# Patient Record
Sex: Female | Born: 1940
Health system: Southern US, Community
[De-identification: ages and names within clinical notes are randomized; demographics above are authoritative.]

## PROBLEM LIST (undated history)

## (undated) DIAGNOSIS — F329 Major depressive disorder, single episode, unspecified: Secondary | ICD-10-CM

## (undated) DIAGNOSIS — I10 Essential (primary) hypertension: Secondary | ICD-10-CM

## (undated) DIAGNOSIS — Z8709 Personal history of other diseases of the respiratory system: Secondary | ICD-10-CM

## (undated) DIAGNOSIS — G4733 Obstructive sleep apnea (adult) (pediatric): Secondary | ICD-10-CM

## (undated) DIAGNOSIS — M545 Low back pain, unspecified: Secondary | ICD-10-CM

## (undated) DIAGNOSIS — Z86018 Personal history of other benign neoplasm: Secondary | ICD-10-CM

## (undated) DIAGNOSIS — J449 Chronic obstructive pulmonary disease, unspecified: Secondary | ICD-10-CM

## (undated) DIAGNOSIS — Z7901 Long term (current) use of anticoagulants: Secondary | ICD-10-CM

## (undated) DIAGNOSIS — F32A Depression, unspecified: Secondary | ICD-10-CM

## (undated) DIAGNOSIS — E039 Hypothyroidism, unspecified: Secondary | ICD-10-CM

## (undated) DIAGNOSIS — I739 Peripheral vascular disease, unspecified: Secondary | ICD-10-CM

## (undated) DIAGNOSIS — K449 Diaphragmatic hernia without obstruction or gangrene: Secondary | ICD-10-CM

## (undated) DIAGNOSIS — G8929 Other chronic pain: Secondary | ICD-10-CM

## (undated) DIAGNOSIS — I714 Abdominal aortic aneurysm, without rupture, unspecified: Secondary | ICD-10-CM

## (undated) DIAGNOSIS — R0600 Dyspnea, unspecified: Secondary | ICD-10-CM

## (undated) DIAGNOSIS — IMO0002 Reserved for concepts with insufficient information to code with codable children: Secondary | ICD-10-CM

## (undated) DIAGNOSIS — IMO0001 Reserved for inherently not codable concepts without codable children: Secondary | ICD-10-CM

## (undated) DIAGNOSIS — R0609 Other forms of dyspnea: Secondary | ICD-10-CM

## (undated) DIAGNOSIS — Z87898 Personal history of other specified conditions: Secondary | ICD-10-CM

## (undated) DIAGNOSIS — C3490 Malignant neoplasm of unspecified part of unspecified bronchus or lung: Secondary | ICD-10-CM

## (undated) DIAGNOSIS — I82409 Acute embolism and thrombosis of unspecified deep veins of unspecified lower extremity: Secondary | ICD-10-CM

## (undated) DIAGNOSIS — M1990 Unspecified osteoarthritis, unspecified site: Secondary | ICD-10-CM

## (undated) DIAGNOSIS — F419 Anxiety disorder, unspecified: Secondary | ICD-10-CM

## (undated) DIAGNOSIS — N838 Other noninflammatory disorders of ovary, fallopian tube and broad ligament: Secondary | ICD-10-CM

## (undated) DIAGNOSIS — K224 Dyskinesia of esophagus: Secondary | ICD-10-CM

## (undated) DIAGNOSIS — E785 Hyperlipidemia, unspecified: Secondary | ICD-10-CM

## (undated) HISTORY — DX: Acute embolism and thrombosis of unspecified deep veins of unspecified lower extremity: I82.409

## (undated) HISTORY — DX: Reserved for inherently not codable concepts without codable children: IMO0001

## (undated) HISTORY — DX: Personal history of other benign neoplasm: Z86.018

## (undated) HISTORY — DX: Personal history of other specified conditions: Z87.898

## (undated) HISTORY — PX: THYROIDECTOMY: SHX17

## (undated) HISTORY — DX: Unspecified osteoarthritis, unspecified site: M19.90

## (undated) HISTORY — DX: Major depressive disorder, single episode, unspecified: F32.9

## (undated) HISTORY — DX: Peripheral vascular disease, unspecified: I73.9

## (undated) HISTORY — DX: Anxiety disorder, unspecified: F41.9

## (undated) HISTORY — DX: Obstructive sleep apnea (adult) (pediatric): G47.33

## (undated) HISTORY — DX: Essential (primary) hypertension: I10

## (undated) HISTORY — DX: Chronic obstructive pulmonary disease, unspecified: J44.9

## (undated) HISTORY — PX: ABDOMINAL AORTIC ANEURYSM REPAIR: SUR1152

## (undated) HISTORY — DX: Long term (current) use of anticoagulants: Z79.01

## (undated) HISTORY — PX: REPLACEMENT TOTAL KNEE: SUR1224

## (undated) HISTORY — DX: Depression, unspecified: F32.A

## (undated) HISTORY — PX: BREAST BIOPSY: SHX20

## (undated) HISTORY — DX: Abdominal aortic aneurysm, without rupture, unspecified: I71.40

## (undated) HISTORY — DX: Diaphragmatic hernia without obstruction or gangrene: K44.9

## (undated) HISTORY — DX: Hypothyroidism, unspecified: E03.9

## (undated) HISTORY — DX: Reserved for concepts with insufficient information to code with codable children: IMO0002

## (undated) HISTORY — DX: Hyperlipidemia, unspecified: E78.5

## (undated) HISTORY — DX: Dyskinesia of esophagus: K22.4

## (undated) HISTORY — DX: Abdominal aortic aneurysm, without rupture: I71.4

## (undated) HISTORY — PX: CATARACT EXTRACTION W/ INTRAOCULAR LENS  IMPLANT, BILATERAL: SHX1307

## (undated) HISTORY — DX: Other noninflammatory disorders of ovary, fallopian tube and broad ligament: N83.8

## (undated) HISTORY — PX: BLADDER SURGERY: SHX569

## (undated) HISTORY — DX: Malignant neoplasm of unspecified part of unspecified bronchus or lung: C34.90

---

## 2002-06-23 DIAGNOSIS — C3490 Malignant neoplasm of unspecified part of unspecified bronchus or lung: Secondary | ICD-10-CM

## 2002-06-23 DIAGNOSIS — I82409 Acute embolism and thrombosis of unspecified deep veins of unspecified lower extremity: Secondary | ICD-10-CM

## 2002-06-23 HISTORY — DX: Malignant neoplasm of unspecified part of unspecified bronchus or lung: C34.90

## 2002-06-23 HISTORY — DX: Acute embolism and thrombosis of unspecified deep veins of unspecified lower extremity: I82.409

## 2002-10-10 ENCOUNTER — Encounter: Payer: Self-pay | Admitting: Urology

## 2002-10-11 ENCOUNTER — Ambulatory Visit (HOSPITAL_COMMUNITY): Admission: RE | Admit: 2002-10-11 | Discharge: 2002-10-11 | Payer: Self-pay | Admitting: Urology

## 2002-12-06 ENCOUNTER — Encounter: Payer: Self-pay | Admitting: Internal Medicine

## 2002-12-06 ENCOUNTER — Ambulatory Visit (HOSPITAL_COMMUNITY): Admission: RE | Admit: 2002-12-06 | Discharge: 2002-12-06 | Payer: Self-pay | Admitting: Internal Medicine

## 2003-04-21 ENCOUNTER — Encounter: Admission: RE | Admit: 2003-04-21 | Discharge: 2003-04-21 | Payer: Self-pay | Admitting: Internal Medicine

## 2003-05-01 ENCOUNTER — Inpatient Hospital Stay (HOSPITAL_COMMUNITY): Admission: RE | Admit: 2003-05-01 | Discharge: 2003-05-05 | Payer: Self-pay | Admitting: Internal Medicine

## 2003-05-03 ENCOUNTER — Encounter (INDEPENDENT_AMBULATORY_CARE_PROVIDER_SITE_OTHER): Payer: Self-pay | Admitting: *Deleted

## 2003-05-03 ENCOUNTER — Encounter (INDEPENDENT_AMBULATORY_CARE_PROVIDER_SITE_OTHER): Payer: Self-pay | Admitting: Specialist

## 2003-05-09 ENCOUNTER — Ambulatory Visit: Admission: RE | Admit: 2003-05-09 | Discharge: 2003-06-19 | Payer: Self-pay | Admitting: Radiation Oncology

## 2003-05-15 ENCOUNTER — Ambulatory Visit (HOSPITAL_COMMUNITY): Admission: RE | Admit: 2003-05-15 | Discharge: 2003-05-15 | Payer: Self-pay | Admitting: Oncology

## 2003-05-23 ENCOUNTER — Encounter: Admission: RE | Admit: 2003-05-23 | Discharge: 2003-05-23 | Payer: Self-pay | Admitting: Thoracic Surgery

## 2003-06-07 ENCOUNTER — Ambulatory Visit (HOSPITAL_COMMUNITY): Admission: RE | Admit: 2003-06-07 | Discharge: 2003-06-07 | Payer: Self-pay | Admitting: Oncology

## 2003-07-12 ENCOUNTER — Ambulatory Visit (HOSPITAL_COMMUNITY): Admission: RE | Admit: 2003-07-12 | Discharge: 2003-07-12 | Payer: Self-pay | Admitting: Oncology

## 2003-08-17 ENCOUNTER — Ambulatory Visit (HOSPITAL_COMMUNITY): Admission: RE | Admit: 2003-08-17 | Discharge: 2003-08-17 | Payer: Self-pay | Admitting: Oncology

## 2003-08-22 ENCOUNTER — Ambulatory Visit: Admission: RE | Admit: 2003-08-22 | Discharge: 2003-10-27 | Payer: Self-pay | Admitting: Radiation Oncology

## 2003-10-17 ENCOUNTER — Ambulatory Visit (HOSPITAL_COMMUNITY): Admission: RE | Admit: 2003-10-17 | Discharge: 2003-10-17 | Payer: Self-pay | Admitting: Oncology

## 2004-01-03 ENCOUNTER — Ambulatory Visit (HOSPITAL_COMMUNITY): Admission: RE | Admit: 2004-01-03 | Discharge: 2004-01-03 | Payer: Self-pay | Admitting: Oncology

## 2004-03-29 ENCOUNTER — Ambulatory Visit (HOSPITAL_COMMUNITY): Admission: RE | Admit: 2004-03-29 | Discharge: 2004-03-29 | Payer: Self-pay | Admitting: Oncology

## 2004-04-02 ENCOUNTER — Ambulatory Visit (HOSPITAL_COMMUNITY): Admission: RE | Admit: 2004-04-02 | Discharge: 2004-04-02 | Payer: Self-pay | Admitting: Family Medicine

## 2004-04-12 ENCOUNTER — Ambulatory Visit (HOSPITAL_COMMUNITY): Admission: RE | Admit: 2004-04-12 | Discharge: 2004-04-12 | Payer: Self-pay | Admitting: Oncology

## 2004-05-14 ENCOUNTER — Ambulatory Visit: Admission: RE | Admit: 2004-05-14 | Discharge: 2004-05-14 | Payer: Self-pay

## 2004-05-30 ENCOUNTER — Ambulatory Visit: Admission: RE | Admit: 2004-05-30 | Discharge: 2004-05-30 | Payer: Self-pay | Admitting: Oncology

## 2004-07-04 ENCOUNTER — Ambulatory Visit: Payer: Self-pay | Admitting: Oncology

## 2004-07-09 ENCOUNTER — Ambulatory Visit (HOSPITAL_COMMUNITY): Admission: RE | Admit: 2004-07-09 | Discharge: 2004-07-09 | Payer: Self-pay | Admitting: Oncology

## 2004-09-17 ENCOUNTER — Ambulatory Visit (HOSPITAL_COMMUNITY): Admission: RE | Admit: 2004-09-17 | Discharge: 2004-09-17 | Payer: Self-pay | Admitting: Oncology

## 2004-09-20 ENCOUNTER — Ambulatory Visit: Payer: Self-pay | Admitting: Oncology

## 2004-12-09 ENCOUNTER — Ambulatory Visit (HOSPITAL_COMMUNITY): Admission: RE | Admit: 2004-12-09 | Discharge: 2004-12-09 | Payer: Self-pay | Admitting: Obstetrics and Gynecology

## 2004-12-13 ENCOUNTER — Ambulatory Visit: Payer: Self-pay | Admitting: Oncology

## 2004-12-20 ENCOUNTER — Ambulatory Visit (HOSPITAL_COMMUNITY): Admission: RE | Admit: 2004-12-20 | Discharge: 2004-12-20 | Payer: Self-pay | Admitting: Oncology

## 2005-01-28 ENCOUNTER — Ambulatory Visit: Admission: RE | Admit: 2005-01-28 | Discharge: 2005-01-28 | Payer: Self-pay | Admitting: Oncology

## 2005-02-01 ENCOUNTER — Emergency Department (HOSPITAL_COMMUNITY): Admission: EM | Admit: 2005-02-01 | Discharge: 2005-02-01 | Payer: Self-pay | Admitting: Family Medicine

## 2005-03-14 ENCOUNTER — Ambulatory Visit: Payer: Self-pay | Admitting: Oncology

## 2005-03-17 ENCOUNTER — Ambulatory Visit: Admission: RE | Admit: 2005-03-17 | Discharge: 2005-03-17 | Payer: Self-pay | Admitting: Oncology

## 2005-07-04 ENCOUNTER — Ambulatory Visit: Payer: Self-pay | Admitting: Oncology

## 2005-07-09 ENCOUNTER — Ambulatory Visit (HOSPITAL_COMMUNITY): Admission: RE | Admit: 2005-07-09 | Discharge: 2005-07-09 | Payer: Self-pay | Admitting: Oncology

## 2005-07-17 ENCOUNTER — Ambulatory Visit (HOSPITAL_COMMUNITY): Admission: RE | Admit: 2005-07-17 | Discharge: 2005-07-17 | Payer: Self-pay | Admitting: Oncology

## 2005-08-04 ENCOUNTER — Encounter: Admission: RE | Admit: 2005-08-04 | Discharge: 2005-08-04 | Payer: Self-pay | Admitting: Oncology

## 2005-08-14 ENCOUNTER — Encounter: Admission: RE | Admit: 2005-08-14 | Discharge: 2005-08-14 | Payer: Self-pay | Admitting: Oncology

## 2005-09-02 ENCOUNTER — Encounter (INDEPENDENT_AMBULATORY_CARE_PROVIDER_SITE_OTHER): Payer: Self-pay | Admitting: *Deleted

## 2005-09-02 ENCOUNTER — Encounter: Admission: RE | Admit: 2005-09-02 | Discharge: 2005-09-02 | Payer: Self-pay | Admitting: Surgery

## 2005-09-02 ENCOUNTER — Other Ambulatory Visit: Admission: RE | Admit: 2005-09-02 | Discharge: 2005-09-02 | Payer: Self-pay | Admitting: Interventional Radiology

## 2006-01-14 ENCOUNTER — Ambulatory Visit: Payer: Self-pay | Admitting: Oncology

## 2006-01-19 LAB — CBC WITH DIFFERENTIAL/PLATELET
BASO%: 0.3 % (ref 0.0–2.0)
Basophils Absolute: 0 10e3/uL (ref 0.0–0.1)
EOS%: 3.5 % (ref 0.0–7.0)
Eosinophils Absolute: 0.1 10e3/uL (ref 0.0–0.5)
HCT: 43.6 % (ref 34.8–46.6)
HGB: 15 g/dL (ref 11.6–15.9)
LYMPH%: 34.1 % (ref 14.0–48.0)
MCH: 35.7 pg — ABNORMAL HIGH (ref 26.0–34.0)
MCHC: 34.3 g/dL (ref 32.0–36.0)
MCV: 103.9 fL — ABNORMAL HIGH (ref 81.0–101.0)
MONO#: 0.5 10e3/uL (ref 0.1–0.9)
MONO%: 11.7 % (ref 0.0–13.0)
NEUT#: 2.1 10e3/uL (ref 1.5–6.5)
NEUT%: 50.4 % (ref 39.6–76.8)
Platelets: 158 10e3/uL (ref 145–400)
RBC: 4.2 10e6/uL (ref 3.70–5.32)
RDW: 12.8 % (ref 11.3–14.5)
WBC: 4.1 10e3/uL (ref 3.9–10.0)
lymph#: 1.4 10e3/uL (ref 0.9–3.3)

## 2006-01-19 LAB — COMPREHENSIVE METABOLIC PANEL WITH GFR
ALT: 24 U/L (ref 0–40)
AST: 25 U/L (ref 0–37)
Albumin: 4 g/dL (ref 3.5–5.2)
Alkaline Phosphatase: 86 U/L (ref 39–117)
BUN: 20 mg/dL (ref 6–23)
CO2: 27 meq/L (ref 19–32)
Calcium: 9.2 mg/dL (ref 8.4–10.5)
Chloride: 104 meq/L (ref 96–112)
Creatinine, Ser: 0.92 mg/dL (ref 0.40–1.20)
Glucose, Bld: 110 mg/dL — ABNORMAL HIGH (ref 70–99)
Potassium: 4.3 meq/L (ref 3.5–5.3)
Sodium: 141 meq/L (ref 135–145)
Total Bilirubin: 0.4 mg/dL (ref 0.3–1.2)
Total Protein: 6.5 g/dL (ref 6.0–8.3)

## 2006-01-22 ENCOUNTER — Ambulatory Visit (HOSPITAL_COMMUNITY): Admission: RE | Admit: 2006-01-22 | Discharge: 2006-01-22 | Payer: Self-pay | Admitting: Oncology

## 2006-02-27 ENCOUNTER — Ambulatory Visit (HOSPITAL_COMMUNITY): Admission: RE | Admit: 2006-02-27 | Discharge: 2006-02-27 | Payer: Self-pay | Admitting: Oncology

## 2006-04-07 ENCOUNTER — Encounter: Admission: RE | Admit: 2006-04-07 | Discharge: 2006-04-07 | Payer: Self-pay | Admitting: Obstetrics and Gynecology

## 2006-04-09 ENCOUNTER — Ambulatory Visit (HOSPITAL_COMMUNITY): Admission: RE | Admit: 2006-04-09 | Discharge: 2006-04-09 | Payer: Self-pay | Admitting: Obstetrics and Gynecology

## 2006-07-08 ENCOUNTER — Encounter: Admission: RE | Admit: 2006-07-08 | Discharge: 2006-07-08 | Payer: Self-pay | Admitting: Endocrinology

## 2006-07-08 ENCOUNTER — Other Ambulatory Visit: Admission: RE | Admit: 2006-07-08 | Discharge: 2006-07-08 | Payer: Self-pay | Admitting: Diagnostic Radiology

## 2006-07-08 ENCOUNTER — Encounter (INDEPENDENT_AMBULATORY_CARE_PROVIDER_SITE_OTHER): Payer: Self-pay | Admitting: *Deleted

## 2006-07-14 ENCOUNTER — Ambulatory Visit: Payer: Self-pay | Admitting: Oncology

## 2006-07-16 LAB — CBC WITH DIFFERENTIAL/PLATELET
BASO%: 0.4 % (ref 0.0–2.0)
Basophils Absolute: 0 10*3/uL (ref 0.0–0.1)
EOS%: 2.9 % (ref 0.0–7.0)
Eosinophils Absolute: 0.2 10*3/uL (ref 0.0–0.5)
HCT: 43.8 % (ref 34.8–46.6)
HGB: 15.2 g/dL (ref 11.6–15.9)
LYMPH%: 28.2 % (ref 14.0–48.0)
MCH: 36.1 pg — ABNORMAL HIGH (ref 26.0–34.0)
MCHC: 34.7 g/dL (ref 32.0–36.0)
MCV: 103.9 fL — ABNORMAL HIGH (ref 81.0–101.0)
MONO#: 0.6 10*3/uL (ref 0.1–0.9)
MONO%: 10.7 % (ref 0.0–13.0)
NEUT#: 3.2 10*3/uL (ref 1.5–6.5)
NEUT%: 57.8 % (ref 39.6–76.8)
Platelets: 170 10*3/uL (ref 145–400)
RBC: 4.21 10*6/uL (ref 3.70–5.32)
RDW: 13.1 % (ref 11.3–14.5)
WBC: 5.5 10*3/uL (ref 3.9–10.0)
lymph#: 1.6 10*3/uL (ref 0.9–3.3)

## 2006-07-16 LAB — COMPREHENSIVE METABOLIC PANEL
ALT: 21 U/L (ref 0–35)
AST: 22 U/L (ref 0–37)
Albumin: 4 g/dL (ref 3.5–5.2)
Alkaline Phosphatase: 97 U/L (ref 39–117)
BUN: 15 mg/dL (ref 6–23)
CO2: 28 mEq/L (ref 19–32)
Calcium: 9 mg/dL (ref 8.4–10.5)
Chloride: 103 mEq/L (ref 96–112)
Creatinine, Ser: 1.1 mg/dL (ref 0.40–1.20)
Glucose, Bld: 107 mg/dL — ABNORMAL HIGH (ref 70–99)
Potassium: 4.1 mEq/L (ref 3.5–5.3)
Sodium: 138 mEq/L (ref 135–145)
Total Bilirubin: 0.3 mg/dL (ref 0.3–1.2)
Total Protein: 6.6 g/dL (ref 6.0–8.3)

## 2006-07-20 ENCOUNTER — Ambulatory Visit (HOSPITAL_COMMUNITY): Admission: RE | Admit: 2006-07-20 | Discharge: 2006-07-20 | Payer: Self-pay | Admitting: Oncology

## 2006-07-28 ENCOUNTER — Inpatient Hospital Stay (HOSPITAL_COMMUNITY): Admission: RE | Admit: 2006-07-28 | Discharge: 2006-08-02 | Payer: Self-pay | Admitting: Orthopedic Surgery

## 2006-08-17 ENCOUNTER — Encounter: Payer: Self-pay | Admitting: Orthopedic Surgery

## 2006-08-22 ENCOUNTER — Encounter: Payer: Self-pay | Admitting: Orthopedic Surgery

## 2006-08-24 ENCOUNTER — Ambulatory Visit: Payer: Self-pay | Admitting: Vascular Surgery

## 2006-09-22 ENCOUNTER — Encounter: Payer: Self-pay | Admitting: Orthopedic Surgery

## 2006-10-07 ENCOUNTER — Encounter: Admission: RE | Admit: 2006-10-07 | Discharge: 2006-10-07 | Payer: Self-pay | Admitting: Orthopedic Surgery

## 2006-10-22 ENCOUNTER — Ambulatory Visit: Payer: Self-pay | Admitting: Internal Medicine

## 2006-11-05 ENCOUNTER — Ambulatory Visit: Payer: Self-pay | Admitting: Internal Medicine

## 2006-11-13 ENCOUNTER — Encounter: Admission: RE | Admit: 2006-11-13 | Discharge: 2006-11-13 | Payer: Self-pay | Admitting: Internal Medicine

## 2007-01-12 ENCOUNTER — Ambulatory Visit: Payer: Self-pay | Admitting: Oncology

## 2007-01-14 LAB — CBC WITH DIFFERENTIAL/PLATELET
BASO%: 0.5 % (ref 0.0–2.0)
Basophils Absolute: 0 10*3/uL (ref 0.0–0.1)
EOS%: 2.6 % (ref 0.0–7.0)
Eosinophils Absolute: 0.1 10*3/uL (ref 0.0–0.5)
HCT: 45.1 % (ref 34.8–46.6)
HGB: 16.2 g/dL — ABNORMAL HIGH (ref 11.6–15.9)
LYMPH%: 31.8 % (ref 14.0–48.0)
MCH: 36 pg — ABNORMAL HIGH (ref 26.0–34.0)
MCHC: 35.8 g/dL (ref 32.0–36.0)
MCV: 100.6 fL (ref 81.0–101.0)
MONO#: 0.7 10*3/uL (ref 0.1–0.9)
MONO%: 13.4 % — ABNORMAL HIGH (ref 0.0–13.0)
NEUT#: 2.6 10*3/uL (ref 1.5–6.5)
NEUT%: 51.7 % (ref 39.6–76.8)
Platelets: 165 10*3/uL (ref 145–400)
RBC: 4.49 10*6/uL (ref 3.70–5.32)
RDW: 12.7 % (ref 11.3–14.5)
WBC: 5.1 10*3/uL (ref 3.9–10.0)
lymph#: 1.6 10*3/uL (ref 0.9–3.3)

## 2007-01-14 LAB — COMPREHENSIVE METABOLIC PANEL
ALT: 22 U/L (ref 0–35)
AST: 25 U/L (ref 0–37)
Albumin: 4 g/dL (ref 3.5–5.2)
Alkaline Phosphatase: 97 U/L (ref 39–117)
BUN: 15 mg/dL (ref 6–23)
CO2: 27 mEq/L (ref 19–32)
Calcium: 9.2 mg/dL (ref 8.4–10.5)
Chloride: 104 mEq/L (ref 96–112)
Creatinine, Ser: 0.87 mg/dL (ref 0.40–1.20)
Glucose, Bld: 86 mg/dL (ref 70–99)
Potassium: 4.3 mEq/L (ref 3.5–5.3)
Sodium: 141 mEq/L (ref 135–145)
Total Bilirubin: 0.5 mg/dL (ref 0.3–1.2)
Total Protein: 6.7 g/dL (ref 6.0–8.3)

## 2007-01-18 ENCOUNTER — Ambulatory Visit (HOSPITAL_COMMUNITY): Admission: RE | Admit: 2007-01-18 | Discharge: 2007-01-18 | Payer: Self-pay | Admitting: Oncology

## 2007-04-20 ENCOUNTER — Encounter: Admission: RE | Admit: 2007-04-20 | Discharge: 2007-04-20 | Payer: Self-pay | Admitting: Orthopedic Surgery

## 2007-09-06 ENCOUNTER — Ambulatory Visit: Payer: Self-pay | Admitting: Oncology

## 2007-09-08 LAB — COMPREHENSIVE METABOLIC PANEL
ALT: 39 U/L — ABNORMAL HIGH (ref 0–35)
AST: 36 U/L (ref 0–37)
Albumin: 3.8 g/dL (ref 3.5–5.2)
Alkaline Phosphatase: 73 U/L (ref 39–117)
BUN: 11 mg/dL (ref 6–23)
CO2: 26 mEq/L (ref 19–32)
Calcium: 9.2 mg/dL (ref 8.4–10.5)
Chloride: 104 mEq/L (ref 96–112)
Creatinine, Ser: 0.8 mg/dL (ref 0.40–1.20)
Glucose, Bld: 103 mg/dL — ABNORMAL HIGH (ref 70–99)
Potassium: 4.1 mEq/L (ref 3.5–5.3)
Sodium: 141 mEq/L (ref 135–145)
Total Bilirubin: 0.4 mg/dL (ref 0.3–1.2)
Total Protein: 6.4 g/dL (ref 6.0–8.3)

## 2007-09-08 LAB — CBC WITH DIFFERENTIAL/PLATELET
BASO%: 1 % (ref 0.0–2.0)
Basophils Absolute: 0.1 10*3/uL (ref 0.0–0.1)
EOS%: 2.2 % (ref 0.0–7.0)
Eosinophils Absolute: 0.1 10*3/uL (ref 0.0–0.5)
HCT: 44.4 % (ref 34.8–46.6)
HGB: 15.6 g/dL (ref 11.6–15.9)
LYMPH%: 31.6 % (ref 14.0–48.0)
MCH: 36.4 pg — ABNORMAL HIGH (ref 26.0–34.0)
MCHC: 35.2 g/dL (ref 32.0–36.0)
MCV: 103.4 fL — ABNORMAL HIGH (ref 81.0–101.0)
MONO#: 0.5 10*3/uL (ref 0.1–0.9)
MONO%: 8.7 % (ref 0.0–13.0)
NEUT#: 3.2 10*3/uL (ref 1.5–6.5)
NEUT%: 56.5 % (ref 39.6–76.8)
Platelets: 150 10*3/uL (ref 145–400)
RBC: 4.29 10*6/uL (ref 3.70–5.32)
RDW: 13.6 % (ref 11.3–14.5)
WBC: 5.7 10*3/uL (ref 3.9–10.0)
lymph#: 1.8 10*3/uL (ref 0.9–3.3)

## 2007-09-10 ENCOUNTER — Ambulatory Visit (HOSPITAL_COMMUNITY): Admission: RE | Admit: 2007-09-10 | Discharge: 2007-09-10 | Payer: Self-pay | Admitting: Oncology

## 2007-09-15 LAB — HEPATIC FUNCTION PANEL
ALT: 32 U/L (ref 0–35)
AST: 33 U/L (ref 0–37)
Albumin: 4.1 g/dL (ref 3.5–5.2)
Alkaline Phosphatase: 74 U/L (ref 39–117)
Bilirubin, Direct: 0.1 mg/dL (ref 0.0–0.3)
Indirect Bilirubin: 0.4 mg/dL (ref 0.0–0.9)
Total Bilirubin: 0.5 mg/dL (ref 0.3–1.2)
Total Protein: 6.6 g/dL (ref 6.0–8.3)

## 2007-09-17 ENCOUNTER — Ambulatory Visit: Payer: Self-pay | Admitting: Vascular Surgery

## 2007-11-16 ENCOUNTER — Encounter: Admission: RE | Admit: 2007-11-16 | Discharge: 2007-11-16 | Payer: Self-pay | Admitting: Internal Medicine

## 2008-02-13 ENCOUNTER — Encounter: Admission: RE | Admit: 2008-02-13 | Discharge: 2008-02-13 | Payer: Self-pay | Admitting: Orthopedic Surgery

## 2008-04-10 ENCOUNTER — Ambulatory Visit: Payer: Self-pay | Admitting: Oncology

## 2008-04-12 LAB — COMPREHENSIVE METABOLIC PANEL
ALT: 28 U/L (ref 0–35)
AST: 25 U/L (ref 0–37)
Albumin: 3.9 g/dL (ref 3.5–5.2)
Alkaline Phosphatase: 81 U/L (ref 39–117)
BUN: 12 mg/dL (ref 6–23)
CO2: 27 mEq/L (ref 19–32)
Calcium: 9.1 mg/dL (ref 8.4–10.5)
Chloride: 105 mEq/L (ref 96–112)
Creatinine, Ser: 0.77 mg/dL (ref 0.40–1.20)
Glucose, Bld: 104 mg/dL — ABNORMAL HIGH (ref 70–99)
Potassium: 4.1 mEq/L (ref 3.5–5.3)
Sodium: 139 mEq/L (ref 135–145)
Total Bilirubin: 0.5 mg/dL (ref 0.3–1.2)
Total Protein: 6.3 g/dL (ref 6.0–8.3)

## 2008-04-12 LAB — CBC WITH DIFFERENTIAL/PLATELET
BASO%: 0.5 % (ref 0.0–2.0)
Basophils Absolute: 0 10*3/uL (ref 0.0–0.1)
EOS%: 1.6 % (ref 0.0–7.0)
Eosinophils Absolute: 0.1 10*3/uL (ref 0.0–0.5)
HCT: 44.5 % (ref 34.8–46.6)
HGB: 15.6 g/dL (ref 11.6–15.9)
LYMPH%: 29.9 % (ref 14.0–48.0)
MCH: 36.5 pg — ABNORMAL HIGH (ref 26.0–34.0)
MCHC: 35.1 g/dL (ref 32.0–36.0)
MCV: 104.2 fL — ABNORMAL HIGH (ref 81.0–101.0)
MONO#: 0.6 10*3/uL (ref 0.1–0.9)
MONO%: 10.8 % (ref 0.0–13.0)
NEUT#: 3.1 10*3/uL (ref 1.5–6.5)
NEUT%: 57.2 % (ref 39.6–76.8)
Platelets: 135 10*3/uL — ABNORMAL LOW (ref 145–400)
RBC: 4.27 10*6/uL (ref 3.70–5.32)
RDW: 13.7 % (ref 11.3–14.5)
WBC: 5.4 10*3/uL (ref 3.9–10.0)
lymph#: 1.6 10*3/uL (ref 0.9–3.3)

## 2008-04-14 ENCOUNTER — Encounter: Payer: Self-pay | Admitting: Internal Medicine

## 2008-04-14 ENCOUNTER — Ambulatory Visit (HOSPITAL_COMMUNITY): Admission: RE | Admit: 2008-04-14 | Discharge: 2008-04-14 | Payer: Self-pay | Admitting: Oncology

## 2008-04-17 ENCOUNTER — Encounter: Admission: RE | Admit: 2008-04-17 | Discharge: 2008-04-17 | Payer: Self-pay | Admitting: Orthopedic Surgery

## 2008-04-25 ENCOUNTER — Ambulatory Visit (HOSPITAL_COMMUNITY): Admission: RE | Admit: 2008-04-25 | Discharge: 2008-04-25 | Payer: Self-pay | Admitting: Oncology

## 2008-08-03 ENCOUNTER — Ambulatory Visit: Payer: Self-pay | Admitting: Internal Medicine

## 2008-08-03 DIAGNOSIS — I5030 Unspecified diastolic (congestive) heart failure: Secondary | ICD-10-CM | POA: Insufficient documentation

## 2008-08-09 ENCOUNTER — Ambulatory Visit: Payer: Self-pay | Admitting: Internal Medicine

## 2008-08-09 ENCOUNTER — Telehealth (INDEPENDENT_AMBULATORY_CARE_PROVIDER_SITE_OTHER): Payer: Self-pay | Admitting: *Deleted

## 2008-08-19 DIAGNOSIS — I82409 Acute embolism and thrombosis of unspecified deep veins of unspecified lower extremity: Secondary | ICD-10-CM | POA: Insufficient documentation

## 2008-08-19 DIAGNOSIS — J449 Chronic obstructive pulmonary disease, unspecified: Secondary | ICD-10-CM | POA: Insufficient documentation

## 2008-08-19 DIAGNOSIS — Z86718 Personal history of other venous thrombosis and embolism: Secondary | ICD-10-CM | POA: Insufficient documentation

## 2008-08-19 DIAGNOSIS — Z85118 Personal history of other malignant neoplasm of bronchus and lung: Secondary | ICD-10-CM | POA: Insufficient documentation

## 2008-09-08 ENCOUNTER — Ambulatory Visit: Payer: Self-pay | Admitting: Internal Medicine

## 2008-09-17 ENCOUNTER — Encounter: Payer: Self-pay | Admitting: Internal Medicine

## 2008-09-19 ENCOUNTER — Encounter: Payer: Self-pay | Admitting: Internal Medicine

## 2008-09-19 ENCOUNTER — Ambulatory Visit: Payer: Self-pay

## 2008-09-24 ENCOUNTER — Encounter: Payer: Self-pay | Admitting: Internal Medicine

## 2008-09-29 ENCOUNTER — Telehealth (INDEPENDENT_AMBULATORY_CARE_PROVIDER_SITE_OTHER): Payer: Self-pay | Admitting: *Deleted

## 2008-10-13 ENCOUNTER — Encounter: Payer: Self-pay | Admitting: Internal Medicine

## 2008-10-16 ENCOUNTER — Ambulatory Visit: Payer: Self-pay | Admitting: Internal Medicine

## 2008-10-16 DIAGNOSIS — G4733 Obstructive sleep apnea (adult) (pediatric): Secondary | ICD-10-CM | POA: Insufficient documentation

## 2008-11-11 ENCOUNTER — Encounter: Payer: Self-pay | Admitting: Internal Medicine

## 2008-11-11 ENCOUNTER — Ambulatory Visit (HOSPITAL_BASED_OUTPATIENT_CLINIC_OR_DEPARTMENT_OTHER): Admission: RE | Admit: 2008-11-11 | Discharge: 2008-11-11 | Payer: Self-pay | Admitting: Internal Medicine

## 2008-11-17 ENCOUNTER — Ambulatory Visit: Payer: Self-pay | Admitting: Internal Medicine

## 2008-11-28 ENCOUNTER — Ambulatory Visit: Payer: Self-pay | Admitting: Internal Medicine

## 2008-11-29 ENCOUNTER — Encounter: Payer: Self-pay | Admitting: Internal Medicine

## 2008-12-04 ENCOUNTER — Encounter: Admission: RE | Admit: 2008-12-04 | Discharge: 2008-12-04 | Payer: Self-pay | Admitting: Internal Medicine

## 2009-01-06 ENCOUNTER — Encounter: Payer: Self-pay | Admitting: Internal Medicine

## 2009-01-06 ENCOUNTER — Ambulatory Visit (HOSPITAL_BASED_OUTPATIENT_CLINIC_OR_DEPARTMENT_OTHER): Admission: RE | Admit: 2009-01-06 | Discharge: 2009-01-06 | Payer: Self-pay | Admitting: Internal Medicine

## 2009-01-18 ENCOUNTER — Ambulatory Visit: Payer: Self-pay | Admitting: Internal Medicine

## 2009-01-31 ENCOUNTER — Encounter: Payer: Self-pay | Admitting: Internal Medicine

## 2009-02-07 ENCOUNTER — Ambulatory Visit: Payer: Self-pay | Admitting: Vascular Surgery

## 2009-02-16 ENCOUNTER — Ambulatory Visit: Payer: Self-pay | Admitting: Vascular Surgery

## 2009-02-23 ENCOUNTER — Ambulatory Visit: Payer: Self-pay | Admitting: Vascular Surgery

## 2009-02-23 ENCOUNTER — Encounter: Admission: RE | Admit: 2009-02-23 | Discharge: 2009-02-23 | Payer: Self-pay | Admitting: Vascular Surgery

## 2009-02-26 ENCOUNTER — Encounter: Payer: Self-pay | Admitting: Internal Medicine

## 2009-03-11 ENCOUNTER — Telehealth: Payer: Self-pay | Admitting: Internal Medicine

## 2009-03-15 ENCOUNTER — Telehealth: Payer: Self-pay | Admitting: Internal Medicine

## 2009-03-23 ENCOUNTER — Encounter: Admission: RE | Admit: 2009-03-23 | Discharge: 2009-03-23 | Payer: Self-pay | Admitting: Obstetrics and Gynecology

## 2009-03-26 ENCOUNTER — Inpatient Hospital Stay (HOSPITAL_COMMUNITY): Admission: RE | Admit: 2009-03-26 | Discharge: 2009-03-28 | Payer: Self-pay | Admitting: Vascular Surgery

## 2009-03-26 ENCOUNTER — Ambulatory Visit: Payer: Self-pay | Admitting: Vascular Surgery

## 2009-04-11 ENCOUNTER — Ambulatory Visit: Payer: Self-pay | Admitting: Vascular Surgery

## 2009-04-17 ENCOUNTER — Ambulatory Visit: Payer: Self-pay | Admitting: Internal Medicine

## 2009-04-20 ENCOUNTER — Ambulatory Visit: Payer: Self-pay | Admitting: Vascular Surgery

## 2009-04-20 ENCOUNTER — Encounter: Admission: RE | Admit: 2009-04-20 | Discharge: 2009-04-20 | Payer: Self-pay | Admitting: Vascular Surgery

## 2009-05-11 ENCOUNTER — Ambulatory Visit: Payer: Self-pay | Admitting: Vascular Surgery

## 2009-05-17 ENCOUNTER — Encounter: Payer: Self-pay | Admitting: Internal Medicine

## 2009-05-17 ENCOUNTER — Inpatient Hospital Stay (HOSPITAL_COMMUNITY): Admission: EM | Admit: 2009-05-17 | Discharge: 2009-05-22 | Payer: Self-pay | Admitting: Emergency Medicine

## 2009-05-18 ENCOUNTER — Ambulatory Visit: Payer: Self-pay | Admitting: Surgery

## 2009-05-18 ENCOUNTER — Encounter: Payer: Self-pay | Admitting: Surgery

## 2009-05-29 ENCOUNTER — Inpatient Hospital Stay (HOSPITAL_COMMUNITY): Admission: AD | Admit: 2009-05-29 | Discharge: 2009-06-04 | Payer: Self-pay | Admitting: Surgery

## 2009-05-29 ENCOUNTER — Ambulatory Visit: Payer: Self-pay | Admitting: Vascular Surgery

## 2009-06-29 ENCOUNTER — Ambulatory Visit: Payer: Self-pay | Admitting: Vascular Surgery

## 2009-07-20 ENCOUNTER — Ambulatory Visit: Payer: Self-pay | Admitting: Vascular Surgery

## 2009-07-23 ENCOUNTER — Encounter: Payer: Self-pay | Admitting: Internal Medicine

## 2009-07-26 ENCOUNTER — Encounter: Payer: Self-pay | Admitting: Internal Medicine

## 2009-08-13 ENCOUNTER — Telehealth: Payer: Self-pay | Admitting: Internal Medicine

## 2009-08-17 ENCOUNTER — Ambulatory Visit: Payer: Self-pay | Admitting: Vascular Surgery

## 2009-10-15 ENCOUNTER — Ambulatory Visit: Payer: Self-pay | Admitting: Internal Medicine

## 2009-10-30 ENCOUNTER — Ambulatory Visit: Payer: Self-pay | Admitting: Vascular Surgery

## 2009-11-06 ENCOUNTER — Ambulatory Visit: Payer: Self-pay | Admitting: Vascular Surgery

## 2009-11-16 ENCOUNTER — Encounter: Admission: RE | Admit: 2009-11-16 | Discharge: 2009-11-16 | Payer: Self-pay | Admitting: Vascular Surgery

## 2009-11-16 ENCOUNTER — Ambulatory Visit: Payer: Self-pay | Admitting: Vascular Surgery

## 2009-12-07 ENCOUNTER — Encounter: Admission: RE | Admit: 2009-12-07 | Discharge: 2009-12-07 | Payer: Self-pay | Admitting: Obstetrics and Gynecology

## 2009-12-20 ENCOUNTER — Encounter: Payer: Self-pay | Admitting: Internal Medicine

## 2010-02-10 ENCOUNTER — Encounter: Payer: Self-pay | Admitting: Internal Medicine

## 2010-03-26 ENCOUNTER — Encounter: Admission: RE | Admit: 2010-03-26 | Discharge: 2010-03-26 | Payer: Self-pay | Admitting: Neurological Surgery

## 2010-04-16 ENCOUNTER — Ambulatory Visit: Payer: Self-pay | Admitting: Internal Medicine

## 2010-05-21 ENCOUNTER — Encounter: Admission: RE | Admit: 2010-05-21 | Discharge: 2010-05-21 | Payer: Self-pay | Admitting: Vascular Surgery

## 2010-05-21 ENCOUNTER — Ambulatory Visit: Payer: Self-pay | Admitting: Vascular Surgery

## 2010-06-23 DIAGNOSIS — IMO0001 Reserved for inherently not codable concepts without codable children: Secondary | ICD-10-CM

## 2010-06-23 HISTORY — DX: Reserved for inherently not codable concepts without codable children: IMO0001

## 2010-06-23 HISTORY — PX: CARDIOVASCULAR STRESS TEST: SHX262

## 2010-07-13 ENCOUNTER — Encounter: Payer: Self-pay | Admitting: Internal Medicine

## 2010-07-13 ENCOUNTER — Encounter: Payer: Self-pay | Admitting: Oncology

## 2010-07-14 ENCOUNTER — Encounter: Payer: Self-pay | Admitting: Oncology

## 2010-07-23 NOTE — Progress Notes (Signed)
Summary: rx  Phone Note Call from Patient Call back at Home Phone [phone]   Caller: Patient Call For: young Reason for Call: Talk to Nurse Summary of Call: pt seen for PFT, stated she needs rx for Spiriva called in to Pharmacy. [place] [phone] Initial call taken by: Eugene Gavia,  August 09, 2008 11:40 AM  Follow-up for Phone Call        ok to send RX. Reynaldo Minium CMA  August 09, 2008 11:50 AM   Sent rx to pharmacy.  Attempted to notify pt.  LMOM rx sent to pharmacy as requested. Follow-up by: Cloyde Reams RN,  August 09, 2008 11:57 AM    New/Updated Medications: SPIRIVA HANDIHALER 18 MCG  CAPS (TIOTROPIUM BROMIDE MONOHYDRATE) Two puffs in handihaler daily   Prescriptions: SPIRIVA HANDIHALER 18 MCG  CAPS (TIOTROPIUM BROMIDE MONOHYDRATE) Two puffs in handihaler daily  #30 x 5   Entered by:   Cloyde Reams RN   Authorized by:   Waymon Budge MD   Signed by:   Cloyde Reams RN on 08/09/2008   Method used:   Electronically to        [facility]* (retail)       [address]       [place], [place]  [address]       Ph: [phone]       Fax: [phone]   RxID:   [id number]

## 2010-07-23 NOTE — Progress Notes (Signed)
Summary: PRESCRIPT  Phone Note Call from Patient   Caller: Patient Call For: Jamille Fisher Summary of Call: NEED REFILL FOR TEMAZEPAM [place] [phone] Initial call taken by: Rickard Patience,  August 13, 2009 2:42 PM  Follow-up for Phone Call        pt last saw CY 04-17-09.  Last had Temazepam refilled 01-18-2009 for # 90 x 3 refills.  Please advise if ok to refill or not.  Thanks.  Aundra Millet Reynolds LPN  August 13, 2009 4:03 PM   Additional Follow-up for Phone Call Additional follow up Details #1::        Ok to refill temazepam for 90 das x 3 like last time. Additional Follow-up by: Waymon Budge MD,  August 13, 2009 5:17 PM    Additional Follow-up for Phone Call Additional follow up Details #2::    refill sent back to pharmacy and pt is aware. Randell Loop CMA  August 13, 2009 5:22 PM   Prescriptions: TEMAZEPAM 15 MG CAPS (TEMAZEPAM) 1 at bedtime as needed sleep  #90 x 3   Entered by:   Randell Loop CMA   Authorized by:   Waymon Budge MD   Signed by:   Randell Loop CMA on 08/13/2009   Method used:   Telephoned to ...       [facility]* (retail)       [address]       [place], [place]  [address]       Ph: [phone]       Fax: [phone]   RxID:   [id number]

## 2010-07-23 NOTE — Progress Notes (Signed)
Summary: cpap pressure-lmtcb x 1  Phone Note Call from Patient Call back at Home Phone [phone] Call back at [phone]   Caller: Patient Call For: Idona Stach Reason for Call: Talk to Nurse Summary of Call: Patient said her cpap was increased to 14 and she said that the pressure about blew the mask off her face.  Requesting to speak to nurse. Initial call taken by: Lehman Prom,  March 15, 2009 12:31 PM  Follow-up for Phone Call        [email or web address] Vernie Murders  March 15, 2009 3:02 PM  Pt says she is unable to keep her CPAP mask in place at the higher pressure. It is blowing her cheeks out and she has tightened her mask, but it is not helping. She also wants CDY to know that she is scheduled for surgery on 03/26/09. Dr. Tawanna Cooler Early will be doing a stent graft for the aortic aneurysm and she was told she will not be able to wear the CPAP. Please advise on pt's current CPAP pressure and usage. Pt's cell number is [phone].    Follow-up by: Michel Bickers CMA,  March 16, 2009 9:28 AM  Additional Follow-up for Phone Call Additional follow up Details #1::        1) I have sent [facility] an order to have cpap reduced to 12.  2) People cannot take their home cpap machines to the hospital for safety reasons, but usually can take their own mask, and she can tell the Respiratory therapist, nurse and anesthesiologist that her pressure is 12, using cpap for sleep. Once she is stablized and extubated after surgery, I think Dr Arbie Cookey will want her back on cpap. Additional Follow-up by: Waymon Budge MD,  March 18, 2009 4:13 PM    Additional Follow-up for Phone Call Additional follow up Details #2::    Pt informed. Zackery Barefoot CMA  March 19, 2009 9:15 AM   New/Updated Medications: * CPAP 12 CWP ADVANCED. HAS HAD O2 ADDED 2 L/M

## 2010-07-23 NOTE — Assessment & Plan Note (Signed)
Summary: Pulm stress test-6 min walk  Nurse Visit   Vital Signs:  Patient Profile:   70 Years Old Female Pulse rate:   89 / minute BP sitting:   140 / 82                 Prior Medications: LEVOTHYROXINE SODIUM 100 MCG TABS (LEVOTHYROXINE SODIUM) Take 1 tablet by mouth once a day SIMVASTATIN 20 MG TABS (SIMVASTATIN) take one tablet by mouth at bedtime FLUOXETINE HCL 20 MG TABS (FLUOXETINE HCL) Take 1 tablet by mouth once a day FUROSEMIDE 40 MG TABS (FUROSEMIDE) Take 1 tablet by mouth once a day MELOXICAM 7.5 MG TABS (MELOXICAM) take 2 tablets by mouth once daily ASPIRIN ADULT LOW STRENGTH 81 MG TBEC (ASPIRIN) Take 1 tablet by mouth once a day CALCIUM 600/VITAMIN D 600-400 MG-UNIT TABS (CALCIUM CARBONATE-VITAMIN D) take 2 tablets by mouth once daily FISH OIL 1000 MG CAPS (OMEGA-3 FATTY ACIDS) Take 1 tablet by mouth once a day DARVOCET-N 100 100-650 MG TABS (PROPOXYPHENE N-APAP) as needed for back pain     Orders Added: 1)  Pulmonary Stress (6 min walk) [place]    ]  Six Minute Walk Test Medications taken before test(dose and time): LEVOTHYROXINE 100 micrograms at 7:30 am ASPIRIN 81mg  at 7:30 Supplemental oxygen during the test: No  Lap counter(place a tick mark inside a square for each lap completed) lap 1 complete  lap 2 complete   lap 3 complete   lap 4 complete  lap 5 complete   Baseline  BP sitting: 140/ 82 Heart rate: 89 Dyspnea ( Borg scale) 3 Fatigue (Borg scale) 0 SPO2 93  End Of Test  BP sitting: 142/ 84 Heart rate: 118 Dyspnea ( Borg scale) 3 Fatigue (Borg scale) 0 SPO2 90  2 Minutes post  BP sitting: 140/ 82 Heart rate: 92 SPO2 95  Stopped or paused before six minutes? Yes Reason: stopped w/ 3:13 remaining- SOB- began again- stopped w/ 2:35 remaining- SOB- began again then stopped w/ 1 min remaining.  Interpretation: Number of laps  5 X 48 meters =   240 meters =    240 meters   Total distance walked in six minutes: 240 meters  Tech  ID: Tivis Ringer (August 09, 2008 10:54 AM) Jeremy Johann Comments pt did not complete test. several rest breaks for SOB. no other complaints.

## 2010-07-23 NOTE — Letter (Signed)
Summary: [facility] for [facility] Supplies/[facility]  [facility] for [facility] Supplies/[facility]   Imported By: Sherian Rein 07/26/2009 13:25:45  _____________________________________________________________________  External Attachment:    Type:   Image     Comment:   External Document

## 2010-07-23 NOTE — Assessment & Plan Note (Signed)
Summary: DYSPNEA///kp   Visit Type:  IME Initial Referred by:  Dr Jacky Kindle PCP:  R. Jacky Kindle  Chief Complaint:  dypnea consult after lung cancer/feels symbicort does not help.  History of Present Illness: 08/03/08- New pulmonary consult for Dr Jacky Kindle with complaint of dyspnea. 67yoF, cousin of patient here. Says she is more dyspneic than she thinks she should be. Was blamed on her weight, but sees others who look heavier. At time of w/u for lung cancer 2004, she was told at [facility] that she had "emphysema".Treated then with XRT and chemo. Also dx'd then with DVT bilaterally, but never told she had PE. Never anemic. Has felt shortness of breath since then. Occasional wheeze, little cough, no routine phlegm or chest pains. Paces herself to do housework- mainly dyspnea with exertion. Sleeps on 1 pillow, nocturia x 1-2. Comfortable sleeping and at rest. symbicort trial- no effect. Unsure about any hx of pneumonia. Did have pneumovax 2008, both flu vax this year. Quit smoking (40pk/yr) 2004. Denies cardiovascular disease.   Past Medical History:    Reviewed history and no changes required:              COPD (ICD-496)       DYSPNEA ON EXERTION (ICD-786.09)       Hx of DVT (ICD-453.40)       NEOPLASM, MALIGNANT, LUNG, HX OF (ICD-V10.11)         Past Surgical History:    Reviewed history and no changes required:       thyroidectomy       Aortic aneurysm       Right supraclavicular node biopsy       Left knee replacement  Family History:    Reviewed history and no changes required:       heart disease- mother, brother       cancer- brother       allergies- sister  Social History:    Reviewed history and no changes required:       Patient states former smoker. 1 ppd x 40 yrs, quit 2004       Divorced, children, lives alone       Retired Product/process development scientist   Risk Factors:  Tobacco use:  quit  Review of Systems      See HPI       The patient complains of dyspnea on exertion.  The  patient denies anorexia, fever, vision loss, decreased hearing, hoarseness, chest pain, syncope, prolonged cough, headaches, hemoptysis, abdominal pain, melena, hematochezia, severe indigestion/heartburn, hematuria, incontinence, muscle weakness, suspicious skin lesions, transient blindness, abnormal bleeding, enlarged lymph nodes, and angioedema.         tachypalpitation, no chest pains. Steady weight gain since quit cigs. Feet swell. Occasioanl heartburn. Arthralgias- back and knees.  Vital Signs:  Patient Profile:   70 Years Old Female Weight:      248.38 pounds O2 Sat:      95 % O2 treatment:    Room Air Pulse rate:   86 / minute BP sitting:   148 / 92  (right arm) Cuff size:   regular  Vitals Entered By: Marijo File CMA (August 03, 2008 11:13 AM)             Comments Medications reviewed with patient  Marijo File CMA  August 03, 2008 11:14 AM     Physical Exam  General: A/Ox3; pleasant and cooperative, NAD, obese elderly woman SKIN: no rash, lesions NODES: no lymphadenopathy HEENT: [place]/AT, EOM- WNL,  Conjuctivae- clear, PERRLA, TM-WNL, Nose- mucus, Throat- red without drainage, exudate or stridor. Dentures NECK: Supple w/ fair ROM, JVD- none, normal carotid impulses w/o bruits Thyroid- normal to palpation CHEST: Clear to P&A, diminished HEART: RRR, no m/g/r heard ABDOMEN: Soft and nl; nml bowel sounds; no organomegaly or masses noted ZOX:WRUE, nl pulses, No cyanosis or clubbing. Heavy legswith trace edema. NEURO: Grossly intact to observation      Impression & Recommendations:  Problem # 1:  DYSPNEA ON EXERTION (ICD-786.09) Dyspnea is affecting day to day activity like housework, shopping or fast walking. I don't get hx suggesting anemia or cardiac component. Contributing factors include COPD from 40 Pkyr smoking, obesity with deconditioning, and probably some lung loss from XRT/Chemo.. We will start by asessing PFT with walk test. Therapeutic trial of Spiriva  instead of Symbicort which hasn't made much difference. I reviewed CD of Chest CT showing emphysema, stable 3mm RUL nodule, question of thickening esophageal wall/ esophagitis- for Dr Jacky Kindle to eval. Findings discussed with her. Her updated medication list for this problem includes:    Furosemide 40 Mg Tabs (Furosemide) .Marland Kitchen... Take 1 tablet by mouth once a day   Medications Added to Medication List This Visit: 1)  Levothyroxine Sodium 100 Mcg Tabs (Levothyroxine sodium) .... Take 1 tablet by mouth once a day 2)  Simvastatin 20 Mg Tabs (Simvastatin) .... Take one tablet by mouth at bedtime 3)  Fluoxetine Hcl 20 Mg Tabs (Fluoxetine hcl) .... Take 1 tablet by mouth once a day 4)  Furosemide 40 Mg Tabs (Furosemide) .... Take 1 tablet by mouth once a day 5)  Meloxicam 7.5 Mg Tabs (Meloxicam) .... Take 2 tablets by mouth once daily 6)  Aspirin Adult Low Strength 81 Mg Tbec (Aspirin) .... Take 1 tablet by mouth once a day 7)  Calcium 600/vitamin D 600-400 Mg-unit Tabs (Calcium carbonate-vitamin d) .... Take 2 tablets by mouth once daily 8)  Fish Oil 1000 Mg Caps (Omega-3 fatty acids) .... Take 1 tablet by mouth once a day 9)  Darvocet-n 100 100-650 Mg Tabs (Propoxyphene n-apap) .... As needed for back pain  Patient Instructions: 1)  Please schedule a follow-up appointment in 1 month. 2)  Schedule PFT 3)  Try sample Spiriva once daily for breathing. You can try using this instead of Symbicort to see which, if either, works best. 4)  cc. Dr Jacky Kindle- to f/u CT comments "query esophagitis"

## 2010-07-23 NOTE — Progress Notes (Signed)
Summary: o2 questions  Phone Note Call from Patient Call back at Home Phone [phone]   Caller: Patient Call For: young Summary of Call: pt has questions re: o2. wants to know if she can go w/ out using tank overnight as she stays with her mom every fri night and tank is too heavy to "lug around".  says she never heard back re: overnight oximetry test.  Initial call taken by: Tivis Ringer,  September 29, 2008 11:20 AM  Follow-up for Phone Call        Spoke with pt and explained to her the importance of using O2 at night as prescribed , advised her to talk to [facility] which [facility] and get there assistance in providing a tank for her to use at her mothers house . She has an appt with Dr. Maple Hudson on 4/26 Renold Genta RCP, LPN  September 30, 979 12:15 PM

## 2010-07-23 NOTE — Progress Notes (Signed)
Summary: CPAP changed to 14, continuing O2 2L/M  Phone Note Other Incoming   Summary of Call: Homecare company did autotitration of CPAP- 14 cwp with good compliance, fair control at AHI 6.1. We will set fixed cpap for now at 14.    New/Updated Medications: * CPAP 14 CWP ADVANCED. HAS HAD O2 ADDED 2 L/M

## 2010-07-23 NOTE — Assessment & Plan Note (Signed)
Summary: ROV/FACE TO FACE/APC   Copy to:  Dr Jacky Kindle Primary Provider/Referring Provider:  R. Jacky Kindle  CC:  follow up visit.  History of Present Illness: 11/28/08- Dyspnea, Hx lung Ca, Hx DVT Did not fall asleep for sleep study at all- - attributes to Dr Jacky Kindle having recently changed antidepressant. She had to go to bathroom several times and did not take her temazepam. She uses oxygen at night at 2 L/ She would like to try again. Denies daytime sleepiness. Doesn't notice change attributable to citalopram so far. Using  temazepam most nights. 72 yo mentally challenged granddaughter sleeps in same bed because of seizure hx, but will be away for the summer.  01/17/09- Dyspnea, Hx Lung cancer, Hx DVT Repeat NPSG went much better with supplemental oxygen plus temazepam 30 mg, which permitted more sustained sleep and  confirmed dx moderate obstructive sleep apnea- AHI 23.9/hr. we again reviewed symptoms and management options.  April 17, 2009- " I'm doing pretty good". We autotitrated cpap to 14, with oxygen 2L. She doesn't take it with her when she goes to stay with her mother 1 day/ week, but she does have oxygen there. She depends on temazepam for insomnia and she denies excessive sedation or recognized problems. Spiriva is too expensive to use every day.   Current Medications (verified): 1)  Levothyroxine Sodium 100 Mcg Tabs (Levothyroxine Sodium) .... Take 1 Tablet By Mouth Once A Day 2)  Simvastatin 20 Mg Tabs (Simvastatin) .... On Hold Take One Tablet By Mouth At Bedtime 3)  Fluoxetine Hcl 20 Mg Tabs (Fluoxetine Hcl) .... On Hold Take 1 Tablet By Mouth Once A Day 4)  Furosemide 40 Mg Tabs (Furosemide) .... Take 1 Tablet By Mouth Once A Day 5)  Meloxicam 7.5 Mg Tabs (Meloxicam) .... Take 2 Tablets By Mouth Once Daily 6)  Aspirin Adult Low Strength 81 Mg Tbec (Aspirin) .... Take 1 Tablet By Mouth Once A Day 7)  Calcium 600/vitamin D 600-400 Mg-Unit Tabs (Calcium Carbonate-Vitamin D)  .... Take 2 Tablets By Mouth Once Daily 8)  Fish Oil 1000 Mg Caps (Omega-3 Fatty Acids) .... Take 1 Tablet By Mouth Once A Day 9)  Darvocet-N 100 100-650 Mg Tabs (Propoxyphene N-Apap) .... As Needed For Back Pain 10)  Spiriva Handihaler 18 Mcg  Caps (Tiotropium Bromide Monohydrate) .... Two Puffs in Handihaler Daily 11)  Temazepam 15 Mg Caps (Temazepam) .Marland Kitchen.. 1 At Bedtime As Needed Sleep 12)  Citalopram Hydrobromide 20 Mg Tabs (Citalopram Hydrobromide) .... Once Daily 13)  Cpap 12 Cwp [facility]. Has Had O2 Added 2 L/m  Allergies (verified): No Known Drug Allergies  Past History:  Past Medical History: Last updated: 01/18/2009 Obstructive sleep apnea- NPSG 01/06/09- 23.9/hr COPD (ICD-496) DYSPNEA ON EXERTION (ICD-786.09) Hx of DVT (ICD-453.40) NEOPLASM, MALIGNANT, LUNG, HX OF (ICD-V10.11)  Family History: Last updated: 08/03/2008 heart disease- mother, brother cancer- brother allergies- sister  Social History: Last updated: 08/03/2008 Patient states former smoker. 1 ppd x 40 yrs, quit 2004 Divorced, children, lives alone Retired Product/process development scientist  Risk Factors: Smoking Status: quit (08/03/2008)  Past Surgical History: thyroidectomy Aortic aneurysm stent 2010 Right supraclavicular node biopsy Left knee replacement  Review of Systems      See HPI       The patient complains of dyspnea on exertion and peripheral edema.  The patient denies anorexia, fever, weight loss, weight gain, vision loss, decreased hearing, hoarseness, chest pain, syncope, prolonged cough, headaches, hemoptysis, abdominal pain, melena, hematochezia, and severe indigestion/heartburn.  Vital Signs:  Patient profile:   70 year old female Height:      62 inches Weight:      252 pounds BMI:     46.26 O2 Sat:      94 % on Room air Pulse rate:   49 / minute BP sitting:   118 / 78  (left arm) Cuff size:   large  Vitals Entered By: Reynaldo Minium CMA (April 17, 2009 11:21 AM)  O2 Flow:  Room  air  Physical Exam  Additional Exam:  General: A/Ox3; pleasant and cooperative, NAD, obese elderly woman, uses rolling walker. Room air sat 94% SKIN: no rash, lesions NODES: no lymphadenopathy HEENT: [place]/AT, EOM- WNL, Conjuctivae- clear, . Dentures, [place]atti II-III NECK: Supple w/ fair ROM, JVD- none, normal carotid impulses w/o bruits Thyroid-  CHEST: Clear to P&A, diminished, unlabored , no rales or cough HEART  RR, no m/g/r ABDOMEN:-obese CWC:BJSE, nl pulses, No cyanosis or clubbing. Heavy legs with trace edema. NEURO: Grossly intact to observation, has rolling walker      Impression & Recommendations:  Problem # 1:  SLEEP APNEA, OBSTRUCTIVE (ICD-327.23)  There is a tradeoff between logistics of travel to stay with mother each week and her use of CPAP, but she does have oxygen at both places. I encouraged her to consider ways to get her cpap to that home when she goes.   Problem # 2:  COPD (ICD-496) She used a left over dose of Symbicort and would like to have new script for this winter,. had flu vax.  Medications Added to Medication List This Visit: 1)  Symbicort 80-4.5 Mcg/act Aero (Budesonide-formoterol fumarate) .... 2 puffs and rinse, twice daily  Other Orders: Est. Patient Level III (83151)  Patient Instructions: 1)  Please schedule a follow-up appointment in 6 months. 2)  Cancel the December appt 3)  Script for Symbicort was sent Prescriptions: SYMBICORT 80-4.5 MCG/ACT AERO (BUDESONIDE-FORMOTEROL FUMARATE) 2 puffs and rinse, twice daily  #1 x prn   Entered and Authorized by:   Waymon Budge MD   Signed by:   Waymon Budge MD on 04/17/2009   Method used:   Electronically to        [facility]* (retail)       [address]       [place], [place]  [address]       Ph: [phone]       Fax: [phone]   RxID:   [id number]

## 2010-07-23 NOTE — Medication Information (Signed)
Summary: [facility]  [facility]   Imported By: Lester Allegheny 11/30/2008 10:01:08  _____________________________________________________________________  External Attachment:    Type:   Image     Comment:   External Document

## 2010-07-23 NOTE — Consult Note (Signed)
[facility]: [facility]   Imported By: Sherian Rein 05/28/2009 07:38:44  _____________________________________________________________________  External Attachment:    Type:   Image     Comment:   External Document

## 2010-07-23 NOTE — Assessment & Plan Note (Signed)
Summary: f/u after sleep study (per pt) ///kp   Copy to:  Dr Jacky Kindle Primary Provider/Referring Provider:  R. Jacky Kindle  CC:  1 month followup after reapeat sleep study.  .  History of Present Illness: History of Present Illness: 10/16/08- Dyspnea, Hx lung ca, Hx DVT No better but not changed by pollen. Denies chest pain, palpitation, cough or wheeze, leg pain or tightness. Nocturia x 3 fragments her sleep and she can't easily get back to sleep. Oxygen dries her nose. She hasn't been using the humidifier on her concentrator. Wakes occasionally wheezing. Sleeps sharing bed with a special needs 19yo granddaughter who is not able to report on sleep quality. ONOX doocumented significant desat on room air. [facility] has now brought O2 for sleep at 2 L/M. maybe she sleeps a little better. ECHO- WNL w/ EF 60%, Right chambers high normal size.  11/28/08- Dyspnea, Hx lung Ca, Hx DVT Did not fall asleep for sleep study at all- - attributes to Dr Jacky Kindle having recently changed antidepressant. She had to go to bathroom several times and did not take her temazepam. She uses oxygen at night at 2 L/ She would like to try again. Denies daytime sleepiness. Doesn't notice change attributable to citalopram so far. Using  temazepam most nights. 72 yo mentally challenged granddaughter sleeps in same bed because of seizure hx, but will be away for the summer.  01/17/09- Dyspnea, Hx Lung cancer, Hx DVT Repeat NPSG went much better with supplemental oxygen plus temazepam 30 mg, which permitted more sustained sleep and  confirmed dx moderate obstructive sleep apnea- AHI 23.9/hr. we again reviewed symptoms and management options.   Allergies (verified): No Known Drug Allergies  Past History:  Past Surgical History: Last updated: 08/03/2008 thyroidectomy Aortic aneurysm Right supraclavicular node biopsy Left knee replacement  Family History: Last updated: 08/03/2008 heart disease- mother, brother cancer-  brother allergies- sister  Social History: Last updated: 08/03/2008 Patient states former smoker. 1 ppd x 40 yrs, quit 2004 Divorced, children, lives alone Retired Product/process development scientist  Risk Factors: Smoking Status: quit (08/03/2008)  Past Medical History: Obstructive sleep apnea- NPSG 01/06/09- 23.9/hr COPD (ICD-496) DYSPNEA ON EXERTION (ICD-786.09) Hx of DVT (ICD-453.40) NEOPLASM, MALIGNANT, LUNG, HX OF (ICD-V10.11)  Review of Systems      See HPI  The patient denies anorexia, fever, weight loss, weight gain, vision loss, decreased hearing, hoarseness, chest pain, syncope, dyspnea on exertion, peripheral edema, prolonged cough, headaches, hemoptysis, abdominal pain, melena, hematochezia, severe indigestion/heartburn, hematuria, incontinence, genital sores, muscle weakness, suspicious skin lesions, transient blindness, difficulty walking, depression, unusual weight change, abnormal bleeding, enlarged lymph nodes, angioedema, breast masses, and testicular masses.    Vital Signs:  Patient profile:   70 year old female Weight:      251 pounds O2 Sat:      92 % on Room air Pulse rate:   72 / minute BP sitting:   130 / 78  (left arm)  Vitals Entered By: Vernie Murders (January 18, 2009 11:13 AM)  O2 Flow:  Room air  Physical Exam  Additional Exam:  General: A/Ox3; pleasant and cooperative, NAD, obese elderly woman, uses rolling walker. Room air sat 92% SKIN: no rash, lesions NODES: no lymphadenopathy HEENT: [place]/AT, EOM- WNL, Conjuctivae- clear, . Dentures, [place] II-III NECK: Supple w/ fair ROM, JVD- none, normal carotid impulses w/o bruits Thyroid-  CHEST: Clear to P&A, diminished, unlabored without accessory use at rest-same again HEART  RR, no m/g/r ABDOMEN:-obese ZOX:WRUE, nl pulses, No cyanosis or clubbing.  Heavy legswith trace edema. NEURO: Grossly intact to observation, has rolling walker      Impression & Recommendations:  Problem # 1:  SLEEP APNEA,  OBSTRUCTIVE (ICD-327.23)  Moderate OSA. We discussed treatment. Weight loss would help. Meanwhile will set home cpap to start with autotratation for pressure check and will bleed in Oxygen. Once she is stable with cpap we will reassess home oximetry.  Problem # 2:  COPD (ICD-496) As a bove.  Medications Added to Medication List This Visit: 1)  Cpap Auto Bleed O2 2l/m [facility]   Other Orders: Est. Patient Level III (16109) DME Referral (DME)  Patient Instructions: 1)  Please schedule a follow-up appointment in 4 months. 2)  We will contact [facility] to start CPAP. Once settled on final pressure, we can reassess need also to use oxygen. Prescriptions: TEMAZEPAM 15 MG CAPS (TEMAZEPAM) 1 at bedtime as needed sleep  #90 x 3   Entered and Authorized by:   Waymon Budge MD   Signed by:   Waymon Budge MD on 01/18/2009   Method used:   Print then Give to Patient   RxID:   [id number]

## 2010-07-23 NOTE — Letter (Signed)
Summary: [facility]  [facility]   Imported By: Lester [place] 10/18/2008 09:01:52  _____________________________________________________________________  External Attachment:    Type:   Image     Comment:   External Document

## 2010-07-23 NOTE — Assessment & Plan Note (Signed)
Summary: 2 months/apc   Copy to:  Dr Jacky Kindle Primary Provider/Referring Provider:  R. Jacky Kindle  CC:  follow up visit-normal SOB .  History of Present Illness: 08/03/08- New pulmonary consult for Dr Jacky Kindle with complaint of dyspnea. 67yoF, cousin of patient here. Says she is more dyspneic than she thinks she should be. Was blamed on her weight, but sees others who look heavier. At time of w/u for lung cancer 2004, she was told at [facility] that she had "emphysema".Treated then with XRT and chemo. Also dx'd then with DVT bilaterally, but never told she had PE. Never anemic. Has felt shortness of breath since then. Occasional wheeze, little cough, no routine phlegm or chest pains. Paces herself to do housework- mainly dyspnea with exertion. Sleeps on 1 pillow, nocturia x 1-2. Comfortable sleeping and at rest. symbicort trial- no effect. Unsure about any hx of pneumonia. Did have pneumovax 2008, both flu vax this year. Quit smoking (40pk/yr) 2004. Denies cardiovascular disease.  09/08/08- Dyspnea, Hx lung Ca No real change- still aware of exertional dyspnea. spiriva helps but is too expensive. May note some wheeze but drenies cough or chest pain. PFT- nonreversible obstruction in small airways with DLCO 50% and normal lung volume. - had to stop twice, unable to sustain walk for 6 minutes straight and desat to 90%. CT chest had shown emphysema, breast densities- she is aware, and esophageal thickening- she is aware.   10/16/08- Dyspnea, Hx lung ca, Hx DVT No better but not changed by pollen. Denies chest pain, palpitation, cough or wheeze, leg pain or tightness. Nocturia x 3 fragments her sleep and she can't easily get back to sleep. Oxygen dries her nose. She hasn't been using the humidifier on her concentrator. Wakes occasionally wheezing. Sleeps sharing bed with a special needs 19yo granddaughter who is not able to report on sleep quality. ONOX doocumented significant desat on room air. [facility] has  now brought O2 for sleep at 2 L/M. maybe she sleeps a little better. ECHO- WNL w/ EF 60%, Right chambers high normal size.     Current Medications (verified): 1)  Levothyroxine Sodium 100 Mcg Tabs (Levothyroxine Sodium) .... Take 1 Tablet By Mouth Once A Day 2)  Simvastatin 20 Mg Tabs (Simvastatin) .... Take One Tablet By Mouth At Bedtime 3)  Fluoxetine Hcl 20 Mg Tabs (Fluoxetine Hcl) .... Take 1 Tablet By Mouth Once A Day 4)  Furosemide 40 Mg Tabs (Furosemide) .... Take 1 Tablet By Mouth Once A Day 5)  Meloxicam 7.5 Mg Tabs (Meloxicam) .... Take 2 Tablets By Mouth Once Daily 6)  Aspirin Adult Low Strength 81 Mg Tbec (Aspirin) .... Take 1 Tablet By Mouth Once A Day 7)  Calcium 600/vitamin D 600-400 Mg-Unit Tabs (Calcium Carbonate-Vitamin D) .... Take 2 Tablets By Mouth Once Daily 8)  Fish Oil 1000 Mg Caps (Omega-3 Fatty Acids) .... Take 1 Tablet By Mouth Once A Day 9)  Darvocet-N 100 100-650 Mg Tabs (Propoxyphene N-Apap) .... As Needed For Back Pain 10)  Spiriva Handihaler 18 Mcg  Caps (Tiotropium Bromide Monohydrate) .... Two Puffs in Handihaler Daily  Allergies (verified): No Known Drug Allergies  Past History:  Family History:    heart disease- mother, brother    cancer- brother    allergies- sister (08/03/2008)  Social History:    Patient states former smoker. 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  Past Medical History:    Reviewed history from 08/03/2008 and no  changes required:        COPD (ICD-496)    DYSPNEA ON EXERTION (ICD-786.09)    Hx of DVT (ICD-453.40)    NEOPLASM, MALIGNANT, LUNG, HX OF (ICD-V10.11)  Past Surgical History:    Reviewed history from 08/03/2008 and no changes required:    thyroidectomy    Aortic aneurysm    Right supraclavicular node biopsy    Left knee replacement  Family History:    Reviewed history from 08/03/2008 and no changes required:       heart disease- mother, brother       cancer- brother       allergies- sister  Social History:    Reviewed history from 08/03/2008 and no changes required:       Patient states former smoker. 1 ppd x 40 yrs, quit 2004       Divorced, children, lives alone       Retired Product/process development scientist  Review of Systems      See HPI  The patient denies fever, weight loss, hoarseness, chest pain, syncope, headaches, abdominal pain, melena, and severe indigestion/heartburn.    Vital Signs:  Patient profile:   70 year old female Height:      62 inches Weight:      254 pounds BMI:     46.63 Pulse rate:   87 / minute BP sitting:   124 / 86  (left arm) Cuff size:   large  Vitals Entered By: Reynaldo Minium CMA (October 16, 2008 3:05 PM)  O2 Sat on room air at rest %:  92 CC: follow up visit-normal SOB  Comments Medications reviewed with patient Reynaldo Minium CMA  October 16, 2008 3:05 PM    Physical Exam  Additional Exam:  General: A/Ox3; pleasant and cooperative, NAD, obese elderly woman, uses rolling walker SKIN: no rash, lesions NODES: no lymphadenopathy HEENT: [place]/AT, EOM- WNL, Conjuctivae- clear, . Dentures NECK: Supple w/ fair ROM, JVD- none, normal carotid impulses w/o bruits Thyroid-  CHEST: Clear to P&A, diminished, unlabored without accessory use at rest HEART  frequent dropped beats, no murmur ABDOMEN: UYQ:IHKV, nl pulses, No cyanosis or clubbing. Heavy legswith trace edema. NEURO: Grossly intact to observation      Impression &  Recommendations:  Problem # 1:  SLEEP APNEA, OBSTRUCTIVE (ICD-327.23)  Strongly suspect sleep apnea and she has suspected it since her sister has complained They are going on  beach trip soon. We will schedule a sleep study, with oxygen. Will give temazepam.  Problem # 2:  DYSPNEA ON EXERTION (ICD-786.09)  Multifactorial dyspnea, aggravated by obesity with hypoventilation. Her updated medication list for this problem includes:    Furosemide 40 Mg Tabs (Furosemide) .Marland Kitchen... Take 1 tablet by mouth once a day    Spiriva Handihaler 18 Mcg Caps (Tiotropium bromide monohydrate) .Marland Kitchen..Marland Kitchen Two puffs in handihaler daily  Medications Added to Medication List This Visit: 1)  Temazepam 15 Mg Caps (Temazepam) .Marland Kitchen.. 1 at bedtime as needed sleep  Other Orders: Est. Patient Level III (09811) Sleep Disorder Referral (Sleep Disorder)  Patient Instructions: 1)  Please schedule a follow-up appointment in 1 month. 2)  see P[facility] to schedue sleep study 3)  Try temazepam for sleep instead of drying  benadryl. Script. 4)  Continue oxygen 5)  Try saline nasal gel otc for dry nose. Prescriptions: TEMAZEPAM 15 MG CAPS (TEMAZEPAM) 1 at bedtime as needed sleep  #30 x 1   Entered and Authorized by:   Waymon Budge MD   Signed by:   Waymon Budge MD on 10/16/2008   Method used:   Print then Give to Patient   RxID:   [id number]

## 2010-07-23 NOTE — Miscellaneous (Signed)
Summary: Care plan/[facility]  Care plan/[facility]   Imported By: Lester [employer] 02/05/2009 08:51:34  _____________________________________________________________________  External Attachment:    Type:   Image     Comment:   External Document

## 2010-07-23 NOTE — Procedures (Signed)
Summary: Oximetry/[facility]  [facility]xim[facility]   Imported By: Lester [place] 09/26/2008 07:42:37  _____________________________________________________________________  External Attachment:    Type:   Image     Comment:   External Document

## 2010-07-23 NOTE — Assessment & Plan Note (Signed)
Summary: 1 MONTH RETURN/[facility]   Copy to:  Dr Jacky Kindle Primary Provider/Referring Provider:  R. Jacky Kindle  CC:  follow up visit-"breathing is semi-good"..  History of Present Illness: Current Problems:  COPD (ICD-496) DYSPNEA ON EXERTION (ICD-786.09) Hx of DVT (ICD-453.40) NEOPLASM, MALIGNANT, LUNG, HX OF (ICD-V10.11)  08/03/08- New pulmonary consult for Dr Jacky Kindle with complaint of dyspnea. 70yoF, cousin of patient here. Says she is more dyspneic than she thinks she should be. Was blamed on her weight, but sees others who look heavier. At time of w/u for lung cancer 2004, she was told at [facility] that she had "emphysema".Treated then with XRT and chemo. Also dx'd then with DVT bilaterally, but never told she had PE. Never anemic. Has felt shortness of breath since then. Occasional wheeze, little cough, no routine phlegm or chest pains. Paces herself to do housework- mainly dyspnea with exertion. Sleeps on 1 pillow, nocturia x 1-2. Comfortable sleeping and at rest. symbicort trial- no effect. Unsure about any hx of pneumonia. Did have pneumovax 2008, both flu vax this year. Quit smoking (40pk/yr) 2004. Denies cardiovascular disease.  09/08/08- Dyspnea, Hx lung Ca No real change- still aware of exertional dyspnea. spiriva helps but is too expensive. May note some wheeze but drenies cough or chest pain. PFT- nonreversible obstruction in small airways with DLCO 50% and normal lung volume. - had to stop twice, unable to sustain walk for 6 minutes straight and desat to 90%. CT chest had shown emphysema, breast densities- she is aware, and esophageal thickening- she is aware.     Current Medications (verified): 1)  Levothyroxine Sodium 100 Mcg Tabs (Levothyroxine Sodium) .... Take 1 Tablet By Mouth Once A Day 2)  Simvastatin 20 Mg Tabs (Simvastatin) .... Take One Tablet By Mouth At Bedtime 3)  Fluoxetine Hcl 20 Mg Tabs (Fluoxetine Hcl) .... Take 1 Tablet By Mouth Once A Day 4)  Furosemide 40 Mg Tabs  (Furosemide) .... Take 1 Tablet By Mouth Once A Day 5)  Meloxicam 7.5 Mg Tabs (Meloxicam) .... Take 2 Tablets By Mouth Once Daily 6)  Aspirin Adult Low Strength 81 Mg Tbec (Aspirin) .... Take 1 Tablet By Mouth Once A Day 7)  Calcium 600/vitamin D 600-400 Mg-Unit Tabs (Calcium Carbonate-Vitamin D) .... Take 2 Tablets By Mouth Once Daily 8)  Fish Oil 1000 Mg Caps (Omega-3 Fatty Acids) .... Take 1 Tablet By Mouth Once A Day 9)  Darvocet-N 100 100-650 Mg Tabs (Propoxyphene N-Apap) .... As Needed For Back Pain 10)  Spiriva Handihaler 18 Mcg  Caps (Tiotropium Bromide Monohydrate) .... Two Puffs in Handihaler Daily  Allergies (verified): No Known Drug Allergies  Past History:  Family History:    heart disease- mother, brother    cancer- brother    allergies- sister (08/03/2008)  Social History:    Patient states former smoker. 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  Past Medical History:    Reviewed history from 08/03/2008 and no changes required:        COPD (ICD-496)    DYSPNEA ON EXERTION (ICD-786.09)    Hx of DVT (ICD-453.40)    NEOPLASM, MALIGNANT, LUNG, HX OF (ICD-V10.11)  Past Surgical History:    Reviewed history from 08/03/2008 and no changes required:    thyroidectomy    Aortic aneurysm    Right supraclavicular node biopsy    Left knee replacement  Family History:    Reviewed history from 08/03/2008 and no changes required:       heart disease- mother, brother        cancer- brother       allergies- sister  Social History:    Reviewed history from 08/03/2008 and no changes required:       Patient states former smoker. 1 ppd x 40 yrs, quit 2004       Divorced, children, lives alone       Retired Product/process development scientist  Review of Systems      See HPI       The patient complains of dyspnea on exertion.  The patient denies fever, weight loss, chest pain, syncope, prolonged cough, hemoptysis, abdominal pain, melena, and severe indigestion/heartburn.         Has been noting occasional palpitation for several nmonths, without chest pain or syncope.  Vital Signs:  Patient profile:   70 year old female Height:      62 inches Weight:      252.38 pounds BMI:     46.33 Pulse rate:   79 / minute BP sitting:   132 / 82  (left arm) Cuff size:   large  Vitals Entered By: Reynaldo Minium CMA (September 08, 2008 10:10 AM)  O2 Sat on room air at rest %:  94 CC: follow up visit-"breathing is semi-good". Comments Medications reviewed with patient Reynaldo Minium CMA  September 08, 2008 10:10 AM    Physical Exam  Additional Exam:  General: A/Ox3; pleasant and cooperative, NAD, obese elderly woman, uses rolling walker SKIN: no rash, lesions NODES: no lymphadenopathy HEENT: [place]/AT, EOM- WNL, Conjuctivae- clear, . Dentures NECK: Supple w/ fair ROM, JVD- none, normal carotid impulses w/o bruits Thyroid- normal to palpation CHEST: Clear to P&A, diminished, unlabored without accessory use at rest HEART  frequent dropped beats, no murmur ABDOMEN: ZOX:WRUE, nl pulses, No cyanosis or clubbing. Heavy legswith trace edema. NEURO: Grossly intact to observation      Pulmonary Function Test Height (in.): 62 Gender: Female  Impression & Recommendations:  Problem # 1:  DYSPNEA ON EXERTION (ICD-786.09) Multiple contributing factors, including obesity with deconditioning, small airway obstructive disease, prior Rx for lung cancer. Of these, with limited potential response to  bronchodilators, the main benefit would come from weight loss which is challenging when exercise capacity is limited.  She is aware of palpitation and I feel dropped beats- need to watch for a cardiac component over time. Will get 2D Echo for Rheart and chamber enlargement. We will check overnight oximetry at home.  Her updated medication list for this problem includes:    Furosemide 40 Mg Tabs (Furosemide) .Marland Kitchen... Take 1 tablet by mouth once a day    Spiriva Handihaler 18 Mcg Caps (Tiotropium bromide monohydrate) .Marland Kitchen..Marland Kitchen Two puffs in handihaler daily  Complete Medication List: 1)  Levothyroxine Sodium 100 Mcg Tabs (Levothyroxine sodium) .... Take 1 tablet by mouth once a day 2)  Simvastatin 20 Mg Tabs (Simvastatin) .... Take one tablet by mouth at bedtime 3)  Fluoxetine Hcl 20 Mg Tabs (Fluoxetine hcl) .... Take 1 tablet by mouth once a day 4)  Furosemide 40 Mg Tabs (Furosemide) .... Take 1 tablet by mouth once a day 5)  Meloxicam 7.5 Mg Tabs (Meloxicam) .... Take 2 tablets by mouth once daily 6)  Aspirin Adult Low Strength 81 Mg Tbec (Aspirin) .... Take 1 tablet by mouth once a day 7)  Calcium 600/vitamin D 600-400 Mg-unit Tabs (Calcium carbonate-vitamin d) .... Take 2 tablets by mouth once daily 8)  Fish Oil 1000 Mg Caps (Omega-3 fatty acids) .... Take 1 tablet by mouth once a day 9)  Darvocet-n 100 100-650 Mg Tabs (Propoxyphene n-apap) .... As needed for back pain 10)  Spiriva Handihaler 18 Mcg Caps (Tiotropium bromide monohydrate) .... Two puffs in handihaler daily  Other Orders: Est. Patient Level III (81191) Echo Referral (Echo) DME Referral (DME)  Patient Instructions: 1)  Please schedule a follow-up appointment in 2 months. 2)  The more you can walk and find ways to lose weigtht, the better you will feel. 3)  See [facility] to schedule echocardiogram and overnight oximetry.

## 2010-07-23 NOTE — Letter (Signed)
Summary: CPAP Supplies/[facility]  CPAP Supplies/[facility]   Imported By: Sherian Rein 02/18/2010 07:39:29  _____________________________________________________________________  External Attachment:    Type:   Image     Comment:   External Document

## 2010-07-23 NOTE — Assessment & Plan Note (Signed)
Summary: f/u after sleep study///kp   Copy to:  Dr Jacky Kindle Primary Provider/Referring Provider:  R. Jacky Kindle  CC:  fu visit after sleep study....sob with exertion.  History of Present Illness: 10/16/08- Dyspnea, Hx lung ca, Hx DVT No better but not changed by pollen. Denies chest pain, palpitation, cough or wheeze, leg pain or tightness. Nocturia x 3 fragments her sleep and she can't easily get back to sleep. Oxygen dries her nose. She hasn't been using the humidifier on her concentrator. Wakes occasionally wheezing. Sleeps sharing bed with a special needs 19yo granddaughter who is not able to report on sleep quality. ONOX doocumented significant desat on room air. [facility] has now brought O2 for sleep at 2 L/M. maybe she sleeps a little better. ECHO- WNL w/ EF 60%, Right chambers high normal size.  11/28/08- Dyspnea, Hx lung Ca, Hx DVT Did not fall asleep for sleep study at all- - attributes to Dr Jacky Kindle having recently changed antidepressant. She had to go to bathroom several times and did not take her temazepam. She uses oxygen at night at 2 L/ She would like to try again. Denies daytime sleepiness. Doesn't notice change attributable to citalopram so far. Using  temazepam most nights. 72 yo mentally challenged granddaughter sleeps in same bed because of seizure hx, but will be away for the summer.    Allergies: No Known Drug Allergies  Past History:  Past Medical History: Last updated: 08/03/2008  COPD (ICD-496) DYSPNEA ON EXERTION (ICD-786.09) Hx of DVT (ICD-453.40) NEOPLASM, MALIGNANT, LUNG, HX OF (ICD-V10.11)  Past Surgical History: Last updated: 08/03/2008 thyroidectomy Aortic aneurysm Right supraclavicular node biopsy Left knee replacement  Family History: Last updated: 08/03/2008 heart disease- mother, brother cancer- brother allergies- sister  Social History: Last updated: 08/03/2008 Patient states former smoker. 1 ppd x 40 yrs, quit 2004 Divorced, children,  lives alone Retired Product/process development scientist  Risk Factors: Smoking Status: quit (08/03/2008)  Review of Systems      See HPI  The patient denies anorexia, fever, weight loss, weight gain, vision loss, decreased hearing, hoarseness, chest pain, syncope, dyspnea on exertion, peripheral edema, prolonged cough, headaches, hemoptysis, abdominal pain, melena, hematochezia, and severe indigestion/heartburn.    Vital Signs:  Patient profile:   70 year old female Weight:      252 pounds BMI:     46.26 O2 Sat:      92 % on Room air Pulse rate:   75 / minute BP sitting:   160 / 80  (left arm) Cuff size:   large  Vitals Entered By: Clarise Cruz Duncan Dull) (November 28, 2008 10:41 AM)  O2 Flow:  Room air  Physical Exam  Additional Exam:  General: A/Ox3; pleasant and cooperative, NAD, obese elderly woman, uses rolling walker. Room air sat 92% SKIN: no rash, lesions NODES: no lymphadenopathy HEENT: [place]/AT, EOM- WNL, Conjuctivae- clear, . Dentures NECK: Supple w/ fair ROM, JVD- none, normal carotid impulses w/o bruits Thyroid-  CHEST: Clear to P&A, diminished, unlabored without accessory use at rest-same HEART  frequent dropped beats, no murmur ABDOMEN: ZOX:WRUE, nl pulses, No cyanosis or clubbing. Heavy legswith trace edema. NEURO: Grossly intact to observation      Impression & Recommendations:  Problem # 1:  SLEEP APNEA, OBSTRUCTIVE (ICD-327.23) Sleep apnea is still likely. We will try to repeat the sleep study, with her wearing oxygen at 2 L/M from start of study, avoiding tea that evening, and taking temazepam.  Medications Added to Medication List This Visit: 1)  Simvastatin  20 Mg Tabs (Simvastatin) .... On hold take one tablet by mouth at bedtime 2)  Fluoxetine Hcl 20 Mg Tabs (Fluoxetine hcl) .... On hold take 1 tablet by mouth once a day 3)  Citalopram Hydrobromide 20 Mg Tabs (Citalopram hydrobromide) .... Once daily 4)  Vitamin D (ergocalciferol) 50000 Unit Caps (Ergocalciferol)  .... 3 days a week  Other Orders: Est. Patient Level II ([address]) Sleep Disorder Referral (Sleep Disorder)  Patient Instructions: 1)  Please schedule a follow-up appointment in 1 month. 2)  See [facility] to schedule repeat sleep study. 3)  Plan on wearing oxygen through the study as you would at home. Please bring and take your sleep medicine Temazepam as you have been doing at home.

## 2010-07-23 NOTE — Assessment & Plan Note (Signed)
Summary: 6 MONTHS/ MBW   Copy to:  Dr Jacky Kindle Primary Provider/Referring Provider:  R. Jacky Kindle  CC:  6 mos followup, sob with exertion, a little cough clear production, and Hypertension Management.  History of Present Illness: April 17, 2009- " I'm doing pretty good". We autotitrated cpap to 14, with oxygen 2L. She doesn't take it with her when she goes to stay with her mother 1 day/ week, but she does have oxygen there. She depends on temazepam for insomnia and she denies excessive sedation or recognized problems. Spiriva is too expensive to use every day.  October 15, 2009- Dyspnea, Hx Lung Cancer, Hx DVT Some watery rhinorhea with pollen.  Dyspnea with exsertion is un changed. Denies wheeze. medicated for tachypalpitation- doesn't know name of med. Hopital for lymphedema in legs in Fall, 2010, needing return for wound vac.Wears elastic ose. Temazepam helps insomnia and asks refill.  Continues CPAP 12 with oxygen 12. She asks about changing to a nasal mask with chinstrap.  April 16, 2010-  Dyspnea, Hx Lung Cancer, Hx DVT, OSA Nurse-CC: 6 mos followup, sob with exertion, a little cough clear production CPAP continues comfortable all night every night at 12. She did change to a nasal mask with chin strap.  She has been dealing with back pain all summer, which has slowed her walk and limited deep breaths. With that, dyspnea hasn't been much of a problem. Denies any obvious worsening. Occasional throat tickle cough treated with cough drops. We discussed Lisinopril as a cause of dry throat cough. It is not a big problem for her. Uses Spiriva most days and keeps Symbicort as a rescue inhaler. She has no SABA. Had flu vax. Had CXR as part of evaluation for back pain.     Hypertension History:      Positive major cardiovascular risk factors include female age 72 years old or older.  Negative major cardiovascular risk factors include non-tobacco-user status.     Preventive  Screening-Counseling & Management  Alcohol-Tobacco     Smoking Status: quit     Packs/Day: 1.5     Year Started: age 37     Year Quit: 2004  Current Medications (verified): 1)  Levothyroxine Sodium 100 Mcg Tabs (Levothyroxine Sodium) .... Take 1 Tablet By Mouth Once A Day 2)  Furosemide 40 Mg Tabs (Furosemide) .... Take 1 Tablet By Mouth Once A Day 3)  Aspirin Adult Low Strength 81 Mg Tbec (Aspirin) .... Take 1 Tablet By Mouth Once A Day 4)  Calcium 600/vitamin D 600-400 Mg-Unit Tabs (Calcium Carbonate-Vitamin D) .... Take 2 Tablets By Mouth Once Daily 5)  Fish Oil 1000 Mg Caps (Omega-3 Fatty Acids) .... Take 1 Tablet By Mouth Once A Day 6)  Vicodin 5-500 Mg Tabs (Hydrocodone-Acetaminophen) .... As Needed For Back Pain 7)  Spiriva Handihaler 18 Mcg  Caps (Tiotropium Bromide Monohydrate) .... Two Puffs in Handihaler Daily 8)  Temazepam 15 Mg Caps (Temazepam) .Marland Kitchen.. 1 At Bedtime As Needed Sleep 9)  Cpap 12 [facility]. Has Had O2 Added 2 L/m 10)  Symbicort 80-4.5 Mcg/act Aero (Budesonide-Formoterol Fumarate) .... 2 Puffs and Rinse, Twice Daily 11)  Lisinopril 20 Mg Tabs (Lisinopril) .... Take 1 By Mouth Once Daily 12)  Replacement Cpap Mask of Choice, Chin Strap 13)  Morphine Sulfate 15 Mg Tabs (Morphine Sulfate) .... As Needed For Pain  Allergies (verified): No Known Drug Allergies  Past History:  Past Medical History: Last updated: 10/15/2009 Obstructive sleep apnea- NPSG 01/06/09- 23.9/hr COPD (ICD-496) DYSPNEA ON  EXERTION (ICD-786.09) Hx of DVT (ICD-453.40) NEOPLASM, MALIGNANT, LUNG, HX OF (ICD-V10.11) Peripheral edema  Past Surgical History: Last updated: 04/17/2009 thyroidectomy Aortic aneurysm stent 2010 Right supraclavicular node biopsy Left knee replacement  Family History: Last updated: 08/03/2008 heart disease- mother, brother cancer- brother allergies- sister  Social History: Last updated: 08/03/2008 Patient states former smoker. 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  The patient denies shortness of breath at rest, productive cough, non-productive cough, coughing up blood, chest pain, irregular heartbeats, acid heartburn, loss of appetite, weight change, abdominal pain, difficulty swallowing, sore throat, tooth/dental problems, headaches, nasal congestion/difficulty breathing through nose, and sneezing.    Vital Signs:  Patient profile:   70 year old female Height:      62 inches Weight:      232.25 pounds BMI:     42.63 O2 Sat:      96 % on Room air Pulse rate:   63 / minute BP sitting:   100 / 60  (right arm) Cuff size:   large  Vitals Entered By: Kandice Hams CMA (April 16, 2010 1:59 PM)  O2 Flow:  Room air CC: 6 mos followup, sob with exertion, a little cough clear production, Hypertension Management Comments pharmacy verfied   Physical Exam  Additional Exam:  General: A/Ox3; pleasant and cooperative, NAD, obese elderly woman, uses rolling walker. Room air sat 96% SKIN: no rash, lesions NODES: no lymphadenopathy HEENT: [place]/AT, EOM- WNL, Conjuctivae- clear, . Dentures, Mallampati II-III NECK: Supple w/ fair ROM, JVD- none, normal carotid impulses w/o bruits Thyroid-  CHEST: Clear to P&A, diminished, unlabored , no rales or cough HEART  RR, no m/g/r ABDOMEN:-obese OVF:IEPP, nl pulses, No cyanosis or clubbing. Heavy legs with trace edema, elastic hose NEURO: Grossly intact to observation, has rolling walker      Impression & Recommendations:  Problem # 1:  SLEEP APNEA, OBSTRUCTIVE (ICD-327.23)  Good compliance and control on CPAP. No changes are appropriate now. Oxygen was added for sleep.   Problem # 2:  COPD (ICD-496) She had been  a heavy smoker. Pulmonary status is not actively demanding her attention. She is limited by her back and not pushing to dyspnea. We discussed her mild dry cough as possibly related to her Lisinopril. It doesn't bother her enough that she wants to risk messing up her BP control, so we won't make a change and will watch the issue.   Problem # 3:  NEOPLASM, MALIGNANT, LUNG, HX OF (ICD-V10.11) No recurrence yet. We hope her luck will hold. Discussed symptoms that might indicate relapse in the chest.   Medications Added to Medication List This Visit: 1)  Oxygen 2 L/m For Sleep  .... [facility] 2)  Proair Hfa 108 (90 Base) Mcg/act Aers (Albuterol sulfate) .... 2 puffs, four times a day as needed rescue inhaler 3)  Morphine Sulfate 15 Mg Tabs (Morphine sulfate) .... As needed for pain  Other Orders: Est. Patient Level IV (29518) Prescription Created Electronically [phone])  Hypertension Assessment/Plan:      The patient's hypertensive risk group is category B: At least one risk factor (excluding diabetes) with no target organ damage.  Today's blood pressure is 100/60.    Patient  Instructions: 1)  Please schedule a follow-up appointment in 1 year. Please call sooner if needed. 2)  Co ntinue CPAP with oxygen 3)  We will get the last CXR report for our file.  Prescriptions: PROAIR HFA 108 (90 BASE) MCG/ACT AERS (ALBUTEROL SULFATE) 2 puffs, four times a day as needed rescue inhaler  #1 x prn   Entered and Authorized by:   Waymon Budge MD   Signed by:   Waymon Budge MD on 04/16/2010   Method used:   Electronically to        [facility]* (retail)       [id number]       [place], [place]  [address]       Ph: [phone]       Fax: [phone]   RxID:   [id number]

## 2010-07-23 NOTE — Miscellaneous (Signed)
Summary: Orders Update  Clinical Lists Changes  Orders: Added new Referral order of DME Referral (DME) - Signed  o2 order with [facility] sent this morning. Rhonda Cobb  September 25, 2008 2:09 PM

## 2010-07-23 NOTE — Miscellaneous (Signed)
Summary: Orders Update pft charges  Clinical Lists Changes  Orders: Added new Service order of Carbon Monoxide diffusing w/capacity (94720) - Signed Added new Service order of Lung Volumes (94240) - Signed Added new Service order of Spirometry (Pre & Post) (94060) - Signed 

## 2010-07-23 NOTE — Assessment & Plan Note (Signed)
Summary: 6 months/ mbw   Copy to:  Dr Jacky Kindle Primary Provider/Referring Provider:  R. Jacky Kindle  CC:  6 month follow up visit.  History of Present Illness:  01/17/09- Dyspnea, Hx Lung cancer, Hx DVT Repeat NPSG went much better with supplemental oxygen plus temazepam 30 mg, which permitted more sustained sleep and  confirmed dx moderate obstructive sleep apnea- AHI 23.9/hr. we again reviewed symptoms and management options.  April 17, 2009- " I'm doing pretty good". We autotitrated cpap to 14, with oxygen 2L. She doesn't take it with her when she goes to stay with her mother 1 day/ week, but she does have oxygen there. She depends on temazepam for insomnia and she denies excessive sedation or recognized problems. Spiriva is too expensive to use every day.  October 15, 2009- Dyspnea, Hx Lung Cancer, Hx DVT Some watery rhinorhea with pollen.  Dyspnea with exsertion is un changed. Denies wheeze. medicated for tachypalpitation- doesn't know name of med. Hopital for lymphedema in legs in Fall, 2010, needing return for wound vac.Wears elastic ose. Temazepam helps insomnia and asks refill.  Continues CPAP 12 with oxygen 12. She asks about changing to a nasal mask with chinstrap.      Current Medications (verified): 1)  Levothyroxine Sodium 100 Mcg Tabs (Levothyroxine Sodium) .... Take 1 Tablet By Mouth Once A Day 2)  Simvastatin 20 Mg Tabs (Simvastatin) .... On Hold Take One Tablet By Mouth At Bedtime 3)  Furosemide 40 Mg Tabs (Furosemide) .... Take 1 Tablet By Mouth Once A Day 4)  Aspirin Adult Low Strength 81 Mg Tbec (Aspirin) .... Take 1 Tablet By Mouth Once A Day 5)  Calcium 600/vitamin D 600-400 Mg-Unit Tabs (Calcium Carbonate-Vitamin D) .... Take 2 Tablets By Mouth Once Daily 6)  Fish Oil 1000 Mg Caps (Omega-3 Fatty Acids) .... Take 1 Tablet By Mouth Once A Day 7)  Vicodin 5-500 Mg Tabs (Hydrocodone-Acetaminophen) .... As Needed For Back Pain 8)  Spiriva Handihaler 18 Mcg  Caps  (Tiotropium Bromide Monohydrate) .... Two Puffs in Handihaler Daily 9)  Temazepam 15 Mg Caps (Temazepam) .Marland Kitchen.. 1 At Bedtime As Needed Sleep 10)  Cpap 12 [facility]. Has Had O2 Added 2 L/m 11)  Symbicort 80-4.5 Mcg/act Aero (Budesonide-Formoterol Fumarate) .... 2 Puffs and Rinse, Twice Daily 12)  Cymbalta 60 Mg Cpep (Duloxetine Hcl) .... Take 1 By Mouth Once Daily 13)  Lisinopril 20 Mg Tabs (Lisinopril) .... Take 1 By Mouth Once Daily  Allergies (verified): No Known Drug Allergies  Past History:  Past Surgical History: Last updated: 04/17/2009 thyroidectomy Aortic aneurysm stent 2010 Right supraclavicular node biopsy Left knee replacement  Family History: Last updated: 08/03/2008 heart disease- mother, brother cancer- brother allergies- sister  Social History: Last updated: 08/03/2008 Patient states former smoker. 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  The patient denies anorexia, fever, weight loss, weight gain, vision loss, decreased hearing, hoarseness, chest pain, syncope, prolonged cough, headaches, hemoptysis, abdominal pain, and severe indigestion/heartburn.    Vital Signs:  Patient profile:   70 year old female Height:      62 inches  Weight:      240.50 pounds BMI:     44.15 O2 Sat:      95 % on Room air Pulse rate:   59 / minute BP sitting:   110 / 78  (left arm) Cuff size:   large  Vitals Entered By: Reynaldo Minium CMA (October 15, 2009 10:43 AM)  O2 Flow:  Room air  Physical Exam  Additional Exam:  General: A/Ox3; pleasant and cooperative, NAD,  obese elderly woman, uses rolling walker. Room air sat 95% SKIN: no rash, lesions NODES: no lymphadenopathy HEENT: [place]/AT, EOM- WNL, Conjuctivae- clear, . Dentures, Mallampati II-III NECK: Supple w/ fair ROM, JVD- none, normal carotid impulses w/o bruits Thyroid-  CHEST: Clear to P&A, diminished, unlabored , no rales or cough HEART  RR, no m/g/r ABDOMEN:-obese KGM:WNUU, nl pulses, No cyanosis or clubbing. Heavy legs with trace edema, elastic hose NEURO: Grossly intact to observation, has rolling walker      Impression & Recommendations:  Problem # 1:  SLEEP APNEA, OBSTRUCTIVE (ICD-327.23)  Good compliance and control using CPAP with supplemental oxygen. We will have her talk with her DME about changing to nasal mask with chin strap.  Problem # 2:  COPD (ICD-496) There may be a corpulmonale component, but her peripheral edema is multifactorial.  Medications Added to Medication List This Visit: 1)  Vicodin 5-500 Mg Tabs (Hydrocodone-acetaminophen) .... As needed for back pain 2)  Cymbalta 60 Mg Cpep (Duloxetine hcl) .... Take 1 by mouth once daily 3)  Lisinopril 20 Mg Tabs (Lisinopril) .... Take 1 by mouth once daily 4)  Replacement Cpap Mask of Choice, Chin Strap   Other Orders: Est. Patient Level III (725[address])  Patient Instructions: 1)  Please schedule a follow-up appointment in 6 months. 2)  Script to show [facility] as you talk with them about possible CPAP mask change. Prescriptions: REPLACEMENT CPAP MASK OF CHOICE, CHIN STRAP   #1 x 0   Entered and Authorized by:   Waymon Budge MD   Signed by:   Waymon Budge MD on 10/15/2009   Method used:   Print then Give to Patient   RxID:   [id number]

## 2010-09-24 LAB — URINALYSIS, ROUTINE W REFLEX MICROSCOPIC
Bilirubin Urine: NEGATIVE
Glucose, UA: NEGATIVE mg/dL
Hgb urine dipstick: NEGATIVE
Ketones, ur: NEGATIVE mg/dL
Nitrite: NEGATIVE
Protein, ur: NEGATIVE mg/dL
Specific Gravity, Urine: 1.015 (ref 1.005–1.030)
Urobilinogen, UA: 1 mg/dL (ref 0.0–1.0)
pH: 5 (ref 5.0–8.0)

## 2010-09-24 LAB — WOUND CULTURE: Gram Stain: NONE SEEN

## 2010-09-24 LAB — PROTIME-INR
INR: 1.06 (ref 0.00–1.49)
Prothrombin Time: 13.7 seconds (ref 11.6–15.2)

## 2010-09-24 LAB — BASIC METABOLIC PANEL
BUN: 21 mg/dL (ref 6–23)
CO2: 28 mEq/L (ref 19–32)
Calcium: 8.6 mg/dL (ref 8.4–10.5)
Chloride: 99 mEq/L (ref 96–112)
Creatinine, Ser: 1.1 mg/dL (ref 0.4–1.2)
GFR calc Af Amer: 59 mL/min — ABNORMAL LOW (ref >60)
GFR calc non Af Amer: 49 mL/min — ABNORMAL LOW (ref >60)
Glucose, Bld: 111 mg/dL — ABNORMAL HIGH (ref 70–99)
Potassium: 4.8 mEq/L (ref 3.5–5.1)
Sodium: 133 mEq/L — ABNORMAL LOW (ref 135–145)

## 2010-09-24 LAB — CBC
HCT: 39.5 % (ref 36.0–46.0)
Hemoglobin: 13.7 g/dL (ref 12.0–15.0)
MCHC: 34.7 g/dL (ref 30.0–36.0)
MCV: 105.8 fL — ABNORMAL HIGH (ref 78.0–100.0)
Platelets: 230 10*3/uL (ref 150–400)
RBC: 3.73 MIL/uL — ABNORMAL LOW (ref 3.87–5.11)
RDW: 13.2 % (ref 11.5–15.5)
WBC: 10.1 10*3/uL (ref 4.0–10.5)

## 2010-09-24 LAB — ANAEROBIC CULTURE: Gram Stain: NONE SEEN

## 2010-09-24 LAB — HEPARIN ANTI-XA: Heparin LMW: <0.1 IU/mL

## 2010-09-24 LAB — APTT: aPTT: 29 seconds (ref 24–37)

## 2010-09-25 LAB — BASIC METABOLIC PANEL
BUN: 12 mg/dL (ref 6–23)
BUN: 9 mg/dL (ref 6–23)
CO2: 24 mEq/L (ref 19–32)
CO2: 28 mEq/L (ref 19–32)
Calcium: 7.9 mg/dL — ABNORMAL LOW (ref 8.4–10.5)
Calcium: 8.1 mg/dL — ABNORMAL LOW (ref 8.4–10.5)
Chloride: 104 mEq/L (ref 96–112)
Chloride: 105 mEq/L (ref 96–112)
Creatinine, Ser: 0.71 mg/dL (ref 0.4–1.2)
Creatinine, Ser: 0.84 mg/dL (ref 0.4–1.2)
GFR calc Af Amer: >60 mL/min (ref >60)
GFR calc Af Amer: >60 mL/min (ref >60)
GFR calc non Af Amer: >60 mL/min (ref >60)
GFR calc non Af Amer: >60 mL/min (ref >60)
Glucose, Bld: 124 mg/dL — ABNORMAL HIGH (ref 70–99)
Glucose, Bld: 136 mg/dL — ABNORMAL HIGH (ref 70–99)
Potassium: 3.7 mEq/L (ref 3.5–5.1)
Potassium: 4 mEq/L (ref 3.5–5.1)
Sodium: 134 mEq/L — ABNORMAL LOW (ref 135–145)
Sodium: 135 mEq/L (ref 135–145)

## 2010-09-25 LAB — DIFFERENTIAL
Basophils Absolute: 0 10*3/uL (ref 0.0–0.1)
Basophils Absolute: 0 10*3/uL (ref 0.0–0.1)
Basophils Relative: 0 % (ref 0–1)
Basophils Relative: 0 % (ref 0–1)
Eosinophils Absolute: 0 10*3/uL (ref 0.0–0.7)
Eosinophils Absolute: 0 10*3/uL (ref 0.0–0.7)
Eosinophils Relative: 0 % (ref 0–5)
Eosinophils Relative: 0 % (ref 0–5)
Lymphocytes Relative: 10 % — ABNORMAL LOW (ref 12–46)
Lymphocytes Relative: 17 % (ref 12–46)
Lymphs Abs: 1 10*3/uL (ref 0.7–4.0)
Lymphs Abs: 1.1 10*3/uL (ref 0.7–4.0)
Monocytes Absolute: 0.4 10*3/uL (ref 0.1–1.0)
Monocytes Absolute: 0.5 10*3/uL (ref 0.1–1.0)
Monocytes Relative: 5 % (ref 3–12)
Monocytes Relative: 6 % (ref 3–12)
Neutro Abs: 4.7 10*3/uL (ref 1.7–7.7)
Neutro Abs: 9.7 10*3/uL — ABNORMAL HIGH (ref 1.7–7.7)
Neutrophils Relative %: 77 % (ref 43–77)
Neutrophils Relative %: 85 % — ABNORMAL HIGH (ref 43–77)

## 2010-09-25 LAB — CBC
HCT: 36 % (ref 36.0–46.0)
HCT: 38 % (ref 36.0–46.0)
HCT: 42.7 % (ref 36.0–46.0)
HCT: 45.3 % (ref 36.0–46.0)
Hemoglobin: 12.4 g/dL (ref 12.0–15.0)
Hemoglobin: 13.2 g/dL (ref 12.0–15.0)
Hemoglobin: 14.7 g/dL (ref 12.0–15.0)
Hemoglobin: 15.4 g/dL — ABNORMAL HIGH (ref 12.0–15.0)
MCHC: 34 g/dL (ref 30.0–36.0)
MCHC: 34.5 g/dL (ref 30.0–36.0)
MCHC: 34.5 g/dL (ref 30.0–36.0)
MCHC: 34.7 g/dL (ref 30.0–36.0)
MCV: 105.5 fL — ABNORMAL HIGH (ref 78.0–100.0)
MCV: 106.1 fL — ABNORMAL HIGH (ref 78.0–100.0)
MCV: 106.2 fL — ABNORMAL HIGH (ref 78.0–100.0)
MCV: 95.3 fL (ref 78.0–100.0)
Platelets: 120 10*3/uL — ABNORMAL LOW (ref 150–400)
Platelets: 122 10*3/uL — ABNORMAL LOW (ref 150–400)
Platelets: 139 10*3/uL — ABNORMAL LOW (ref 150–400)
Platelets: 214 10*3/uL (ref 150–400)
RBC: 3.39 MIL/uL — ABNORMAL LOW (ref 3.87–5.11)
RBC: 3.59 MIL/uL — ABNORMAL LOW (ref 3.87–5.11)
RBC: 4.05 MIL/uL (ref 3.87–5.11)
RBC: 4.76 MIL/uL (ref 3.87–5.11)
RDW: 13.4 % (ref 11.5–15.5)
RDW: 13.4 % (ref 11.5–15.5)
RDW: 13.7 % (ref 11.5–15.5)
RDW: 14.5 % (ref 11.5–15.5)
WBC: 11.4 10*3/uL — ABNORMAL HIGH (ref 4.0–10.5)
WBC: 11.4 10*3/uL — ABNORMAL HIGH (ref 4.0–10.5)
WBC: 6.2 10*3/uL (ref 4.0–10.5)
WBC: 6.3 10*3/uL (ref 4.0–10.5)

## 2010-09-25 LAB — COMPREHENSIVE METABOLIC PANEL
ALT: 19 U/L (ref 0–35)
AST: 22 U/L (ref 0–37)
Albumin: 3.2 g/dL — ABNORMAL LOW (ref 3.5–5.2)
Alkaline Phosphatase: 72 U/L (ref 39–117)
BUN: 14 mg/dL (ref 6–23)
CO2: 24 mEq/L (ref 19–32)
Calcium: 8.1 mg/dL — ABNORMAL LOW (ref 8.4–10.5)
Chloride: 100 mEq/L (ref 96–112)
Creatinine, Ser: 1.01 mg/dL (ref 0.4–1.2)
GFR calc Af Amer: >60 mL/min (ref >60)
GFR calc non Af Amer: 54 mL/min — ABNORMAL LOW (ref >60)
Glucose, Bld: 117 mg/dL — ABNORMAL HIGH (ref 70–99)
Potassium: 4.1 mEq/L (ref 3.5–5.1)
Sodium: 133 mEq/L — ABNORMAL LOW (ref 135–145)
Total Bilirubin: 1.2 mg/dL (ref 0.3–1.2)
Total Protein: 6.4 g/dL (ref 6.0–8.3)

## 2010-09-25 LAB — POCT I-STAT, CHEM 8
BUN: 14 mg/dL (ref 6–23)
Calcium, Ion: 1.04 mmol/L — ABNORMAL LOW (ref 1.12–1.32)
Chloride: 103 mEq/L (ref 96–112)
Creatinine, Ser: 1 mg/dL (ref 0.4–1.2)
Glucose, Bld: 118 mg/dL — ABNORMAL HIGH (ref 70–99)
HCT: 44 % (ref 36.0–46.0)
Hemoglobin: 15 g/dL (ref 12.0–15.0)
Potassium: 4.1 mEq/L (ref 3.5–5.1)
Sodium: 136 mEq/L (ref 135–145)
TCO2: 24 mmol/L (ref 0–100)

## 2010-09-25 LAB — WOUND CULTURE
Culture: NO GROWTH
Culture: NO GROWTH
Gram Stain: NONE SEEN
Gram Stain: NONE SEEN

## 2010-09-25 LAB — URINALYSIS, ROUTINE W REFLEX MICROSCOPIC
Bilirubin Urine: NEGATIVE
Glucose, UA: NEGATIVE mg/dL
Ketones, ur: 15 mg/dL — AB
Nitrite: NEGATIVE
Protein, ur: NEGATIVE mg/dL
Specific Gravity, Urine: 1.02 (ref 1.005–1.030)
Urobilinogen, UA: 0.2 mg/dL (ref 0.0–1.0)
pH: 5.5 (ref 5.0–8.0)

## 2010-09-25 LAB — CULTURE, BLOOD (ROUTINE X 2)
Culture: NO GROWTH
Culture: NO GROWTH
Culture: NO GROWTH
Culture: NO GROWTH

## 2010-09-25 LAB — PROTIME-INR
INR: 1.35 (ref 0.00–1.49)
Prothrombin Time: 16.6 seconds — ABNORMAL HIGH (ref 11.6–15.2)

## 2010-09-25 LAB — LACTIC ACID, PLASMA: Lactic Acid, Venous: 1 mmol/L (ref 0.5–2.2)

## 2010-09-25 LAB — URINE MICROSCOPIC-ADD ON

## 2010-09-26 LAB — COMPREHENSIVE METABOLIC PANEL
ALT: 29 U/L (ref 0–35)
AST: 27 U/L (ref 0–37)
Albumin: 3.6 g/dL (ref 3.5–5.2)
Alkaline Phosphatase: 93 U/L (ref 39–117)
BUN: 13 mg/dL (ref 6–23)
CO2: 25 mEq/L (ref 19–32)
Calcium: 9.7 mg/dL (ref 8.4–10.5)
Chloride: 105 mEq/L (ref 96–112)
Creatinine, Ser: 0.66 mg/dL (ref 0.4–1.2)
GFR calc Af Amer: >60 mL/min (ref >60)
GFR calc non Af Amer: >60 mL/min (ref >60)
Glucose, Bld: 73 mg/dL (ref 70–99)
Potassium: 4.5 mEq/L (ref 3.5–5.1)
Sodium: 136 mEq/L (ref 135–145)
Total Bilirubin: 0.7 mg/dL (ref 0.3–1.2)
Total Protein: 6.6 g/dL (ref 6.0–8.3)

## 2010-09-26 LAB — CBC
HCT: 38 % (ref 36.0–46.0)
HCT: 43.8 % (ref 36.0–46.0)
Hemoglobin: 13.2 g/dL (ref 12.0–15.0)
Hemoglobin: 15.4 g/dL — ABNORMAL HIGH (ref 12.0–15.0)
MCHC: 34.7 g/dL (ref 30.0–36.0)
MCHC: 35.1 g/dL (ref 30.0–36.0)
MCV: 104 fL — ABNORMAL HIGH (ref 78.0–100.0)
MCV: 104 fL — ABNORMAL HIGH (ref 78.0–100.0)
Platelets: 109 10*3/uL — ABNORMAL LOW (ref 150–400)
Platelets: 130 10*3/uL — ABNORMAL LOW (ref 150–400)
RBC: 3.66 MIL/uL — ABNORMAL LOW (ref 3.87–5.11)
RBC: 4.21 MIL/uL (ref 3.87–5.11)
RDW: 13.1 % (ref 11.5–15.5)
RDW: 13.3 % (ref 11.5–15.5)
WBC: 6 10*3/uL (ref 4.0–10.5)
WBC: 9.2 10*3/uL (ref 4.0–10.5)

## 2010-09-26 LAB — BLOOD GAS, ARTERIAL
Acid-Base Excess: 1.6 mmol/L (ref 0.0–2.0)
Bicarbonate: 25.8 mEq/L — ABNORMAL HIGH (ref 20.0–24.0)
Drawn by: 206361
FIO2: 0.21 %
O2 Saturation: 92.1 %
Patient temperature: 98.6
TCO2: 27 mmol/L (ref 0–100)
pCO2 arterial: 41.5 mmHg (ref 35.0–45.0)
pH, Arterial: 7.41 — ABNORMAL HIGH (ref 7.350–7.400)
pO2, Arterial: 62.6 mmHg — ABNORMAL LOW (ref 80.0–100.0)

## 2010-09-26 LAB — PROTIME-INR
INR: 1 (ref 0.00–1.49)
Prothrombin Time: 12.8 seconds (ref 11.6–15.2)

## 2010-09-26 LAB — TYPE AND SCREEN
ABO/RH(D): O POS
Antibody Screen: NEGATIVE

## 2010-09-26 LAB — APTT: aPTT: 31 seconds (ref 24–37)

## 2010-09-26 LAB — ABO/RH: ABO/RH(D): O POS

## 2010-09-26 LAB — BASIC METABOLIC PANEL
BUN: 15 mg/dL (ref 6–23)
CO2: 25 mEq/L (ref 19–32)
Calcium: 8.2 mg/dL — ABNORMAL LOW (ref 8.4–10.5)
Chloride: 102 mEq/L (ref 96–112)
Creatinine, Ser: 0.81 mg/dL (ref 0.4–1.2)
GFR calc Af Amer: >60 mL/min (ref >60)
GFR calc non Af Amer: >60 mL/min (ref >60)
Glucose, Bld: 170 mg/dL — ABNORMAL HIGH (ref 70–99)
Potassium: 3.5 mEq/L (ref 3.5–5.1)
Sodium: 134 mEq/L — ABNORMAL LOW (ref 135–145)

## 2010-09-26 LAB — URINALYSIS, ROUTINE W REFLEX MICROSCOPIC
Bilirubin Urine: NEGATIVE
Glucose, UA: NEGATIVE mg/dL
Hgb urine dipstick: NEGATIVE
Ketones, ur: NEGATIVE mg/dL
Nitrite: NEGATIVE
Protein, ur: NEGATIVE mg/dL
Specific Gravity, Urine: 1.005 (ref 1.005–1.030)
Urobilinogen, UA: 0.2 mg/dL (ref 0.0–1.0)
pH: 7 (ref 5.0–8.0)

## 2010-10-17 ENCOUNTER — Encounter: Payer: Self-pay | Admitting: Internal Medicine

## 2010-10-30 ENCOUNTER — Encounter: Payer: Self-pay | Admitting: Internal Medicine

## 2010-10-30 ENCOUNTER — Telehealth: Payer: Self-pay | Admitting: Cardiology

## 2010-10-30 NOTE — Telephone Encounter (Signed)
I have made pt an appointment with [place] for 11/20/10 for consult per Dr.Aronson.  We saw patient in hospital only. She is currently experiencing SOB and Dr. Jacky Kindle wants to make sure that it is not heart related.  She has known COPD. The RN wanted to know if waiting until the 30th is ok or if she should be seen sooner.

## 2010-10-31 ENCOUNTER — Other Ambulatory Visit: Payer: Self-pay | Admitting: *Deleted

## 2010-10-31 MED ORDER — TIOTROPIUM BROMIDE MONOHYDRATE 18 MCG IN CAPS: 18.0000 ug | ORAL_CAPSULE | Freq: Every day | RESPIRATORY_TRACT | Status: DC

## 2010-11-05 NOTE — Assessment & Plan Note (Signed)
OFFICE VISIT   STEEDMAN, Joeline A  DOB:  11/07/1940                                       11/06/2009  CHART#:[id number]   DATE OF SURGERY:  Endovascular repair of abdominal aortic aneurysm in  October 2010.   Patient was seen a week ago for drainage from her left groin wound  following endovascular repair of an abdominal aortic aneurysm and  incision and drainage of bilateral groins.   HISTORY OF PRESENT ILLNESS:  As previously stated, she was evaluated in  the office last week after her left groin had developed a blister over  the wound and opened.  She has treated this with antibiotics of Cipro  500 mg p.o. b.i.d. and dressing changes.  Her wound was cultured.  Final  report showed no aerobic organisms were isolated.  The second culture  demonstrated Staphylococcus coag negative.  A few were seen.  No  organisms were seen on the Gram's smear.   Physical findings revealed a very pleasant woman in no apparent  distress.  She was able to walk from the waiting room to the exam room  without distress.  Heart rate was 67.  Blood pressure was 126/86.  O2  saturation was 92% with a temperature of 98.6.  HEENT:  PERRLA, EOMI  with normal conjunctiva.  Cardiac exam revealed a regular rate and  rhythm.  The abdomen was soft, nontender.  Musculoskeletal examination  demonstrated no major deformities.  Attention was turned to her left  groin.  The wound was well-healed.  I was unable to express any drainage  from the wound.  The wound appeared to be well-healed.  There was still  a very small area of firmness within the wound; however, this appeared  to be unchanged.  I did not probe the wound, as the incision was healed.   ASSESSMENT:  Her groin wound has now healed over.  There was no  drainage from the wound.   PLAN:  She will continue to take her antibiotics for the full 10-day  course.  She is to follow-up with Dr. Arbie Cookey in 2 weeks for evaluation of  her stent  graft, at which time she will undergo CTA with stent graft  protocol.   Wilmon Arms, PA   Quita Skye Hart [place], M.D.  Electronically Signed   KEL/MEDQ  D:  11/06/2009  T:  11/06/2009  Job:  [id number]

## 2010-11-05 NOTE — Assessment & Plan Note (Signed)
OFFICE VISIT   [name], [name] A  DOB:  [birth year]                                       02/23/2009  VOZDG#:[id number]   The patient presents for continued followup of her abdominal aortic  aneurysm.  She had been seen on 08/27 at which time ultrasound showed a  significant increase in size of her aneurysm.  She underwent CT scan  which confirmed enlarged infrarenal abdominal aortic aneurysm.  She does  have a perforator anatomy for stent grafting.  I had a long discussion  with the patient explaining the treatment and expected recovery from  stent graft repair of her abdominal aortic aneurysm.  This included the  potential for inability to treat this and potential use of open repair.  She understands and wishes to proceed with this once we can schedule  her.   Larina Earthly, M.D.  Electronically Signed   TFE/MEDQ  D:  03/23/2009  T:  03/24/2009  Job:  [id number]

## 2010-11-05 NOTE — Assessment & Plan Note (Signed)
OFFICE VISIT   KERISSA, COIA A  DOB:  [birth year]                                       05/21/2010  EAVWU#:[id number]   Patient presents today for continued follow-up of her stent graft repair  of infrarenal abdominal aortic aneurysm and also a follow-up of her  known descending thoracic aneurysm.  She reports no new medical  difficulties since our last visit with her.  No other change in her  health status.  She is able to walk without difficulty and has no  limitations.   PHYSICAL EXAMINATION:  A well-developed and well-nourished white female,  moderately obese, in no acute stress.  Blood pressure is 119/74, pulse  64, respirations 24.  HEENT:  Normal.  Chest:  Clear bilaterally.  Heart:  Regular rate rhythm.  Her radial and femoral pulses are 2+.  Abdominal exam reveals obesity.  No masses noted.  Musculoskeletal shows  no major deformities or cyanosis.  Neurologic:  No focal weakness or  paresthesias.  Skin without ulcers or rashes.  She does have well-healed  groin incisions bilaterally with no evidence of erythema and no evidence  of fluctuans.   She underwent a CT of her chest and abdomen, which I have reviewed and  compared to her prior studies.  This shows excellent positioning of her  stent graft with continued shrinking of her native saccular aneurysm.  Regarding her descending thoracic aorta, she does have an irregular  aneurysm in the mid descending thoracic, and this is no change from her  CT of her chest 1 year ago.   I discussed this at length with patient.  I have recommended that we see  her again in 1 year with repeat CT scan for follow-up.     Larina Earthly, M.D.  Electronically Signed   TFE/MEDQ  D:  05/21/2010  T:  05/22/2010  Job:  [id number]   cc:   Geoffry Paradise, M.D.

## 2010-11-05 NOTE — Assessment & Plan Note (Signed)
OFFICE VISIT   ONETTA, SPAINHOWER A  DOB:  Mar 17, 1941                                       09/17/2007  ZOXWR#:[id number]   [name] presents today for continued followup of her abdominal aortic  aneurysm.  She was last seen 1 year ago with ultrasound at that time.  She has no symptoms referable to her aneurysm.  Her medical issues are  unchanged.  She did have a diagnosis of lung cancer and apparently will  have discontinuation of CT followup after one additional year.  She did  have a recent CT of her chest which I have reviewed.  She does have some  arthritic difficulties in her back for which she is receiving  chiropractic manipulation.   PHYSICAL EXAM:  Her blood pressure is 177/103, pulse 75, respirations  20.  Abdominal Exam:  Reveals moderate obesity, she does not have any  tenderness, she has palpable femoral pulses.   I reviewed her ultrasound, from today in our office, and this reveals no  significant change, maximal diameter today is 4.4 vs. 4.3 cm one year  ago.  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  We will continue to follow her in  Vascular Lab, I would not recommend yearly CT scan for her thoracic  ectasia, this could be repeated every 3 years for ruling out any  progression of this thoracic ectasia.  We will follow her on a yearly  basis regarding her abdominal aortic aneurysm.  She knows to present to  the emergency room should she have any symptoms referable to this.   Larina Earthly, M.D.  Electronically Signed   TFE/MEDQ  D:  09/17/2007  T:  09/17/2007  Job:  [id number]   cc:   Geoffry Paradise, M.D.  Valentino Hue. Magrinat, M.D.

## 2010-11-05 NOTE — Assessment & Plan Note (Signed)
OFFICE VISIT   CAIT, LOCUST A  DOB:  09-18-1940                                       02/16/2009  ZOXWR#:[id number]   The patient presents today for continued follow-up of her abdominal  aortic aneurysm.  This has been followed for a number of years and I had  not seen her in 18 months.  She had a recent ultrasound in our office  and this showed significant enlargement since the last imaging study.  She is here for further discussion of this.  She does have a history of  lung cancer and is in remission.  She does have a history of ectasia of  her thoracic aorta as well.  Her complaints otherwise are of increased  swelling in both legs.  She did have a history of bilateral lower  extremity DVT.  She reports some increased difficulty with chronic  breathing difficulties as well.   PAST HISTORY:  Is significant for emphysema.  She does have elevated  cholesterol.   SOCIAL HISTORY:  She is single with 2 children.  She does not smoke,  having quit in 2004.  Does not drink alcohol on a regular basis.  Her  weight is reported at 250 pounds.  She has extensive pulmonary issues.  She does have a history of hiatal hernia, urinary frequency and  arthritic joint pain.   MEDICATION LIST:  Is attached.   PHYSICAL EXAMINATION:  An obese white female in no acute distress.  Her  blood pressure is 132/78, pulse 76, respirations 18.  Her radial pulses  are 2+.  She does have 2+ femoral pulses and 2+ dorsalis pedis pulses.  She does have marked pitting edema bilaterally from her knees distally.  Abdominal exam:  Is obese with no palpable aneurysm.   She underwent ultrasound in our office last week and this showed a  marked increase in size of her aneurysm up to 5.2 cm from 4.4 cm in  March 2009.  I have recommended CT scan for further evaluation of this.  We will see her in the office for further discussion following this.   Larina Earthly, M.D.  Electronically  Signed   TFE/MEDQ  D:  02/16/2009  T:  02/19/2009  Job:  [id number]   cc:   Geoffry Paradise, M.D.  Valentino Hue. Magrinat, M.D.

## 2010-11-05 NOTE — Procedures (Signed)
DUPLEX ULTRASOUND OF ABDOMINAL AORTA   INDICATION:  Followup of known AAA.   HISTORY:  Diabetes:  No.  Cardiac:  No.  Hypertension:  No.  Smoking:  No.  Connective Tissue Disorder:  Family History:  Grandfather and aunt had aneurysms.  Patient thinks  they may have been cerebral.  Previous Surgery:   DUPLEX EXAM:         AP (cm)                   TRANSVERSE (cm)  Proximal             2.34 cm                   2.41 cm  Mid                  4.44 cm                   4.43 cm  Distal               3.30 cm                   3.11 cm  Right Iliac          1.35 cm                   1.31 cm  Left Iliac           1.08 cm                   1.25 cm   PREVIOUS:  Date: 08/24/06  AP:  4.32  TRANSVERSE:  4.18   IMPRESSION:  1. Essentially stable abdominal aortic aneurysm with maximum diameter      measuring 4.4 cm (anteroposterior) X 4.43 cm at the mid distal      aorta.  2. Moderate mural thrombus noted in abdominal aortic aneurysm.  3. Ectatic common iliac arteries, right > left.   ___________________________________________  Larina Earthly, M.D.   PB/MEDQ  D:  09/17/2007  T:  09/17/2007  Job:  [id number]

## 2010-11-05 NOTE — Assessment & Plan Note (Signed)
OFFICE VISIT   [name], [name]  DOB:  September 08, 1940                                       05/11/2009  CHART#:[id number]   The patient is here today for followup of her Gore excluder stent graft.  She did have significant lymphoceles in both groins.  These are still  present but are resolving.  She does not have any fever or any increased  soreness in these.   On physical examination she has mild erythema in her pannus.  She does  have Ball larger lymphocele on the right than on the left.   She will continue her usual activities.  We will see her again in 6  weeks.  She knows to notify us if she develops any increased soreness,  swelling or fevers.   Larina Earthly, M.D.  Electronically Signed   TFE/MEDQ  D:  05/11/2009  T:  05/11/2009  Job:  [id number]

## 2010-11-05 NOTE — Assessment & Plan Note (Signed)
OFFICE VISIT   [name], LEOPARD A  DOB:  Nov 16, 1940                                       04/11/2009  ZOXWR#:[id number]   The patient presents today for concern regarding her groin incisions  following stent graft repair of her abdominal aortic aneurysm on  03/26/2009.  On physical exam, her incisions themselves are healed quite  nicely.  She does have mild erythema and a moderate amount of swelling  in her panniculus.  She does have edema and a peau d'orange appearance  over this area.  There is very mild tenderness and no fluctuance..  She  reports that she has not been having fevers at home.  She does appear to  have lymphatic congestion and I suspect probably has lymphoceles as I  discussed with her is very common after groin incision exposure.  She  was given a prescription for Keflex 500 mg t.i.d. for 10 days, and we  plan to see her again as planned on 04/20/2009 with a CT scan followup.   Larina Earthly, M.D.  Electronically Signed   TFE/MEDQ  D:  04/11/2009  T:  04/12/2009  Job:  [id number]

## 2010-11-05 NOTE — Assessment & Plan Note (Signed)
OFFICE VISIT   [name], [name] A  DOB:  07/21/1940                                       08/17/2009  WUJWJ#:[id number]   Patient presents for followup of her stent graft repair of abdominal  aortic aneurysm in October, 2010.  She had complication with chronic  lymphoceles bilaterally, which required eventual drainage.  She had a  VAC placed in her right groin that has now completely healed her right  groin.  She does report some lower extremity swelling, and this does  appear to be both venous and certainly it could be lymphatic as well.  She is wearing her 20/30 compression.  This is helping to some degree,  and she is also using diuretic treatment.   She does have 2+ dorsalis pedis pulses bilaterally.   We plan to see her again in May for her next CT scan followup of her  stent graft repair.  She also has a known 4.5 cm thoracic aneurysm, and  we will plan CT of her chest in May, 2012 for further followup of this.  She is having chronic back pain and is seeing [facility] with  potential for stimulator.  I do not see any contraindication to  attempted stimulator implant if clinically indicated.     Larina Earthly, M.D.  Electronically Signed   TFE/MEDQ  D:  08/17/2009  T:  08/17/2009  Job:  [id number]   cc:   Geoffry Paradise, M.D.  Richard D. Ethelene Hal, M.D.

## 2010-11-05 NOTE — Procedures (Signed)
DUPLEX DEEP VENOUS EXAM - LOWER EXTREMITY   INDICATION:  Edema and red streaks.   HISTORY:  Edema:  Bilateral  Trauma/Surgery:  No  Pain:  Tenderness on the red skin of the bilateral ankles.  PE:  No  Previous DVT:  History of bilateral lower extremity DVT in 2003.  Anticoagulants:  yes   DUPLEX EXAM:                CFV   SFV   PopV  PTV    GSV                R  L  R  L  R  L  R   L  R  L  Thrombosis    o  o  o  o  o  o  o   o  o  o  Spontaneous   +  +  +  +  +  +  +   +  +  +  Phasic        +  +  +  +  +  +  +   +  +  +  Augmentation  +  +  +  +  +  +  +   +  +  +  Compressible  +  +  +  +  +  +  +   +  +  +  Competent     o  o  +  +  +  o  +   +  o  o   Legend:  + - yes  o - no  p - partial  D - decreased   IMPRESSION:  1. No evidence of acute deep vein thrombosis noted in the bilateral      lower extremities.  2. Mild to moderate reflux noted in the bilateral common femoral,      greater saphenous and left popliteal veins.  3. Limited visualization of the bilateral calf veins due to edema and      the patient's body habitus.    _____________________________  Larina Earthly, M.D.   CH/MEDQ  D:  02/07/2009  T:  02/08/2009  Job:  [id number]

## 2010-11-05 NOTE — Procedures (Signed)
DUPLEX ULTRASOUND OF ABDOMINAL AORTA   INDICATION:  Abdominal aortic aneurysm.   HISTORY:  Diabetes:  no  Cardiac:  no  Hypertension:  no  Smoking:  no  Connective Tissue Disorder:  Family History:  Grandfather and aunt had aneurysm  Previous Surgery:  no   DUPLEX EXAM:         AP (cm)                   TRANSVERSE (cm)  Proximal             2.4 cm                    2.7 cm  Mid                  2.9 cm                    3.2 cm  Distal               5.0 cm                    5.2 cm  Right Iliac          Not visualized            Not visualized  Left Iliac           Not visualized            Not visualized   PREVIOUS:  Date: 09/17/2007  AP:  4.44  TRANSVERSE:  4.43   IMPRESSION:  1. Aneurysm of the mid to distal abdominal aorta with significant      increase in the maximum diameter when compared to the previous      exam.  2. Unable to adequately visualize the bilateral common iliac arteries      due to overlying bowel gas pattern.   ___________________________________________  Larina Earthly, M.D.   CH/MEDQ  D:  02/07/2009  T:  02/08/2009  Job:  [id number]   cc:   Geoffry Paradise, M.D.

## 2010-11-05 NOTE — Assessment & Plan Note (Signed)
OFFICE VISIT   [name], HUKILL A  DOB:  1940-09-13                                       11/16/2009  ZHYQM#:[id number]   Patient presents today for continued followup of her stent graft repair  of abdominal aortic aneurysm on 03/26/2009.  She had postoperative  complication of infected lymphoceles and underwent an open debridement  and drainage bilaterally.  She had ongoing healing and then over the  last several weeks had presented with some opening of her left groin.  This cultured methicillin-resistant staphylococcus aureus and was  treated appropriately with Bactrim.  This has improved.  She has no  erythema at these areas today and no open wound.   She is here today with a CT scan done today for follow-up of her stent  graft.   Her past history has otherwise unchanged.   PHYSICAL EXAMINATION:  Blood pressure 138/62.  Heart rate is 70.  Room-  air saturations are 96%.  Temperature is 97.7.  She is in no acute  distress.  HEENT is normal.  Lungs are clear bilaterally.  Abdomen is  obese.  No tenderness and no masses and no pulsatile aneurysm felt.  Musculoskeletal shows no major deformities or cyanosis.  Neurologic:  No  focal weakness or paresthesias.  Skin without ulcers or rashes.  Her  groin incisions are healed bilaterally.  There is no evidence of fluid  collection.   She underwent CT of her abdomen and pelvis.  This shows excellent  positioning of her stent graft with no evidence of endoleak and no  evidence of fluid collection in her groins bilaterally.  There is some  scarring in her groins bilaterally.   I reassured patient with this discussion.  We will plan to see her again  in 6 months with CT scan of her abdomen to follow up on her stent graft  and also a CT scan of her chest to follow up her known 4.5 cm descending  thoracic aneurysm.     Larina Earthly, M.D.  Electronically Signed   TFE/MEDQ  D:  11/16/2009  T:  11/16/2009  Job:   [id number]

## 2010-11-05 NOTE — Procedures (Signed)
NAME:  [name], [name]                  ACCOUNT NO.:  [id number]   MEDICAL RECORD NO.:  [id number]          PATIENT TYPE:  OUT   LOCATION:  SLEEP CENTER                 FACILITY[facility]  [facility]   PHYSICIAN:  Clinton D. Maple Hudson, MD, FCCP, FACPDATE OF BIRTH:  03[birth year]18-1941   DATE OF STUDY:  01/06/2009                            NOCTURNAL POLYSOMNOGRAM   REFERRING PHYSICIAN:  Clinton D. Young, MD, FCCP, FACP   INDICATION FOR STUDY:  Hypersomnia with sleep apnea.  Epworth sleepiness  score 3/24, BMI 46.1, weight 252 pounds, height 62 inches, neck 16.5  inches.   HOME MEDICATIONS:  Charted and reviewed.   On a previous study, she was unable to sleep.  She is returning now to  be studied wearing supplemental oxygen at 2 L.   SLEEP ARCHITECTURE:  Total sleep time 254 minutes with sleep efficiency  67%.  Stage I was 8.5%, stage II 77.6%, stage III 12.4%, REM 1.6% of  total sleep time.  Sleep latency 25 minutes, REM latency 264 minutes,  awake after sleep onset 95.5 minutes, arousal index 19.4.  Temazepam 15  mg was taken at 9:30 p.m. and repeated at 11:00 p.m..   RESPIRATORY DATA:  Apnea/hypopnea index (AHI) 23.9 per hour.  A total of  101 events was scored including 95 obstructive apneas and 6 hypopneas.  Events were seen in all sleep positions.  REM AHI 60 per hour.  This is  a diagnostic NPSG study.   OXYGEN DATA:  Moderately loud snoring with oxygen desaturation to a  nadir of 91%.  Mean oxygen saturation through the study was 96.7% while  wearing supplemental oxygen at 2 L per minute.   CARDIAC DATA:  Sinus rhythm with occasional PAC and PVC.   MOVEMENT/PARASOMNIA:  Occasional limb jerks with a total of 12 counted,  of which 2 were associated with arousal or awakening for periodic limb  movement with arousal index of 0.5 per hour.  Bathroom x2.   IMPRESSION/RECOMMENDATIONS:  1. Wearing supplemental oxygen at 2 L per minute and after taking a      total of 30 mg of temazepam, she slept a total  of 254 minutes.  She      awoke spontaneously at 4:00 a.m., used the bathroom and stated that      she felt she would sleep no more and recording was stopped.  2. Moderate obstructive sleep apnea/hypopnea syndrome, AHI 23.9 per      hour with nonpositional events, moderately loud snoring and oxygen      desaturation to a nadir of 91% on supplemental oxygen.  3. She wore supplemental oxygen at 2 L per minute as directed.  This      did seem to contribute to improve sleep quality with less waking      and fragmentation, then identified on previous studies.      Clinton D. Maple Hudson, MD, [facility], FACP  Diplomate, Biomedical engineer of Sleep Medicine  Electronically Signed     CDY/MEDQ  D:  01/13/2009 12:58:15  T:  01/13/2009 21:12:43  Job:  [id number]

## 2010-11-05 NOTE — Assessment & Plan Note (Signed)
OFFICE VISIT   EKAM, BONEBRAKE A  DOB:  May 11, 1941                                       07/20/2009  ZOXWR#:[id number]   The patient presents today for continued follow-up of bilateral groin  lymphoceles.  She continues to use a vac on the right, her left groin is  completely healed.  On the right she has a very small open area under  her pannus.  There is no evidence of infection or surrounding erythema.  She will discontinue the vac and continue with normal saline wet to dry  dressing on a daily basis.  We will see her again in 1 month for  continued follow-up.  She will have a CT scan in May for a 12-month  follow-up of her stent graft.     Larina Earthly, M.D.  Electronically Signed   TFE/MEDQ  D:  07/20/2009  T:  07/23/2009  Job:  [id number]

## 2010-11-05 NOTE — Procedures (Signed)
NAME:  [name], [name]                  ACCOUNT NO.:  [id number]   MEDICAL RECORD NO.:  [id number]          PATIENT TYPE:  OUT   LOCATION:  SLEEP CENTER                 FACILITY:  [facility]   PHYSICIAN:  Clinton D. Maple Hudson, MD, FCCP, FACPDATE OF BIRTH:  [birth year]/1939   DATE OF STUDY:  11/11/2008                            NOCTURNAL POLYSOMNOGRAM   REFERRING PHYSICIAN:   INDICATION FOR STUDY:  Insomnia with sleep apnea.   EPWORTH SLEEPINESS SCORE:  3/24.  BMI 46.1.  Weight 252 pounds.  Height  62 inches.  Neck 16.5 inches.   MEDICATIONS:  Home medications charted and reviewed including oxygen 2  liters for sleep.   SLEEP ARCHITECTURE:  Total sleep time 0.  This patient did not sleep  during the 394 minutes of recording.  She indicated sometimes this  happens.  Home medication temazepam had been put on hold reportedly by  her primary physician and no bedtime medications were taken.   Per protocol, the study was begun on room air with technician noting  temperature saturations as low as 88%.  Rhythm at that time was sinus.  Heart rhythm subsequently converted to atrial fibrillation at a heart  rate of approximately 90 per minute.  There were frequent aberrant beats  or PVCs.  Nasal oxygen was placed at 2 liters per minute providing  sustained oxygen saturation 92-94% on room air with no immediate change.  Eventually, rhythm converted back to regular sinus rhythm at 75 beats  per minute still with occasional PVC.  The patient described no  discomfort during this period.  She demonstrated no tendency towards  sleep either on room air or wearing oxygen.  After a total recorded time  of 394 minutes without sleep she was discharged home.  During her  recording time at the laboratory she had bathroom x6.   RESPIRATORY DATA:   OXYGEN DATA:   CARDIAC DATA:   MOVEMENT-PARASOMNIA:   IMPRESSIONS-RECOMMENDATIONS:  No sleep for evaluation.  Note hypoxia on  room air.  Technician attempted to follow  laboratory protocol.  It is  not clear whether her lapse into atrial fibrillation reflected  oxygenation, because it persisted despite normal oxygen saturation  through much of the study before converting back to sinus rhythm with  PVCs.  Clinical assessment advised.      Clinton D. Maple Hudson, MD, [facility], FACP  Diplomate, Biomedical engineer of Sleep Medicine  Electronically Signed     CDY/MEDQ  D:  11/17/2008 20:56:58  T:  11/18/2008 07:18:26  Job:  [id number]

## 2010-11-05 NOTE — Assessment & Plan Note (Signed)
OFFICE VISIT   [name], [name] A  DOB:  06/08/1941                                       06/29/2009  CHART#:[id number]   She presents today for continued follow-up of lymphoceles after her  stent graft repair.  Most recently she had undergone incision and  drainage of her right groin lymphocele and placement of a VAC.  This was  on 05/31/2009.  Her left groin looks excellent today with no drainage  and no evidence of recurrent lymphocele.  On the right side,  the space  is markedly diminished with the VAC dressing.  She will continue with  this and I will see her again in 3 weeks for continued follow-up     Larina Earthly, M.D.  Electronically Signed   TFE/MEDQ  D:  06/29/2009  T:  07/02/2009  Job:  [id number]

## 2010-11-05 NOTE — Assessment & Plan Note (Signed)
OFFICE VISIT   BELLAMIA, FERCH A  DOB:  02/08/1941                                       11/02/2009  CHART#:[id number]   The patient had been seen in our office for concern regarding open  blistering area on her left groin on 10/30/2009.  Culture results came  back today with methicillin-resistant staph.  She had been placed on  Cipro which this is not sensitive to.  I discussed this with the  infectious disease doctor at [facility] for recommendations.  He offered consultation but I really was just asking for recommendations  for antibiotic therapy.  She has no history of allergy to Bactrim.  His  suggestion was that since she does not have any evidence of invasive  infection of her prosthetic graft in this area, to place her on Bactrim  DS 2 pills due to her large size twice a day for 10 days.  I will see  her again in 1 week for continued followup.     Larina Earthly, M.D.  Electronically Signed   TFE/MEDQ  D:  11/02/2009  T:  11/05/2009  Job:  [id number]

## 2010-11-05 NOTE — Assessment & Plan Note (Signed)
OFFICE VISIT   [name], [name]  DOB:  15-Mar-1941                                       04/20/2009  PIRJJ#:[id number]   The patient presents today for continued follow-up of her stent graft  repair of abdominal aortic aneurysm on 03/26/2009.  I had seen her last  week with some erythema over her pannus.  She is been on Keflex since  10/20.  She does have some resolution of the erythema.  Her incisions  themselves look quite good.  Her CT scan today for follow-up of her  stent graft reveals excellent placement of her stent graft with no  evidence of endoleak.  She does have, as anticipated, serous lymphoceles  in her groins bilaterally.  I discussed this with the patient and plan  to see her again in 3 weeks for wound check.  She will notify us should  she develop any fevers or drainage from the incisions.   Larina Earthly, M.D.  Electronically Signed   TFE/MEDQ  D:  04/20/2009  T:  04/24/2009  Job:  [id number]   cc:   Geoffry Paradise, M.D.  Valentino Hue. Magrinat, M.D.

## 2010-11-05 NOTE — H&P (Signed)
HISTORY AND PHYSICAL EXAMINATION   May 29, 2009   Re:  [name], WALSWORTH                  DOB:  10/18/1940   CARDIOLOGIST:  Peter M. Swaziland, M.D.   PULMONOLOGIST:  Rennis Chris. Maple Hudson, MD, FCCP, FACP.   CHIEF COMPLAINT:  Swelling in right groin x1 day.   HISTORY OF PRESENT ILLNESS:  [name] is a 70 year old Caucasian  female who is status post abdominal aortic aneurysm stent graft repair  by Dr. Gretta Ball on March 26, 2009.  She was readmitted to [facility] on May 17, 2009 for bilateral lymphoceles with low grade  fevers.  She was taken to the operating room that the same day, and  incision and drainage was performed by Dr. Venida Jarvis.  The  lymphocele sacs were resected bilaterally.  She had blood and wound  cultures that were both negative.  She was, however, discharged home on  a 14-day course of Keflex.  She was also discharged home with bilateral  groin Jackson-Pratt drains, as she was still having significant serous  drainage from bilateral groins.  Today, May 29, 2009, she was seen  at [facility] office because the right Jackson-Pratt  drain inadvertently came out.  Since then, she had had increased  swelling in the right groin.  It was also more tender.  She denied fever  but has had slight chills.  She herself could not visualize her groins  but said that her home health nurse did not notice any redness in her  groins as of yesterday.   PAST MEDICAL HISTORY:  1. Abdominal aortic aneurysm, status post a stent graft repair March 26, 2009 by Dr. Gretta Ball.  2. COPD.  3. Obstructive sleep apnea.  4. Hypothyroidism.  5. Hypercholesterolemia.  6. Uterine fibroids.  7. Fibrocystic breast disease.  8. Hiatal hernia.  9. Osteoarthritis.  10.Remote history of DVT.  11.History of non-small cell carcinoma of the lung, metastatic to the      supraclavicular and mediastinal lymph nodes, treated with       chemotherapy and radiation therapy.  12.A history of left total knee replacement.  13.History of stress urinary incontinence with multiple procedures.  14.Obesity.   FAMILY HISTORY:  Positive for heart disease in her father.  There is  also a history of heart disease in her father in which he died of a  heart attack at age 8.   SOCIAL HISTORY:  She is single, 2 children.  She is retired.  She does  not smoke.  She did smoke but quit in 2003.  She does not drink alcohol  on a regular basis.   ALLERGIES:  She has no known drug allergies.   MEDICATIONS:  1. Keflex 500 mg p.o. t.i.d.  2. Metoprolol 25 mg p.o. b.i.d.  3. Percocet 5/325 mg 1-2 tablets p.o. q.6h. p.r.n. for pain.  4. Ipratropium bromide 18 mcg inhaled 2 puffs daily.  5. Aspirin 81 mg p.o. daily.  6. Celexa 20 mg p.o. daily.  7. Fish oil capsule 1 gram p.o. daily.  8. Furosemide 40 mg p.o. daily.  9. Levothroid 100 mcg p.o. daily.  10.Lisinopril 20 mg daily.  11.Meloxicam 7.5 mg b.i.d.  12.Robaxin 500 mg p.o. q.4h. p.r.n. for spasm.  13.Restoril 15 mg 1 tablet p.o. q.h.s. p.r.n. for sleep.   REVIEW OF SYSTEMS:  She reports no changes  from the [facility] office review of  12-point review of systems form from last month.  This was positive for  weight gain, shortness of breath with exertion, with home oxygen use.  She also has to urinate frequently.  She reports arthritic pains and  anxiety.  Today she also denies dysuria and chest pain.  She also  reports numbness.  She also denies vomiting but does occasionally get  slightly nauseated when her groins feel full.   PHYSICAL EXAMINATION:  Vitals:  Heart rate 63, blood pressure 106/64.  Oxygen saturation 95% on room air.  Temperature 96.9.  General: This is  a pleasant Caucasian female who appears her stated age.  She is obese.  She appears in no acute distress.  HEENT: Head is normocephalic,  atraumatic.  Sclera nonicteric.  Neck:  No goiter was appreciated.  Lungs:  Lung  sounds were clear throughout.  There was mild increased  work of breathing noted with ambulation.  Cardiovascular:  Her heart had  a regular rate and rhythm.  She had dorsalis pedis Doppler signals  bilaterally.  Bilateral lower extremities were both large with 1+ lower  extremity edema.  Abdomen is soft, nontender, nondistended.  She has  decreased bowel sounds.  No masses were appreciated.  Musculoskeletal:  No major deformities or cyanosis noted.  Neurologic exam is nonfocal.  She is alert and oriented.  No extremity weakness appreciated.  Skin  shows no pedal ulcerations.  She does have a left groin Jackson-Pratt  drain which has serous drainage.  The incision has no erythema.  There  is a slight drainage noted on the dressing, which has a bright greenish-  yellow appearance.  There was no significant odor.  The right groin  shows fullness with moderate-to-large size bulge, consistent with  lymphocele.  There is surrounding erythema.  There is a scant amount of  serous drainage.  The old gauze dressing also has a bright greenish-  yellow drainage.   ASSESSMENT AND PLAN:  1. Bilateral groin lymphoceles with reaccumulation on the right      following inadvertent removal of the right Jackson-Pratt drain.      There is also right groin cellulitis.  Since the lymphocele appears      to be reaccumulating, it is felt she will most likely need surgical      incision and drainage.  In regards to the cellulitis, the drainage      appears consistent with Pseudomonas.  Subsequently, the Keflex will      be discontinued, and she will be started on Cipro IV.  She also      appears to have some mild erythema along her pannus with yeast-type      appearance.  We will begin nystatin powder b.i.d.  She will be      admitted to [facility] telemetry unit 2000.  Her groins      were evaluated by Dr. Hart Rochester today at the office.  She will be      reevaluated by Dr. Myra Gianotti by December 8, and a  final decision at      that time would be made regarding possible need for a surgical      intervention.  2. Abdominal aortic aneurysm, status post stent graft repair.  3. Chronic obstructive pulmonary disease with occasional home oxygen      use.  Her patient feels breathing is at her baseline.  4. Hypothyroidism, treated.   Jerold Coombe, P.A.  Quita Skye Hart Rochester, M.D.  Electronically Signed   AWZ/MEDQ  D:  05/29/2009  T:  05/30/2009  Job:  [id number]

## 2010-11-05 NOTE — Assessment & Plan Note (Signed)
OFFICE VISIT   Ball, Bonnie A  DOB:  07-29-1940                                       10/30/2009  CHART#:[id number]   CHIEF COMPLAINT:  Drainage from left groin wound status post  endovascular repair of abdominal aortic aneurysm and incision and  drainage of bilateral lung fields.   HISTORY OF PRESENT ILLNESS:  The patient is a 70 year old woman who had  endovascular repair of abdominal aortic aneurysm in October 2010.  She  developed large lymphoceles postoperatively and had bilateral I and D of  the lymphoceles in November of 2010 and then she an incision and  drainage of the right lymphocele on May 31, 2009 with a wound VAC.  She had wound cultures done in December which grew out strep coagulase  negative at that time.  At this point approximately 2 weeks ago the  patient states that her nephew climbed on her hip and hit the left groin  area.  She developed a blister like dimple over the wound and had pain  and this past Sunday felt something pop in that area and she had some  old bloody drainage and has had some serous yellowish drainage since  that time. She states she has had low grade fever of 99 at home but no  chills.  The patient denies any pain in the area.  She states the wound  felt better after it started to drain.   PHYSICAL EXAMINATION:  This is an overweight female in no acute  distress.  Her heart rate was 82.  Her saturations were 98%  Respiratory  rate was 12.  The right groin wound was well-healed and flat.  The left  groin wound:  She still had a small area of firmness in the wound.  There was a pinpoint area which was draining serous and old bloody  drainage.  A Q-tip was placed in the wound had opened a little farther  and old blood and minimal purulence was elicited from the wound.  The  wound was approximately 0.5 cm wide and 0.5 cm deep.  There was no major  erythema or pockets other than this.  Otherwise it was  well-healed.   ASSESSMENT:  1. Small superficial abscess in left groin wound 5 months post      incision and drainage of a lymphocele.  2. Incision and drainage of this wound.   PLAN:  1. The patient will be placed on Cipro 500 mg p.o. b.i.d. for 10 days.      She will also take Culturelle twice a day to prevent diarrhea with      the antibiotics.  2. She will follow up in 1 week in the clinic to assess the wound.  3. She will do twice daily dressing changes with moist saline packed      into the wound wet to dry dressings.   Della Goo, PA-C   Bonnie Skye. Hart Ball, M.D.  Electronically Signed   RR/MEDQ  D:  10/30/2009  T:  10/30/2009  Job:  [id number]

## 2010-11-06 ENCOUNTER — Telehealth: Payer: Self-pay | Admitting: Internal Medicine

## 2010-11-06 NOTE — Telephone Encounter (Signed)
Left message on pts cell phone number that CY has to approve/deny the medication requested; he is out of the office this afternoon but I will have him address this message first thing in the morning. I asked that the patient call me back and let me know that she did get this message.

## 2010-11-06 NOTE — Telephone Encounter (Signed)
Pharmacy aware that CY is out of the office this afternoon and he has to approve the refill.

## 2010-11-06 NOTE — Telephone Encounter (Signed)
Patient calling back to let Florentina Addison know she got her message.

## 2010-11-07 MED ORDER — TEMAZEPAM 15 MG PO CAPS: 15.0000 mg | ORAL_CAPSULE | Freq: Every evening | ORAL | Status: DC | PRN

## 2010-11-07 NOTE — Telephone Encounter (Signed)
Called Rx to pharmacy and left message on pts number that this has been taken care of. [name]

## 2010-11-08 NOTE — H&P (Signed)
NAME:  [name], [name]                  ACCOUNT NO.:  [id number]   MEDICAL RECORD NO.:  [id number]          PATIENT TYPE:  OUT   LOCATION:  XRAY                         FACILITY:  [facility]   PHYSICIAN:  Madlyn Frankel. Charlann Boxer, M.D.  DATE OF BIRTH:  1941/02/04   DATE OF ADMISSION:  07/20/2006  DATE OF DISCHARGE:                              HISTORY & PHYSICAL   PROCEDURE:  A left total knee arthroplasty.   CHIEF COMPLAINT:  Left knee pain.   HISTORY OF PRESENT ILLNESS:  This is a 70 year old female and who is  approximately 5 months out from a left knee arthroscopic.  She had a  persistent progressive knee pain.  Radiographic evaluation had shown  degenerative changes consistent with findings of a spontaneous  osteonecrosis in prior visits.  It was refractory to all conservative  measurements.  Pain and quality of life was severe and affected quality  of life.  As a result she has gone ahead and been given a left total  knee replacement as definitive treatment.   PAST MEDICAL HISTORY INCLUDES:  1. COPD.  2. Lung cancer.  3. DVT.  4. Hypercholesteremia.  5. Hypothyroidism.  6. Hiatal hernia.  7. Uterine fibroids.  8. Fibrocystic breast disease.   PAST SURGICAL HISTORY INCLUDES:  1. Thyroidectomy.  2. Breast biopsy in 1964 and 1965.  3. Bladder sling 2003.  4. Lymph node biopsy 2003.  5. Left knee arthroscopy 2007.   FAMILY HISTORY INCLUDES:  1. Heart disease.  2. Hypertension.  3. Diabetes.   DRUG ALLERGIES:  No known drug allergies.   MEDICATIONS INCLUDE:  1. Levothroid 0.1 mg.  2. Prozac 20 mg one p.o. daily.  3. Meloxicam 7.5 mg.  4. Furosemide.  5. Aspirin 81 mg.  6. __________ Robaxin.  7. Vicodin p.r.n.   REVIEW OF SYSTEMS:  In the past 2 weeks no new signs or symptoms of any  cardiovascular, respiratory, genitourinary, gastrointestinal,  neurological, or musculoskeletal system complaints.   PHYSICAL EXAM:  VITALS:  Temperature 97.8, pulse 84, respirations 18,  blood pressure 122/82.  GENERAL:  She is awake, alert and oriented, well-developed, well-  nourished in mild distress.  Uses a rolling walker.  NECK:  Supple, no carotid bruits.  CHEST:  Lung sounds diminished but clear to auscultation bilaterally.  BREASTS:  Deferred.  HEART: Regular rate and rhythm without gallops, clicks, rubs, or  murmurs.  ABDOMEN:  Obese, nontender, nondistended.  GENITOURINARY:  Deferred.  EXTREMITIES:  Painful to any movement or range of motion testing.  SKIN:  Pitting edema 3+.  Skin pink, warm.  No signs of cellulitis.  Nonpalpable dorsalis pedis pulse.  NEUROLOGIC:  Intact distal sensibilities.   LABS:  Pending [name] February 1 appointment.   X-RAYS:  Reviewed.   IMPRESSION:  Shows left knee degenerative changes with osteonecrosis.   PLAN OF ACTION:  Left total knee arthroplasty at [facility] on July 28, 2006 by surgeon Dr. Durene Romans.  Risks and complications were  discussed.  Questions were encouraged answered and reviewed.     ______________________________  Yetta Glassman Bonnie Ave,  PA      Madlyn Frankel. Charlann Boxer, M.D.  Electronically Signed    BLM/MEDQ  D:  07/20/2006  T:  07/20/2006  Job:  [id number]

## 2010-11-08 NOTE — Consult Note (Signed)
NAME:  [name], [name]                  ACCOUNT NO.:  [id number]   MEDICAL RECORD NO.:  [id number]          PATIENT TYPE:  OUT   LOCATION:  GYN                          FACILITY:  [facility]   PHYSICIAN:  De Blanch, M.D.DATE OF BIRTH:  August 11, 1940   DATE OF CONSULTATION:  01/28/2005  DATE OF DISCHARGE:                                   CONSULTATION   REFERRING PHYSICIAN:  Conley Simmonds, M.D.   CHIEF COMPLAINT:  Right ovarian mass.   HISTORY OF PRESENT ILLNESS:  A 70 year old white female seen in consultation  at the request of Dr. Conley Simmonds regarding management of a right adnexal  mass.  The patient has recently had an ultrasound (December 09, 2004) which  shows a right solid homogeneously hypoechoic mass measuring 3.9 x 2.9 x 3.7  cm arising in the right adnexa.  There is no evidence of significant  intralesional flow and there has been no overall change in size in  comparison to prior CT scans dating back to 2004.   Patient denies any pelvic symptoms.  She has no GI or GU symptoms.  Has no  pelvic pain, pressure, vaginal bleeding, or discharge.   PAST MEDICAL HISTORY:   MEDICAL ILLNESSES:  1.  Nonsmall cell carcinoma of the lung metastatic to supraclavicular and      mediastinal lymph nodes.  This was treated with chemotherapy and      radiation therapy and the patient is in remission.  2.  Multinodular goiter on thyroid replacement therapy.  3.  Hyperlipidemia.  4.  Bilateral deep venous thrombosis 2004.  This was treated with one year      of Lovenox.  The patient is currently not on any anticoagulant therapy.  5.  Plantar fasciitis for approximately six months.  6.  Obesity.   PAST SURGICAL HISTORY:  1.  Supraclavicular lymph node biopsy.  2.  Partial thyroidectomy.  3.  Breast biopsy.  4.  Bladder sling.   CURRENT MEDICATIONS:  Baby aspirin.   FAMILY HISTORY:  The patient has a cousin with breast cancer.  There is no  other ovarian, gynecologic, or colon cancers in  the family.   SOCIAL HISTORY:  The patient is divorced.  She is retired.   OBSTETRICAL HISTORY:  Gravida 2.   ALLERGIES:  None.   CURRENT MEDICATIONS:  Levothroid, Fluoxetine, Zocor, furosemide, potassium  supplement, calcium with vitamin D, fish oil, vitamin E, baby aspirin, and  Celebrex.   PHYSICAL EXAMINATION:  VITAL SIGNS:  Height 5 feet 3 inches, weight 218  pounds, blood pressure 148/88, pulse 76.  GENERAL:  The patient is a healthy, obese white female in no acute distress.  HEENT:  Negative.  NECK:  Supple without thyromegaly.  LYMPH:  There is no supraclavicular or inguinal adenopathy.  ABDOMEN:  Soft, nontender.  No mass, organomegaly, ascites, or hernias  noted.  PELVIC:  EGBUS, vagina, bladder, urethra are normal.  Cervix is normal.  Uterus is anterior, normal shape, size, consistency.  Adnexa do not reveal  any palpable masses.  Rectovaginal examination confirms.  EXTREMITY:  Lower extremities  without edema, varicosities.   IMPRESSION:  Solid right adnexal mass which has been stable for  approximately two years.   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  It is asymptomatic.  Management options would include either observation or  surgical removal.  After discussing the pros and cons of each the patient  would prefer to have this observed and therefore I would suggest she have  repeat ultrasound in six months.  She will return to the care of Dr. Conley Simmonds to have serial ultrasounds obtained.  As long as it remains stable,  would not plan any further surgical resection.      De Blanch, M.D.  Electronically Signed     DC/MEDQ  D:  01/28/2005  T:  01/28/2005  Job:  [id number]   cc:   Telford Nab, R.N.  [address]  [place], [place] [address]   Randye Lobo, M.D.  Fax: [phone]

## 2010-11-08 NOTE — Consult Note (Signed)
NAME:  [name], [name]                            ACCOUNT NO.:  [id number]   MEDICAL RECORD NO.:  [id number]                   PATIENT TYPE:  INP   LOCATION:  3731                                 FACILITY:  [facility]   PHYSICIAN:  Valentino Hue. Magrinat, M.D.            DATE OF BIRTH:  1940/11/23   DATE OF CONSULTATION:  05/02/2003  DATE OF DISCHARGE:                                   CONSULTATION   REFERRING MEDICAL DOCTOR:  Dr. Geoffry Paradise.   REASON FOR CONSULT:  Deep venous thrombosis (DVT), possible lymphoma.   HISTORY OF PRESENT ILLNESS:  [name] is a delightful 70 year old [place] woman, asked to see in consultation for bilateral DVT -- possible  lymphoma.  The patient was admitted electively after she developed lower  extremity DVT after being on anticoagulation.  In review, the patient went  on a lengthy tour bus from February 28, 2003 through March 06, 2003.  About a week after returning, she developed left lower extremity swelling,  with increasing pain which, per venous Doppler studies on April 03, 2003,  DVT and superficial venous thrombosis were diagnosed in that leg.  She then  was placed on Coumadin and Lovenox without adequate results.  As she  returned on April 18, 2003 for a new Doppler evaluation, it was noticed  that she developed DVT in the right leg as well.  She continued on  anticoagulation.  In the meantime, a CT of the chest on April 21, 2003 was  negative for PE.  Since anticoagulation failed to resolve the DVTs, the  patient was instructed to be admitted for heparinization, and possible  filter placement.   In addition, the same CT of the chest also showed extensive mediastinal,  hilar and supraclavicular adenopathy suggestive of lymphoma.  No suspicious  lung masses were seen, only two tiny pulmonary nodules (no description of  the size or the shape of these nodules is described in the report).  A CT of  the abdomen and pelvis was negative  for adenopathy.  An increased right  ovary size and a 3-cm abdominal aorta were the only remarkable findings.  Dr. Jacky Kindle contacted [facility] for possible lymph node biopsy in order to obtain  a diagnosis, and further plans to follow.  We are consulted for further  evaluation of her.   PAST MEDICAL HISTORY:  1. Possible lymphoma, as above.  2. Bilateral lower extremity DVT.  3. Depression.  4. Multinodular goiter, hypothyroidism.  5. Hyperlipidemia.  6. History of stress incontinence requiring multiple procedures.   SURGERY:  1. Status post bladder sling in 2003.  2. Status post bladder suspension, Dr. Jamison Neighbor, 2000.  3. Status post cystoscopy and TV to the vaginal sling on October 11, 2002, Dr.     Logan Bores.  4. Status post breast biopsy in the 1960s.  5. Status post partial thyroidectomy in the 1960s.  ALLERGIES:  No known drug allergies.   CURRENT MEDICATIONS:  1. Ancef -- Keflex 1 g IV q.8 h.  2. Heparin per pharmacy.  3. Synthroid 100 mcg daily.  4. Zocor 20 mg nightly.  5. Ativan 1 mg q.6 h. p.r.n.  6. Demerol 50 mg IV q.4 h. p.r.n.  7. Morphine 1 or 2 mg q.3 h. p.r.n.  8. Percocet one or two p.o. q.4 h. p.r.n.  9. MiraLax and Tylenol p.r.n.   REVIEW OF SYSTEMS:  See HPI for significant positives.  She denies any fever  or chills.  She does have daily night sweats since discontinuing her  hormonal replacement after being diagnosed with DVT.  She does notice  decreased energy as well.  No headaches or blurred vision.  No shortness of  breath or dyspnea on exertion.  No productive or unproductive cough.  No  hematemesis.  No chest pain or palpitations.  No abdominal pain, nausea,  vomiting or diarrhea.  No constipation.  No hematochezia.  No dysuria or  gross hematuria.  As mentioned above, she does have stress incontinence.  She describes her calf tenderness as a tight feeling due to the swelling.  No diastasis.  She does have peripheral edema, consistent with her  DVT.   FAMILY HISTORY:  Mother alive and well at 66.  Father died of MI at 23.  One  sister alive with history of asthma.   GYNECOLOGICAL HISTORY:  Her last menstrual period was in her 73s.  She does  have her uterus and ovaries.  She took hormone replacement for 12 years and  stopped three weeks ago.   SOCIAL HISTORY:  The patient is divorced.  She has two children, [name], 46,  with a history of thyroid cancer, and [name], 72 years old, as long as she  knows, in good health.  Her level of education went through the first year  of college, and until recently, she was an Environmental health practitioner.  She  denies any alcohol intake.  She continues to smoke 1 pack a day for the last  40 years, although obviously, during her hospitalization, she has not  smoked.  She lives alone, in a townhouse, and is very independent.  She does  not have a specific caretaker, although she has good family and friend  support.  She does not have a living will.   HEALTH MAINTENANCE:  Her last cholesterol check was at the hospital.  Her  last mammogram and Pap exam were performed in May 2004.  The reports of the  mammogram are available at the [facility].  She never has had a  colonoscopy.  She denies having any transfusions.  Her last flu shot was in  October 2004 and her Pneumovax on March 26, 2003.  Her last bone density  scan in 2003.   PHYSICAL EXAMINATION:  GENERAL:  On physical exam, this is a 70 year old  white female in no acute distress, alert and oriented x3.  VITAL SIGNS:  Blood pressure 110/60, pulse 70, respirations 20, temperature  97.3.  Pulse oximetry 93% on room air.  HEENT:  Normocephalic, atraumatic.  PERRLA.  Oral mucosa intact.  NECK:  Neck supple.  No JVD.  No cervical masses.  There is a 1-cm left  supraclavicular area mass, consistent with the lymph node described in the  films. CHEST:  Lungs with expiratory rhonchi bilaterally, cleared with cough.  She  does have wheezing.   No rales.  Doubt any discrete masses.  BREASTS:  No axillary masses. Cannot feel the cysts described by the  patient, or any discrete masses.  There is a well-healed right lumpectomy  site.  CARDIOVASCULAR:  Regular rate and rhythm with no murmurs, rubs, or gallops.  ABDOMEN:  Abdomen soft, nontender.  Bowel sounds x4.  No palpable spleen or  liver.  GU AND RECTAL:  Deferred.  EXTREMITIES:  No clubbing or cyanosis.  Her skin is warm, and her lower  extremities demonstrate bilateral tense edema, with a right circumference of  40 cm below the knee, and the left 41 cm.  The right toes demonstrate some  area of erythema in the right foot, which continues to be observed in order  to rule out cellulitis.  The patient is currently on antibiotics.  NEUROLOGIC:  Nonfocal.   LABORATORIES:  Hemoglobin 12.8, hematocrit 37.1, WBC 10.4, platelets  220,000.  Her PT on admission was 21 and her INR was 2.4.  In today's labs,  her PT is 19.2 and her INR is 2.4.  Her TSH is 2.496, T4 10.4.  Her BMET  shows sodium of 127, potassium 3.8, BUN 7, creatinine 0.6, glucose 128.  As  of May 01, 2003, her total bilirubin was 0.3, alkaline phosphatase 62,  AST 25, ALT 22, total protein 6.6, albumin 2.8, calcium 8.4, magnesium 2.1,  phosphorus 2.8.   ASSESSMENT AND PLAN:  35. A 70 year old [facility] woman with a recent history of bilateral deep     venous thromboses, not resolved with coumadinization, admitted for     heparin treatment, asked to see for recommendations for possible filter     placement.  Will check lupus anticoagulant panel, antiphospholipid     antibody panels, beta-2 glycoprotein antibody panel test, and factor II     levels.  Based on these results, further recommendations by Dr. Darnelle Catalan     will follow.  2. Rule out lymphoma:  Dr. Ines Bloomer will perform a bronchoscopy and     mediastinoscopy on May 03, 2003, and upon these results, therapy     will proceed accordingly.    Thank you very much for the opportunity to participate in the care of Ms.  [name].     Marlowe Kays, P.A.                        Valentino Hue. Magrinat, M.D.    SW/MEDQ  D:  05/02/2003  T:  05/02/2003  Job:  [id number]   cc:   Geoffry Paradise, M.D.  [address]  [place]  [place] [address]  Fax: [phone]

## 2010-11-08 NOTE — H&P (Signed)
NAME:  [name], [name] NO.:  [id number]   MEDICAL RECORD NO.:  [id number]                   PATIENT TYPE:  INP   LOCATION:  3731                                 FACILITY:  [facility]   PHYSICIAN:  Geoffry Paradise, M.D.               DATE OF BIRTH:  06/14/1941   DATE OF ADMISSION:  05/01/2003  DATE OF DISCHARGE:                                HISTORY & PHYSICAL   CHIEF COMPLAINT:  Bilateral deep vein thromboses.   HISTORY OF PRESENT ILLNESS:  [name] is a pleasant 70 year old white  female with prior history including multinodular goiter and hypothyroidism,  hyperlipidemia, depression presenting at this time with bilateral lower  extremity DVTs.  She has been reasonably healthy at her baseline health when  she presented on April 03, 2003 with 5 days of left lower extremity  swelling, tightness, and hardness.  She had recently completed a lengthy bus  tour in September and this was less than 2-3 weeks after that.  She denied  any chest pain or shortness of breath at that time.  She was on hormonal  replacement therapy.  Venous Dopplers confirmed a left lower extremity DVT  and she was placed on Lovenox as an outpatient and subsequently bridged to  Coumadin for full anticoagulation.  This was deemed secondary to a prolonged  bus tour and hormone replacement therapy thus Coumadin was felt to be safe  with no evidence of pulmonary emboli.  Her last INR was 3 and she was well  maintained but subsequent to which developed onset of right lower extremity  swelling and pain despite therapeutic anticoagulation.  Subsequent Doppler  study reconfirmed now a second DVT on anticoagulation.  Given these findings  search for underlying malignancy was undertaken as based upon her history  and physical nothing was obvious.  She did have CT abdomen, pelvis and chest  revealing extensive mediastinal and hilar adenopathy along with  supraclavicular adenopathy.  These were  suggestive of lymphoma with no  underlying lung mass found but two tiny pulmonary nodules.  Bilateral breast  masses were felt likely to cysts and CT of the abdomen and pelvis revealed  no abdominal adenopathy, no abdominal organ pathology, a small 3 cm aneurysm  and a questionable enlarged right ovary.  Anticoagulation was further  monitored and further workup was due to be undertaken with the assistance of  Dr. Edwyna Shell of [facility] through mediastinoscopy and/or lymph node biopsy but in  the ensuing period of time she developed recalcitrant pain with bilateral  lower extremities, increasing swelling despite anticoagulation.  She is  admitted at this time for IV anticoagulation given her failure of outpatient  modalities; additionally, she will likely need a possible filter to bridge  the gap while diagnostic study and/or biopsies are undertaken off of  anticoagulation.   PAST MEDICAL HISTORY:  1. Patient has no known drug allergies.  2. Medications include:     a. Synthroid 0.1 mg p.o. once daily.     b. Ogen was discontinued.     c. Provera was discontinued.     d. Zocor 20 p.o. once daily.     e. Vitamin E 400 units daily.     f. Calcium 1200 mg daily.     g. Omega-3 two tablets daily.     h. Percocet p.r.n.        a. Coumadin per pharmacy protocol.  3. Medical illnesses include depression, multinodular goiter,     hyperlipidemia.  4. Surgical illnesses include DEXA 2003, bladder sling April 2003, and     breast biopsies multiple in the 1960s.  Patient has also had a partial     thyroidectomy.   FAMILY HISTORY:  Father deceased with myocardial infarction at 69.  Mother  in good health at 31.  Four siblings in fair health, some with asthma.   SOCIAL HISTORY:  Patient is divorced, has two children in the mid-40s, does  not smoke or drink.   PHYSICAL EXAMINATION:  VITAL SIGNS:  Temperature 98, blood pressure 140/80,  pulse 80 and regular, respiratory rate 18 unlabored, O2 saturation  94% room  air.  GENERAL:  Patient is alert, awake.  No distress.  HEENT:  Good facial symmetry.  Anicteric, visual fields intact, extraocular  movements intact.  NECK:  No JVD or bruits.  No supraclavicular or cervical adenopathy.  LUNGS:  Clear.  CARDIOVASCULAR:  Regular rate and rhythm.  No murmur, gallop, rub, or heave.  ABDOMEN:  Soft, nontender.  No hepatosplenomegaly.  Bowel sounds normal.  EXTREMITIES:  2 to 3+ edema up to the knees, brawny, firm.  Bilateral calf  tenderness.  Some mild distal rubor.  Intact distal pulses.  Joints normal.  NEUROLOGICAL:  Nonfocal.   ASSESSMENT:  1. Bilateral lower extremity deep vein thromboses with progression on     anticoagulation.  2. Extensive mediastinal and hilar adenopathy suspicious for lymphoma.  3. Multinodular goiter on suppression.  4. Questionable ovarian pathology by CT scan.   PLAN:  Patient is admitted for conversion to heparin.  [facility]/Dr. Edwyna Shell will  be consulted for possible mediastinoscopy for lymph node diagnosis.  Hematology will be consulted as well for further guidance regarding DVT.  I  suspect a filter may need to be placed, particularly given the need to come  off of anticoagulation while awaiting biopsy.  Further pending all of these  consultants.                                                Geoffry Paradise, M.D.    RA/MEDQ  D:  05/02/2003  T:  05/02/2003  Job:  [id number]

## 2010-11-08 NOTE — Op Note (Signed)
NAME:  [name], [name]                  ACCOUNT NO.:  [id number]   MEDICAL RECORD NO.:  [id number]          PATIENT TYPE:  INP   LOCATION:  NA                           FACILITY:  [facility]   PHYSICIAN:  Madlyn Frankel. Charlann Boxer, M.D.  DATE OF BIRTH:  01-Jul-1940   DATE OF PROCEDURE:  07/28/2006  DATE OF DISCHARGE:                               OPERATIVE REPORT   PREOPERATIVE DIAGNOSIS:  Left knee degenerative joint disease secondary  to some arthritic condition as well as some avascular necrosis post  arthroscopy.   POSTOPERATIVE DIAGNOSIS:  Left knee degenerative joint disease secondary  to some arthritic condition as well as some avascular necrosis post  arthroscopy.   PROCEDURE:  Left knee total knee replacement.   COMPONENTS USED:  DePuy rotating platform knee system, size 3 femur, 3  tibia, 12.5 posterior stabilized insert, and a 35 patellar button.   SURGEON:  Madlyn Frankel. Charlann Boxer, M.D.   ASSISTANT:  Yetta Glassman. Loreta Ave, PA   ANESTHESIA:  General, spinal.   COMPLICATIONS:  None.   DRAINS:  x1.   TOURNIQUET TIME:  55 minutes at 300 mmHg.   INDICATION FOR PROCEDURE:  [name] is a pleasant 70 year old female  who presented to the office earlier.  I have been caring for her for  some time.  She has had a recent history of arthroscopy for meniscal  debridement as well as chondromalacia debridement.   She did develop progressive discomfort and radiographs were obtained,  which revealed evidence of some AVN in the femur and tibia, perhaps post  arthroscopic versus related to pressure changes from meniscectomy and  chondroplasty.  Nevertheless, she had progressed to discomfort and  failure to respond to conservative measures and wished to proceed with  surgical intervention in the form of a knee replacement.  We reviewed  these risks and benefits of this procedure including the postoperative  expectations and therapy that she will require, infection, DVT,  stiffness, need for revision  surgery.  Consent was obtained.   PROCEDURE IN DETAIL:  The patient was brought to the operative theatre.  Once adequate anesthesia and preoperative antibiotics administered, 2 g  of Ancef, the patient was positioned supine.  A proximal thigh  tourniquet was placed on the left thigh.  The left lower extremity was  then prepped and draped in a sterile fashion from the ankle to the  tourniquet with impervious stockinette.   The leg was then prescrubbed and prepped and draped.   The midline incision was made, followed by a median parapatellar  arthrotomy with patellar subluxation rather than just eversion.   Once extensive soft tissue debridement was carried out, attention was  first directed to the patella.  Pre-cut measurement of the patella was  23 mm.  I resected down to 14 and replaced it with a 35 button, which  got me back up to a 23-24 height.  I went ahead and placed the metal  shim to protect the patella from the retractors and then during the rest  of the procedure.   At this point the femoral canal was  entered with a drill, irrigated to  prevent fat emboli.  Bonnie intramedullary rod was then passed into the  femur and at 5 degrees of valgus, I resected 10 mm of bone off the mid  and distal femur.   Following this cut, I sized the femur to be a size 3.  It was between  about 3-1/4 on the measuring device; thus, I raised the holes anteriorly  to about 3-1/4 on the guide to prevent any notch on the femur.  The  anterior, posterior and chamfer cuts were all made.  Thorough  debridement was then carried out as necessary.  The patient's distal  femur was noted to have some areas of sclerosis but no evidence of any  advancing AVN, no evidence of any collapse of the entire medial femoral  condyle.  Following this attention was directed to the trochlear box  cut, which was made off the lateral aspect of the distal femur.  Following this, attention was directed to the tibia.  Tibial  exposure  obtained including meniscectomies as well as with tibial subluxation.  The extramedullary guide was positioned through the center of the ankle  in the AP and lateral planes.  I then resected 10 mm of bone based off  the lateral aspect of the proximal tibia.   Following this cut I used the spacer block to determine that the patient  had come to full extension, and she did.   With this I went ahead and removed the pins and attended to the final  preparation of the tibia.  The tibial exposure obtained, the size 3 tray  was placed.  With the orientation set to the medial third of the tibial  tubercle, I pinned it into position and drilled and keel-punched it.  A  trial reduction was then carried out with a size 3 tibia, 3 femur, and  initially 10 mm insert.  I then trialed a 12.5 as it felt more stable,  had the 12.5 in flexion but the knee still came to full extension.  There was no difference between the 10 and the 12.5 in terms of her  extension.  The patella 20-35 button was placed and the patella tracked  without any application of pressure.  Given this, the trial components  were removed.  The final components were brought onto the field.  I  injected the knee with 60 mL of 0.25% Marcaine with epinephrine and 1 mL  of Toradol and then irrigated the knee with normal saline solution.  The  cement was mixed on the back table.  Once the cement was ready, the  components were cemented into position, first the tibia, then the femur,  then the patella.  A 12.5 spacer was introduced to the knee to allow the  knee to be brought into extension while the cement cured.  Excessive  cement debrided.  The tourniquet was let down at 55 minutes.   Following the cement curing, excessive cement was debrided around the  components as well as posteriorly in the knee.  The final 12.4 poly was  then inserted into the tibial tray without difficulty.  I again irrigated the knee with about 300 mL  of pulse lavage, inserted  the Hemovac drain deep, then reapproximated the extension mechanism  using #1 Vicryl.  The remainder was closed with 2-0 Vicryl and a running  4-0 Monocryl.  The patient then had her knee cleaned, dried and dressed  thoroughly with a sterile bulky Jones dressing.  She was then brought to  the recovery room in a stable condition following Duramorph for spinal  anesthesia.      Madlyn Frankel Charlann Boxer, M.D.  Electronically Signed     MDO/MEDQ  D:  07/28/2006  T:  07/28/2006  Job:  [id number]

## 2010-11-08 NOTE — Discharge Summary (Signed)
NAME:  [name], [name] NO.:  [id number]   MEDICAL RECORD NO.:  [id number]                   PATIENT TYPE:  INP   LOCATION:  3731                                 FACILITY:  [facility]   PHYSICIAN:  Geoffry Paradise, M.D.               DATE OF BIRTH:  March 31, 1941   DATE OF ADMISSION:  05/01/2003  DATE OF DISCHARGE:                                 DISCHARGE SUMMARY   DIAGNOSES AT THE TIME OF DISCHARGE:  1. Bilateral deep venous thromboses.  2. Non-small-cell lung carcinoma with mediastinal adenopathy but no overt     lung lesion.  3. Multinodular goiter, on thyroid replacement.  4. Hyperlipidemia.   HISTORY OF PRESENT ILLNESS:  [name] is a 70 year old white female with  multinodular goiter, hyperlipidemia, prior depression, presenting at this  time with bilateral deep venous thromboses and extensive mediastinal  adenopathy.  She has been in reasonable health, until presenting in October  after an extended bus trip with evidence for a left lower extremity DVT.  She was placed on outpatient Lovenox and then treated with Coumadin but  subsequent to which, on a therapeutic INR, developed a right lower extremity  DVT.  Hormonal replacement therapy had been discontinued and lookup for  underlying malignancy included CT of abdomen, pelvis and chest.  Findings  did reveal a suspicious right ovary, however, most notable, extensive hilar  and mediastinal adenopathy.  Outpatient followup was to have ensued but she  developed intractable lower extremity symptomatology and she was admitted  for further management.  For details, see the dictated summary on the chart.   DATA:  Transvaginal non-obstetrical ultrasound revealed a right-sided  uterine fibroid corresponding to the density on CT scan, measuring 3.0 x 3.3  cm, with the right ovary not visualized, left ovary 2 x 1 x 1 cm, inclusive  of a cyst, normal bladder, no free fluid.   Chest x-ray revealed prominent  right pulmonary hilum, no overt mass, COPD  and chronic interstitial changes.   Pathology from bronchial washes revealed no malignant cells.  Pathology from  lymph nodes revealed a non-small-cell carcinoma, not felt to be renal or  melanoma, and clearly not felt to be a lymphoma, further studies pending.  CBC:  Hemoglobin 12.6, hematocrit 36.3 with white cell count 8.9, platelet  count 242,000.  Sedimentation rate was 88 mm per hour.  Antiphospholipid  evaluation revealed PTTLA 153, elevated, PTTLA confirmation 23.5, elevated,  DRVTT elevated at 66 and lupus anticoagulant was detected.  Factor II  prothrombin was low at 43.  Chemistry:  Sodium 130, potassium 3.6, chloride  96, BUN 8, creatinine 0.7, calcium 8.4, protein 6.6, albumin 2.8, glucose  121.  Liver function testing:  SGOT 25, SGPT 22, alkaline phosphatase 62,  bilirubin 0.3, magnesium 2.1, phosphorus 2.8.  Cholesterol was 154,  triglycerides 108.  T4 was 10.2, TSH normal at 2.49.  CA125  was normal at  15.8.  CEA was elevated at 7.1.  Respiratory culture was negative.  Antiphospholipid evaluation pending.   HOSPITAL COURSE:  The patient was admitted and converted to heparin, largely  because of Coumadin failure and in preparation for biopsy.  Dr. Ines Bloomer was consulted with CVTS and the patient underwent mediastinoscopy and  bronchoscopy for washings and lymph node biopsy.  She tolerated this quite  well and heparin was restarted, post procedure.  Subsequent pathology  revealed a non-small-cell carcinoma with subsequent histochemical testing  still pending.  It was felt that the patient will need to be discharged on  Lovenox, as that Coumadin would not be successful while underlying  malignancy was uncontrolled.  She was converted to Lovenox 80 mg subcu  b.i.d. without problems.  She had already been instructed on its use with  the initial DVT and has been reinstructed.  The Lovenox Reimbursement  Services and Patient  Assistance Program was instituted.  Pain was controlled  with Percocet and MiraLax added for bowel maintenance.  Both myself and Dr.  Raymond Gurney C. Magrinat had extensive discussions regarding the underlying  pathology, non-curative nature of this illness, goals and treatment options.  She will be completing further diagnostic studies to include a barium  swallow and/or a PET scan as guided by Dr. Darnelle Catalan.  She was discharged  with further therapy to include initiation of chemotherapy and/or radiation  therapy the week of May 12, 2003.  She was counseled on smoking  throughout this, but has had no difficulty with discontinuation of smoking  and thus a patch was not instituted.  She is scheduled to see Dr. Darnelle Catalan  in the office the 19th or 21st for chemo school and initiation of  chemotherapy.  Overall prognosis is obviously poor.  We will focus on  treatment response and palliative interventions as well.                                                Geoffry Paradise, M.D.    RA/MEDQ  D:  05/05/2003  T:  05/05/2003  Job:  [id number]

## 2010-11-08 NOTE — Op Note (Signed)
NAME:  [name], [name]                            ACCOUNT NO.:  [id number]   MEDICAL RECORD NO.:  [id number]                   PATIENT TYPE:  AMB   LOCATION:  DAY                                  FACILITY:  [facility]   PHYSICIAN:  Jamison Neighbor, M.D.               DATE OF BIRTH:  December 05, 1940   DATE OF PROCEDURE:  10/11/2002  DATE OF DISCHARGE:                                 OPERATIVE REPORT   SERVICE:  Urology.   PREOPERATIVE DIAGNOSES:  Stress urinary incontinence.   POSTOPERATIVE DIAGNOSES:  Stress urinary incontinence.   PROCEDURE:  Cystoscopy and TVT to the vaginal sling.   SURGEON:  Jamison Neighbor, M.D.   ANESTHESIA:  General.   COMPLICATIONS:  None.   DRAINS:  None.   BRIEF HISTORY:  This 70 year old female has known urinary incontinence. The  patient was treated in the past with a collagen injection with some  improvement in her symptoms. We note that she had undergone gynecologic  surgery with a laparoscopic bladder suspension back in 2000. The patient had  some improvement in her symptoms with the collagen injection but it did not  last and by the fall of last year, it was clear that her stress incontinence  had returned. While she did have some evidence of urgency, she did not  require medication and it appeared that her symptoms were primarily stress  in nature. This was well demonstrated on our office evaluation. The patient  was appraised as to the possible options and she elected to have a  pubovaginal sling. She understands the risks and benefits of the procedure  including the possibility of erosion, infection, under and over correction  as well as injury to adjacent structures. She gave full and informed  consent.   DESCRIPTION OF PROCEDURE:  After successful induction of general anesthesia,  the patient was placed in the dorsal lithotomy position, prepped with  Betadine and draped in the usual sterile fashion. The labia was sutured out  to the medial thigh  in order to improve exposure. The patient appears to  have type 3 stress urinary incontinence as there is not a lot of residual  urethral mobility following the old procedure or she does have a somewhat  open bladder neck. The anterior vaginal mucosa was infiltrated with local  anesthetic. An incision was made over the mid urethra. The dissection  proceeded posteriorly back to but not through the space of Retzius. Two stab  incisions were made three fingerbreadths apart directly above the symphysis  pubis. The needle guides were passed from the abdominal incision down to the  vaginal incision. Cystoscopy was performed, the bladder was inspected, it  was free of any tumor or stones. Both ureteral orifices were normal in  configuration and location. On both sides, the needle was seen not to have  entered the bladder but on the right hand side it  appeared to be a little  closer than optimal and for that reason it was repositioned so that it was  not seen directly impinging upon the bladder. Once again , a position was  ascertained with both 12 degree and 70 degree lenses and there was no sign  of any bladder injury. The sling was then passed up to the top using the  usual TVT pusher. The sling was then positioned so that a Mayo scissors  could be passed between the urethra and the sling itself preventing over  correction. The sling protective covering was cut away setting the tension.  The sling was cut flush at the skin. The vaginal incision was irrigated and  closed with four separate figure-of-eight sutures of 2-0 Vicryl. The  cystoscopic examination showed good coaptation in the urethra but no  excessive angulation. The vagina was packed, the labial sutures were taken  down, the skin was closed with Steri-Strips, a soft dressing was applied.  The patient tolerated the procedure well and was taken to the recovery room  in good condition. Her Foley catheter will be removed and she will be  given  the opportunity to urinate if able to and she will go home today, otherwise,  she will go home with a Foley catheter and leg bag.                                               Jamison Neighbor, M.D.    RJE/MEDQ  D:  10/11/2002  T:  10/11/2002  Job:  [id number]

## 2010-11-08 NOTE — Discharge Summary (Signed)
NAME:  [name], [name]                  ACCOUNT NO.:  [id number]   MEDICAL RECORD NO.:  [id number]          PATIENT TYPE:  INP   LOCATION:  1515                         FACILITY:  [facility]   PHYSICIAN:  Madlyn Frankel. Charlann Boxer, M.D.  DATE OF BIRTH:  [birth year]   DATE OF ADMISSION:  07/28/2006  DATE OF DISCHARGE:  08/02/2006                               DISCHARGE SUMMARY   ADMITTING DIAGNOSES:  1. Osteoarthritis.  2. Chronic obstructive pulmonary disease.  3. Hiatal hernia.  4. Bronchogenic malignancy.  5. Hypercholesteremia.  6. Hypothyroidism.  7. Remote history of deep venous thrombosis.  8. Uterine fibroids.  9. Fibrocystic breast disease.   DISCHARGE DIAGNOSES:  1. Osteoarthritis.  2. Chronic obstructive pulmonary disease.  3. Hiatal hernia.  4. Bronchogenic malignancy.  5. Hypercholesterolemia.  6. Hyperthyroidism.  7. Uterine fibroids.  8. Fibrocystic breast disease.  9. Deep venous thrombosis.   CONSULTATIONS:  None.   PROCEDURE:  Left total knee arthroplasty.   SURGEON:  Dr. Durene Romans.   ASSISTANT:  Dwyane Luo, PA-C.   HISTORY OF PRESENT ILLNESS:  A 70 year old female with a history  persistent progressive knee pain.  She was refractory to all  conservative treatment.  She was seen by Dr. Charlann Boxer and evaluated and  scheduled for total knee replacement.   PREADMISSION LABS:  CBC showed her hemoglobin 15.6, hematocrit 44.9,  tracked throughout her course of stay, remained stable upon discharge,  hemoglobin 11.1, hematocrit 31.1.  Preadmission differential within  normal limits.  Preadmission coagulation INR 1.1.  Preadmission  chemistries:  Sodium 142, potassium 4.3, glucose 145, tracked throughout  her course of stay on a daily basis, sodium 137, potassium 4.4, glucose  151.  Prior to discharge sodium 134, potassium 3.8, glucose 149.  Urinalysis negative.   The EKG normal sinus rhythm.   Preadmission clearance from [facility], Dr. Jacky Kindle,   provided.   HOSPITAL COURSE:  The patient underwent total knee replacement and  tolerated procedure well, was admitted to the orthopedic floor in stable  condition.   Pain was well-controlled throughout course stay.   Postop day #1, pain was well-controlled.  She did have some nausea.  Dressing was clean, dry and intact.  Hemovac was discontinued intact.  Neuromuscularly and vascularly intact.  She had a negative straight-leg  raise although her quads would fire.  PT, OT was begun, weightbearing as  tolerated.  PT note on postop day #1 stated that the patient was not  motivated but would more than likely benefit from rehab.   Postop day #2, the patient was doing well.  Dressing was changed, wound  was intact.  No sign of infection.  No active drainage.  She was  neuromuscularly and vascularly intact.  She had a positive straight leg  raise.  PT, OT began weightbearing as tolerated with the use of rolling  walker.  Case management was requested for skilled nursing facility  placement.   OT evaluation also recommended skilled nursing facility placement.  FL-2  was signed.   Postop day #3, pain was well-controlled.  There were no new  labs.  She  was afebrile.  Dressing was changed.  There was some bloody ooze from  the Hemovac portal, but overall the wound was intact without active  drainage.  Neuromuscularly she was intact.  She had a positive straight  leg raise, continued PT, OT weightbearing as tolerated with the use of  rolling walker.  We plan to discharge to skilled nursing facility.   Postop day #4, sitting on side of bed.  No respiratory distress.  No  calf pain.  Continue serous ooze from Hemovac portal.  Wound was intact  without active drainage.  She continued to have positive straight leg  raise.  We encouraged her to be out of bed with the use of rolling  walker.  She did state that she did not want to go to a skilled nurse  facility, that she wanted to go home with  the use of home health care  physical therapy.  We felt like if she was able to walk and ambulate far  enough and was able to maintain activities of daily living, she would be  able to do so.   On postop day #3, she was doing well.  She was neuromuscularly and  vascularly intact.  Her incision looked very good.  She was ready to  home with physical therapy home health care.  The patient did state that  she knew how to give self-administered Lovenox as she had done this in  the past.   DISCHARGE DISPOSITION:  Stable and improved condition with home health  care physical therapy, Genevieve Norlander seven times per week x1 week physical  therapy.   DISCHARGE MEDICATIONS:  1. Lovenox 30 mg one subcu q.24h. x10 days.  2. Norco 5/325 one to two p.o. q.4-6h. p.r.n. pain.  3. Robaxin 500 mg one p.o. q.6h. p.r.n. muscle spasm pain.  4. Enteric-coated aspirin 325 mg after Lovenox completed x4 weeks.  5. Colace 1 mg one p.o. b.i.d. p.r.n. constipation.   DISCHARGE HOME MEDICATIONS:  1. Simvastatin one p.o. 20 mg nightly.  2. Furosemide 40 mg one p.o. q.a.m.  3. Levothyroxine 100 mcg one p.o. q.a.m.  4. Meloxicam 7.5 mg one p.o. b.i.d.  5. Fluoxetine 20 mg one p.o. q.a.m.  6 Calcium plus vitamin D 500 mg one p.o. b.i.d.   DISCHARGE PHYSICAL THERAPY:  The patient is weightbearing as tolerated  with the use of a rolling walker.  Goals of therapy will be to minimize  pain, maximize range of motion, increase muscle and quad strength.  Want  to encourage independence with activities of daily living.  The patient will be using a rolling walker for two weeks at which point  she can proceed to the use of a single point cane.   DISCHARGE FOLLOWUP:  Follow up Dr. Charlann Boxer [phone] in two weeks for an  incision and wound care check.   WOUND CARE:  Keep wound dry.  Change dressing on a daily basis.   DISCHARGE SPECIAL INSTRUCTIONS:  If the patient develops acute shortness of breath or severe calf pain please  call emergency service immediately.     ______________________________  Yetta Glassman [address], [place]      Madlyn Frankel. Charlann Boxer, M.D.  Electronically Signed    BLM/MEDQ  D:  08/28/2006  T:  08/28/2006  Job:  [id number]

## 2010-11-08 NOTE — Op Note (Signed)
NAME:  [name], [name]                            ACCOUNT NO.:  [id number]   MEDICAL RECORD NO.:  [id number]                   PATIENT TYPE:  OUT   LOCATION:  XRAY                                 FACILITY[facility]  [facility]   PHYSICIAN:  Ines Bloomer, M.D.              DATE OF BIRTH:  1941/03/10   DATE OF PROCEDURE:  DATE OF DISCHARGE:  06/07/2003                                 OPERATIVE REPORT   PREOPERATIVE DIAGNOSIS:  Mediastinal adenopathy with deep venous thrombosis.   POSTOPERATIVE DIAGNOSIS:  Metastatic non-small cell lung cancer.   OPERATION PERFORMED:  Fiberoptic bronchoscopy, right scalene node biopsy.   SURGEON:  Ines Bloomer, M.D.   ANESTHESIA:  General.   After general anesthesia, the fiberoptic bronchoscope was passed via the  endotracheal tube.  The carina was in the midline.  The right upper lobe,  right middle lobe, and right lower lobe orifices were normal.  The left  upper lobe and left lower lobe orifices were normal.  Brushings were taken  from the right upper lobe.  Then, the anterior neck was prepped and draped  in the usual sterile fashion.  A transverse incision was made over the right  supraclavicular area and dissection was carried down through the  subcutaneous tissue to the platysma with electrocautery.  The  sternocleidomastoid muscle was split.  Two Weitlaners were placed at right  angles.  There was a large node in the scalene nodes and these were  dissected out with electrocautery and sent for frozen section revealing non-  small cell lung cancer.  The muscle layer was closed with 3-0 Vicryl and the  wound was closed with 3-0 Vicryl in the subcutaneous tissue and 3-0 Vicryl  as a subcuticular stitch, Dermabond for the skin.  The patient tolerated the  procedure well and was returned to the recovery room in stable condition.                                               Ines Bloomer, M.D.    DPB/MEDQ  D:  07/11/2003  T:  07/11/2003  Job:   [id number]

## 2010-11-19 ENCOUNTER — Encounter: Payer: Self-pay | Admitting: Cardiology

## 2010-11-19 ENCOUNTER — Other Ambulatory Visit: Payer: Self-pay | Admitting: Obstetrics and Gynecology

## 2010-11-19 DIAGNOSIS — Z1231 Encounter for screening mammogram for malignant neoplasm of breast: Secondary | ICD-10-CM

## 2010-11-20 ENCOUNTER — Encounter: Payer: Self-pay | Admitting: Cardiology

## 2010-11-20 ENCOUNTER — Ambulatory Visit (INDEPENDENT_AMBULATORY_CARE_PROVIDER_SITE_OTHER): Payer: Medicare Other | Admitting: Cardiology

## 2010-11-20 VITALS — BP 102/62 | HR 68 | Ht 62.0 in | Wt 222.2 lb

## 2010-11-20 DIAGNOSIS — R0600 Dyspnea, unspecified: Secondary | ICD-10-CM

## 2010-11-20 DIAGNOSIS — I4892 Unspecified atrial flutter: Secondary | ICD-10-CM | POA: Insufficient documentation

## 2010-11-20 DIAGNOSIS — R0609 Other forms of dyspnea: Secondary | ICD-10-CM

## 2010-11-20 DIAGNOSIS — R0989 Other specified symptoms and signs involving the circulatory and respiratory systems: Secondary | ICD-10-CM

## 2010-11-20 NOTE — Patient Instructions (Signed)
We will schedule you for a nuclear stress test.  Continue on your current medications for now. If your stress test is normal we may consider decreasing or tapering off your metoprolol. Lexiscan Stress Test Your caregiver has ordered a Lexiscan Stress Test with nuclear imaging. The purpose of this test is to evaluate the blood supply to your heart muscle. This procedure is referred to as a "Non-Invasive Stress Test." This is because other than having an IV started in your vein, nothing is inserted or "invades" your body. Cardiac stress tests are done to find areas of poor blood flow to the heart by determining the extent of coronary artery disease (CAD). Some patients exercise on a treadmill, which naturally increases the blood flow to your heart. However, almost half of the patients undergoing a treadmill stress test each year are unable to exercise adequately. This is due to various medical conditions. For these patients, a pharmacologic/chemical stress agent called Eugenie Birks is used on patients without a history of asthma. This medicine will mimic walking on a treadmill by temporarily increasing your coronary blood flow. The side effects of this medicine include:  Headache.   Dizziness.   Shortness of breath.   Flushing.   Chest pain/pressure.   Feeling sick to your stomach (nauseous).   Increased heart rate.   Abdominal discomfort.   Low blood pressure.  BEFORE THE PROCEDURE  Avoid all forms of caffeine 24 hours before your test. This includes coffee, tea (even decaffeinated brands), caffeinated sodas, chocolate, cocoa and certain pain medications that contain caffeine.   Do not eat anything 6 hours before the test. In hospitalized patients, this means nothing by mouth after midnight.   Patients with diabetes that are not hospitalized (outpatients) should talk to their caregiver about how much, if any, insulin or oral diabetic medications they should take the day of the test. You  should also talk about the type of light snack that you should eat the morning of the test.   Patients with diabetes that are hospitalized (inpatients) will follow the cardiologist's written instructions that will be given to you.   Outpatients should bring a snack. Use the hospital vending machines so that you may eat right after the stress phase.   Wear comfortable shoes. There is at least an hour wait before the stress phase of nuclear scanning is done. The total procedure time is approximately 3 hours.   The following medications should be stopped 24 hours before your stress test: Beta-blockers and nitroglycerine (patches or paste should be removed 4 hours before the test).   Women, tell your caregiver if there is any possibility that you may be pregnant or if you are currently breastfeeding.  PROCEDURE There are two separate nuclear images taken using a nuclear camera. These images require a small dose of a radiotracer called an isotope. Most diagnostic nuclear medicine procedures result in low radiation exposure. Allergic reactions to the isotope may include slight redness going up the arm and pain during the injection. However, these reactions are extremely rare and usually mild. Eugenie Birks is given very rapidly over 10-15 seconds in the vein (intravenously) followed by a nuclear isotope. The medication causes the arteries in your heart to widen (dilate), and the nuclear isotope lights up those arteries so that the nuclear images are clear. Together, this shows whether the coronary blood flow is normal or abnormal. During this stress phase, you will be connected to an EKG machine. Your blood pressure and oxygen levels will be monitored  by the cardiologist and cardiac nurse. This part usually lasts 5-10 minutes. For inpatients, the procedure is done in the patient's room. For outpatients, the procedure is done in the stress lab.  The "Resting Phase" is done before the Lexiscan injection and shows  how your heart functions at rest. The "Stress Phase" is done about 1 hour after the Lexiscan injection and determines how your heart functions under stress.  LEXISCAN STRESS TEST IS USEFUL FOR DETERMINING:  The adequacy of blood flow to your heart during increased levels of activity in order to clear you for discharge home.   The extent of coronary artery blockage caused by CAD.   Your prognosis if you have suffered a heart attack.   The effectiveness of cardiac procedures done, such as an angioplasty, which can increase the circulation in your coronary arteries.   Cause(s) of chest pain/pressure.  RESULTS Not all test results are available during your visit. If your test results are not back during the visit, make an appointment with your caregiver to find out the results. Do not assume everything is normal if you have not heard from your caregiver or the medical facility. It is important for you to follow up on all of your test results. Document Released: 10/26/2008  [facility] Patient Information 2011 [place], [place].

## 2010-11-20 NOTE — Progress Notes (Signed)
[name] Date of Birth: Feb 04, 1941   History of Present Illness: Mrs. Wolken is seen at the request of Dr. Jacky Kindle for evaluation of dyspnea. She is a pleasant 70 year old white female who has a history of lung cancer and COPD. She reports that she has had symptoms of shortness of breath for the past 8 years ever since her lung cancer was diagnosed. She feels that these symptoms have been pretty stable. She does have occasional cough which begins as a tickle in her throat and is nonproductive. She has had no fever or chills. She does have a history of sleep apnea and currently uses oxygen therapy at night but hasn't used CPAP therapy recently. She does have a history of atrial fibrillation in December of 2010 following stent graft repair of an abdominal aortic aneurysm with subsequent development of bilateral lymphoceles and cellulitis. She has no known recurrence of this arrhythmia. She has no known history of coronary disease. Her last stress test was over 20 years ago. She denies any chest pain. She has no orthopnea or PND. She denies any increase in edema.  Current Outpatient Prescriptions on File Prior to Visit  Medication Sig Dispense Refill  . albuterol-ipratropium (COMBIVENT) 18-103 MCG/ACT inhaler Inhale 2 puffs into the lungs daily.        Marland Kitchen aspirin 81 MG EC tablet Take 81 mg by mouth daily.        . Calcium Carbonate-Vitamin D (CALCIUM + D PO) Take 2 tablets by mouth daily.        . Cholecalciferol (VITAMIN D) 1000 UNITS capsule Take 1,000 Units by mouth daily.        . citalopram (CELEXA) 20 MG tablet Take 20 mg by mouth daily.        . fish oil-omega-3 fatty acids 1000 MG capsule Take 1,200 mg by mouth daily.       . furosemide (LASIX) 40 MG tablet Take 40 mg by mouth daily.        Marland Kitchen levothyroxine (SYNTHROID, LEVOTHROID) 100 MCG tablet Take 100 mcg by mouth daily.        Marland Kitchen lisinopril (PRINIVIL,ZESTRIL) 20 MG tablet Take 20 mg by mouth daily.        . methocarbamol (ROBAXIN) 500  MG tablet Take 500 mg by mouth every 4 (four) hours as needed.        . metoprolol tartrate (LOPRESSOR) 25 MG tablet Take 25 mg by mouth 2 (two) times daily.        . Tapentadol HCl (NUCYNTA) 75 MG TABS Take 1 tablet by mouth as needed.        . temazepam (RESTORIL) 15 MG capsule Take 1 capsule (15 mg total) by mouth at bedtime as needed for sleep.  30 capsule  1  . tiotropium (SPIRIVA HANDIHALER) 18 MCG inhalation capsule Place 1 capsule (18 mcg total) into inhaler and inhale daily.  30 capsule  3  . DISCONTD: meloxicam (MOBIC) 7.5 MG tablet Take 7.5 mg by mouth 2 (two) times daily.        Marland Kitchen DISCONTD: oxyCODONE-acetaminophen (PERCOCET) 5-325 MG per tablet Take 1 tablet by mouth every 4 (four) hours as needed.          No Known Allergies  Past Medical History  Diagnosis Date  . COPD (chronic obstructive pulmonary disease)   . H/O: lung cancer   . DVT (deep venous thrombosis)   . AAA (abdominal aortic aneurysm)   . OSA (obstructive sleep apnea)   .  Hypothyroidism   . Hyperlipidemia   . History of uterine fibroid   . Hiatal hernia   . OA (osteoarthritis)   . History of fibrocystic disease of breast   . Small cell carcinoma of lung     NON-SMALL CELL CARCINOMA OF THE LUNG, METASTATIC TO THE SUPRACLAVICULAR AND MEDIASTINAL LYMPH NODES  . HTN (hypertension)   . Anxiety and depression   . PAD (peripheral artery disease)   . Ovarian mass     right benign    Past Surgical History  Procedure Date  . Abdominal aortic aneurysm repair     stent graft  . Replacement total knee     left  . Thyroidectomy   . Breast biopsy     both breast's - benign  . Bladder surgery     sling  . Eye surgery     x2    History  Smoking status  . Former Smoker -- 1.0 packs/day for 40 years  . Types: Cigarettes  . Quit date: 06/23/2001  Smokeless tobacco  . Never Used    History  Alcohol Use No    Family History  Problem Relation Age of Onset  . Heart disease Father   . Heart attack  Father   . Diabetes type II Mother   . Heart disease Brother   . Heart disease Sister     See's Dr. Elease Hashimoto    Review of Systems: The review of systems is positive for dyspnea.  Her prior lymphoceles have resolved. She is followed on a regular basis by Dr. Arbie Cookey for her stent graft.All other systems were reviewed and are negative.  Physical Exam: BP 102/62  Pulse 68  Ht 5\' 2"  (1.575 m)  Wt 222 lb 4 oz (100.812 kg)  BMI 40.65 kg/m2 She is an overweight white female in no acute distress. She is normocephalic, atraumatic. Pupils are equal round and reactive to light and accommodation. Sclera are clear. Oropharynx is clear. Neck is supple without JVD, adenopathy, thyromegaly, or bruits. Lungs are clear. Cardiac exam reveals a regular rate and rhythm without gallop, murmur, or click. Abdomen is soft and nontender without masses or bruits. She does have 1+ bilateral lower extremity edema. She has hyperpigmentation in her lower extremities. Her pedal pulses are palpable. Skin is warm and dry. Neurologic exam she is alert and oriented x3. Cranial nerves II through XII are intact. Her mood is appropriate. LABORATORY DATA: ECG demonstrates normal sinus rhythm with leftward axis otherwise normal. Blood work from October 17, 2010 showed a normal chemistry panel and CBC. Total cholesterol 214, triglycerides 100, HDL 36, LDL 158. Thyroid studies are normal.  Assessment / Plan:

## 2010-11-20 NOTE — Assessment & Plan Note (Addendum)
I suspect that her dyspnea is predominantly related to her underlying pulmonary disease. However we should consider possible ischemia as an etiology with her risk factors of hypercholesterolemia, former tobacco use, and family history of early coronary disease. We will therefore schedule her for a lexiscan nuclear study. If her stress test is normal it may be worthwhile either reducing or tapering off her beta blocker since this may be exacerbating her pulmonary condition. She has been on metoprolol since she had her episode of atrial flutter in 2010.

## 2010-11-20 NOTE — Assessment & Plan Note (Signed)
No recurrence since she developed this in acute hospital setting in November 2010. This was probably related to multiple cardiac stressors. No indication at this time for the need of long-term anticoagulation.

## 2010-11-22 DIAGNOSIS — IMO0002 Reserved for concepts with insufficient information to code with codable children: Secondary | ICD-10-CM

## 2010-11-22 DIAGNOSIS — I4891 Unspecified atrial fibrillation: Secondary | ICD-10-CM

## 2010-11-22 HISTORY — DX: Unspecified atrial fibrillation: I48.91

## 2010-11-22 HISTORY — DX: Reserved for concepts with insufficient information to code with codable children: IMO0002

## 2010-11-26 ENCOUNTER — Other Ambulatory Visit (HOSPITAL_COMMUNITY): Payer: Medicare Other | Admitting: Radiology

## 2010-12-03 ENCOUNTER — Other Ambulatory Visit (HOSPITAL_COMMUNITY): Payer: Medicare Other | Admitting: Radiology

## 2010-12-05 ENCOUNTER — Ambulatory Visit (HOSPITAL_COMMUNITY): Payer: Medicare Other | Attending: Cardiology | Admitting: Radiology

## 2010-12-05 VITALS — Ht 62.0 in | Wt 225.0 lb

## 2010-12-05 DIAGNOSIS — R0600 Dyspnea, unspecified: Secondary | ICD-10-CM

## 2010-12-05 DIAGNOSIS — I4949 Other premature depolarization: Secondary | ICD-10-CM

## 2010-12-05 DIAGNOSIS — R0609 Other forms of dyspnea: Secondary | ICD-10-CM | POA: Insufficient documentation

## 2010-12-05 DIAGNOSIS — R0989 Other specified symptoms and signs involving the circulatory and respiratory systems: Secondary | ICD-10-CM | POA: Insufficient documentation

## 2010-12-05 MED ORDER — TECHNETIUM TC 99M TETROFOSMIN IV KIT
33.0000 | PACK | Freq: Once | INTRAVENOUS | Status: AC | PRN
  Administered 2010-12-05: 33 via INTRAVENOUS

## 2010-12-05 MED ORDER — TECHNETIUM TC 99M TETROFOSMIN IV KIT
11.0000 | PACK | Freq: Once | INTRAVENOUS | Status: AC | PRN
  Administered 2010-12-05: 11 via INTRAVENOUS

## 2010-12-05 MED ORDER — REGADENOSON 0.4 MG/5ML IV SOLN
0.4000 mg | Freq: Once | INTRAVENOUS | Status: AC
  Administered 2010-12-05: 0.4 mg via INTRAVENOUS

## 2010-12-05 NOTE — Progress Notes (Signed)
[facility] SITE 3 NUCLEAR MED [address] [place] [address] [phone]  Cardiology Nuclear Med Study  PENI RUPARD is a 70 y.o. female [id number] 1941-05-15   Nuclear Med Background Indication for Stress Test:  Evaluation for Ischemia History:  '10 Echo:EF=60%, '10 AAA Repair Cardiac Risk Factors: Family History - CAD, History of Smoking, Hypertension, Lipids, Obesity and PVD  Symptoms:  DOE, Fatigue, Palpitations and Rapid HR   Nuclear Pre-Procedure Caffeine/Decaff Intake:  None NPO After: 10:00pm   Lungs:  Clear.  O2 sat 98% on RA. IV 0.9% NS with Angio Cath:  20g  IV Site: R Antecubital  IV Started by:  Stanton Kidney, EMT-P  Chest Size (in):  44 Cup Size: C  Height: 5\' 2"  (1.575 m)  Weight:  225 lb (102.059 kg)  BMI:  Body mass index is 41.15 kg/(m^2). Tech Comments:  Metoprolol held this am, per patient.    Nuclear Med Study 1 or 2 day study: 1 day  Stress Test Type:  Lexiscan  Reading MD: Cassell Clement, MD  Order Authorizing Provider:  Peter Swaziland, MD  Resting Radionuclide: Technetium 23m Tetrofosmin  Resting Radionuclide Dose: 11.0 mCi   Stress Radionuclide:  Technetium 56m Tetrofosmin  Stress Radionuclide Dose: 33.0 mCi           Stress Protocol Rest HR: 56 Stress HR: 71  Rest BP: 108/82 Stress BP: 113/67  Exercise Time (min): n/a METS: n/a   Predicted Max HR: 149 bpm % Max HR: 47.65 bpm Rate Pressure Product: 8023   Dose of Adenosine (mg):  n/a Dose of Lexiscan: 0.4 mg  Dose of Atropine (mg): n/a Dose of Dobutamine: n/a mcg/kg/min (at max HR)  Stress Test Technologist: Smiley Houseman, CMA-N  Nuclear Technologist:  Doyne Keel, CNMT     Rest Procedure:  Myocardial perfusion imaging was performed at rest 45 minutes following the intravenous administration of Technetium 74m Tetrofosmin.  Rest ECG: No acute changes, rare PVC.  Stress Procedure:  The patient received IV Lexiscan 0.4 mg over 15-seconds.  Technetium 82m  Tetrofosmin injected at 30-seconds.  There were no significant changes with Lexiscan, only occasional PVC's.  Quantitative spect images were obtained after a 45 minute delay.  Stress ECG: No significant ST segment change suggestive of ischemia.  QPS Raw Data Images:  Normal; no motion artifact; normal heart/lung ratio. Stress Images:  Normal homogeneous uptake in all areas of the myocardium. Rest Images:  Normal homogeneous uptake in all areas of the myocardium. Subtraction (SDS):  No evidence of ischemia. Transient Ischemic Dilatation (Normal <1.22):  1.14 Lung/Heart Ratio (Normal <0.45):  0.32  Quantitative Gated Spect Images QGS EDV:  NA  QGS ESV:  NA  QGS cine images: NA QGS EF: Study not gated  Impression Exercise Capacity:  Lexiscan with no exercise. BP Response:  Normal blood pressure response. Clinical Symptoms:  No chest pain. ECG Impression:  No significant ST segment change suggestive of ischemia. Comparison with Prior Nuclear Study: No previous nuclear study performed  Overall Impression:  Normal stress nuclear study.  No EF available.  Nongated study    [employer]

## 2010-12-06 ENCOUNTER — Telehealth: Payer: Self-pay | Admitting: *Deleted

## 2010-12-06 NOTE — Progress Notes (Signed)
Normal lexiscan. No ischemia/infarct. EF not obtained.

## 2010-12-06 NOTE — Progress Notes (Signed)
COPY ROUTED TO DR. JORDAN.Falecha L Clark ° ° °

## 2010-12-06 NOTE — Telephone Encounter (Signed)
Patient called with lab results.

## 2010-12-09 ENCOUNTER — Ambulatory Visit
Admission: RE | Admit: 2010-12-09 | Discharge: 2010-12-09 | Disposition: A | Discharge status: Home / Self Care | Payer: Medicare Other | Source: Ambulatory Visit | Attending: Obstetrics and Gynecology | Admitting: Obstetrics and Gynecology

## 2010-12-09 DIAGNOSIS — Z1231 Encounter for screening mammogram for malignant neoplasm of breast: Secondary | ICD-10-CM

## 2010-12-10 ENCOUNTER — Telehealth: Payer: Self-pay | Admitting: Cardiology

## 2010-12-10 ENCOUNTER — Encounter: Payer: Self-pay | Admitting: Nurse Practitioner

## 2010-12-10 ENCOUNTER — Inpatient Hospital Stay: Admission: AD | Admit: 2010-12-10 | Payer: Self-pay | Source: Ambulatory Visit | Admitting: Cardiology

## 2010-12-10 ENCOUNTER — Ambulatory Visit (INDEPENDENT_AMBULATORY_CARE_PROVIDER_SITE_OTHER): Payer: Medicare Other | Admitting: Nurse Practitioner

## 2010-12-10 ENCOUNTER — Inpatient Hospital Stay (HOSPITAL_COMMUNITY)
Admission: EM | Admit: 2010-12-10 | Discharge: 2010-12-12 | DRG: 309 | Disposition: A | Discharge status: Home / Self Care | Payer: Medicare Other | Attending: Cardiology | Admitting: Cardiology

## 2010-12-10 VITALS — BP 112/80 | HR 132 | Ht 62.0 in | Wt 226.0 lb

## 2010-12-10 DIAGNOSIS — I714 Abdominal aortic aneurysm, without rupture, unspecified: Secondary | ICD-10-CM | POA: Diagnosis present

## 2010-12-10 DIAGNOSIS — I4891 Unspecified atrial fibrillation: Principal | ICD-10-CM | POA: Diagnosis present

## 2010-12-10 DIAGNOSIS — D696 Thrombocytopenia, unspecified: Secondary | ICD-10-CM | POA: Diagnosis present

## 2010-12-10 DIAGNOSIS — I4892 Unspecified atrial flutter: Secondary | ICD-10-CM

## 2010-12-10 DIAGNOSIS — G4733 Obstructive sleep apnea (adult) (pediatric): Secondary | ICD-10-CM | POA: Diagnosis present

## 2010-12-10 DIAGNOSIS — N839 Noninflammatory disorder of ovary, fallopian tube and broad ligament, unspecified: Secondary | ICD-10-CM | POA: Diagnosis present

## 2010-12-10 DIAGNOSIS — M1990 Unspecified osteoarthritis, unspecified site: Secondary | ICD-10-CM | POA: Diagnosis present

## 2010-12-10 DIAGNOSIS — I10 Essential (primary) hypertension: Secondary | ICD-10-CM | POA: Diagnosis present

## 2010-12-10 DIAGNOSIS — E039 Hypothyroidism, unspecified: Secondary | ICD-10-CM | POA: Diagnosis present

## 2010-12-10 DIAGNOSIS — Z7982 Long term (current) use of aspirin: Secondary | ICD-10-CM

## 2010-12-10 DIAGNOSIS — Z6841 Body Mass Index (BMI) 40.0 and over, adult: Secondary | ICD-10-CM

## 2010-12-10 DIAGNOSIS — E785 Hyperlipidemia, unspecified: Secondary | ICD-10-CM | POA: Diagnosis present

## 2010-12-10 DIAGNOSIS — K449 Diaphragmatic hernia without obstruction or gangrene: Secondary | ICD-10-CM | POA: Diagnosis present

## 2010-12-10 DIAGNOSIS — J449 Chronic obstructive pulmonary disease, unspecified: Secondary | ICD-10-CM | POA: Diagnosis present

## 2010-12-10 DIAGNOSIS — J4489 Other specified chronic obstructive pulmonary disease: Secondary | ICD-10-CM | POA: Diagnosis present

## 2010-12-10 DIAGNOSIS — Z85118 Personal history of other malignant neoplasm of bronchus and lung: Secondary | ICD-10-CM

## 2010-12-10 DIAGNOSIS — Z96659 Presence of unspecified artificial knee joint: Secondary | ICD-10-CM

## 2010-12-10 DIAGNOSIS — Z86718 Personal history of other venous thrombosis and embolism: Secondary | ICD-10-CM

## 2010-12-10 LAB — CBC
HCT: 41.6 % (ref 36.0–46.0)
Hemoglobin: 14.5 g/dL (ref 12.0–15.0)
MCH: 34 pg (ref 26.0–34.0)
MCHC: 34.9 g/dL (ref 30.0–36.0)
MCV: 97.7 fL (ref 78.0–100.0)
Platelets: 131 10*3/uL — ABNORMAL LOW (ref 150–400)
RBC: 4.26 MIL/uL (ref 3.87–5.11)
RDW: 12.7 % (ref 11.5–15.5)
WBC: 5.9 10*3/uL (ref 4.0–10.5)

## 2010-12-10 LAB — TROPONIN I: Troponin I: <0.3 ng/mL (ref <0.30)

## 2010-12-10 LAB — COMPREHENSIVE METABOLIC PANEL
ALT: 19 U/L (ref 0–35)
AST: 22 U/L (ref 0–37)
Albumin: 3.4 g/dL — ABNORMAL LOW (ref 3.5–5.2)
Alkaline Phosphatase: 75 U/L (ref 39–117)
BUN: 16 mg/dL (ref 6–23)
CO2: 27 mEq/L (ref 19–32)
Calcium: 9.3 mg/dL (ref 8.4–10.5)
Chloride: 104 mEq/L (ref 96–112)
Creatinine, Ser: 0.66 mg/dL (ref 0.50–1.10)
GFR calc Af Amer: >60 mL/min (ref >60)
GFR calc non Af Amer: >60 mL/min (ref >60)
Glucose, Bld: 91 mg/dL (ref 70–99)
Potassium: 3.9 mEq/L (ref 3.5–5.1)
Sodium: 139 mEq/L (ref 135–145)
Total Bilirubin: 0.4 mg/dL (ref 0.3–1.2)
Total Protein: 6.6 g/dL (ref 6.0–8.3)

## 2010-12-10 LAB — PROTIME-INR
INR: 1.01 (ref 0.00–1.49)
Prothrombin Time: 13.5 seconds (ref 11.6–15.2)

## 2010-12-10 LAB — CK TOTAL AND CKMB (NOT AT ARMC)
CK, MB: 3.4 ng/mL (ref 0.3–4.0)
Relative Index: INVALID (ref 0.0–2.5)
Total CK: 79 U/L (ref 7–177)

## 2010-12-10 LAB — APTT: aPTT: 33 seconds (ref 24–37)

## 2010-12-10 LAB — TSH: TSH: 0.262 u[IU]/mL — ABNORMAL LOW (ref 0.350–4.500)

## 2010-12-10 NOTE — Progress Notes (Signed)
[name] Date of Birth: 1941/04/24   History of Present Illness: [name] is seen today for a work in visit. She is seen for Dr. Swaziland. She called earlier today with rapid heart beating. She thinks it started yesterday, but is not sure. She gets dizzy with very little activity and has had presyncope. No chest pain. She is more short of breath. She did not want to go to the ER but came here. Heart rate is in the 140's and she is back in atrial fib/atrial flutter. She is not on anticoagulation.   She was just seen by Dr. Swaziland at the end of May for a new evaluation. She has had a recent negative stress test. Her last echo was about 2 years ago. She has had a history of atrial fib following her AAA stent grafting in 2010. This is first recurrence. She says her lung cancer is in remission.   Current Outpatient Prescriptions on File Prior to Visit  Medication Sig Dispense Refill  . albuterol-ipratropium (COMBIVENT) 18-103 MCG/ACT inhaler Inhale 2 puffs into the lungs daily.        Marland Kitchen aspirin 81 MG EC tablet Take 81 mg by mouth daily.        . Calcium Carbonate-Vitamin D (CALCIUM + D PO) Take 2 tablets by mouth daily.        . Cholecalciferol (VITAMIN D) 1000 UNITS capsule Take 1,000 Units by mouth daily.        . citalopram (CELEXA) 20 MG tablet Take 20 mg by mouth daily.        . fish oil-omega-3 fatty acids 1000 MG capsule Take 1,200 mg by mouth daily.       . furosemide (LASIX) 40 MG tablet Take 40 mg by mouth daily.        Marland Kitchen levothyroxine (SYNTHROID, LEVOTHROID) 100 MCG tablet Take 100 mcg by mouth daily.        Marland Kitchen lisinopril (PRINIVIL,ZESTRIL) 20 MG tablet Take 20 mg by mouth daily.        . metoprolol tartrate (LOPRESSOR) 25 MG tablet Take 25 mg by mouth 2 (two) times daily.        . Tapentadol HCl (NUCYNTA) 75 MG TABS Take 1 tablet by mouth as needed.        . tiotropium (SPIRIVA HANDIHALER) 18 MCG inhalation capsule Place 1 capsule (18 mcg total) into inhaler and inhale daily.  30  capsule  3  . temazepam (RESTORIL) 15 MG capsule Take 1 capsule (15 mg total) by mouth at bedtime as needed for sleep.  30 capsule  1  . DISCONTD: methocarbamol (ROBAXIN) 500 MG tablet Take 500 mg by mouth every 4 (four) hours as needed.          No Known Allergies  Past Medical History  Diagnosis Date  . COPD (chronic obstructive pulmonary disease)   . H/O: lung cancer     reported in remission  . DVT (deep venous thrombosis)   . AAA (abdominal aortic aneurysm)   . OSA (obstructive sleep apnea)   . Hypothyroidism   . Hyperlipidemia   . History of uterine fibroid   . Hiatal hernia   . OA (osteoarthritis)   . History of fibrocystic disease of breast   . Small cell carcinoma of lung     NON-SMALL CELL CARCINOMA OF THE LUNG, METASTATIC TO THE SUPRACLAVICULAR AND MEDIASTINAL LYMPH NODES; in remission  . HTN (hypertension)   . Anxiety and depression   .  PAD (peripheral artery disease)   . Ovarian mass     right benign    Past Surgical History  Procedure Date  . Abdominal aortic aneurysm repair     stent graft  . Replacement total knee     left  . Thyroidectomy   . Breast biopsy     both breast's - benign  . Bladder surgery     sling  . Eye surgery     x2  . Cardiovascular stress test 2012    No ischemia    History  Smoking status  . Former Smoker -- 1.0 packs/day for 40 years  . Types: Cigarettes  . Quit date: 06/23/2001  Smokeless tobacco  . Never Used    History  Alcohol Use No    Family History  Problem Relation Age of Onset  . Heart disease Father   . Heart attack Father   . Diabetes type II Mother   . Heart disease Brother   . Heart disease Sister     See's Dr. Elease Hashimoto    Review of Systems: The review of systems is positive for palpitations, presyncope and shortness of breath.  She does use a walker to "steady herself". No falls reported. She has been on coumadin in the past. All other systems were reviewed and are negative.  Physical Exam: BP  112/80  Pulse 132  Ht 5\' 2"  (1.575 m)  Wt 226 lb (102.513 kg)  BMI 41.34 kg/m2 Patient is very pleasant and in no acute distress. Skin is warm and dry. Color is normal.  HEENT is unremarkable. Normocephalic/atraumatic. PERRL. Sclera are nonicteric. Neck is supple. No masses. No JVD. Lungs are clear. Cardiac exam shows a regular rate and rhythm. Abdomen is soft. Extremities are without edema. Gait and ROM are intact. No gross neurologic deficits noted.   LABORATORY DATA: EKG shows atrial fib, but probably atrial flutter with RVR. Rate is in the 140s   Assessment / Plan:

## 2010-12-10 NOTE — Assessment & Plan Note (Signed)
She looks to be back in atrial fib probably flutter with RVR. Duration is a little uncertain. She is symptomatic. I have talked with Dr. Elease Hashimoto. We will admit for rate control and anticoagulation. Echo will be updated as well. Patient is agreeable to this plan.

## 2010-12-10 NOTE — Telephone Encounter (Signed)
Pt calls in complaining of rapid heart rate. Had 2 episodes yesterday. And for the past one hour she has been on the couch with a rapid heart rate. Asked pt to take her pulse she states she is unable to find it and doesn't know how to take her pulse. Instructed her on how to take her pulse and she still is unable to take pulse. Pt also reports shortness of breath on exertion. Pt asking to be seen in office. Lori notified and will see pt in office and to inform pt that she may need admitted to hospital and she would do that once seen in office. Pt verbalizes understanding and stated she will be heading to office and would be here in about 30 min (approx 215pm).

## 2010-12-11 ENCOUNTER — Inpatient Hospital Stay (HOSPITAL_COMMUNITY): Payer: Medicare Other

## 2010-12-11 ENCOUNTER — Encounter: Payer: Self-pay | Admitting: Nurse Practitioner

## 2010-12-11 DIAGNOSIS — I4891 Unspecified atrial fibrillation: Secondary | ICD-10-CM

## 2010-12-11 DIAGNOSIS — I369 Nonrheumatic tricuspid valve disorder, unspecified: Secondary | ICD-10-CM

## 2010-12-11 LAB — CARDIAC PANEL(CRET KIN+CKTOT+MB+TROPI)
CK, MB: 3.2 ng/mL (ref 0.3–4.0)
Relative Index: INVALID (ref 0.0–2.5)
Total CK: 60 U/L (ref 7–177)
Troponin I: <0.3 ng/mL (ref <0.30)

## 2010-12-11 LAB — COMPREHENSIVE METABOLIC PANEL
ALT: 15 U/L (ref 0–35)
AST: 18 U/L (ref 0–37)
Albumin: 2.9 g/dL — ABNORMAL LOW (ref 3.5–5.2)
Alkaline Phosphatase: 66 U/L (ref 39–117)
BUN: 15 mg/dL (ref 6–23)
CO2: 27 mEq/L (ref 19–32)
Calcium: 8.8 mg/dL (ref 8.4–10.5)
Chloride: 106 mEq/L (ref 96–112)
Creatinine, Ser: 0.64 mg/dL (ref 0.50–1.10)
GFR calc Af Amer: >60 mL/min (ref >60)
GFR calc non Af Amer: >60 mL/min (ref >60)
Glucose, Bld: 134 mg/dL — ABNORMAL HIGH (ref 70–99)
Potassium: 3.7 mEq/L (ref 3.5–5.1)
Sodium: 140 mEq/L (ref 135–145)
Total Bilirubin: 0.3 mg/dL (ref 0.3–1.2)
Total Protein: 6.1 g/dL (ref 6.0–8.3)

## 2010-12-11 LAB — CBC
HCT: 38.3 % (ref 36.0–46.0)
Hemoglobin: 13.3 g/dL (ref 12.0–15.0)
MCH: 34 pg (ref 26.0–34.0)
MCHC: 34.7 g/dL (ref 30.0–36.0)
MCV: 98 fL (ref 78.0–100.0)
Platelets: 109 10*3/uL — ABNORMAL LOW (ref 150–400)
RBC: 3.91 MIL/uL (ref 3.87–5.11)
RDW: 12.9 % (ref 11.5–15.5)
WBC: 5 10*3/uL (ref 4.0–10.5)

## 2010-12-11 LAB — MRSA PCR SCREENING: MRSA by PCR: POSITIVE — AB

## 2010-12-12 LAB — BASIC METABOLIC PANEL
BUN: 15 mg/dL (ref 6–23)
CO2: 25 mEq/L (ref 19–32)
Calcium: 8.8 mg/dL (ref 8.4–10.5)
Chloride: 104 mEq/L (ref 96–112)
Creatinine, Ser: 0.63 mg/dL (ref 0.50–1.10)
GFR calc Af Amer: >60 mL/min (ref >60)
GFR calc non Af Amer: >60 mL/min (ref >60)
Glucose, Bld: 127 mg/dL — ABNORMAL HIGH (ref 70–99)
Potassium: 3.8 mEq/L (ref 3.5–5.1)
Sodium: 137 mEq/L (ref 135–145)

## 2010-12-12 LAB — CBC
HCT: 38.8 % (ref 36.0–46.0)
Hemoglobin: 13.5 g/dL (ref 12.0–15.0)
MCH: 34.4 pg — ABNORMAL HIGH (ref 26.0–34.0)
MCHC: 34.8 g/dL (ref 30.0–36.0)
MCV: 98.7 fL (ref 78.0–100.0)
Platelets: 115 10*3/uL — ABNORMAL LOW (ref 150–400)
RBC: 3.93 MIL/uL (ref 3.87–5.11)
RDW: 12.8 % (ref 11.5–15.5)
WBC: 4.8 10*3/uL (ref 4.0–10.5)

## 2010-12-13 NOTE — Discharge Summary (Addendum)
NAME:  [name], [name]                  ACCOUNT NO.:  [id number]  MEDICAL RECORD NO.:  [id number]  LOCATION:  2003                         FACILITY:  [facility]  PHYSICIAN:  Verne Carrow, MDDATE OF BIRTH:  1941/06/22  DATE OF ADMISSION:  12/10/2010 DATE OF DISCHARGE:  12/12/2010                              DISCHARGE SUMMARY   DISCHARGE DIAGNOSES: 1. Atrial fibrillation with rapid ventricular response.     a.     First recurrence had remote atrial fibrillation following      her abdominal aortic aneurysm stent grafting in 2010.     b.     Anticoagulated with Xarelto. 2. Hypothyroidism.     a.     TSH 0.262 this admission, so levothyroxine cut down to 75      mcg. 3. Chronic obstructive pulmonary disease. 4. History of non-small-cell carcinoma of the lung, metastatic to the     supraclavicular mediastinal lymph node, in remission. 5. Prior history of deep vein thrombosis at the time of her lung     cancer, failed Coumadin therapy and her second deep vein thrombosis     occurred while she was on Coumadin requiring Lovenox shots. 6. Abdominal aortic aneurysm. 7. Obstructive sleep apnea. 8. Hyperlipidemia. 9. History of uterine fibroid. 10.Hiatal hernia. 11.Osteoarthritis. 12.History of fibrocystic disease of the breast. 13.Hypertension. 14.Anxiety and depression. 15.Right benign ovarian mass. 16.Status post left total knee replacement. 17.Status post thyroidectomy. 18.Status post bladder sling surgery. 19.Status post eye surgery x2. 20.Thrombocytopenia with discharge platelet count of 115.  HOSPITAL COURSE:  [name] is a 70 year old female with a history of lung cancer, AAA, and COPD who was recently referred to Dr. Swaziland for a new evaluation at the end of May with a negative stress test.  Her cardiac history includes a history of atrial fibrillation following her AAA stent grafting in 2010.  She presented to Dr. Elvis Coil office with reports of rapid heart beating.  She  did not want to go to the ER, so instead was seen as a working visit at the office.  She was noted to be in atrial fibrillation with heart rates in the 140s.  She was not on anticoagulation.  Duration was uncertain.  The patient was discussed with Dr. Elease Hashimoto and the plan was to admit the patient to the hospital for rate control.  She was received by Dr. Eden Emms at the hospital who noted that she had a history of bilateral DVTs at the time of the lung cancer.  She indicated she had failed Coumadin therapy.  Her second DVT occurred while she was on Coumadin and she required Lovenox shot.  By Dr. Eden Emms discussed with pharmacy.  They state it was felt that the patient would benefit from being started on Xarelto.  She spontaneously converted to sinus rhythm on telemetry and yesterday her Cardizem drip was discontinued and she was started on diltiazem 30 mg p.o. q.6 hours and Xarelto was continued.  She was given supplemental potassium to keep her K greater than 4.  A 2-D echocardiogram was obtained with findings below.  On the day of discharge, the patient was feeling well without chest pain, shortness of breath  or palpitations.  Dr. Clifton James has seen and examined her and feels she is stable for discharge.  DISCHARGE LABS:  WBC 12.8, hemoglobin 13.5, hematocrit 38.8, platelet count 115.  Sodium 137, potassium 3.8, chloride 104, CO2 25, glucose 127, BUN 15, creatinine 0.63.  LFTs within normal limits with the exception of decreased albumin 2.9 Nov 12, 2010.  Cardiac enzymes negative x2.  TSH 0.262.  STUDIES: 1. A chest x-ray December 11, 2010, showed no active disease in one view. 2. A 2-D echocardiogram December 11, 2010, showed mild LVH.  EF 55%.  No     wall motion abnormalities.  PA pressure of 55 mmHg.  DISCHARGE MEDICATIONS: 1. Diltiazem 120 mg daily. 2. Xarelto 20 mg daily. 3. Levothyroxine 75 mcg daily.  After discussion with Dr. Clifton James,     given the fact that her TSH was suppressed,  we will decrease the     dose with a recheck with her PCP in 4-6 weeks. 4. Aspirin 81 mg every morning. 5. Calcium carbonate/vitamin D 1 tablet b.i.d. 6. Celexa 20 mg every evening. 7. Fish oil 1200 mg every morning. 8. Lasix 40 mg half tablet b.i.d. 9. Lisinopril 20 mg every evening. 10.Lopressor 25 mg b.i.d. 11.Nucynta 75 mg daily as needed for back pain. 12.Restoril 15 mg nightly for sleep. 13.Spiriva 18 mcg inhale daily. 14.Vitamin D3 1000 units every morning.  DISPOSITION:  [name] will be discharged in stable condition to home. She is instructed to increase her activity slowly and follow a heart- healthy diet.  She is to keep her IV site clean and dry.  She will follow up with Norma Fredrickson, NP, and Dr. Elvis Coil office December 18, 2010, at 1:30 p.m.Marland Kitchen She is also instructed to follow up with her PCP for recheck of her thyroid function studies in 4-6 weeks given that her dose was adjusted in this admission.  DURATION OF DISCHARGE ENCOUNTER:  Greater than 30 minutes including physician and PA time.     Ronie Spies, P.A.C.   ______________________________ Verne Carrow, MD    DD/MEDQ  D:  12/12/2010  T:  12/13/2010  Job:  [id number]  cc:   Peter M. Swaziland, M.D.  Electronically Signed by Verne Carrow MD on 12/13/2010 10:52:54 AM Electronically Signed by Ronie Spies  on 12/17/2010 02:14:05 PM

## 2010-12-13 NOTE — Consult Note (Signed)
  [name], [name]                  ACCOUNT NO.:  [id number]  MEDICAL RECORD NO.:  [id number]  LOCATION:  2917                         FACILITY:  [facility]  PHYSICIAN:  Noralyn Pick. Eden Emms, MD, FACCDATE OF BIRTH:  August 10, 1940  DATE OF CONSULTATION:  12/10/2010 DATE OF DISCHARGE:                                CONSULTATION   [name] is a 70 year old patient who was seen in the [facility] Cardiology Office by their PA, Jacki Cones.  She was sent over as a direct admission for rapid atrial fibrillation.  She had new onset of palpitations today.  In reviewing the patient's history, she has had a history of bilateral DVTs at the time of her lung cancer.  It is not clear to me if she had a hypercoagulable state at the time or not; however, she indicates that she failed Coumadin therapy and her second DVT occurred while she was on Coumadin and she required Lovenox shots. I reviewed her orders from the office and she was not written for an oral anticoagulant and was written for an IV Cardizem drip, which is appropriate and also heparin.  After talking to the pharmacy and to the patient, there did not appear to be any contraindications to Xarelto and I think it would be better for the patient given her history to be started on Xarelto.  She will be anticoagulated within 2 hours of a single p.o. dose and likely reach steady state after two to three doses.  Further plans regarding her rapid atrial fibrillation will be given by Dr. Elease Hashimoto and Dr. Swaziland.  Given that we are starting her on Xarelto, I will cancel her order for heparin.     Noralyn Pick. Eden Emms, MD, [facility]     PCN/MEDQ  D:  12/10/2010  T:  12/11/2010  Job:  [id number]  Electronically Signed by Charlton Haws MD [facility] on 12/13/2010 12:23:28 PM

## 2010-12-18 ENCOUNTER — Encounter: Payer: Self-pay | Admitting: Nurse Practitioner

## 2010-12-18 ENCOUNTER — Ambulatory Visit (INDEPENDENT_AMBULATORY_CARE_PROVIDER_SITE_OTHER): Payer: Medicare Other | Admitting: Nurse Practitioner

## 2010-12-18 VITALS — BP 100/60 | HR 70 | Wt 225.0 lb

## 2010-12-18 DIAGNOSIS — I4891 Unspecified atrial fibrillation: Secondary | ICD-10-CM

## 2010-12-18 DIAGNOSIS — I4892 Unspecified atrial flutter: Secondary | ICD-10-CM

## 2010-12-18 NOTE — Assessment & Plan Note (Signed)
She is back in sinus. Her CHAD's score is a "1" (for HTN). No history of stroke, diabetes, heart failure and she is 70 years of age. She is probably not going to be able to afford long term Xarelto. She failed on coumadin in the remote past. She remains in sinus at this time. I will discuss with Dr. Swaziland the issue of long term anticoagulation. We will see her back in one month. If she has recurrent arrhythmia we could consider ablation. She did appear to have atrial flutter. Patient is agreeable to this plan and will call if any problems develop in the interim.

## 2010-12-18 NOTE — Patient Instructions (Signed)
Stay on your current medicines. I will have you see Dr. Swaziland in about one month to discuss long term anticoagulation

## 2010-12-18 NOTE — Progress Notes (Signed)
[name] Date of Birth: 02/27/1941   History of Present Illness: [name] is seen back today for a post hospital visit. She is seen for Dr. Swaziland. I admitted her about 10 days ago with atrial fib but probably a 2:1 atrial flutter with RVR. She converted with Cardizem. She is on Xarelto. She has a history of failing on coumadin in the past. She is doing much better. No palpitations. She remains a little fatigued but that is a chronic issue. No chest pain. She is not going to be able to afford the Xarelto long term.    Current Outpatient Prescriptions on File Prior to Visit  Medication Sig Dispense Refill  . albuterol-ipratropium (COMBIVENT) 18-103 MCG/ACT inhaler Inhale 2 puffs into the lungs daily.        Marland Kitchen aspirin 81 MG EC tablet Take 81 mg by mouth daily.        . Calcium Carbonate-Vitamin D (CALCIUM + D PO) Take 2 tablets by mouth daily.        . Cholecalciferol (VITAMIN D) 1000 UNITS capsule Take 1,000 Units by mouth daily.        . citalopram (CELEXA) 20 MG tablet Take 20 mg by mouth daily.        . fish oil-omega-3 fatty acids 1000 MG capsule Take 1,200 mg by mouth daily.       . furosemide (LASIX) 40 MG tablet Take 40 mg by mouth daily.        Marland Kitchen levothyroxine (SYNTHROID, LEVOTHROID) 100 MCG tablet Take 75 mcg by mouth daily.       Marland Kitchen lisinopril (PRINIVIL,ZESTRIL) 20 MG tablet Take 20 mg by mouth daily.        . metoprolol tartrate (LOPRESSOR) 25 MG tablet Take 25 mg by mouth 2 (two) times daily.        . Rivaroxaban (XARELTO) 20 MG TABS Take by mouth daily.        . Tapentadol HCl (NUCYNTA) 75 MG TABS Take 1 tablet by mouth as needed.        . tiotropium (SPIRIVA HANDIHALER) 18 MCG inhalation capsule Place 1 capsule (18 mcg total) into inhaler and inhale daily.  30 capsule  3  . temazepam (RESTORIL) 15 MG capsule Take 1 capsule (15 mg total) by mouth at bedtime as needed for sleep.  30 capsule  1    No Known Allergies  Past Medical History  Diagnosis Date  . COPD (chronic  obstructive pulmonary disease)   . H/O: lung cancer     reported in remission  . DVT (deep venous thrombosis)   . AAA (abdominal aortic aneurysm)   . OSA (obstructive sleep apnea)   . Hypothyroidism   . Hyperlipidemia   . History of uterine fibroid   . Hiatal hernia   . OA (osteoarthritis)   . History of fibrocystic disease of breast   . Small cell carcinoma of lung     NON-SMALL CELL CARCINOMA OF THE LUNG, METASTATIC TO THE SUPRACLAVICULAR AND MEDIASTINAL LYMPH NODES; in remission  . HTN (hypertension)   . Anxiety and depression   . PAD (peripheral artery disease)   . Ovarian mass     right benign  . Anticoagulant long-term use     Failed on Coumadin. On Xarelto    Past Surgical History  Procedure Date  . Abdominal aortic aneurysm repair     stent graft  . Replacement total knee     left  . Thyroidectomy   .  Breast biopsy     both breast's - benign  . Bladder surgery     sling  . Eye surgery     x2  . Cardiovascular stress test 2012    No ischemia    History  Smoking status  . Former Smoker -- 1.0 packs/day for 40 years  . Types: Cigarettes  . Quit date: 06/23/2001  Smokeless tobacco  . Never Used    History  Alcohol Use No    Family History  Problem Relation Age of Onset  . Heart disease Father   . Heart attack Father   . Diabetes type II Mother   . Heart disease Brother   . Heart disease Sister     See's Dr. Elease Hashimoto    Review of Systems: The review of systems is positive for some fatigue. No syncope. No dizziness.  All other systems were reviewed and are negative.  Physical Exam: BP 100/60  Pulse 70  Wt 225 lb (102.059 kg) Patient is very pleasant and in no acute distress. She is obese. She is using a walker. Skin is warm and dry. Color is normal.  HEENT is unremarkable. Normocephalic/atraumatic. PERRL. Sclera are nonicteric. Neck is supple. No masses. No JVD. Lungs are clear. Cardiac exam shows a regular rate and rhythm today. She does have  frequent ectopics. Abdomen is soft. Extremities are without edema. Gait and ROM are intact. She is using a walker. No gross neurologic deficits noted.  LABORATORY DATA: EKG today shows sinus with PVC's  Assessment / Plan:

## 2011-01-17 ENCOUNTER — Encounter: Payer: Self-pay | Admitting: Nurse Practitioner

## 2011-01-17 ENCOUNTER — Ambulatory Visit (INDEPENDENT_AMBULATORY_CARE_PROVIDER_SITE_OTHER): Payer: Medicare Other | Admitting: Nurse Practitioner

## 2011-01-17 VITALS — BP 102/66 | HR 60 | Ht 62.0 in | Wt 225.4 lb

## 2011-01-17 DIAGNOSIS — I4892 Unspecified atrial flutter: Secondary | ICD-10-CM

## 2011-01-17 NOTE — Assessment & Plan Note (Signed)
She remains in sinus by physical exam. She does have awareness of her arrhythmia. Her CHADs score is 1 (HTN). I have spoken to Dr. Swaziland. She will not be able to afford long term Xarelto (or Pradaxa for that matter). Coumadin apparently does not work for her. We will continue with her aspirin therapy. We will see her back in about 4 months. Patient is agreeable to this plan and will call if any problems develop in the interim.

## 2011-01-17 NOTE — Progress Notes (Signed)
[name] Date of Birth: 12/04/1940   History of Present Illness: Alli is seen back today for her one month check. She is seen for Dr. Sw[place]il[place]. She has done well. She has had some occasional palpitations. But when she checks her heart rate on her BP machine, her heart rate is in the 70's. No tachycardia noted. She will not be able to afford Xarelto. She failed on coumadin in the past. She had been prescribed it for a DVT on the left. Follow up just a couple of weeks later showed DVT on the right. It was assumed that coumadin just doesn't work for her. She is otherwise doing well with no complaint. Dr. Jacky Kindle has recently increased her thyroid medicine.   Current Outpatient Prescriptions on File Prior to Visit  Medication Sig Dispense Refill  . albuterol-ipratropium (COMBIVENT) 18-103 MCG/ACT inhaler Inhale 2 puffs into the lungs daily.        Marland Kitchen aspirin 81 MG EC tablet Take 81 mg by mouth daily.        . Calcium Carbonate-Vitamin D (CALCIUM + D PO) Take 2 tablets by mouth daily.        . Cholecalciferol (VITAMIN D) 1000 UNITS capsule Take 1,000 Units by mouth daily.        . citalopram (CELEXA) 20 MG tablet Take 30 mg by mouth daily.       Marland Kitchen diltiazem (CARDIZEM) 120 MG tablet Take 120 mg by mouth daily.        . fish oil-omega-3 fatty acids 1000 MG capsule Take 1,200 mg by mouth daily.       . furosemide (LASIX) 40 MG tablet Take 40 mg by mouth daily.        Marland Kitchen levothyroxine (SYNTHROID, LEVOTHROID) 100 MCG tablet Take 88 mcg by mouth daily.       Marland Kitchen lisinopril (PRINIVIL,ZESTRIL) 20 MG tablet Take 20 mg by mouth daily.        . metoprolol tartrate (LOPRESSOR) 25 MG tablet Take 25 mg by mouth 2 (two) times daily.        . temazepam (RESTORIL) 15 MG capsule Take 15 mg by mouth at bedtime as needed.        . tiotropium (SPIRIVA HANDIHALER) 18 MCG inhalation capsule Place 1 capsule (18 mcg total) into inhaler and inhale daily.  30 capsule  3  . Tapentadol HCl (NUCYNTA) 75 MG TABS Take 1 tablet  by mouth as needed.        . temazepam (RESTORIL) 15 MG capsule Take 1 capsule (15 mg total) by mouth at bedtime as needed for sleep.  30 capsule  1    No Known Allergies  Past Medical History  Diagnosis Date  . COPD (chronic obstructive pulmonary disease)   . H/O: lung cancer     reported in remission  . DVT (deep venous thrombosis)   . AAA (abdominal aortic aneurysm)   . OSA (obstructive sleep apnea)   . Hypothyroidism   . Hyperlipidemia   . History of uterine fibroid   . Hiatal hernia   . OA (osteoarthritis)   . History of fibrocystic disease of breast   . Small cell carcinoma of lung     NON-SMALL CELL CARCINOMA OF THE LUNG, METASTATIC TO THE SUPRACLAVICULAR AND MEDIASTINAL LYMPH NODES; in remission  . HTN (hypertension)   . Anxiety and depression   . PAD (peripheral artery disease)   . Ovarian mass     right benign  . Anticoagulant  long-term use     Failed on Coumadin. On Xarelto  . Atrial fib/flutter, transient June 2012  . Normal nuclear stress test 2012    Past Surgical History  Procedure Date  . Abdominal aortic aneurysm repair     stent graft  . Replacement total knee     left  . Thyroidectomy   . Breast biopsy     both breast's - benign  . Bladder surgery     sling  . Eye surgery     x2  . Cardiovascular stress test 2012    No ischemia    History  Smoking status  . Former Smoker -- 1.0 packs/day for 40 years  . Types: Cigarettes  . Quit date: 06/23/2001  Smokeless tobacco  . Never Used    History  Alcohol Use No    Family History  Problem Relation Age of Onset  . Heart disease Father   . Heart attack Father   . Diabetes type II Mother   . Heart disease Brother   . Heart disease Sister     See's Dr. Elease Hashimoto    Review of Systems: The review of systems is as above. No chest pain reported. No real shortness of breath. No falls.  All other systems were reviewed and are negative.  Physical Exam: BP 102/66  Pulse 60  Ht 5\' 2"  (1.575 m)   Wt 225 lb 6.4 oz (102.241 kg)  BMI 41.23 kg/m2 Patient is very pleasant and in no acute distress. She is obese. She is using a walker. Skin is warm and dry. Color is normal.  HEENT is unremarkable. Normocephalic/atraumatic. PERRL. Sclera are nonicteric. Neck is supple. No masses. No JVD. Lungs are clear. Cardiac exam shows a regular rate and rhythm. Abdomen is obese and soft. Extremities are without edema. Gait and ROM are intact. No gross neurologic deficits noted.  LABORATORY DATA:   Assessment / Plan:

## 2011-01-17 NOTE — Patient Instructions (Signed)
You may stop the Xarelto Stay on your aspirin therapy Let us know if you have recurrent episodes of increased heart rate We will see you in about 4 months. Call for any problems.

## 2011-02-06 ENCOUNTER — Other Ambulatory Visit: Payer: Self-pay | Admitting: Vascular Surgery

## 2011-02-06 DIAGNOSIS — I714 Abdominal aortic aneurysm, without rupture: Secondary | ICD-10-CM

## 2011-03-24 ENCOUNTER — Other Ambulatory Visit: Payer: Self-pay | Admitting: Obstetrics and Gynecology

## 2011-03-24 DIAGNOSIS — N6321 Unspecified lump in the left breast, upper outer quadrant: Secondary | ICD-10-CM

## 2011-03-28 ENCOUNTER — Other Ambulatory Visit: Payer: Self-pay | Admitting: Dermatology

## 2011-03-28 ENCOUNTER — Ambulatory Visit
Admission: RE | Admit: 2011-03-28 | Discharge: 2011-03-28 | Disposition: A | Discharge status: Home / Self Care | Payer: Medicare Other | Source: Ambulatory Visit | Attending: Obstetrics and Gynecology | Admitting: Obstetrics and Gynecology

## 2011-03-28 ENCOUNTER — Other Ambulatory Visit: Payer: Self-pay | Admitting: Obstetrics and Gynecology

## 2011-03-28 DIAGNOSIS — N6321 Unspecified lump in the left breast, upper outer quadrant: Secondary | ICD-10-CM

## 2011-04-02 ENCOUNTER — Other Ambulatory Visit: Payer: Medicare Other

## 2011-04-02 ENCOUNTER — Other Ambulatory Visit: Payer: Self-pay | Admitting: Obstetrics and Gynecology

## 2011-05-05 ENCOUNTER — Encounter: Payer: Self-pay | Admitting: Vascular Surgery

## 2011-05-06 ENCOUNTER — Encounter: Payer: Self-pay | Admitting: Vascular Surgery

## 2011-05-06 ENCOUNTER — Ambulatory Visit
Admission: RE | Admit: 2011-05-06 | Discharge: 2011-05-06 | Disposition: A | Discharge status: Home / Self Care | Payer: Medicare Other | Source: Ambulatory Visit | Attending: Vascular Surgery | Admitting: Vascular Surgery

## 2011-05-06 ENCOUNTER — Ambulatory Visit (INDEPENDENT_AMBULATORY_CARE_PROVIDER_SITE_OTHER): Payer: Medicare Other | Admitting: Vascular Surgery

## 2011-05-06 VITALS — BP 128/68 | HR 40 | Resp 16 | Ht 62.0 in | Wt 226.0 lb

## 2011-05-06 DIAGNOSIS — I714 Abdominal aortic aneurysm, without rupture: Secondary | ICD-10-CM

## 2011-05-06 MED ORDER — IOHEXOL 350 MG/ML SOLN: 125.0000 mL | Freq: Once | INTRAVENOUS | Status: AC | PRN

## 2011-05-06 NOTE — Progress Notes (Signed)
The patient presents today for followup of her stent graft repair of infrarenal abdominal aortic aneurysm in October of 2010. She has no symptoms referable to this. She did have new onset of atrial fibrillation and this is being controlled with medication. She does have a known saccular descending thoracic aortic aneurysm as well. She has had no symptoms referable to this. Otherwise she is in her usual state of health.  Past Medical History  Diagnosis Date  . COPD (chronic obstructive pulmonary disease)   . H/O: lung cancer     reported in remission  . DVT (deep venous thrombosis)   . AAA (abdominal aortic aneurysm)   . OSA (obstructive sleep apnea)   . Hypothyroidism   . Hyperlipidemia   . History of uterine fibroid   . Hiatal hernia   . OA (osteoarthritis)   . History of fibrocystic disease of breast   . Small cell carcinoma of lung     NON-SMALL CELL CARCINOMA OF THE LUNG, METASTATIC TO THE SUPRACLAVICULAR AND MEDIASTINAL LYMPH NODES; in remission  . HTN (hypertension)   . Anxiety and depression   . PAD (peripheral artery disease)   . Ovarian mass     right benign  . Anticoagulant long-term use     Failed on Coumadin. On Xarelto  . Atrial fib/flutter, transient June 2012  . Normal nuclear stress test 2012    History  Substance Use Topics  . Smoking status: Former Smoker -- 1.0 packs/day for 40 years    Types: Cigarettes    Quit date: 06/23/2001  . Smokeless tobacco: Never Used  . Alcohol Use: No    Family History  Problem Relation Age of Onset  . Heart disease Father   . Heart attack Father   . Diabetes type II Mother   . Heart disease Brother   . Heart disease Sister     See's Dr. Elease Hashimoto    Allergies  Allergen Reactions  . Amoxicillin     Current outpatient prescriptions:albuterol-ipratropium (COMBIVENT) 18-103 MCG/ACT inhaler, Inhale 2 puffs into the lungs daily.  , Disp: , Rfl: ;  aspirin 81 MG EC tablet, Take 81 mg by mouth daily.  , Disp: , Rfl: ;  Calcium  Carbonate-Vitamin D (CALCIUM + D PO), Take 2 tablets by mouth daily.  , Disp: , Rfl: ;  Cholecalciferol (VITAMIN D) 1000 UNITS capsule, Take 1,000 Units by mouth daily.  , Disp: , Rfl:  citalopram (CELEXA) 20 MG tablet, Take 40 mg by mouth daily. , Disp: , Rfl: ;  diltiazem (CARDIZEM) 120 MG tablet, Take 120 mg by mouth daily.  , Disp: , Rfl: ;  fish oil-omega-3 fatty acids 1000 MG capsule, Take 1,200 mg by mouth daily. , Disp: , Rfl: ;  furosemide (LASIX) 40 MG tablet, Take 40 mg by mouth daily.  , Disp: , Rfl: ;  levothyroxine (SYNTHROID, LEVOTHROID) 100 MCG tablet, Take 88 mcg by mouth daily. , Disp: , Rfl:  lisinopril (PRINIVIL,ZESTRIL) 20 MG tablet, Take 20 mg by mouth daily.  , Disp: , Rfl: ;  metoprolol tartrate (LOPRESSOR) 25 MG tablet, Take 25 mg by mouth 2 (two) times daily.  , Disp: , Rfl: ;  morphine (MSIR) 15 MG tablet, Take 15 mg by mouth every 4 (four) hours as needed.  , Disp: , Rfl: ;  pravastatin (PRAVACHOL) 20 MG tablet, Take 20 mg by mouth daily.  , Disp: , Rfl:  temazepam (RESTORIL) 15 MG capsule, Take 15 mg by mouth at bedtime as  needed.  , Disp: , Rfl: ;  temazepam (RESTORIL) 15 MG capsule, Take 15 mg by mouth at bedtime as needed.  , Disp: , Rfl: ;  tiotropium (SPIRIVA HANDIHALER) 18 MCG inhalation capsule, Place 1 capsule (18 mcg total) into inhaler and inhale daily., Disp: 30 capsule, Rfl: 3 DISCONTD: temazepam (RESTORIL) 15 MG capsule, Take 1 capsule (15 mg total) by mouth at bedtime as needed for sleep., Disp: 30 capsule, Rfl: 1;  Tapentadol HCl (NUCYNTA) 75 MG TABS, Take 1 tablet by mouth as needed.  , Disp: , Rfl:  No current facility-administered medications for this visit. Facility-Administered Medications Ordered in Other Visits: iohexol (OMNIPAQUE) 350 MG/ML injection 125 mL, 125 mL, Intravenous, Once PRN, Medication Radiologist  BP 128/68  Pulse 40  Resp 16  Ht 5\' 2"  (1.575 m)  Wt 226 lb (102.513 kg)  BMI 41.34 kg/m2  SpO2 94%  Body mass index is 41.34  kg/(m^2).        Physical exam well-developed well-nourished moderately obese white female in no acute distress. HEENT normal. Abdomen nontender. She does have swelling in both lower extremities and does wear compression garments currently. 2+ radial pulses bilaterally.  CT scan of her chest abdomen and pelvis: I reviewed her films and discussed these with the patient. This shows excellent durable repair of her infrarenal aneurysm with no evidence of endoleak and a continued shrinking of her negative aneurysm sac. There is no change in her thoracic aneurysm with maximal diameter of the saccular aneurysm of 3.4 cm.  Impression and plan stable stent graft repair of infrarenal abdominal aortic aneurysm and stable size of descending thoracic aortic aneurysm. We will see her again in one year for continued followup. I discussed symptoms of leaking thoracic aneurysm with the patient she knows to present immediately to emerge from should this occur

## 2011-05-07 NOTE — Progress Notes (Signed)
Addended by: Sharee Pimple on: 05/07/2011 01:01 PM   Modules accepted: Orders

## 2011-05-19 ENCOUNTER — Encounter: Payer: Self-pay | Admitting: Internal Medicine

## 2011-05-19 ENCOUNTER — Ambulatory Visit (INDEPENDENT_AMBULATORY_CARE_PROVIDER_SITE_OTHER): Payer: Medicare Other | Admitting: Internal Medicine

## 2011-05-19 VITALS — BP 122/74 | HR 42 | Ht 62.0 in | Wt 227.8 lb

## 2011-05-19 DIAGNOSIS — J449 Chronic obstructive pulmonary disease, unspecified: Secondary | ICD-10-CM

## 2011-05-19 DIAGNOSIS — R05 Cough: Secondary | ICD-10-CM

## 2011-05-19 DIAGNOSIS — R059 Cough, unspecified: Secondary | ICD-10-CM

## 2011-05-19 MED ORDER — METHYLPREDNISOLONE ACETATE 80 MG/ML IJ SUSP
80.0000 mg | Freq: Once | INTRAMUSCULAR | Status: AC
  Administered 2011-05-19: 80 mg via INTRAMUSCULAR

## 2011-05-19 MED ORDER — LEVALBUTEROL HCL 0.63 MG/3ML IN NEBU
0.6300 mg | INHALATION_SOLUTION | Freq: Once | RESPIRATORY_TRACT | Status: AC
  Administered 2011-05-19: 0.63 mg via RESPIRATORY_TRACT

## 2011-05-19 NOTE — Progress Notes (Signed)
05/19/11- 89 yoF former smoker followed for COPD and OSA with hx Lung CA/xrt/chemo, DVT, AFlutter LOV- 04/16/10 Has had flu vaccine. Reports coughing up a streak of blood about 2 weeks ago. Chest has been tight, not much cough-mostly with meals. She thinks swallowing triggers her cough. More short of breath than usual. Some reflux. No chest pain or palpitation fever, sweat or enlarged glands. Using Combivent at least once most days has made her feel better. Blows nose occasionally with some blood or green. Denies headache. Continues CPAP at 12 CWP/Advanced/O2 2 L per minute for sleep Head CT scan of chest 05/06/2011 to followup of her aortic aneurysm. Showed emphysema, stable aneurysm, enlarged right lobe of thyroid, coronary disease. Images reviewed.  ROS-see HPI Constitutional:   No-   weight loss, night sweats, fevers, chills, fatigue, lassitude. HEENT:   No-  headaches, difficulty swallowing, tooth/dental problems, sore throat,       No-  sneezing, itching, ear ache, +nasal congestion, post nasal drip,  CV:  No-   chest pain, orthopnea, PND, swelling in lower extremities, anasarca,                                  dizziness, palpitations Resp:    +shortness of breath with exertion or at rest.              +   productive cough,  No non-productive cough,  + coughing up of blood.              No-   change in color of mucus.  No- wheezing.   Skin: No-   rash or lesions. GI:  No-   heartburn, indigestion, abdominal pain, nausea, vomiting, diarrhea,                 change in bowel habits, loss of appetite GU: No-   dysuria, change in color of urine, no urgency or frequency.  No- flank pain. MS:  No-   joint pain or swelling.  No- decreased range of motion.  No- back pain. Neuro-     nothing unusual Psych:  No- change in mood or affect. No depression or anxiety.  No memory loss.

## 2011-05-19 NOTE — Patient Instructions (Signed)
Order- neb xop 0.63             Depo 80  Order- schedule Modified barium swallow with Speech Therapist for cough with suspected aspiration.   If your cough continues, it may be worth having Dr [place] or Dr Jacky Kindle consider trying an alternative to lisinopril for a month.  If your cough gets worse, or you cough up more blood, please let me know.

## 2011-05-19 NOTE — Assessment & Plan Note (Signed)
She had a single event when she brought up some blood through her mouth. Posterior nose bleed is not completely excluded. Her CT scan does not show an obvious source. If it happens again we may need to discuss endo bronchial evaluation/bronchoscopy. She has a history of reflux and is recognizing cough with meals. We discussed and will get a speech therapist supported modified barium swallow. We discussed potential relationship of her cough to her ACE inhibitor. For now we will treat to chest tightness and dyspnea with nebulizer treatment and Depo-Medrol then return for followup.

## 2011-05-20 ENCOUNTER — Other Ambulatory Visit (HOSPITAL_COMMUNITY): Payer: Self-pay | Admitting: Internal Medicine

## 2011-05-20 ENCOUNTER — Ambulatory Visit: Payer: Medicare Other | Admitting: Cardiology

## 2011-05-20 DIAGNOSIS — R059 Cough, unspecified: Secondary | ICD-10-CM

## 2011-05-20 DIAGNOSIS — R05 Cough: Secondary | ICD-10-CM

## 2011-05-21 ENCOUNTER — Ambulatory Visit (INDEPENDENT_AMBULATORY_CARE_PROVIDER_SITE_OTHER): Payer: Medicare Other | Admitting: Cardiology

## 2011-05-21 ENCOUNTER — Encounter: Payer: Self-pay | Admitting: Cardiology

## 2011-05-21 VITALS — BP 104/50 | HR 40 | Ht 62.0 in | Wt 224.1 lb

## 2011-05-21 DIAGNOSIS — R059 Cough, unspecified: Secondary | ICD-10-CM

## 2011-05-21 DIAGNOSIS — R0989 Other specified symptoms and signs involving the circulatory and respiratory systems: Secondary | ICD-10-CM

## 2011-05-21 DIAGNOSIS — I4892 Unspecified atrial flutter: Secondary | ICD-10-CM

## 2011-05-21 DIAGNOSIS — R05 Cough: Secondary | ICD-10-CM

## 2011-05-21 DIAGNOSIS — R0600 Dyspnea, unspecified: Secondary | ICD-10-CM

## 2011-05-21 DIAGNOSIS — R0609 Other forms of dyspnea: Secondary | ICD-10-CM

## 2011-05-21 DIAGNOSIS — I4891 Unspecified atrial fibrillation: Secondary | ICD-10-CM

## 2011-05-21 MED ORDER — LOSARTAN POTASSIUM 50 MG PO TABS: 50.0000 mg | ORAL_TABLET | Freq: Every day | ORAL | Status: DC

## 2011-05-21 NOTE — Assessment & Plan Note (Signed)
No evidence of recurrence since her episode in June. She has a history of intolerance to Coumadin. She is no longer on Xarelto.

## 2011-05-21 NOTE — Progress Notes (Signed)
[name] Date of Birth: [birth year]   History of Present Illness: [name] is seen back today for a followup visit. She reports a 2 to three-week history of increased shortness of breath. She denies any recent upper respiratory infection. She does note some cough and when she eats and is scheduled for swallowing evaluation. She did receive a breathing treatment and shot by Dr. Maple Hudson earlier this week. She states that the breathing treatment helped temporarily. She denies any palpitations. She's had no increase in lower from the edema.  Current Outpatient Prescriptions on File Prior to Visit  Medication Sig Dispense Refill  . albuterol-ipratropium (COMBIVENT) 18-103 MCG/ACT inhaler Inhale 2 puffs into the lungs daily.        Marland Kitchen aspirin 81 MG EC tablet Take 81 mg by mouth daily.        . Calcium Carbonate-Vitamin D (CALCIUM + D PO) Take 2 tablets by mouth daily.        . Cholecalciferol (VITAMIN D) 1000 UNITS capsule Take 1,000 Units by mouth daily.        . citalopram (CELEXA) 20 MG tablet Take 40 mg by mouth daily.       Marland Kitchen diltiazem (CARDIZEM CD) 120 MG 24 hr capsule       . fish oil-omega-3 fatty acids 1000 MG capsule Take 1,200 mg by mouth daily.       . furosemide (LASIX) 40 MG tablet Take 40 mg by mouth daily.        Marland Kitchen levothyroxine (SYNTHROID, LEVOTHROID) 100 MCG tablet Take 88 mcg by mouth daily.       . metoprolol tartrate (LOPRESSOR) 25 MG tablet Take 25 mg by mouth 2 (two) times daily.        Marland Kitchen morphine (MSIR) 15 MG tablet Take 15 mg by mouth every 4 (four) hours as needed.        . pravastatin (PRAVACHOL) 20 MG tablet Take 20 mg by mouth daily.        . Tapentadol HCl (NUCYNTA) 75 MG TABS Take 1 tablet by mouth as needed.        . tiotropium (SPIRIVA HANDIHALER) 18 MCG inhalation capsule Place 1 capsule (18 mcg total) into inhaler and inhale daily.  30 capsule  3  . temazepam (RESTORIL) 15 MG capsule Take 15 mg by mouth at bedtime as needed.          Allergies  Allergen  Reactions  . Amoxicillin     Past Medical History  Diagnosis Date  . COPD (chronic obstructive pulmonary disease)   . H/O: lung cancer     reported in remission  . DVT (deep venous thrombosis)   . AAA (abdominal aortic aneurysm)   . OSA (obstructive sleep apnea)   . Hypothyroidism   . Hyperlipidemia   . History of uterine fibroid   . Hiatal hernia   . OA (osteoarthritis)   . History of fibrocystic disease of breast   . Small cell carcinoma of lung     NON-SMALL CELL CARCINOMA OF THE LUNG, METASTATIC TO THE SUPRACLAVICULAR AND MEDIASTINAL LYMPH NODES; in remission  . HTN (hypertension)   . Anxiety and depression   . PAD (peripheral artery disease)   . Ovarian mass     right benign  . Anticoagulant long-term use     Failed on Coumadin. On Xarelto  . Atrial fib/flutter, transient June 2012  . Normal nuclear stress test 2012    May 2012    Past  Surgical History  Procedure Date  . Abdominal aortic aneurysm repair     stent graft  . Replacement total knee     left  . Thyroidectomy   . Breast biopsy     both breast's - benign  . Bladder surgery     sling  . Eye surgery     x2  . Cardiovascular stress test 2012    No ischemia    History  Smoking status  . Former Smoker -- 1.0 packs/day for 40 years  . Types: Cigarettes  . Quit date: 06/23/2001  Smokeless tobacco  . Never Used    History  Alcohol Use No    Family History  Problem Relation Age of Onset  . Heart disease Father   . Heart attack Father   . Diabetes type II Mother   . Heart disease Brother   . Heart disease Sister     See's Dr. Elease Hashimoto    Review of Systems: The review of systems is as above. No chest pain reported. No real shortness of breath. No falls.  All other systems were reviewed and are negative.  Physical Exam: BP 104/50  Pulse 40  Ht 5\' 2"  (1.575 m)  Wt 224 lb 1.9 oz (101.66 kg)  BMI 40.99 kg/m2 Patient is very pleasant and in no acute distress. She is obese. She is using a  walker. Skin is warm and dry. Color is normal.  HEENT is unremarkable. Normocephalic/atraumatic. PERRL. Sclera are nonicteric. Neck is supple. No masses. No JVD. Lungs are clear. Cardiac exam shows a regular rate and rhythm. Abdomen is obese and soft. Extremities are without edema. Gait and ROM are intact. No gross neurologic deficits noted.  LABORATORY DATA: Recent CT of the chest was reviewed. This demonstrated stable descending thoracic aortic aneurysm. There are findings of COPD/emphysema. No edema or infiltrates noted.  Assessment / Plan:

## 2011-05-21 NOTE — Patient Instructions (Signed)
Stop taking lisinopril. Start losartan 50 mg daily.  Continue your other medications.  Go ahead with your swallowing evaluation with Dr. Maple Hudson.  I will see you again in 6 months.

## 2011-05-21 NOTE — Assessment & Plan Note (Addendum)
I do not see a cardiac cause for her ongoing dyspnea. She had a normal nuclear stress test in May of this year. She had an echocardiogram in June which showed normal left ventricular and valvular function. She did have moderate pulmonary hypertension. She has not had any recurrent atrial fibrillation. I recommended switching her lisinopril to losartan 50 mg daily. I've encouraged her to follow through with her swallowing evaluation.

## 2011-05-22 ENCOUNTER — Ambulatory Visit (HOSPITAL_COMMUNITY)
Admission: RE | Admit: 2011-05-22 | Discharge: 2011-05-22 | Disposition: A | Discharge status: Home / Self Care | Payer: Medicare Other | Source: Ambulatory Visit | Attending: Internal Medicine | Admitting: Internal Medicine

## 2011-05-22 DIAGNOSIS — R05 Cough: Secondary | ICD-10-CM

## 2011-05-22 DIAGNOSIS — Z85118 Personal history of other malignant neoplasm of bronchus and lung: Secondary | ICD-10-CM | POA: Insufficient documentation

## 2011-05-22 DIAGNOSIS — R0989 Other specified symptoms and signs involving the circulatory and respiratory systems: Secondary | ICD-10-CM | POA: Insufficient documentation

## 2011-05-22 DIAGNOSIS — E039 Hypothyroidism, unspecified: Secondary | ICD-10-CM | POA: Insufficient documentation

## 2011-05-22 DIAGNOSIS — R0609 Other forms of dyspnea: Secondary | ICD-10-CM | POA: Insufficient documentation

## 2011-05-22 DIAGNOSIS — R059 Cough, unspecified: Secondary | ICD-10-CM | POA: Insufficient documentation

## 2011-05-22 DIAGNOSIS — I4891 Unspecified atrial fibrillation: Secondary | ICD-10-CM | POA: Insufficient documentation

## 2011-05-22 DIAGNOSIS — J4489 Other specified chronic obstructive pulmonary disease: Secondary | ICD-10-CM | POA: Insufficient documentation

## 2011-05-22 DIAGNOSIS — K449 Diaphragmatic hernia without obstruction or gangrene: Secondary | ICD-10-CM | POA: Insufficient documentation

## 2011-05-22 DIAGNOSIS — R1319 Other dysphagia: Secondary | ICD-10-CM | POA: Insufficient documentation

## 2011-05-22 DIAGNOSIS — J449 Chronic obstructive pulmonary disease, unspecified: Secondary | ICD-10-CM | POA: Insufficient documentation

## 2011-05-22 DIAGNOSIS — G4730 Sleep apnea, unspecified: Secondary | ICD-10-CM | POA: Insufficient documentation

## 2011-05-22 NOTE — Procedures (Signed)
Modified Barium Swallow Procedure Note Patient Details  Name: [name] MRN: [id number] Date of Birth: Apr 15, 1941  Today's Date: 05/22/2011 Time: 1045  - 1130    Past Medical History:  Past Medical History  Diagnosis Date  . COPD (chronic obstructive pulmonary disease)   . H/O: lung cancer     reported in remission  . DVT (deep venous thrombosis)   . AAA (abdominal aortic aneurysm)   . OSA (obstructive sleep apnea)   . Hypothyroidism   . Hyperlipidemia   . History of uterine fibroid   . Hiatal hernia   . OA (osteoarthritis)   . History of fibrocystic disease of breast   . Small cell carcinoma of lung     NON-SMALL CELL CARCINOMA OF THE LUNG, METASTATIC TO THE SUPRACLAVICULAR AND MEDIASTINAL LYMPH NODES; in remission  . HTN (hypertension)   . Anxiety and depression   . PAD (peripheral artery disease)   . Ovarian mass     right benign  . Anticoagulant long-term use     Failed on Coumadin. On Xarelto  . Atrial fib/flutter, transient June 2012  . Normal nuclear stress test 2012    May 2012   Past Surgical History:  Past Surgical History  Procedure Date  . Abdominal aortic aneurysm repair     stent graft  . Replacement total knee     left  . Thyroidectomy   . Breast biopsy     both breast's - benign  . Bladder surgery     sling  . Eye surgery     x2  . Cardiovascular stress test 2012    No ischemia      Recommendation/Prognosis  Clinical Impression Dysphagia Diagnosis: Within Functional Limits;Suspected primary esophageal dysphagia Clinical impression: Pt presents with functional oropharyngeal swallow without aspiration or overt penetration of any consistency tested.  Trace penetration of thin is functional for pt's age and cleared with further swallows.  Pt appears to  have a primary esophageal dysphagia, please see notes regarding previous esophagram 2009.  Stasis appeared throughout esophagus after just a few boluses of thin, nectar and pudding without  clearing even with warm water and only intermittent awareness.  Esophagus appeared wide with tertiary contractions and dysmotility, radiologist not present to confirm.  Upon further questioning, pt pointed to midchest to complain of stasis during meals and getting full very quickly.  SLP suspects pt's worsening symptoms are related to possible progression of esophageal issues seen on esophagram 2009, possibly from rad tx   Recommendations Recommended Consults: Consider GI evaluation;Consider esophageal assessment Solid Consistency: Regular Liquid Consistency: Thin Compensations: Slow rate;Small sips/bites (several small meals, warm liquids may clear better) Postural Changes and/or Swallow Maneuvers: Out of bed for meals;Seated upright 90 degrees;Upright 30-60 min after meal   Individuals Consulted Consulted and Agree with Results and Recommendations: Patient Report Sent to : Referring physician;Other (comment) (PCP Dr Jacky Kindle per pt request)   General:  Other Pertinent Information: Pt is a 70 year old referred by Dr Maple Hudson for MBS to assess for possible aspiration.  PMH + for COPD, lung cancer s/p chemoradiation in 2005 per pt, sleep apnea (pt not using CPAP much d/t respiratory infection per pt), Afib, OEA, HH, depression/anxiety, thyroidectomy, thrrombocytopenia, hypothyroidism, AAA.  Pt underwent an esophagram Apr 25, 2008 indicating mild to moderate presbyesophagus, small pulsion diverticulum upper thoracic esophagus may related to previous rad tx, no hiatal hernia.  Pt reports dysphagia "for years" worsening recently, coughing 3-4 times a week with food more  than drink.  She also acknowledges coughing (that can be excessive) on postnasal drip.  Pt had SLP palpate ? nodule to right of sternal notch which she stated her pcp is aware of and has been present for "a good while".  Pt acnkowledges dypnea worsening recently, she does not report weight loss or pulmonary infections.     Type of Study:  Initial MBS Diet Prior to this Study: Regular;Thin liquids Respiratory Status: Room air Behavior/Cognition: Alert Oral Cavity - Dentition: Adequate natural dentition Oral Motor / Sensory Function: Within functional limits Vision: Functional for self-feeding Patient Positioning: Postural control adequate for testing Baseline Vocal Quality: Normal Volitional Cough: Strong Volitional Swallow: Able to elicit Anatomy: Within functional limits Ice chips: Not tested  Reason for Referral:  Dysphagia, ? aspiration   Oral Phase Oral Preparation/Oral Phase Oral Phase: WFL Pharyngeal Phase  Pharyngeal Phase Pharyngeal Phase: Within functional limits Cervical Esophageal Phase  Cervical Esophageal Phase Cervical Esophageal Phase: Leonarda Salon     Chales Abrahams 05/22/2011, 1:24 PM

## 2011-06-04 ENCOUNTER — Telehealth: Payer: Self-pay | Admitting: Internal Medicine

## 2011-06-04 NOTE — Telephone Encounter (Signed)
Dr. Maple Hudson, please advise results of barium swallow eval. Thanks!

## 2011-06-05 NOTE — Telephone Encounter (Signed)
Called and spoke with pt and informed her of Barium Swallow study and CY's recs.  Pt verbalized understanding and wanted to know if CY wanted to see her sooner to discuss further options as her next scheduled appt isn't until Nov 2013.  Please advise.  Thanks.

## 2011-06-05 NOTE — Telephone Encounter (Signed)
If she is comfortable and stable, she can just wait and see. Otherwise I would suggest seeing a GI doctor about her esophageal dysmotility. We can make a referral if needed.  I can see her about cough and breathing issues as needed.

## 2011-06-05 NOTE — Telephone Encounter (Signed)
Barium swallow indicated dysmotility, meaning food and liquid tend to stay in the esophagus longer than usual because the muscles aren't contracting actively to move things along. This could be from age or old radiation therapy.  For now, take time to eat and chew deliberately. We will discuss further options on return to office.

## 2011-06-06 MED ORDER — TIOTROPIUM BROMIDE MONOHYDRATE 18 MCG IN CAPS: 18.0000 ug | ORAL_CAPSULE | Freq: Every day | RESPIRATORY_TRACT | Status: DC

## 2011-06-06 MED ORDER — TEMAZEPAM 15 MG PO CAPS: 15.0000 mg | ORAL_CAPSULE | Freq: Every evening | ORAL | Status: DC | PRN

## 2011-06-06 NOTE — Telephone Encounter (Signed)
Per CY-okay to refill temazepam.

## 2011-06-06 NOTE — Telephone Encounter (Signed)
Called spoke with patient with CDY's recs.  Pt stated she sees Dr Marina Goodell for colonoscopy and is due to see him in the coming year.    Pt does request refills on her spiriva, and temazepam and begin pt assistance with her spiriva.  Will send refills for the spiriva.  Dr Maple Hudson, may pt have refills on the temazepam?  [facility].

## 2011-06-06 NOTE — Telephone Encounter (Signed)
Called and spoke with pt.  Pt aware rx sent for both Spiriva and Temazepam.

## 2011-06-30 ENCOUNTER — Other Ambulatory Visit: Payer: Self-pay | Admitting: *Deleted

## 2011-06-30 MED ORDER — DILTIAZEM HCL ER COATED BEADS 120 MG PO CP24: 120.0000 mg | ORAL_CAPSULE | Freq: Every day | ORAL | Status: DC

## 2011-07-21 DIAGNOSIS — R3129 Other microscopic hematuria: Secondary | ICD-10-CM | POA: Diagnosis not present

## 2011-08-06 DIAGNOSIS — M6740 Ganglion, unspecified site: Secondary | ICD-10-CM | POA: Diagnosis not present

## 2011-08-06 DIAGNOSIS — M19039 Primary osteoarthritis, unspecified wrist: Secondary | ICD-10-CM | POA: Diagnosis not present

## 2011-08-13 DIAGNOSIS — IMO0002 Reserved for concepts with insufficient information to code with codable children: Secondary | ICD-10-CM | POA: Diagnosis not present

## 2011-08-13 DIAGNOSIS — M1710 Unilateral primary osteoarthritis, unspecified knee: Secondary | ICD-10-CM | POA: Diagnosis not present

## 2011-08-14 DIAGNOSIS — M1710 Unilateral primary osteoarthritis, unspecified knee: Secondary | ICD-10-CM | POA: Diagnosis not present

## 2011-08-14 DIAGNOSIS — IMO0002 Reserved for concepts with insufficient information to code with codable children: Secondary | ICD-10-CM | POA: Diagnosis not present

## 2011-08-21 ENCOUNTER — Telehealth: Payer: Self-pay | Admitting: Internal Medicine

## 2011-08-21 MED ORDER — TEMAZEPAM 15 MG PO CAPS: 15.0000 mg | ORAL_CAPSULE | Freq: Every evening | ORAL | Status: DC | PRN

## 2011-08-21 NOTE — Telephone Encounter (Signed)
Per CY okay to refill.  

## 2011-08-21 NOTE — Telephone Encounter (Signed)
[facility]  Temazepam 15mg  cap #30  1 CAPSULE EACH NIGHT AT BEDTIME AS NEEDED OR SLEEP  Allergies  Allergen Reactions  . Amoxicillin    Dr Maple Hudson is this ok to fill  Thank you

## 2011-09-24 DIAGNOSIS — H43819 Vitreous degeneration, unspecified eye: Secondary | ICD-10-CM | POA: Diagnosis not present

## 2011-09-25 DIAGNOSIS — H43819 Vitreous degeneration, unspecified eye: Secondary | ICD-10-CM | POA: Diagnosis not present

## 2011-09-26 ENCOUNTER — Telehealth: Payer: Self-pay | Admitting: Internal Medicine

## 2011-09-26 MED ORDER — TEMAZEPAM 15 MG PO CAPS: 15.0000 mg | ORAL_CAPSULE | Freq: Every evening | ORAL | Status: DC | PRN

## 2011-09-26 NOTE — Telephone Encounter (Signed)
Last filled for pt on 08/21/2011 for #30 with no refills. Per Florentina Addison, okay to fill for #30 w/ 2 additional refills. RX called to [facility]. Left a detailed msg to let the pt know this has been taken care of and to contact the pharmacy to make sure it is ready before she goes to pick this up.

## 2011-09-26 NOTE — Telephone Encounter (Signed)
lmomtcb x1--does pt have enough to wait until cdy is in the office until Tuesday to address this

## 2011-09-26 NOTE — Telephone Encounter (Signed)
Pt called back again- wants rx for temazepam today please. [name]

## 2011-09-26 NOTE — Telephone Encounter (Signed)
lmomtcb x1--rx was already called in

## 2011-10-27 DIAGNOSIS — I10 Essential (primary) hypertension: Secondary | ICD-10-CM | POA: Diagnosis not present

## 2011-10-27 DIAGNOSIS — E785 Hyperlipidemia, unspecified: Secondary | ICD-10-CM | POA: Diagnosis not present

## 2011-10-27 DIAGNOSIS — E039 Hypothyroidism, unspecified: Secondary | ICD-10-CM | POA: Diagnosis not present

## 2011-11-03 DIAGNOSIS — I10 Essential (primary) hypertension: Secondary | ICD-10-CM | POA: Diagnosis not present

## 2011-11-03 DIAGNOSIS — Z0000 Encounter for general adult medical examination without abnormal findings: Secondary | ICD-10-CM | POA: Diagnosis not present

## 2011-11-03 DIAGNOSIS — E785 Hyperlipidemia, unspecified: Secondary | ICD-10-CM | POA: Diagnosis not present

## 2011-11-03 DIAGNOSIS — J449 Chronic obstructive pulmonary disease, unspecified: Secondary | ICD-10-CM | POA: Diagnosis not present

## 2011-11-06 DIAGNOSIS — H43819 Vitreous degeneration, unspecified eye: Secondary | ICD-10-CM | POA: Diagnosis not present

## 2011-11-19 ENCOUNTER — Encounter: Payer: Self-pay | Admitting: Cardiology

## 2011-11-19 ENCOUNTER — Ambulatory Visit (INDEPENDENT_AMBULATORY_CARE_PROVIDER_SITE_OTHER): Payer: Medicare Other | Admitting: Cardiology

## 2011-11-19 VITALS — BP 104/58 | HR 61 | Ht 62.0 in | Wt 233.0 lb

## 2011-11-19 DIAGNOSIS — R609 Edema, unspecified: Secondary | ICD-10-CM | POA: Diagnosis not present

## 2011-11-19 DIAGNOSIS — R0609 Other forms of dyspnea: Secondary | ICD-10-CM | POA: Diagnosis not present

## 2011-11-19 DIAGNOSIS — I4892 Unspecified atrial flutter: Secondary | ICD-10-CM

## 2011-11-19 DIAGNOSIS — K224 Dyskinesia of esophagus: Secondary | ICD-10-CM

## 2011-11-19 DIAGNOSIS — R0600 Dyspnea, unspecified: Secondary | ICD-10-CM

## 2011-11-19 DIAGNOSIS — J449 Chronic obstructive pulmonary disease, unspecified: Secondary | ICD-10-CM

## 2011-11-19 DIAGNOSIS — R0989 Other specified symptoms and signs involving the circulatory and respiratory systems: Secondary | ICD-10-CM

## 2011-11-19 MED ORDER — METOPROLOL TARTRATE 25 MG PO TABS: 25.0000 mg | ORAL_TABLET | Freq: Two times a day (BID) | ORAL | Status: DC

## 2011-11-19 MED ORDER — DILTIAZEM HCL ER COATED BEADS 120 MG PO CP24: 120.0000 mg | ORAL_CAPSULE | Freq: Every day | ORAL | Status: DC

## 2011-11-19 NOTE — Assessment & Plan Note (Signed)
I recommended sodium restriction and discussed appropriate dietary measures. We'll continue on her current Lasix. She is to use support hose and keep her feet elevated when possible.

## 2011-11-19 NOTE — Assessment & Plan Note (Signed)
I think her dyspnea is related to chronic pulmonary hypertension, morbid obesity, sleep apnea, and severe deconditioning. No clear cardiac etiology. I stressed the importance of resuming her CPAP therapy. Recommend followup with pulmonary.

## 2011-11-19 NOTE — Progress Notes (Signed)
[name] Date of Birth: [birth year]   History of Present Illness: [name] is seen back today for a followup visit. She was seen this past year for evaluation of dyspnea. Her cardiac evaluation was unremarkable. She had a normal nuclear stress test and echocardiogram showed normal left ventricular and valvular function. She did have moderate pulmonary hypertension felt to be related to her chronic pulmonary issues. She has a history of paroxysmal atrial flutter. She reports that over the past 6 months she has had some increase in dyspnea. She quit using her CPAP therapy 6 months ago. She did have swallowing evaluation which demonstrated no evidence of aspiration but she did have a significant esophageal dysmotility. She has not followed up with GI about this. She had been on Coumadin in the past but was intolerant. She was tried on Xarelto and quit taking this at some point. She states she has palpitations about once a month that lasts about 15 minutes. She does have chronic lower extremity edema. She does not restrict her salt intake and eats a lot of preprocessed meals. She is sedentary and has gained 9 pounds since her last visit.  Current Outpatient Prescriptions on File Prior to Visit  Medication Sig Dispense Refill  . albuterol-ipratropium (COMBIVENT) 18-103 MCG/ACT inhaler Inhale 2 puffs into the lungs daily.        Marland Kitchen aspirin 81 MG EC tablet Take 81 mg by mouth daily.        . Calcium Carbonate-Vitamin D (CALCIUM + D PO) Take 2 tablets by mouth daily.        . Cholecalciferol (VITAMIN D) 1000 UNITS capsule Take 1,000 Units by mouth daily.        . citalopram (CELEXA) 20 MG tablet Take 40 mg by mouth daily.       . fish oil-omega-3 fatty acids 1000 MG capsule Take 1,200 mg by mouth daily.       . furosemide (LASIX) 40 MG tablet Take 40 mg by mouth daily.        Marland Kitchen levothyroxine (SYNTHROID, LEVOTHROID) 100 MCG tablet Take 88 mcg by mouth daily.       Marland Kitchen losartan (COZAAR) 50 MG tablet Take 1  tablet (50 mg total) by mouth daily.  90 tablet  3  . morphine (MSIR) 15 MG tablet Take 15 mg by mouth every 4 (four) hours as needed.        . pravastatin (PRAVACHOL) 20 MG tablet Take 20 mg by mouth daily.        . temazepam (RESTORIL) 15 MG capsule Take 1 capsule (15 mg total) by mouth at bedtime as needed.  30 capsule  2  . tiotropium (SPIRIVA HANDIHALER) 18 MCG inhalation capsule Place 1 capsule (18 mcg total) into inhaler and inhale daily.  30 capsule  3  . DISCONTD: diltiazem (CARDIZEM CD) 120 MG 24 hr capsule Take 1 capsule (120 mg total) by mouth daily.  30 capsule  6  . DISCONTD: metoprolol tartrate (LOPRESSOR) 25 MG tablet Take 25 mg by mouth 2 (two) times daily.          Allergies  Allergen Reactions  . Amoxicillin     Past Medical History  Diagnosis Date  . COPD (chronic obstructive pulmonary disease)   . H/O: lung cancer     reported in remission  . DVT (deep venous thrombosis)   . AAA (abdominal aortic aneurysm)   . OSA (obstructive sleep apnea)   . Hypothyroidism   .  Hyperlipidemia   . History of uterine fibroid   . Hiatal hernia   . OA (osteoarthritis)   . History of fibrocystic disease of breast   . Small cell carcinoma of lung     NON-SMALL CELL CARCINOMA OF THE LUNG, METASTATIC TO THE SUPRACLAVICULAR AND MEDIASTINAL LYMPH NODES; in remission  . HTN (hypertension)   . Anxiety and depression   . PAD (peripheral artery disease)   . Ovarian mass     right benign  . Anticoagulant long-term use     Failed on Coumadin. On Xarelto  . Atrial fib/flutter, transient June 2012  . Normal nuclear stress test 2012    May 2012  . Esophageal dysmotility     Past Surgical History  Procedure Date  . Abdominal aortic aneurysm repair     stent graft  . Replacement total knee     left  . Thyroidectomy   . Breast biopsy     both breast's - benign  . Bladder surgery     sling  . Eye surgery     x2  . Cardiovascular stress test 2012    No ischemia    History    Smoking status  . Former Smoker -- 1.0 packs/day for 40 years  . Types: Cigarettes  . Quit date: 06/23/2001  Smokeless tobacco  . Never Used    History  Alcohol Use No    Family History  Problem Relation Age of Onset  . Heart disease Father   . Heart attack Father   . Diabetes type II Mother   . Heart disease Brother   . Heart disease Sister     See's Dr. Elease Hashimoto    Review of Systems: The review of systems is as above. No chest pain reported. No orthopnea.  All other systems were reviewed and are negative.  Physical Exam: BP 104/58  Pulse 61  Ht 5\' 2"  (1.575 m)  Wt 233 lb (105.688 kg)  BMI 42.62 kg/m2 Patient is in no acute distress. She is obese. She is using a walker. Skin is warm and dry. Color is normal.  HEENT is unremarkable. Normocephalic/atraumatic. PERRL. Sclera are nonicteric. Neck is supple. No masses. No JVD. Lungs are clear. Cardiac exam shows a regular rate and rhythm. Abdomen is obese and soft. Extremities reveal 1-2+ edema. Gait and ROM are intact. No gross neurologic deficits noted.  LABORATORY DATA: ECG demonstrates normal sinus rhythm with PVCs. She has left axis deviation. Rate is 61 beats per minute.  Assessment / Plan:

## 2011-11-19 NOTE — Patient Instructions (Signed)
You need to reduce your sodium intake. Avoid processed and canned foods and READ LABELS  Continue your current medication  Resume your CPAP  Go ahead and see Dr. Marina Goodell about your swallowing problems

## 2011-11-19 NOTE — Assessment & Plan Note (Signed)
She has a history of paroxysmal atrial flutter. It doesn't sound like she's having very frequent episodes. Anticoagulation is recommended but this has not been continued by the patient.

## 2011-11-28 ENCOUNTER — Other Ambulatory Visit: Payer: Self-pay | Admitting: *Deleted

## 2011-11-28 NOTE — Progress Notes (Signed)
Pt called stating concern regarding history of lung cancer and current symptoms- She is requesting appt with Dr Darnelle Catalan.  Appointment request giving to scheduling.

## 2011-12-02 ENCOUNTER — Telehealth: Payer: Self-pay | Admitting: Oncology

## 2011-12-02 NOTE — Telephone Encounter (Signed)
S/w the pt and she is aware of her July appt with dr Darnelle Catalan

## 2011-12-16 ENCOUNTER — Other Ambulatory Visit: Payer: Self-pay | Admitting: Obstetrics and Gynecology

## 2011-12-16 DIAGNOSIS — Z1231 Encounter for screening mammogram for malignant neoplasm of breast: Secondary | ICD-10-CM

## 2011-12-22 ENCOUNTER — Ambulatory Visit
Admission: RE | Admit: 2011-12-22 | Discharge: 2011-12-22 | Disposition: A | Discharge status: Home / Self Care | Payer: Medicare Other | Source: Ambulatory Visit | Attending: Obstetrics and Gynecology | Admitting: Obstetrics and Gynecology

## 2011-12-22 ENCOUNTER — Ambulatory Visit (HOSPITAL_BASED_OUTPATIENT_CLINIC_OR_DEPARTMENT_OTHER): Payer: Medicare Other | Admitting: Oncology

## 2011-12-22 VITALS — BP 112/71 | HR 81 | Temp 97.7°F | Ht 62.0 in | Wt 234.8 lb

## 2011-12-22 DIAGNOSIS — Z1231 Encounter for screening mammogram for malignant neoplasm of breast: Secondary | ICD-10-CM

## 2011-12-22 DIAGNOSIS — Z85118 Personal history of other malignant neoplasm of bronchus and lung: Secondary | ICD-10-CM

## 2011-12-22 NOTE — Progress Notes (Signed)
ID: [name]   DOB: Feb 28, 1941  MR#: [id number]  WUJ#:[id number]  HISTORY OF PRESENT ILLNESS: The patient went on a lengthy tour bus from February 28, 2003 through March 06, 2003.  About a week after returning, she developed left lower extremity swelling, with increasing pain which, per venous Doppler studies on April 03, 2003, showed DVT and superficial venous thrombosis. She then was placed on Coumadin and Lovenox.  As she returned on April 18, 2003 for a new Doppler evaluation, it was noticed that she developed DVT in the right leg as well.  She continued on anticoagulation.  A CT of the chest on April 21, 2003 was negative for PE. The patient was instructed to be admitted for heparinization, and possible filter placement.     In addition, the same CT of the chest also showed extensive mediastinal, hilar and supraclavicular adenopathy suggestive of lymphoma.  No suspicious lung masses were seen, only two very small pulmonary nodules.  A CT of the abdomen and pelvis was negative for adenopathy.  An enlarged right ovary  and a 3-cm abdominal aorta were the only remarkable findings. Right supraclavicular lymph node biopsy showed non-small cell lung cancer. The patient's subsequent history is as detailed below.  INTERVAL HISTORY: She returns today for followup. She had been released to her primary care physician in 2009. More recently she has been followed closely by Dr. Arbie Cookey because of her descending thoracic aneurysm.   REVIEW OF SYSTEMS: She has been feeling a little bit more short of breath than usual. She saw Peter [place] regarding this and he reassured her as far as her heart is concerned. She hasn't seen her pulmonologist Clint young in quite some time, she tells me. She feels tired most of the time, has pain in her feet and toes sometimes, but this is very intermittent and it is not more frequent or intense than previously. She denies any cough or phlegm production there has been no  hemoptysis or pleurisy. There has been no fever. She is not using her CPAP like she knows she showed, she says. She feels anxious. Overall however there are no symptoms suspicious for lung cancer recurrence.  PAST MEDICAL HISTORY: Past Medical History  Diagnosis Date  . COPD (chronic obstructive pulmonary disease)   . H/O: lung cancer     reported in remission  . DVT (deep venous thrombosis)   . AAA (abdominal aortic aneurysm)   . OSA (obstructive sleep apnea)   . Hypothyroidism   . Hyperlipidemia   . History of uterine fibroid   . Hiatal hernia   . OA (osteoarthritis)   . History of fibrocystic disease of breast   . Small cell carcinoma of lung     NON-SMALL CELL CARCINOMA OF THE LUNG, METASTATIC TO THE SUPRACLAVICULAR AND MEDIASTINAL LYMPH NODES; in remission  . HTN (hypertension)   . Anxiety and depression   . PAD (peripheral artery disease)   . Ovarian mass     right benign  . Anticoagulant long-term use     Failed on Coumadin. On Xarelto  . Atrial fib/flutter, transient June 2012  . Normal nuclear stress test 2012    May 2012  . Esophageal dysmotility     PAST SURGICAL HISTORY: Past Surgical History  Procedure Date  . Abdominal aortic aneurysm repair     stent graft  . Replacement total knee     left  . Thyroidectomy   . Breast biopsy     both breast's -  benign  . Bladder surgery     sling  . Eye surgery     x2  . Cardiovascular stress test 2012    No ischemia    FAMILY HISTORY Family History  Problem Relation Age of Onset  . Heart disease Father   . Heart attack Father   . Diabetes type II Mother   . Heart disease Brother   . Heart disease Sister     See's Dr. Elease Hashimoto  Mother lived to an advanced age..  Father died of MI at 28.  One sister alive with history of asthma.  GYNECOLOGIC HISTORY: Her last menstrual period was in her 27s.  She does have her uterus and ovaries.  She took hormone replacement for 12 years.  SOCIAL HISTORY: The patient is  divorced.  She has two children, Darl Pikes, with a history of thyroid cancer, and Annette, 45, in good health.  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  She lives alone, in a townhouse, and is very independent.    ADVANCED DIRECTIVES: Not in place  HEALTH MAINTENANCE: History  Substance Use Topics  . Smoking status: Former Smoker -- 1.0 packs/day for 40 years    Types: Cigarettes    Quit date: 06/23/2001  . Smokeless tobacco: Never Used  . Alcohol Use: No     Allergies  Allergen Reactions  . Amoxicillin     Current Outpatient Prescriptions  Medication Sig Dispense Refill  . albuterol-ipratropium (COMBIVENT) 18-103 MCG/ACT inhaler Inhale 2 puffs into the lungs daily.       . Calcium Carbonate-Vitamin D (CALCIUM + D PO) Take 2 tablets by mouth daily.        . Cholecalciferol (VITAMIN D) 1000 UNITS capsule Take 1,000 Units by mouth daily.        . citalopram (CELEXA) 20 MG tablet Take 40 mg by mouth daily.       Marland Kitchen diltiazem (CARDIZEM CD) 120 MG 24 hr capsule Take 1 capsule (120 mg total) by mouth daily.  90 capsule  3  . fish oil-omega-3 fatty acids 1000 MG capsule Take 1,200 mg by mouth daily.       . furosemide (LASIX) 40 MG tablet Take 40 mg by mouth daily.        Marland Kitchen levothyroxine (SYNTHROID, LEVOTHROID) 100 MCG tablet Take 88 mcg by mouth daily.       Marland Kitchen losartan (COZAAR) 50 MG tablet Take 1 tablet (50 mg total) by mouth daily.  90 tablet  3  . metoprolol tartrate (LOPRESSOR) 25 MG tablet Take 1 tablet (25 mg total) by mouth 2 (two) times daily.  180 tablet  3  . morphine (MSIR) 15 MG tablet Take 15 mg by mouth every 4 (four) hours as needed.        . pravastatin (PRAVACHOL) 20 MG tablet Take 20 mg by mouth daily.        . temazepam (RESTORIL) 15 MG capsule Take 1 capsule (15 mg total) by mouth at bedtime as needed.  30 capsule  2  . tiotropium (SPIRIVA HANDIHALER) 18 MCG inhalation capsule Place 1 capsule (18 mcg total) into inhaler and inhale  daily.  30 capsule  3  . aspirin 81 MG EC tablet Take 81 mg by mouth daily.         OBJECTIVE: Middle-aged white woman who appears severely deconditioned Filed Vitals:   12/22/11 1205  BP: 112/71  Pulse: 81  Temp: 97.7 F (36.5 C)  Body mass index is 42.95 kg/(m^2).    ECOG FS: 2  Sclerae unicteric Oropharynx clear No cervical or supraclavicular adenopathy Lungs no rales or rhonchi, diminished excursion bilaterally Heart regular rate and rhythm, no premature beats noted Abd benign MSK kyphosis and scoliosis but no focal spinal tenderness, Neuro: nonfocal Breasts: Deferred  LAB RESULTS: Lab Results  Component Value Date   WBC 4.8 12/12/2010   NEUTROABS 9.7* 05/17/2009   HGB 13.5 12/12/2010   HCT 38.8 12/12/2010   MCV 98.7 12/12/2010   PLT 115* 12/12/2010      Chemistry      Component Value Date/Time   NA 137 12/12/2010 0410   K 3.8 12/12/2010 0410   CL 104 12/12/2010 0410   CO2 25 12/12/2010 0410   BUN 15 12/12/2010 0410   CREATININE 0.63 12/12/2010 0410      Component Value Date/Time   CALCIUM 8.8 12/12/2010 0410   ALKPHOS 66 12/11/2010 0455   AST 18 12/11/2010 0455   ALT 15 12/11/2010 0455   BILITOT 0.3 12/11/2010 0455       No results found for this basename: LABCA2    No components found with this basename: [id number]    No results found for this basename: INR:1;PROTIME:1 in the last 168 hours  Urinalysis    Component Value Date/Time   COLORURINE YELLOW 05/29/2009 2253   APPEARANCEUR CLOUDY* 05/29/2009 2253   LABSPEC 1.015 05/29/2009 2253   PHURINE 5.0 05/29/2009 2253   GLUCOSEU NEGATIVE 05/29/2009 2253   HGBUR NEGATIVE 05/29/2009 2253   BILIRUBINUR NEGATIVE 05/29/2009 2253   KETONESUR NEGATIVE 05/29/2009 2253   PROTEINUR NEGATIVE 05/29/2009 2253   UROBILINOGEN 1.0 05/29/2009 2253   NITRITE NEGATIVE 05/29/2009 2253   LEUKOCYTESUR NEGATIVE MICROSCOPIC NOT DONE ON URINES WITH NEGATIVE PROTEIN, BLOOD, LEUKOCYTES, NITRITE, OR GLUCOSE <1000 mg/dL. 05/29/2009 2253     STUDIES: CT ANGIOGRAPHY CHEST, ABDOMEN AND PELVIS  Technique: Multidetector CT imaging through the chest, abdomen and  pelvis was performed using the standard protocol during bolus  administration of intravenous contrast. Multiplanar reconstructed  images including MIPs were obtained and reviewed to evaluate the  vascular anatomy.  Contrast: 125 ml Omnipaque 350  Comparison: CT 05/01/2018 2011  CTA CHEST  Findings:  The ascending, transverse, descending thoracic aorta are unchanged.  There is a 3.4 x 2.0 x 2.4 cm saccular aneurysm extending from the  descending thoracic aorta which is unchanged from 3.1 x 1.7 by 2.5  cm. The great vessels are normal. There is coronary artery  calcification present. No pericardial fluid. Pulmonary arteries  are grossly normal.  There is central lobular emphysema at the lung apices. No pleural  fluid. No axillary adenopathy. There is enlargement of the  inferior lobe of the right thyroid gland measuring 3.2 x 2.3 cm  which is unchanged from prior. The superior left and right lobes  are absent.  Review of the MIP images confirms the above findings.  IMPRESSION:  1. The stable saccular aneurysm the descending thoracic aorta.  3. Coronary calcification.  4. Stable enlargement of the right thyroid gland  CTA ABDOMEN AND PELVIS  Findings: There is an infrarenal aortobiiliac Endo graft. The  graft is widely patent. The upper margin of the graft is just  beneath the renal arteries unchanged in position from prior. No  evidence of Endo leak on the contrast studies. Continued interval  contraction of the excluded native aneurysm. Normal inflow in the  iliac arteries and normal outflow in the proximal femoral arteries.  The celiac trunk and SMA widely patent. Single left and right  renal arteries are patent. The kidneys enhance symmetrically.  No focal hepatic lesion. The gallbladder, pancreas, spleen,  adrenal glands are normal. The stomach, small  bowel, and cecum are  normal. Colon and rectosigmoid colon are normal. The bladder and  uterus are normal. No pelvic lymphadenopathy. Review of bone  windows demonstrates no aggressive osseous lesions.  Review of the MIP images confirms the above findings.  IMPRESSION:  Patent aortobii-liac Endo graft repair of the abdominal aortic  aneurysm. No evidence of endoleak.  Original Report Authenticated By: Genevive Bi, M.D.   ASSESSMENT: 71 y.o. [place] woman status post right supraclavicular lymph node biopsy 04/2003 for non-small cell lung cancer, status post [place] and Taxol x 5, followed by radiation, with no evidence of disease recurrence to date.  PLAN:  I reassured Avalin that she is getting more intensive followup than other lung cancer patients, since she is getting CT scans for her aneurysm. Most patients in her situation would not even have a chest x-ray yearly. I don't see any evidence of metastatic disease in her CT scans from a few months ago, and there are no symptoms that she reports to me that are suggestive of cancer. She is already scheduled for mammography today and had a colonoscopy 5 years ago with repeat planned 5 years from now.  I think Susan's breathing problems are more likely due to her past history of smoking, to deconditioning, emphysema, sleep apnea, and obesity. All these are being adequately addressd by her other physicians. Accordingly I have not made her a return appointment here, though of course I will be glad to see her on an as-needed basis in the future   Ariele Vidrio C    12/22/2011

## 2012-01-09 ENCOUNTER — Telehealth: Payer: Self-pay | Admitting: Internal Medicine

## 2012-01-09 NOTE — Telephone Encounter (Signed)
[facility]  Temazepam 15 mg <> take 1 capsule each nigh at bedtime as needed for sleep  #30 x5 Last fill 12-09-2011 Allergies  Allergen Reactions  . Amoxicillin    Dr Maple Hudson is this ok to fill  Thank you

## 2012-01-12 MED ORDER — TEMAZEPAM 15 MG PO CAPS: 15.0000 mg | ORAL_CAPSULE | Freq: Every evening | ORAL | Status: DC | PRN

## 2012-01-12 NOTE — Telephone Encounter (Signed)
PER CY OK TO FILL  

## 2012-01-12 NOTE — Telephone Encounter (Signed)
Refill authorization left on [facility] as last filled.  Med list updated.

## 2012-01-19 ENCOUNTER — Telehealth: Payer: Self-pay | Admitting: Internal Medicine

## 2012-01-19 NOTE — Telephone Encounter (Signed)
I spoke with [name] and she states she does not have an appt with [facility] until November and needs to be seen sooner. [name] states her breathing has been worsening x 2 months now. She states she can barely walk to the mailbox w/o gasping for air. She states if [facility] can not work her in soon then she wants to be worked in with someone else. Please advise Dr. Maple Hudson, thanks

## 2012-01-19 NOTE — Telephone Encounter (Signed)
Pt aware that I have made her an appt with CY on Tuesday at 1130am. Pt will come in by 1115am to get checked in.

## 2012-01-20 ENCOUNTER — Ambulatory Visit (INDEPENDENT_AMBULATORY_CARE_PROVIDER_SITE_OTHER)
Admission: RE | Admit: 2012-01-20 | Discharge: 2012-01-20 | Disposition: A | Discharge status: Home / Self Care | Payer: Medicare Other | Source: Ambulatory Visit | Attending: Internal Medicine | Admitting: Internal Medicine

## 2012-01-20 ENCOUNTER — Other Ambulatory Visit (INDEPENDENT_AMBULATORY_CARE_PROVIDER_SITE_OTHER): Payer: Medicare Other

## 2012-01-20 ENCOUNTER — Encounter: Payer: Self-pay | Admitting: Internal Medicine

## 2012-01-20 ENCOUNTER — Ambulatory Visit (INDEPENDENT_AMBULATORY_CARE_PROVIDER_SITE_OTHER): Payer: Medicare Other | Admitting: Internal Medicine

## 2012-01-20 VITALS — BP 138/72 | HR 60 | Ht 62.0 in | Wt 238.0 lb

## 2012-01-20 DIAGNOSIS — J449 Chronic obstructive pulmonary disease, unspecified: Secondary | ICD-10-CM | POA: Diagnosis not present

## 2012-01-20 DIAGNOSIS — R0609 Other forms of dyspnea: Secondary | ICD-10-CM

## 2012-01-20 DIAGNOSIS — R0600 Dyspnea, unspecified: Secondary | ICD-10-CM

## 2012-01-20 DIAGNOSIS — G4733 Obstructive sleep apnea (adult) (pediatric): Secondary | ICD-10-CM

## 2012-01-20 DIAGNOSIS — R0989 Other specified symptoms and signs involving the circulatory and respiratory systems: Secondary | ICD-10-CM

## 2012-01-20 LAB — CBC WITH DIFFERENTIAL/PLATELET
Basophils Absolute: 0 10*3/uL (ref 0.0–0.1)
Basophils Relative: 0.4 % (ref 0.0–3.0)
Eosinophils Absolute: 0.1 10*3/uL (ref 0.0–0.7)
Eosinophils Relative: 1.9 % (ref 0.0–5.0)
HCT: 43 % (ref 36.0–46.0)
Hemoglobin: 14.6 g/dL (ref 12.0–15.0)
Lymphocytes Relative: 32.9 % (ref 12.0–46.0)
Lymphs Abs: 2.1 10*3/uL (ref 0.7–4.0)
MCHC: 34 g/dL (ref 30.0–36.0)
MCV: 104.2 fl — ABNORMAL HIGH (ref 78.0–100.0)
Monocytes Absolute: 0.6 10*3/uL (ref 0.1–1.0)
Monocytes Relative: 9.6 % (ref 3.0–12.0)
Neutro Abs: 3.6 10*3/uL (ref 1.4–7.7)
Neutrophils Relative %: 55.2 % (ref 43.0–77.0)
Platelets: 171 10*3/uL (ref 150.0–400.0)
RBC: 4.13 Mil/uL (ref 3.87–5.11)
RDW: 13.8 % (ref 11.5–14.6)
WBC: 6.5 10*3/uL (ref 4.5–10.5)

## 2012-01-20 NOTE — Progress Notes (Signed)
05/19/11- 59 yoF former smoker followed for COPD and OSA with hx Lung CA/xrt/chemo, DVT, AFlutter LOV- 04/16/10 Has had flu vaccine. Reports coughing up a streak of blood about 2 weeks ago. Chest has been tight, not much cough-mostly with meals. She thinks swallowing triggers her cough. More short of breath than usual. Some reflux. No chest pain or palpitation fever, sweat or enlarged glands. Using Combivent at least once most days has made her feel better. Blows nose occasionally with some blood or green. Denies headache. Continues CPAP at 12 CWP/[facility]/O2 2 L per minute for sleep Had CT scan of chest 05/06/2011 to followup of her aortic aneurysm. Showed emphysema, stable aneurysm, enlarged right lobe of thyroid, coronary disease. Images reviewed.  01/20/12- 3 yoF former smoker followed for COPD and OSA with hx Lung CA/xrt/chemo, DVT, AFlutter Increased SOB x 2 months and getting worse; unable to do many activities Dry cough treated w/ cough drops; denies fever, sweat, chest pain.  Feet and legs stay swollen. + Hx DVT, on ASA- eval by Dr Jacky Kindle, Dr early. Cardiology has told her "heart is fine'. Dr Darnelle Catalan has told her no recurrence of lung Ca. She stopped CPAP after bad cold last winter ([facility]). Still sleeping with O2 2L/M  ROS-see HPI Constitutional:   No-   weight loss, night sweats, fevers, chills, fatigue, lassitude. HEENT:   No-  headaches, difficulty swallowing, tooth/dental problems, sore throat,       No-  sneezing, itching, ear ache, +nasal congestion, post nasal drip,  CV:  No-   chest pain, orthopnea, PND, +swelling in lower extremities, No-anasarca, dizziness,  +Occasional palpitations Resp:    +shortness of breath with exertion or at rest.              No- productive cough,  +non-productive cough,  + coughing up of blood.              No-   change in color of mucus.  No- wheezing.   Skin: No-   rash or lesions. GI:  No-   heartburn, indigestion, abdominal pain, nausea,  vomiting,  GU: N MS:  No-   joint pain or swelling.   Neuro-     nothing unusual Psych:  No- change in mood or affect. No depression or anxiety.  No memory loss.  OBJ- Physical Exam General- Alert, Oriented, Affect-appropriate, Distress- looks strained, obese Skin- rash-none, lesions- none, excoriation- none Lymphadenopathy- none Head- atraumatic            Eyes- Gross vision intact, PERRLA, conjunctivae and secretions clear            Ears- Hearing, canals-normal            Nose- Clear, no-Septal dev, mucus, polyps, erosion, perforation             Throat- Mallampati II , mucosa clear , drainage- none, tonsils- atrophic Neck- flexible , trachea midline, no stridor , thyroid nl, carotid no bruit Chest - symmetrical excursion , unlabored           Heart/CV- RRR ~60 w/ skips, no murmur , no gallop  , no rub, nl s1 s2                           - JVD- none , 1-2+chronic edema, + stasis changes- red, varices- none           Lung- clear to P&A, wheeze- none, cough- none , dullness-none, rub-  none           Chest wall-  Abd-  Br/ Gen/ Rectal- Not done, not indicated Extrem- cyanosis- none, clubbing, none, atrophy- none, strength- nl Neuro- grossly intact to observation

## 2012-01-20 NOTE — Patient Instructions (Addendum)
Order- We will contact Speech Therapy at [facility] for result of modified barium swallow done 05/22/11  Order- CXR  Dx dyspnea                  Lab- D-dimer, CBC w/ diff    Dx dyspnea               DME/ [facility]- offer nasal pillows mask and work with her to get restarted with her CPAP- had dropped off.   12 cwp plus O2 2L for sleep

## 2012-01-21 ENCOUNTER — Other Ambulatory Visit: Payer: Self-pay | Admitting: Internal Medicine

## 2012-01-21 ENCOUNTER — Ambulatory Visit (INDEPENDENT_AMBULATORY_CARE_PROVIDER_SITE_OTHER)
Admission: RE | Admit: 2012-01-21 | Discharge: 2012-01-21 | Disposition: A | Discharge status: Home / Self Care | Payer: Medicare Other | Source: Ambulatory Visit | Attending: Internal Medicine | Admitting: Internal Medicine

## 2012-01-21 ENCOUNTER — Inpatient Hospital Stay (HOSPITAL_COMMUNITY)
Admission: EM | Admit: 2012-01-21 | Discharge: 2012-01-23 | DRG: 154 | Disposition: A | Discharge status: Home / Self Care | Payer: Medicare Other | Attending: Pulmonary Disease | Admitting: Pulmonary Disease

## 2012-01-21 ENCOUNTER — Telehealth: Payer: Self-pay | Admitting: Internal Medicine

## 2012-01-21 ENCOUNTER — Telehealth: Payer: Self-pay | Admitting: Critical Care Medicine

## 2012-01-21 ENCOUNTER — Encounter (HOSPITAL_COMMUNITY): Payer: Self-pay | Admitting: *Deleted

## 2012-01-21 ENCOUNTER — Encounter (INDEPENDENT_AMBULATORY_CARE_PROVIDER_SITE_OTHER): Payer: Medicare Other

## 2012-01-21 DIAGNOSIS — R0609 Other forms of dyspnea: Secondary | ICD-10-CM

## 2012-01-21 DIAGNOSIS — E785 Hyperlipidemia, unspecified: Secondary | ICD-10-CM | POA: Diagnosis present

## 2012-01-21 DIAGNOSIS — J438 Other emphysema: Secondary | ICD-10-CM | POA: Diagnosis not present

## 2012-01-21 DIAGNOSIS — G4733 Obstructive sleep apnea (adult) (pediatric): Secondary | ICD-10-CM | POA: Diagnosis not present

## 2012-01-21 DIAGNOSIS — R609 Edema, unspecified: Secondary | ICD-10-CM

## 2012-01-21 DIAGNOSIS — J449 Chronic obstructive pulmonary disease, unspecified: Secondary | ICD-10-CM | POA: Diagnosis not present

## 2012-01-21 DIAGNOSIS — I2699 Other pulmonary embolism without acute cor pulmonale: Secondary | ICD-10-CM

## 2012-01-21 DIAGNOSIS — Z87891 Personal history of nicotine dependence: Secondary | ICD-10-CM

## 2012-01-21 DIAGNOSIS — J4489 Other specified chronic obstructive pulmonary disease: Secondary | ICD-10-CM | POA: Diagnosis present

## 2012-01-21 DIAGNOSIS — R0600 Dyspnea, unspecified: Secondary | ICD-10-CM

## 2012-01-21 DIAGNOSIS — I5033 Acute on chronic diastolic (congestive) heart failure: Secondary | ICD-10-CM | POA: Diagnosis present

## 2012-01-21 DIAGNOSIS — I82409 Acute embolism and thrombosis of unspecified deep veins of unspecified lower extremity: Secondary | ICD-10-CM | POA: Diagnosis not present

## 2012-01-21 DIAGNOSIS — Z6841 Body Mass Index (BMI) 40.0 and over, adult: Secondary | ICD-10-CM

## 2012-01-21 DIAGNOSIS — Z9119 Patient's noncompliance with other medical treatment and regimen: Secondary | ICD-10-CM | POA: Diagnosis not present

## 2012-01-21 DIAGNOSIS — R001 Bradycardia, unspecified: Secondary | ICD-10-CM

## 2012-01-21 DIAGNOSIS — E669 Obesity, unspecified: Secondary | ICD-10-CM | POA: Diagnosis not present

## 2012-01-21 DIAGNOSIS — Z86718 Personal history of other venous thrombosis and embolism: Secondary | ICD-10-CM | POA: Diagnosis not present

## 2012-01-21 DIAGNOSIS — Z7982 Long term (current) use of aspirin: Secondary | ICD-10-CM | POA: Diagnosis not present

## 2012-01-21 DIAGNOSIS — F329 Major depressive disorder, single episode, unspecified: Secondary | ICD-10-CM | POA: Diagnosis present

## 2012-01-21 DIAGNOSIS — F411 Generalized anxiety disorder: Secondary | ICD-10-CM | POA: Diagnosis present

## 2012-01-21 DIAGNOSIS — E039 Hypothyroidism, unspecified: Secondary | ICD-10-CM | POA: Diagnosis present

## 2012-01-21 DIAGNOSIS — E662 Morbid (severe) obesity with alveolar hypoventilation: Secondary | ICD-10-CM | POA: Diagnosis not present

## 2012-01-21 DIAGNOSIS — M79609 Pain in unspecified limb: Secondary | ICD-10-CM

## 2012-01-21 DIAGNOSIS — R0602 Shortness of breath: Secondary | ICD-10-CM | POA: Diagnosis not present

## 2012-01-21 DIAGNOSIS — M7989 Other specified soft tissue disorders: Secondary | ICD-10-CM

## 2012-01-21 DIAGNOSIS — I498 Other specified cardiac arrhythmias: Secondary | ICD-10-CM | POA: Diagnosis not present

## 2012-01-21 DIAGNOSIS — Z91199 Patient's noncompliance with other medical treatment and regimen due to unspecified reason: Secondary | ICD-10-CM

## 2012-01-21 DIAGNOSIS — I279 Pulmonary heart disease, unspecified: Secondary | ICD-10-CM | POA: Diagnosis present

## 2012-01-21 DIAGNOSIS — I5030 Unspecified diastolic (congestive) heart failure: Secondary | ICD-10-CM | POA: Diagnosis present

## 2012-01-21 DIAGNOSIS — F3289 Other specified depressive episodes: Secondary | ICD-10-CM | POA: Diagnosis present

## 2012-01-21 DIAGNOSIS — Z85118 Personal history of other malignant neoplasm of bronchus and lung: Secondary | ICD-10-CM | POA: Diagnosis not present

## 2012-01-21 DIAGNOSIS — I059 Rheumatic mitral valve disease, unspecified: Secondary | ICD-10-CM | POA: Diagnosis not present

## 2012-01-21 DIAGNOSIS — M1990 Unspecified osteoarthritis, unspecified site: Secondary | ICD-10-CM | POA: Diagnosis present

## 2012-01-21 DIAGNOSIS — I4892 Unspecified atrial flutter: Secondary | ICD-10-CM | POA: Diagnosis not present

## 2012-01-21 DIAGNOSIS — I10 Essential (primary) hypertension: Secondary | ICD-10-CM | POA: Diagnosis not present

## 2012-01-21 DIAGNOSIS — J90 Pleural effusion, not elsewhere classified: Secondary | ICD-10-CM | POA: Diagnosis not present

## 2012-01-21 DIAGNOSIS — Z79899 Other long term (current) drug therapy: Secondary | ICD-10-CM | POA: Diagnosis not present

## 2012-01-21 DIAGNOSIS — R0989 Other specified symptoms and signs involving the circulatory and respiratory systems: Secondary | ICD-10-CM | POA: Diagnosis not present

## 2012-01-21 HISTORY — DX: Personal history of other diseases of the respiratory system: Z87.09

## 2012-01-21 HISTORY — DX: Other chronic pain: G89.29

## 2012-01-21 HISTORY — DX: Anxiety disorder, unspecified: F41.9

## 2012-01-21 HISTORY — DX: Dyspnea, unspecified: R06.00

## 2012-01-21 HISTORY — DX: Low back pain: M54.5

## 2012-01-21 HISTORY — DX: Other forms of dyspnea: R06.09

## 2012-01-21 HISTORY — DX: Low back pain, unspecified: M54.50

## 2012-01-21 LAB — CARDIAC PANEL(CRET KIN+CKTOT+MB+TROPI)
CK, MB: 3.1 ng/mL (ref 0.3–4.0)
Relative Index: INVALID (ref 0.0–2.5)
Total CK: 83 U/L (ref 7–177)
Troponin I: <0.3 ng/mL (ref <0.30)

## 2012-01-21 LAB — CBC WITH DIFFERENTIAL/PLATELET
Basophils Absolute: 0 10*3/uL (ref 0.0–0.1)
Basophils Relative: 0 % (ref 0–1)
Eosinophils Absolute: 0.1 10*3/uL (ref 0.0–0.7)
Eosinophils Relative: 2 % (ref 0–5)
HCT: 42.2 % (ref 36.0–46.0)
Hemoglobin: 14.1 g/dL (ref 12.0–15.0)
Lymphocytes Relative: 25 % (ref 12–46)
Lymphs Abs: 1.5 10*3/uL (ref 0.7–4.0)
MCH: 33.9 pg (ref 26.0–34.0)
MCHC: 33.4 g/dL (ref 30.0–36.0)
MCV: 101.4 fL — ABNORMAL HIGH (ref 78.0–100.0)
Monocytes Absolute: 0.5 10*3/uL (ref 0.1–1.0)
Monocytes Relative: 8 % (ref 3–12)
Neutro Abs: 3.9 10*3/uL (ref 1.7–7.7)
Neutrophils Relative %: 65 % (ref 43–77)
Platelets: 141 10*3/uL — ABNORMAL LOW (ref 150–400)
RBC: 4.16 MIL/uL (ref 3.87–5.11)
RDW: 13.4 % (ref 11.5–15.5)
WBC: 5.9 10*3/uL (ref 4.0–10.5)

## 2012-01-21 LAB — COMPREHENSIVE METABOLIC PANEL
ALT: 16 U/L (ref 0–35)
AST: 24 U/L (ref 0–37)
Albumin: 3.6 g/dL (ref 3.5–5.2)
Alkaline Phosphatase: 70 U/L (ref 39–117)
BUN: 12 mg/dL (ref 6–23)
CO2: 25 mEq/L (ref 19–32)
Calcium: 9.6 mg/dL (ref 8.4–10.5)
Chloride: 103 mEq/L (ref 96–112)
Creatinine, Ser: 0.8 mg/dL (ref 0.50–1.10)
GFR calc Af Amer: 83 mL/min — ABNORMAL LOW (ref >90)
GFR calc non Af Amer: 71 mL/min — ABNORMAL LOW (ref >90)
Glucose, Bld: 80 mg/dL (ref 70–99)
Potassium: 4.2 mEq/L (ref 3.5–5.1)
Sodium: 140 mEq/L (ref 135–145)
Total Bilirubin: 0.6 mg/dL (ref 0.3–1.2)
Total Protein: 7.1 g/dL (ref 6.0–8.3)

## 2012-01-21 LAB — BASIC METABOLIC PANEL
BUN: 13 mg/dL (ref 6–23)
CO2: 27 mEq/L (ref 19–32)
Calcium: 9.6 mg/dL (ref 8.4–10.5)
Chloride: 102 mEq/L (ref 96–112)
Creatinine, Ser: 0.8 mg/dL (ref 0.50–1.10)
GFR calc Af Amer: 83 mL/min — ABNORMAL LOW (ref >90)
GFR calc non Af Amer: 71 mL/min — ABNORMAL LOW (ref >90)
Glucose, Bld: 88 mg/dL (ref 70–99)
Potassium: 4 mEq/L (ref 3.5–5.1)
Sodium: 139 mEq/L (ref 135–145)

## 2012-01-21 LAB — PROTIME-INR
INR: 1.01 (ref 0.00–1.49)
Prothrombin Time: 13.5 seconds (ref 11.6–15.2)

## 2012-01-21 LAB — PHOSPHORUS: Phosphorus: 3.5 mg/dL (ref 2.3–4.6)

## 2012-01-21 LAB — PRO B NATRIURETIC PEPTIDE: Pro B Natriuretic peptide (BNP): 1189 pg/mL — ABNORMAL HIGH (ref 0–125)

## 2012-01-21 LAB — CBC
HCT: 42.9 % (ref 36.0–46.0)
Hemoglobin: 14.2 g/dL (ref 12.0–15.0)
MCH: 33.7 pg (ref 26.0–34.0)
MCHC: 33.1 g/dL (ref 30.0–36.0)
MCV: 101.9 fL — ABNORMAL HIGH (ref 78.0–100.0)
Platelets: 144 10*3/uL — ABNORMAL LOW (ref 150–400)
RBC: 4.21 MIL/uL (ref 3.87–5.11)
RDW: 13.6 % (ref 11.5–15.5)
WBC: 5.9 10*3/uL (ref 4.0–10.5)

## 2012-01-21 LAB — MRSA PCR SCREENING: MRSA by PCR: NEGATIVE

## 2012-01-21 LAB — MAGNESIUM: Magnesium: 2 mg/dL (ref 1.5–2.5)

## 2012-01-21 LAB — APTT: aPTT: 34 seconds (ref 24–37)

## 2012-01-21 LAB — D-DIMER, QUANTITATIVE: D-Dimer, Quant: 1.87 ug/mL-FEU — ABNORMAL HIGH (ref 0.00–0.48)

## 2012-01-21 MED ORDER — VITAMIN D 1000 UNITS PO TABS
1000.0000 [IU] | ORAL_TABLET | Freq: Every day | ORAL | Status: DC
  Administered 2012-01-21 – 2012-01-23 (×3): 1000 [IU] via ORAL
  Filled 2012-01-21 (×4): qty 1

## 2012-01-21 MED ORDER — DILTIAZEM HCL ER COATED BEADS 120 MG PO CP24
120.0000 mg | ORAL_CAPSULE | Freq: Every day | ORAL | Status: DC
  Administered 2012-01-21 – 2012-01-23 (×3): 120 mg via ORAL
  Filled 2012-01-21 (×4): qty 1

## 2012-01-21 MED ORDER — ASPIRIN 81 MG PO TBEC: 81.0000 mg | DELAYED_RELEASE_TABLET | Freq: Every day | ORAL | Status: DC

## 2012-01-21 MED ORDER — CALCIUM CARBONATE-VITAMIN D 250-125 MG-UNIT PO TABS: 2.0000 | ORAL_TABLET | Freq: Every day | ORAL | Status: DC

## 2012-01-21 MED ORDER — DOCUSATE SODIUM 100 MG PO CAPS
100.0000 mg | ORAL_CAPSULE | Freq: Two times a day (BID) | ORAL | Status: DC
  Administered 2012-01-21 – 2012-01-23 (×4): 100 mg via ORAL
  Filled 2012-01-21 (×6): qty 1

## 2012-01-21 MED ORDER — CITALOPRAM HYDROBROMIDE 20 MG PO TABS
20.0000 mg | ORAL_TABLET | Freq: Every day | ORAL | Status: DC
  Administered 2012-01-21 – 2012-01-23 (×3): 20 mg via ORAL
  Filled 2012-01-21 (×4): qty 1

## 2012-01-21 MED ORDER — SODIUM CHLORIDE 0.9 % IV SOLN: INTRAVENOUS | Status: DC

## 2012-01-21 MED ORDER — HEPARIN (PORCINE) IN NACL 100-0.45 UNIT/ML-% IJ SOLN
1300.0000 [IU]/h | INTRAMUSCULAR | Status: DC
  Filled 2012-01-21: qty 250

## 2012-01-21 MED ORDER — IPRATROPIUM-ALBUTEROL 18-103 MCG/ACT IN AERO
2.0000 | INHALATION_SPRAY | RESPIRATORY_TRACT | Status: DC | PRN
  Filled 2012-01-21: qty 14.7

## 2012-01-21 MED ORDER — SODIUM CHLORIDE 0.9 % IV SOLN: Freq: Once | INTRAVENOUS | Status: DC

## 2012-01-21 MED ORDER — ASPIRIN EC 81 MG PO TBEC
81.0000 mg | DELAYED_RELEASE_TABLET | Freq: Every day | ORAL | Status: DC
  Administered 2012-01-21 – 2012-01-23 (×3): 81 mg via ORAL
  Filled 2012-01-21 (×4): qty 1

## 2012-01-21 MED ORDER — LOSARTAN POTASSIUM 50 MG PO TABS
50.0000 mg | ORAL_TABLET | Freq: Every day | ORAL | Status: DC
  Administered 2012-01-21 – 2012-01-23 (×3): 50 mg via ORAL
  Filled 2012-01-21 (×4): qty 1

## 2012-01-21 MED ORDER — HEPARIN SODIUM (PORCINE) 5000 UNIT/ML IJ SOLN
5000.0000 [IU] | Freq: Three times a day (TID) | INTRAMUSCULAR | Status: DC
  Administered 2012-01-21 – 2012-01-23 (×5): 5000 [IU] via SUBCUTANEOUS
  Filled 2012-01-21 (×10): qty 1

## 2012-01-21 MED ORDER — CALCIUM CARBONATE-VITAMIN D 500-200 MG-UNIT PO TABS
1.0000 | ORAL_TABLET | Freq: Two times a day (BID) | ORAL | Status: DC
  Administered 2012-01-21 – 2012-01-23 (×4): 1 via ORAL
  Filled 2012-01-21 (×5): qty 1

## 2012-01-21 MED ORDER — IOHEXOL 350 MG/ML SOLN
100.0000 mL | Freq: Once | INTRAVENOUS | Status: AC | PRN
  Administered 2012-01-21: 100 mL via INTRAVENOUS

## 2012-01-21 MED ORDER — HEPARIN BOLUS VIA INFUSION: 4500.0000 [IU] | Freq: Once | INTRAVENOUS | Status: DC

## 2012-01-21 MED ORDER — TEMAZEPAM 15 MG PO CAPS
15.0000 mg | ORAL_CAPSULE | Freq: Every evening | ORAL | Status: DC | PRN
  Administered 2012-01-21 – 2012-01-23 (×2): 15 mg via ORAL
  Filled 2012-01-21 (×2): qty 1

## 2012-01-21 MED ORDER — FUROSEMIDE 10 MG/ML IJ SOLN
40.0000 mg | Freq: Two times a day (BID) | INTRAMUSCULAR | Status: DC
  Administered 2012-01-21 – 2012-01-23 (×4): 40 mg via INTRAVENOUS
  Filled 2012-01-21 (×5): qty 4

## 2012-01-21 MED ORDER — LEVOTHYROXINE SODIUM 88 MCG PO TABS
88.0000 ug | ORAL_TABLET | Freq: Every day | ORAL | Status: DC
  Administered 2012-01-21 – 2012-01-23 (×3): 88 ug via ORAL
  Filled 2012-01-21 (×4): qty 1

## 2012-01-21 MED ORDER — POTASSIUM CHLORIDE 20 MEQ/15ML (10%) PO LIQD
20.0000 meq | Freq: Two times a day (BID) | ORAL | Status: DC
  Administered 2012-01-21 (×2): 20 meq via ORAL
  Filled 2012-01-21 (×4): qty 15

## 2012-01-21 MED ORDER — PANTOPRAZOLE SODIUM 40 MG PO TBEC
40.0000 mg | DELAYED_RELEASE_TABLET | Freq: Every day | ORAL | Status: DC
  Administered 2012-01-22 – 2012-01-23 (×2): 40 mg via ORAL
  Filled 2012-01-21 (×2): qty 1

## 2012-01-21 MED ORDER — METOPROLOL TARTRATE 25 MG PO TABS
25.0000 mg | ORAL_TABLET | Freq: Two times a day (BID) | ORAL | Status: DC
  Administered 2012-01-21 – 2012-01-22 (×3): 25 mg via ORAL
  Filled 2012-01-21 (×4): qty 1

## 2012-01-21 MED ORDER — SIMVASTATIN 10 MG PO TABS
10.0000 mg | ORAL_TABLET | Freq: Every day | ORAL | Status: DC
  Administered 2012-01-21 – 2012-01-22 (×2): 10 mg via ORAL
  Filled 2012-01-21 (×4): qty 1

## 2012-01-21 MED ORDER — TIOTROPIUM BROMIDE MONOHYDRATE 18 MCG IN CAPS
18.0000 ug | ORAL_CAPSULE | Freq: Every day | RESPIRATORY_TRACT | Status: DC
  Administered 2012-01-21 – 2012-01-23 (×3): 18 ug via RESPIRATORY_TRACT
  Filled 2012-01-21 (×2): qty 5

## 2012-01-21 NOTE — Progress Notes (Signed)
Quick Note:  Pt aware of results via CY on phone discussing doppler results. ______

## 2012-01-21 NOTE — Assessment & Plan Note (Signed)
Noncompliant. She agrees to restart her CPAP with O2 2L bleed-in. Weight loss would help. I don't think her complaint of dyspnea is explained by this.

## 2012-01-21 NOTE — H&P (Signed)
Name: [name] MRN: [id number] DOB: 1941/05/22    LOS: 0    History of Present Illness:  71 year old former smoker followed for COPD and OSA with hx Lung CA/xrt/chemo, DVT, A Flutter and prior DVT.  Seen By Dr Maple Hudson in our office on 7/30 for 2 mo h/o progressive dyspnea and LE swelling. Additional complaints included dry non-productive cough, and progressive fatigue.  As part of the evaluation she had a CT angio of chest to r/o PE, and LE DVTs. Her CT was negative for PE, but her Lower extremities were concerning for possible DVT. Because of this the office called her for admission for evaluation and anticoagulation.  Repeat LE dopplers 7/31>>> 2 d echo 7/31>>> CT chest 7/31: neg for PE  Past Medical History  Diagnosis Date  . COPD (chronic obstructive pulmonary disease)   . H/O: lung cancer     reported in remission  . DVT (deep venous thrombosis)   . AAA (abdominal aortic aneurysm)   . OSA (obstructive sleep apnea)   . Hypothyroidism   . Hyperlipidemia   . History of uterine fibroid   . Hiatal hernia   . OA (osteoarthritis)   . History of fibrocystic disease of breast   . Small cell carcinoma of lung     NON-SMALL CELL CARCINOMA OF THE LUNG, METASTATIC TO THE SUPRACLAVICULAR AND MEDIASTINAL LYMPH NODES; in remission  . HTN (hypertension)   . Anxiety and depression   . PAD (peripheral artery disease)   . Ovarian mass     right benign  . Anticoagulant long-term use     Failed on Coumadin. On Xarelto  . Atrial fib/flutter, transient June 2012  . Normal nuclear stress test 2012    May 2012  . Esophageal dysmotility    Past Surgical History  Procedure Date  . Abdominal aortic aneurysm repair     stent graft  . Replacement total knee     left  . Thyroidectomy   . Breast biopsy     both breast's - benign  . Bladder surgery     sling  . Eye surgery     x2  . Cardiovascular stress test 2012    No ischemia   Prior to Admission medications   Medication  Sig Start Date End Date Taking? Authorizing Provider  albuterol-ipratropium (COMBIVENT) 18-103 MCG/ACT inhaler Inhale 2 puffs into the lungs every 4 (four) hours as needed. Shortness of breath   Yes Historical Provider, MD  aspirin 81 MG EC tablet Take 81 mg by mouth daily.    Yes Historical Provider, MD  Calcium Carbonate-Vitamin D (CALCIUM + D PO) Take 2 tablets by mouth daily.     Yes Historical Provider, MD  Cholecalciferol (VITAMIN D) 1000 UNITS capsule Take 1,000 Units by mouth daily.     Yes Historical Provider, MD  citalopram (CELEXA) 20 MG tablet Take 20 mg by mouth daily.    Yes Historical Provider, MD  diltiazem (CARDIZEM CD) 120 MG 24 hr capsule Take 120 mg by mouth daily. 11/19/11  Yes Peter M [place], MD  fish oil-omega-3 fatty acids 1000 MG capsule Take 1 g by mouth daily.    Yes Historical Provider, MD  furosemide (LASIX) 40 MG tablet Take 40 mg by mouth daily.     Yes Historical Provider, MD  levothyroxine (SYNTHROID, LEVOTHROID) 88 MCG tablet Take 88 mcg by mouth daily.   Yes Historical Provider, MD  losartan (COZAAR) 50 MG tablet Take  1 tablet (50 mg total) by mouth daily. 05/21/11 05/20/12 Yes Peter M [place], MD  metoprolol tartrate (LOPRESSOR) 25 MG tablet Take 1 tablet (25 mg total) by mouth 2 (two) times daily. 11/19/11  Yes Peter M [place], MD  morphine (MSIR) 15 MG tablet Take 15 mg by mouth every 4 (four) hours as needed. pain   Yes Historical Provider, MD  pravastatin (PRAVACHOL) 20 MG tablet Take 20 mg by mouth daily.     Yes Historical Provider, MD  temazepam (RESTORIL) 15 MG capsule Take 15 mg by mouth at bedtime as needed. sleep 01/12/12 07/10/12 Yes Waymon Budge, MD  tiotropium (SPIRIVA HANDIHALER) 18 MCG inhalation capsule Place 1 capsule (18 mcg total) into inhaler and inhale daily. 06/06/11 06/05/12 Yes Waymon Budge, MD   Allergies Allergies  Allergen Reactions  . Amoxicillin Other (See Comments)    Funny feeling    Family History Family History  Problem  Relation Age of Onset  . Heart disease Father   . Heart attack Father   . Diabetes type II Mother   . Heart disease Brother   . Heart disease Sister     See's Dr. Elease Hashimoto    Social History  reports that she quit smoking about 10 years ago. Her smoking use included Cigarettes. She has a 40 pack-year smoking history. She has never used smokeless tobacco. She reports that she does not drink alcohol or use illicit drugs.  Review Of Systems    Constitutional: No- weight loss, night sweats, fevers, chills, lassitude.  HEENT: No- headaches, difficulty swallowing, tooth/dental problems, sore throat,  No- sneezing, itching, ear ache, +nasal congestion, post nasal drip,  CV: No- chest pain, orthopnea, PND, +swelling in lower extremities, No-anasarca, dizziness, +Occasional palpitations  Resp: +shortness of breath with exertion or at rest.   +non-productive cough , + coughing up of blood X 1 No- change in color of mucus. No- wheezing.  Skin: No- rash or lesions.  GI: No- heartburn, indigestion, abdominal pain, nausea, vomiting,  GU: N  MS: No- joint pain or swelling.  Neuro- nothing unusual  Psych: No- change in mood or affect. No depression or anxiety. No memory loss.  Vital Signs: BP 128/55  Pulse 60  Temp 97.7 F (36.5 C) (Oral)        No intake or output data in the 24 hours ending 01/21/12 1424  Physical Examination: General:  Obese 71 year old female.  Neuro:  Oriented X 3 HEENT:  Neck large, can't assess JVD Cardiovascular:  rrr Lungs:  Decreased t/o Abdomen:  Obese, + bowel sounds  Musculoskeletal:  Intact  Skin: intact. Lower extremity edema  Ventilator settings:    Labs and Imaging:  No results found for this basename: NA:3,K:3,CL:3,CO2:3,BUN:3,CREATININE:3,GLUCOSE:3 in the last 168 hours  Lab 01/20/12 1258  HGB 14.6  HCT 43.0  WBC 6.5  PLT 171.0    Assessment and Plan: Active Problems:  SLEEP APNEA, OBSTRUCTIVE  DVT  COPD  DYSPNEA ON EXERTION   Edema  Hypothyroid  HTN (hypertension)   Dyspnea on exertion due to decompensated OHS/OSA, secondary PAH w/ cor pulmonale  and volume overload. Her CT angio is negative for PE. Her LE dopplers were initially read as positive, but call back interpretation was inconclusive per verbal report.  Plan: -admit -check ambulatory desats -repeat LE dopplers -check D Dimer, BNP, cycle enzymes, check TSH -will go ahead and initiate heparin gtt for DVT, she has h/o cancer and prior DVT. Also appropriate in setting of  PHTN. Will not proceed w/ further coagulation studies as if DVT is positive she will now need life long heparin.  -diurese as BUN and creatinine will allow -echo to eval Right and Left heart.  -nocturnal CPAP (auto-set w/ 2 liters)  COPD: not having active exacerbation Plan Cont home rx  Possible DVT Plan: As above, heparin, repeat Doppler  HTN Plan: -cont home RX  Best practices / Disposition: -->med ward -->full code -->Heparin gtt -->Protonix for GI Px -->diet: CHO mod     BABCOCK,PETE 01/21/2012, 2:24 PM  Will admit to tele, ambulatory desat, repeat lower ext dopplers, cardiac enzymes, nocturnal CPAP, diureses and echo.  Will f/u in AM.  Start heparin.  Will f/u on labs in AM.  Patient seen and examined, agree with above note.  I dictated the care and orders written for this patient under my direction.  Koren Bound, M.D. [phone]

## 2012-01-21 NOTE — Consult Note (Signed)
ANTICOAGULATION CONSULT NOTE - Initial Consult  Pharmacy Consult for Heparin Indication: r/o DVT  Allergies  Allergen Reactions  . Amoxicillin Other (See Comments)    Funny feeling   Patient Measurements: Height: 5' 1.81" (157 cm) Weight: 238 lb 1.6 oz (108 kg) IBW/kg (Calculated) : 49.67  Heparin dosing weight: 75.8kg  Vital Signs: Temp: 97.7 F (36.5 C) (07/31 1314) Temp src: Oral (07/31 1314) BP: 128/55 mmHg (07/31 1314) Pulse Rate: 60  (07/31 1314)  Labs:  Basename 01/21/12 1434 01/20/12 1258  HGB 14.1 14.6  HCT 42.2 43.0  PLT 141* 171.0  APTT -- --  LABPROT -- --  INR -- --  HEPARINUNFRC -- --  CREATININE -- --  CKTOTAL -- --  CKMB -- --  TROPONINI -- --    Estimated Creatinine Clearance: 72.2 ml/min (by C-G formula based on Cr of 0.63).   Medical History: Past Medical History  Diagnosis Date  . COPD (chronic obstructive pulmonary disease)   . H/O: lung cancer     reported in remission  . DVT (deep venous thrombosis)   . AAA (abdominal aortic aneurysm)   . OSA (obstructive sleep apnea)   . Hypothyroidism   . Hyperlipidemia   . History of uterine fibroid   . Hiatal hernia   . OA (osteoarthritis)   . History of fibrocystic disease of breast   . Small cell carcinoma of lung     NON-SMALL CELL CARCINOMA OF THE LUNG, METASTATIC TO THE SUPRACLAVICULAR AND MEDIASTINAL LYMPH NODES; in remission  . HTN (hypertension)   . Anxiety and depression   . PAD (peripheral artery disease)   . Ovarian mass     right benign  . Anticoagulant long-term use     Failed on Coumadin. On Xarelto  . Atrial fib/flutter, transient June 2012  . Normal nuclear stress test 2012    May 2012  . Esophageal dysmotility    Medications:  No anticoagulants pta  Assessment: 71yof with hx lung CA and DVT presents to ED with leg pain. Initial LE dopplers concerning for another DVT, CT chest negative for PE. She will begin heparin while awaiting repeat LE dopplers to rule out DVT.  CBC wnl, no BMET yet but sCr from 12/12/11 was wnl.  Goal of Therapy:  Heparin level 0.3-0.7 units/ml Monitor platelets by anticoagulation protocol: Yes   Plan:  1) Heparin bolus 4500 units x 1 2) Heparin drip at 1300 units/hr 3) 8h heparin level 4) Daily heparin level and CBC  Bonnie, Ball 01/21/2012,2:58 PM

## 2012-01-21 NOTE — ED Provider Notes (Signed)
History    This chart was scribed for Bonnie Baker, MD, MD by Smitty Pluck. The patient was seen in room TR09C and the patient's care was started at 2:22PM.   CSN: [id number]  Arrival date & time 01/21/12  1253   None     Chief Complaint  Patient presents with  . Leg Pain    (Consider location/radiation/quality/duration/timing/severity/associated sxs/prior treatment) Patient is a 71 y.o. female presenting with leg pain. The history is provided by the patient.  Leg Pain    JOLANE BANKHEAD is a 70 y.o. female who presents to the Emergency Department complaining of DVT in bilateral legs. Pt was told she had DVT at [facility] and had doppler. Dr. Maple Hudson told pt to come to ED. Pt reports having SOB. Denies chest pain. Pt has hx of blood clots and is currently taken asa. Denies cough and fever. SOB is aggravated by walking and exertion.   PCP is Dr. Jacky Kindle    Past Medical History  Diagnosis Date  . COPD (chronic obstructive pulmonary disease)   . H/O: lung cancer     reported in remission  . DVT (deep venous thrombosis)   . AAA (abdominal aortic aneurysm)   . OSA (obstructive sleep apnea)   . Hypothyroidism   . Hyperlipidemia   . History of uterine fibroid   . Hiatal hernia   . OA (osteoarthritis)   . History of fibrocystic disease of breast   . Small cell carcinoma of lung     NON-SMALL CELL CARCINOMA OF THE LUNG, METASTATIC TO THE SUPRACLAVICULAR AND MEDIASTINAL LYMPH NODES; in remission  . HTN (hypertension)   . Anxiety and depression   . PAD (peripheral artery disease)   . Ovarian mass     right benign  . Anticoagulant long-term use     Failed on Coumadin. On Xarelto  . Atrial fib/flutter, transient June 2012  . Normal nuclear stress test 2012    May 2012  . Esophageal dysmotility     Past Surgical History  Procedure Date  . Abdominal aortic aneurysm repair     stent graft  . Replacement total knee     left  . Thyroidectomy   . Breast biopsy     both breast's  - benign  . Bladder surgery     sling  . Eye surgery     x2  . Cardiovascular stress test 2012    No ischemia    Family History  Problem Relation Age of Onset  . Heart disease Father   . Heart attack Father   . Diabetes type II Mother   . Heart disease Brother   . Heart disease Sister     See's Dr. Elease Hashimoto    History  Substance Use Topics  . Smoking status: Former Smoker -- 1.0 packs/day for 40 years    Types: Cigarettes    Quit date: 06/23/2001  . Smokeless tobacco: Never Used  . Alcohol Use: No    OB History    Grav Para Term Preterm Abortions TAB SAB Ect Mult Living                  Review of Systems  Constitutional: Negative for fever and chills.  Respiratory: Positive for shortness of breath. Negative for cough.   Gastrointestinal: Negative for nausea and vomiting.  Neurological: Negative for weakness.  All other systems reviewed and are negative.    Allergies  Amoxicillin  Home Medications   Current Outpatient Rx  Name Route Sig Dispense Refill  . IPRATROPIUM-ALBUTEROL 18-103 MCG/ACT IN AERO Inhalation Inhale 2 puffs into the lungs every 4 (four) hours as needed. Shortness of breath    . ASPIRIN 81 MG PO TBEC Oral Take 81 mg by mouth daily.     Marland Kitchen CALCIUM + D PO Oral Take 2 tablets by mouth daily.      Marland Kitchen VITAMIN D 1000 UNITS PO CAPS Oral Take 1,000 Units by mouth daily.      Marland Kitchen CITALOPRAM HYDROBROMIDE 20 MG PO TABS Oral Take 20 mg by mouth daily.     Marland Kitchen DILTIAZEM HCL ER COATED BEADS 120 MG PO CP24 Oral Take 120 mg by mouth daily.    . OMEGA-3 FATTY ACIDS 1000 MG PO CAPS Oral Take 1 g by mouth daily.     . FUROSEMIDE 40 MG PO TABS Oral Take 40 mg by mouth daily.      Marland Kitchen LEVOTHYROXINE SODIUM 88 MCG PO TABS Oral Take 88 mcg by mouth daily.    Marland Kitchen LOSARTAN POTASSIUM 50 MG PO TABS Oral Take 1 tablet (50 mg total) by mouth daily. 90 tablet 3  . METOPROLOL TARTRATE 25 MG PO TABS Oral Take 1 tablet (25 mg total) by mouth 2 (two) times daily. 180 tablet 3  .  MORPHINE SULFATE 15 MG PO TABS Oral Take 15 mg by mouth every 4 (four) hours as needed. pain    . PRAVASTATIN SODIUM 20 MG PO TABS Oral Take 20 mg by mouth daily.      Marland Kitchen TEMAZEPAM 15 MG PO CAPS Oral Take 15 mg by mouth at bedtime as needed. sleep    . TIOTROPIUM BROMIDE MONOHYDRATE 18 MCG IN CAPS Inhalation Place 1 capsule (18 mcg total) into inhaler and inhale daily. 30 capsule 3    BP 128/55  Pulse 60  Temp 97.7 F (36.5 C) (Oral)  Physical Exam  Nursing note and vitals reviewed. Constitutional: She is oriented to person, place, and time. She appears well-developed and well-nourished.  HENT:  Head: Normocephalic and atraumatic.  Eyes: Conjunctivae are normal.  Neck: Normal range of motion. Neck supple.  Pulmonary/Chest: Effort normal. No respiratory distress.  Musculoskeletal:       3+ pitting edema bilateral lower extremities  neurovascular intact  Neurological: She is alert and oriented to person, place, and time.  Skin: Skin is warm and dry.  Psychiatric: She has a normal mood and affect. Her behavior is normal.    ED Course  Procedures (including critical care time) DIAGNOSTIC STUDIES:   COORDINATION OF CARE:    Labs Reviewed - No data to display Dg Chest 2 View  01/20/2012  *RADIOLOGY REPORT*  Clinical Data: Shortness of breath.  Cough.  Dyspnea.  Ex-smoker with COPD.  CHEST - 2 VIEW  Comparison: 12/11/2010  Findings: Hyperinflation, with increased diameter of the chest on the lateral view.  Mild accentuation of expected thoracic kyphosis. Radiographic artifact projecting over the left side of the chest on the frontal view.  Vascular stent graft projecting over the upper abdomen.  Midline trachea.  Mild cardiomegaly with a tortuous thoracic aorta. No pleural effusion or pneumothorax.  No congestive failure.  Mild lower lobe predominant interstitial thickening.  Mild bibasilar volume loss, without lobar consolidation  IMPRESSION:  1. No acute cardiopulmonary disease. 2.   COPD/chronic bronchitis. 3. Cardiomegaly without congestive failure.  Original Report Authenticated By: Consuello Bossier, M.D.   Ct Angio Chest W/cm &/or Wo Cm  01/21/2012  *RADIOLOGY REPORT*  Clinical Data:  Elevated D-dimer.  Evaluate for pulmonary embolism.  CT ANGIOGRAPHY CHEST WITH CONTRAST  Technique:  Multidetector CT imaging of the chest was performed using the standard protocol during bolus administration of intravenous contrast.  Multiplanar CT image reconstructions including MIPs were obtained to evaluate the vascular anatomy.  Contrast: OMNIPAQUE IOHEXOL 350 MG/ML SOLN  Comparison:   None.  Findings: There is no evidence for pulmonary embolism.  There is a small amount of right pleural fluid and pleural thickening along the left posterior hemithorax.  The upper abdominal structures are within normal limits.  No evidence for chest lymphadenopathy but there is right hilar soft tissue fullness.  Again noted is a nodule in the right thyroid region that measures 2.9 x 2.6 cm.  There is a left low density nodule in this area that measures up to 1.8 cm and similar to the previous examination.  Again noted is a round low density structure in the left breast tissue which is stable and could represent a cyst.  There is atherosclerotic disease in the upper abdominal aorta.  Again noted is a saccular aneurysm of the descending thoracic aorta just above the aortic hiatus. The aneurysm extends anterior to the aorta and the aneurysm itself measures roughly 3.4 x 2.0 cm.  AP dimension of the aorta at the level aneurysm measures 4.1 cm. The appearance and size of the aneurysm have not significantly changed.  The trachea and mainstem bronchi are patent.  The patient has centrilobular emphysematous changes.  Atelectasis and scarring along the anterior right upper lobe.  There are patchy interstitial and ground-glass densities throughout both lungs.  No focal areas of consolidation.  No acute bony abnormality.   IMPRESSION:  No evidence for pulmonary embolism.  Small right pleural effusion.  Diffuse emphysematous disease and subtle parenchymal densities in the lungs.  Findings could represent atelectasis or mild edema. Few areas of volume loss but no focal consolidation.  Stable saccular aneurysm involving descending thoracic aorta.  Stable nodules in the lower neck may be related to thyroid nodules.  Original Report Authenticated By: Richarda Overlie, M.D.     No diagnosis found.    MDM  Pt has known dvts dx today pta, [facility] here to admit pt      I personally performed the services described in this documentation, which was scribed in my presence. The recorded information has been reviewed and considered.     Bonnie Baker, MD 01/21/12 [phone]

## 2012-01-21 NOTE — Assessment & Plan Note (Signed)
Since heart failure is ruled out by cards (her report), and she is not overtly wheezing, my first concern would be PE. Obesity and peripheral venous insufficiency may be the primary problems. Anemia less likely. Known PHTN may need treatment.  I don't think she is describing bronchospasm. Plan- CXR, D-dimer, CBC

## 2012-01-21 NOTE — Progress Notes (Signed)
Quick Note:  Pt is aware of results and aware that labs to be done today and PCC's to call STAT about CT angio and Leg doppler done today. ______

## 2012-01-21 NOTE — Telephone Encounter (Signed)
Error.[name] ° °

## 2012-01-21 NOTE — ED Notes (Signed)
Bilateral calf pain. Mild swelling; however, rt. Leg mildly bigger.

## 2012-01-21 NOTE — Progress Notes (Signed)
Bilateral lower extremity venous duplex completed.  Preliminary report is negative for DVT, SVT, or a Baker's cyst. 

## 2012-01-21 NOTE — Significant Event (Signed)
Negative for DVT Will stop IV heparin for now.   Anders Simmonds ACNP-BC [facility] Pulmonary/Critical Care Pager # [phone] OR # [phone] if no answer

## 2012-01-21 NOTE — Progress Notes (Signed)
Quick Note:  Pt aware of results. ______ 

## 2012-01-22 DIAGNOSIS — I059 Rheumatic mitral valve disease, unspecified: Secondary | ICD-10-CM

## 2012-01-22 LAB — CBC
HCT: 41.3 % (ref 36.0–46.0)
Hemoglobin: 14.2 g/dL (ref 12.0–15.0)
MCH: 34.9 pg — ABNORMAL HIGH (ref 26.0–34.0)
MCHC: 34.4 g/dL (ref 30.0–36.0)
MCV: 101.5 fL — ABNORMAL HIGH (ref 78.0–100.0)
Platelets: 141 10*3/uL — ABNORMAL LOW (ref 150–400)
RBC: 4.07 MIL/uL (ref 3.87–5.11)
RDW: 13.5 % (ref 11.5–15.5)
WBC: 5 10*3/uL (ref 4.0–10.5)

## 2012-01-22 LAB — BASIC METABOLIC PANEL
BUN: 17 mg/dL (ref 6–23)
CO2: 30 mEq/L (ref 19–32)
Calcium: 9.7 mg/dL (ref 8.4–10.5)
Chloride: 102 mEq/L (ref 96–112)
Creatinine, Ser: 0.86 mg/dL (ref 0.50–1.10)
GFR calc Af Amer: 76 mL/min — ABNORMAL LOW (ref >90)
GFR calc non Af Amer: 65 mL/min — ABNORMAL LOW (ref >90)
Glucose, Bld: 128 mg/dL — ABNORMAL HIGH (ref 70–99)
Potassium: 4.1 mEq/L (ref 3.5–5.1)
Sodium: 140 mEq/L (ref 135–145)

## 2012-01-22 LAB — CARDIAC PANEL(CRET KIN+CKTOT+MB+TROPI)
CK, MB: 3 ng/mL (ref 0.3–4.0)
CK, MB: 2.6 ng/mL (ref 0.3–4.0)
Relative Index: INVALID (ref 0.0–2.5)
Relative Index: INVALID (ref 0.0–2.5)
Total CK: 85 U/L (ref 7–177)
Total CK: 87 U/L (ref 7–177)
Troponin I: <0.3 ng/mL (ref <0.30)
Troponin I: <0.3 ng/mL (ref <0.30)

## 2012-01-22 LAB — PROTIME-INR
INR: 1 (ref 0.00–1.49)
Prothrombin Time: 13.4 seconds (ref 11.6–15.2)

## 2012-01-22 LAB — TSH: TSH: 2.446 u[IU]/mL (ref 0.350–4.500)

## 2012-01-22 LAB — APTT: aPTT: 39 seconds — ABNORMAL HIGH (ref 24–37)

## 2012-01-22 MED ORDER — POTASSIUM CHLORIDE CRYS ER 20 MEQ PO TBCR
20.0000 meq | EXTENDED_RELEASE_TABLET | Freq: Two times a day (BID) | ORAL | Status: DC
  Administered 2012-01-22 – 2012-01-23 (×3): 20 meq via ORAL
  Filled 2012-01-22 (×4): qty 1

## 2012-01-22 NOTE — Progress Notes (Signed)
Utilization review completed.  

## 2012-01-22 NOTE — Progress Notes (Signed)
Nutrition Brief Note  Patient identified on the Nutrition Risk Report for problems chewing or swallowing foods and/or liquids.   Body mass index is 42.52 kg/(m^2). Pt meets criteria for Obesity Class III based on current BMI.   Current diet order is Carbohydrate Modified Medium Calorie; no % meal intake recorded; reports a good appetite. Labs and medications reviewed.   No nutrition interventions warranted at this time. If nutrition issues arise, please consult RD.   Kirkland Hun, RD, LDN Pager #: [phone] After-Hours Pager #: [phone]

## 2012-01-22 NOTE — Progress Notes (Signed)
eLink Physician-Brief Progress Note Patient Name: [name] DOB: 12-Aug-1940 MRN: [id number]  Date of Service  01/22/2012   HPI/Events of Note  Notified that pt's BP 90-110's today after addition metoprolol 25 today.   eICU Interventions  Will hold the pm dose, let rounding team decide in am whether to restart at a lower dose.    Intervention Category Intermediate Interventions: Hypotension - evaluation and management  Saga Balthazar S. 01/22/2012, 9:51 PM

## 2012-01-22 NOTE — Progress Notes (Signed)
Name: [name] MRN: [id number] DOB: 12-31-1940    LOS: 1    History of Present Illness:  71 year old former smoker followed for COPD and OSA with hx Lung CA/xrt/chemo, DVT, A Flutter and prior DVT.  Seen By Dr Maple Hudson in our office on 7/30 for 2 mo h/o progressive dyspnea and LE swelling. Additional complaints included dry non-productive cough, and progressive fatigue.  As part of the evaluation she had a CT angio of chest to r/o PE, and LE DVTs. Her CT was negative for PE, but her Lower extremities were concerning for possible DVT. Because of this the office called her for admission for evaluation and anticoagulation.  Repeat LE dopplers 7/31>>> 2 d echo 7/31>>> CT chest 7/31: neg for PE  Vital Signs: BP 100/52  Pulse 58  Temp 98.5 F (36.9 C) (Oral)  Resp 18  Ht 5\' 2"  (1.575 m)  Wt 105.461 kg (232 lb 8 oz)  BMI 42.52 kg/m2  SpO2 92%       . sodium chloride    . DISCONTD: heparin       Intake/Output Summary (Last 24 hours) at 01/22/12 1328 Last data filed at 01/22/12 1300  Gross per 24 hour  Intake      4 ml  Output   1351 ml  Net  -1347 ml    Physical Examination: General:  Obese 71 year old female.  Neuro:  Oriented X 3 HEENT:  Neck large, can't assess JVD Cardiovascular:  rrr Lungs:  Decreased t/o Abdomen:  Obese, + bowel sounds  Musculoskeletal:  Intact  Skin: intact. Lower extremity edema  Ventilator settings:    Labs and Imaging:   Lab 01/22/12 0150 01/21/12 1623 01/21/12 1434  NA 140 140 139  K 4.1 4.2 4.0  CL 102 103 102  CO2 30 25 27   BUN 17 12 13   CREATININE 0.86 0.80 0.80  GLUCOSE 128* 80 88    Lab 01/22/12 0150 01/21/12 1623 01/21/12 1434  HGB 14.2 14.2 14.1  HCT 41.3 42.9 42.2  WBC 5.0 5.9 5.9  PLT 141* 144* 141*    Assessment and Plan: Active Problems:  SLEEP APNEA, OBSTRUCTIVE  DVT  COPD  DYSPNEA ON EXERTION  Edema  Hypothyroid  HTN (hypertension)   Dyspnea on exertion due to decompensated OHS/OSA,  secondary PAH w/ cor pulmonale  and volume overload. Her CT angio is negative for PE. Her LE dopplers were initially read as positive, but call back interpretation was inconclusive per verbal report.  Plan: -admit -check ambulatory desats -repeat LE dopplers -check D Dimer, BNP, cycle enzymes, check TSH -will go ahead and initiate heparin gtt for DVT, she has h/o cancer and prior DVT. Also appropriate in setting of PHTN. Will not proceed w/ further coagulation studies as if DVT is positive she will now need life long heparin.  -diurese as BUN and creatinine will allow -echo to eval Right and Left heart.  -nocturnal CPAP (auto-set w/ 2 liters)  COPD: not having active exacerbation Plan -Cont home rx  Possible DVT Plan: As above, heparin, repeat Doppler which is pending for now but no PE noted.  HTN Plan: -Cont home RX  Will await final result from vascular lab, if negative then will d/c heparin and likely d/c patient home.  Echo results also pending to evaluate if the bilateral leg edema are related to CHF.  Patient seen and examined, agree with above note.  I dictated the care and orders  written for this patient under my direction.  Koren Bound, M.D. [phone]

## 2012-01-22 NOTE — Progress Notes (Signed)
  Echocardiogram 2D Echocardiogram has been performed.  [name] 01/22/2012, 10:32 AM

## 2012-01-23 ENCOUNTER — Telehealth: Payer: Self-pay | Admitting: Internal Medicine

## 2012-01-23 DIAGNOSIS — I4892 Unspecified atrial flutter: Secondary | ICD-10-CM

## 2012-01-23 DIAGNOSIS — I498 Other specified cardiac arrhythmias: Secondary | ICD-10-CM

## 2012-01-23 DIAGNOSIS — R001 Bradycardia, unspecified: Secondary | ICD-10-CM

## 2012-01-23 DIAGNOSIS — J449 Chronic obstructive pulmonary disease, unspecified: Secondary | ICD-10-CM

## 2012-01-23 LAB — CBC
HCT: 41.6 % (ref 36.0–46.0)
Hemoglobin: 14.3 g/dL (ref 12.0–15.0)
MCH: 35 pg — ABNORMAL HIGH (ref 26.0–34.0)
MCHC: 34.4 g/dL (ref 30.0–36.0)
MCV: 101.7 fL — ABNORMAL HIGH (ref 78.0–100.0)
Platelets: 143 10*3/uL — ABNORMAL LOW (ref 150–400)
RBC: 4.09 MIL/uL (ref 3.87–5.11)
RDW: 13.6 % (ref 11.5–15.5)
WBC: 5.9 10*3/uL (ref 4.0–10.5)

## 2012-01-23 MED ORDER — POTASSIUM CHLORIDE CRYS ER 20 MEQ PO TBCR: 20.0000 meq | EXTENDED_RELEASE_TABLET | Freq: Two times a day (BID) | ORAL | Status: DC

## 2012-01-23 MED ORDER — FUROSEMIDE 40 MG PO TABS: 40.0000 mg | ORAL_TABLET | Freq: Two times a day (BID) | ORAL | Status: DC

## 2012-01-23 NOTE — Care Management Note (Signed)
    Page 1 of 2   01/23/2012     3:39:57 PM   CARE MANAGEMENT NOTE 01/23/2012  Patient:  [name], [name]   Account Number:  [id number]  Date Initiated:  01/23/2012  Documentation initiated by:  Donn Pierini  Subjective/Objective Assessment:   Pt admitted with SOB, DVT     Action/Plan:   PTA pt lived at home alone, uses walker at times. Has O2 with [facility]   Anticipated DC Date:  01/23/2012   Anticipated DC Plan:  HOME/SELF CARE      DC Planning Services  CM consult      Choice offered to / List presented to:     DME arranged  [facility]      DME agency  [facility]        Status of service:  Completed, signed off Medicare Important Message given?   (If response is "NO", the following Medicare IM given date fields will be blank) Date Medicare IM given:   Date Additional Medicare IM given:    Discharge Disposition:  HOME/SELF CARE  Per UR Regulation:  Reviewed for med. necessity/level of care/duration of stay  If discussed at Long Length of Stay Meetings, dates discussed:    Comments:  PCP- Jacky Kindle  01/23/12- 1530- Donn Pierini RN, BSN [phone] Pt with order for home O2, spoke with pt at bedside- per conversation pt already has home O2 with [facility] that she wears at night, she also has Ball CPAP at home that [facility] is to come out and bring new mask for. Per pt she wants lighter weight portable O2 tanks at home. Call placed to [facility] with [facility] regarding order for Home O2 - now continous. Pt O2 sats 93% on RA and pt wants to d/c home- declines to wait on portable tank to go home with- states she will place O2 on when she gets home- reinforced with pt that MD orders are now for continuous O2 and not just at night- pt verbalized understanding.

## 2012-01-23 NOTE — Telephone Encounter (Signed)
Order placed as requested.

## 2012-01-23 NOTE — Progress Notes (Signed)
Name: RASHAWNA SCOLES MRN: [id number] DOB: [birth year]    LOS: 2    History of Present Illness:  71 year old former smoker followed for COPD and OSA with hx Lung CA/xrt/chemo, DVT, A Flutter and prior DVT.  Seen By Dr Maple Hudson in our office on 7/30 for 2 mo h/o progressive dyspnea and LE swelling. Additional complaints included dry non-productive cough, and progressive fatigue.  As part of the evaluation she had a CT angio of chest to r/o PE, and LE DVTs. Her CT was negative for PE, but her Lower extremities were concerning for possible DVT. Because of this the office called her for admission for evaluation and anticoagulation.  Repeat LE dopplers 7/31>>>prelim negative  2 d echo 7/31>>>The cavity size was normal. Wall thickness was normal. Systolic function was normal. The estimated ejection fraction was in the range of 55% to 60%. Features are consistent with a pseudonormal left ventricular filling pattern, with concomitant abnormal relaxation and increased filling pressure (grade 2 diastolic dysfunction). CT chest 7/31: neg for PE  Subjective   Vital Signs: BP 108/66  Pulse 60  Temp 98 F (36.7 C) (Oral)  Resp 18  Ht 5\' 2"  (1.575 m)  Wt 107.366 kg (236 lb 11.2 oz)  BMI 43.29 kg/m2  SpO2 94%       . sodium chloride       Intake/Output Summary (Last 24 hours) at 01/23/12 0930 Last data filed at 01/23/12 4010  Gross per 24 hour  Intake      0 ml  Output   1350 ml  Net  -1350 ml    Physical Examination: General:  Obese 71 year old female.  Neuro:  Oriented X 3 HEENT:  Neck large, can't assess JVD Cardiovascular:  rrr Lungs:  Decreased t/o Abdomen:  Obese, + bowel sounds  Musculoskeletal:  Intact  Skin: intact. Lower extremity edema  Ventilator settings:    Labs and Imaging:   Lab 01/22/12 0150 01/21/12 1623 01/21/12 1434  NA 140 140 139  K 4.1 4.2 4.0  CL 102 103 102  CO2 30 25 27   BUN 17 12 13   CREATININE 0.86 0.80 0.80  GLUCOSE 128* 80 88    Lab  01/23/12 0525 01/22/12 0150 01/21/12 1623  HGB 14.3 14.2 14.2  HCT 41.6 41.3 42.9  WBC 5.9 5.0 5.9  PLT 143* 141* 144*    Assessment and Plan: Active Problems:  SLEEP APNEA, OBSTRUCTIVE  DVT  COPD  DYSPNEA ON EXERTION  Edema  Hypothyroid  HTN (hypertension)   Dyspnea on exertion due to decompensated OHS/OSA, secondary PAH w/ cor pulmonale  and volume overload. Her CT angio is negative for PE. Her LE dopplers are negative. Think that most of her dyspnea was due to decompensated OHS/OSA, volume overload and diastolic heart failure. Likely can contribute a large part of this to non-compliance with her CPAP. She is better w/ diuresis. Her oxygen level dropped to 84% during ambulation in the hall. We completed about 57ft and she had significant dyspnea. Of note her HR never reached above 60 during this exertion.  Plan: -discharge to home on diuretic regimen -have encouraged CPAP, mandatory at night.  -oxygen 24/7 -will follow up in our clinic next week for lab work and post-hospital visit.   COPD: not having active exacerbation Plan -Cont home rx  Bradycardia/ symptomatic . During ambulation her HR stayed in the 60s, and dropped down to 50s. Hypoxia could certainly contribute to this, however,  also think we need to consider changing her regimen for her AFib. She is currently in [facility] [place] [facility]. Her regimen includes a b-blocker and CCB. Think we probably need to cut these rate controlling meds back, but reluctant to do this w/out cardiology in-put as they have followed her in the outpt setting.  Plan: Consult [place] cards.. ? D/C metoprolol, ? Continue her CCB?  We may need to keep her another 24 hours on tele to assess these changes.   Lower extremity edema: negative for DVT.  Plan -diuresis   HTN Plan: -Cont home RX  Dispo: She is almost ready for d/c but think that her bradycardia may be a significant contributing factor to her exertional dyspnea. Given her degree of exertion her  HR should be much higher than 60s w/ exertion. Will await cardiology input before we discharge her.  Luvinia Lucy,PETE,

## 2012-01-23 NOTE — Clinical Documentation Improvement (Signed)
GENERIC DOCUMENTATION CLARIFICATION QUERY  THIS DOCUMENT IS NOT A PERMANENT PART OF THE MEDICAL RECORD  TO RESPOND TO THE THIS QUERY, FOLLOW THE INSTRUCTIONS BELOW:  1. If needed, update documentation for the patient's encounter via the notes activity.  2. Access this query again and click edit on the In [employer].  3. After updating, or not, click F2 to complete all highlighted (required) fields concerning your review. Select "additional documentation in the medical record" OR "no additional documentation provided".  4. Click Sign note button.  5. The deficiency will fall out of your In Basket *Please let us know if you are not able to complete this workflow by phone or e-mail (listed below).  Please update your documentation within the medical record to reflect your response to this query.                                                                                        01/23/12   Dear Dr. Molli Knock  / Associates,  In a better effort to capture your patient's severity of illness, reflect appropriate length of stay and utilization of resources, a review of the patient medical record has revealed the following indicators.    Possible Clinical Conditions? - DYSPNEA ON EXERTION IS A SYMPTON AND WILL BE CODED AS SUCH.  PLEASE CLARIFY POSSIBLE, PROBABLE, LIKELY, SUSPECTED UNDERLYING CAUSE OF PT'S DOE.  Supporting Information: - Risk Factors:  BMI 42.52 per RD, Pulmonary HTN w/cor pulmonale volume overload POA, COPD, OHS/OSA - Signs & Symptoms: LE pitting edema, CXR shows pleural effusions, sats drop to 84% with ambulation, symptomatic bradycardia, EF 50-60% - Treatment: Lasix IV 40mg  q12h, Cozaar daily, Spireva nebs, ECHO   You may use possible, probable, or suspect with inpatient documentation. possible, probable, suspected diagnoses MUST be documented at the time of discharge  Reviewed: additional documentation in the medical record  Thank You,  Beverley Fiedler RN Clinical  Documentation Specialist: Pager: [phone] Health Information Management: [phone] [facility]

## 2012-01-23 NOTE — Telephone Encounter (Signed)
Per Florentina Addison send to Dr. Maple Hudson to get his approval. Please advise thanks

## 2012-01-23 NOTE — Progress Notes (Signed)
Discharge review and heart failure teaching done with patient.  Heart failure information explained and provided to patient. Patient discharged home with oxygen and explained the importance of not smoking near oxygen.   Patient acknowledged understanding of information provided.  Patient is stable and discharged home with family member. [name]

## 2012-01-23 NOTE — Progress Notes (Signed)
Patient [name]. [name], 71 year old white female requested an Advanced Directive packet.  Patient expressed appreciation for Chaplain's provision of documents and pastoral presence.  For follow-up, I will refer patient to the unit Chaplain.

## 2012-01-23 NOTE — Progress Notes (Signed)
Patient's oxygen saturation 88% RA.

## 2012-01-23 NOTE — Discharge Summary (Addendum)
Physician Discharge Summary     Patient ID: [name] MRN: [id number] DOB/AGE: [birth year] 73 y.o.  Admit date: 01/21/2012 Discharge date: 01/23/2012  Admission Diagnoses: Dyspnea, leg swelling, fatigue   Discharge Diagnoses:  Active Problems:  SLEEP APNEA, OBSTRUCTIVE  DVT  COPD  DYSPNEA ON EXERTION  Edema  Hypothyroid  HTN (hypertension)  Bradycardia   Significant Hospital tests/ studies/ interventions and procedures   Repeat LE dopplers 7/31>>>prelim negative  2 d echo 7/31>>>The cavity size was normal. Wall thickness was normal. Systolic function was normal. The estimated ejection fraction was in the range of 55% to 60%. Features are consistent with a pseudonormal left ventricular filling pattern, with concomitant abnormal relaxation and increased filling pressure (grade 2 diastolic dysfunction). CT chest 7/31: neg for PE   History of Present Illness:  71 year old former smoker followed for COPD and OSA with hx Lung CA/xrt/chemo, DVT, A Flutter and prior DVT.  Seen By Dr Maple Hudson in our office on 7/30 for 2 mo h/o progressive dyspnea and LE swelling. Additional complaints included dry non-productive cough, and progressive fatigue. As part of the evaluation she had a CT angio of chest to r/o PE, and LE DVTs. Her CT was negative for PE, but her Lower extremities were concerning for possible DVT. Because of this the office called her for admission for evaluation and anticoagulation.  Hospital Course:   Dyspnea on exertion due to decompensated OHS/OSA, secondary PAH w/ cor pulmonale and volume overload. Her CT angio is negative for PE. Her LE dopplers are negative. Think that most of her dyspnea was due to decompensated OHS/OSA, volume overload and diastolic heart failure. Likely can contribute a large part of this to non-compliance with her CPAP. She is better w/ diuresis. Her oxygen level at rest was 92%, after ambulating <30 feet dropped to 84% l. We completed about 66ft and  she had significant dyspnea. Of note her HR never reached above 60 during this exertion. Placed her on 2 liters and she was able to sustain above 88% after resting.  Plan:  -discharge to home on diuretic regimen  -have encouraged CPAP, mandatory at night.  -oxygen 24/7  -will follow up in our clinic next week for lab work and post-hospital visit. -  COPD: not having active exacerbation  Plan  -Cont home rx   Bradycardia. During ambulation her HR stayed in the 60s, and dropped down to 50s. Hypoxia could certainly contribute to this.  Cardiology consulted. Felt that this was a chronic issue for her and not likely to be a contributing factor to her dyspnea.  Plan:  Continue [facility] F/u w/ routine card visit.   Lower extremity edema: negative for DVT.  Plan  -diuresis   HTN  Plan:  -Cont home RX  Discharge Exam: BP 116/75  Pulse 60  Temp 98 F (36.7 C) (Oral)  Resp 18  Ht 5\' 2"  (1.575 m)  Wt 107.366 kg (236 lb 11.2 oz)  BMI 43.29 kg/m2  SpO2 94% Dropped to 84% with exertion, maintains above 90% on 2 liters.   General: Obese 71 year old female.  Neuro: Oriented X 3  HEENT: Neck large, can't assess JVD  Cardiovascular: rrr  Lungs: Decreased t/o  Abdomen: Obese, + bowel sounds  Musculoskeletal: Intact  Skin: intact. Lower extremity edema   Labs at discharge Lab Results  Component Value Date   CREATININE 0.86 01/22/2012   BUN 17 01/22/2012   NA 140 01/22/2012   K 4.1 01/22/2012  CL 102 01/22/2012   CO2 30 01/22/2012   Lab Results  Component Value Date   WBC 5.9 01/23/2012   HGB 14.3 01/23/2012   HCT 41.6 01/23/2012   MCV 101.7* 01/23/2012   PLT 143* 01/23/2012   Lab Results  Component Value Date   ALT 16 01/21/2012   AST 24 01/21/2012   ALKPHOS 70 01/21/2012   BILITOT 0.6 01/21/2012   Lab Results  Component Value Date   INR 1.00 01/22/2012   INR 1.01 01/21/2012   INR 1.01 12/10/2010    Current radiology studies No results found.  Disposition:  01-Home or Self  Care  Discharge Orders    Future Appointments: Provider: Department: Dept Phone: Center:   01/29/2012 11:15 AM Waymon Budge, MD [facility] [phone] None   02/27/2012 2:30 PM Waymon Budge, MD [facility] [phone] None   05/11/2012 1:00 PM Larina Earthly, MD [facility] [phone] [facility]   05/18/2012 11:00 AM Waymon Budge, MD [facility] [phone] None     Future Orders Please Complete By Expires   Diet - low sodium heart healthy      Increase activity slowly      Call MD for:  temperature >100.4      Call MD for:  persistant nausea and vomiting      Call MD for:  difficulty breathing, headache or visual disturbances      (HEART FAILURE PATIENTS) Call MD:  Anytime you have any of the following symptoms: 1) 3 pound weight gain in 24 hours or 5 pounds in 1 week 2) shortness of breath, with or without a dry hacking cough 3) swelling in the hands, feet or stomach 4) if you have to sleep on extra pillows at night in order to breathe.        Medication List  As of 01/23/2012  1:41 PM   STOP taking these medications         metoprolol tartrate 25 MG tablet         TAKE these medications         albuterol-ipratropium 18-103 MCG/ACT inhaler   Commonly known as: COMBIVENT   Inhale 2 puffs into the lungs every 4 (four) hours as needed. Shortness of breath      aspirin 81 MG EC tablet   Take 81 mg by mouth daily.      CALCIUM + D PO   Take 2 tablets by mouth daily.      citalopram 20 MG tablet   Commonly known as: CELEXA   Take 20 mg by mouth daily.      diltiazem 120 MG 24 hr capsule   Commonly known as: CARDIZEM CD   Take 120 mg by mouth daily.      fish oil-omega-3 fatty acids 1000 MG capsule   Take 1 g by mouth daily.      furosemide 40 MG tablet   Commonly known as: LASIX   Take 1 tablet (40 mg total) by mouth 2 (two) times daily.      levothyroxine 88 MCG tablet   Commonly known as: SYNTHROID, LEVOTHROID   Take 88 mcg by mouth daily.       losartan 50 MG tablet   Commonly known as: COZAAR   Take 1 tablet (50 mg total) by mouth daily.      morphine 15 MG tablet   Commonly known as: MSIR   Take 15 mg by mouth every 4 (four) hours as needed. pain  potassium chloride SA 20 MEQ tablet   Commonly known as: K-DUR,KLOR-CON   Take 1 tablet (20 mEq total) by mouth 2 (two) times daily.      pravastatin 20 MG tablet   Commonly known as: PRAVACHOL   Take 20 mg by mouth daily.      temazepam 15 MG capsule   Commonly known as: RESTORIL   Take 15 mg by mouth at bedtime as needed. sleep      tiotropium 18 MCG inhalation capsule   Commonly known as: SPIRIVA   Place 1 capsule (18 mcg total) into inhaler and inhale daily.      Vitamin D 1000 UNITS capsule   Take 1,000 Units by mouth daily.           Follow-up Information    Follow up with Waymon Budge, MD on 01/29/2012. (at 11 am. )    Contact information:   [address] [employer] [place] [address] [phone]          Discharged Condition: stable able to ambulate ~ 50 feet before SOB. Tolerates better  w/ oxygen at 2 liters.   Physician Statement:   The Patient was personally examined, the discharge assessment and plan has been personally reviewed and I agree with ACNP Anylah Scheib's assessment and plan. > 30 minutes of time have been dedicated to discharge assessment, planning and discharge instructions.   Signed: Mikail Goostree,PETE 01/23/2012, 1:41 PM  Cleared by cardiology, will discharge with F/U with Dr. Maple Hudson in 1-2 wks.  No DVT/PE noted on studies and echo noted.  Patient seen and examined, agree with above note.  I dictated the care and orders written for this patient under my direction.

## 2012-01-23 NOTE — Clinical Documentation Improvement (Signed)
BMI DOCUMENTATION CLARIFICATION QUERY  THIS DOCUMENT IS NOT A PERMANENT PART OF THE MEDICAL RECORD  TO RESPOND TO THE THIS QUERY, FOLLOW THE INSTRUCTIONS BELOW:  1. If needed, update documentation for the patient's encounter via the notes activity.  2. Access this query again and click edit on the In [employer].  3. After updating, or not, click F2 to complete all highlighted (required) fields concerning your review. Select "additional documentation in the medical record" OR "no additional documentation provided".  4. Click Sign note button.  5. The deficiency will fall out of your In Basket *Please let us know if you are not able to complete this workflow by phone or e-mail (listed below).         01/23/12  Dear Dr.  Molli Knock  Marton Redwood  In an effort to better capture your patient's severity of illness, reflect appropriate length of stay and utilization of resources, a review of the patient medical record has revealed the following indicators.    Possible Clinical conditions - Morbid Obesity is BMI=/> 40 - Other Condition (please specify) - Cannot Clinically Determine  Supporting Information: - BMI = 42.52 per RD assessment 8/1    Reviewed: additional documentation in the medical record  Thank You,  Beverley Fiedler RN Clinical Documentation Specialist: Pager:  [phone] Health Information Management:  [phone] [facility]

## 2012-01-23 NOTE — Telephone Encounter (Signed)
Ok DME order- liquid O2 2L/m continuous  Dx copd

## 2012-01-23 NOTE — Consult Note (Signed)
CONSULT NOTE  Date: 01/23/2012               Patient Name:  [name] MRN: [id number]  DOB: [birth year] Age / Sex: 71 y.o., female        PCP: ARONSON,RICHARD A Primary Cardiologist: Peter Swaziland, MD            Referring Physician: Molli Knock              Reason for Consult: Bradycardia, dyspnea           History of Present Illness: Patient is a 71 y.o. female with a PMHx of COPD, Obstructive Sleep apnea, Lung CA - s/p XRT and chomotherapy admitted with non-productive cough and progressive fatigue / dyspnea. She reports SOB with exertion that limits her function to do home chores and also when walking to mailbox on an incline plane. She has Hx of OSA and denies use of CPAP machine for a couple of months, but she is using O2 at night. No history of lightheadedness, syncope, chest pain or weakness reported.    Medications: Outpatient medications: Prescriptions prior to admission  Medication Sig Dispense Refill  . albuterol-ipratropium (COMBIVENT) 18-103 MCG/ACT inhaler Inhale 2 puffs into the lungs every 4 (four) hours as needed. Shortness of breath      . aspirin 81 MG EC tablet Take 81 mg by mouth daily.       . Calcium Carbonate-Vitamin D (CALCIUM + D PO) Take 2 tablets by mouth daily.        . Cholecalciferol (VITAMIN D) 1000 UNITS capsule Take 1,000 Units by mouth daily.        . citalopram (CELEXA) 20 MG tablet Take 20 mg by mouth daily.       Marland Kitchen diltiazem (CARDIZEM CD) 120 MG 24 hr capsule Take 120 mg by mouth daily.      . fish oil-omega-3 fatty acids 1000 MG capsule Take 1 g by mouth daily.       . furosemide (LASIX) 40 MG tablet Take 40 mg by mouth daily.        Marland Kitchen levothyroxine (SYNTHROID, LEVOTHROID) 88 MCG tablet Take 88 mcg by mouth daily.      Marland Kitchen losartan (COZAAR) 50 MG tablet Take 1 tablet (50 mg total) by mouth daily.  90 tablet  3  . metoprolol tartrate (LOPRESSOR) 25 MG tablet Take 1 tablet (25 mg total) by mouth 2 (two) times daily.  180 tablet  3  . morphine (MSIR)  15 MG tablet Take 15 mg by mouth every 4 (four) hours as needed. pain      . pravastatin (PRAVACHOL) 20 MG tablet Take 20 mg by mouth daily.        . temazepam (RESTORIL) 15 MG capsule Take 15 mg by mouth at bedtime as needed. sleep      . tiotropium (SPIRIVA HANDIHALER) 18 MCG inhalation capsule Place 1 capsule (18 mcg total) into inhaler and inhale daily.  30 capsule  3    Current medications: Current Facility-Administered Medications  Medication Dose Route Frequency Provider Last Rate Last Dose  . 0.9 %  sodium chloride infusion   Intravenous Continuous Simonne Martinet, NP      . albuterol-ipratropium (COMBIVENT) inhaler 2 puff  2 puff Inhalation Q4H PRN Simonne Martinet, NP      . aspirin EC tablet 81 mg  81 mg Oral Daily Alyson Reedy, MD   81 mg at 01/23/12 1028  . calcium-vitamin D (  OSCAL WITH D) 500-200 MG-UNIT per tablet 1 tablet  1 tablet Oral BID Alyson Reedy, MD   1 tablet at 01/23/12 1029  . cholecalciferol (VITAMIN D) tablet 1,000 Units  1,000 Units Oral Daily Simonne Martinet, NP   1,000 Units at 01/23/12 1030  . citalopram (CELEXA) tablet 20 mg  20 mg Oral Daily Simonne Martinet, NP   20 mg at 01/23/12 1029  . diltiazem (CARDIZEM CD) 24 hr capsule 120 mg  120 mg Oral Daily Simonne Martinet, NP   120 mg at 01/23/12 1031  . docusate sodium (COLACE) capsule 100 mg  100 mg Oral BID Simonne Martinet, NP   100 mg at 01/23/12 1029  . furosemide (LASIX) injection 40 mg  40 mg Intravenous Q12H Simonne Martinet, NP   40 mg at 01/23/12 1032  . heparin injection 5,000 Units  5,000 Units Subcutaneous Q8H Simonne Martinet, NP   5,000 Units at 01/23/12 0539  . levothyroxine (SYNTHROID, LEVOTHROID) tablet 88 mcg  88 mcg Oral Daily Simonne Martinet, NP   88 mcg at 01/23/12 1030  . losartan (COZAAR) tablet 50 mg  50 mg Oral Daily Simonne Martinet, NP   50 mg at 01/23/12 1030  . pantoprazole (PROTONIX) EC tablet 40 mg  40 mg Oral Q1200 Simonne Martinet, NP   40 mg at 01/22/12 1057  . potassium chloride  SA (K-DUR,KLOR-CON) CR tablet 20 mEq  20 mEq Oral BID Alyson Reedy, MD   20 mEq at 01/23/12 1030  . simvastatin (ZOCOR) tablet 10 mg  10 mg Oral q1800 Simonne Martinet, NP   10 mg at 01/22/12 1830  . temazepam (RESTORIL) capsule 15 mg  15 mg Oral QHS PRN Simonne Martinet, NP   15 mg at 01/23/12 0039  . tiotropium (SPIRIVA) inhalation capsule 18 mcg  18 mcg Inhalation Daily Simonne Martinet, NP   18 mcg at 01/23/12 0738  . DISCONTD: metoprolol tartrate (LOPRESSOR) tablet 25 mg  25 mg Oral BID Simonne Martinet, NP   25 mg at 01/22/12 1056  . DISCONTD: potassium chloride 20 MEQ/15ML (10%) liquid 20 mEq  20 mEq Oral Q12H Simonne Martinet, NP   20 mEq at 01/21/12 2258     Allergies  Allergen Reactions  . Amoxicillin Other (See Comments)    Funny feeling     Past Medical History  Diagnosis Date  . COPD (chronic obstructive pulmonary disease)   . H/O: lung cancer     reported in remission  . DVT (deep venous thrombosis) 2004    BLE  . AAA (abdominal aortic aneurysm)   . Hypothyroidism   . Hyperlipidemia   . History of uterine fibroid   . Hiatal hernia   . History of fibrocystic disease of breast   . HTN (hypertension)   . Anxiety and depression   . PAD (peripheral artery disease)   . Ovarian mass     right benign  . Anticoagulant long-term use     Failed on Coumadin. On Xarelto  . Atrial fib/flutter, transient June 2012  . Normal nuclear stress test 2012    May 2012  . Esophageal dysmotility   . Small cell carcinoma of lung     NON-SMALL CELL CARCINOMA OF THE LUNG, METASTATIC TO THE SUPRACLAVICULAR AND MEDIASTINAL LYMPH NODES; in remission  . History of bronchitis   . Exertional dyspnea   . OSA (obstructive sleep apnea)     "haven't been  using my CPAP lately" (01/21/12)  . OA (osteoarthritis)     "knees; left shoulder"  . Chronic lower back pain   . Anxiety     Past Surgical History  Procedure Date  . Abdominal aortic aneurysm repair ~ 2010    stent graft  . Replacement  total knee ~ 2008    left  . Thyroidectomy ~ 1964  . Bladder surgery ~ 2003    sling  . Cardiovascular stress test 2012    No ischemia  . Breast biopsy 1960's    both breast's - benign  . Cataract extraction w/ intraocular lens  implant, bilateral ~ 2009    Family History  Problem Relation Age of Onset  . Heart disease Father   . Heart attack Father   . Diabetes type II Mother   . Heart disease Brother   . Heart disease Sister     See's Dr. Elease Hashimoto    Social History:  reports that she quit smoking about 10 years ago. Her smoking use included Cigarettes. She has a 40 pack-year smoking history. She has never used smokeless tobacco. She reports that she does not drink alcohol or use illicit drugs.   Review of Systems: Constitutional:  denies fever, chills, diaphoresis, appetite change and fatigue.  HEENT: denies photophobia, eye pain, redness, hearing loss, ear pain, congestion, sore throat, rhinorrhea, sneezing, neck pain, neck stiffness and tinnitus.  Respiratory: admits to SOB, DOE, denies cough, chest tightness, and wheezing.  Cardiovascular: denies chest pain, palpitations.  Gastrointestinal: denies nausea, vomiting, abdominal pain, diarrhea, constipation, blood in stool.  Genitourinary: denies dysuria, urgency, frequency, hematuria, flank pain and difficulty urinating.  Musculoskeletal: denies  myalgias, back pain, joint swelling, arthralgias and gait problem.   Skin: denies pallor, rash and wound.  Neurological: denies dizziness, seizures, syncope, weakness, light-headedness, numbness and headaches.   Hematological: denies adenopathy, easy bruising, personal or family bleeding history.  Psychiatric/ Behavioral: denies suicidal ideation, mood changes, confusion, nervousness, sleep disturbance and agitation.    Physical Exam: BP 116/75  Pulse 60  Temp 98 F (36.7 C) (Oral)  Resp 18  Ht 5\' 2"  (1.575 m)  Wt 236 lb 11.2 oz (107.366 kg)  BMI 43.29 kg/m2  SpO2 94%    General: Vital signs reviewed and noted. Well-developed, well-nourished, morbid obesity  Head: Normocephalic, atraumatic, sclera anicteric, mucus membranes are moist  Neck: Supple. Negative for carotid bruits. JVD not elevated.  Lungs:  Clear bilaterally to auscultation without wheezes, rales, or rhonchi. Breathing is unlabored.  Heart: Normal S1 S2. No murmurs, HR 63.  Abdomen:  Soft, non-tender, non-distended with normoactive bowel sounds. No hepatomegaly. No rebound/guarding. No obvious abdominal masses. obesity  MSK: Strength and the appear normal for age.  Extremities: No clubbing or cyanosis. Trace edema, + chronic stasis changes  Neurologic: Alert and oriented X 3. Moves all extremities spontaneously.  Psych: Responds to questions appropriately with a normal affect.    Lab results: Basic Metabolic Panel:  Lab 01/22/12 1610 01/21/12 1623 01/21/12 1434  NA 140 140 139  K 4.1 4.2 4.0  CL 102 103 102  CO2 30 25 27   GLUCOSE 128* 80 88  BUN 17 12 13   CREATININE 0.86 0.80 0.80  CALCIUM 9.7 9.6 9.6  MG -- 2.0 --  PHOS -- 3.5 --    Liver Function Tests:  Lab 01/21/12 1623  AST 24  ALT 16  ALKPHOS 70  BILITOT 0.6  PROT 7.1  ALBUMIN 3.6   No results found for  this basename: LIPASE:3,AMYLASE:3 in the last 168 hours No results found for this basename: AMMONIA:3 in the last 168 hours  CBC:  Lab 01/23/12 0525 01/22/12 0150 01/21/12 1623 01/21/12 1434 01/20/12 1258  WBC 5.9 5.0 5.9 -- --  NEUTROABS -- -- -- 3.9 3.6  HGB 14.3 14.2 14.2 -- --  HCT 41.6 41.3 42.9 -- --  MCV 101.7* 101.5* 101.9* 101.4* 104.2*  PLT 143* 141* 144* -- --    Cardiac Enzymes:  Lab 01/22/12 1015 01/22/12 0151 01/21/12 1820  CKTOTAL 87 85 83  CKMB 2.6 3.0 3.1  CKMBINDEX -- -- --  TROPONINI <0.30 <0.30 <0.30    BNP: No components found with this basename: POCBNP:3  CBG: No results found for this basename: GLUCAP:5 in the last 168 hours  Coagulation Studies:  Basename 01/22/12 0150  01/21/12 1434  LABPROT 13.4 13.5  INR 1.00 1.01    Tele:  NSR.   Had some sinus bradycardia this am.   Assessment & Plan:  1. Bradycardia:  She has been bradycardic for years.  She has documented HR of 60 on several OV.  She has documented diastolic dysfunction and pulmonary hypertension ( est. PA pressure of 55 mmHg) and is on Diltiazem.   The diltiazem is potentially  beneficial in both of these conditions and I would recommend that we continue it.  I do not think that it is contributing to her dyspnea.  She has multiple reasons for her dyspnea  - morbid obesity with significant deconditioning - obstructive sleep apnea - has not been wearing her CPAP mask recently - lung ca with hx of XRT - COPD  Her cardiac enzymes are negative - no indication for ACS.   No additional cardiac evaluation needed at this time.  She may follow up with Dr. Swaziland at her regularly scheduled apt.  Will sign off.  Call for questions.   Vesta Mixer, Montez Hageman., MD, [facility] 01/23/2012, 11:49 AM

## 2012-01-28 NOTE — Progress Notes (Signed)
Rush Farmer, M.D. [facility] Pulmonary/Critical Care Medicine. Pager: [phone]. After hours pager: [phone].

## 2012-01-28 NOTE — Discharge Summary (Signed)
Rush Farmer, M.D. [facility] Pulmonary/Critical Care Medicine. Pager: [phone]. After hours pager: [phone].

## 2012-01-29 ENCOUNTER — Ambulatory Visit (INDEPENDENT_AMBULATORY_CARE_PROVIDER_SITE_OTHER): Payer: Medicare Other | Admitting: Internal Medicine

## 2012-01-29 ENCOUNTER — Other Ambulatory Visit (INDEPENDENT_AMBULATORY_CARE_PROVIDER_SITE_OTHER): Payer: Medicare Other

## 2012-01-29 ENCOUNTER — Encounter: Payer: Self-pay | Admitting: Internal Medicine

## 2012-01-29 VITALS — BP 112/76 | HR 56 | Ht 62.0 in | Wt 237.4 lb

## 2012-01-29 DIAGNOSIS — R0989 Other specified symptoms and signs involving the circulatory and respiratory systems: Secondary | ICD-10-CM | POA: Diagnosis not present

## 2012-01-29 DIAGNOSIS — J449 Chronic obstructive pulmonary disease, unspecified: Secondary | ICD-10-CM

## 2012-01-29 DIAGNOSIS — I82409 Acute embolism and thrombosis of unspecified deep veins of unspecified lower extremity: Secondary | ICD-10-CM | POA: Diagnosis not present

## 2012-01-29 DIAGNOSIS — G4733 Obstructive sleep apnea (adult) (pediatric): Secondary | ICD-10-CM

## 2012-01-29 DIAGNOSIS — R0609 Other forms of dyspnea: Secondary | ICD-10-CM

## 2012-01-29 DIAGNOSIS — J4489 Other specified chronic obstructive pulmonary disease: Secondary | ICD-10-CM

## 2012-01-29 LAB — BASIC METABOLIC PANEL
BUN: 23 mg/dL (ref 6–23)
CO2: 31 mEq/L (ref 19–32)
Calcium: 8.8 mg/dL (ref 8.4–10.5)
Chloride: 100 mEq/L (ref 96–112)
Creatinine, Ser: 0.9 mg/dL (ref 0.4–1.2)
GFR: 64.39 mL/min (ref >60.00)
Glucose, Bld: 75 mg/dL (ref 70–99)
Potassium: 4.6 mEq/L (ref 3.5–5.1)
Sodium: 135 mEq/L (ref 135–145)

## 2012-01-29 MED ORDER — ALBUTEROL SULFATE HFA 108 (90 BASE) MCG/ACT IN AERS: 2.0000 | INHALATION_SPRAY | Freq: Four times a day (QID) | RESPIRATORY_TRACT | Status: DC | PRN

## 2012-01-29 NOTE — Patient Instructions (Addendum)
Continue Spiriva  Script sent to your drug store to change Combivent to albuterol HFA rescue inhaler  Use your oxygen 2 L/M continuously  Please keep trying CPAP. It is good for your heart.  Let us know if you have problems.

## 2012-01-29 NOTE — Progress Notes (Signed)
05/19/11- 31 yoF former smoker followed for COPD and OSA with hx Lung CA/xrt/chemo, DVT, AFlutter LOV- 04/16/10 Has had flu vaccine. Reports coughing up a streak of blood about 2 weeks ago. Chest has been tight, not much cough-mostly with meals. She thinks swallowing triggers her cough. More short of breath than usual. Some reflux. No chest pain or palpitation fever, sweat or enlarged glands. Using Combivent at least once most days has made her feel better. Blows nose occasionally with some blood or green. Denies headache. Continues CPAP at 12 CWP/[facility]/O2 2 L per minute for sleep Had CT scan of chest 05/06/2011 to followup of her aortic aneurysm. Showed emphysema, stable aneurysm, enlarged right lobe of thyroid, coronary disease. Images reviewed.  01/20/12- 77 yoF former smoker followed for COPD and OSA with hx Lung CA/xrt/chemo, DVT, AFlutter Increased SOB x 2 months and getting worse; unable to do many activities Dry cough treated w/ cough drops; denies fever, sweat, chest pain.  Feet and legs stay swollen. + Hx DVT, on ASA- eval by Dr Jacky Kindle, Dr early. Cardiology has told her "heart is fine'. Dr Darnelle Catalan has told her no recurrence of lung Ca. She stopped CPAP after bad cold last winter ([facility]). Still sleeping with O2 2L/M  01/29/12-  71 yoF former smoker followed for COPD and OSA with hx Lung CA/xrt/chemo, DVT, AFlutter Post hospital 01/21/12-01/23/12 decompensated OHS/ OSA with fluid overload. Started on home oxygen. Trying CPAP again using nasal pillows. temazepam helps. Has a rescue Combivent but also uses Spiriva sore address this. She very rarely needs her rescue inhaler but we are going  to switch to albuterol Leg vein dopplers 01/21/12-- Neg, although we initially got a telephone report of superficial phlebitis which is when we sent her to the hospital for evaluation. CTa- 01/21/12-  IMPRESSION:  No evidence for pulmonary embolism.  Small right pleural effusion.  Diffuse emphysematous  disease and subtle parenchymal densities in  the lungs. Findings could represent atelectasis or mild edema.  Few areas of volume loss but no focal consolidation.  Stable saccular aneurysm involving descending thoracic aorta.  Stable nodules in the lower neck may be related to thyroid nodules.  Original Report Authenticated By: Richarda Overlie, M.D.   ROS-see HPI Constitutional:   No-   weight loss, night sweats, fevers, chills, fatigue, lassitude. HEENT:   No-  headaches, difficulty swallowing, tooth/dental problems, sore throat,       No-  sneezing, itching, ear ache, nasal congestion, post nasal drip,  CV:  No-   chest pain, orthopnea, PND, +less swelling in lower extremities, No-anasarca, dizziness,  +Occasional palpitations Resp:    +shortness of breath with exertion or at rest.              No- productive cough,  +non-productive cough,  no-coughing up of blood.              No-   change in color of mucus.  No- wheezing.   Skin: No-   rash or lesions. GI:  No-   heartburn, indigestion, abdominal pain, nausea, vomiting,  GU: N MS:  No-   joint pain or swelling.   Neuro-     nothing unusual Psych:  No- change in mood or affect. No depression or anxiety.  No memory loss.  OBJ- Physical Exam General- Alert, Oriented, Affect-appropriate, Distress- looks strained, obese. O2 2L/ [facility] Skin- rash-none, lesions- none, excoriation- none Lymphadenopathy- none Head- atraumatic            Eyes-  Gross vision intact, PERRLA, conjunctivae and secretions clear            Ears- Hearing, canals-normal            Nose- Clear, no-Septal dev, mucus, polyps, erosion, perforation             Throat- Mallampati II , mucosa clear , drainage- none, tonsils- atrophic Neck- flexible , trachea midline, no stridor , thyroid nl, carotid no bruit Chest - symmetrical excursion , unlabored           Heart/CV- RRR ~60 w/ skips, no murmur , no gallop  , no rub, nl s1 s2                           - JVD- none ,  1-2+chronic brawny edema, + stasis changes- red, varices- none           Lung- clear to P&A, wheeze- none, cough- none , dullness-none, rub- none           Chest wall-  Abd-  Br/ Gen/ Rectal- Not done, not indicated Extrem- cyanosis- none, clubbing, none, atrophy- none, strength- nl Neuro- grossly intact to observation

## 2012-02-04 ENCOUNTER — Encounter: Payer: Self-pay | Admitting: Internal Medicine

## 2012-02-04 NOTE — Assessment & Plan Note (Addendum)
Resume CPAP/[facility] together with continuous oxygen at 2 L. she will work with advanced to get comfortable and will continue using temazepam.

## 2012-02-04 NOTE — Assessment & Plan Note (Addendum)
Emphysema demonstrated on chest CT Plan continue Spiriva. Change Combivent to albuterol rescue inhaler. Continue oxygen 2 L/[facility]

## 2012-02-04 NOTE — Assessment & Plan Note (Signed)
Postphlebitic syndrome. Keep legs elevated when sitting. Recommend support hose.

## 2012-02-04 NOTE — Assessment & Plan Note (Signed)
At hospital discharge she was instructed on symptoms of heart failure to report including 3 pound weight gain in 24 hours or 5 pounds in one week, increased shortness of breath, swelling, need to sleep on extra pillows.

## 2012-02-25 DIAGNOSIS — H53009 Unspecified amblyopia, unspecified eye: Secondary | ICD-10-CM | POA: Diagnosis not present

## 2012-02-25 DIAGNOSIS — H5210 Myopia, unspecified eye: Secondary | ICD-10-CM | POA: Diagnosis not present

## 2012-02-25 DIAGNOSIS — H04129 Dry eye syndrome of unspecified lacrimal gland: Secondary | ICD-10-CM | POA: Diagnosis not present

## 2012-02-27 ENCOUNTER — Ambulatory Visit: Payer: Medicare Other | Admitting: Internal Medicine

## 2012-03-07 DIAGNOSIS — Z23 Encounter for immunization: Secondary | ICD-10-CM | POA: Diagnosis not present

## 2012-03-09 DIAGNOSIS — G43909 Migraine, unspecified, not intractable, without status migrainosus: Secondary | ICD-10-CM | POA: Diagnosis not present

## 2012-03-09 DIAGNOSIS — H43819 Vitreous degeneration, unspecified eye: Secondary | ICD-10-CM | POA: Diagnosis not present

## 2012-03-11 ENCOUNTER — Ambulatory Visit: Payer: Medicare Other | Admitting: Internal Medicine

## 2012-04-05 DIAGNOSIS — M5137 Other intervertebral disc degeneration, lumbosacral region: Secondary | ICD-10-CM | POA: Diagnosis not present

## 2012-04-05 DIAGNOSIS — M545 Low back pain, unspecified: Secondary | ICD-10-CM | POA: Diagnosis not present

## 2012-04-05 DIAGNOSIS — G894 Chronic pain syndrome: Secondary | ICD-10-CM | POA: Diagnosis not present

## 2012-04-12 ENCOUNTER — Encounter: Payer: Self-pay | Admitting: Internal Medicine

## 2012-04-12 ENCOUNTER — Ambulatory Visit (INDEPENDENT_AMBULATORY_CARE_PROVIDER_SITE_OTHER): Payer: Medicare Other | Admitting: Internal Medicine

## 2012-04-12 VITALS — BP 118/62 | HR 72 | Ht 62.0 in | Wt 236.0 lb

## 2012-04-12 DIAGNOSIS — G4733 Obstructive sleep apnea (adult) (pediatric): Secondary | ICD-10-CM | POA: Diagnosis not present

## 2012-04-12 DIAGNOSIS — J449 Chronic obstructive pulmonary disease, unspecified: Secondary | ICD-10-CM

## 2012-04-12 NOTE — Progress Notes (Signed)
05/19/11- 66 yoF former smoker followed for COPD and OSA with hx Lung CA/xrt/chemo, DVT, AFlutter LOV- 04/16/10 Has had flu vaccine. Reports coughing up a streak of blood about 2 weeks ago. Chest has been tight, not much cough-mostly with meals. She thinks swallowing triggers her cough. More short of breath than usual. Some reflux. No chest pain or palpitation fever, sweat or enlarged glands. Using Combivent at least once most days has made her feel better. Blows nose occasionally with some blood or green. Denies headache. Continues CPAP at 12 CWP/[facility]/O2 2 L per minute for sleep Had CT scan of chest 05/06/2011 to followup of her aortic aneurysm. Showed emphysema, stable aneurysm, enlarged right lobe of thyroid, coronary disease. Images reviewed.  01/20/12- 24 yoF former smoker followed for COPD and OSA with hx Lung CA/xrt/chemo, DVT, AFlutter Increased SOB x 2 months and getting worse; unable to do many activities Dry cough treated w/ cough drops; denies fever, sweat, chest pain.  Feet and legs stay swollen. + Hx DVT, on ASA- eval by Dr Jacky Kindle, Dr early. Cardiology has told her "heart is fine'. Dr Darnelle Catalan has told her no recurrence of lung Ca. She stopped CPAP after bad cold last winter ([facility]). Still sleeping with O2 2L/M  01/29/12-  71 yoF former smoker followed for COPD and OSA with hx Lung CA/xrt/chemo, DVT, AFlutter Post hospital 01/21/12-01/23/12 decompensated OHS/ OSA with fluid overload. Started on home oxygen. Trying CPAP again using nasal pillows. temazepam helps. Has a rescue Combivent but also uses Spiriva sore address this. She very rarely needs her rescue inhaler but we are going  to switch to albuterol Leg vein dopplers 01/21/12-- Neg, although we initially got a telephone report of superficial phlebitis which is when we sent her to the hospital for evaluation. CTa- 01/21/12-  IMPRESSION:  No evidence for pulmonary embolism.  Small right pleural effusion.  Diffuse emphysematous  disease and subtle parenchymal densities in  the lungs. Findings could represent atelectasis or mild edema.  Few areas of volume loss but no focal consolidation.  Stable saccular aneurysm involving descending thoracic aorta.  Stable nodules in the lower neck may be related to thyroid nodules.  Original Report Authenticated By: Richarda Overlie, M.D.   04/12/12- 87 yoF former smoker followed for COPD and OSA with hx Lung CA/xrt/chemo, DVT, AFlutter Had flu vaccine. Not using CPAP at all. Continues oxygen 2 L/[facility]. Rarely needs albuterol rescue inhaler. Asks about portable oxygen concentrator. No longer on Coumadin for history of DVT. Just using aspirin. COPD assessment test (CAT) score 22/40  ROS-see HPI Constitutional:   No-   weight loss, night sweats, fevers, chills, fatigue, lassitude. HEENT:   No-  headaches, difficulty swallowing, tooth/dental problems, sore throat,       No-  sneezing, itching, ear ache, nasal congestion, post nasal drip,  CV:  No-   chest pain, orthopnea, PND, +less swelling in lower extremities, No-anasarca, dizziness,  +Occasional palpitations Resp:    +shortness of breath with exertion or at rest.              No- productive cough,  +non-productive cough,  no-coughing up of blood.              No-   change in color of mucus.  No- wheezing.   Skin: No-   rash or lesions. GI:  No-   heartburn, indigestion, abdominal pain, nausea, vomiting,  GU: N MS:  No-   joint pain or swelling.   Neuro-  nothing unusual Psych:  No- change in mood or affect. No depression or anxiety.  No memory loss.  OBJ- Physical Exam General- Alert, Oriented, Affect-appropriate, Distress- looks strained, obese. O2 2L/ [facility] Skin- rash-none, lesions- none, excoriation- none Lymphadenopathy- none Head- atraumatic            Eyes- Gross vision intact, PERRLA, conjunctivae and secretions clear            Ears- Hearing, canals-normal            Nose- Clear, no-Septal dev, mucus, polyps,  erosion, perforation             Throat- Mallampati II , mucosa clear , drainage- none, tonsils- atrophic Neck- flexible , trachea midline, no stridor , thyroid nl, carotid no bruit Chest - symmetrical excursion , unlabored           Heart/CV- IRR ~60 w/ skips, no murmur , no gallop  , no rub, nl s1 s2                           - JVD- none , 1-2+chronic brawny edema, + stasis changes- red, varices- none           Lung- clear to P&A, wheeze- none, cough- none , dullness-none, rub- none           Chest wall-  Abd-  Br/ Gen/ Rectal- Not done, not indicated Extrem- cyanosis- none, clubbing, none, atrophy- none, strength- nl Neuro- grossly intact to observation

## 2012-04-12 NOTE — Patient Instructions (Addendum)
Sample Spiriva  Please call if needed

## 2012-04-18 NOTE — Assessment & Plan Note (Signed)
We have discussed alternatives to CPAP. I think she will choose to stay with oxygen

## 2012-04-18 NOTE — Assessment & Plan Note (Signed)
Stable with rare need for rescue inhaler but she remains oxygen dependent. Plan-she will ask [facility] about light portable oxygen concentrator

## 2012-04-19 DIAGNOSIS — H43819 Vitreous degeneration, unspecified eye: Secondary | ICD-10-CM | POA: Diagnosis not present

## 2012-04-19 DIAGNOSIS — H5310 Unspecified subjective visual disturbances: Secondary | ICD-10-CM | POA: Diagnosis not present

## 2012-04-20 ENCOUNTER — Telehealth: Payer: Self-pay

## 2012-04-20 DIAGNOSIS — I4892 Unspecified atrial flutter: Secondary | ICD-10-CM

## 2012-04-20 NOTE — Telephone Encounter (Signed)
Received a call from Dr.McCuen wanting to let Dr.Jordan know she examined patient's eyes which were normal,but patient had a episode 2 weeks ago in which a shade came down over one of her eyes.Stated question for ischemia.Dr.Jordan out of office this week,spoke to Norma Fredrickson NP she advised to schedule carotid dopplers.

## 2012-04-28 ENCOUNTER — Encounter (INDEPENDENT_AMBULATORY_CARE_PROVIDER_SITE_OTHER): Payer: Medicare Other

## 2012-04-28 DIAGNOSIS — I4892 Unspecified atrial flutter: Secondary | ICD-10-CM

## 2012-04-28 DIAGNOSIS — H53129 Transient visual loss, unspecified eye: Secondary | ICD-10-CM | POA: Diagnosis not present

## 2012-04-29 ENCOUNTER — Encounter: Payer: Self-pay | Admitting: Internal Medicine

## 2012-05-03 DIAGNOSIS — J449 Chronic obstructive pulmonary disease, unspecified: Secondary | ICD-10-CM | POA: Diagnosis not present

## 2012-05-03 DIAGNOSIS — R82998 Other abnormal findings in urine: Secondary | ICD-10-CM | POA: Diagnosis not present

## 2012-05-03 DIAGNOSIS — I10 Essential (primary) hypertension: Secondary | ICD-10-CM | POA: Diagnosis not present

## 2012-05-03 DIAGNOSIS — E785 Hyperlipidemia, unspecified: Secondary | ICD-10-CM | POA: Diagnosis not present

## 2012-05-06 ENCOUNTER — Telehealth: Payer: Self-pay | Admitting: Internal Medicine

## 2012-05-06 NOTE — Telephone Encounter (Signed)
Last note states return in 6 months or sooner if needed--ok to cancel 11/26 appt

## 2012-05-06 NOTE — Telephone Encounter (Signed)
I spoke with pt and is aware she is not due to follow up until March 2014. She voiced her understanding. appt has been cancelled. Nothing further was needed

## 2012-05-07 ENCOUNTER — Other Ambulatory Visit: Payer: Self-pay | Admitting: Vascular Surgery

## 2012-05-07 DIAGNOSIS — I714 Abdominal aortic aneurysm, without rupture: Secondary | ICD-10-CM | POA: Diagnosis not present

## 2012-05-07 LAB — BUN: BUN: 15 mg/dL (ref 6–23)

## 2012-05-07 LAB — CREATININE, SERUM: Creat: 0.81 mg/dL (ref 0.50–1.10)

## 2012-05-10 ENCOUNTER — Encounter: Payer: Self-pay | Admitting: Neurosurgery

## 2012-05-11 ENCOUNTER — Ambulatory Visit (INDEPENDENT_AMBULATORY_CARE_PROVIDER_SITE_OTHER): Payer: Medicare Other | Admitting: Neurosurgery

## 2012-05-11 ENCOUNTER — Encounter: Payer: Self-pay | Admitting: Neurosurgery

## 2012-05-11 ENCOUNTER — Ambulatory Visit
Admission: RE | Admit: 2012-05-11 | Discharge: 2012-05-11 | Disposition: A | Discharge status: Home / Self Care | Payer: Medicare Other | Source: Ambulatory Visit | Attending: Vascular Surgery | Admitting: Vascular Surgery

## 2012-05-11 VITALS — BP 125/79 | HR 82 | Resp 16 | Ht 62.0 in | Wt 236.5 lb

## 2012-05-11 DIAGNOSIS — Z48812 Encounter for surgical aftercare following surgery on the circulatory system: Secondary | ICD-10-CM

## 2012-05-11 DIAGNOSIS — I714 Abdominal aortic aneurysm, without rupture, unspecified: Secondary | ICD-10-CM

## 2012-05-11 DIAGNOSIS — I712 Thoracic aortic aneurysm, without rupture: Secondary | ICD-10-CM | POA: Diagnosis not present

## 2012-05-11 MED ORDER — IOHEXOL 350 MG/ML SOLN
100.0000 mL | Freq: Once | INTRAVENOUS | Status: AC | PRN
  Administered 2012-05-11: 100 mL via INTRAVENOUS

## 2012-05-11 NOTE — Progress Notes (Addendum)
[facility] AAA/PAD/PVD Office Note  CC: AAA surveillance Referring Physician: Early  History of Present Illness: 71 year old female patient who is status post stent graft repair of a infrarenal AAA in October 2010. The patient denies any unusual abdominal or back pain. Patient's also monitored for a saccular aneurysm of the mid descending thoracic aorta.  Past Medical History  Diagnosis Date  . COPD (chronic obstructive pulmonary disease)   . H/O: lung cancer     reported in remission  . DVT (deep venous thrombosis) 2004    BLE  . AAA (abdominal aortic aneurysm)   . Hypothyroidism   . Hyperlipidemia   . History of uterine fibroid   . Hiatal hernia   . History of fibrocystic disease of breast   . HTN (hypertension)   . Anxiety and depression   . PAD (peripheral artery disease)   . Ovarian mass     right benign  . Anticoagulant long-term use     Failed on Coumadin. On Xarelto  . Atrial fib/flutter, transient June 2012  . Normal nuclear stress test 2012    May 2012  . Esophageal dysmotility   . History of bronchitis   . Exertional dyspnea   . OSA (obstructive sleep apnea)     "haven't been using my CPAP lately" (01/21/12)  . OA (osteoarthritis)     "knees; left shoulder"  . Chronic lower back pain   . Anxiety   . Small cell carcinoma of lung 2004    NON-SMALL CELL CARCINOMA OF THE LUNG, METASTATIC TO THE SUPRACLAVICULAR AND MEDIASTINAL LYMPH NODES; in remission    ROS: [x]  Positive   [ ]  Denies    General: [ ]  Weight loss, [ ]  Fever, [ ]  chills Neurologic: [ ]  Dizziness, [ ]  Blackouts, [ ]  Seizure [ ]  Stroke, [ ]  "Mini stroke", [ ]  Slurred speech, [ ]  Temporary blindness; [ ]  weakness in arms or legs, [ ]  Hoarseness Cardiac: [ ]  Chest pain/pressure, [ ]  Shortness of breath at rest [ ]  Shortness of breath with exertion, [ ]  Atrial fibrillation or irregular heartbeat Vascular: [ ]  Pain in legs with walking, [ ]  Pain in legs at rest, [ ]  Pain  in legs at night,  [ ]  Non-healing ulcer, [ ]  Blood clot in vein/DVT,   Pulmonary: [ ]  Home oxygen, [ ]  Productive cough, [ ]  Coughing up blood, [ ]  Asthma,  [ ]  Wheezing Musculoskeletal:  [ ]  Arthritis, [ ]  Low back pain, [ ]  Joint pain Hematologic: [ ]  Easy Bruising, [ ]  Anemia; [ ]  Hepatitis Gastrointestinal: [ ]  Blood in stool, [ ]  Gastroesophageal Reflux/heartburn, [ ]  Trouble swallowing Urinary: [ ]  chronic Kidney disease, [ ]  on HD - [ ]  MWF or [ ]  TTHS, [ ]  Burning with urination, [ ]  Difficulty urinating Skin: [ ]  Rashes, [ ]  Wounds Psychological: [ ]  Anxiety, [ ]  Depression   Social History History  Substance Use Topics  . Smoking status: Former Smoker -- 1.0 packs/day for 40 years    Types: Cigarettes    Quit date: 06/23/2001  . Smokeless tobacco: Never Used  . Alcohol Use: No    Family History Family History  Problem Relation Age of Onset  . Heart disease Father   . Heart attack Father   . Diabetes type II Mother   . Heart disease Brother   . Heart disease Sister     See's Dr. Elease Hashimoto    Allergies  Allergen Reactions  .  Amoxicillin Other (See Comments)    Funny feeling    Current Outpatient Prescriptions  Medication Sig Dispense Refill  . albuterol (PROVENTIL HFA;VENTOLIN HFA) 108 (90 BASE) MCG/ACT inhaler Inhale 2 puffs into the lungs every 6 (six) hours as needed for wheezing or shortness of breath.  1 Inhaler  prn  . aspirin 81 MG EC tablet Take 81 mg by mouth daily.       . Calcium Carbonate-Vitamin D (CALCIUM + D PO) Take 2 tablets by mouth daily.        . Cholecalciferol (VITAMIN D) 1000 UNITS capsule Take 1,000 Units by mouth daily.        . citalopram (CELEXA) 20 MG tablet Take 20 mg by mouth daily.       Marland Kitchen diltiazem (CARDIZEM CD) 120 MG 24 hr capsule Take 120 mg by mouth daily.      Marland Kitchen doxycycline (VIBRA-TABS) 100 MG tablet Take by mouth.      . fish oil-omega-3 fatty acids 1000 MG capsule Take 1 g by mouth daily.       . furosemide (LASIX) 40 MG  tablet Take 1 tablet (40 mg total) by mouth 2 (two) times daily.  60 tablet  6  . levothyroxine (SYNTHROID, LEVOTHROID) 88 MCG tablet Take 88 mcg by mouth daily.      Marland Kitchen losartan (COZAAR) 50 MG tablet Take 1 tablet (50 mg total) by mouth daily.  90 tablet  3  . morphine (MSIR) 15 MG tablet Take 15 mg by mouth every 4 (four) hours as needed. pain      . potassium chloride SA (K-DUR,KLOR-CON) 20 MEQ tablet Take 1 tablet (20 mEq total) by mouth 2 (two) times daily.  60 tablet  6  . pravastatin (PRAVACHOL) 20 MG tablet Take 20 mg by mouth daily.        . temazepam (RESTORIL) 15 MG capsule Take 15 mg by mouth at bedtime as needed. sleep      . tiotropium (SPIRIVA HANDIHALER) 18 MCG inhalation capsule Place 1 capsule (18 mcg total) into inhaler and inhale daily.  30 capsule  3   No current facility-administered medications for this visit.   Facility-Administered Medications Ordered in Other Visits  Medication Dose Route Frequency Provider Last Rate Last Dose  . [COMPLETED] iohexol (OMNIPAQUE) 350 MG/ML injection 100 mL  100 mL Intravenous Once PRN Medication Radiologist, MD   100 mL at 05/11/12 1151    Physical Examination  Filed Vitals:   05/11/12 1333  BP: 125/79  Pulse: 82  Resp: 16    Body mass index is 43.26 kg/(m^2).  General:  WDWN in NAD Gait: Normal HEENT: WNL Eyes: Pupils equal Pulmonary: normal non-labored breathing , without Rales, rhonchi,  wheezing Cardiac: RRR, without  Murmurs, rubs or gallops; No carotid bruits Abdomen: soft, NT, no masses Skin: no rashes, ulcers noted Vascular Exam/Pulses: Palpable lower extremity pulses bilaterally, 3+ radial pulses bilaterally  Extremities without ischemic changes, no Gangrene , no cellulitis; no open wounds;  Musculoskeletal: no muscle wasting or atrophy  Neurologic: A&O X 3; Appropriate Affect ; SENSATION: normal; MOTOR FUNCTION:  moving all extremities equally. Speech is fluent/normal  Non-Invasive Vascular Imaging: CTA of  the thoracic aneurysm shows a slight increase from 4.2 cm last exam 24.5 cm today CT of his stent graft repair shows a stable infrarenal bifurcated aortic stent graft with decrease in size of the native aneurysm sac and no endoleak. This was reviewed by Dr. Arbie Cookey who spoke with the patient.  ASSESSMENT/PLAN: Asymptomatic patient that will followup in one year with repeat CT A. of the chest and abdomen. The patient will followup with Dr. Arbie Cookey after that exam. The patient's questions were encouraged and answered, she is in agreement with this plan.  Lauree Chandler ANP  Clinic M.D.: Early

## 2012-05-12 NOTE — Addendum Note (Signed)
Addended by: Sharee Pimple on: 05/12/2012 11:44 AM   Modules accepted: Orders

## 2012-05-18 ENCOUNTER — Ambulatory Visit: Payer: Medicare Other | Admitting: Internal Medicine

## 2012-05-21 ENCOUNTER — Telehealth: Payer: Self-pay

## 2012-05-21 MED ORDER — LOSARTAN POTASSIUM 50 MG PO TABS: 50.0000 mg | ORAL_TABLET | Freq: Every day | ORAL | Status: DC

## 2012-05-21 NOTE — Telephone Encounter (Signed)
Patient called no answer.Left message on personal voice mail received refill request for losartan,refill sent to [facility] in [place].Also due for appointment with Dr.Jordan call office to make appointment.

## 2012-05-27 ENCOUNTER — Encounter: Payer: Self-pay | Admitting: Nurse Practitioner

## 2012-05-27 ENCOUNTER — Ambulatory Visit (INDEPENDENT_AMBULATORY_CARE_PROVIDER_SITE_OTHER): Payer: Medicare Other | Admitting: Nurse Practitioner

## 2012-05-27 VITALS — BP 108/76 | HR 64 | Ht 62.0 in | Wt 233.1 lb

## 2012-05-27 DIAGNOSIS — I4892 Unspecified atrial flutter: Secondary | ICD-10-CM | POA: Diagnosis not present

## 2012-05-27 NOTE — Patient Instructions (Addendum)
Your lung problems include pulmonary hypertension and emphysema  Stay on your current medicines  Avoid chocolate  I would encourage you to try your CPAP again  See Dr. [place] in 6 months  We will check an EKG today  Call the [facility] Care office at [phone] if you have any questions, problems or concerns.

## 2012-05-27 NOTE — Progress Notes (Addendum)
[name] Date of Birth: Nov 08, 1940 Medical Record #[id number]  History of Present Illness: [name] is seen back today for a follow up visit. She is seen for [name]. She has dyspnea with past normal nuclear and echo. She does have moderate pulmonary HTN felt to be related to her chronic pulmonary issues. She is a former smoker with COPD and OSA with hx of lung cancer with XRT and chemo. Has had prior DVT. She has a history of paroxysmal atrial flutter, intolerant to Coumadin and has stopped her Xarelto. Does have esophageal dysmotility. Her other issues are as noted below. It has been assumed that coumadin just does not work for her. She has stopped Xarelto in the past.   She comes in today. She is here alone. She was last here in May and saw [name]. It was felt that her DOE was more related to chronic pulmonary HTN, obesity, sleep apnea and severe deconditioning with no clearcut cardiac etiology.  Was in the hospital back in August with possible DVT which turned out to be negative after a repeat study. Her metoprolol had to be cut back at that time due to bradycardia. She remains on her CCB therapy. She tells me that she does have palpitations. Especially at night. It seems to be triggered by eating chocolate which she likes very much. More short of breath as well and this seems to be her most limiting factor. She remains on oxygen but admits that she does not use it all the time. Not using her CPAP. Has a normal EF. Chronic swelling. Was actually here to get a med refilled but she says [name] took care of it. Overall, she seems to be at her baseline.   Current Outpatient Prescriptions on File Prior to Visit  Medication Sig Dispense Refill  . albuterol (PROVENTIL HFA;VENTOLIN HFA) 108 (90 BASE) MCG/ACT inhaler Inhale 2 puffs into the lungs every 6 (six) hours as needed for wheezing or shortness of breath.  1 Inhaler  prn  . aspirin 81 MG EC tablet Take 81 mg by mouth daily.       .  Calcium Carbonate-Vitamin D (CALCIUM + D PO) Take 2 tablets by mouth daily.        . Cholecalciferol (VITAMIN D) 1000 UNITS capsule Take 1,000 Units by mouth daily.        . citalopram (CELEXA) 20 MG tablet Take 20 mg by mouth daily.       [name] (CARDIZEM CD) 120 MG 24 hr capsule Take 120 mg by mouth daily.      . fish oil-omega-3 fatty acids 1000 MG capsule Take 1 g by mouth daily.       . furosemide (LASIX) 40 MG tablet Take 1 tablet (40 mg total) by mouth 2 (two) times daily.  60 tablet  6  . levothyroxine (SYNTHROID, LEVOTHROID) 88 MCG tablet Take 88 mcg by mouth daily.      Bonnie Kitchen losartan (COZAAR) 50 MG tablet Take 1 tablet (50 mg total) by mouth daily.  30 tablet  1  . morphine (MSIR) 15 MG tablet Take 15 mg by mouth every 4 (four) hours as needed. pain      . potassium chloride SA (K-DUR,KLOR-CON) 20 MEQ tablet Take 1 tablet (20 mEq total) by mouth 2 (two) times daily.  60 tablet  6  . pravastatin (PRAVACHOL) 20 MG tablet Take 20 mg by mouth daily.        . temazepam (RESTORIL)  15 MG capsule Take 15 mg by mouth at bedtime as needed. sleep      . tiotropium (SPIRIVA HANDIHALER) 18 MCG inhalation capsule Place 1 capsule (18 mcg total) into inhaler and inhale daily.  30 capsule  3  . losartan (COZAAR) 50 MG tablet Take 1 tablet (50 mg total) by mouth daily.  90 tablet  3    Allergies  Allergen Reactions  . Amoxicillin Other (See Comments)    Funny feeling    Past Medical History  Diagnosis Date  . COPD (chronic obstructive pulmonary disease)   . H/O: lung cancer     reported in remission  . DVT (deep venous thrombosis) 2004    BLE  . AAA (abdominal aortic aneurysm)   . Hypothyroidism   . Hyperlipidemia   . History of uterine fibroid   . Hiatal hernia   . History of fibrocystic disease of breast   . HTN (hypertension)   . Anxiety and depression   . PAD (peripheral artery disease)   . Ovarian mass     right benign  . Anticoagulant long-term use     Failed on Coumadin. On  Xarelto  . Atrial fib/flutter, transient June 2012  . Normal nuclear stress test 2012    May 2012  . Esophageal dysmotility   . History of bronchitis   . Exertional dyspnea   . OSA (obstructive sleep apnea)     "haven't been using my CPAP lately" (01/21/12)  . OA (osteoarthritis)     "knees; left shoulder"  . Chronic lower back pain   . Anxiety   . Small cell carcinoma of lung 2004    NON-SMALL CELL CARCINOMA OF THE LUNG, METASTATIC TO THE SUPRACLAVICULAR AND MEDIASTINAL LYMPH NODES; in remission    Past Surgical History  Procedure Date  . Abdominal aortic aneurysm repair ~ 2010    stent graft  . Replacement total knee ~ 2008    left  . Thyroidectomy ~ 1964  . Bladder surgery ~ 2003    sling  . Cardiovascular stress test 2012    No ischemia  . Breast biopsy 1960's    both breast's - benign  . Cataract extraction w/ intraocular lens  implant, bilateral ~ 2009    History  Smoking status  . Former Smoker -- 1.0 packs/day for 40 years  . Types: Cigarettes  . Quit date: 06/23/2001  Smokeless tobacco  . Never Used    History  Alcohol Use No    Family History  Problem Relation Age of Onset  . Heart disease Father   . Heart attack Father   . Diabetes type II Mother   . Heart disease Brother   . Heart disease Sister     See's Dr. Elease Ball    Review of Systems: The review of systems is per the HPI.  All other systems were reviewed and are negative.  Physical Exam: BP 108/76  Pulse 64  Ht 5\' 2"  (1.575 m)  Wt 233 lb 1.9 oz (105.743 kg)  BMI 42.64 kg/m2 Patient is very pleasant and in no acute distress. She does appear chronically ill. She is using a walker. She is obese. Skin is warm and dry. Color is normal.  HEENT is unremarkable. Normocephalic/atraumatic. PERRL. Sclera are nonicteric. Neck is supple. No masses. No JVD. Lungs are clear with decreased breath sounds. Cardiac exam shows a regular rate and rhythm. Occasional ectopic noted.  Abdomen is obese but soft.  Extremities are quite full with chronic edema.  Gait and ROM are intact but she does have to use a walker. No gross neurologic deficits noted.   LABORATORY DATA: EKG today shows sinus rhythm with a PVC  Lab Results  Component Value Date   WBC 5.9 01/23/2012   HGB 14.3 01/23/2012   HCT 41.6 01/23/2012   PLT 143* 01/23/2012   GLUCOSE 75 01/29/2012   ALT 16 01/21/2012   AST 24 01/21/2012   NA 135 01/29/2012   K 4.6 01/29/2012   CL 100 01/29/2012   CREATININE 0.81 05/07/2012   BUN 15 05/07/2012   CO2 31 01/29/2012   TSH 2.446 01/21/2012   INR 1.00 01/22/2012   Assessment / Plan: 1. Chronic dyspnea - felt to be multifactorial with her COPD/lung cancer history/pulmonary HTN - on oxygen. I encouraged her to continue wearing and try to get back on CPAP.  2. Paroxysmal atrial flutter - checking EKG today - intolerant to coumadin and no longer on Xarelto due to cost issues. EKG shows sinus today. I have asked her to cut out the chocolate.   3. Obesity  I think from our standpoint she is holding her own. She is mostly limited by her lung issues. Does not need medicines refilled by [place] today. Remains in sinus. Will see her back in 6 months.   Patient is agreeable to this plan and will call if any problems develop in the interim.

## 2012-06-07 ENCOUNTER — Encounter: Payer: Self-pay | Admitting: Internal Medicine

## 2012-06-07 ENCOUNTER — Ambulatory Visit (INDEPENDENT_AMBULATORY_CARE_PROVIDER_SITE_OTHER): Payer: Medicare Other | Admitting: Internal Medicine

## 2012-06-07 VITALS — BP 118/72 | HR 68 | Ht 62.0 in | Wt 238.4 lb

## 2012-06-07 DIAGNOSIS — R141 Gas pain: Secondary | ICD-10-CM | POA: Diagnosis not present

## 2012-06-07 DIAGNOSIS — R142 Eructation: Secondary | ICD-10-CM

## 2012-06-07 DIAGNOSIS — R1310 Dysphagia, unspecified: Secondary | ICD-10-CM

## 2012-06-07 DIAGNOSIS — R143 Flatulence: Secondary | ICD-10-CM | POA: Diagnosis not present

## 2012-06-07 MED ORDER — ALIGN PO CAPS: 1.0000 | ORAL_CAPSULE | Freq: Every day | ORAL | Status: DC

## 2012-06-07 NOTE — Patient Instructions (Addendum)
We have given you samples of Align. This puts good bacteria back into your colon. You should take 1 capsule by mouth once daily. If this works well for you, it can be purchased over the counter.  Please follow up as needed 

## 2012-06-07 NOTE — Progress Notes (Signed)
HISTORY OF PRESENT ILLNESS:  [name] is a 71 y.o. female with MULTIPLE SIGNIFICANT medical problems including, but not limited to, adenocarcinoma of the lung status post chemotherapy and radiation therapy, oxygen-dependent COPD, chronic atrial fibrillation, hypertension, hyperlipidemia, abdominal aortic aneurysm, and anxiety. I saw the patient in May 2008 for screening colonoscopy. She was found to have pan diverticulosis and internal hemorrhoids, but no polyps. She is sent today regarding a previous history of dysphagia. She underwent a modified barium swallow with speech pathology in November of 2012. No significant abnormalities. Possible esophageal cause postulated. GI evaluation recommended. Patient tells me that she has a phlegm like sensation in the back of her throat. No true esophageal dysphagia that she will have trouble with pills. She notices this in the posterior pharyngeal region. No coughing or choking. No active reflux symptoms. She does report problems with bloating and increased intestinal gas as well as a hunger sensation which is relieved with meals. No lower GI complaints or bleeding. No weight loss.  REVIEW OF SYSTEMS:  All non-GI ROS negative except for shortness of breath, fatigue, anxiety, depression, joint aches  Past Medical History  Diagnosis Date  . COPD (chronic obstructive pulmonary disease)   . H/O: lung cancer     reported in remission  . DVT (deep venous thrombosis) 2004    BLE  . AAA (abdominal aortic aneurysm)   . Hypothyroidism   . Hyperlipidemia   . History of uterine fibroid   . Hiatal hernia   . History of fibrocystic disease of breast   . HTN (hypertension)   . Anxiety and depression   . PAD (peripheral artery disease)   . Ovarian mass     right benign  . Anticoagulant long-term use     Failed on Coumadin. On Xarelto  . Atrial fib/flutter, transient June 2012  . Normal nuclear stress test 2012    May 2012  . Esophageal dysmotility   .  History of bronchitis   . Exertional dyspnea   . OSA (obstructive sleep apnea)     "haven't been using my CPAP lately" (01/21/12)  . OA (osteoarthritis)     "knees; left shoulder"  . Chronic lower back pain   . Anxiety   . Small cell carcinoma of lung 2004    NON-SMALL CELL CARCINOMA OF THE LUNG, METASTATIC TO THE SUPRACLAVICULAR AND MEDIASTINAL LYMPH NODES; in remission  . Ovarian mass     benign, right  . A-fib     Past Surgical History  Procedure Date  . Abdominal aortic aneurysm repair ~ 2010    stent graft  . Replacement total knee ~ 2008    left  . Thyroidectomy ~ 1964  . Bladder surgery ~ 2003    sling  . Cardiovascular stress test 2012    No ischemia  . Breast biopsy 1960's    both breast's - benign  . Cataract extraction w/ intraocular lens  implant, bilateral ~ 2009    Social History [name]  reports that she quit smoking about 10 years ago. Her smoking use included Cigarettes. She has a 40 pack-year smoking history. She has never used smokeless tobacco. She reports that she does not drink alcohol or use illicit drugs.  family history includes Diabetes type II in her mother; Heart attack in her father; and Heart disease in her brother, father, and sister.  Allergies  Allergen Reactions  . Amoxicillin Other (See Comments)    Funny feeling  PHYSICAL EXAMINATION: Vital signs: BP 118/72  Pulse 68  Ht 5\' 2"  (1.575 m)  Wt 238 lb 6.4 oz (108.138 kg)  BMI 43.60 kg/m2  Constitutional: obese, chronically ill-appearing, no acute distress. Nasal cannula oxygen place. Moves with assistance only Psychiatric: alert and oriented x3, cooperative Eyes: extraocular movements intact, anicteric, conjunctiva pink Mouth: oral pharynx moist, no lesions Neck: supple no lymphadenopathy Cardiovascular: heart regular rate and rhythm, no murmur Lungs: clear to auscultation bilaterally Abdomen: soft,these, nontender, nondistended, no obvious ascites, no peritoneal signs,  normal bowel sounds, no organomegaly Extremities: no lower extremity edema bilaterally Skin: no lesions on visible extremities Neuro: No focal deficits.   ASSESSMENT:  #1. Vague pharyngeal dysphagia. No worrisome features. Prior modified barium swallow as discussed. I would leave well enough alone. I would not pursue endoscopy given her significant comorbidities #2. Increased intestinal gas #3. Diverticulosis on prior colonoscopy #4. MULTIPLE SIGNIFICANT medical problems  PLAN:  #1. Empiric trial of PPI. Can try Prilosec OTC 20 mg daily #2. Literature on intestinal gas #3. Trial of probiotic Align one daily #4. GI followup as needed

## 2012-07-07 ENCOUNTER — Telehealth: Payer: Self-pay | Admitting: Internal Medicine

## 2012-07-07 MED ORDER — TIOTROPIUM BROMIDE MONOHYDRATE 18 MCG IN CAPS: 18.0000 ug | ORAL_CAPSULE | Freq: Every day | RESPIRATORY_TRACT | Status: DC

## 2012-07-07 NOTE — Telephone Encounter (Signed)
RX has been sent to the pharmacy.

## 2012-08-10 ENCOUNTER — Telehealth: Payer: Self-pay | Admitting: Internal Medicine

## 2012-08-10 MED ORDER — TEMAZEPAM 15 MG PO CAPS: 15.0000 mg | ORAL_CAPSULE | Freq: Every evening | ORAL | Status: DC | PRN

## 2012-08-10 MED ORDER — ALBUTEROL SULFATE HFA 108 (90 BASE) MCG/ACT IN AERS: 2.0000 | INHALATION_SPRAY | Freq: Four times a day (QID) | RESPIRATORY_TRACT | Status: DC | PRN

## 2012-08-10 MED ORDER — TIOTROPIUM BROMIDE MONOHYDRATE 18 MCG IN CAPS: 18.0000 ug | ORAL_CAPSULE | Freq: Every day | RESPIRATORY_TRACT | Status: DC

## 2012-08-10 NOTE — Telephone Encounter (Signed)
Rxs printed and faxed to [facility] for the pt

## 2012-08-10 NOTE — Telephone Encounter (Signed)
Ok to refill each for 90 days, ref x 1 year.  The temazepam will probably only work if given 90 and call us each time for refills.

## 2012-08-10 NOTE — Telephone Encounter (Signed)
I spoke with pt. She stated she is switching pharmacies to [facility]. She is needing 90 day supply of albuterol, spiriva, and temazepam 15 mg. Temazepam last refilled 01/12/12 #30 x 5 refills. Please advise if okay to refill for 90 days and how may refills thanks Dr. Maple Hudson Last OV 04/12/12 Pending OV 10/11/12

## 2012-08-11 NOTE — Telephone Encounter (Signed)
Pt aware. Nothing further was needed 

## 2012-08-11 NOTE — Telephone Encounter (Signed)
Pt returned call.  She can be reached @ [phone]. Leanora Ivanoff

## 2012-10-11 ENCOUNTER — Ambulatory Visit (INDEPENDENT_AMBULATORY_CARE_PROVIDER_SITE_OTHER): Payer: Medicare Other | Admitting: Internal Medicine

## 2012-10-11 ENCOUNTER — Encounter: Payer: Self-pay | Admitting: Internal Medicine

## 2012-10-11 VITALS — BP 126/76 | HR 58 | Ht 62.0 in | Wt 238.4 lb

## 2012-10-11 DIAGNOSIS — G4733 Obstructive sleep apnea (adult) (pediatric): Secondary | ICD-10-CM | POA: Diagnosis not present

## 2012-10-11 DIAGNOSIS — E042 Nontoxic multinodular goiter: Secondary | ICD-10-CM | POA: Diagnosis not present

## 2012-10-11 DIAGNOSIS — J449 Chronic obstructive pulmonary disease, unspecified: Secondary | ICD-10-CM | POA: Diagnosis not present

## 2012-10-11 DIAGNOSIS — Z85118 Personal history of other malignant neoplasm of bronchus and lung: Secondary | ICD-10-CM | POA: Diagnosis not present

## 2012-10-11 NOTE — Patient Instructions (Addendum)
Continue O2 at 2L/ Advanced  Please call as needed  Ask Dr Jacky Kindle about the thyroid nodules noted on CT chest July, 2013

## 2012-10-11 NOTE — Progress Notes (Signed)
05/19/11- 49 yoF former smoker followed for COPD and OSA with hx Lung CA/xrt/chemo, DVT, AFlutter LOV- 04/16/10 Has had flu vaccine. Reports coughing up a streak of blood about 2 weeks ago. Chest has been tight, not much cough-mostly with meals. She thinks swallowing triggers her cough. More short of breath than usual. Some reflux. No chest pain or palpitation fever, sweat or enlarged glands. Using Combivent at least once most days has made her feel better. Blows nose occasionally with some blood or green. Denies headache. Continues CPAP at 12 CWP/Advanced/O2 2 L per minute for sleep Had CT scan of chest 05/06/2011 to followup of her aortic aneurysm. Showed emphysema, stable aneurysm, enlarged right lobe of thyroid, coronary disease. Images reviewed.  01/20/12- 66 yoF former smoker followed for COPD and OSA with hx Lung CA/xrt/chemo, DVT, AFlutter Increased SOB x 2 months and getting worse; unable to do many activities Dry cough treated w/ cough drops; denies fever, sweat, chest pain.  Feet and legs stay swollen. + Hx DVT, on ASA- eval by Dr Jacky Kindle, Dr early. Cardiology has told her "heart is fine'. Dr Darnelle Catalan has told her no recurrence of lung Ca. She stopped CPAP after bad cold last winter (Advanced). Still sleeping with O2 2L/M  01/29/12-  71 yoF former smoker followed for COPD and OSA with hx Lung CA/xrt/chemo, DVT, AFlutter Post hospital 01/21/12-01/23/12 decompensated OHS/ OSA with fluid overload. Started on home oxygen. Trying CPAP again using nasal pillows. temazepam helps. Has a rescue Combivent but also uses Spiriva sore address this. She very rarely needs her rescue inhaler but we are going  to switch to albuterol Leg vein dopplers 01/21/12-- Neg, although we initially got a telephone report of superficial phlebitis which is when we sent her to the hospital for evaluation. CTa- 01/21/12-  IMPRESSION:  No evidence for pulmonary embolism.  Small right pleural effusion.  Diffuse emphysematous  disease and subtle parenchymal densities in  the lungs. Findings could represent atelectasis or mild edema.  Few areas of volume loss but no focal consolidation.  Stable saccular aneurysm involving descending thoracic aorta.  Stable nodules in the lower neck may be related to thyroid nodules.  Original Report Authenticated By: Richarda Overlie, M.D.   04/12/12- 59 yoF former smoker followed for COPD and OSA with hx Lung CA/xrt/chemo, DVT, AFlutter Had flu vaccine. Not using CPAP at all. Continues oxygen 2 L/Advanced. Rarely needs albuterol rescue inhaler. Asks about portable oxygen concentrator. No longer on Coumadin for history of DVT. Just using aspirin. COPD assessment test (CAT) score 22/40  10/11/12- 71 yoF former smoker followed for COPD and OSA with hx Lung CA/xrt/chemo, DVT, AFlutter FOLLOWS FOR: Increased SOB with allergies flaring up She says she "got tired of bothering with oxygen" and left it in the car. By the time she got to her exam room today saturation was 83% on room air. infrequent use of rescue inhaler. Advanced O2 2L Not using CPAP. CT chest 05/11/12 ( for Dr Arbie Cookey) IMPRESSION:  1. Slight increase in size of a 4.5cm saccular aneurysm from the  mid descending thoracic aorta.  2. Bilateral thyroid lesions. Consider further evaluation with  thyroid ultrasound. If patient is clinically hyperthyroid,  consider nuclear medicine thyroid uptake and scan.  ROS-see HPI Constitutional:   No-   weight loss, night sweats, fevers, chills, fatigue, lassitude. HEENT:   No-  headaches, difficulty swallowing, tooth/dental problems, sore throat,       No-  sneezing, itching, ear ache, nasal congestion, post nasal drip,  CV:  No-   chest pain, orthopnea, PND, +less swelling in lower extremities, No-anasarca, dizziness,  +Occasional palpitations Resp:    +shortness of breath with exertion or at rest.              No- productive cough,  +non-productive cough,  no-coughing up of blood.               No-   change in color of mucus.  No- wheezing.   Skin: No-   rash or lesions. GI:  No-   heartburn, indigestion, abdominal pain, nausea, vomiting,  GU: N MS:  No-   joint pain or swelling.   Neuro-     nothing unusual Psych:  No- change in mood or affect. No depression or anxiety.  No memory loss.  OBJ- Physical Exam General- Alert, Oriented, Affect-appropriate, Distress- looks strained, obese. O2 2L/ Advanced Skin- rash-none, lesions- none, excoriation- none Lymphadenopathy- none Head- atraumatic            Eyes- Gross vision intact, PERRLA, conjunctivae and secretions clear            Ears- Hearing, canals-normal            Nose- Clear, no-Septal dev, mucus, polyps, erosion, perforation             Throat- Mallampati II , mucosa clear , drainage- none, tonsils- atrophic Neck- flexible , trachea midline, no stridor , thyroid nl, carotid no bruit Chest - symmetrical excursion , unlabored           Heart/CV- IRR ~60 w/ skips, no murmur , no gallop  , no rub, nl s1 s2                           - JVD- none , + trace edema, + stasis changes, varices- none           Lung- +basilar crackles, wheeze- none, cough- none , dullness-none, rub- none           Chest wall-  Abd-  Br/ Gen/ Rectal- Not done, not indicated Extrem- cyanosis- none, clubbing, none, atrophy- none, strength- nl Neuro- grossly intact to observation

## 2012-10-17 DIAGNOSIS — E042 Nontoxic multinodular goiter: Secondary | ICD-10-CM | POA: Insufficient documentation

## 2012-10-17 NOTE — Assessment & Plan Note (Signed)
Desaturation with exertion demonstrates need for portable oxygen and we discussed use during sleep and with exertion.

## 2012-10-17 NOTE — Assessment & Plan Note (Signed)
Failed CPAP. She is using oxygen. Weight loss encouraged.

## 2012-10-17 NOTE — Assessment & Plan Note (Addendum)
This is directed to the attention of her primary physician. The patient isn't sure whether he knows about.

## 2012-10-17 NOTE — Assessment & Plan Note (Addendum)
No recurrence indicated as a chest CT 05/11/2012

## 2012-10-26 ENCOUNTER — Other Ambulatory Visit: Payer: Self-pay

## 2012-10-26 DIAGNOSIS — Z1231 Encounter for screening mammogram for malignant neoplasm of breast: Secondary | ICD-10-CM

## 2012-11-12 DIAGNOSIS — E785 Hyperlipidemia, unspecified: Secondary | ICD-10-CM | POA: Diagnosis not present

## 2012-11-12 DIAGNOSIS — E039 Hypothyroidism, unspecified: Secondary | ICD-10-CM | POA: Diagnosis not present

## 2012-11-12 DIAGNOSIS — R82998 Other abnormal findings in urine: Secondary | ICD-10-CM | POA: Diagnosis not present

## 2012-11-12 DIAGNOSIS — I10 Essential (primary) hypertension: Secondary | ICD-10-CM | POA: Diagnosis not present

## 2012-11-19 DIAGNOSIS — E669 Obesity, unspecified: Secondary | ICD-10-CM | POA: Diagnosis not present

## 2012-11-19 DIAGNOSIS — F341 Dysthymic disorder: Secondary | ICD-10-CM | POA: Diagnosis not present

## 2012-11-19 DIAGNOSIS — I4891 Unspecified atrial fibrillation: Secondary | ICD-10-CM | POA: Diagnosis not present

## 2012-11-19 DIAGNOSIS — E039 Hypothyroidism, unspecified: Secondary | ICD-10-CM | POA: Diagnosis not present

## 2012-11-19 DIAGNOSIS — Z0000 Encounter for general adult medical examination without abnormal findings: Secondary | ICD-10-CM | POA: Diagnosis not present

## 2012-11-19 DIAGNOSIS — J449 Chronic obstructive pulmonary disease, unspecified: Secondary | ICD-10-CM | POA: Diagnosis not present

## 2012-11-19 DIAGNOSIS — E785 Hyperlipidemia, unspecified: Secondary | ICD-10-CM | POA: Diagnosis not present

## 2012-11-19 DIAGNOSIS — I10 Essential (primary) hypertension: Secondary | ICD-10-CM | POA: Diagnosis not present

## 2012-11-19 DIAGNOSIS — Z1331 Encounter for screening for depression: Secondary | ICD-10-CM | POA: Diagnosis not present

## 2012-11-24 DIAGNOSIS — R3915 Urgency of urination: Secondary | ICD-10-CM | POA: Diagnosis not present

## 2012-12-01 ENCOUNTER — Ambulatory Visit (INDEPENDENT_AMBULATORY_CARE_PROVIDER_SITE_OTHER): Payer: Medicare Other | Admitting: Cardiology

## 2012-12-01 ENCOUNTER — Encounter: Payer: Self-pay | Admitting: Cardiology

## 2012-12-01 VITALS — BP 110/60 | HR 83 | Ht 62.0 in | Wt 240.0 lb

## 2012-12-01 DIAGNOSIS — I5030 Unspecified diastolic (congestive) heart failure: Secondary | ICD-10-CM

## 2012-12-01 DIAGNOSIS — I4891 Unspecified atrial fibrillation: Secondary | ICD-10-CM | POA: Diagnosis not present

## 2012-12-01 MED ORDER — FUROSEMIDE 40 MG PO TABS: 40.0000 mg | ORAL_TABLET | Freq: Two times a day (BID) | ORAL | Status: DC

## 2012-12-01 MED ORDER — DILTIAZEM HCL ER COATED BEADS 120 MG PO CP24: 120.0000 mg | ORAL_CAPSULE | Freq: Every day | ORAL | Status: DC

## 2012-12-01 MED ORDER — METOPROLOL TARTRATE 25 MG PO TABS: 12.5000 mg | ORAL_TABLET | Freq: Two times a day (BID) | ORAL | Status: DC

## 2012-12-01 MED ORDER — RIVAROXABAN 20 MG PO TABS: 20.0000 mg | ORAL_TABLET | Freq: Every day | ORAL | Status: DC

## 2012-12-01 MED ORDER — POTASSIUM CHLORIDE CRYS ER 20 MEQ PO TBCR: 20.0000 meq | EXTENDED_RELEASE_TABLET | Freq: Two times a day (BID) | ORAL | Status: DC

## 2012-12-01 NOTE — Progress Notes (Signed)
[name] Date of Birth: Nov 19, 1940 Medical Record #[id number]  History of Present Illness: [name] is seen back today for a follow up visit.  She has dyspnea with past normal nuclear and echo. She does have moderate pulmonary HTN felt to be related to her chronic COPD. She is a former smoker with COPD and OSA with hx of lung cancer with XRT and chemo. Has had prior DVT. She has a history of paroxysmal atrial flutter. And she reports that Coumadin in the past wasn't effective because she had a DVT while on therapy. She was placed on Xarelto but this was too expensive.   During her last hospitalization metoprolol was discontinued because of bradycardia. She reports that her breathing is not doing so well. She is on home oxygen therapy. She has been noticing more frequent episodes of rapid heartbeat particularly when she lies down at night. She denies any dizziness or syncope. She is concerned about her risk of stroke especially since her sister died with a stroke.  Current Outpatient Prescriptions on File Prior to Visit  Medication Sig Dispense Refill  . albuterol (PROVENTIL HFA;VENTOLIN HFA) 108 (90 BASE) MCG/ACT inhaler Inhale 2 puffs into the lungs every 6 (six) hours as needed for wheezing or shortness of breath.  3 Inhaler  3  . aspirin 81 MG EC tablet Take 81 mg by mouth daily.       . Calcium Carbonate-Vitamin D (CALCIUM + D PO) Take 2 tablets by mouth daily. Vitamin D3      . Cholecalciferol (VITAMIN D) 1000 UNITS capsule Take 1,000 Units by mouth daily.        . citalopram (CELEXA) 20 MG tablet Take 20 mg by mouth daily.       . fish oil-omega-3 fatty acids 1000 MG capsule Take 1 g by mouth daily.       Marland Kitchen levothyroxine (SYNTHROID, LEVOTHROID) 88 MCG tablet Take 88 mcg by mouth daily.      Marland Kitchen losartan (COZAAR) 50 MG tablet Take 1 tablet (50 mg total) by mouth daily.  30 tablet  1  . morphine (MSIR) 15 MG tablet Take 15 mg by mouth every 4 (four) hours as needed. pain      . NON  FORMULARY daily. 2 liters oxygen      . pravastatin (PRAVACHOL) 20 MG tablet Take 20 mg by mouth daily.        . temazepam (RESTORIL) 15 MG capsule Take 1 capsule (15 mg total) by mouth at bedtime as needed. sleep  90 capsule  0  . tiotropium (SPIRIVA HANDIHALER) 18 MCG inhalation capsule Place 1 capsule (18 mcg total) into inhaler and inhale daily.  90 capsule  3   No current facility-administered medications on file prior to visit.    Allergies  Allergen Reactions  . Amoxicillin Other (See Comments)    Funny feeling    Past Medical History  Diagnosis Date  . COPD (chronic obstructive pulmonary disease)   . H/O: lung cancer     reported in remission  . DVT (deep venous thrombosis) 2004    BLE  . AAA (abdominal aortic aneurysm)   . Hypothyroidism   . Hyperlipidemia   . History of uterine fibroid   . Hiatal hernia   . History of fibrocystic disease of breast   . HTN (hypertension)   . Anxiety and depression   . PAD (peripheral artery disease)   . Ovarian mass     right benign  .  Anticoagulant long-term use     Failed on Coumadin. On Xarelto  . Atrial fib/flutter, transient June 2012  . Normal nuclear stress test 2012    May 2012  . Esophageal dysmotility   . History of bronchitis   . Exertional dyspnea   . OSA (obstructive sleep apnea)     "haven't been using my CPAP lately" (01/21/12)  . OA (osteoarthritis)     "knees; left shoulder"  . Chronic lower back pain   . Anxiety   . Small cell carcinoma of lung 2004    NON-SMALL CELL CARCINOMA OF THE LUNG, METASTATIC TO THE SUPRACLAVICULAR AND MEDIASTINAL LYMPH NODES; in remission  . Ovarian mass     benign, right  . A-fib     Past Surgical History  Procedure Laterality Date  . Abdominal aortic aneurysm repair  ~ 2010    stent graft  . Replacement total knee  ~ 2008    left  . Thyroidectomy  ~ 1964  . Bladder surgery  ~ 2003    sling  . Cardiovascular stress test  2012    No ischemia  . Breast biopsy  1960's     both breast's - benign  . Cataract extraction w/ intraocular lens  implant, bilateral  ~ 2009    History  Smoking status  . Former Smoker -- 1.00 packs/day for 40 years  . Types: Cigarettes  . Quit date: 06/23/2001  Smokeless tobacco  . Never Used    History  Alcohol Use No    Family History  Problem Relation Age of Onset  . Heart disease Father   . Heart attack Father   . Diabetes type II Mother   . Heart disease Brother   . Heart disease Sister     See's Dr. Elease Ball    Review of Systems: The review of systems is per the HPI. She reports symptoms of diffuse bodyaches. She has been off of her statin for the past 2 weeks with improvement in the symptoms. All other systems were reviewed and are negative.  Physical Exam: BP 110/60  Pulse 83  Ht 5\' 2"  (1.575 m)  Wt 240 lb (108.863 kg)  BMI 43.89 kg/m2  SpO2 95% I repeated her pulse rate and it was 120 and irregular. Patient is very pleasant and in no acute distress. She does appear chronically ill. She is using a walker. She is obese. Skin is warm and dry. Color is normal.  HEENT is unremarkable. Normocephalic/atraumatic. PERRL. Sclera are nonicteric. Neck is supple. No masses. No JVD. Lungs are clear with decreased breath sounds. Cardiac exam shows an irregular rate and rhythm. Abdomen is obese but soft. Extremities are quite full with chronic edema. Gait and ROM are intact but she does have to use a walker. No gross neurologic deficits noted.   LABORATORY DATA:  Laboratory data from 11/12/2012 reveals a normal chemistry panel. CBC was normal. Cholesterol was 143, triglycerides 83, HDL 36, LDL 90. Thyroid function studies were normal. Urinalysis was negative.  Assessment / Plan: 1. Chronic dyspnea - felt to be multifactorial with her COPD/lung cancer history/pulmonary HTN - on oxygen.  2. Paroxysmal atrial flutter/ fibrillation - patient is clearly in atrial fibrillation today with an increased ventricular response. I  recommended resumption of metoprolol at 12.5 mg twice a day. She will continue her current dose of diltiazem. I have recommended anticoagulation and she is going to reprice Xarelto with [facility]. I will followup again in 3 months.  3. Obesity

## 2012-12-01 NOTE — Patient Instructions (Signed)
We will add metoprolol 12.5 mg twice a day.  If you can afford it we will start Xarelto 20 mg daily and stop taking ASA.  Let me know if it is too expensive.

## 2012-12-02 DIAGNOSIS — H43819 Vitreous degeneration, unspecified eye: Secondary | ICD-10-CM | POA: Diagnosis not present

## 2012-12-02 DIAGNOSIS — H01009 Unspecified blepharitis unspecified eye, unspecified eyelid: Secondary | ICD-10-CM | POA: Diagnosis not present

## 2012-12-06 ENCOUNTER — Telehealth: Payer: Self-pay | Admitting: Cardiology

## 2012-12-06 MED ORDER — RIVAROXABAN 20 MG PO TABS: 20.0000 mg | ORAL_TABLET | Freq: Every day | ORAL | Status: DC

## 2012-12-06 NOTE — Telephone Encounter (Signed)
New Problem:    Patient called in because [facility] called her stating that they needed $300 to process her request for Rivaroxaban (XARELTO) 20 MG TABS.  Patient states that this is too expensive and would like to see if there were any other alternatives for this medication.  Please call back.

## 2012-12-06 NOTE — Telephone Encounter (Signed)
Returned call to patient she stated she wanted me to send 30 day xarelto refill to [facility].Stated she would call back if too expensive.

## 2012-12-22 DIAGNOSIS — S139XXA Sprain of joints and ligaments of unspecified parts of neck, initial encounter: Secondary | ICD-10-CM | POA: Diagnosis not present

## 2012-12-22 DIAGNOSIS — Z1212 Encounter for screening for malignant neoplasm of rectum: Secondary | ICD-10-CM | POA: Diagnosis not present

## 2012-12-22 DIAGNOSIS — I10 Essential (primary) hypertension: Secondary | ICD-10-CM | POA: Diagnosis not present

## 2012-12-27 DIAGNOSIS — M5030 Other cervical disc degeneration, unspecified cervical region: Secondary | ICD-10-CM | POA: Diagnosis not present

## 2012-12-27 DIAGNOSIS — M542 Cervicalgia: Secondary | ICD-10-CM | POA: Diagnosis not present

## 2012-12-27 DIAGNOSIS — G894 Chronic pain syndrome: Secondary | ICD-10-CM | POA: Diagnosis not present

## 2012-12-28 ENCOUNTER — Ambulatory Visit
Admission: RE | Admit: 2012-12-28 | Discharge: 2012-12-28 | Disposition: A | Discharge status: Home / Self Care | Payer: Medicare Other | Source: Ambulatory Visit

## 2012-12-28 ENCOUNTER — Ambulatory Visit: Payer: Medicare Other

## 2012-12-28 DIAGNOSIS — Z1231 Encounter for screening mammogram for malignant neoplasm of breast: Secondary | ICD-10-CM | POA: Diagnosis not present

## 2013-01-10 ENCOUNTER — Telehealth: Payer: Self-pay | Admitting: Cardiology

## 2013-01-10 DIAGNOSIS — M542 Cervicalgia: Secondary | ICD-10-CM | POA: Diagnosis not present

## 2013-01-10 DIAGNOSIS — M5030 Other cervical disc degeneration, unspecified cervical region: Secondary | ICD-10-CM | POA: Diagnosis not present

## 2013-01-10 NOTE — Telephone Encounter (Signed)
New Prob     Pt states she needs an alternative for XARELTO due to expensive cost. Also, pt requesting some samples of XARELTO.

## 2013-01-10 NOTE — Telephone Encounter (Signed)
Refills placed out front and patient made aware and also informed her of the Xarelto Rx card to help her with cost.  Patient verbalized understanding and expressed gratitude

## 2013-02-15 ENCOUNTER — Telehealth: Payer: Self-pay | Admitting: Internal Medicine

## 2013-02-15 MED ORDER — TEMAZEPAM 15 MG PO CAPS: 15.0000 mg | ORAL_CAPSULE | Freq: Every evening | ORAL | Status: DC | PRN

## 2013-02-15 NOTE — Telephone Encounter (Signed)
Refill called in and pt is aware. Carron Curie, CMA

## 2013-02-15 NOTE — Telephone Encounter (Signed)
Per CY okay to refill.  

## 2013-02-15 NOTE — Telephone Encounter (Signed)
Please advise if okay to send refill for temazepam 15 mg # 90  Pt last seen 10/11/12 Next ov 04/11/13 Last rx sent 08/10/12 # 90 with no refills Thanks!

## 2013-03-07 ENCOUNTER — Ambulatory Visit: Payer: Medicare Other | Admitting: Cardiology

## 2013-03-21 DIAGNOSIS — Z1289 Encounter for screening for malignant neoplasm of other sites: Secondary | ICD-10-CM | POA: Diagnosis not present

## 2013-03-21 DIAGNOSIS — R350 Frequency of micturition: Secondary | ICD-10-CM | POA: Diagnosis not present

## 2013-03-22 ENCOUNTER — Encounter: Payer: Self-pay | Admitting: Nurse Practitioner

## 2013-03-22 ENCOUNTER — Ambulatory Visit (INDEPENDENT_AMBULATORY_CARE_PROVIDER_SITE_OTHER): Payer: Medicare Other | Admitting: Nurse Practitioner

## 2013-03-22 VITALS — BP 132/68 | HR 52 | Ht 62.0 in | Wt 249.8 lb

## 2013-03-22 DIAGNOSIS — I4891 Unspecified atrial fibrillation: Secondary | ICD-10-CM | POA: Diagnosis not present

## 2013-03-22 LAB — BASIC METABOLIC PANEL
BUN: 12 mg/dL (ref 6–23)
CO2: 32 mEq/L (ref 19–32)
Calcium: 9.2 mg/dL (ref 8.4–10.5)
Chloride: 102 mEq/L (ref 96–112)
Creatinine, Ser: 0.8 mg/dL (ref 0.4–1.2)
GFR: 70.4 mL/min (ref >60.00)
Glucose, Bld: 87 mg/dL (ref 70–99)
Potassium: 4.3 mEq/L (ref 3.5–5.1)
Sodium: 137 mEq/L (ref 135–145)

## 2013-03-22 LAB — CBC WITH DIFFERENTIAL/PLATELET
Basophils Absolute: 0 10*3/uL (ref 0.0–0.1)
Basophils Relative: 0.3 % (ref 0.0–3.0)
Eosinophils Absolute: 0.1 10*3/uL (ref 0.0–0.7)
Eosinophils Relative: 2.5 % (ref 0.0–5.0)
HCT: 41.1 % (ref 36.0–46.0)
Hemoglobin: 14.1 g/dL (ref 12.0–15.0)
Lymphocytes Relative: 28.6 % (ref 12.0–46.0)
Lymphs Abs: 1.7 10*3/uL (ref 0.7–4.0)
MCHC: 34.3 g/dL (ref 30.0–36.0)
MCV: 103.2 fl — ABNORMAL HIGH (ref 78.0–100.0)
Monocytes Absolute: 0.6 10*3/uL (ref 0.1–1.0)
Monocytes Relative: 10 % (ref 3.0–12.0)
Neutro Abs: 3.4 10*3/uL (ref 1.4–7.7)
Neutrophils Relative %: 58.6 % (ref 43.0–77.0)
Platelets: 162 10*3/uL (ref 150.0–400.0)
RBC: 3.98 Mil/uL (ref 3.87–5.11)
RDW: 14.2 % (ref 11.5–14.6)
WBC: 5.8 10*3/uL (ref 4.5–10.5)

## 2013-03-22 MED ORDER — FUROSEMIDE 40 MG PO TABS: ORAL_TABLET | ORAL | Status: DC

## 2013-03-22 NOTE — Patient Instructions (Addendum)
Go back to the Lasix full tablet two times a day  Stay on your current medicines.   I am going to check some labs today  See Dr. Swaziland in 6 months.  Call the [facility] office at [phone] if you have any questions, problems or concerns.

## 2013-03-22 NOTE — Progress Notes (Signed)
[name] Date of Birth: 1941/04/13 Medical Record #[id number]  History of Present Illness: [name] is seen back today for a 3 month check. Seen for Dr. Swaziland. She has had dyspnea. Has had past normal nuclear and echo. Does have moderate pulmonary HTN related to chronic COPD. Former smoker. Has had lung cancer treated with XRT and chemo. Other issues include OSA, past DVT, paroxysmal atrial flutter. Had her DVT while on coumadin. Xarelto has been too expensive. No longer on beta blocker due to bradycardia.   Last seen back in June - seemed to be doing ok. Was in atrial fib during that visit. Low dose beta blocker was resumed and she remained on her anticoagulation.   Comes back today. Here alone. Doing ok. Has gotten more inactive. Weight is up 9 pounds. She has swelling in her feet and legs. She did cut her lasix back due to urinary frequency but realizes she is probably going to need to go back to her full dose. No chest pain. Breathing is about the same - still on oxygen - some days better than others. Not dizzy or lightheaded. No passing out. No palpitations since being back on some low dose beta blocker. Overall, she seems to be holding her own.   Current Outpatient Prescriptions  Medication Sig Dispense Refill  . albuterol (PROVENTIL HFA;VENTOLIN HFA) 108 (90 BASE) MCG/ACT inhaler Inhale 2 puffs into the lungs every 6 (six) hours as needed for wheezing or shortness of breath.  3 Inhaler  3  . Calcium Carbonate-Vitamin D (CALCIUM + D PO) Take 2 tablets by mouth daily. Vitamin D3      . Cholecalciferol (VITAMIN D) 1000 UNITS capsule Take 1,000 Units by mouth daily.        . citalopram (CELEXA) 20 MG tablet Take 20 mg by mouth daily.       Marland Kitchen diltiazem (CARDIZEM CD) 120 MG 24 hr capsule Take 1 capsule (120 mg total) by mouth daily.  90 capsule  3  . fish oil-omega-3 fatty acids 1000 MG capsule Take 1 g by mouth daily.       . furosemide (LASIX) 40 MG tablet Take 1 tablet (40 mg total) by mouth  2 (two) times daily.  180 tablet  3  . levothyroxine (SYNTHROID, LEVOTHROID) 88 MCG tablet Take 88 mcg by mouth daily.      Marland Kitchen losartan (COZAAR) 50 MG tablet Take 1 tablet (50 mg total) by mouth daily.  30 tablet  1  . metoprolol tartrate (LOPRESSOR) 25 MG tablet Take 0.5 tablets (12.5 mg total) by mouth 2 (two) times daily.  180 tablet  3  . morphine (MSIR) 15 MG tablet Take 15 mg by mouth every 4 (four) hours as needed. pain      . NON FORMULARY daily. 2 liters oxygen      . potassium chloride SA (K-DUR,KLOR-CON) 20 MEQ tablet Take 1 tablet (20 mEq total) by mouth 2 (two) times daily.  180 tablet  3  . pravastatin (PRAVACHOL) 20 MG tablet Take 20 mg by mouth daily.        . Rivaroxaban (XARELTO) 20 MG TABS Take 1 tablet (20 mg total) by mouth daily with supper.  30 tablet  11  . temazepam (RESTORIL) 15 MG capsule Take 1 capsule (15 mg total) by mouth at bedtime as needed. sleep  90 capsule  0  . tiotropium (SPIRIVA HANDIHALER) 18 MCG inhalation capsule Place 1 capsule (18 mcg total) into inhaler and inhale daily.  90 capsule  3   No current facility-administered medications for this visit.    Allergies  Allergen Reactions  . Amoxicillin Other (See Comments)    Funny feeling    Past Medical History  Diagnosis Date  . COPD (chronic obstructive pulmonary disease)   . H/O: lung cancer     reported in remission  . DVT (deep venous thrombosis) 2004    BLE  . AAA (abdominal aortic aneurysm)   . Hypothyroidism   . Hyperlipidemia   . History of uterine fibroid   . Hiatal hernia   . History of fibrocystic disease of breast   . HTN (hypertension)   . Anxiety and depression   . PAD (peripheral artery disease)   . Ovarian mass     right benign  . Anticoagulant long-term use     Failed on Coumadin. On Xarelto  . Atrial fib/flutter, transient June 2012  . Normal nuclear stress test 2012    May 2012  . Esophageal dysmotility   . History of bronchitis   . Exertional dyspnea   . OSA  (obstructive sleep apnea)     "haven't been using my CPAP lately" (01/21/12)  . OA (osteoarthritis)     "knees; left shoulder"  . Chronic lower back pain   . Anxiety   . Small cell carcinoma of lung 2004    NON-SMALL CELL CARCINOMA OF THE LUNG, METASTATIC TO THE SUPRACLAVICULAR AND MEDIASTINAL LYMPH NODES; in remission  . Ovarian mass     benign, right  . A-fib     Past Surgical History  Procedure Laterality Date  . Abdominal aortic aneurysm repair  ~ 2010    stent graft  . Replacement total knee  ~ 2008    left  . Thyroidectomy  ~ 1964  . Bladder surgery  ~ 2003    sling  . Cardiovascular stress test  2012    No ischemia  . Breast biopsy  1960's    both breast's - benign  . Cataract extraction w/ intraocular lens  implant, bilateral  ~ 2009    History  Smoking status  . Former Smoker -- 1.00 packs/day for 40 years  . Types: Cigarettes  . Quit date: 06/23/2001  Smokeless tobacco  . Never Used    History  Alcohol Use No    Family History  Problem Relation Age of Onset  . Heart disease Father   . Heart attack Father   . Diabetes type II Mother   . Heart disease Brother   . Heart disease Sister     See's Dr. Elease Hashimoto    Review of Systems: The review of systems is per the HPI.  All other systems were reviewed and are negative.  Physical Exam: BP 132/68  Pulse 52  Ht 5\' 2"  (1.575 m)  Wt 249 lb 12.8 oz (113.309 kg)  BMI 45.68 kg/m2 Patient is very pleasant and in no acute distress. Looks chronically ill. Weight is up 9 pounds. Skin is warm and dry. Color is normal.  HEENT is unremarkable. Normocephalic/atraumatic. PERRL. Sclera are nonicteric. Neck is supple. No masses. No JVD. Lungs are clear. Cardiac exam shows an irregular rhythm. Rate is ok. Abdomen is soft. Extremities are with 1+ edema. Gait and ROM are intact. No gross neurologic deficits noted.  LABORATORY DATA: PENDING  Lab Results  Component Value Date   WBC 5.9 01/23/2012   HGB 14.3 01/23/2012   HCT  41.6 01/23/2012   PLT 143* 01/23/2012   GLUCOSE  75 01/29/2012   ALT 16 01/21/2012   AST 24 01/21/2012   NA 135 01/29/2012   K 4.6 01/29/2012   CL 100 01/29/2012   CREATININE 0.81 05/07/2012   BUN 15 05/07/2012   CO2 31 01/29/2012   TSH 2.446 01/21/2012   INR 1.00 01/22/2012     Assessment / Plan: 1. Chronic dyspnea - multifactorial given her diastolic HF, COPD, prior lung cancer and pulmonary HTN - on oxygen - some swelling which I think is due to her cutting back her diuretics. Will get her back to 40 mg of Lasix BID. Check baseline labs today.   2. Atrial fib - rate is ok. On Xarelto - will check follow up labs.   3. Obesity - quite limited in her ability to exercise.   See Dr. Swaziland back in 6 months.   Patient is agreeable to this plan and will call if any problems develop in the interim.   Rosalio Macadamia, RN, ANP-C [facility] [address] [place], [place]  [address]

## 2013-03-23 DIAGNOSIS — Z23 Encounter for immunization: Secondary | ICD-10-CM | POA: Diagnosis not present

## 2013-04-11 ENCOUNTER — Ambulatory Visit: Payer: Medicare Other | Admitting: Internal Medicine

## 2013-04-18 ENCOUNTER — Telehealth: Payer: Self-pay | Admitting: Internal Medicine

## 2013-04-18 NOTE — Telephone Encounter (Signed)
Last OV 10/11/12  Pending OV 05/16/13  Allergies  Allergen Reactions  . Amoxicillin Other (See Comments)    Funny feeling   Current Outpatient Prescriptions on File Prior to Visit  Medication Sig Dispense Refill  . albuterol (PROVENTIL HFA;VENTOLIN HFA) 108 (90 BASE) MCG/ACT inhaler Inhale 2 puffs into the lungs every 6 (six) hours as needed for wheezing or shortness of breath.  3 Inhaler  3  . Calcium Carbonate-Vitamin D (CALCIUM + D PO) Take 2 tablets by mouth daily. Vitamin D3      . Cholecalciferol (VITAMIN D) 1000 UNITS capsule Take 1,000 Units by mouth daily.        . citalopram (CELEXA) 20 MG tablet Take 20 mg by mouth daily.       Marland Kitchen diltiazem (CARDIZEM CD) 120 MG 24 hr capsule Take 1 capsule (120 mg total) by mouth daily.  90 capsule  3  . fish oil-omega-3 fatty acids 1000 MG capsule Take 1 g by mouth daily.       . furosemide (LASIX) 40 MG tablet Taking one in the am and 1 in the pm  30 tablet    . levothyroxine (SYNTHROID, LEVOTHROID) 88 MCG tablet Take 88 mcg by mouth daily.      Marland Kitchen losartan (COZAAR) 50 MG tablet Take 1 tablet (50 mg total) by mouth daily.  30 tablet  1  . metoprolol tartrate (LOPRESSOR) 25 MG tablet Take 0.5 tablets (12.5 mg total) by mouth 2 (two) times daily.  180 tablet  3  . morphine (MSIR) 15 MG tablet Take 15 mg by mouth every 4 (four) hours as needed. pain      . NON FORMULARY daily. 2 liters oxygen      . potassium chloride SA (K-DUR,KLOR-CON) 20 MEQ tablet Take 1 tablet (20 mEq total) by mouth 2 (two) times daily.  180 tablet  3  . pravastatin (PRAVACHOL) 20 MG tablet Take 20 mg by mouth daily.        . Rivaroxaban (XARELTO) 20 MG TABS Take 1 tablet (20 mg total) by mouth daily with supper.  30 tablet  11  . temazepam (RESTORIL) 15 MG capsule Take 1 capsule (15 mg total) by mouth at bedtime as needed. sleep  90 capsule  0  . tiotropium (SPIRIVA HANDIHALER) 18 MCG inhalation capsule Place 1 capsule (18 mcg total) into inhaler and inhale daily.  90 capsule   3   No current facility-administered medications on file prior to visit.    CY - please advise on sample. Thanks.

## 2013-04-18 NOTE — Telephone Encounter (Signed)
lmomtcb x1 

## 2013-04-18 NOTE — Telephone Encounter (Signed)
Per CY-okay to give sample of Spiriva. Thanks.

## 2013-04-19 MED ORDER — TIOTROPIUM BROMIDE MONOHYDRATE 18 MCG IN CAPS: 18.0000 ug | ORAL_CAPSULE | Freq: Every day | RESPIRATORY_TRACT | Status: DC

## 2013-04-19 NOTE — Telephone Encounter (Signed)
Pt aware sample at front. Carron Curie, CMA

## 2013-04-25 DIAGNOSIS — H52209 Unspecified astigmatism, unspecified eye: Secondary | ICD-10-CM | POA: Diagnosis not present

## 2013-04-25 DIAGNOSIS — H2640 Unspecified secondary cataract: Secondary | ICD-10-CM | POA: Diagnosis not present

## 2013-04-25 DIAGNOSIS — Z961 Presence of intraocular lens: Secondary | ICD-10-CM | POA: Diagnosis not present

## 2013-05-05 ENCOUNTER — Telehealth: Payer: Self-pay | Admitting: Internal Medicine

## 2013-05-05 NOTE — Telephone Encounter (Signed)
I spoke with pt. She is requesting a sample of spiriva. This was giving to her 04/18/13. Please advise Dr. Maple Hudson thanks

## 2013-05-05 NOTE — Telephone Encounter (Signed)
May have 1 sample if available

## 2013-05-06 NOTE — Telephone Encounter (Signed)
Sample is up front for pick up. Pt is aware. 

## 2013-05-13 ENCOUNTER — Other Ambulatory Visit: Payer: Self-pay | Admitting: Vascular Surgery

## 2013-05-13 DIAGNOSIS — I714 Abdominal aortic aneurysm, without rupture: Secondary | ICD-10-CM | POA: Diagnosis not present

## 2013-05-13 LAB — BUN: BUN: 11 mg/dL (ref 6–23)

## 2013-05-13 LAB — CREATININE, SERUM: Creat: 0.82 mg/dL (ref 0.50–1.10)

## 2013-05-16 ENCOUNTER — Encounter: Payer: Self-pay | Admitting: Vascular Surgery

## 2013-05-16 ENCOUNTER — Ambulatory Visit (INDEPENDENT_AMBULATORY_CARE_PROVIDER_SITE_OTHER): Payer: Medicare Other | Admitting: Internal Medicine

## 2013-05-16 ENCOUNTER — Encounter: Payer: Self-pay | Admitting: Internal Medicine

## 2013-05-16 VITALS — BP 122/78 | HR 72 | Ht 62.0 in | Wt 248.6 lb

## 2013-05-16 DIAGNOSIS — C3490 Malignant neoplasm of unspecified part of unspecified bronchus or lung: Secondary | ICD-10-CM | POA: Diagnosis not present

## 2013-05-16 DIAGNOSIS — I739 Peripheral vascular disease, unspecified: Secondary | ICD-10-CM | POA: Diagnosis not present

## 2013-05-16 DIAGNOSIS — G4733 Obstructive sleep apnea (adult) (pediatric): Secondary | ICD-10-CM

## 2013-05-16 DIAGNOSIS — E039 Hypothyroidism, unspecified: Secondary | ICD-10-CM | POA: Diagnosis not present

## 2013-05-16 DIAGNOSIS — Z23 Encounter for immunization: Secondary | ICD-10-CM | POA: Diagnosis not present

## 2013-05-16 DIAGNOSIS — J4489 Other specified chronic obstructive pulmonary disease: Secondary | ICD-10-CM

## 2013-05-16 DIAGNOSIS — J449 Chronic obstructive pulmonary disease, unspecified: Secondary | ICD-10-CM | POA: Diagnosis not present

## 2013-05-16 DIAGNOSIS — I4891 Unspecified atrial fibrillation: Secondary | ICD-10-CM | POA: Diagnosis not present

## 2013-05-16 DIAGNOSIS — I10 Essential (primary) hypertension: Secondary | ICD-10-CM | POA: Diagnosis not present

## 2013-05-16 MED ORDER — TIOTROPIUM BROMIDE MONOHYDRATE 18 MCG IN CAPS: 18.0000 ug | ORAL_CAPSULE | Freq: Every day | RESPIRATORY_TRACT | Status: DC

## 2013-05-16 MED ORDER — ALBUTEROL SULFATE HFA 108 (90 BASE) MCG/ACT IN AERS: 2.0000 | INHALATION_SPRAY | Freq: Four times a day (QID) | RESPIRATORY_TRACT | Status: DC | PRN

## 2013-05-16 NOTE — Progress Notes (Signed)
05/19/11- 42 yoF former smoker followed for COPD and OSA with hx Lung CA/xrt/chemo, DVT, AFlutter LOV- 04/16/10 Has had flu vaccine. Reports coughing up a streak of blood about 2 weeks ago. Chest has been tight, not much cough-mostly with meals. She thinks swallowing triggers her cough. More short of breath than usual. Some reflux. No chest pain or palpitation fever, sweat or enlarged glands. Using Combivent at least once most days has made her feel better. Blows nose occasionally with some blood or green. Denies headache. Continues CPAP at 12 CWP/Advanced/O2 2 L per minute for sleep Had CT scan of chest 05/06/2011 to followup of her aortic aneurysm. Showed emphysema, stable aneurysm, enlarged right lobe of thyroid, coronary disease. Images reviewed.  01/20/12- 25 yoF former smoker followed for COPD and OSA with hx Lung CA/xrt/chemo, DVT, AFlutter Increased SOB x 2 months and getting worse; unable to do many activities Dry cough treated w/ cough drops; denies fever, sweat, chest pain.  Feet and legs stay swollen. + Hx DVT, on ASA- eval by Dr Jacky Kindle, Dr early. Cardiology has told her "heart is fine'. Dr Darnelle Catalan has told her no recurrence of lung Ca. She stopped CPAP after bad cold last winter (Advanced). Still sleeping with O2 2L/M  01/29/12-  71 yoF former smoker followed for COPD and OSA with hx Lung CA/xrt/chemo, DVT, AFlutter Post hospital 01/21/12-01/23/12 decompensated OHS/ OSA with fluid overload. Started on home oxygen. Trying CPAP again using nasal pillows. temazepam helps. Has a rescue Combivent but also uses Spiriva sore address this. She very rarely needs her rescue inhaler but we are going  to switch to albuterol Leg vein dopplers 01/21/12-- Neg, although we initially got a telephone report of superficial phlebitis which is when we sent her to the hospital for evaluation. CTa- 01/21/12-  IMPRESSION:  No evidence for pulmonary embolism.  Small right pleural effusion.  Diffuse emphysematous  disease and subtle parenchymal densities in  the lungs. Findings could represent atelectasis or mild edema.  Few areas of volume loss but no focal consolidation.  Stable saccular aneurysm involving descending thoracic aorta.  Stable nodules in the lower neck may be related to thyroid nodules.  Original Report Authenticated By: Richarda Overlie, M.D.   04/12/12- 51 yoF former smoker followed for COPD and OSA with hx Lung CA/xrt/chemo, DVT, AFlutter Had flu vaccine. Not using CPAP at all. Continues oxygen 2 L/Advanced. Rarely needs albuterol rescue inhaler. Asks about portable oxygen concentrator. No longer on Coumadin for history of DVT. Just using aspirin. COPD assessment test (CAT) score 22/40  10/11/12- 71 yoF former smoker followed for COPD and OSA with hx Lung CA/xrt/chemo, DVT, AFlutter FOLLOWS FOR: Increased SOB with allergies flaring up She says she "got tired of bothering with oxygen" and left it in the car. By the time she got to her exam room today saturation was 83% on room air. infrequent use of rescue inhaler. Advanced O2 2L Not using CPAP. CT chest 05/11/12 ( for Dr Arbie Cookey) IMPRESSION:  1. Slight increase in size of a 4.5cm saccular aneurysm from the  mid descending thoracic aorta.  2. Bilateral thyroid lesions. Consider further evaluation with  thyroid ultrasound. If patient is clinically hyperthyroid,  consider nuclear medicine thyroid uptake and scan.  05/16/13- 83 yoF former smoker followed for COPD and OSA with hx Lung CA/xrt/chemo, DVT, AFlutter Dyspnea on exertion. Continues to wear oxygen 2 L/Advanced. Mild hacking cough over the past 6 months. No phlegm. Denies chest pain, fever, sweat, adenopathy. Chest CT pending tomorrow  with Dr. Arbie Cookey.  ROS-see HPI Constitutional:   No-   weight loss, night sweats, fevers, chills, fatigue, lassitude. HEENT:   No-  headaches, difficulty swallowing, tooth/dental problems, sore throat,       No-  sneezing, itching, ear ache, nasal  congestion, post nasal drip,  CV:  No-   chest pain, orthopnea, PND, +less swelling in lower extremities, No-anasarca, dizziness,  +Occasional palpitations Resp:    +shortness of breath with exertion or at rest.              No- productive cough,  +non-productive cough,  no-coughing up of blood.              No-   change in color of mucus.  No- wheezing.   Skin: No-   rash or lesions. GI:  No-   heartburn, indigestion, abdominal pain, nausea, vomiting,  GU: N MS:  No-   joint pain or swelling.   Neuro-     nothing unusual Psych:  No- change in mood or affect. No depression or anxiety.  No memory loss.  OBJ- Physical Exam General- Alert, Oriented, Affect-appropriate, Distress- none, obese. O2 2L/ Advanced Skin- rash-none, lesions- none, excoriation- none Lymphadenopathy- none Head- atraumatic            Eyes- Gross vision intact, PERRLA, conjunctivae and secretions clear            Ears- Hearing, canals-normal            Nose- Clear, no-Septal dev, mucus, polyps, erosion, perforation             Throat- Mallampati II , mucosa clear , drainage- none, tonsils- atrophic, + dentures Neck- flexible , trachea midline, no stridor , thyroid nl, carotid no bruit Chest - symmetrical excursion , unlabored           Heart/CV- IRR ~60 w/ skips, no murmur , no gallop  , no rub, nl s1 s2                           - JVD- none , + trace edema, + stasis changes, varices- none           Lung- clear, wheeze- none, cough- none , dullness-none, rub- none           Chest wall-  Abd-  Br/ Gen/ Rectal- Not done, not indicated Extrem- cyanosis- none, clubbing, none, atrophy- none, strength- nl Neuro- grossly intact to observation

## 2013-05-16 NOTE — Patient Instructions (Addendum)
Script refill Proair printed  Sample x 2 Spiriva

## 2013-05-17 ENCOUNTER — Ambulatory Visit (INDEPENDENT_AMBULATORY_CARE_PROVIDER_SITE_OTHER): Payer: Medicare Other | Admitting: Vascular Surgery

## 2013-05-17 ENCOUNTER — Ambulatory Visit
Admission: RE | Admit: 2013-05-17 | Discharge: 2013-05-17 | Disposition: A | Discharge status: Home / Self Care | Payer: Medicare Other | Source: Ambulatory Visit | Attending: Neurosurgery | Admitting: Neurosurgery

## 2013-05-17 ENCOUNTER — Encounter: Payer: Self-pay | Admitting: Vascular Surgery

## 2013-05-17 VITALS — BP 126/73 | HR 77 | Resp 20 | Ht 62.0 in | Wt 248.0 lb

## 2013-05-17 DIAGNOSIS — I714 Abdominal aortic aneurysm, without rupture, unspecified: Secondary | ICD-10-CM

## 2013-05-17 DIAGNOSIS — Z48812 Encounter for surgical aftercare following surgery on the circulatory system: Secondary | ICD-10-CM | POA: Diagnosis not present

## 2013-05-17 MED ORDER — IOHEXOL 350 MG/ML SOLN
100.0000 mL | Freq: Once | INTRAVENOUS | Status: AC | PRN
  Administered 2013-05-17: 100 mL via INTRAVENOUS

## 2013-05-17 NOTE — Addendum Note (Signed)
Addended by: Dannielle Karvonen on: 05/17/2013 04:14 PM   Modules accepted: Orders

## 2013-05-17 NOTE — Progress Notes (Signed)
Patient is here today for followup of her stent graft repair of infrarenal abdominal aortic aneurysm and also followup of a saccular aneurysm in her proximal descending thoracic aorta. He also has a history of bilateral lower extremity swelling and is not able to tolerate compression garments. She does continue to get around independently with the use of a rolling walker. She uses oxygen as-needed basis.  Past Medical History  Diagnosis Date  . COPD (chronic obstructive pulmonary disease)   . H/O: lung cancer     reported in remission  . DVT (deep venous thrombosis) 2004    BLE  . AAA (abdominal aortic aneurysm)   . Hypothyroidism   . Hyperlipidemia   . History of uterine fibroid   . Hiatal hernia   . History of fibrocystic disease of breast   . HTN (hypertension)   . Anxiety and depression   . PAD (peripheral artery disease)   . Ovarian mass     right benign  . Anticoagulant long-term use     Failed on Coumadin. On Xarelto  . Atrial fib/flutter, transient June 2012  . Normal nuclear stress test 2012    May 2012  . Esophageal dysmotility   . History of bronchitis   . Exertional dyspnea   . OSA (obstructive sleep apnea)     "haven't been using my CPAP lately" (01/21/12)  . OA (osteoarthritis)     "knees; left shoulder"  . Chronic lower back pain   . Anxiety   . Small cell carcinoma of lung 2004    NON-SMALL CELL CARCINOMA OF THE LUNG, METASTATIC TO THE SUPRACLAVICULAR AND MEDIASTINAL LYMPH NODES; in remission  . Ovarian mass     benign, right  . A-fib     History  Substance Use Topics  . Smoking status: Former Smoker -- 1.00 packs/day for 40 years    Types: Cigarettes    Quit date: 06/23/2001  . Smokeless tobacco: Never Used  . Alcohol Use: No    Family History  Problem Relation Age of Onset  . Heart disease Father   . Heart attack Father   . Diabetes type II Mother   . Diabetes Mother   . Hypertension Mother   . Heart disease Brother   . Heart disease Sister      See's Dr. Elease Hashimoto    Allergies  Allergen Reactions  . Amoxicillin Other (See Comments)    Funny feeling    Current outpatient prescriptions:albuterol (PROVENTIL HFA;VENTOLIN HFA) 108 (90 BASE) MCG/ACT inhaler, Inhale 2 puffs into the lungs every 6 (six) hours as needed for wheezing or shortness of breath., Disp: 1 Inhaler, Rfl: prn;  atorvastatin (LIPITOR) 10 MG tablet, Take 1 tablet by mouth daily., Disp: , Rfl: ;  Calcium Carbonate-Vitamin D (CALCIUM + D PO), Take 2 tablets by mouth daily. Vitamin D3, Disp: , Rfl:  Cholecalciferol (VITAMIN D) 1000 UNITS capsule, Take 1,000 Units by mouth daily.  , Disp: , Rfl: ;  citalopram (CELEXA) 20 MG tablet, Take 20 mg by mouth daily. , Disp: , Rfl: ;  diltiazem (CARDIZEM CD) 120 MG 24 hr capsule, Take 1 capsule (120 mg total) by mouth daily., Disp: 90 capsule, Rfl: 3;  fish oil-omega-3 fatty acids 1000 MG capsule, Take 1 g by mouth daily. , Disp: , Rfl:  furosemide (LASIX) 40 MG tablet, Taking one in the am and 1 in the pm, Disp: 30 tablet, Rfl: ;  levothyroxine (SYNTHROID, LEVOTHROID) 88 MCG tablet, Take 88 mcg by mouth daily., Disp: ,  Rfl: ;  losartan (COZAAR) 50 MG tablet, Take 1 tablet (50 mg total) by mouth daily., Disp: 30 tablet, Rfl: 1;  metoprolol tartrate (LOPRESSOR) 25 MG tablet, Take 0.5 tablets (12.5 mg total) by mouth 2 (two) times daily., Disp: 180 tablet, Rfl: 3 morphine (MSIR) 15 MG tablet, Take 15 mg by mouth every 4 (four) hours as needed. pain, Disp: , Rfl: ;  NON FORMULARY, daily. 2 liters oxygen, Disp: , Rfl: ;  potassium chloride SA (K-DUR,KLOR-CON) 20 MEQ tablet, Take 1 tablet (20 mEq total) by mouth 2 (two) times daily., Disp: 180 tablet, Rfl: 3;  Rivaroxaban (XARELTO) 20 MG TABS, Take 1 tablet (20 mg total) by mouth daily with supper., Disp: 30 tablet, Rfl: 11 temazepam (RESTORIL) 15 MG capsule, Take 1 capsule (15 mg total) by mouth at bedtime as needed. sleep, Disp: 90 capsule, Rfl: 0;  tiotropium (SPIRIVA HANDIHALER) 18 MCG  inhalation capsule, Place 1 capsule (18 mcg total) into inhaler and inhale daily., Disp: 30 capsule, Rfl: 0;  tiotropium (SPIRIVA) 18 MCG inhalation capsule, Place 1 capsule (18 mcg total) into inhaler and inhale daily., Disp: 10 capsule, Rfl: 0  BP 126/73  Pulse 77  Resp 20  Ht 5\' 2"  (1.575 m)  Wt 248 lb (112.492 kg)  BMI 45.35 kg/m2  Body mass index is 45.35 kg/(m^2).       Physical exam: Alert oriented white female no acute distress. Moderate shortness of breath with exertion. Carotid arteries without bruits bilaterally Chest clear without wheezes bilaterally with equal respirations Abdomen obese no tenderness and no aneurysm noted Skin without ulcers or rashes  CT angiogram of her chest abdomen and pelvis was reviewed by myself. This does reveal no change in her saccular 4.2 cm descending thoracic aortic aneurysm. This is partially calcified. She has excellent positioning of her stent graft with no evidence of endoleak. Maximal diameter of her residual sac is 3.9 cm  Impression and plan: Stable stent graft repair of infrarenal abdominal aortic aneurysm and stable size of her saccular thoracic aneurysm. We'll continue her activities without limitations. We'll see [place] again in one year with repeat CT scan of her chest abdomen and pelvis

## 2013-05-31 NOTE — Assessment & Plan Note (Signed)
Dyspnea reflects her cardiopulmonary disease and obesity with deconditioning Plan-refill rescue inhaler. Sample Spiriva

## 2013-05-31 NOTE — Assessment & Plan Note (Signed)
Encouraged weight loss and sleep off flat of back.

## 2013-07-19 ENCOUNTER — Telehealth: Payer: Self-pay | Admitting: Internal Medicine

## 2013-07-19 MED ORDER — TEMAZEPAM 15 MG PO CAPS: 15.0000 mg | ORAL_CAPSULE | Freq: Every evening | ORAL | Status: DC | PRN

## 2013-07-19 NOTE — Telephone Encounter (Signed)
Last OV 05/16/13 Pending OV 11/28/13 Last fill 02/15/13 #90  CY - please advise on refill. Thanks.

## 2013-07-19 NOTE — Telephone Encounter (Signed)
Ok to refill 

## 2013-07-19 NOTE — Telephone Encounter (Signed)
Pt is aware that rx will be faxed to [facility].

## 2013-08-02 ENCOUNTER — Telehealth: Payer: Self-pay | Admitting: *Deleted

## 2013-08-02 NOTE — Telephone Encounter (Signed)
Patient called for xarelto samples. She is aware that they will be at the front desk for pick up.

## 2013-08-31 ENCOUNTER — Telehealth: Payer: Self-pay | Admitting: *Deleted

## 2013-08-31 NOTE — Telephone Encounter (Signed)
Patient requests xarelto samples. She is aware that they will be at the front desk for pick up.

## 2013-09-27 ENCOUNTER — Encounter: Payer: Self-pay | Admitting: Nurse Practitioner

## 2013-09-27 ENCOUNTER — Telehealth: Payer: Self-pay | Admitting: *Deleted

## 2013-09-27 ENCOUNTER — Ambulatory Visit (INDEPENDENT_AMBULATORY_CARE_PROVIDER_SITE_OTHER): Payer: Medicare Other | Admitting: Nurse Practitioner

## 2013-09-27 VITALS — BP 120/60 | HR 62 | Ht 62.0 in | Wt 248.0 lb

## 2013-09-27 DIAGNOSIS — I4891 Unspecified atrial fibrillation: Secondary | ICD-10-CM

## 2013-09-27 DIAGNOSIS — I5030 Unspecified diastolic (congestive) heart failure: Secondary | ICD-10-CM

## 2013-09-27 LAB — BASIC METABOLIC PANEL
BUN: 12 mg/dL (ref 6–23)
CO2: 30 mEq/L (ref 19–32)
Calcium: 9.3 mg/dL (ref 8.4–10.5)
Chloride: 101 mEq/L (ref 96–112)
Creatinine, Ser: 0.8 mg/dL (ref 0.4–1.2)
GFR: 72.29 mL/min (ref >60.00)
Glucose, Bld: 87 mg/dL (ref 70–99)
Potassium: 4.4 mEq/L (ref 3.5–5.1)
Sodium: 137 mEq/L (ref 135–145)

## 2013-09-27 LAB — CBC
HCT: 44 % (ref 36.0–46.0)
Hemoglobin: 15 g/dL (ref 12.0–15.0)
MCHC: 34 g/dL (ref 30.0–36.0)
MCV: 104.5 fl — ABNORMAL HIGH (ref 78.0–100.0)
Platelets: 171 10*3/uL (ref 150.0–400.0)
RBC: 4.2 Mil/uL (ref 3.87–5.11)
RDW: 14.5 % (ref 11.5–14.6)
WBC: 6 10*3/uL (ref 4.5–10.5)

## 2013-09-27 LAB — TSH: TSH: 0.58 u[IU]/mL (ref 0.35–5.50)

## 2013-09-27 NOTE — Progress Notes (Signed)
[name] Date of Birth: Mar 06, 1941 Medical Record #[id number]  History of Present Illness: [name] is seen back today for a 7 month check. Seen for Dr. [place]. She has had dyspnea. Has had past normal nuclear and echo. Does have moderate pulmonary HTN related to chronic COPD. Former smoker. Has had lung cancer treated with XRT and chemo. Other issues include OSA, past DVT, paroxysmal atrial flutter. Had her DVT while on coumadin. Xarelto has been too expensive. Has had issues with beta blocker and bradycardia in the past - but tolerates low dose.   Seen in June of 2014 and noted to be back in atrial fib. Remained on anticoagulation and beta blocker was added at low dose.   Last seen back in September - had gotten more inactive. Seemed to be stable.   Comes back today. Here alone. Continues to be on oxygen. Having trouble getting her portable oxygen. Still trying to take care of her 55 year old mother. More fatigued over the past 2 weeks and hasn't felt as well. Breathing a little worse - feels like the pollen is making it worse. She is tired. Having trouble affording Xarelto - had DVT on Coumadin. Weight unchanged. Some swelling which is chronic.    Current Outpatient Prescriptions  Medication Sig Dispense Refill  . albuterol (PROVENTIL HFA;VENTOLIN HFA) 108 (90 BASE) MCG/ACT inhaler Inhale 2 puffs into the lungs every 6 (six) hours as needed for wheezing or shortness of breath.  1 Inhaler  prn  . atorvastatin (LIPITOR) 10 MG tablet Take 1 tablet by mouth daily.      . Calcium Carbonate-Vitamin D (CALCIUM + D PO) Take 2 tablets by mouth daily. Vitamin D3      . citalopram (CELEXA) 20 MG tablet Take 20 mg by mouth daily.       Marland Kitchen diltiazem (CARDIZEM CD) 120 MG 24 hr capsule Take 1 capsule (120 mg total) by mouth daily.  90 capsule  3  . fish oil-omega-3 fatty acids 1000 MG capsule Take 1 g by mouth daily.       . furosemide (LASIX) 40 MG tablet Take 40 mg by mouth 2 (two) times daily. When  going out only takes 20 mg, one half tablet      . levothyroxine (SYNTHROID, LEVOTHROID) 88 MCG tablet Take 88 mcg by mouth daily.      Marland Kitchen losartan (COZAAR) 50 MG tablet Take 1 tablet (50 mg total) by mouth daily.  30 tablet  1  . metoprolol tartrate (LOPRESSOR) 25 MG tablet Take 0.5 tablets (12.5 mg total) by mouth 2 (two) times daily.  180 tablet  3  . morphine (MSIR) 15 MG tablet Take 15 mg by mouth every 4 (four) hours as needed. pain      . NON FORMULARY daily. 2 liters oxygen      . potassium chloride SA (K-DUR,KLOR-CON) 20 MEQ tablet Take 1 tablet (20 mEq total) by mouth 2 (two) times daily.  180 tablet  3  . temazepam (RESTORIL) 15 MG capsule Take 1 capsule (15 mg total) by mouth at bedtime as needed. sleep  90 capsule  0  . tiotropium (SPIRIVA) 18 MCG inhalation capsule Place 1 capsule (18 mcg total) into inhaler and inhale daily.  10 capsule  0  . Cholecalciferol (VITAMIN D) 1000 UNITS capsule Take 1,000 Units by mouth daily.        . Rivaroxaban (XARELTO) 20 MG TABS Take 1 tablet (20 mg total) by mouth daily with supper.  30 tablet  11   No current facility-administered medications for this visit.    Allergies  Allergen Reactions  . Amoxicillin Other (See Comments)    Funny feeling    Past Medical History  Diagnosis Date  . COPD (chronic obstructive pulmonary disease)   . H/O: lung cancer     reported in remission  . DVT (deep venous thrombosis) 2004    BLE  . AAA (abdominal aortic aneurysm)   . Hypothyroidism   . Hyperlipidemia   . History of uterine fibroid   . Hiatal hernia   . History of fibrocystic disease of breast   . HTN (hypertension)   . Anxiety and depression   . PAD (peripheral artery disease)   . Ovarian mass     right benign  . Anticoagulant long-term use     Failed on Coumadin. On Xarelto  . Atrial fib/flutter, transient June 2012  . Normal nuclear stress test 2012    May 2012  . Esophageal dysmotility   . History of bronchitis   . Exertional  dyspnea   . OSA (obstructive sleep apnea)     "haven't been using my CPAP lately" (01/21/12)  . OA (osteoarthritis)     "knees; left shoulder"  . Chronic lower back pain   . Anxiety   . Small cell carcinoma of lung 2004    NON-SMALL CELL CARCINOMA OF THE LUNG, METASTATIC TO THE SUPRACLAVICULAR AND MEDIASTINAL LYMPH NODES; in remission  . Ovarian mass     benign, right  . A-fib     Past Surgical History  Procedure Laterality Date  . Abdominal aortic aneurysm repair  ~ 2010    stent graft  . Replacement total knee  ~ 2008    left  . Thyroidectomy  ~ 1964  . Bladder surgery  ~ 2003    sling  . Cardiovascular stress test  2012    No ischemia  . Breast biopsy  1960's    both breast's - benign  . Cataract extraction w/ intraocular lens  implant, bilateral  ~ 2009    History  Smoking status  . Former Smoker -- 1.00 packs/day for 40 years  . Types: Cigarettes  . Quit date: 06/23/2001  Smokeless tobacco  . Never Used    History  Alcohol Use No    Family History  Problem Relation Age of Onset  . Heart disease Father   . Heart attack Father   . Diabetes type II Mother   . Diabetes Mother   . Hypertension Mother   . Heart disease Brother   . Heart disease Sister     See's Dr. Acie Fredrickson    Review of Systems: The review of systems is per the HPI.  All other systems were reviewed and are negative.  Physical Exam: BP 120/60  Pulse 62  Ht 5\' 2"  (1.575 m)  Wt 248 lb (112.492 kg)  BMI 45.35 kg/m2  SpO2 95% Oxygen sat initially down to 85% with walking into the office but recovered to 95% with deep breathing.  Patient is very pleasant and in no acute distress. She is obese. Skin is warm and dry. Color is normal.  HEENT is unremarkable. Normocephalic/atraumatic. PERRL. Sclera are nonicteric. Neck is supple. No masses. No JVD. Lungs are clear. Cardiac exam shows a regular rate and rhythm but then is quite irregular. Abdomen is soft. Extremities are with trace edema. Gait and  ROM are intact. Using a walker. No gross neurologic deficits noted.  Wt  Readings from Last 3 Encounters:  09/27/13 248 lb (112.492 kg)  05/17/13 248 lb (112.492 kg)  05/16/13 248 lb 9.6 oz (112.764 kg)     LABORATORY DATA: EKG pending Lab Results  Component Value Date   WBC 5.8 03/22/2013   HGB 14.1 03/22/2013   HCT 41.1 03/22/2013   PLT 162.0 03/22/2013   GLUCOSE 87 03/22/2013   ALT 16 01/21/2012   AST 24 01/21/2012   NA 137 03/22/2013   K 4.3 03/22/2013   CL 102 03/22/2013   CREATININE 0.82 05/13/2013   BUN 11 05/13/2013   CO2 32 03/22/2013   TSH 2.446 01/21/2012   INR 1.00 01/22/2012     Assessment / Plan: 1. Chronic dyspnea - multifactorial given her diastolic HF, COPD, prior lung cancer and pulmonary HTN - on oxygen - needs recheck of her labs today. Overall seems stable but overall situation remains tenuous at best.   2. PAF - EKG today shows her to be in sinus.   3. Obesity - quite limited in her ability to exercise.   We will recheck her labs today. Try to get her patient assistance with her Xarelto. She is planning on contacting pulmonary as well. See back in 6 months.   Patient is agreeable to this plan and will call if any problems develop in the interim.   Burtis Junes, RN, [facility] [address] [place], [place]  [address] [phone]

## 2013-09-27 NOTE — Telephone Encounter (Signed)
Patient assistance program through [place] and [place] for [place], requested by Truitt Merle, completed MD part of application and faxed to [employer], will await patient part of application and then fax that.

## 2013-09-27 NOTE — Patient Instructions (Signed)
We will check lab today  Fill out the patient assistance form for Xarelto and bring back here for "[name]" - will try to get you assistance with your Xarelto  Stay on your current medicines  I will see you in 6 months  Call the [place] office at [phone] if you have any questions, problems or concerns.

## 2013-09-28 ENCOUNTER — Telehealth: Payer: Self-pay | Admitting: Nurse Practitioner

## 2013-09-28 NOTE — Telephone Encounter (Signed)
New message     Pt want lab results

## 2013-11-07 ENCOUNTER — Telehealth: Payer: Self-pay | Admitting: *Deleted

## 2013-11-07 NOTE — Telephone Encounter (Signed)
Called Wynetta Emery and [place] regarding assistance with pts xarelto, they have MD information with script but haven't received patient application, will call and remail news forms to patient for completion.

## 2013-11-21 ENCOUNTER — Other Ambulatory Visit: Payer: Self-pay | Admitting: Internal Medicine

## 2013-11-21 DIAGNOSIS — E785 Hyperlipidemia, unspecified: Secondary | ICD-10-CM | POA: Diagnosis not present

## 2013-11-21 DIAGNOSIS — R809 Proteinuria, unspecified: Secondary | ICD-10-CM | POA: Diagnosis not present

## 2013-11-21 DIAGNOSIS — R82998 Other abnormal findings in urine: Secondary | ICD-10-CM | POA: Diagnosis not present

## 2013-11-21 DIAGNOSIS — E039 Hypothyroidism, unspecified: Secondary | ICD-10-CM | POA: Diagnosis not present

## 2013-11-21 DIAGNOSIS — I10 Essential (primary) hypertension: Secondary | ICD-10-CM | POA: Diagnosis not present

## 2013-11-28 ENCOUNTER — Encounter: Payer: Self-pay | Admitting: Internal Medicine

## 2013-11-28 ENCOUNTER — Encounter (INDEPENDENT_AMBULATORY_CARE_PROVIDER_SITE_OTHER): Payer: Self-pay

## 2013-11-28 ENCOUNTER — Ambulatory Visit (INDEPENDENT_AMBULATORY_CARE_PROVIDER_SITE_OTHER): Payer: Medicare Other | Admitting: Internal Medicine

## 2013-11-28 VITALS — BP 94/58 | HR 69 | Ht 62.0 in | Wt 248.6 lb

## 2013-11-28 DIAGNOSIS — J449 Chronic obstructive pulmonary disease, unspecified: Secondary | ICD-10-CM | POA: Diagnosis not present

## 2013-11-28 DIAGNOSIS — I739 Peripheral vascular disease, unspecified: Secondary | ICD-10-CM | POA: Diagnosis not present

## 2013-11-28 DIAGNOSIS — Z0000 Encounter for general adult medical examination without abnormal findings: Secondary | ICD-10-CM | POA: Diagnosis not present

## 2013-11-28 DIAGNOSIS — J439 Emphysema, unspecified: Secondary | ICD-10-CM

## 2013-11-28 DIAGNOSIS — G4700 Insomnia, unspecified: Secondary | ICD-10-CM

## 2013-11-28 DIAGNOSIS — I10 Essential (primary) hypertension: Secondary | ICD-10-CM | POA: Diagnosis not present

## 2013-11-28 DIAGNOSIS — E785 Hyperlipidemia, unspecified: Secondary | ICD-10-CM | POA: Diagnosis not present

## 2013-11-28 DIAGNOSIS — J438 Other emphysema: Secondary | ICD-10-CM

## 2013-11-28 DIAGNOSIS — I714 Abdominal aortic aneurysm, without rupture, unspecified: Secondary | ICD-10-CM | POA: Diagnosis not present

## 2013-11-28 DIAGNOSIS — E039 Hypothyroidism, unspecified: Secondary | ICD-10-CM | POA: Diagnosis not present

## 2013-11-28 DIAGNOSIS — I4891 Unspecified atrial fibrillation: Secondary | ICD-10-CM | POA: Diagnosis not present

## 2013-11-28 DIAGNOSIS — G4733 Obstructive sleep apnea (adult) (pediatric): Secondary | ICD-10-CM | POA: Diagnosis not present

## 2013-11-28 MED ORDER — UMECLIDINIUM-VILANTEROL 62.5-25 MCG/INH IN AEPB: 1.0000 | INHALATION_SPRAY | Freq: Every day | RESPIRATORY_TRACT | Status: DC

## 2013-11-28 MED ORDER — TEMAZEPAM 15 MG PO CAPS: 15.0000 mg | ORAL_CAPSULE | Freq: Every evening | ORAL | Status: DC | PRN

## 2013-11-28 NOTE — Patient Instructions (Signed)
Order- DME [facility]- Portable O2 concentrator for travel 2L dx COPD  Script for temazepam refill sent to [facility]  Sample x 2 Anoro    1 puff, once daily   Try this instead of Spiriva. Take your Spiriva with you to the beach, just in case you decide you prefer it

## 2013-11-28 NOTE — Progress Notes (Signed)
05/19/11- 49 yoF former smoker followed for COPD and OSA with hx Lung CA/xrt/chemo, DVT, AFlutter LOV- 04/16/10 Has had flu vaccine. Reports coughing up a streak of blood about 2 weeks ago. Chest has been tight, not much cough-mostly with meals. She thinks swallowing triggers her cough. More short of breath than usual. Some reflux. No chest pain or palpitation fever, sweat or enlarged glands. Using Combivent at least once most days has made her feel better. Blows nose occasionally with some blood or green. Denies headache. Continues CPAP at 12 CWP/Advanced/O2 2 L per minute for sleep Had CT scan of chest 05/06/2011 to followup of her aortic aneurysm. Showed emphysema, stable aneurysm, enlarged right lobe of thyroid, coronary disease. Images reviewed.  01/20/12- 72 yoF former smoker followed for COPD and OSA with hx Lung CA/xrt/chemo, DVT, AFlutter Increased SOB x 2 months and getting worse; unable to do many activities Dry cough treated w/ cough drops; denies fever, sweat, chest pain.  Feet and legs stay swollen. + Hx DVT, on ASA- eval by Dr Reynaldo Minium, Dr early. Cardiology has told her "heart is fine'. Dr Jana Hakim has told her no recurrence of lung Ca. She stopped CPAP after bad cold last winter (Advanced). Still sleeping with O2 2L/M  01/29/12-  71 yoF former smoker followed for COPD and OSA with hx Lung CA/xrt/chemo, DVT, AFlutter Post hospital 01/21/12-01/23/12 decompensated OHS/ OSA with fluid overload. Started on home oxygen. Trying CPAP again using nasal pillows. temazepam helps. Has a rescue Combivent but also uses Spiriva sore address this. She very rarely needs her rescue inhaler but we are going  to switch to albuterol Leg vein dopplers 01/21/12-- Neg, although we initially got a telephone report of superficial phlebitis which is when we sent her to the hospital for evaluation. CTa- 01/21/12-  IMPRESSION:  No evidence for pulmonary embolism.  Small right pleural effusion.  Diffuse emphysematous  disease and subtle parenchymal densities in  the lungs. Findings could represent atelectasis or mild edema.  Few areas of volume loss but no focal consolidation.  Stable saccular aneurysm involving descending thoracic aorta.  Stable nodules in the lower neck may be related to thyroid nodules.  Original Report Authenticated By: Markus Daft, M.D.   04/12/12- 89 yoF former smoker followed for COPD and OSA with hx Lung CA/xrt/chemo, DVT, AFlutter Had flu vaccine. Not using CPAP at all. Continues oxygen 2 L/Advanced. Rarely needs albuterol rescue inhaler. Asks about portable oxygen concentrator. No longer on Coumadin for history of DVT. Just using aspirin. COPD assessment test (CAT) score 22/40  10/11/12- 71 yoF former smoker followed for COPD and OSA with hx Lung CA/xrt/chemo, DVT, AFlutter FOLLOWS FOR: Increased SOB with allergies flaring up She says she "got tired of bothering with oxygen" and left it in the car. By the time she got to her exam room today saturation was 83% on room air. infrequent use of rescue inhaler. Advanced O2 2L Not using CPAP. CT chest 05/11/12 ( for Dr Donnetta Hutching) IMPRESSION:  1. Slight increase in size of a 4.5cm saccular aneurysm from the  mid descending thoracic aorta.  2. Bilateral thyroid lesions. Consider further evaluation with  thyroid ultrasound. If patient is clinically hyperthyroid,  consider nuclear medicine thyroid uptake and scan.  05/16/13- 30 yoF former smoker followed for COPD and OSA/ failed CPAP,  with hx Lung CA/xrt/chemo, DVT, AFlutter Dyspnea on exertion. Continues to wear oxygen 2 L/Advanced. Mild hacking cough over the past 6 months. No phlegm. Denies chest pain, fever, sweat, adenopathy. Chest  CT pending tomorrow with Dr. Donnetta Hutching. Spiriva helpful. Using pro-air once or twice daily now.  11/28/13 2 yoF former smoker followed for COPD and OSA with hx Lung CA/xrt/chemo, DVT, AFlutter/Fib FOLLOWS FOR: Pt states she is SOB with any abmbulation and with  little daily activty. C/o minimal dry cough. Denies CP/tightness.  Continues oxygen 2 L/Advanced. Quit CPAP long ago. No bleeding on Xarelto  ROS-see HPI Constitutional:   No-   weight loss, night sweats, fevers, chills, fatigue, lassitude. HEENT:   No-  headaches, difficulty swallowing, tooth/dental problems, sore throat,       No-  sneezing, itching, ear ache, nasal congestion, post nasal drip,  CV:  No-   chest pain, orthopnea, PND, +less swelling in lower extremities, No-anasarca, dizziness,                                 +Occasional palpitations Resp:    +shortness of breath with exertion or at rest.              No- productive cough,  +non-productive cough,  no-coughing up of blood.              No-   change in color of mucus.  No- wheezing.   Skin: No-   rash or lesions. GI:  No-   heartburn, indigestion, abdominal pain, nausea, vomiting,  GU: N MS:  No-   joint pain or swelling.   Neuro-     nothing unusual Psych:  No- change in mood or affect. No depression or anxiety.  No memory loss.  OBJ- Physical Exam General- Alert, Oriented, Affect-appropriate, Distress- none, obese. O2 2L/ Advanced Skin- rash-none, lesions- none, excoriation- none Lymphadenopathy- none Head- atraumatic            Eyes- Gross vision intact, PERRLA, conjunctivae and secretions clear            Ears- Hearing, canals-normal            Nose- Clear, no-Septal dev, mucus, polyps, erosion, perforation             Throat- Mallampati II , mucosa clear , drainage- none, tonsils- atrophic, + dentures Neck- flexible , trachea midline, no stridor , thyroid nl, carotid no bruit Chest - symmetrical excursion , unlabored           Heart/CV- IRR ~60 w/ skips, no murmur , no gallop  , no rub, nl s1 s2                           - JVD- none , + trace edema, + stasis changes, varices- none           Lung- clear, wheeze- none, cough- none , dullness-none, rub- none           Chest wall-  Abd-  Br/ Gen/ Rectal- Not done,  not indicated Extrem- cyanosis- none, clubbing, none, atrophy- none, strength- nl, Stasis changes with heavy                         legs Neuro- grossly intact to observation

## 2013-11-29 DIAGNOSIS — M545 Low back pain, unspecified: Secondary | ICD-10-CM | POA: Diagnosis not present

## 2013-11-29 DIAGNOSIS — M5090 Cervical disc disorder, unspecified, unspecified cervical region: Secondary | ICD-10-CM | POA: Diagnosis not present

## 2013-11-29 DIAGNOSIS — M25519 Pain in unspecified shoulder: Secondary | ICD-10-CM | POA: Diagnosis not present

## 2013-11-30 DIAGNOSIS — M25569 Pain in unspecified knee: Secondary | ICD-10-CM | POA: Diagnosis not present

## 2013-11-30 DIAGNOSIS — M129 Arthropathy, unspecified: Secondary | ICD-10-CM | POA: Diagnosis not present

## 2013-11-30 DIAGNOSIS — M1710 Unilateral primary osteoarthritis, unspecified knee: Secondary | ICD-10-CM | POA: Diagnosis not present

## 2013-12-01 DIAGNOSIS — Z1212 Encounter for screening for malignant neoplasm of rectum: Secondary | ICD-10-CM | POA: Diagnosis not present

## 2013-12-19 ENCOUNTER — Other Ambulatory Visit: Payer: Self-pay | Admitting: Cardiology

## 2014-01-10 DIAGNOSIS — M19019 Primary osteoarthritis, unspecified shoulder: Secondary | ICD-10-CM | POA: Diagnosis not present

## 2014-01-19 ENCOUNTER — Telehealth: Payer: Self-pay | Admitting: Internal Medicine

## 2014-01-19 MED ORDER — TIOTROPIUM BROMIDE MONOHYDRATE 18 MCG IN CAPS: ORAL_CAPSULE | RESPIRATORY_TRACT | Status: DC

## 2014-01-19 NOTE — Telephone Encounter (Signed)
Per OV 11/28/13; Order- DME [facility]- Portable O2 concentrator for travel 2L dx COPD Script for temazepam refill sent to [facility] Sample x 2 Anoro    1 puff, once daily   Try this instead of Spiriva. Take your Spiriva with you to the beach, just in case you decide you prefe   CALLED spoke with pt. She wants her spiriva called into [facility]. She reports the anoro did not work for her. This has been called in. Nothing further needed

## 2014-01-27 ENCOUNTER — Encounter: Payer: Self-pay | Admitting: Internal Medicine

## 2014-01-27 DIAGNOSIS — G4700 Insomnia, unspecified: Secondary | ICD-10-CM | POA: Insufficient documentation

## 2014-01-27 NOTE — Assessment & Plan Note (Signed)
Discussed, status unchanged

## 2014-01-27 NOTE — Assessment & Plan Note (Signed)
Plan-after discussion, okay refill temazepam

## 2014-01-27 NOTE — Assessment & Plan Note (Signed)
Remains chronically oxygen dependent. Dyspnea reflects lung disease, deconditioning and her heart Plan-try Anoro instead of Spiriva for comparison

## 2014-02-02 ENCOUNTER — Other Ambulatory Visit: Payer: Self-pay

## 2014-02-02 MED ORDER — METOPROLOL TARTRATE 25 MG PO TABS: 12.5000 mg | ORAL_TABLET | Freq: Two times a day (BID) | ORAL | Status: DC

## 2014-02-16 ENCOUNTER — Telehealth: Payer: Self-pay | Admitting: Nurse Practitioner

## 2014-02-16 NOTE — Telephone Encounter (Signed)
Pt stated that she is out of Xarelto 20mg  and would like some samples if there are any avalible, if not she will need a new prescription. Please call   Thanks

## 2014-02-16 NOTE — Telephone Encounter (Signed)
Returned call to patient samples of Xarelto 20 mg left at [facility] office front desk.

## 2014-02-17 ENCOUNTER — Other Ambulatory Visit: Payer: Self-pay

## 2014-02-17 DIAGNOSIS — Z1231 Encounter for screening mammogram for malignant neoplasm of breast: Secondary | ICD-10-CM

## 2014-03-03 ENCOUNTER — Ambulatory Visit: Payer: Medicare Other

## 2014-03-06 ENCOUNTER — Ambulatory Visit
Admission: RE | Admit: 2014-03-06 | Discharge: 2014-03-06 | Disposition: A | Discharge status: Home / Self Care | Payer: Medicare Other | Source: Ambulatory Visit

## 2014-03-06 DIAGNOSIS — Z1231 Encounter for screening mammogram for malignant neoplasm of breast: Secondary | ICD-10-CM | POA: Diagnosis not present

## 2014-03-22 DIAGNOSIS — Z23 Encounter for immunization: Secondary | ICD-10-CM | POA: Diagnosis not present

## 2014-03-29 ENCOUNTER — Ambulatory Visit: Payer: Medicare Other | Admitting: Nurse Practitioner

## 2014-04-13 ENCOUNTER — Ambulatory Visit (INDEPENDENT_AMBULATORY_CARE_PROVIDER_SITE_OTHER): Payer: Medicare Other | Admitting: Nurse Practitioner

## 2014-04-13 ENCOUNTER — Encounter: Payer: Self-pay | Admitting: Nurse Practitioner

## 2014-04-13 VITALS — BP 130/74 | HR 56 | Ht 62.0 in | Wt 245.4 lb

## 2014-04-13 DIAGNOSIS — I5030 Unspecified diastolic (congestive) heart failure: Secondary | ICD-10-CM

## 2014-04-13 DIAGNOSIS — I482 Chronic atrial fibrillation, unspecified: Secondary | ICD-10-CM

## 2014-04-13 NOTE — Patient Instructions (Signed)
Stay on your current medicines  See me in 6 months  Call the [place] office at [phone] if you have any questions, problems or concerns.

## 2014-04-13 NOTE — Progress Notes (Signed)
[name] Date of Birth: [birth year] Medical Record #[id number]  History of Present Illness: [name] is seen back today for a 6 month check. Seen for Dr. Martinique. She has chronic dyspnea. Has had past normal nuclear and echo. Does have moderate pulmonary HTN related to chronic COPD. Former smoker. Has had lung cancer treated with XRT and chemo. Other issues include OSA, past DVT, paroxysmal atrial flutter. Had her DVT while on coumadin. Xarelto has been too expensive. Has had issues with beta blocker and bradycardia in the past - but tolerates low dose.   Last seen back in April - was felt to be stable.   Comes back today. Here alone. Continues to be on oxygen. Her 75 year old mother died since she was last here. Breathing seems to be stable. Tries to wear her support stockings for her swelling. Mostly limited by back pain. Going to the beach later today. Labs checked by Dr. Reynaldo Minium. No chest pain. Chronic swelling of her legs/feet.  Current Outpatient Prescriptions  Medication Sig Dispense Refill  . albuterol (PROVENTIL HFA;VENTOLIN HFA) 108 (90 BASE) MCG/ACT inhaler Inhale 2 puffs into the lungs every 6 (six) hours as needed for wheezing or shortness of breath.  1 Inhaler  prn  . atorvastatin (LIPITOR) 10 MG tablet Take 1 tablet by mouth daily.      . Calcium Carbonate-Vitamin D (CALCIUM + D PO) Take 2 tablets by mouth daily. Vitamin D3      . CARTIA XT 120 MG 24 hr capsule TAKE 1 CAPSULE EVERY DAY  90 capsule  1  . Cholecalciferol (VITAMIN D) 1000 UNITS capsule Take 1,000 Units by mouth daily.        . citalopram (CELEXA) 20 MG tablet Take 20 mg by mouth daily.       . furosemide (LASIX) 40 MG tablet Take 40 mg by mouth daily. When going out only takes 20 mg, one half tablet      . levothyroxine (SYNTHROID, LEVOTHROID) 88 MCG tablet Take 88 mcg by mouth daily.      Marland Kitchen losartan (COZAAR) 50 MG tablet Take 1 tablet (50 mg total) by mouth daily.  30 tablet  1  . metoprolol tartrate (LOPRESSOR) 25  MG tablet Take 0.5 tablets (12.5 mg total) by mouth 2 (two) times daily.  180 tablet  3  . morphine (MSIR) 15 MG tablet Take 15 mg by mouth every 4 (four) hours as needed. pain      . NON FORMULARY daily. 2 liters oxygen      . Rivaroxaban (XARELTO) 20 MG TABS Take 1 tablet (20 mg total) by mouth daily with supper.  30 tablet  11  . temazepam (RESTORIL) 15 MG capsule Take 1 capsule (15 mg total) by mouth at bedtime as needed. sleep  90 capsule  0  . tiotropium (SPIRIVA HANDIHALER) 18 MCG inhalation capsule INHALE THE CONTENTS OF 1 CAPSULE USING HANDIHALER DEVICE ONCE DAILY AS DIRECTED  30 capsule  6  . potassium chloride SA (K-DUR,KLOR-CON) 20 MEQ tablet Take 1 tablet (20 mEq total) by mouth 2 (two) times daily.  180 tablet  3  . Umeclidinium-Vilanterol (ANORO ELLIPTA) 62.5-25 MCG/INH AEPB Inhale 1 puff into the lungs daily.  2 each  0   No current facility-administered medications for this visit.    Allergies  Allergen Reactions  . Amoxicillin Other (See Comments)    Funny feeling    Past Medical History  Diagnosis Date  . COPD (chronic obstructive pulmonary disease)   .  H/O: lung cancer     reported in remission  . DVT (deep venous thrombosis) 2004    BLE  . AAA (abdominal aortic aneurysm)   . Hypothyroidism   . Hyperlipidemia   . History of uterine fibroid   . Hiatal hernia   . History of fibrocystic disease of breast   . HTN (hypertension)   . Anxiety and depression   . PAD (peripheral artery disease)   . Ovarian mass     right benign  . Anticoagulant long-term use     Failed on Coumadin. On Xarelto  . Atrial fib/flutter, transient June 2012  . Normal nuclear stress test 2012    May 2012  . Esophageal dysmotility   . History of bronchitis   . Exertional dyspnea   . OSA (obstructive sleep apnea)     "haven't been using my CPAP lately" (01/21/12)  . OA (osteoarthritis)     "knees; left shoulder"  . Chronic lower back pain   . Anxiety   . Small cell carcinoma of lung  2004    NON-SMALL CELL CARCINOMA OF THE LUNG, METASTATIC TO THE SUPRACLAVICULAR AND MEDIASTINAL LYMPH NODES; in remission  . Ovarian mass     benign, right  . A-fib     Past Surgical History  Procedure Laterality Date  . Abdominal aortic aneurysm repair  ~ 2010    stent graft  . Replacement total knee  ~ 2008    left  . Thyroidectomy  ~ 1964  . Bladder surgery  ~ 2003    sling  . Cardiovascular stress test  2012    No ischemia  . Breast biopsy  1960's    both breast's - benign  . Cataract extraction w/ intraocular lens  implant, bilateral  ~ 2009    History  Smoking status  . Former Smoker -- 1.00 packs/day for 40 years  . Types: Cigarettes  . Quit date: 06/23/2001  Smokeless tobacco  . Never Used    History  Alcohol Use No    Family History  Problem Relation Age of Onset  . Heart disease Father   . Heart attack Father   . Diabetes type II Mother   . Diabetes Mother   . Hypertension Mother   . Heart disease Brother   . Heart disease Sister     See's Dr. Acie Fredrickson    Review of Systems: The review of systems is per the HPI.  All other systems were reviewed and are negative.  Physical Exam: BP 130/74  Pulse 56  Ht 5\' 2"  (1.575 m)  Wt 245 lb 6.4 oz (111.313 kg)  BMI 44.87 kg/m2  SpO2 94% Patient is very pleasant and in no acute distress. She is on oxygen. Weight is down 3 pounds - remains obese. Skin is warm and dry. Color is normal.  HEENT is unremarkable. Normocephalic/atraumatic. PERRL. Sclera are nonicteric. Neck is supple. No masses. No JVD. Lungs are clear. Cardiac exam shows a fairly regular rate and rhythm today. Abdomen is obese but soft. Extremities are quite full but with 1 to 2+ edema. Gait and ROM are intact. No gross neurologic deficits noted.  Wt Readings from Last 3 Encounters:  04/13/14 245 lb 6.4 oz (111.313 kg)  11/28/13 248 lb 9.6 oz (112.764 kg)  09/27/13 248 lb (112.492 kg)    LABORATORY DATA/PROCEDURES:  Lab Results  Component Value  Date   WBC 6.0 09/27/2013   HGB 15.0 09/27/2013   HCT 44.0 09/27/2013   PLT  171.0 09/27/2013   GLUCOSE 87 09/27/2013   ALT 16 01/21/2012   AST 24 01/21/2012   NA 137 09/27/2013   K 4.4 09/27/2013   CL 101 09/27/2013   CREATININE 0.8 09/27/2013   BUN 12 09/27/2013   CO2 30 09/27/2013   TSH 0.58 09/27/2013   INR 1.00 01/22/2012    BNP (last 3 results) No results found for this basename: PROBNP,  in the last 8760 hours   Assessment / Plan: 1. Chronic dyspnea - multifactorial given her diastolic HF, COPD, prior lung cancer and pulmonary HTN - on oxygen. Overall seems stable from our standpoint.   2. PAF - she is in sinus by exam today. Remains on her Xarelto - papers filled out to try and help offset the cost.  3. Obesity - quite limited in her ability to exercise due to chronic back pain, dyspnea, etc.   Will see her in 6 months. No change in her current regimen.   Patient is agreeable to this plan and will call if any problems develop in the interim.   Burtis Junes, RN, [facility] [address] [place], [place]  [address] [phone]

## 2014-04-21 ENCOUNTER — Telehealth: Payer: Self-pay

## 2014-04-21 DIAGNOSIS — G894 Chronic pain syndrome: Secondary | ICD-10-CM | POA: Diagnosis not present

## 2014-04-21 DIAGNOSIS — M5136 Other intervertebral disc degeneration, lumbar region: Secondary | ICD-10-CM | POA: Diagnosis not present

## 2014-04-21 DIAGNOSIS — M5092 Cervical disc disorder, unspecified, mid-cervical region: Secondary | ICD-10-CM | POA: Diagnosis not present

## 2014-04-21 NOTE — Telephone Encounter (Signed)
PATIENT called to get samples of xarelto I had only 2 bottles and I gave her a copay card

## 2014-05-01 DIAGNOSIS — H01001 Unspecified blepharitis right upper eyelid: Secondary | ICD-10-CM | POA: Diagnosis not present

## 2014-05-01 DIAGNOSIS — H53002 Unspecified amblyopia, left eye: Secondary | ICD-10-CM | POA: Diagnosis not present

## 2014-05-01 DIAGNOSIS — H43813 Vitreous degeneration, bilateral: Secondary | ICD-10-CM | POA: Diagnosis not present

## 2014-05-01 DIAGNOSIS — H26493 Other secondary cataract, bilateral: Secondary | ICD-10-CM | POA: Diagnosis not present

## 2014-05-04 ENCOUNTER — Other Ambulatory Visit: Payer: Self-pay | Admitting: *Deleted

## 2014-05-04 MED ORDER — RIVAROXABAN 20 MG PO TABS: 20.0000 mg | ORAL_TABLET | Freq: Every day | ORAL | Status: DC

## 2014-05-17 ENCOUNTER — Other Ambulatory Visit: Payer: Self-pay | Admitting: *Deleted

## 2014-05-17 MED ORDER — TEMAZEPAM 15 MG PO CAPS: 15.0000 mg | ORAL_CAPSULE | Freq: Every evening | ORAL | Status: DC | PRN

## 2014-05-22 ENCOUNTER — Other Ambulatory Visit: Payer: Self-pay | Admitting: *Deleted

## 2014-05-22 ENCOUNTER — Encounter: Payer: Self-pay | Admitting: Vascular Surgery

## 2014-05-22 DIAGNOSIS — I712 Thoracic aortic aneurysm, without rupture, unspecified: Secondary | ICD-10-CM

## 2014-05-22 DIAGNOSIS — Z01812 Encounter for preprocedural laboratory examination: Secondary | ICD-10-CM | POA: Diagnosis not present

## 2014-05-22 LAB — CREATININE, SERUM: Creat: 0.78 mg/dL (ref 0.50–1.10)

## 2014-05-22 LAB — BUN: BUN: 11 mg/dL (ref 6–23)

## 2014-05-23 ENCOUNTER — Ambulatory Visit
Admission: RE | Admit: 2014-05-23 | Discharge: 2014-05-23 | Disposition: A | Discharge status: Home / Self Care | Payer: Medicare Other | Source: Ambulatory Visit | Attending: Vascular Surgery | Admitting: Vascular Surgery

## 2014-05-23 ENCOUNTER — Encounter: Payer: Self-pay | Admitting: Vascular Surgery

## 2014-05-23 ENCOUNTER — Ambulatory Visit (INDEPENDENT_AMBULATORY_CARE_PROVIDER_SITE_OTHER): Payer: Medicare Other | Admitting: Vascular Surgery

## 2014-05-23 ENCOUNTER — Other Ambulatory Visit: Payer: Self-pay | Admitting: *Deleted

## 2014-05-23 VITALS — BP 120/55 | HR 75 | Resp 18 | Ht 60.25 in | Wt 248.2 lb

## 2014-05-23 DIAGNOSIS — Z48812 Encounter for surgical aftercare following surgery on the circulatory system: Secondary | ICD-10-CM

## 2014-05-23 DIAGNOSIS — I712 Thoracic aortic aneurysm, without rupture, unspecified: Secondary | ICD-10-CM

## 2014-05-23 DIAGNOSIS — I714 Abdominal aortic aneurysm, without rupture, unspecified: Secondary | ICD-10-CM

## 2014-05-23 MED ORDER — IOHEXOL 350 MG/ML SOLN
80.0000 mL | Freq: Once | INTRAVENOUS | Status: AC | PRN
  Administered 2014-05-23: 80 mL via INTRAVENOUS

## 2014-05-23 NOTE — Addendum Note (Signed)
Addended by: Mena Goes on: 05/23/2014 05:02 PM   Modules accepted: Orders

## 2014-05-23 NOTE — Progress Notes (Signed)
Here today for follow-up of her saccular distal descending thoracic aneurysm and also follow-up of stent graft repair of her abdominal aortic aneurysm. She reports that she remains in her usual health. She does have aches and pains all over she reports but is able to do her daily activity without difficulty. She has no symptoms referable to her aneurysm.  Past Medical History  Diagnosis Date  . COPD (chronic obstructive pulmonary disease)   . H/O: lung cancer     reported in remission  . DVT (deep venous thrombosis) 2004    BLE  . AAA (abdominal aortic aneurysm)   . Hypothyroidism   . Hyperlipidemia   . History of uterine fibroid   . Hiatal hernia   . History of fibrocystic disease of breast   . HTN (hypertension)   . Anxiety and depression   . PAD (peripheral artery disease)   . Ovarian mass     right benign  . Anticoagulant long-term use     Failed on Coumadin. On Xarelto  . Atrial fib/flutter, transient June 2012  . Normal nuclear stress test 2012    May 2012  . Esophageal dysmotility   . History of bronchitis   . Exertional dyspnea   . OSA (obstructive sleep apnea)     "haven't been using my CPAP lately" (01/21/12)  . OA (osteoarthritis)     "knees; left shoulder"  . Chronic lower back pain   . Anxiety   . Small cell carcinoma of lung 2004    NON-SMALL CELL CARCINOMA OF THE LUNG, METASTATIC TO THE SUPRACLAVICULAR AND MEDIASTINAL LYMPH NODES; in remission  . Ovarian mass     benign, right  . A-fib     History  Substance Use Topics  . Smoking status: Former Smoker -- 1.00 packs/day for 40 years    Types: Cigarettes    Quit date: 06/23/2001  . Smokeless tobacco: Never Used  . Alcohol Use: No    Family History  Problem Relation Age of Onset  . Heart disease Father   . Heart attack Father   . Diabetes type II Mother   . Diabetes Mother   . Hypertension Mother   . Heart disease Brother   . Heart disease Sister     See's Dr. Acie Fredrickson    Allergies  Allergen  Reactions  . Amoxicillin Other (See Comments)    Funny feeling    Current outpatient prescriptions: atorvastatin (LIPITOR) 10 MG tablet, Take 1 tablet by mouth daily., Disp: , Rfl: ;  Calcium Carbonate-Vitamin D (CALCIUM + D PO), Take 2 tablets by mouth daily. Vitamin D3, Disp: , Rfl: ;  CARTIA XT 120 MG 24 hr capsule, TAKE 1 CAPSULE EVERY DAY, Disp: 90 capsule, Rfl: 1;  Cholecalciferol (VITAMIN D) 1000 UNITS capsule, Take 1,000 Units by mouth daily.  , Disp: , Rfl:  citalopram (CELEXA) 20 MG tablet, Take 20 mg by mouth daily. , Disp: , Rfl: ;  furosemide (LASIX) 40 MG tablet, Take 40 mg by mouth daily. When going out only takes 20 mg, one half tablet, Disp: , Rfl: ;  levothyroxine (SYNTHROID, LEVOTHROID) 88 MCG tablet, Take 88 mcg by mouth daily., Disp: , Rfl: ;  losartan (COZAAR) 50 MG tablet, Take 1 tablet (50 mg total) by mouth daily., Disp: 30 tablet, Rfl: 1 metoprolol tartrate (LOPRESSOR) 25 MG tablet, Take 0.5 tablets (12.5 mg total) by mouth 2 (two) times daily., Disp: 180 tablet, Rfl: 3;  morphine (MSIR) 15 MG tablet, Take 15 mg by  mouth every 4 (four) hours as needed. pain, Disp: , Rfl: ;  NON FORMULARY, daily. 2 liters oxygen, Disp: , Rfl: ;  rivaroxaban (XARELTO) 20 MG TABS tablet, Take 1 tablet (20 mg total) by mouth daily with supper., Disp: 30 tablet, Rfl: 6 temazepam (RESTORIL) 15 MG capsule, Take 1 capsule (15 mg total) by mouth at bedtime as needed. sleep, Disp: 90 capsule, Rfl: 0;  tiotropium (SPIRIVA HANDIHALER) 18 MCG inhalation capsule, INHALE THE CONTENTS OF 1 CAPSULE USING HANDIHALER DEVICE ONCE DAILY AS DIRECTED, Disp: 30 capsule, Rfl: 6;  Umeclidinium-Vilanterol (ANORO ELLIPTA) 62.5-25 MCG/INH AEPB, Inhale 1 puff into the lungs daily., Disp: 2 each, Rfl: 0 albuterol (PROVENTIL HFA;VENTOLIN HFA) 108 (90 BASE) MCG/ACT inhaler, Inhale 2 puffs into the lungs every 6 (six) hours as needed for wheezing or shortness of breath., Disp: 1 Inhaler, Rfl: prn;  potassium chloride SA  (K-DUR,KLOR-CON) 20 MEQ tablet, Take 1 tablet (20 mEq total) by mouth 2 (two) times daily., Disp: 180 tablet, Rfl: 3  BP 120/55 mmHg  Pulse 75  Resp 18  Ht 5' 0.25" (1.53 m)  Wt 248 lb 3.2 oz (112.583 kg)  BMI 48.09 kg/m2  Body mass index is 48.09 kg/(m^2).       Physical exam: Well-developed well-nourished obese white female in no acute distress Respirations are equal nonlabored abdomen obesity do not feel an aneurysm Bilateral lower extremity edema  CT scan of the chest and abdomen was reviewed with the patient. This does show a saccular aneurysm of the distal thoracic aorta. Compared to the prior study year ago. She has had slight increased size from 4.2-4.5 cm. She has excellent repair of her infrarenal aneurysm with continued slight decrease in her psych size down to 2.9 cm with no evidence of endoleak  Had long discussion with the patient. I do not feel that there is any indication for repair of her 4.5 cm saccular aneurysm. Have recommended that we see her again in one year for continued follow-up. She has ongoing durable repair of her infrarenal aorta with stent graft. I reviewed symptoms of leaking thoracic aneurysm to her and she does report immediately to the emergency room should this occur

## 2014-05-29 ENCOUNTER — Encounter: Payer: Self-pay | Admitting: Internal Medicine

## 2014-05-29 ENCOUNTER — Ambulatory Visit (INDEPENDENT_AMBULATORY_CARE_PROVIDER_SITE_OTHER): Payer: Medicare Other | Admitting: Internal Medicine

## 2014-05-29 VITALS — BP 126/68 | HR 56 | Ht 62.0 in | Wt 255.0 lb

## 2014-05-29 DIAGNOSIS — Z1389 Encounter for screening for other disorder: Secondary | ICD-10-CM | POA: Diagnosis not present

## 2014-05-29 DIAGNOSIS — D508 Other iron deficiency anemias: Secondary | ICD-10-CM | POA: Diagnosis not present

## 2014-05-29 DIAGNOSIS — E785 Hyperlipidemia, unspecified: Secondary | ICD-10-CM | POA: Diagnosis not present

## 2014-05-29 DIAGNOSIS — J449 Chronic obstructive pulmonary disease, unspecified: Secondary | ICD-10-CM | POA: Diagnosis not present

## 2014-05-29 DIAGNOSIS — G4733 Obstructive sleep apnea (adult) (pediatric): Secondary | ICD-10-CM | POA: Diagnosis not present

## 2014-05-29 DIAGNOSIS — E039 Hypothyroidism, unspecified: Secondary | ICD-10-CM | POA: Diagnosis not present

## 2014-05-29 DIAGNOSIS — J432 Centrilobular emphysema: Secondary | ICD-10-CM | POA: Diagnosis not present

## 2014-05-29 DIAGNOSIS — I480 Paroxysmal atrial fibrillation: Secondary | ICD-10-CM | POA: Diagnosis not present

## 2014-05-29 DIAGNOSIS — I10 Essential (primary) hypertension: Secondary | ICD-10-CM | POA: Diagnosis not present

## 2014-05-29 NOTE — Patient Instructions (Signed)
We can continue O2 2L Advanced  Ok to continue Spiriva and albuterol  Please call as needed  You might mention to Dr Reynaldo Minium and Dr Martinique about your slow heart rate

## 2014-05-29 NOTE — Progress Notes (Signed)
05/19/11- 18 yoF former smoker followed for COPD and OSA with hx Lung CA/xrt/chemo, DVT, AFlutter LOV- 04/16/10 Has had flu vaccine. Reports coughing up a streak of blood about 2 weeks ago. Chest has been tight, not much cough-mostly with meals. She thinks swallowing triggers her cough. More short of breath than usual. Some reflux. No chest pain or palpitation fever, sweat or enlarged glands. Using Combivent at least once most days has made her feel better. Blows nose occasionally with some blood or green. Denies headache. Continues CPAP at 12 CWP/Advanced/O2 2 L per minute for sleep Had CT scan of chest 05/06/2011 to followup of her aortic aneurysm. Showed emphysema, stable aneurysm, enlarged right lobe of thyroid, coronary disease. Images reviewed.  01/20/12- 6 yoF former smoker followed for COPD and OSA with hx Lung CA/xrt/chemo, DVT, AFlutter Increased SOB x 2 months and getting worse; unable to do many activities Dry cough treated w/ cough drops; denies fever, sweat, chest pain.  Feet and legs stay swollen. + Hx DVT, on ASA- eval by Dr Reynaldo Minium, Dr early. Cardiology has told her "heart is fine'. Dr Jana Hakim has told her no recurrence of lung Ca. She stopped CPAP after bad cold last winter (Advanced). Still sleeping with O2 2L/M  01/29/12-  71 yoF former smoker followed for COPD and OSA with hx Lung CA/xrt/chemo, DVT, AFlutter Post hospital 01/21/12-01/23/12 decompensated OHS/ OSA with fluid overload. Started on home oxygen. Trying CPAP again using nasal pillows. temazepam helps. Has a rescue Combivent but also uses Spiriva sore address this. She very rarely needs her rescue inhaler but we are going  to switch to albuterol Leg vein dopplers 01/21/12-- Neg, although we initially got a telephone report of superficial phlebitis which is when we sent her to the hospital for evaluation. CTa- 01/21/12-  IMPRESSION:  No evidence for pulmonary embolism.  Small right pleural effusion.  Diffuse emphysematous  disease and subtle parenchymal densities in  the lungs. Findings could represent atelectasis or mild edema.  Few areas of volume loss but no focal consolidation.  Stable saccular aneurysm involving descending thoracic aorta.  Stable nodules in the lower neck may be related to thyroid nodules.  Original Report Authenticated By: Markus Daft, M.D.   04/12/12- 83 yoF former smoker followed for COPD and OSA with hx Lung CA/xrt/chemo, DVT, AFlutter Had flu vaccine. Not using CPAP at all. Continues oxygen 2 L/Advanced. Rarely needs albuterol rescue inhaler. Asks about portable oxygen concentrator. No longer on Coumadin for history of DVT. Just using aspirin. COPD assessment test (CAT) score 22/40  10/11/12- 71 yoF former smoker followed for COPD and OSA with hx Lung CA/xrt/chemo, DVT, AFlutter FOLLOWS FOR: Increased SOB with allergies flaring up She says she "got tired of bothering with oxygen" and left it in the car. By the time she got to her exam room today saturation was 83% on room air. infrequent use of rescue inhaler. Advanced O2 2L Not using CPAP. CT chest 05/11/12 ( for Dr Donnetta Hutching) IMPRESSION:  1. Slight increase in size of a 4.5cm saccular aneurysm from the  mid descending thoracic aorta.  2. Bilateral thyroid lesions. Consider further evaluation with  thyroid ultrasound. If patient is clinically hyperthyroid,  consider nuclear medicine thyroid uptake and scan.  05/16/13- 32 yoF former smoker followed for COPD and OSA/ failed CPAP,  with hx Lung CA/xrt/chemo, DVT, AFlutter Dyspnea on exertion. Continues to wear oxygen 2 L/Advanced. Mild hacking cough over the past 6 months. No phlegm. Denies chest pain, fever, sweat, adenopathy. Chest  CT pending tomorrow with Dr. Donnetta Hutching. Spiriva helpful. Using pro-air once or twice daily now.  11/28/13   FOLLOWS FOR: Pt states she is SOB with any abmbulation and with little daily activty. C/o minimal dry cough. Denies CP/tightness.  Continues oxygen 2  L/Advanced. Quit CPAP long ago. No bleeding on Xarelto  05/29/14- 73 yoF former smoker followed for COPD and OSA/ failed CPAP,  with hx Lung CA/xrt/chemo, DVT, AFlutter FOLLOWS FOR: has "good and bad days", c/o sob with exertion.  Allergy vaccine 1:10 GO   O2 2L/ Advanced Anoro not helpful. Uses albuterol. Spiriva OK. CT chest 05/23/14 IMPRESSION: Saccular aneurysm of the mid descending thoracic aorta has slightly increased from a maximal diameter of 4.2 cm to 4.5 cm. Stable aorto bi-iliac stent graft without evidence of endoleak. Maximal diameter is 2.9 cm. Electronically Signed  By: Maryclare Bean M.D.  On: 05/23/2014 11:25  ROS-see HPI Constitutional:   No-   weight loss, night sweats, fevers, chills, fatigue, lassitude. HEENT:   No-  headaches, difficulty swallowing, tooth/dental problems, sore throat,       No-  sneezing, itching, ear ache, nasal congestion, post nasal drip,  CV:  No-   chest pain, orthopnea, PND, +less swelling in lower extremities, No-anasarca, dizziness,   +Occasional palpitations Resp:    +shortness of breath with exertion or at rest.              No- productive cough,  +non-productive cough,  no-coughing up of blood.              No-   change in color of mucus.  No- wheezing.   Skin: No-   rash or lesions. GI:  No-   heartburn, indigestion, abdominal pain, nausea, vomiting,  GU: N MS:  No-   joint pain or swelling.   Neuro-     nothing unusual Psych:  No- change in mood or affect. No depression or anxiety.  No memory loss.  OBJ- Physical Exam General- Alert, Oriented, Affect-appropriate, Distress- none, obese. O2 2L/ Advanced Skin- rash-none, lesions- none, excoriation- none Lymphadenopathy- none Head- atraumatic            Eyes- Gross vision intact, PERRLA, conjunctivae and secretions clear            Ears- Hearing, canals-normal            Nose- Clear, no-Septal dev, mucus, polyps, erosion, perforation             Throat- Mallampati II , mucosa clear ,  drainage- none, tonsils- atrophic, + dentures Neck- flexible , trachea midline, no stridor , thyroid nl, carotid no bruit Chest - symmetrical excursion , unlabored           Heart/CV- IRR ~60 regular, no murmur , no gallop  , no rub, nl s1 s2                           - JVD- none , + trace edema, + stasis changes, varices- none           Lung- clear, wheeze- none, cough- none , dullness-none, rub- none           Chest wall-  Abd-  Br/ Gen/ Rectal- Not done, not indicated Extrem- cyanosis- none, clubbing, none, atrophy- none, strength- nl, Stasis                    changes with heavy legs Neuro- grossly intact  to observation

## 2014-06-04 ENCOUNTER — Emergency Department (HOSPITAL_COMMUNITY)
Admission: EM | Admit: 2014-06-04 | Discharge: 2014-06-05 | Disposition: A | Discharge status: Home / Self Care | Payer: Medicare Other | Attending: Emergency Medicine | Admitting: Emergency Medicine

## 2014-06-04 DIAGNOSIS — Z8542 Personal history of malignant neoplasm of other parts of uterus: Secondary | ICD-10-CM | POA: Insufficient documentation

## 2014-06-04 DIAGNOSIS — J449 Chronic obstructive pulmonary disease, unspecified: Secondary | ICD-10-CM | POA: Insufficient documentation

## 2014-06-04 DIAGNOSIS — I4891 Unspecified atrial fibrillation: Secondary | ICD-10-CM | POA: Insufficient documentation

## 2014-06-04 DIAGNOSIS — Z880 Allergy status to penicillin: Secondary | ICD-10-CM | POA: Diagnosis not present

## 2014-06-04 DIAGNOSIS — Z8543 Personal history of malignant neoplasm of ovary: Secondary | ICD-10-CM | POA: Insufficient documentation

## 2014-06-04 DIAGNOSIS — E039 Hypothyroidism, unspecified: Secondary | ICD-10-CM | POA: Insufficient documentation

## 2014-06-04 DIAGNOSIS — Z86718 Personal history of other venous thrombosis and embolism: Secondary | ICD-10-CM | POA: Insufficient documentation

## 2014-06-04 DIAGNOSIS — Z7901 Long term (current) use of anticoagulants: Secondary | ICD-10-CM | POA: Diagnosis not present

## 2014-06-04 DIAGNOSIS — Z8719 Personal history of other diseases of the digestive system: Secondary | ICD-10-CM | POA: Diagnosis not present

## 2014-06-04 DIAGNOSIS — S99912A Unspecified injury of left ankle, initial encounter: Secondary | ICD-10-CM | POA: Diagnosis present

## 2014-06-04 DIAGNOSIS — Y9389 Activity, other specified: Secondary | ICD-10-CM | POA: Insufficient documentation

## 2014-06-04 DIAGNOSIS — Z79899 Other long term (current) drug therapy: Secondary | ICD-10-CM | POA: Insufficient documentation

## 2014-06-04 DIAGNOSIS — Y998 Other external cause status: Secondary | ICD-10-CM | POA: Diagnosis not present

## 2014-06-04 DIAGNOSIS — S8002XA Contusion of left knee, initial encounter: Secondary | ICD-10-CM | POA: Insufficient documentation

## 2014-06-04 DIAGNOSIS — Z87891 Personal history of nicotine dependence: Secondary | ICD-10-CM | POA: Diagnosis not present

## 2014-06-04 DIAGNOSIS — Z8739 Personal history of other diseases of the musculoskeletal system and connective tissue: Secondary | ICD-10-CM | POA: Insufficient documentation

## 2014-06-04 DIAGNOSIS — E785 Hyperlipidemia, unspecified: Secondary | ICD-10-CM | POA: Insufficient documentation

## 2014-06-04 DIAGNOSIS — Z85118 Personal history of other malignant neoplasm of bronchus and lung: Secondary | ICD-10-CM | POA: Insufficient documentation

## 2014-06-04 DIAGNOSIS — I10 Essential (primary) hypertension: Secondary | ICD-10-CM | POA: Insufficient documentation

## 2014-06-04 DIAGNOSIS — F329 Major depressive disorder, single episode, unspecified: Secondary | ICD-10-CM | POA: Diagnosis not present

## 2014-06-04 DIAGNOSIS — W272XXA Contact with scissors, initial encounter: Secondary | ICD-10-CM | POA: Insufficient documentation

## 2014-06-04 DIAGNOSIS — T148XXA Other injury of unspecified body region, initial encounter: Secondary | ICD-10-CM

## 2014-06-04 DIAGNOSIS — G8929 Other chronic pain: Secondary | ICD-10-CM | POA: Diagnosis not present

## 2014-06-04 DIAGNOSIS — Y9289 Other specified places as the place of occurrence of the external cause: Secondary | ICD-10-CM | POA: Insufficient documentation

## 2014-06-04 DIAGNOSIS — F419 Anxiety disorder, unspecified: Secondary | ICD-10-CM | POA: Diagnosis not present

## 2014-06-04 DIAGNOSIS — K649 Unspecified hemorrhoids: Secondary | ICD-10-CM | POA: Diagnosis not present

## 2014-06-05 ENCOUNTER — Encounter (HOSPITAL_COMMUNITY): Payer: Self-pay | Admitting: *Deleted

## 2014-06-05 NOTE — ED Notes (Signed)
Wound cleaned with water.  No rebleeding noted.

## 2014-06-05 NOTE — Discharge Instructions (Signed)
Keep wound covered. Return to the emergency department for persistent bleeding.

## 2014-06-05 NOTE — ED Provider Notes (Signed)
CSN: [id number]     Arrival date & time 06/04/14  2354 History   First MD Initiated Contact with Patient 06/05/14 0005     Chief Complaint  Patient presents with  . Laceration   Patient is a 73 y.o. female presenting with skin laceration. The history is provided by the patient. No language interpreter was used.  Laceration  This chart was scribed for Julianne Rice, MD by Thea Alken, ED Scribe. This patient was seen in room B15C/B15C and the patient's care was started at 12:21 AM.  HPI Comments:  [name] is a 73 y.o. female who present to the Emergency Department complaining of a left ankle bleeding that occured 2.5 hours ago. Pt states she intentionally cut skin tag off her left ankle with scissors. Pt takes xeralto and reports she was unable to control bleeding. Pt states she otherwise feels fine. She denies dizziness and light headedness.      Past Medical History  Diagnosis Date  . COPD (chronic obstructive pulmonary disease)   . H/O: lung cancer     reported in remission  . DVT (deep venous thrombosis) 2004    BLE  . AAA (abdominal aortic aneurysm)   . Hypothyroidism   . Hyperlipidemia   . History of uterine fibroid   . Hiatal hernia   . History of fibrocystic disease of breast   . HTN (hypertension)   . Anxiety and depression   . PAD (peripheral artery disease)   . Ovarian mass     right benign  . Anticoagulant long-term use     Failed on Coumadin. On Xarelto  . Atrial fib/flutter, transient June 2012  . Normal nuclear stress test 2012    May 2012  . Esophageal dysmotility   . History of bronchitis   . Exertional dyspnea   . OSA (obstructive sleep apnea)     "haven't been using my CPAP lately" (01/21/12)  . OA (osteoarthritis)     "knees; left shoulder"  . Chronic lower back pain   . Anxiety   . Small cell carcinoma of lung 2004    NON-SMALL CELL CARCINOMA OF THE LUNG, METASTATIC TO THE SUPRACLAVICULAR AND MEDIASTINAL LYMPH NODES; in remission  . Ovarian  mass     benign, right  . A-fib    Past Surgical History  Procedure Laterality Date  . Abdominal aortic aneurysm repair  ~ 2010    stent graft  . Replacement total knee  ~ 2008    left  . Thyroidectomy  ~ 1964  . Bladder surgery  ~ 2003    sling  . Cardiovascular stress test  2012    No ischemia  . Breast biopsy  1960's    both breast's - benign  . Cataract extraction w/ intraocular lens  implant, bilateral  ~ 2009   Family History  Problem Relation Age of Onset  . Heart disease Father   . Heart attack Father   . Diabetes type II Mother   . Diabetes Mother   . Hypertension Mother   . Heart disease Brother   . Heart disease Sister     See's Dr. Acie Fredrickson   History  Substance Use Topics  . Smoking status: Former Smoker -- 1.00 packs/day for 40 years    Types: Cigarettes    Quit date: 06/23/2001  . Smokeless tobacco: Never Used  . Alcohol Use: No   OB History    No data available     Review of Systems  Skin: Positive for wound.  Neurological: Negative for dizziness and light-headedness.  Hematological: Bruises/bleeds easily.  All other systems reviewed and are negative.     Allergies  Amoxicillin  Home Medications   Prior to Admission medications   Medication Sig Start Date End Date Taking? Authorizing Provider  albuterol (PROVENTIL HFA;VENTOLIN HFA) 108 (90 BASE) MCG/ACT inhaler Inhale 2 puffs into the lungs every 6 (six) hours as needed for wheezing or shortness of breath. 05/16/13 05/29/14  Deneise Lever, MD  atorvastatin (LIPITOR) 10 MG tablet Take 1 tablet by mouth daily. 02/23/13   Historical Provider, MD  Calcium Carbonate-Vitamin D (CALCIUM + D PO) Take 2 tablets by mouth daily. Vitamin D3    Historical Provider, MD  CARTIA XT 120 MG 24 hr capsule TAKE 1 CAPSULE EVERY DAY    Peter M Martinique, MD  Cholecalciferol (VITAMIN D) 1000 UNITS capsule Take 1,000 Units by mouth daily.      Historical Provider, MD  citalopram (CELEXA) 20 MG tablet Take 20 mg by  mouth daily.     Historical Provider, MD  furosemide (LASIX) 40 MG tablet Take 40 mg by mouth daily. When going out only takes 20 mg, one half tablet 03/22/13   Burtis Junes, NP  levothyroxine (SYNTHROID, LEVOTHROID) 88 MCG tablet Take 88 mcg by mouth daily.    Historical Provider, MD  losartan (COZAAR) 50 MG tablet Take 1 tablet (50 mg total) by mouth daily. 05/21/12   Peter M Martinique, MD  metoprolol tartrate (LOPRESSOR) 25 MG tablet Take 0.5 tablets (12.5 mg total) by mouth 2 (two) times daily. 02/02/14   Peter M Martinique, MD  morphine (MSIR) 15 MG tablet Take 15 mg by mouth every 4 (four) hours as needed. pain    Historical Provider, MD  NON FORMULARY daily. 2 liters oxygen    Historical Provider, MD  potassium chloride SA (K-DUR,KLOR-CON) 20 MEQ tablet Take 1 tablet (20 mEq total) by mouth 2 (two) times daily. 12/01/12 12/01/13  Peter M Martinique, MD  rivaroxaban (XARELTO) 20 MG TABS tablet Take 1 tablet (20 mg total) by mouth daily with supper. 05/04/14   Peter M Martinique, MD  temazepam (RESTORIL) 15 MG capsule Take 1 capsule (15 mg total) by mouth at bedtime as needed. sleep 05/17/14 11/13/14  Deneise Lever, MD  tiotropium (SPIRIVA HANDIHALER) 18 MCG inhalation capsule INHALE THE CONTENTS OF 1 CAPSULE USING HANDIHALER DEVICE ONCE DAILY AS DIRECTED 01/19/14   Deneise Lever, MD   BP 118/56 mmHg  Pulse 60  Temp(Src) 97.9 F (36.6 C) (Oral)  Resp 20  SpO2 95% Physical Exam  Constitutional: She is oriented to person, place, and time. She appears well-developed and well-nourished. No distress.  HENT:  Head: Normocephalic and atraumatic.  Mouth/Throat: Oropharynx is clear and moist.  Eyes: EOM are normal. Pupils are equal, round, and reactive to light.  Neck: Normal range of motion. Neck supple.  Cardiovascular: Normal rate and regular rhythm.   Pulmonary/Chest: Effort normal.  Abdominal: Soft.  Musculoskeletal: Normal range of motion. She exhibits no edema or tenderness.  Neurological: She is  alert and oriented to person, place, and time.  Skin: Skin is warm and dry. No rash noted. No erythema.  Small superficial area of bleeding on the anterior surface of the left ankle. Bleeding is a slow ooze.  Psychiatric: She has a normal mood and affect. Her behavior is normal.  Nursing note and vitals reviewed.   ED Course  Procedures (including critical care time)  Labs Review Labs Reviewed - No data to display  Imaging Review No results found.   EKG Interpretation None      MDM   Final diagnoses:  None    Electrocautery to the site of injury with effective control of bleeding.   I personally performed the services described in this documentation, which was scribed in my presence. The recorded information has been reviewed and is accurate.  Patient observed in the emergency department with no further bleeding. She's been given return precautions and is voiced understanding.  Julianne Rice, MD 06/05/14 [id number]

## 2014-06-05 NOTE — ED Notes (Signed)
The pt cut a piece of skin  On her lt lower leg off with scissors.  She is on xarelto and she has been bleeding since this occurred at 2200

## 2014-06-11 NOTE — Assessment & Plan Note (Signed)
She continues oxygen at 2 L as directed. Plan-encouraged to get weight down and to sleep off flat of back.

## 2014-06-11 NOTE — Assessment & Plan Note (Signed)
Stable with oxygen. Continue Spiriva and rescue albuterol.

## 2014-06-14 DIAGNOSIS — M19012 Primary osteoarthritis, left shoulder: Secondary | ICD-10-CM | POA: Diagnosis not present

## 2014-06-18 ENCOUNTER — Other Ambulatory Visit: Payer: Self-pay | Admitting: Cardiology

## 2014-06-28 DIAGNOSIS — G44209 Tension-type headache, unspecified, not intractable: Secondary | ICD-10-CM | POA: Diagnosis not present

## 2014-06-28 DIAGNOSIS — I10 Essential (primary) hypertension: Secondary | ICD-10-CM | POA: Diagnosis not present

## 2014-06-28 DIAGNOSIS — Z6841 Body Mass Index (BMI) 40.0 and over, adult: Secondary | ICD-10-CM | POA: Diagnosis not present

## 2014-06-29 DIAGNOSIS — H26491 Other secondary cataract, right eye: Secondary | ICD-10-CM | POA: Diagnosis not present

## 2014-07-03 ENCOUNTER — Other Ambulatory Visit: Payer: Self-pay | Admitting: Internal Medicine

## 2014-07-03 DIAGNOSIS — G44209 Tension-type headache, unspecified, not intractable: Secondary | ICD-10-CM

## 2014-07-03 DIAGNOSIS — R519 Headache, unspecified: Secondary | ICD-10-CM

## 2014-07-03 DIAGNOSIS — R51 Headache: Principal | ICD-10-CM

## 2014-07-05 ENCOUNTER — Other Ambulatory Visit: Payer: Medicare Other

## 2014-07-05 ENCOUNTER — Ambulatory Visit
Admission: RE | Admit: 2014-07-05 | Discharge: 2014-07-05 | Disposition: A | Discharge status: Home / Self Care | Payer: Medicare Other | Source: Ambulatory Visit | Attending: Internal Medicine | Admitting: Internal Medicine

## 2014-07-05 DIAGNOSIS — C3490 Malignant neoplasm of unspecified part of unspecified bronchus or lung: Secondary | ICD-10-CM | POA: Diagnosis not present

## 2014-07-05 DIAGNOSIS — R51 Headache: Secondary | ICD-10-CM | POA: Diagnosis not present

## 2014-07-05 DIAGNOSIS — S0990XA Unspecified injury of head, initial encounter: Secondary | ICD-10-CM | POA: Diagnosis not present

## 2014-07-05 DIAGNOSIS — R519 Headache, unspecified: Secondary | ICD-10-CM

## 2014-07-24 DIAGNOSIS — E669 Obesity, unspecified: Secondary | ICD-10-CM | POA: Diagnosis not present

## 2014-07-24 DIAGNOSIS — Z6841 Body Mass Index (BMI) 40.0 and over, adult: Secondary | ICD-10-CM | POA: Diagnosis not present

## 2014-07-24 DIAGNOSIS — I10 Essential (primary) hypertension: Secondary | ICD-10-CM | POA: Diagnosis not present

## 2014-07-24 DIAGNOSIS — I480 Paroxysmal atrial fibrillation: Secondary | ICD-10-CM | POA: Diagnosis not present

## 2014-07-24 DIAGNOSIS — G44209 Tension-type headache, unspecified, not intractable: Secondary | ICD-10-CM | POA: Diagnosis not present

## 2014-07-24 DIAGNOSIS — J449 Chronic obstructive pulmonary disease, unspecified: Secondary | ICD-10-CM | POA: Diagnosis not present

## 2014-07-24 DIAGNOSIS — E785 Hyperlipidemia, unspecified: Secondary | ICD-10-CM | POA: Diagnosis not present

## 2014-07-24 DIAGNOSIS — I739 Peripheral vascular disease, unspecified: Secondary | ICD-10-CM | POA: Diagnosis not present

## 2014-07-24 DIAGNOSIS — E039 Hypothyroidism, unspecified: Secondary | ICD-10-CM | POA: Diagnosis not present

## 2014-07-24 DIAGNOSIS — F418 Other specified anxiety disorders: Secondary | ICD-10-CM | POA: Diagnosis not present

## 2014-07-27 ENCOUNTER — Ambulatory Visit (INDEPENDENT_AMBULATORY_CARE_PROVIDER_SITE_OTHER): Payer: Medicare Other | Admitting: Neurology

## 2014-07-27 ENCOUNTER — Telehealth: Payer: Self-pay | Admitting: Neurology

## 2014-07-27 ENCOUNTER — Encounter: Payer: Self-pay | Admitting: Neurology

## 2014-07-27 VITALS — BP 108/62 | HR 60 | Ht 62.0 in | Wt 247.0 lb

## 2014-07-27 DIAGNOSIS — M5481 Occipital neuralgia: Secondary | ICD-10-CM

## 2014-07-27 DIAGNOSIS — G4451 Hemicrania continua: Secondary | ICD-10-CM | POA: Diagnosis not present

## 2014-07-27 LAB — COMPREHENSIVE METABOLIC PANEL
ALT: 15 U/L (ref 0–35)
AST: 19 U/L (ref 0–37)
Albumin: 3.5 g/dL (ref 3.5–5.2)
Alkaline Phosphatase: 60 U/L (ref 39–117)
BUN: 11 mg/dL (ref 6–23)
CO2: 27 mEq/L (ref 19–32)
Calcium: 9.4 mg/dL (ref 8.4–10.5)
Chloride: 101 mEq/L (ref 96–112)
Creat: 0.85 mg/dL (ref 0.50–1.10)
Glucose, Bld: 78 mg/dL (ref 70–99)
Potassium: 4.6 mEq/L (ref 3.5–5.3)
Sodium: 136 mEq/L (ref 135–145)
Total Bilirubin: 0.7 mg/dL (ref 0.2–1.2)
Total Protein: 6.1 g/dL (ref 6.0–8.3)

## 2014-07-27 LAB — CBC WITH DIFFERENTIAL/PLATELET
Basophils Absolute: 0 10*3/uL (ref 0.0–0.1)
Basophils Relative: 0 % (ref 0–1)
Eosinophils Absolute: 0.1 10*3/uL (ref 0.0–0.7)
Eosinophils Relative: 1 % (ref 0–5)
HCT: 43.6 % (ref 36.0–46.0)
Hemoglobin: 14.6 g/dL (ref 12.0–15.0)
Lymphocytes Relative: 33 % (ref 12–46)
Lymphs Abs: 2.1 10*3/uL (ref 0.7–4.0)
MCH: 34 pg (ref 26.0–34.0)
MCHC: 33.5 g/dL (ref 30.0–36.0)
MCV: 101.4 fL — ABNORMAL HIGH (ref 78.0–100.0)
MPV: 9.4 fL (ref 8.6–12.4)
Monocytes Absolute: 0.7 10*3/uL (ref 0.1–1.0)
Monocytes Relative: 11 % (ref 3–12)
Neutro Abs: 3.5 10*3/uL (ref 1.7–7.7)
Neutrophils Relative %: 55 % (ref 43–77)
Platelets: 192 10*3/uL (ref 150–400)
RBC: 4.3 MIL/uL (ref 3.87–5.11)
RDW: 13.2 % (ref 11.5–15.5)
WBC: 6.3 10*3/uL (ref 4.0–10.5)

## 2014-07-27 LAB — C-REACTIVE PROTEIN: CRP: <0.5 mg/dL (ref <0.60)

## 2014-07-27 NOTE — Telephone Encounter (Signed)
Danae Chen called back and states Dr Reynaldo Minium wants patient to hold the Xarelto for 1 day prior to nerve block. Patient made aware and appt r/s to Monday. She knows to hold Xarelto on Sunday. She will call with any problems.

## 2014-07-27 NOTE — Telephone Encounter (Signed)
LM with Danae Chen, Dr Jacquiline Doe assistant, to call back and let us know if safe to proceed with occipital nerve block while patient is on Xarelto. Awaiting call back.

## 2014-07-27 NOTE — Progress Notes (Signed)
[name] was seen today in neurologic consultation at the request of ARONSON,RICHARD A, MD.  The consultation is for the evaluation of headache.   The records that were made available to me were reviewed.   The patient is a 75 y.o. year old female with a history of headache.  The patient reports that headache began about early December 2015.  The location of the headache is in the L occiput and L vertex.  It is described as a stabbing pain.  There is no photophobia.  There is no phonophobia.  The patient reports that there is no associated nausea and no emesis.  The patient reports no autonomic features such as rhinorrhea, conjunctival erythema, or tearing of the eyes.  She has had some nasal congestion but thought that this was "sinus."    She denies vision change or loss of vision.  She does have a hx of a "lazy eye" since childhood and has never seen well out of one of the eyes.  The headache is all day long.  She uses an ice pack and that helps some.  She has morphine for back pain and that helps some; she takes that about one time per day.  She tried ibuprofen without relief.   She was started on topamax on 07/07/14.  She was started on 25 mg bid.  It was d/c on 07/24/2014 because it didn't work and she was started on neurontin 100 mg tid.  She had a prednisone taper in the past that she didn't find efficacious.   She doesn't consider herself a headachey person.  She did have headaches 20 years ago and states that she had a "mole removed" from her forehead and then quit having headaches.     Neuroimaging has previously been performed.  It is available for my review today.  She had a CT of the brain on 07/05/14 that I reviewed.  It was nonacute and just demonstrated small vessel disease.     ALLERGIES:   Allergies  Allergen Reactions  . Amoxicillin Other (See Comments)    Funny feeling    CURRENT MEDICATIONS:  Current Outpatient Prescriptions on File Prior to Visit  Medication Sig Dispense  Refill  . albuterol (PROVENTIL HFA;VENTOLIN HFA) 108 (90 BASE) MCG/ACT inhaler Inhale 2 puffs into the lungs every 6 (six) hours as needed for wheezing or shortness of breath. 1 Inhaler prn  . atorvastatin (LIPITOR) 10 MG tablet Take 1 tablet by mouth daily.    . Calcium Carbonate-Vitamin D (CALCIUM + D PO) Take 2 tablets by mouth daily. Vitamin D3    . CARTIA XT 120 MG 24 hr capsule TAKE 1 CAPSULE EVERY DAY 90 capsule 0  . Cholecalciferol (VITAMIN D) 1000 UNITS capsule Take 1,000 Units by mouth daily.      . citalopram (CELEXA) 20 MG tablet Take 20 mg by mouth daily.     . furosemide (LASIX) 40 MG tablet TAKE 1 TABLET TWICE DAILY 180 tablet 0  . levothyroxine (SYNTHROID, LEVOTHROID) 88 MCG tablet Take 88 mcg by mouth daily.    Marland Kitchen losartan (COZAAR) 50 MG tablet Take 1 tablet (50 mg total) by mouth daily. 30 tablet 1  . metoprolol tartrate (LOPRESSOR) 25 MG tablet Take 0.5 tablets (12.5 mg total) by mouth 2 (two) times daily. 180 tablet 3  . morphine (MSIR) 15 MG tablet Take 15 mg by mouth every 4 (four) hours as needed. pain    . NON FORMULARY daily. 2 liters oxygen    .  potassium chloride SA (K-DUR,KLOR-CON) 20 MEQ tablet Take 1 tablet (20 mEq total) by mouth 2 (two) times daily. 180 tablet 3  . rivaroxaban (XARELTO) 20 MG TABS tablet Take 1 tablet (20 mg total) by mouth daily with supper. 30 tablet 6  . temazepam (RESTORIL) 15 MG capsule Take 1 capsule (15 mg total) by mouth at bedtime as needed. sleep 90 capsule 0  . tiotropium (SPIRIVA HANDIHALER) 18 MCG inhalation capsule INHALE THE CONTENTS OF 1 CAPSULE USING HANDIHALER DEVICE ONCE DAILY AS DIRECTED 30 capsule 6   No current facility-administered medications on file prior to visit.    PAST MEDICAL HISTORY:   Past Medical History  Diagnosis Date  . COPD (chronic obstructive pulmonary disease)   . DVT (deep venous thrombosis) 2004    BLE  . AAA (abdominal aortic aneurysm)   . Hypothyroidism   . Hyperlipidemia   . History of uterine  fibroid   . Hiatal hernia   . History of fibrocystic disease of breast   . HTN (hypertension)   . Anxiety and depression   . PAD (peripheral artery disease)   . Ovarian mass     right benign  . Anticoagulant long-term use     Failed on Coumadin. On Xarelto  . Atrial fib/flutter, transient June 2012  . Normal nuclear stress test 2012    May 2012  . Esophageal dysmotility   . History of bronchitis   . Exertional dyspnea   . OSA (obstructive sleep apnea)     "haven't been using my CPAP lately" (01/21/12)  . OA (osteoarthritis)     "knees; left shoulder"  . Chronic lower back pain   . Anxiety   . Small cell carcinoma of lung 2004    NON-SMALL CELL CARCINOMA OF THE LUNG, METASTATIC TO THE SUPRACLAVICULAR AND MEDIASTINAL LYMPH NODES; in remission  . Ovarian mass     benign, right    PAST SURGICAL HISTORY:   Past Surgical History  Procedure Laterality Date  . Abdominal aortic aneurysm repair  ~ 2010    stent graft  . Replacement total knee Left ~ 2008    left  . Thyroidectomy  ~ 1964  . Bladder surgery  ~ 2003    sling  . Cardiovascular stress test  2012    No ischemia  . Breast biopsy  1960's    both breast's - benign  . Cataract extraction w/ intraocular lens  implant, bilateral Bilateral ~ 2009    SOCIAL HISTORY:   History   Social History  . Marital Status: Divorced    Spouse Name: N/A    Number of Children: 2  . Years of Education: N/A   Occupational History  . retired     Chiropractor   Social History Main Topics  . Smoking status: Former Smoker -- 1.00 packs/day for 40 years    Types: Cigarettes    Quit date: 06/23/2001  . Smokeless tobacco: Never Used  . Alcohol Use: 0.0 oz/week    0 Not specified per week     Comment: once a week (glass of wine)  . Drug Use: No  . Sexual Activity: No   Other Topics Concern  . Not on file   Social History Narrative    FAMILY HISTORY:   Family Status  Relation Status Death Age  . Father Deceased 28     heart attack  . Mother Deceased 82    diabetes - 11-Dec-2010 she is 41 y/o  . Brother Alive  heart disease  . Brother Alive     heart disease, DM  . Sister Deceased     suicide  . Sister Deceased     stroke  . Sister Alive     heart disease  . Daughter Alive     orthopaedic issues, thyroid disease  . Daughter Alive     healthy    ROS:  A complete 10 system review of systems was obtained and was unremarkable apart from what is mentioned above.  PHYSICAL EXAMINATION:    VITALS:   Filed Vitals:   07/27/14 1347  BP: 108/62  Pulse: 60  Height: 5\' 2"  (1.575 m)  Weight: 247 lb (112.038 kg)    GEN:  Appears stated age and in NAD. HEENT:  Normocephalic, atraumatic. The mucous membranes are very dry. The superficial temporal arteries are without ropiness or tenderness.  No STA bruits.  There is tenderness over the L occipital notch Cardiovascular: Regular rate and rhythm. Lungs: Clear to auscultation bilaterally.  Wearing O2.   Some DOE and some conversational dyspnea Neck: There are no carotid bruits noted bilaterally.  NEUROLOGICAL: Orientation:  Pt is alert and oriented x 3.  Fund of knowledge is appropriate.  Recent and remote memory intact.  Attention and concentration normal.  Able to repeat and name. Cranial nerves: There is good facial symmetry except mild L ptosis vs pseudoptosis. The pupils are equal round and reactive to light bilaterally. Funduscopic exam reveals clear disc margins bilaterally. Extraocular muscles are intact and visual fields are full to confrontational testing. Speech is fluent and clear. Soft palate rises symmetrically and there is no tongue deviation. Hearing is intact to conversational tone. Tone: Tone is good throughout. Sensation: Sensation is intact to light touch and pinprick throughout (facial, extremities, trunk). Vibration is decreased at the bilateral big toe. There is no extinction with double simultaneous stimulation. There is no sensory  dermatomal level identified. Coordination:  The patient has no difficulty with RAM's or FNF bilaterally. Motor: Strength is 5/5 in the bilateral upper and lower extremities. Shoulder shrug is equal and symmetric.  There is no pronator drift.  There are no fasciculations noted. DTR's: Deep tendon reflexes are 2+/4 at the bilateral biceps, triceps, brachioradialis, patella and absent at the bilateral achilles.  Plantar responses are downgoing bilaterally. Gait and Station: The patient is able to ambulate without difficulty.    IMPRESSIONS/PLAN:  1.  Headache.  This is either representative of left occipital neuralgia (which I am favoring given left occipital notch tenderness) or hemicrania continua (she meets all criteria except for the fact that it technically needs to have lasted for 3 months and hers has only persisted now for 2 months).  -I had a long discussion with her about these things in the differential diagnoses.  I talked to her about trying to do an occipital nerve block first.  I called her primary care physician.  He would like her off of Xarelto for one full day before the occipital nerve block is done.  We will schedule that for sometime next week, therefore.  -If the occipital nerve block is not helpful, then the treatment for hemicrania continua is indomethacin.  I talked to her about the down fall of this medication, primarily its GI side effects and renal side effects.  The GI side effects are far more worrisome.  Generally, one treats with Indocin for one month.  She and I talked about the risks/benefits/side effects and she understood, but I told her that  we were not trying this yet.  -I do think that the patient needs lab work done, including CBC, sedimentation rate, CRP and chemistry and we will order this today. 2.  Total face to face time was 60 min with greater than 50% in counseling and coordinating care, as above.  Thank you for allowing me to participate in the care your  patient.  Please feel free to contact me should there be any questions or concerns.

## 2014-07-28 ENCOUNTER — Ambulatory Visit: Payer: Medicare Other | Admitting: Neurology

## 2014-07-28 LAB — SEDIMENTATION RATE: Sed Rate: 27 mm/hr — ABNORMAL HIGH (ref 0–22)

## 2014-07-28 NOTE — Progress Notes (Signed)
Note faxed.

## 2014-07-31 ENCOUNTER — Encounter: Payer: Self-pay | Admitting: Neurology

## 2014-07-31 ENCOUNTER — Ambulatory Visit (INDEPENDENT_AMBULATORY_CARE_PROVIDER_SITE_OTHER): Payer: Medicare Other | Admitting: Neurology

## 2014-07-31 VITALS — BP 110/78 | HR 64 | Ht 62.0 in | Wt 245.0 lb

## 2014-07-31 DIAGNOSIS — M5481 Occipital neuralgia: Secondary | ICD-10-CM | POA: Diagnosis not present

## 2014-07-31 MED ORDER — METHYLPREDNISOLONE ACETATE 40 MG/ML IJ SUSP
40.0000 mL | Freq: Once | INTRAMUSCULAR | Status: AC
  Administered 2014-07-31: 40 mL via INTRAMUSCULAR

## 2014-07-31 MED ORDER — BUPIVACAINE HCL 0.5 % IJ SOLN
1.5000 mL | Freq: Once | INTRAMUSCULAR | Status: AC
  Administered 2014-07-31 (×2): 1.5 mL

## 2014-07-31 MED ORDER — METHYLPREDNISOLONE ACETATE PF 40 MG/ML IJ SUSP: 40.0000 mg | Freq: Once | INTRAMUSCULAR | Status: DC

## 2014-07-31 NOTE — Procedures (Signed)
The patient present today for occipital nerve block on the left due to occipital neuralgia.  After discussing r/b/se and alternative tx's, the patient decided to proceed with the occipital nerve block.  Pt has been off of xarelto for greater than 24 hours at request of PCP.  The area was cleansed with alcohol prior to the procedure.  Approximately 40 mg of depomedrol (1cc) and 1 cc of 0.5% marcaine were injected into the left occipital notch.  The patient tolerated the procedure well and there were no complications.  Pt was given a patient information handout prior to leaving.  Asked to call with any questions or concerns.

## 2014-07-31 NOTE — Progress Notes (Signed)
Here for nerve block procedure only

## 2014-08-02 ENCOUNTER — Telehealth: Payer: Self-pay | Admitting: Neurology

## 2014-08-02 NOTE — Telephone Encounter (Signed)
Spoke with patient and she states her headache is no better after occipital nerve block. She is experiencing no other symptoms. Please advise.

## 2014-08-03 NOTE — Telephone Encounter (Signed)
Patient made aware of the two options. She will think about it and call back.

## 2014-08-03 NOTE — Telephone Encounter (Signed)
If no better AT ALL, then we should probably try indocin for a month BUT will need approval from PCP given gi/renal side effects.  Make sure that she is on something to protect stomach (either PPI or pepcid/zantac).  If she had some positive benefit from nerve block, could repeat.

## 2014-08-07 NOTE — Telephone Encounter (Signed)
Spoke with patient. She spoke to her pharmacist and decided she would like to try Indocin. Called patient's PCP to get approval to put patient on medication and left a voicemail for DJ to call back.

## 2014-08-09 ENCOUNTER — Telehealth: Payer: Self-pay | Admitting: Neurology

## 2014-08-09 MED ORDER — INDOMETHACIN ER 75 MG PO CPCR: 75.0000 mg | ORAL_CAPSULE | Freq: Every day | ORAL | Status: DC

## 2014-08-09 NOTE — Telephone Encounter (Signed)
Erica from Dr Jacquiline Doe office called back and gave the okay for patient to start Indocin. Please advise on dosage and dosage of PPI.

## 2014-08-09 NOTE — Telephone Encounter (Signed)
Called [employer] for prior authorization of Indomethacin ER 75 mg. Prior authorization submitted and will be answered within 72 hours. [id number].

## 2014-08-09 NOTE — Addendum Note (Signed)
Addended byAnnamaria Helling on: 08/09/2014 10:55 AM   Modules accepted: Orders

## 2014-08-09 NOTE — Telephone Encounter (Signed)
Patient made aware Indocin sent to her pharmacy and she will start Pepcid with this medication as well. She will call with any problems.

## 2014-08-09 NOTE — Telephone Encounter (Signed)
Indocin SR 75 mg daily

## 2014-08-10 NOTE — Telephone Encounter (Signed)
Prior authorization approved and faxed to [place] at [phone] with confirmation received.

## 2014-08-31 DIAGNOSIS — H6122 Impacted cerumen, left ear: Secondary | ICD-10-CM | POA: Diagnosis not present

## 2014-08-31 DIAGNOSIS — R51 Headache: Secondary | ICD-10-CM | POA: Diagnosis not present

## 2014-08-31 DIAGNOSIS — H60332 Swimmer's ear, left ear: Secondary | ICD-10-CM | POA: Diagnosis not present

## 2014-09-01 ENCOUNTER — Other Ambulatory Visit: Payer: Self-pay | Admitting: *Deleted

## 2014-09-01 MED ORDER — DILTIAZEM HCL ER COATED BEADS 120 MG PO CP24: 120.0000 mg | ORAL_CAPSULE | Freq: Every day | ORAL | Status: DC

## 2014-10-03 DIAGNOSIS — G894 Chronic pain syndrome: Secondary | ICD-10-CM | POA: Diagnosis not present

## 2014-10-03 DIAGNOSIS — M5032 Other cervical disc degeneration, mid-cervical region: Secondary | ICD-10-CM | POA: Diagnosis not present

## 2014-10-03 DIAGNOSIS — M5136 Other intervertebral disc degeneration, lumbar region: Secondary | ICD-10-CM | POA: Diagnosis not present

## 2014-10-10 DIAGNOSIS — M25512 Pain in left shoulder: Secondary | ICD-10-CM | POA: Diagnosis not present

## 2014-10-13 ENCOUNTER — Ambulatory Visit (INDEPENDENT_AMBULATORY_CARE_PROVIDER_SITE_OTHER): Payer: Medicare Other | Admitting: Nurse Practitioner

## 2014-10-13 ENCOUNTER — Encounter: Payer: Self-pay | Admitting: Nurse Practitioner

## 2014-10-13 VITALS — BP 128/74 | HR 60 | Ht 62.0 in | Wt 249.0 lb

## 2014-10-13 DIAGNOSIS — R609 Edema, unspecified: Secondary | ICD-10-CM | POA: Diagnosis not present

## 2014-10-13 DIAGNOSIS — I5030 Unspecified diastolic (congestive) heart failure: Secondary | ICD-10-CM

## 2014-10-13 DIAGNOSIS — I482 Chronic atrial fibrillation, unspecified: Secondary | ICD-10-CM

## 2014-10-13 NOTE — Patient Instructions (Signed)
We will be checking the following labs today - NONE   Medication Instructions:    Continue with your current medicines.     Testing/Procedures To Be Arranged:  N/A  Follow-Up:   We will see you back on 6 months   Other Special Instructions:   N/A  Call the [facility] office at [phone] if you have any questions, problems or concerns.

## 2014-10-13 NOTE — Progress Notes (Addendum)
CARDIOLOGY OFFICE NOTE  Date:  10/13/2014    [name] Date of Birth: 09-25-1940 Medical Record #[id number]  PCP:  Geoffery Lyons, MD  Cardiologist:  [place]    Chief Complaint  Patient presents with  . Shortness of Breath    6 month check - seen for Dr. Martinique     History of Present Illness: [name] is a 74 y.o. female who presents today for a 6 month check. Seen for Dr. Martin[place]. She has chronic dyspnea. Has had past normal nuclear and echo. Does have moderate pulmonary HTN related to chronic COPD. Former smoker. Has had lung cancer treated with XRT and chemo. Other issues include OSA, past DVT, paroxysmal atrial flutter. Had her DVT while on coumadin. Xarelto has been too expensive. Has had issues with beta blocker and bradycardia in the past - but tolerates low dose.   Last seen back by me back in October - was felt to be stable. Mostly limited by back pain.   Comes back today. Here alone.  Labs checked by Dr. Reynaldo Minium.  Chronic swelling of her legs/feet. Limited by her back. Has seen neurology earlier this year for a persistent headache - now resolved. No chest pain. No awareness of her rhythm. No falls. No bleeding/excessive bruising. Thinks her breathing may be a little worse - on oxygen prn and at night.   Past Medical History  Diagnosis Date  . COPD (chronic obstructive pulmonary disease)   . DVT (deep venous thrombosis) 2004    BLE  . AAA (abdominal aortic aneurysm)   . Hypothyroidism   . Hyperlipidemia   . History of uterine fibroid   . Hiatal hernia   . History of fibrocystic disease of breast   . HTN (hypertension)   . Anxiety and depression   . PAD (peripheral artery disease)   . Ovarian mass     right benign  . Anticoagulant long-term use     Failed on Coumadin. On Xarelto  . Atrial fib/flutter, transient June 2012  . Normal nuclear stress test 2012    May 2012  . Esophageal dysmotility   . History of bronchitis   . Exertional dyspnea   .  OSA (obstructive sleep apnea)     "haven't been using my CPAP lately" (01/21/12)  . OA (osteoarthritis)     "knees; left shoulder"  . Chronic lower back pain   . Anxiety   . Small cell carcinoma of lung 2004    NON-SMALL CELL CARCINOMA OF THE LUNG, METASTATIC TO THE SUPRACLAVICULAR AND MEDIASTINAL LYMPH NODES; in remission  . Ovarian mass     benign, right    Past Surgical History  Procedure Laterality Date  . Abdominal aortic aneurysm repair  ~ 2010    stent graft  . Replacement total knee Left ~ 2008    left  . Thyroidectomy  ~ 1964  . Bladder surgery  ~ 2003    sling  . Cardiovascular stress test  2012    No ischemia  . Breast biopsy  1960's    both breast's - benign  . Cataract extraction w/ intraocular lens  implant, bilateral Bilateral ~ 2009     Medications: Current Outpatient Prescriptions  Medication Sig Dispense Refill  . atorvastatin (LIPITOR) 10 MG tablet Take 1 tablet by mouth daily.    . Calcium Carbonate-Vitamin D (CALCIUM + D PO) Take 2 tablets by mouth daily. Vitamin D3    . CALCIUM-MAGNESIUM-ZINC PO Take by mouth.    Marland Kitchen  Cholecalciferol (VITAMIN D) 1000 UNITS capsule Take 1,000 Units by mouth daily.      . citalopram (CELEXA) 20 MG tablet Take 20 mg by mouth daily.     Marland Kitchen diltiazem (CARTIA XT) 120 MG 24 hr capsule Take 1 capsule (120 mg total) by mouth daily. 90 capsule 0  . furosemide (LASIX) 40 MG tablet TAKE 1 TABLET TWICE DAILY 180 tablet 0  . gabapentin (NEURONTIN) 100 MG capsule Take 100 mg by mouth 3 (three) times daily.    Marland Kitchen levothyroxine (SYNTHROID, LEVOTHROID) 88 MCG tablet Take 88 mcg by mouth daily.    Marland Kitchen losartan (COZAAR) 50 MG tablet Take 1 tablet (50 mg total) by mouth daily. 30 tablet 1  . metoprolol tartrate (LOPRESSOR) 25 MG tablet Take 0.5 tablets (12.5 mg total) by mouth 2 (two) times daily. 180 tablet 3  . morphine (MSIR) 15 MG tablet Take 15 mg by mouth every 4 (four) hours as needed. pain    . NON FORMULARY daily. 2 liters oxygen    .  rivaroxaban (XARELTO) 20 MG TABS tablet Take 1 tablet (20 mg total) by mouth daily with supper. 30 tablet 6  . temazepam (RESTORIL) 15 MG capsule Take 1 capsule (15 mg total) by mouth at bedtime as needed. sleep 90 capsule 0  . tiotropium (SPIRIVA HANDIHALER) 18 MCG inhalation capsule INHALE THE CONTENTS OF 1 CAPSULE USING HANDIHALER DEVICE ONCE DAILY AS DIRECTED 30 capsule 6  . albuterol (PROVENTIL HFA;VENTOLIN HFA) 108 (90 BASE) MCG/ACT inhaler Inhale 2 puffs into the lungs every 6 (six) hours as needed for wheezing or shortness of breath. 1 Inhaler prn  . mometasone (ELOCON) 0.1 % lotion     . potassium chloride SA (K-DUR,KLOR-CON) 20 MEQ tablet Take 1 tablet (20 mEq total) by mouth 2 (two) times daily. 180 tablet 3   No current facility-administered medications for this visit.    Allergies: Allergies  Allergen Reactions  . Amoxicillin Other (See Comments)    Funny feeling    Social History: The patient  reports that she quit smoking about 13 years ago. Her smoking use included Cigarettes. She has a 40 pack-year smoking history. She has never used smokeless tobacco. She reports that she drinks alcohol. She reports that she does not use illicit drugs.   Family History: The patient's family history includes Diabetes in her mother; Diabetes type II in her mother; Heart attack in her father; Heart disease in her brother, father, and sister; Hypertension in her mother.   Review of Systems: Please see the history of present illness.   Otherwise, the review of systems is positive for chronic back pain.   All other systems are reviewed and negative.   Physical Exam: VS:  BP 128/74 mmHg  Pulse 60  Ht '5\' 2"'$  (1.575 m)  Wt 249 lb (112.946 kg)  BMI 45.53 kg/m2  SpO2 94% .  BMI Body mass index is 45.53 kg/(m^2).  Wt Readings from Last 3 Encounters:  10/13/14 249 lb (112.946 kg)  07/31/14 245 lb (111.131 kg)  07/27/14 247 lb (112.038 kg)    General: Pleasant. Well developed, well  nourished. She is obese. She is in no acute distress.  HEENT: Normal. Neck: Supple, no JVD, carotid bruits, or masses noted.  Cardiac: Regular rate and rhythm. No murmurs, rubs, or gallops. +1chronic edema.  Respiratory:  Lungs are decreased to auscultation bilaterally with normal work of breathing. She has oxygen in place.  GI: Soft and nontender.  MS: No deformity or atrophy.  Gait and ROM intact. Using a walker. Skin: Warm and dry. Color is normal.  Neuro:  Strength and sensation are intact and no gross focal deficits noted.  Psych: Alert, appropriate and with normal affect.   LABORATORY DATA:  EKG:  EKG is not ordered today.   Lab Results  Component Value Date   WBC 6.3 07/27/2014   HGB 14.6 07/27/2014   HCT 43.6 07/27/2014   PLT 192 07/27/2014   GLUCOSE 78 07/27/2014   ALT 15 07/27/2014   AST 19 07/27/2014   NA 136 07/27/2014   K 4.6 07/27/2014   CL 101 07/27/2014   CREATININE 0.85 07/27/2014   BUN 11 07/27/2014   CO2 27 07/27/2014   TSH 0.58 09/27/2013   INR 1.00 01/22/2012    BNP (last 3 results) No results for input(s): BNP in the last 8760 hours.  ProBNP (last 3 results) No results for input(s): PROBNP in the last 8760 hours.   Other Studies Reviewed Today:  Echo Study Conclusions from 2013  - Left ventricle: The cavity size was normal. Wall thickness was normal. Systolic function was normal. The estimated ejection fraction was in the range of 55% to 60%. Features are consistent with a pseudonormal left ventricular filling pattern, with concomitant abnormal relaxation and increased filling pressure (grade 2 diastolic dysfunction). - Mitral valve: Mild regurgitation. - Left atrium: The atrium was moderately dilated. - Pulmonary arteries: PA peak pressure: 79m Hg (S).  Assessment/Plan: 1. Chronic dyspnea - multifactorial given her diastolic HF, COPD, prior lung cancer and pulmonary HTN - on oxygen. Overall seems stable from our standpoint.     2. PAF - she is in sinus by exam today. Remains on her Xarelto - try to help her with samples.   3. Obesity - quite limited in her ability to exercise due to chronic back pain, dyspnea, etc.   4. Thoracic aneurysm - measured at 4.5 in December. I do not think she will be a candidate for intervention if this were to continue to increase.   Current medicines are reviewed with the patient today.  The patient does not have concerns regarding medicines other than what has been noted above.  The following changes have been made:  See above.  Labs/ tests ordered today include:   No orders of the defined types were placed in this encounter.     Disposition:   FU with me in 6 months.   Patient is agreeable to this plan and will call if any problems develop in the interim.   Signed: LBurtis Junes RN, ANP-C 10/13/2014 11:14 AM  [place]SSp[address] Lake[place]olly[place]  [id number]: [phone]: [phone]

## 2014-10-25 ENCOUNTER — Telehealth: Payer: Self-pay

## 2014-10-25 NOTE — Telephone Encounter (Signed)
Patient called for samples of xarelto placed three weeks of samples up front

## 2014-11-22 ENCOUNTER — Other Ambulatory Visit: Payer: Self-pay | Admitting: Internal Medicine

## 2014-11-27 DIAGNOSIS — E785 Hyperlipidemia, unspecified: Secondary | ICD-10-CM | POA: Diagnosis not present

## 2014-11-27 DIAGNOSIS — E039 Hypothyroidism, unspecified: Secondary | ICD-10-CM | POA: Diagnosis not present

## 2014-11-27 DIAGNOSIS — I10 Essential (primary) hypertension: Secondary | ICD-10-CM | POA: Diagnosis not present

## 2014-11-27 DIAGNOSIS — R8290 Unspecified abnormal findings in urine: Secondary | ICD-10-CM | POA: Diagnosis not present

## 2014-11-28 ENCOUNTER — Telehealth: Payer: Self-pay | Admitting: Internal Medicine

## 2014-11-28 ENCOUNTER — Ambulatory Visit (INDEPENDENT_AMBULATORY_CARE_PROVIDER_SITE_OTHER): Payer: Medicare Other | Admitting: Internal Medicine

## 2014-11-28 ENCOUNTER — Encounter: Payer: Self-pay | Admitting: Internal Medicine

## 2014-11-28 VITALS — BP 120/70 | HR 53 | Ht 62.0 in | Wt 247.0 lb

## 2014-11-28 DIAGNOSIS — J9611 Chronic respiratory failure with hypoxia: Secondary | ICD-10-CM | POA: Diagnosis not present

## 2014-11-28 DIAGNOSIS — J432 Centrilobular emphysema: Secondary | ICD-10-CM

## 2014-11-28 NOTE — Progress Notes (Signed)
05/19/11- 49 yoF former smoker followed for COPD and OSA with hx Lung CA/xrt/chemo, DVT, AFlutter LOV- 04/16/10 Has had flu vaccine. Reports coughing up a streak of blood about 2 weeks ago. Chest has been tight, not much cough-mostly with meals. She thinks swallowing triggers her cough. More short of breath than usual. Some reflux. No chest pain or palpitation fever, sweat or enlarged glands. Using Combivent at least once most days has made her feel better. Blows nose occasionally with some blood or green. Denies headache. Continues CPAP at 12 CWP/Advanced/O2 2 L per minute for sleep Had CT scan of chest 05/06/2011 to followup of her aortic aneurysm. Showed emphysema, stable aneurysm, enlarged right lobe of thyroid, coronary disease. Images reviewed.  01/20/12- 72 yoF former smoker followed for COPD and OSA with hx Lung CA/xrt/chemo, DVT, AFlutter Increased SOB x 2 months and getting worse; unable to do many activities Dry cough treated w/ cough drops; denies fever, sweat, chest pain.  Feet and legs stay swollen. + Hx DVT, on ASA- eval by Dr Reynaldo Minium, Dr early. Cardiology has told her "heart is fine'. Dr Jana Hakim has told her no recurrence of lung Ca. She stopped CPAP after bad cold last winter (Advanced). Still sleeping with O2 2L/M  01/29/12-  71 yoF former smoker followed for COPD and OSA with hx Lung CA/xrt/chemo, DVT, AFlutter Post hospital 01/21/12-01/23/12 decompensated OHS/ OSA with fluid overload. Started on home oxygen. Trying CPAP again using nasal pillows. temazepam helps. Has a rescue Combivent but also uses Spiriva sore address this. She very rarely needs her rescue inhaler but we are going  to switch to albuterol Leg vein dopplers 01/21/12-- Neg, although we initially got a telephone report of superficial phlebitis which is when we sent her to the hospital for evaluation. CTa- 01/21/12-  IMPRESSION:  No evidence for pulmonary embolism.  Small right pleural effusion.  Diffuse emphysematous  disease and subtle parenchymal densities in  the lungs. Findings could represent atelectasis or mild edema.  Few areas of volume loss but no focal consolidation.  Stable saccular aneurysm involving descending thoracic aorta.  Stable nodules in the lower neck may be related to thyroid nodules.  Original Report Authenticated By: Markus Daft, M.D.   04/12/12- 89 yoF former smoker followed for COPD and OSA with hx Lung CA/xrt/chemo, DVT, AFlutter Had flu vaccine. Not using CPAP at all. Continues oxygen 2 L/Advanced. Rarely needs albuterol rescue inhaler. Asks about portable oxygen concentrator. No longer on Coumadin for history of DVT. Just using aspirin. COPD assessment test (CAT) score 22/40  10/11/12- 71 yoF former smoker followed for COPD and OSA with hx Lung CA/xrt/chemo, DVT, AFlutter FOLLOWS FOR: Increased SOB with allergies flaring up She says she "got tired of bothering with oxygen" and left it in the car. By the time she got to her exam room today saturation was 83% on room air. infrequent use of rescue inhaler. Advanced O2 2L Not using CPAP. CT chest 05/11/12 ( for Dr Donnetta Hutching) IMPRESSION:  1. Slight increase in size of a 4.5cm saccular aneurysm from the  mid descending thoracic aorta.  2. Bilateral thyroid lesions. Consider further evaluation with  thyroid ultrasound. If patient is clinically hyperthyroid,  consider nuclear medicine thyroid uptake and scan.  05/16/13- 30 yoF former smoker followed for COPD and OSA/ failed CPAP,  with hx Lung CA/xrt/chemo, DVT, AFlutter Dyspnea on exertion. Continues to wear oxygen 2 L/Advanced. Mild hacking cough over the past 6 months. No phlegm. Denies chest pain, fever, sweat, adenopathy. Chest  CT pending tomorrow with Dr. Donnetta Hutching. Spiriva helpful. Using pro-air once or twice daily now.  11/28/13   FOLLOWS FOR: Pt states she is SOB with any abmbulation and with little daily activty. C/o minimal dry cough. Denies CP/tightness.  Continues oxygen 2  L/Advanced. Quit CPAP long ago. No bleeding on Xarelto  05/29/14- 73 yoF former smoker followed for COPD and OSA/ failed CPAP,  with hx Lung CA/xrt/chemo, DVT, AFlutter FOLLOWS FOR: has "good and bad days", c/o sob with exertion.     O2 2L/ Advanced Anoro not helpful. Uses albuterol. Spiriva OK. CT chest 05/23/14 IMPRESSION: Saccular aneurysm of the mid descending thoracic aorta has slightly increased from a maximal diameter of 4.2 cm to 4.5 cm. Stable aorto bi-iliac stent graft without evidence of endoleak. Maximal diameter is 2.9 cm. Electronically Signed  By: Maryclare Bean M.D.  On: 05/23/2014 11:25  11/28/14- 31 yoF former smoker followed for COPD and OSA/ failed CPAP,  with hx Lung CA/xrt/chemo, DVT, AFlutter Reports: Follows For: Pt c/o increased SOB and fatigue, slight wheezing, dry cough. Sinus pressure and drainage.  Denies any chest congestion/tightness.  O2 2 L at night and as needed/ Advanced Using Spiriva and Proventil Office spirometry 11/28/2014-moderate restriction of exhaled volume and moderate obstruction. FVC 1.75/69%, FEV1 1.26/66%, FEV1/FVC 0.72, FEF 25-75 percent 0.77/47%.  ROS-see HPI Constitutional:   No-   weight loss, night sweats, fevers, chills, fatigue, lassitude. HEENT:   No-  headaches, difficulty swallowing, tooth/dental problems, sore throat,       No-  sneezing, itching, ear ache, nasal congestion, post nasal drip,  CV:  No-   chest pain, orthopnea, PND, +less swelling in lower extremities, No-anasarca, dizziness,   +Occasional palpitations Resp:    +shortness of breath with exertion or at rest.              No- productive cough,  +non-productive cough,  no-coughing up of blood.              No-   change in color of mucus.  No- wheezing.   Skin: No-   rash or lesions. GI:  No-   heartburn, indigestion, abdominal pain, nausea, vomiting,  GU: N MS:  No-   joint pain or swelling.   Neuro-     nothing unusual Psych:  No- change in mood or affect. No  depression or anxiety.  No memory loss.  OBJ- Physical Exam     96% on 2 L O2 General- Alert, Oriented, Affect-appropriate, Distress- none, obese. O2 2L/ Advanced Skin- rash-none, lesions- none, excoriation- none Lymphadenopathy- none Head- atraumatic            Eyes- Gross vision intact, PERRLA, conjunctivae and secretions clear            Ears- Hearing, canals-normal            Nose- Clear, no-Septal dev, mucus, polyps, erosion, perforation             Throat- Mallampati II , mucosa clear , drainage- none, tonsils- atrophic, + dentures Neck- flexible , trachea midline, no stridor , thyroid nl, carotid no bruit Chest - symmetrical excursion , unlabored           Heart/CV- IRR ~60 regular, no murmur , no gallop  , no rub, nl s1 s2                           - JVD- none , + trace edema, +  stasis changes, varices- none           Lung- clear, wheeze- none, cough- none , dullness-none, rub- none           Chest wall-  Abd-  Br/ Gen/ Rectal- Not done, not indicated Extrem- cyanosis- none, clubbing, none, atrophy- none, strength- nl,+ Stasis                    changes with heavy legs Neuro- grossly intact to observation

## 2014-11-28 NOTE — Telephone Encounter (Signed)
Order was placed as external as patient informed [name],CMA that she wanted to go elsewhere to have her CXR done. I will need to fax order to the location. Pt will ask to speak with me directly. Thanks.

## 2014-11-28 NOTE — Telephone Encounter (Signed)
LMTCB

## 2014-11-28 NOTE — Telephone Encounter (Signed)
Spoke with CY about this-no rush to get CXR on patient but really needs to go somewhere that the results will flow into EPIC for [place].

## 2014-11-28 NOTE — Telephone Encounter (Signed)
Pt returning call can be reached @ [phone].[name]

## 2014-11-28 NOTE — Patient Instructions (Signed)
Ok to continue present meds  Order- CXR   Dx COPD centrilobular emphysema  Order- Office Spirometry

## 2014-11-29 ENCOUNTER — Ambulatory Visit (INDEPENDENT_AMBULATORY_CARE_PROVIDER_SITE_OTHER)
Admission: RE | Admit: 2014-11-29 | Discharge: 2014-11-29 | Disposition: A | Discharge status: Home / Self Care | Payer: Medicare Other | Source: Ambulatory Visit | Attending: Internal Medicine | Admitting: Internal Medicine

## 2014-11-29 DIAGNOSIS — J432 Centrilobular emphysema: Secondary | ICD-10-CM

## 2014-11-29 DIAGNOSIS — J449 Chronic obstructive pulmonary disease, unspecified: Secondary | ICD-10-CM | POA: Diagnosis not present

## 2014-11-29 NOTE — Telephone Encounter (Signed)
Pt called back-she states she lives within a mile from [facility] location and would like to have CXR done there. [facility] is able to do CXR and order has been placed. Pt is aware as well.

## 2014-11-30 ENCOUNTER — Encounter: Payer: Self-pay | Admitting: *Deleted

## 2014-12-03 ENCOUNTER — Encounter: Payer: Self-pay | Admitting: Internal Medicine

## 2014-12-03 DIAGNOSIS — J9611 Chronic respiratory failure with hypoxia: Secondary | ICD-10-CM

## 2014-12-03 HISTORY — DX: Chronic respiratory failure with hypoxia: J96.11

## 2014-12-03 NOTE — Assessment & Plan Note (Signed)
Continues home oxygen 2 L/Advanced, with sleep and as needed

## 2014-12-03 NOTE — Assessment & Plan Note (Signed)
Fair control without significant acute exacerbation using present meds and oxygen. Plan-chest x-ray

## 2014-12-13 DIAGNOSIS — M25512 Pain in left shoulder: Secondary | ICD-10-CM | POA: Diagnosis not present

## 2014-12-17 ENCOUNTER — Other Ambulatory Visit: Payer: Self-pay | Admitting: Cardiology

## 2015-01-05 ENCOUNTER — Other Ambulatory Visit: Payer: Self-pay

## 2015-01-05 DIAGNOSIS — Z1231 Encounter for screening mammogram for malignant neoplasm of breast: Secondary | ICD-10-CM

## 2015-01-10 DIAGNOSIS — M25512 Pain in left shoulder: Secondary | ICD-10-CM | POA: Diagnosis not present

## 2015-01-18 ENCOUNTER — Other Ambulatory Visit: Payer: Self-pay | Admitting: Internal Medicine

## 2015-01-31 ENCOUNTER — Telehealth: Payer: Self-pay | Admitting: Internal Medicine

## 2015-01-31 MED ORDER — TEMAZEPAM 15 MG PO CAPS: ORAL_CAPSULE | ORAL | Status: DC

## 2015-01-31 NOTE — Telephone Encounter (Signed)
Ok to refill x 6 months 

## 2015-01-31 NOTE — Telephone Encounter (Signed)
Pt is requesting a refill on Temazepam.   Last OV 11/28/14 Pending OV 12/04/15 Last refill 01/18/15 but was never faxed to [facility].  CY - please advise if this is okay to refill. Thanks.

## 2015-01-31 NOTE — Telephone Encounter (Signed)
Rx was printed and signed by CY. It has been faxed to [facility]. Pt is aware. Nothing further was needed.

## 2015-02-02 ENCOUNTER — Telehealth: Payer: Self-pay | Admitting: *Deleted

## 2015-02-02 NOTE — Telephone Encounter (Signed)
Xarelto samples placed at the front desk for patient. 

## 2015-02-13 ENCOUNTER — Other Ambulatory Visit: Payer: Self-pay | Admitting: Cardiology

## 2015-02-13 NOTE — Telephone Encounter (Signed)
REFILL 

## 2015-02-14 ENCOUNTER — Telehealth: Payer: Self-pay | Admitting: Nurse Practitioner

## 2015-02-14 IMAGING — CT CT HEAD W/O CM
2 series · 16 of 30 positions shown, 18 images · non-contrast
Comparison: CT head without and with contrast performed 07/20/2006.

CLINICAL DATA: Headache. Head trauma May 2014. Lung cancer.
Symptoms for at least 4 weeks. Initial encounter.

EXAM:
CT HEAD WITHOUT CONTRAST
TECHNIQUE: Contiguous axial images were obtained from the base of the skull
through the vertex without intravenous contrast.

[Series 3: head bone · axial · 0.49mm/px · z∈[+25,+142]mm · 8 of 56 slices shown]
[im 6/56  bone]
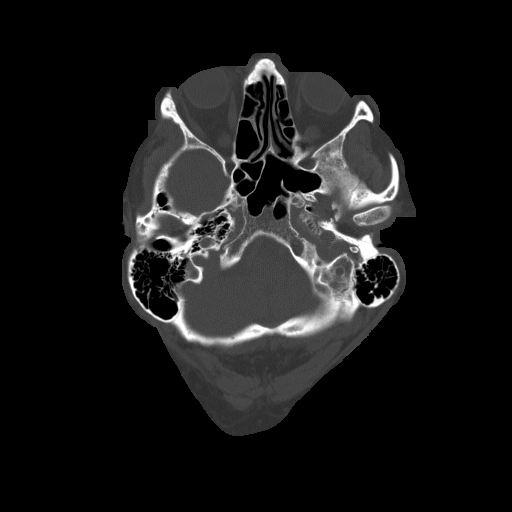
[im 12/56  bone]
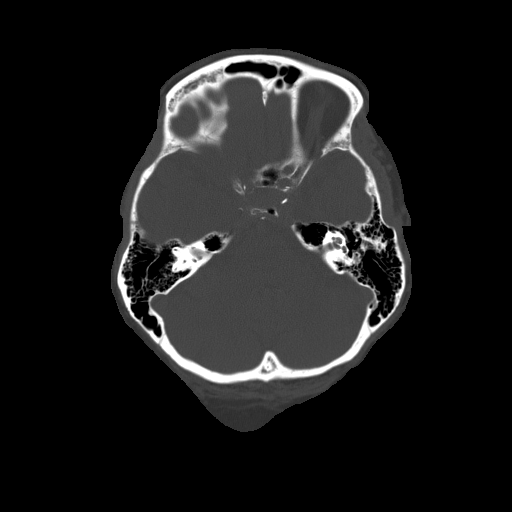
[im 18/56  bone]
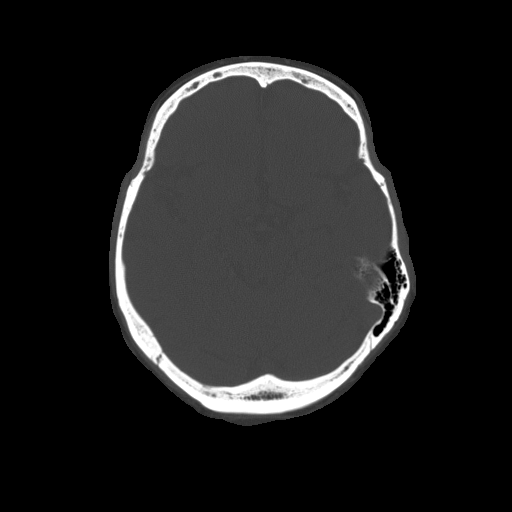
[im 24/56  bone]
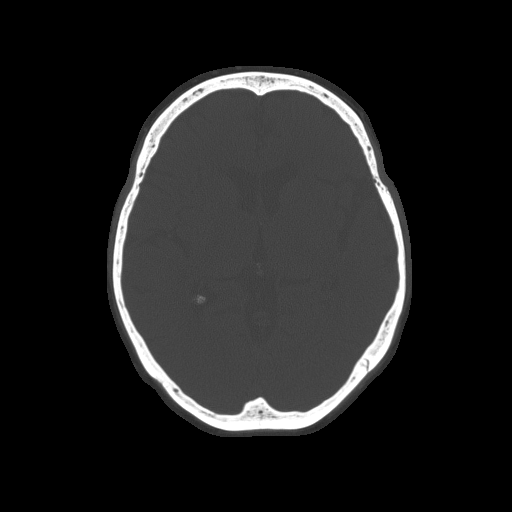
[im 32/56  bone]
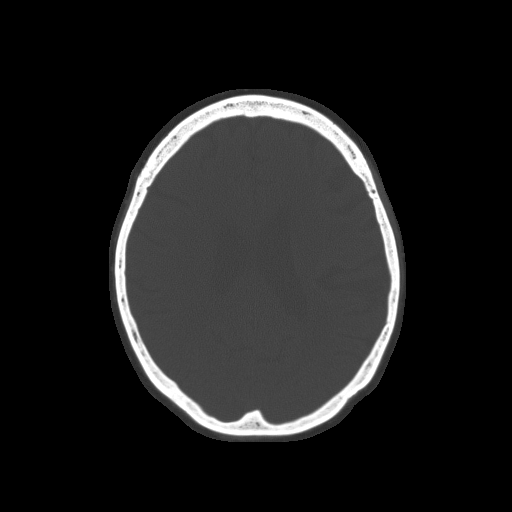
[im 38/56  bone]
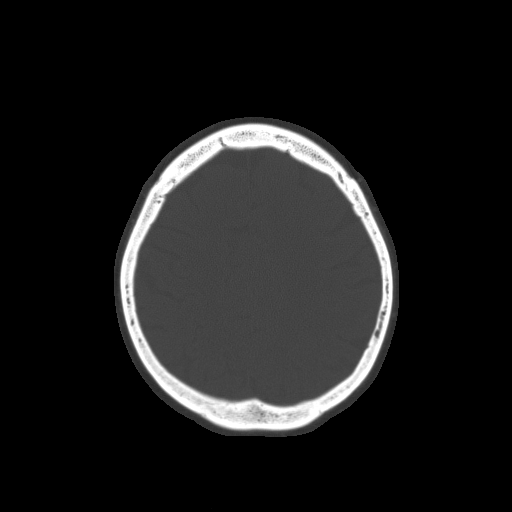
[im 44/56  bone]
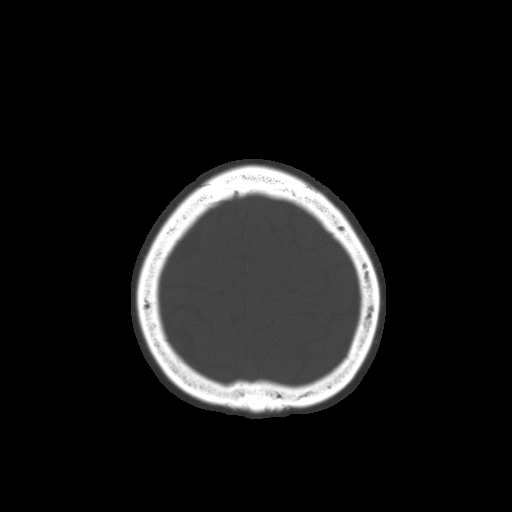
[im 50/56  bone]
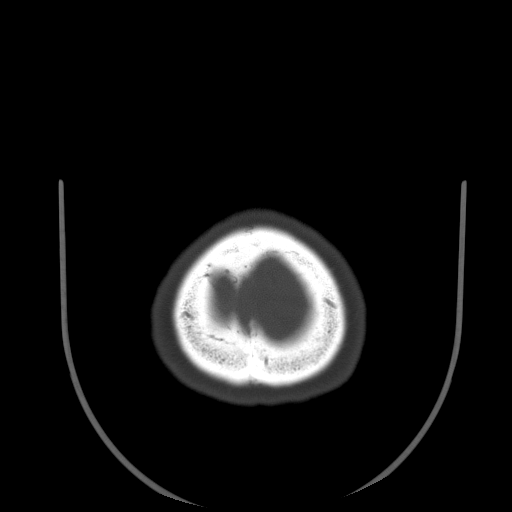

[Series 32: 3d filtered head w/o · axial · non-contrast · 0.49mm/px · z∈[+29,+140]mm · 8 of 28 slices shown, 10 images]
[im 4/28  brain]
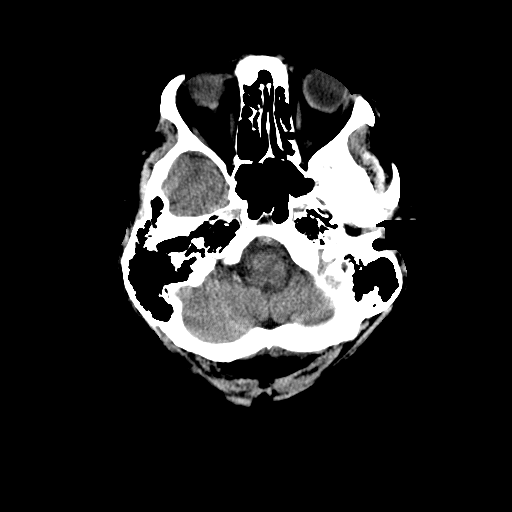
[im 4/28  bone]
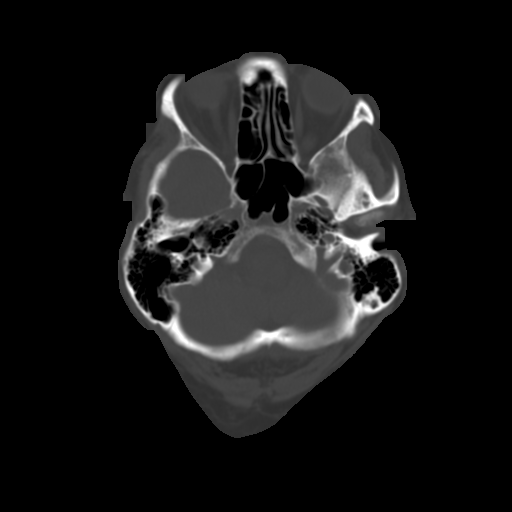
[im 7/28  brain]
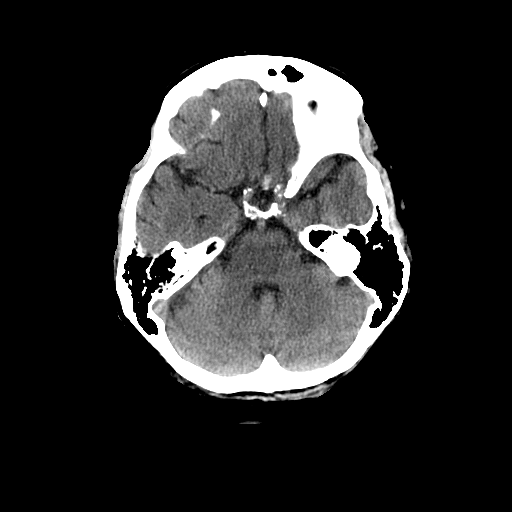
[im 10/28  brain]
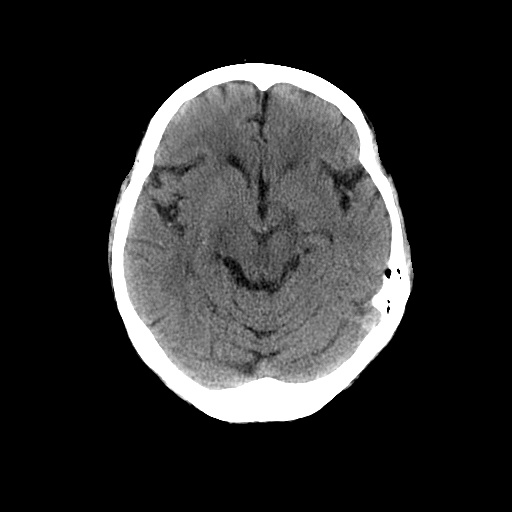
[im 13/28  brain]
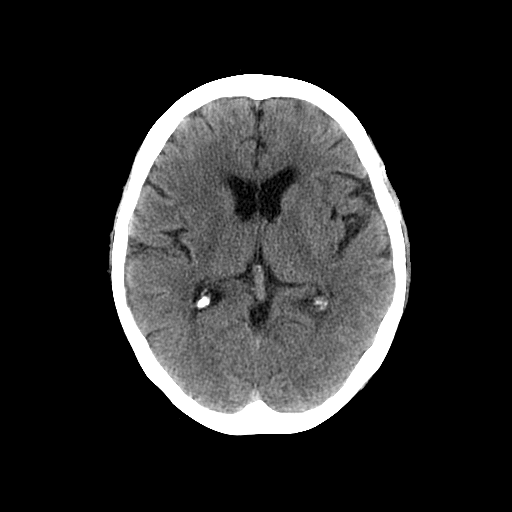
[im 16/28  brain]
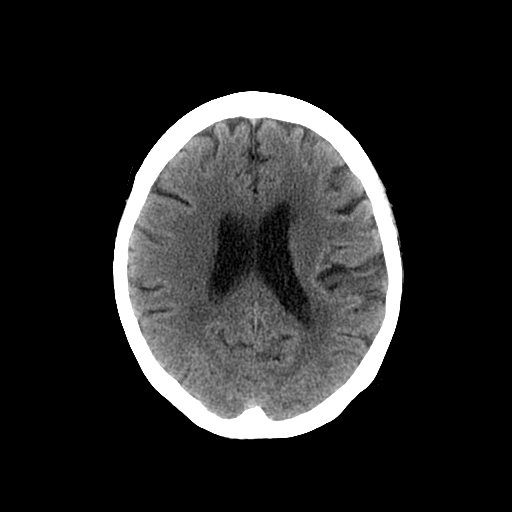
[im 16/28  bone]
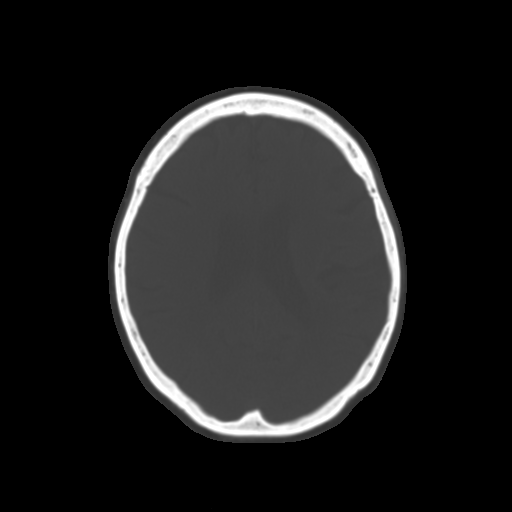
[im 19/28  brain]
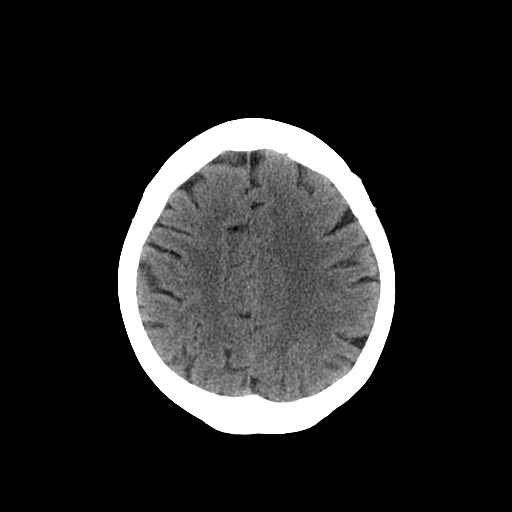
[im 22/28  brain]
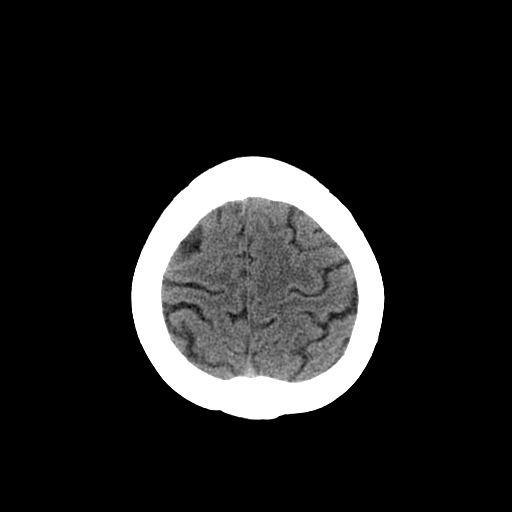
[im 25/28  brain]
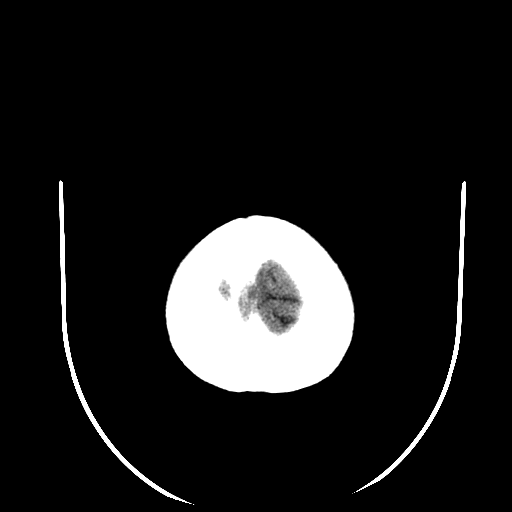

[16 of 30 positions shown; findings below may reference images not displayed]

FINDINGS: No evidence for acute infarction, hemorrhage, mass lesion,
hydrocephalus, or extra-axial fluid. Normal for age cerebral volume.
Asymmetric hypoattenuation of the white matter LEFT parietal
subcortical region, likely chronic microvascular ischemic change.
This has progressed slightly since 5888.

There is advanced vascular calcification in BILATERAL ICA carotid
siphon and supraclinoid segments, without significant involvement of
the posterior circulation.

The calvarium is intact without sclerotic or lytic lesions. No sinus
or mastoid disease.
IMPRESSION: Chronic changes as described. No acute intracranial abnormality. No
posttraumatic sequelae are evident.

Slight progression of small vessel disease since 5888.

Within limits of detection on noncontrast CT, no visible
intracranial metastases.

## 2015-02-14 NOTE — Telephone Encounter (Signed)
error 

## 2015-02-17 ENCOUNTER — Other Ambulatory Visit: Payer: Self-pay | Admitting: Cardiology

## 2015-03-01 ENCOUNTER — Other Ambulatory Visit: Payer: Self-pay | Admitting: Internal Medicine

## 2015-03-05 ENCOUNTER — Telehealth: Payer: Self-pay | Admitting: Internal Medicine

## 2015-03-05 MED ORDER — TIOTROPIUM BROMIDE MONOHYDRATE 18 MCG IN CAPS: ORAL_CAPSULE | RESPIRATORY_TRACT | Status: DC

## 2015-03-05 NOTE — Telephone Encounter (Signed)
RX has been sent in for spiriva. Nothing further needed

## 2015-03-09 ENCOUNTER — Ambulatory Visit: Payer: Medicare Other

## 2015-03-09 ENCOUNTER — Ambulatory Visit
Admission: RE | Admit: 2015-03-09 | Discharge: 2015-03-09 | Disposition: A | Discharge status: Home / Self Care | Payer: Medicare Other | Source: Ambulatory Visit

## 2015-03-09 DIAGNOSIS — Z1231 Encounter for screening mammogram for malignant neoplasm of breast: Secondary | ICD-10-CM

## 2015-04-11 ENCOUNTER — Telehealth: Payer: Self-pay | Admitting: Internal Medicine

## 2015-04-11 NOTE — Telephone Encounter (Signed)
Per CY-have patient come in to see TP. TP is aware of this as well. Pt was placed on TP's schedule for [facility] office at 3:15pm Thursday 04-12-15. Nothing more needed at this time.

## 2015-04-11 NOTE — Telephone Encounter (Signed)
Spoke with the pt  She is c/o increased DOE for the past 2 wks  She is getting out of breath just walking from room to room at home  She states that she has been using spiriva daily, where she had just been using prn  I advised that spiriva is a maintenance med, and she should continue to use every am perfectly regularly  She is using proair 3-4 x per wk on average  She also c/o minimal chest tightness  Requesting to be seen, but there are not any openings  Last ov 11/28/14 Next ov 12/04/15 Allergies  Allergen Reactions  . Amoxicillin Other (See Comments)    Funny feeling   Current Outpatient Prescriptions on File Prior to Visit  Medication Sig Dispense Refill  . atorvastatin (LIPITOR) 10 MG tablet Take 1 tablet by mouth daily.    Marland Kitchen CALCIUM-MAGNESIUM-ZINC PO Take by mouth.    . CARTIA XT 120 MG 24 hr capsule TAKE 1 CAPSULE EVERY DAY 90 capsule 0  . Cholecalciferol (VITAMIN D) 1000 UNITS capsule Take 1,000 Units by mouth daily.      . citalopram (CELEXA) 20 MG tablet Take 20 mg by mouth daily.     . furosemide (LASIX) 40 MG tablet Take 1 tablet (40 mg total) by mouth 2 (two) times daily. 180 tablet 1  . gabapentin (NEURONTIN) 100 MG capsule Take 100 mg by mouth 3 (three) times daily.    Marland Kitchen levothyroxine (SYNTHROID, LEVOTHROID) 88 MCG tablet Take 88 mcg by mouth daily.    Marland Kitchen losartan (COZAAR) 50 MG tablet Take 1 tablet (50 mg total) by mouth daily. 30 tablet 1  . metoprolol tartrate (LOPRESSOR) 25 MG tablet Take 0.5 tablets (12.5 mg total) by mouth 2 (two) times daily. 180 tablet 3  . morphine (MSIR) 15 MG tablet Take 15 mg by mouth every 4 (four) hours as needed. pain    . NON FORMULARY daily. 2 liters oxygen    . potassium chloride SA (K-DUR,KLOR-CON) 20 MEQ tablet Take 1 tablet (20 mEq total) by mouth 2 (two) times daily. 180 tablet 3  . PROAIR HFA 108 (90 BASE) MCG/ACT inhaler INHALE 2 PUFFS INTO THE LUNGS EVERY 6 (SIX)_HOURS AS NEEDED FOR WHEEZING OR SHORTNESS OF BREATH 8.5 g 0  .  rivaroxaban (XARELTO) 20 MG TABS tablet Take 1 tablet (20 mg total) by mouth daily with supper. 30 tablet 6  . temazepam (RESTORIL) 15 MG capsule TAKE 1 CAPSULE AT BEDTIME AS NEEDED FOR SLEEP 90 capsule 1  . tiotropium (SPIRIVA HANDIHALER) 18 MCG inhalation capsule INHALE THE CONTENTS OF 1 CAPSULE USING HANDIHALER DEVICE ONCE DAILY AS DIRECTED 30 capsule 6   No current facility-administered medications on file prior to visit.

## 2015-04-12 ENCOUNTER — Ambulatory Visit (INDEPENDENT_AMBULATORY_CARE_PROVIDER_SITE_OTHER)
Admission: RE | Admit: 2015-04-12 | Discharge: 2015-04-12 | Disposition: A | Discharge status: Home / Self Care | Payer: Medicare Other | Source: Ambulatory Visit | Attending: Adult Health | Admitting: Adult Health

## 2015-04-12 ENCOUNTER — Other Ambulatory Visit (INDEPENDENT_AMBULATORY_CARE_PROVIDER_SITE_OTHER): Payer: Medicare Other

## 2015-04-12 ENCOUNTER — Encounter: Payer: Self-pay | Admitting: Adult Health

## 2015-04-12 ENCOUNTER — Ambulatory Visit (INDEPENDENT_AMBULATORY_CARE_PROVIDER_SITE_OTHER): Payer: Medicare Other | Admitting: Adult Health

## 2015-04-12 VITALS — BP 108/70 | HR 64 | Temp 98.4°F | Ht 62.0 in | Wt 253.6 lb

## 2015-04-12 DIAGNOSIS — J439 Emphysema, unspecified: Secondary | ICD-10-CM | POA: Diagnosis not present

## 2015-04-12 DIAGNOSIS — R0602 Shortness of breath: Secondary | ICD-10-CM

## 2015-04-12 DIAGNOSIS — J441 Chronic obstructive pulmonary disease with (acute) exacerbation: Secondary | ICD-10-CM

## 2015-04-12 DIAGNOSIS — R799 Abnormal finding of blood chemistry, unspecified: Secondary | ICD-10-CM

## 2015-04-12 DIAGNOSIS — E2839 Other primary ovarian failure: Secondary | ICD-10-CM

## 2015-04-12 DIAGNOSIS — J9611 Chronic respiratory failure with hypoxia: Secondary | ICD-10-CM

## 2015-04-12 DIAGNOSIS — R7989 Other specified abnormal findings of blood chemistry: Secondary | ICD-10-CM

## 2015-04-12 LAB — BASIC METABOLIC PANEL
BUN: 16 mg/dL (ref 6–23)
CO2: 33 mEq/L — ABNORMAL HIGH (ref 19–32)
Calcium: 9.6 mg/dL (ref 8.4–10.5)
Chloride: 103 mEq/L (ref 96–112)
Creatinine, Ser: 0.91 mg/dL (ref 0.40–1.20)
GFR: 63.84 mL/min (ref >60.00)
Glucose, Bld: 114 mg/dL — ABNORMAL HIGH (ref 70–99)
Potassium: 4.2 mEq/L (ref 3.5–5.1)
Sodium: 141 mEq/L (ref 135–145)

## 2015-04-12 MED ORDER — PREDNISONE 10 MG PO TABS: ORAL_TABLET | ORAL | Status: DC

## 2015-04-12 NOTE — Addendum Note (Signed)
Addended by: Osa Craver on: 04/12/2015 04:59 PM   Modules accepted: Orders

## 2015-04-12 NOTE — Progress Notes (Signed)
   Subjective:    Patient ID: [name], female    DOB: 12-May-1941, 74 y.o.   MRN: [id number]  HPI 74 yo female former smoker  with COPD on o2 at 2l/m and OSA(CPAP noncompliant) , hx of Lung cancer s/p XRT/Chemo, DVT and A Flutter. (on Xarelto )   04/12/2015 Acute OV  Pt presents for an acute office visit.  She complains of increased SOB, right sided chest tightness, cough, wheezing  PND x 3 weeks.  Gets winded with minimal activity.  On lasix '40mg'$  Twice daily  , sometimes does not take second dose. Legs more swollen in evenings.  Denies any chest congesiton, cough, fever, nausea or vomitting, hemoptysis .  No otc used.  Last cxr showed scarring in June this year.   Review of Systems Constitutional:   No  weight loss, night sweats,  Fevers, chills,  +fatigue, or  lassitude.  HEENT:   No headaches,  Difficulty swallowing,  Tooth/dental problems, or  Sore throat,                No sneezing, itching, ear ache, nasal congestion, post nasal drip,   CV:  No chest pain,  Orthopnea, PND, swelling in lower extremities, anasarca, dizziness, palpitations, syncope.   GI  No heartburn, indigestion, abdominal pain, nausea, vomiting, diarrhea, change in bowel habits, loss of appetite, bloody stools.   Resp: No shortness of breath with exertion or at rest.  No excess mucus, no productive cough,  No non-productive cough,  No coughing up of blood.  No change in color of mucus.  No wheezing.  No chest wall deformity  Skin: no rash or lesions.  GU: no dysuria, change in color of urine, no urgency or frequency.  No flank pain, no hematuria   MS:  No joint pain or swelling.  No decreased range of motion.  No back pain.  Psych:  No change in mood or affect. No depression or anxiety.  No memory loss.         Objective:   Physical Exam GEN: A/Ox3; pleasant , NAD, obese , chronically ill appearing , on O2  HEENT:  [place]/AT,  EACs-clear, TMs-wnl, NOSE-clear, THROAT-clear, no lesions, no postnasal  drip or exudate noted.   NECK:  Supple w/ fair ROM; no JVD; normal carotid impulses w/o bruits; no thyromegaly or nodules palpated; no lymphadenopathy.  RESP  Decreased BS , w/ faint exp wheeze.no accessory muscle use, no dullness to percussion  CARD:  RRR, no m/r/g  , 1+ peripheral edema, pulses intact, no cyanosis or clubbing.  GI:   Soft & nt; nml bowel sounds; no organomegaly or masses detected.  Musco: Warm bil, no deformities or joint swelling noted.   Neuro: alert, no focal deficits noted.    Skin: Warm, no lesions or rashes         Assessment & Plan:

## 2015-04-12 NOTE — Patient Instructions (Signed)
Prednisone taper over next week.  Chest xray today .  Low salt diet follow up Dr. Annamaria Boots  As planned and .As needed   Please contact office for sooner follow up if symptoms do not improve or worsen or seek emergency care

## 2015-04-12 NOTE — Assessment & Plan Note (Signed)
Compensated on o2.

## 2015-04-12 NOTE — Assessment & Plan Note (Signed)
Exacerbation  Check cxr  Check bmet /bnp   Plan  Prednisone taper over next week.  Chest xray today .  Low salt diet follow up Dr. Annamaria Boots  As planned and .As needed   Please contact office for sooner follow up if symptoms do not improve or worsen or seek emergency care

## 2015-04-13 LAB — BRAIN NATRIURETIC PEPTIDE: Pro B Natriuretic peptide (BNP): 311 pg/mL — ABNORMAL HIGH (ref 0.0–100.0)

## 2015-04-17 ENCOUNTER — Ambulatory Visit (INDEPENDENT_AMBULATORY_CARE_PROVIDER_SITE_OTHER): Payer: Medicare Other | Admitting: Nurse Practitioner

## 2015-04-17 ENCOUNTER — Encounter: Payer: Self-pay | Admitting: Nurse Practitioner

## 2015-04-17 VITALS — BP 110/66 | HR 53 | Ht 62.0 in | Wt 248.0 lb

## 2015-04-17 DIAGNOSIS — R0600 Dyspnea, unspecified: Secondary | ICD-10-CM | POA: Diagnosis not present

## 2015-04-17 DIAGNOSIS — I739 Peripheral vascular disease, unspecified: Secondary | ICD-10-CM

## 2015-04-17 DIAGNOSIS — I5032 Chronic diastolic (congestive) heart failure: Secondary | ICD-10-CM | POA: Diagnosis not present

## 2015-04-17 DIAGNOSIS — I779 Disorder of arteries and arterioles, unspecified: Secondary | ICD-10-CM

## 2015-04-17 DIAGNOSIS — I480 Paroxysmal atrial fibrillation: Secondary | ICD-10-CM | POA: Diagnosis not present

## 2015-04-17 DIAGNOSIS — I712 Thoracic aortic aneurysm, without rupture, unspecified: Secondary | ICD-10-CM

## 2015-04-17 MED ORDER — POTASSIUM CHLORIDE CRYS ER 20 MEQ PO TBCR: 20.0000 meq | EXTENDED_RELEASE_TABLET | Freq: Two times a day (BID) | ORAL | Status: DC

## 2015-04-17 NOTE — Patient Instructions (Addendum)
We will be checking the following labs today - NONE   Medication Instructions:    Continue with your current medicines.   I refilled the potassium today.   We will see if we have samples of Xarelto    Testing/Procedures To Be Arranged:  Carotid doppler study  Follow-Up:   See me tentatively in 6 months - sooner if your studies are abnormal    Other Special Instructions:   N/A  Call the [place] office at [phone] if you have any questions, problems or concerns.

## 2015-04-17 NOTE — Progress Notes (Signed)
CARDIOLOGY OFFICE NOTE  Date:  04/17/2015    [name] Date of Birth: 27-Oct-1940 Medical Record #[id number]  PCP:  Geoffery Lyons, MD  Cardiologist:  [place]    Chief Complaint  Patient presents with  . Atrial Fibrillation    6 month check - seen for Dr. [place].  . Shortness of Breath    History of Present Illness: [name] is a 74 y.o. female who presents today for a 6 month check. Seen for Dr. [place]. She has chronic dyspnea. Has had past normal nuclear and echo. Does have moderate pulmonary HTN related to chronic COPD. Former smoker. Has had lung cancer treated with XRT and chemo. Other issues include OSA, past DVT, paroxysmal atrial flutter. She has had prior aorto bi-iliac stent grafting per Dr. Donnetta Hutching. Has a known thoracic aneurysm - last measured in December of 2015 - was 4.2 to 4.5 cm. Had her DVT while on coumadin. Currently on Xarelto. Has had issues with beta blocker and bradycardia in the past - and only able to tolerate low dose.   Last seen back by me back in April - was felt to be stable. Mostly limited by back pain. Chronically short of breath. Labs are checked by Dr. Reynaldo Minium. On oxygen at night.   Comes back today. 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 Continues to do about the same physically but her breathing has gotten worse. Saw Tammy with pulmonary - given short course of steroids. BNP up some but actually improved from last check. CXR looks worse ? Interstitial lung disease. Seeing Dr. Annamaria Boots next week. Remains limited by her back. Has chronic edema. Had cut her lasix back on her own - Tammy increased her back up - she is now taking all of it in the am. No chest pain. No palpitations. No issues with Xarelto.   Past Medical History  Diagnosis Date  . COPD (chronic obstructive pulmonary disease) ([place])   . DVT (deep venous thrombosis) ([place]) 2004    BLE  . AAA (abdominal aortic  aneurysm) ([place])   . Hypothyroidism   . Hyperlipidemia   . History of uterine fibroid   . Hiatal hernia   . History of fibrocystic disease of breast   . HTN (hypertension)   . Anxiety and depression   . PAD (peripheral artery disease) ([place])   . Ovarian mass     right benign  . Anticoagulant long-term use     Failed on Coumadin. On Xarelto  . Atrial fib/flutter, transient June 2012  . Normal nuclear stress test 2012    May 2012  . Esophageal dysmotility   . History of bronchitis   . Exertional dyspnea   . OSA (obstructive sleep apnea)     "haven't been using my CPAP lately" (01/21/12)  . OA (osteoarthritis)     "knees; left shoulder"  . Chronic lower back pain   . Anxiety   . Small cell carcinoma of lung ([place]) 2004    NON-SMALL CELL CARCINOMA OF THE LUNG, METASTATIC TO THE SUPRACLAVICULAR AND MEDIASTINAL LYMPH NODES; in remission  . Ovarian mass     benign, right    Past Surgical History  Procedure Laterality Date  . Abdominal aortic aneurysm repair  ~ 2010    stent graft  . Replacement total knee Left ~ 2008    left  . Thyroidectomy  ~ 1964  . Bladder  surgery  ~ 2003    sling  . Cardiovascular stress test  2012    No ischemia  . Breast biopsy  1960's    both breast's - benign  . Cataract extraction w/ intraocular lens  implant, bilateral Bilateral ~ 2009     Medications: Current Outpatient Prescriptions  Medication Sig Dispense Refill  . atorvastatin (LIPITOR) 10 MG tablet Take 1 tablet by mouth daily.    Marland Kitchen CALCIUM-MAGNESIUM-ZINC PO Take by mouth.    . CARTIA XT 120 MG 24 hr capsule TAKE 1 CAPSULE EVERY DAY 90 capsule 0  . Cholecalciferol (VITAMIN D) 1000 UNITS capsule Take 1,000 Units by mouth daily.      . citalopram (CELEXA) 20 MG tablet Take 20 mg by mouth daily.     . furosemide (LASIX) 40 MG tablet Take 1 tablet (40 mg total) by mouth 2 (two) times daily. 180 tablet 1  . levothyroxine (SYNTHROID, LEVOTHROID) 88 MCG tablet Take 88 mcg by mouth daily.    Marland Kitchen  losartan (COZAAR) 50 MG tablet Take 1 tablet (50 mg total) by mouth daily. 30 tablet 1  . metoprolol tartrate (LOPRESSOR) 25 MG tablet Take 0.5 tablets (12.5 mg total) by mouth 2 (two) times daily. 180 tablet 3  . morphine (MSIR) 15 MG tablet Take 15 mg by mouth every 4 (four) hours as needed. pain    . NON FORMULARY daily. 2 liters oxygen    . PROAIR HFA 108 (90 BASE) MCG/ACT inhaler INHALE 2 PUFFS INTO THE LUNGS EVERY 6 (SIX)_HOURS AS NEEDED FOR WHEEZING OR SHORTNESS OF BREATH 8.5 g 0  . rivaroxaban (XARELTO) 20 MG TABS tablet Take 1 tablet (20 mg total) by mouth daily with supper. 30 tablet 6  . temazepam (RESTORIL) 15 MG capsule TAKE 1 CAPSULE AT BEDTIME AS NEEDED FOR SLEEP 90 capsule 1  . tiotropium (SPIRIVA HANDIHALER) 18 MCG inhalation capsule INHALE THE CONTENTS OF 1 CAPSULE USING HANDIHALER DEVICE ONCE DAILY AS DIRECTED 30 capsule 6  . potassium chloride SA (K-DUR,KLOR-CON) 20 MEQ tablet Take 1 tablet (20 mEq total) by mouth 2 (two) times daily. 180 tablet 3   No current facility-administered medications for this visit.    Allergies: No Active Allergies  Social History: The patient  reports that she quit smoking about 13 years ago. Her smoking use included Cigarettes. She has a 40 pack-year smoking history. She has never used smokeless tobacco. She reports that she drinks alcohol. She reports that she does not use illicit drugs.   Family History: The patient's family history includes Diabetes in her mother; Diabetes type II in her mother; Heart attack in her father; Heart disease in her brother, father, and sister; Hypertension in her mother.   Review of Systems: Please see the history of present illness.   Otherwise, the review of systems is positive for leg swelling, DOE, back pain, muscle pain, easy bruising, fatigue, wheezing, joint swelling and balance issues.   All other systems are reviewed and negative.   Physical Exam: VS:  BP 110/66 mmHg  Pulse 53  Ht '5\' 2"'$  (1.575 m)   Wt 248 lb (112.492 kg)  BMI 45.35 kg/m2 .  BMI Body mass index is 45.35 kg/(m^2).  Wt Readings from Last 3 Encounters:  04/17/15 248 lb (112.492 kg)  04/12/15 253 lb 9.6 oz (115.032 kg)  11/28/14 247 lb (112.038 kg)    General: Pleasant. Obese female. Short of breath with walking but in no acute distress.  HEENT: Normal. Neck: Supple, no  JVD, carotid bruits, or masses noted.  Cardiac: Regular rate and rhythm. Heart tones distant. +2 edema.  Respiratory:  Lungs are coarse but with normal work of breathing.  GI: Soft and nontender.  MS: No deformity or atrophy. Gait and ROM intact. Using a walker.  Skin: Warm and dry. Color is normal.  Neuro:  Strength and sensation are intact and no gross focal deficits noted.  Psych: Alert, appropriate and with normal affect.   LABORATORY DATA:  EKG:  EKG is ordered today. This demonstrates NSR with RSR'.  Lab Results  Component Value Date   WBC 6.3 07/27/2014   HGB 14.6 07/27/2014   HCT 43.6 07/27/2014   PLT 192 07/27/2014   GLUCOSE 114* 04/12/2015   ALT 15 07/27/2014   AST 19 07/27/2014   NA 141 04/12/2015   K 4.2 04/12/2015   CL 103 04/12/2015   CREATININE 0.91 04/12/2015   BUN 16 04/12/2015   CO2 33* 04/12/2015   TSH 0.58 09/27/2013   INR 1.00 01/22/2012    BNP (last 3 results) No results for input(s): BNP in the last 8760 hours.  ProBNP (last 3 results)  Recent Labs  04/12/15 1720  PROBNP 311.0*     Other Studies Reviewed Today:   CXR IMPRESSION: Severe diffuse interstitial change, which represents a change from 11/29/2014. Differential diagnostic possibilities include atypical pneumonia. Pulmonary edema appears to be on likely in the absence of vascular congestion or pleural effusion. Lymphangitic spread of tumor is another possibility that is considered less likely.   Electronically Signed  By: Skipper Cliche M.D.  On: 04/12/2015 17:39    Echo Study Conclusions from 2013  - Left ventricle: The  cavity size was normal. Wall thickness was normal. Systolic function was normal. The estimated ejection fraction was in the range of 55% to 60%. Features are consistent with a pseudonormal left ventricular filling pattern, with concomitant abnormal relaxation and increased filling pressure (grade 2 diastolic dysfunction). - Mitral valve: Mild regurgitation. - Left atrium: The atrium was moderately dilated. - Pulmonary arteries: PA peak pressure: 39m Hg (S).  Assessment/Plan: 1. Chronic dyspnea - multifactorial given her diastolic HF, COPD, prior lung cancer and pulmonary HTN - on oxygen at night. Will get her echo updated. Seeing Dr. YAnnamaria Bootsnext week - may need to get CT - will defer to him.   2. PAF - she is in sinus by exam and EKG today. Remains on her Xarelto - try to help her with samples.   3. Obesity - quite limited in her ability to exercise due to chronic back pain, dyspnea, etc.   4. Thoracic aneurysm - measured at 4.5 in December. I do not think she will be a candidate for intervention if this were to continue to increase. She is followed yearly by Dr. EDonnetta Hutching   5. Carotid disease - got a recall letter - will get updated.    Current medicines are reviewed with the patient today.  The patient does not have concerns regarding medicines other than what has been noted above.  The following changes have been made:  See above.  Labs/ tests ordered today include:    Orders Placed This Encounter  Procedures  . EKG 12-Lead  . ECHOCARDIOGRAM COMPLETE     Disposition:   FU with me in 6 months.   Patient is agreeable to this plan and will call if any problems develop in the interim.   Signed: LBurtis Junes RN, ANP-C 04/17/2015 11:43 AM  [facility] [address] [place], [place]  [address] Phone: [phone] Fax: [phone]

## 2015-04-18 ENCOUNTER — Ambulatory Visit (HOSPITAL_COMMUNITY)
Admission: RE | Admit: 2015-04-18 | Discharge: 2015-04-18 | Disposition: A | Discharge status: Home / Self Care | Payer: Medicare Other | Source: Ambulatory Visit | Attending: Cardiology | Admitting: Cardiology

## 2015-04-18 DIAGNOSIS — I739 Peripheral vascular disease, unspecified: Secondary | ICD-10-CM

## 2015-04-18 DIAGNOSIS — I6523 Occlusion and stenosis of bilateral carotid arteries: Secondary | ICD-10-CM | POA: Diagnosis not present

## 2015-04-18 DIAGNOSIS — I779 Disorder of arteries and arterioles, unspecified: Secondary | ICD-10-CM | POA: Diagnosis not present

## 2015-04-18 DIAGNOSIS — I10 Essential (primary) hypertension: Secondary | ICD-10-CM | POA: Diagnosis not present

## 2015-04-18 DIAGNOSIS — R0600 Dyspnea, unspecified: Secondary | ICD-10-CM

## 2015-04-18 DIAGNOSIS — I5032 Chronic diastolic (congestive) heart failure: Secondary | ICD-10-CM

## 2015-04-18 DIAGNOSIS — I712 Thoracic aortic aneurysm, without rupture, unspecified: Secondary | ICD-10-CM

## 2015-04-18 DIAGNOSIS — G4733 Obstructive sleep apnea (adult) (pediatric): Secondary | ICD-10-CM | POA: Diagnosis not present

## 2015-04-18 DIAGNOSIS — E785 Hyperlipidemia, unspecified: Secondary | ICD-10-CM | POA: Insufficient documentation

## 2015-04-18 DIAGNOSIS — I480 Paroxysmal atrial fibrillation: Secondary | ICD-10-CM

## 2015-04-23 ENCOUNTER — Other Ambulatory Visit: Payer: Self-pay

## 2015-04-23 ENCOUNTER — Ambulatory Visit (HOSPITAL_COMMUNITY): Payer: Medicare Other | Attending: Internal Medicine

## 2015-04-23 DIAGNOSIS — E785 Hyperlipidemia, unspecified: Secondary | ICD-10-CM | POA: Insufficient documentation

## 2015-04-23 DIAGNOSIS — Z8249 Family history of ischemic heart disease and other diseases of the circulatory system: Secondary | ICD-10-CM | POA: Insufficient documentation

## 2015-04-23 DIAGNOSIS — I712 Thoracic aortic aneurysm, without rupture, unspecified: Secondary | ICD-10-CM

## 2015-04-23 DIAGNOSIS — I4891 Unspecified atrial fibrillation: Secondary | ICD-10-CM | POA: Insufficient documentation

## 2015-04-23 DIAGNOSIS — I480 Paroxysmal atrial fibrillation: Secondary | ICD-10-CM

## 2015-04-23 DIAGNOSIS — I5032 Chronic diastolic (congestive) heart failure: Secondary | ICD-10-CM | POA: Diagnosis not present

## 2015-04-23 DIAGNOSIS — I071 Rheumatic tricuspid insufficiency: Secondary | ICD-10-CM | POA: Diagnosis not present

## 2015-04-23 DIAGNOSIS — I10 Essential (primary) hypertension: Secondary | ICD-10-CM | POA: Insufficient documentation

## 2015-04-23 DIAGNOSIS — R0600 Dyspnea, unspecified: Secondary | ICD-10-CM | POA: Diagnosis not present

## 2015-04-23 DIAGNOSIS — I739 Peripheral vascular disease, unspecified: Secondary | ICD-10-CM

## 2015-04-23 DIAGNOSIS — I779 Disorder of arteries and arterioles, unspecified: Secondary | ICD-10-CM

## 2015-04-23 DIAGNOSIS — I517 Cardiomegaly: Secondary | ICD-10-CM | POA: Insufficient documentation

## 2015-04-24 ENCOUNTER — Encounter: Payer: Self-pay | Admitting: Internal Medicine

## 2015-04-24 ENCOUNTER — Ambulatory Visit (INDEPENDENT_AMBULATORY_CARE_PROVIDER_SITE_OTHER): Payer: Medicare Other | Admitting: Internal Medicine

## 2015-04-24 ENCOUNTER — Ambulatory Visit (INDEPENDENT_AMBULATORY_CARE_PROVIDER_SITE_OTHER)
Admission: RE | Admit: 2015-04-24 | Discharge: 2015-04-24 | Disposition: A | Discharge status: Home / Self Care | Payer: Medicare Other | Source: Ambulatory Visit | Attending: Internal Medicine | Admitting: Internal Medicine

## 2015-04-24 ENCOUNTER — Telehealth: Payer: Self-pay | Admitting: Internal Medicine

## 2015-04-24 ENCOUNTER — Telehealth: Payer: Self-pay | Admitting: *Deleted

## 2015-04-24 VITALS — BP 112/66 | HR 57 | Ht 62.0 in | Wt 256.8 lb

## 2015-04-24 DIAGNOSIS — J439 Emphysema, unspecified: Secondary | ICD-10-CM | POA: Diagnosis not present

## 2015-04-24 DIAGNOSIS — J9611 Chronic respiratory failure with hypoxia: Secondary | ICD-10-CM | POA: Diagnosis not present

## 2015-04-24 DIAGNOSIS — Z23 Encounter for immunization: Secondary | ICD-10-CM

## 2015-04-24 DIAGNOSIS — J810 Acute pulmonary edema: Secondary | ICD-10-CM | POA: Diagnosis not present

## 2015-04-24 DIAGNOSIS — G4733 Obstructive sleep apnea (adult) (pediatric): Secondary | ICD-10-CM

## 2015-04-24 DIAGNOSIS — I509 Heart failure, unspecified: Secondary | ICD-10-CM | POA: Diagnosis not present

## 2015-04-24 DIAGNOSIS — I5030 Unspecified diastolic (congestive) heart failure: Secondary | ICD-10-CM

## 2015-04-24 NOTE — Telephone Encounter (Signed)
-----   Message from Burtis Junes, NP sent at 04/24/2015  7:56 AM EDT ----- Ok to report. Her echo looks ok. Still has good pumping function of her heart. Does have some stiffness of her heart - this will make her prone to swelling/dyspnea. Needs salt restriction, daily weights, good BP control and continue with current medicines.

## 2015-04-24 NOTE — Assessment & Plan Note (Signed)
We couldn't motivate her to stay compliant with CPAP and she has not tried to lose weight.

## 2015-04-24 NOTE — Telephone Encounter (Signed)
Error

## 2015-04-24 NOTE — Telephone Encounter (Signed)
Called the pt and informed her of the ECHO results.  Pt verbalized understanding.  Also, Pt states that her repeat chest xray showed some congestive heart failure.  Dr. Annamaria Boots had her repeat this and this was the results.  She is wanting to know if you can look at her xray and see if you see anything that's concerning?  Please advise!

## 2015-04-24 NOTE — Telephone Encounter (Signed)
CXR with mild CHF. She is going to need to take her Lasix as prescribed and not try to "cut back". Her echo did show stiffness of her heart which will make her prone to holding onto fluid and having "heart failure".   Has to weigh daily, avoid salt, take diuretics as prescribed.

## 2015-04-24 NOTE — Assessment & Plan Note (Signed)
Remains dependent on oxygen, used for sleep, as convenient around the house, and with exertion

## 2015-04-24 NOTE — Progress Notes (Signed)
05/19/11- 49 yoF former smoker followed for COPD and OSA with hx Lung CA/xrt/chemo, DVT, AFlutter LOV- 04/16/10 Has had flu vaccine. Reports coughing up a streak of blood about 2 weeks ago. Chest has been tight, not much cough-mostly with meals. She thinks swallowing triggers her cough. More short of breath than usual. Some reflux. No chest pain or palpitation fever, sweat or enlarged glands. Using Combivent at least once most days has made her feel better. Blows nose occasionally with some blood or green. Denies headache. Continues CPAP at 12 CWP/Advanced/O2 2 L per minute for sleep Had CT scan of chest 05/06/2011 to followup of her aortic aneurysm. Showed emphysema, stable aneurysm, enlarged right lobe of thyroid, coronary disease. Images reviewed.  01/20/12- 72 yoF former smoker followed for COPD and OSA with hx Lung CA/xrt/chemo, DVT, AFlutter Increased SOB x 2 months and getting worse; unable to do many activities Dry cough treated w/ cough drops; denies fever, sweat, chest pain.  Feet and legs stay swollen. + Hx DVT, on ASA- eval by Dr Reynaldo Minium, Dr early. Cardiology has told her "heart is fine'. Dr Jana Hakim has told her no recurrence of lung Ca. She stopped CPAP after bad cold last winter (Advanced). Still sleeping with O2 2L/M  01/29/12-  71 yoF former smoker followed for COPD and OSA with hx Lung CA/xrt/chemo, DVT, AFlutter Post hospital 01/21/12-01/23/12 decompensated OHS/ OSA with fluid overload. Started on home oxygen. Trying CPAP again using nasal pillows. temazepam helps. Has a rescue Combivent but also uses Spiriva sore address this. She very rarely needs her rescue inhaler but we are going  to switch to albuterol Leg vein dopplers 01/21/12-- Neg, although we initially got a telephone report of superficial phlebitis which is when we sent her to the hospital for evaluation. CTa- 01/21/12-  IMPRESSION:  No evidence for pulmonary embolism.  Small right pleural effusion.  Diffuse emphysematous  disease and subtle parenchymal densities in  the lungs. Findings could represent atelectasis or mild edema.  Few areas of volume loss but no focal consolidation.  Stable saccular aneurysm involving descending thoracic aorta.  Stable nodules in the lower neck may be related to thyroid nodules.  Original Report Authenticated By: Markus Daft, M.D.   04/12/12- 89 yoF former smoker followed for COPD and OSA with hx Lung CA/xrt/chemo, DVT, AFlutter Had flu vaccine. Not using CPAP at all. Continues oxygen 2 L/Advanced. Rarely needs albuterol rescue inhaler. Asks about portable oxygen concentrator. No longer on Coumadin for history of DVT. Just using aspirin. COPD assessment test (CAT) score 22/40  10/11/12- 71 yoF former smoker followed for COPD and OSA with hx Lung CA/xrt/chemo, DVT, AFlutter FOLLOWS FOR: Increased SOB with allergies flaring up She says she "got tired of bothering with oxygen" and left it in the car. By the time she got to her exam room today saturation was 83% on room air. infrequent use of rescue inhaler. Advanced O2 2L Not using CPAP. CT chest 05/11/12 ( for Dr Donnetta Hutching) IMPRESSION:  1. Slight increase in size of a 4.5cm saccular aneurysm from the  mid descending thoracic aorta.  2. Bilateral thyroid lesions. Consider further evaluation with  thyroid ultrasound. If patient is clinically hyperthyroid,  consider nuclear medicine thyroid uptake and scan.  05/16/13- 30 yoF former smoker followed for COPD and OSA/ failed CPAP,  with hx Lung CA/xrt/chemo, DVT, AFlutter Dyspnea on exertion. Continues to wear oxygen 2 L/Advanced. Mild hacking cough over the past 6 months. No phlegm. Denies chest pain, fever, sweat, adenopathy. Chest  CT pending tomorrow with Dr. Donnetta Hutching. Spiriva helpful. Using pro-air once or twice daily now.  11/28/13   FOLLOWS FOR: Pt states she is SOB with any abmbulation and with little daily activty. C/o minimal dry cough. Denies CP/tightness.  Continues oxygen 2  L/Advanced. Quit CPAP long ago. No bleeding on Xarelto  05/29/14- 73 yoF former smoker followed for COPD and OSA/ failed CPAP,  with hx Lung CA/xrt/chemo, DVT, AFlutter FOLLOWS FOR: has "good and bad days", c/o sob with exertion.     O2 2L/ Advanced Anoro not helpful. Uses albuterol. Spiriva OK. CT chest 05/23/14 IMPRESSION: Saccular aneurysm of the mid descending thoracic aorta has slightly increased from a maximal diameter of 4.2 cm to 4.5 cm. Stable aorto bi-iliac stent graft without evidence of endoleak. Maximal diameter is 2.9 cm. Electronically Signed  By: Maryclare Bean M.D.  On: 05/23/2014 11:25  11/28/14- 7 yoF former smoker followed for COPD and OSA/ failed CPAP,  with hx Lung CA/xrt/chemo, DVT, AFlutter Reports: Follows For: Pt c/o increased SOB and fatigue, slight wheezing, dry cough. Sinus pressure and drainage.  Denies any chest congestion/tightness.  O2 2 L at night and as needed/ Advanced Using Spiriva and Proventil Office spirometry 11/28/2014-moderate restriction of exhaled volume and moderate obstruction. FVC 1.75/69%, FEV1 1.26/66%, FEV1/FVC 0.72, FEF 25-75 percent 0.77/47%.  04/12/15- 04/12/2015 Acute OV  Pt presents for an acute office visit.  She complains of increased SOB, right sided chest tightness, cough, wheezing PND x 3 weeks.  Gets winded with minimal activity.  On lasix '40mg'$  Twice daily , sometimes does not take second dose. Legs more swollen in evenings.  Denies any chest congesiton, cough, fever, nausea or vomitting, hemoptysis .  No otc used.  Last cxr showed scarring in June this year.   04/24/15- 36 yoF former smoker followed for COPD and OSA/ failed CPAP,  with hx Lung CA/xrt/chemo, DVT, AFlutter FOLLOWS FOR: seen by TP on 04-12-15; pt states she feels better-SOB still there but chest tightness has gone away. O2 2 L at night and as needed/ Advanced Still aware of shortness of breath she continues oxygen at 2 L. Admits she sleeps Lasix off  anytime she is going out. Legs remain swollen. Less chest congestion then at last visit here in October. Pending CT chest following atherosclerosis in her aorta. Chest x-ray images reviewed together CXR 04/12/15  IMPRESSION: Severe diffuse interstitial change, which represents a change from 11/29/2014. Differential diagnostic possibilities include atypical pneumonia. Pulmonary edema appears to be on likely in the absence of vascular congestion or pleural effusion. Lymphangitic spread of tumor is another possibility that is considered less likely. Electronically Signed  By: Skipper Cliche M.D.  On: 04/12/2015 17:39  ROS-see HPI Constitutional:   No-   weight loss, night sweats, fevers, chills, fatigue, lassitude. HEENT:   No-  headaches, difficulty swallowing, tooth/dental problems, sore throat,       No-  sneezing, itching, ear ache, nasal congestion, post nasal drip,  CV:  No-   chest pain, orthopnea, PND, +less swelling in lower extremities, No-anasarca, dizziness,                     +Occasional palpitations Resp:    +shortness of breath with exertion or at rest.              No- productive cough,  +non-productive cough,  no-coughing up of blood.              No-   change in  color of mucus.  No- wheezing.   Skin: No-   rash or lesions. GI:  No-   heartburn, indigestion, abdominal pain, nausea, vomiting,  GU: N MS:  No-   joint pain or swelling.   Neuro-     nothing unusual Psych:  No- change in mood or affect. No depression or anxiety.  No memory loss.  OBJ- Physical Exam   General- Alert, Oriented, Affect-appropriate, Distress- none, + obese. O2 2L/ Advanced Skin- rash-none, lesions- none, excoriation- none Lymphadenopathy- none Head- atraumatic            Eyes- Gross vision intact, PERRLA, conjunctivae and secretions clear            Ears- Hearing, canals-normal            Nose- Clear, no-Septal dev, mucus, polyps, erosion, perforation             Throat- Mallampati II ,  mucosa clear , drainage- none, tonsils- atrophic, + dentures Neck- flexible , trachea midline, no stridor , thyroid nl, carotid no bruit Chest - symmetrical excursion , unlabored           Heart/CV- IRR ~60 regular, no murmur , no gallop  , no rub, nl s1 s2                           - JVD- none ,  edema +2 chronic, + stasis changes, varices- none           Lung- clear, wheeze- none, cough- none , dullness-none, rub- none           Chest wall-  Abd-  Br/ Gen/ Rectal- Not done, not indicated Extrem- cyanosis- none, clubbing, none, atrophy- none, strength- nl,+ Stasis changes with heavy legs Neuro- grossly intact to observation

## 2015-04-24 NOTE — Assessment & Plan Note (Signed)
Chest x-ray looks like interstitial edema. An underlying additional interstitial diagnosis can't be ruled out. Biggest problem is her motivation take diuretics which are inconvenient because of polyuria. Her obesity puts her at risk for OHS

## 2015-04-24 NOTE — Patient Instructions (Signed)
Order- CXR   COPD mixed type, question interstitial disease vs edema  Flu vax   Anything you can do to lose some weight would really help you

## 2015-05-03 DIAGNOSIS — G8929 Other chronic pain: Secondary | ICD-10-CM | POA: Diagnosis not present

## 2015-05-03 DIAGNOSIS — M5136 Other intervertebral disc degeneration, lumbar region: Secondary | ICD-10-CM | POA: Diagnosis not present

## 2015-05-03 DIAGNOSIS — M25512 Pain in left shoulder: Secondary | ICD-10-CM | POA: Diagnosis not present

## 2015-05-03 DIAGNOSIS — G894 Chronic pain syndrome: Secondary | ICD-10-CM | POA: Diagnosis not present

## 2015-05-03 NOTE — Telephone Encounter (Signed)
[name] returned my call and she was advised that her CXR showed mold CHF and that she needed to take her diuretics as prescribed and to stay away from salt as much as she can, that her ECHO showed some stiffness in her heart and that makes her more vulnerable to holding fluid and that it was very important for her to take her Lasix.  Pt verbalized understanding

## 2015-05-04 ENCOUNTER — Other Ambulatory Visit: Payer: Self-pay | Admitting: Cardiology

## 2015-05-07 DIAGNOSIS — H53002 Unspecified amblyopia, left eye: Secondary | ICD-10-CM | POA: Diagnosis not present

## 2015-05-07 DIAGNOSIS — H26493 Other secondary cataract, bilateral: Secondary | ICD-10-CM | POA: Diagnosis not present

## 2015-05-23 ENCOUNTER — Other Ambulatory Visit: Payer: Self-pay | Admitting: Vascular Surgery

## 2015-05-23 DIAGNOSIS — Z01812 Encounter for preprocedural laboratory examination: Secondary | ICD-10-CM | POA: Diagnosis not present

## 2015-05-23 LAB — CREATININE, SERUM: Creat: 0.76 mg/dL (ref 0.60–0.93)

## 2015-05-23 LAB — BUN: BUN: 11 mg/dL (ref 7–25)

## 2015-05-23 LAB — BUN+CREAT: BUN/Creatinine Ratio: 14.5 Ratio (ref 6–22)

## 2015-05-24 DIAGNOSIS — H26491 Other secondary cataract, right eye: Secondary | ICD-10-CM | POA: Diagnosis not present

## 2015-05-28 ENCOUNTER — Other Ambulatory Visit: Payer: Self-pay | Admitting: Cardiology

## 2015-05-29 ENCOUNTER — Ambulatory Visit
Admission: RE | Admit: 2015-05-29 | Discharge: 2015-05-29 | Disposition: A | Discharge status: Home / Self Care | Payer: Medicare Other | Source: Ambulatory Visit | Attending: Vascular Surgery | Admitting: Vascular Surgery

## 2015-05-29 ENCOUNTER — Ambulatory Visit: Payer: Medicare Other | Admitting: Vascular Surgery

## 2015-05-29 DIAGNOSIS — I712 Thoracic aortic aneurysm, without rupture, unspecified: Secondary | ICD-10-CM

## 2015-05-29 DIAGNOSIS — I714 Abdominal aortic aneurysm, without rupture, unspecified: Secondary | ICD-10-CM

## 2015-05-29 DIAGNOSIS — Z48812 Encounter for surgical aftercare following surgery on the circulatory system: Secondary | ICD-10-CM

## 2015-05-29 DIAGNOSIS — Z9889 Other specified postprocedural states: Secondary | ICD-10-CM | POA: Diagnosis not present

## 2015-05-29 MED ORDER — IOPAMIDOL (ISOVUE-370) INJECTION 76%
80.0000 mL | Freq: Once | INTRAVENOUS | Status: AC | PRN
  Administered 2015-05-29: 80 mL via INTRAVENOUS

## 2015-05-30 ENCOUNTER — Encounter: Payer: Self-pay | Admitting: Vascular Surgery

## 2015-05-31 DIAGNOSIS — H26492 Other secondary cataract, left eye: Secondary | ICD-10-CM | POA: Diagnosis not present

## 2015-06-05 ENCOUNTER — Telehealth: Payer: Self-pay

## 2015-06-05 ENCOUNTER — Ambulatory Visit (INDEPENDENT_AMBULATORY_CARE_PROVIDER_SITE_OTHER): Payer: Medicare Other | Admitting: Vascular Surgery

## 2015-06-05 ENCOUNTER — Encounter: Payer: Self-pay | Admitting: Vascular Surgery

## 2015-06-05 VITALS — BP 113/59 | HR 62 | Ht 62.0 in | Wt 254.9 lb

## 2015-06-05 DIAGNOSIS — I712 Thoracic aortic aneurysm, without rupture, unspecified: Secondary | ICD-10-CM

## 2015-06-05 NOTE — Progress Notes (Signed)
[facility] [place]  Patient name: [name] MRN: [id number] DOB: 12-30-1940 Sex: female  REASON FOR VISIT:  Follow-up of thoracic and aortic aneurysm  HPI: [name] is a 74 y.o. female  Seen today for follow-up of her saccular thoracic aortic aneurysm and also for prior stent graft repair of infrarenal aneurysm. She walks with a walker due to generalized weakness. Does report some shortness of breath. Denies any new cardiac difficulties. She does have a great deal of swelling in her lower extremities due to fluid overload. Is on Xarelto due to chronic atrial fibrillation  Past Medical History  Diagnosis Date  . COPD (chronic obstructive pulmonary disease) ([place])   . DVT (deep venous thrombosis) ([place]) 2004    BLE  . AAA (abdominal aortic aneurysm) ([place])   . Hypothyroidism   . Hyperlipidemia   . History of uterine fibroid   . Hiatal hernia   . History of fibrocystic disease of breast   . HTN (hypertension)   . Anxiety and depression   . PAD (peripheral artery disease) ([place])   . Ovarian mass     right benign  . Anticoagulant long-term use     Failed on Coumadin. On Xarelto  . Atrial fib/flutter, transient June 2012  . Normal nuclear stress test 2012    May 2012  . Esophageal dysmotility   . History of bronchitis   . Exertional dyspnea   . OSA (obstructive sleep apnea)     "haven't been using my CPAP lately" (01/21/12)  . OA (osteoarthritis)     "knees; left shoulder"  . Chronic lower back pain   . Anxiety   . Small cell carcinoma of lung ([place]) 2004    NON-SMALL CELL CARCINOMA OF THE LUNG, METASTATIC TO THE SUPRACLAVICULAR AND MEDIASTINAL LYMPH NODES; in remission  . Ovarian mass     benign, right    Family History  Problem Relation Age of Onset  . Heart disease Father   . Heart attack Father   . Diabetes type II Mother   . Diabetes Mother   . Hypertension Mother   . Heart disease Brother     before age 68  . Heart disease Sister    See's Dr. Acie Fredrickson    SOCIAL HISTORY: Social History  Substance Use Topics  . Smoking status: Former Smoker -- 1.00 packs/day for 40 years    Types: Cigarettes    Quit date: 06/23/2001  . Smokeless tobacco: Never Used  . Alcohol Use: 0.0 oz/week    0 Standard drinks or equivalent per week     Comment: once a week (glass of wine)    No Known Allergies  Current Outpatient Prescriptions  Medication Sig Dispense Refill  . atorvastatin (LIPITOR) 10 MG tablet Take 1 tablet by mouth daily.    Marland Kitchen CALCIUM-MAGNESIUM-ZINC PO Take by mouth.    . CARTIA XT 120 MG 24 hr capsule TAKE 1 CAPSULE EVERY DAY 90 capsule 0  . Cholecalciferol (VITAMIN D) 1000 UNITS capsule Take 1,000 Units by mouth daily.      . citalopram (CELEXA) 20 MG tablet Take 20 mg by mouth daily.     . furosemide (LASIX) 40 MG tablet Take 1 tablet (40 mg total) by mouth 2 (two) times daily. 180 tablet 1  . levothyroxine (SYNTHROID, LEVOTHROID) 88 MCG tablet Take 88 mcg by mouth daily.    Marland Kitchen losartan (COZAAR) 50 MG tablet Take 1 tablet (50 mg total) by mouth daily. [place]  tablet 1  . metoprolol tartrate (LOPRESSOR) 25 MG tablet Take 0.5 tablets (12.5 mg total) by mouth 2 (two) times daily. 90 tablet 3  . morphine (MSIR) 15 MG tablet Take 15 mg by mouth every 4 (four) hours as needed. pain    . NON FORMULARY daily. 2 liters oxygen    . potassium chloride SA (K-DUR,KLOR-CON) 20 MEQ tablet Take 1 tablet (20 mEq total) by mouth 2 (two) times daily. 180 tablet 3  . PROAIR HFA 108 (90 BASE) MCG/ACT inhaler INHALE 2 PUFFS INTO THE LUNGS EVERY 6 (SIX)_HOURS AS NEEDED FOR WHEEZING OR SHORTNESS OF BREATH 8.5 g 0  . temazepam (RESTORIL) 15 MG capsule TAKE 1 CAPSULE AT BEDTIME AS NEEDED FOR SLEEP 90 capsule 1  . tiotropium (SPIRIVA HANDIHALER) 18 MCG inhalation capsule INHALE THE CONTENTS OF 1 CAPSULE USING HANDIHALER DEVICE ONCE DAILY AS DIRECTED 30 capsule 6  . XARELTO 20 MG TABS tablet TAKE 1 TABLET BY MOUTH DAILY WITH SUPPER 30 tablet 5   No  current facility-administered medications for this visit.    REVIEW OF SYSTEMS:  '[X]'$  denotes positive finding, '[ ]'$  denotes negative finding Cardiac  Comments:  Chest pain or chest pressure:    Shortness of breath upon exertion: x   Short of breath when lying flat:    Irregular heart rhythm: x       Vascular    Pain in calf, thigh, or hip brought on by ambulation:    Pain in feet at night that wakes you up from your sleep:     Blood clot in your veins:    Leg swelling:  x       Pulmonary    Oxygen at home: x   Productive cough:     Wheezing:         Neurologic    Sudden weakness in arms or legs:     Sudden numbness in arms or legs:     Sudden onset of difficulty speaking or slurred speech:    Temporary loss of vision in one eye:     Problems with dizziness:         Gastrointestinal    Blood in stool:     Vomited blood:         Genitourinary    Burning when urinating:     Blood in urine:        Psychiatric    Major depression:         Hematologic    Bleeding problems:    Problems with blood clotting too easily:        Skin    Rashes or ulcers:        Constitutional    Fever or chills:      PHYSICAL EXAM: Filed Vitals:   06/05/15 1355 06/05/15 1358  BP: 130/109 113/59  Pulse: 62   Height: '5\' 2"'$  (1.575 m)   Weight: 254 lb 14.4 oz (115.622 kg)   SpO2: 96%     GENERAL: The patient is a well-nourished female, in no acute distress. The vital signs are documented above. CARDIAC: There is a  Irregular right  VASCULAR:  Palpable radial pulses. I do not feel pulses in her feet most likely related to her severe swelling PULMONARY: There is good air exchange bilaterally without wheezing or rales. ABDOMEN: Soft and non-tender with normal pitched bowel sounds.  obese MUSCULOSKELETAL: There are no major deformities or cyanosis. NEUROLOGIC: No focal weakness or paresthesias are detected. SKIN: There are  no ulcers or rashes noted. PSYCHIATRIC: The patient has a  normal affect.  carotid arteries withou bruits bilaterally  DATA:   CT scanning of her chest and abdomen was reviewed with the patient. She has excellent positioning of her infrarenal aortic stent graft with continued diminished size of her psych. She essentially has had complete closure of the sac around her stent graft.   Her chest reveals a continued slight increase in size of her saccular aneurysm. This is a between 2-1/2 and 3 cm outpouching in the medial aspect of her arch below the takeoff of her left subclavian artery. This has continued to increase in size several millimeters per year over the last several years  MEDICAL ISSUES:  long discussion regarding her thoracic saccular aneurysm. This has shown continued growth over time. She  Appears to have the anatomy in her thoracic aorta very favorable for stent graft repair of this. I am somewhat concerned regarding accessed the iliac arteries to the larger delivery system required for placement of a thoracic stent graft. She reports that she would favor elective repair if this is technically possible recmmend continued watching. We will schedule plan for her to see Dr. Martinique for cardiac clearance and will review her films with the manufacture and make final recommendation. She wished to defer any consideration of repair until after the first of the year.  No Follow-up on file.   Curt Jews [facility] [place]: [phone]

## 2015-06-05 NOTE — Telephone Encounter (Signed)
Received a fax from [facility] [employer], stating she was missing some info. This is for Xarelto. She needs a list of drugs purchased and her out of pocket costs. i advised her of this and she will contact her pharmacist and fax this to [facility].

## 2015-06-11 DIAGNOSIS — F329 Major depressive disorder, single episode, unspecified: Secondary | ICD-10-CM | POA: Diagnosis not present

## 2015-06-11 DIAGNOSIS — Z1389 Encounter for screening for other disorder: Secondary | ICD-10-CM | POA: Diagnosis not present

## 2015-06-11 DIAGNOSIS — Z6841 Body Mass Index (BMI) 40.0 and over, adult: Secondary | ICD-10-CM | POA: Diagnosis not present

## 2015-06-11 DIAGNOSIS — I10 Essential (primary) hypertension: Secondary | ICD-10-CM | POA: Diagnosis not present

## 2015-06-11 DIAGNOSIS — I872 Venous insufficiency (chronic) (peripheral): Secondary | ICD-10-CM | POA: Diagnosis not present

## 2015-06-11 DIAGNOSIS — D508 Other iron deficiency anemias: Secondary | ICD-10-CM | POA: Diagnosis not present

## 2015-06-11 DIAGNOSIS — M1990 Unspecified osteoarthritis, unspecified site: Secondary | ICD-10-CM | POA: Diagnosis not present

## 2015-06-11 DIAGNOSIS — E038 Other specified hypothyroidism: Secondary | ICD-10-CM | POA: Diagnosis not present

## 2015-06-11 DIAGNOSIS — E784 Other hyperlipidemia: Secondary | ICD-10-CM | POA: Diagnosis not present

## 2015-06-11 DIAGNOSIS — I480 Paroxysmal atrial fibrillation: Secondary | ICD-10-CM | POA: Diagnosis not present

## 2015-06-11 DIAGNOSIS — J449 Chronic obstructive pulmonary disease, unspecified: Secondary | ICD-10-CM | POA: Diagnosis not present

## 2015-06-26 ENCOUNTER — Other Ambulatory Visit: Payer: Self-pay | Admitting: Cardiology

## 2015-06-26 ENCOUNTER — Other Ambulatory Visit: Payer: Self-pay | Admitting: *Deleted

## 2015-06-26 MED ORDER — FUROSEMIDE 40 MG PO TABS: 40.0000 mg | ORAL_TABLET | Freq: Two times a day (BID) | ORAL | Status: DC

## 2015-06-26 MED ORDER — DILTIAZEM HCL ER COATED BEADS 120 MG PO CP24: 120.0000 mg | ORAL_CAPSULE | Freq: Every day | ORAL | Status: DC

## 2015-07-09 ENCOUNTER — Telehealth: Payer: Self-pay | Admitting: Internal Medicine

## 2015-07-09 DIAGNOSIS — J9611 Chronic respiratory failure with hypoxia: Secondary | ICD-10-CM

## 2015-07-09 MED ORDER — ALBUTEROL SULFATE HFA 108 (90 BASE) MCG/ACT IN AERS: 2.0000 | INHALATION_SPRAY | Freq: Four times a day (QID) | RESPIRATORY_TRACT | Status: DC | PRN

## 2015-07-09 NOTE — Telephone Encounter (Signed)
Spoke with pt. States that she needs a written order for oxygen cylinder tanks. She will be going on trip to [place] and wants to have this in case she needs to get tanks while in [place]. Also requests a refill of Ventolin be sent in. This has been done.  CY - please advise on written order for oxygen. Thanks.

## 2015-07-09 NOTE — Telephone Encounter (Signed)
Has she talked with [facility]- her DME- about travel plans?   They could help her make arrangements for O2 during travel.  Ok if she wants to carry a printed script for O2 continuous and portable, 2L/ min,              Qualifying O2 sat at rest on room air 10/11/12   86%             Dx chronic respiratory failure with hypoxia

## 2015-07-09 NOTE — Telephone Encounter (Signed)
Attempted to call pt. Received fast busy signal. Will try back.

## 2015-07-10 ENCOUNTER — Telehealth: Payer: Self-pay | Admitting: Internal Medicine

## 2015-07-10 NOTE — Telephone Encounter (Signed)
Spoke with pt, states she will contact [facility] also but would like a hard copy rx for 02 to take with her to [place] "just in case". Verified home address.  rx printed and placed in outgoing mail.  Nothing further needed.

## 2015-07-10 NOTE — Telephone Encounter (Signed)
Spoke with patient-states her daughter found a DME in [place], [place] to get her O2 containers from while in [place], [place] for 1 month. [facility] [place] [address] Telephone: [phone] Fax # [phone]. Pt states [facility] will need her Demographics with Insurance, Rx, and most recent OV notes. Pt is aware that I will check with [facility] to see about patient using them instead of [facility] so the patient is not considered having 2 DME's for her O2 needs and possibly be charged out of pocket for her tanks in [place], [place].   Pt is aware that I will call her back Wednesday 07-11-15.

## 2015-07-10 NOTE — Telephone Encounter (Signed)
[phone] calling back

## 2015-07-11 ENCOUNTER — Telehealth: Payer: Self-pay | Admitting: Internal Medicine

## 2015-07-11 DIAGNOSIS — J439 Emphysema, unspecified: Secondary | ICD-10-CM

## 2015-07-11 NOTE — Telephone Encounter (Signed)
Spoke with [name] with [facility]-she states [facility] does not have an office out in [place] but they do have a "travel" program that should assist with her O2 needs. She will need to contact [facility] office. Pt is aware and will do this today. Nothing more needed at this time.

## 2015-07-11 NOTE — Telephone Encounter (Signed)
Per 07/10/15 phone note: Lorane Gell, CMA at 07/11/2015 4:08 PM     Status: Signed       Expand All Collapse All   Spoke with Melissa with [facility]-she states [facility] does not have an office out in [place] but they do have a "travel" program that should assist with her O2 needs. She will need to contact [facility] office. Pt is aware and will do this today. Nothing more needed at this time.             Lorane Gell, CMA at 07/10/2015 4:55 PM     Status: Signed       Expand All Collapse All   Spoke with patient-states her daughter found a DME in [place], [place] to get her O2 containers from while in [place], [place] for 1 month. [facility] [place] [address] Telephone: [phone] Fax # [phone]. Pt states [facility] will need her Demographics with Insurance, Rx, and most recent OV notes. Pt is aware that I will check with [facility] to see about patient using them instead of [facility] so the patient is not considered having 2 DME's for her O2 needs and possibly be charged out of pocket for her tanks in [place], [place].   Pt is aware that I will call her back Wednesday 07-11-15.       --  Called spoke with pt. She needs her RX for O2, demographics, recent OV notes sent to [facility] [employer] in [place]. Confirmed fax # above. She is leaving Saturday and needs this done ASAP. Pt reports she uses 2 liters O2 24/7. She reports [facility] has been unable to help her. I have placed order to [facility] for order. Nothing further needed

## 2015-08-22 ENCOUNTER — Ambulatory Visit (INDEPENDENT_AMBULATORY_CARE_PROVIDER_SITE_OTHER): Payer: Medicare Other | Admitting: Internal Medicine

## 2015-08-22 ENCOUNTER — Ambulatory Visit (INDEPENDENT_AMBULATORY_CARE_PROVIDER_SITE_OTHER)
Admission: RE | Admit: 2015-08-22 | Discharge: 2015-08-22 | Disposition: A | Discharge status: Home / Self Care | Payer: Medicare Other | Source: Ambulatory Visit | Attending: Internal Medicine | Admitting: Internal Medicine

## 2015-08-22 ENCOUNTER — Encounter: Payer: Self-pay | Admitting: Internal Medicine

## 2015-08-22 VITALS — BP 98/64 | HR 59 | Ht 62.0 in | Wt 250.4 lb

## 2015-08-22 DIAGNOSIS — I5030 Unspecified diastolic (congestive) heart failure: Secondary | ICD-10-CM | POA: Diagnosis not present

## 2015-08-22 DIAGNOSIS — J9611 Chronic respiratory failure with hypoxia: Secondary | ICD-10-CM | POA: Diagnosis not present

## 2015-08-22 DIAGNOSIS — G4733 Obstructive sleep apnea (adult) (pediatric): Secondary | ICD-10-CM

## 2015-08-22 DIAGNOSIS — J9690 Respiratory failure, unspecified, unspecified whether with hypoxia or hypercapnia: Secondary | ICD-10-CM | POA: Diagnosis not present

## 2015-08-22 NOTE — Assessment & Plan Note (Signed)
Dentures, so not an oral appliance candidate. Not a good surgical candidate because of medical morbidities. Not tolerant of CPAP. Weight loss would be ideal but she has not changed her lifestyle to accomplish this. Anticipate she may need BiPAP placed by respiratory therapy at time of surgery.

## 2015-08-22 NOTE — Assessment & Plan Note (Signed)
She desaturates quickly on room air with any exertion and is reminded to use her oxygen continuously.

## 2015-08-22 NOTE — Progress Notes (Signed)
05/19/11- 75 yoF former smoker followed for COPD and OSA with hx Lung CA/xrt/chemo, DVT, AFlutter LOV- 04/16/10 Has had flu vaccine. Reports coughing up a streak of blood about 2 weeks ago. Chest has been tight, not much cough-mostly with meals. She thinks swallowing triggers her cough. More short of breath than usual. Some reflux. No chest pain or palpitation fever, sweat or enlarged glands. Using Combivent at least once most days has made her feel better. Blows nose occasionally with some blood or green. Denies headache. Continues CPAP at 12 CWP/[facility]/O2 2 L per minute for sleep Had CT scan of chest 05/06/2011 to followup of her aortic aneurysm. Showed emphysema, stable aneurysm, enlarged right lobe of thyroid, coronary disease. Images reviewed.  01/20/12- 75 yoF former smoker followed for COPD and OSA with hx Lung CA/xrt/chemo, DVT, AFlutter Increased SOB x 2 months and getting worse; unable to do many activities Dry cough treated w/ cough drops; denies fever, sweat, chest pain.  Feet and legs stay swollen. + Hx DVT, on ASA- eval by Dr Reynaldo Minium, Dr early. Cardiology has told her "heart is fine'. Dr Jana Hakim has told her no recurrence of lung Ca. She stopped CPAP after bad cold last winter ([facility]). Still sleeping with O2 2L/M  01/29/12-  75 yoF former smoker followed for COPD and OSA with hx Lung CA/xrt/chemo, DVT, AFlutter Post hospital 01/21/12-01/23/12 decompensated OHS/ OSA with fluid overload. Started on home oxygen. Trying CPAP again using nasal pillows. temazepam helps. Has a rescue Combivent but also uses Spiriva sore address this. She very rarely needs her rescue inhaler but we are going  to switch to albuterol Leg vein dopplers 01/21/12-- Neg, although we initially got a telephone report of superficial phlebitis which is when we sent her to the hospital for evaluation. CTa- 01/21/12-  IMPRESSION:  No evidence for pulmonary embolism.  Small right pleural effusion.  Diffuse emphysematous  disease and subtle parenchymal densities in  the lungs. Findings could represent atelectasis or mild edema.  Few areas of volume loss but no focal consolidation.  Stable saccular aneurysm involving descending thoracic aorta.  Stable nodules in the lower neck may be related to thyroid nodules.  Original Report Authenticated By: Markus Daft, M.D.   04/12/12- 75 yoF former smoker followed for COPD and OSA with hx Lung CA/xrt/chemo, DVT, AFlutter Had flu vaccine. Not using CPAP at all. Continues oxygen 2 L/[facility]. Rarely needs albuterol rescue inhaler. Asks about portable oxygen concentrator. No longer on Coumadin for history of DVT. Just using aspirin. COPD assessment test (CAT) score 22/40  10/11/12- 75 yoF former smoker followed for COPD and OSA with hx Lung CA/xrt/chemo, DVT, AFlutter FOLLOWS FOR: Increased SOB with allergies flaring up She says she "got tired of bothering with oxygen" and left it in the car. By the time she got to her exam room today saturation was 83% on room air. infrequent use of rescue inhaler. [facility] O2 2L Not using CPAP. CT chest 05/11/12 ( for Dr Donnetta Hutching) IMPRESSION:  1. Slight increase in size of a 4.5cm saccular aneurysm from the  mid descending thoracic aorta.  2. Bilateral thyroid lesions. Consider further evaluation with  thyroid ultrasound. If patient is clinically hyperthyroid,  consider nuclear medicine thyroid uptake and scan.  05/16/13- 75 yoF former smoker followed for COPD and OSA/ failed CPAP,  with hx Lung CA/xrt/chemo, DVT, AFlutter Dyspnea on exertion. Continues to wear oxygen 2 L/[facility]. Mild hacking cough over the past 6 months. No phlegm. Denies chest pain, fever, sweat, adenopathy. Chest  CT pending tomorrow with Dr. Donnetta Hutching. Spiriva helpful. Using pro-air once or twice daily now.  11/28/13   FOLLOWS FOR: Pt states she is SOB with any abmbulation and with little daily activty. C/o minimal dry cough. Denies CP/tightness.  Continues oxygen 2  L/[facility]. Quit CPAP long ago. No bleeding on Xarelto  05/29/14- 75 yoF former smoker followed for COPD and OSA/ failed CPAP,  with hx Lung CA/xrt/chemo, DVT, AFlutter FOLLOWS FOR: has "good and bad days", c/o sob with exertion.     O2 2L/ [facility] Anoro not helpful. Uses albuterol. Spiriva OK. CT chest 05/23/14 IMPRESSION: Saccular aneurysm of the mid descending thoracic aorta has slightly increased from a maximal diameter of 4.2 cm to 4.5 cm. Stable aorto bi-iliac stent graft without evidence of endoleak. Maximal diameter is 2.9 cm. Electronically Signed  By: Maryclare Bean M.D.  On: 05/23/2014 11:25  11/28/14- 75 yoF former smoker followed for COPD and OSA/ failed CPAP,  with hx Lung CA/xrt/chemo, DVT, AFlutter Reports: Follows For: Pt c/o increased SOB and fatigue, slight wheezing, dry cough. Sinus pressure and drainage.  Denies any chest congestion/tightness.  O2 2 L at night and as needed/ [facility] Using Spiriva and Proventil Office spirometry 11/28/2014-moderate restriction of exhaled volume and moderate obstruction. FVC 1.75/69%, FEV1 1.26/66%, FEV1/FVC 0.72, FEF 25-75 percent 0.77/47%.  04/12/15- 04/12/2015 Acute OV  Pt presents for an acute office visit.  She complains of increased SOB, right sided chest tightness, cough, wheezing PND x 3 weeks.  Gets winded with minimal activity.  On lasix '40mg'$  Twice daily , sometimes does not take second dose. Legs more swollen in evenings.  Denies any chest congesiton, cough, fever, nausea or vomitting, hemoptysis .  No otc used.  Last cxr showed scarring in June this year.   04/24/15- 70 yoF former smoker followed for COPD and OSA/ failed CPAP,  complicated by hx Lung CA/xrt/chemo, DVT, AFlutter FOLLOWS FOR: seen by TP on 04-12-15; pt states she feels better-SOB still there but chest tightness has gone away. O2 2 L at night and as needed/ [facility] Still aware of shortness of breath she continues oxygen at 2 L. Admits she sleeps Lasix  off anytime she is going out. Legs remain swollen. Less chest congestion then at last visit here in October. Pending CT chest following atherosclerosis in her aorta. Chest x-ray images reviewed together CXR 04/12/15  IMPRESSION: Severe diffuse interstitial change, which represents a change from 11/29/2014. Differential diagnostic possibilities include atypical pneumonia. Pulmonary edema appears to be on likely in the absence of vascular congestion or pleural effusion. Lymphangitic spread of tumor is another possibility that is considered less likely. Electronically Signed  By: Skipper Cliche M.D.  On: 04/12/2015 17:39  08/22/2015-75 year old female former smoker followed for COPD, chronic respiratory failure with hypoxia, OSA/failed CPAP, history lung CA/XRT/chemotherapy, DVT, a flutter O2 2 L/[facility] FOLLOW FOR: not feeling well.  Wheezing a lot, O2 was 79% on RA when patient arrived, pt states she had been off oxygen for 10 minutes before she was brought back to room, placed on 2L Continuous, increased to 91% on 2L.  Pt states she has no energy.   Came in on room air, desaturating to 79% but wore oxygen driving over here. Not acutely ill. Chronic dyspnea on exertion. We reviewed chest x-ray from October 20 showing diffuse interstitial process, for repeat today. Not much cough, no fever or sweat. Chronic ankle edema. Difficult for her to pull elastic hose on. Working with Dr. Virgia Land surgery considering repair of thoracic  aortic aneurysm. Cardiology follow-up pending  ROS-see HPI Constitutional:   No-   weight loss, night sweats, fevers, chills, fatigue, lassitude. HEENT:   No-  headaches, difficulty swallowing, tooth/dental problems, sore throat,       No-  sneezing, itching, ear ache, nasal congestion, post nasal drip,  CV:  No-   chest pain, orthopnea, PND, +less swelling in lower extremities, No-anasarca, dizziness,                     +Occasional palpitations Resp:     +shortness of breath with exertion or at rest.              No- productive cough,  +non-productive cough,  no-coughing up of blood.              No-   change in color of mucus.  No- wheezing.   Skin: No-   rash or lesions. GI:  No-   heartburn, indigestion, abdominal pain, nausea, vomiting,  GU: N MS:  No-   joint pain or swelling.   Neuro-     nothing unusual Psych:  No- change in mood or affect. No depression or anxiety.  No memory loss.  OBJ- Physical Exam   General- Alert, Oriented, Affect-appropriate, Distress- none, + obese. O2 2L/ [facility] Skin- rash-none, lesions- none, excoriation- none Lymphadenopathy- none Head- atraumatic            Eyes- Gross vision intact, PERRLA, conjunctivae and secretions clear            Ears- Hearing, canals-normal            Nose- Clear, no-Septal dev, mucus, polyps, erosion, perforation             Throat- Mallampati II , mucosa clear , drainage- none, tonsils- atrophic, + dentures Neck- flexible , trachea midline, no stridor , thyroid nl, carotid no bruit Chest - symmetrical excursion , unlabored           Heart/CV- RR occasional extra beat, no murmur , no gallop  , no rub, nl s1 s2                           - JVD- none ,  edema +2-3 chronic, + stasis changes, varices- none           Lung- clear, wheeze- none, cough- none , dullness-none, rub- none           Chest wall-  Abd-  Br/ Gen/ Rectal- Not done, not indicated Extrem- cyanosis- none, clubbing, none, atrophy- none, strength- nl,+ Stasis changes with heavy legs Neuro- grossly intact to observation

## 2015-08-22 NOTE — Assessment & Plan Note (Addendum)
BNP initially elevated, chronic peripheral edema. Rhythm seems to be sinus at this visit, without EKG. She is pending follow-up with cardiology.  Plan-CXR

## 2015-08-22 NOTE — Patient Instructions (Addendum)
Order- CXR   Dx chronic respiratory failure with hypoxia  Ok to run your oxygen at 2-3 L/M as needed

## 2015-08-27 ENCOUNTER — Other Ambulatory Visit: Payer: Self-pay | Admitting: Internal Medicine

## 2015-08-27 DIAGNOSIS — J849 Interstitial pulmonary disease, unspecified: Secondary | ICD-10-CM

## 2015-08-29 ENCOUNTER — Other Ambulatory Visit: Payer: Medicare Other

## 2015-08-31 ENCOUNTER — Encounter: Payer: Self-pay | Admitting: Vascular Surgery

## 2015-09-04 ENCOUNTER — Encounter: Payer: Self-pay | Admitting: Vascular Surgery

## 2015-09-04 ENCOUNTER — Ambulatory Visit (INDEPENDENT_AMBULATORY_CARE_PROVIDER_SITE_OTHER): Payer: Medicare Other | Admitting: Vascular Surgery

## 2015-09-04 ENCOUNTER — Ambulatory Visit
Admission: RE | Admit: 2015-09-04 | Discharge: 2015-09-04 | Disposition: A | Discharge status: Home / Self Care | Payer: Medicare Other | Source: Ambulatory Visit | Attending: Internal Medicine | Admitting: Internal Medicine

## 2015-09-04 VITALS — BP 108/62 | HR 54 | Temp 97.4°F | Resp 20 | Ht 62.0 in | Wt 251.0 lb

## 2015-09-04 DIAGNOSIS — I712 Thoracic aortic aneurysm, without rupture, unspecified: Secondary | ICD-10-CM

## 2015-09-04 DIAGNOSIS — J849 Interstitial pulmonary disease, unspecified: Secondary | ICD-10-CM

## 2015-09-04 NOTE — Addendum Note (Signed)
Addended by: Mena Goes on: 09/04/2015 01:02 PM   Modules accepted: Orders

## 2015-09-04 NOTE — Progress Notes (Signed)
[facility] [place]  Patient name: [name] MRN: [id number] DOB: [birth year] Sex: female  REASON FOR VISIT: Discuss thoracic aneurysm  HPI: [name] is a 75 y.o. female here today for continued discussion of known thoracic aneurysm. Her last visit was in December 2006 at which time she had a CT of her chest and abdomen. She had a prior placed aortic bifurcated stent graft which was in excellent position and no difficulty. She has a saccular aneurysm in her descending thoracic aorta. This is shown some very slight change over the last several years. She has a significant pulmonary disease. She has room air saturations recently at 79%. She is on CPAP at home. Walks with a walker and does have significant shortness of breath with any exertion. She recently saw Dr. Annamaria Boots from pulmonary standpoint and he ordered a CT scan which I have for review today. This shows no change in the short interval between December and currently regarding her saccular aneurysm. This extends out approximately 2.6 cm from the contour of where her aorta normally would be. He has no symptoms referable to this.  Past Medical History  Diagnosis Date  . COPD (chronic obstructive pulmonary disease) ([place])   . DVT (deep venous thrombosis) ([place]) 2004    BLE  . AAA (abdominal aortic aneurysm) ([place])   . Hypothyroidism   . Hyperlipidemia   . History of uterine fibroid   . Hiatal hernia   . History of fibrocystic disease of breast   . HTN (hypertension)   . Anxiety and depression   . PAD (peripheral artery disease) ([place])   . Ovarian mass     right benign  . Anticoagulant long-term use     Failed on Coumadin. On Xarelto  . Atrial fib/flutter, transient June 2012  . Normal nuclear stress test 2012    May 2012  . Esophageal dysmotility   . History of bronchitis   . Exertional dyspnea   . OSA (obstructive sleep apnea)     "haven't been using my CPAP lately" (01/21/12)  . OA (osteoarthritis)    "knees; left shoulder"  . Chronic lower back pain   . Anxiety   . Small cell carcinoma of lung ([place]) 2004    NON-SMALL CELL CARCINOMA OF THE LUNG, METASTATIC TO THE SUPRACLAVICULAR AND MEDIASTINAL LYMPH NODES; in remission  . Ovarian mass     benign, right    Family History  Problem Relation Age of Onset  . Heart disease Father   . Heart attack Father   . Diabetes type II Mother   . Diabetes Mother   . Hypertension Mother   . Heart disease Brother     before age 3  . Heart disease Sister     See's Dr. Acie Fredrickson    SOCIAL HISTORY: Social History  Substance Use Topics  . Smoking status: Former Smoker -- 1.00 packs/day for 40 years    Types: Cigarettes    Quit date: 06/23/2001  . Smokeless tobacco: Never Used  . Alcohol Use: 0.0 oz/week    0 Standard drinks or equivalent per week     Comment: once a week (glass of wine)    No Known Allergies  Current Outpatient Prescriptions  Medication Sig Dispense Refill  . albuterol (PROVENTIL HFA;VENTOLIN HFA) 108 (90 Base) MCG/ACT inhaler Inhale 2 puffs into the lungs every 6 (six) hours as needed for wheezing or shortness of breath. 1 Inhaler 2  . atorvastatin (LIPITOR) 10 MG tablet  Take 1 tablet by mouth daily.    Marland Kitchen CALCIUM-MAGNESIUM-ZINC PO Take by mouth.    . Cholecalciferol (VITAMIN D) 1000 UNITS capsule Take 1,000 Units by mouth daily.      . citalopram (CELEXA) 20 MG tablet Take 20 mg by mouth daily.     Marland Kitchen diltiazem (CARTIA XT) 120 MG 24 hr capsule Take 1 capsule (120 mg total) by mouth daily. 90 capsule 2  . furosemide (LASIX) 40 MG tablet Take 1 tablet (40 mg total) by mouth 2 (two) times daily. 180 tablet 2  . levothyroxine (SYNTHROID, LEVOTHROID) 88 MCG tablet Take 88 mcg by mouth daily.    Marland Kitchen losartan (COZAAR) 50 MG tablet Take 1 tablet (50 mg total) by mouth daily. 30 tablet 1  . metoprolol tartrate (LOPRESSOR) 25 MG tablet Take 0.5 tablets (12.5 mg total) by mouth 2 (two) times daily. 90 tablet 3  . morphine (MSIR) 15 MG  tablet Take 15 mg by mouth every 4 (four) hours as needed. pain    . NON FORMULARY daily. 2 liters oxygen    . potassium chloride SA (K-DUR,KLOR-CON) 20 MEQ tablet Take 1 tablet (20 mEq total) by mouth 2 (two) times daily. 180 tablet 3  . temazepam (RESTORIL) 15 MG capsule TAKE 1 CAPSULE AT BEDTIME AS NEEDED FOR SLEEP 90 capsule 1  . tiotropium (SPIRIVA HANDIHALER) 18 MCG inhalation capsule INHALE THE CONTENTS OF 1 CAPSULE USING HANDIHALER DEVICE ONCE DAILY AS DIRECTED 30 capsule 6  . XARELTO 20 MG TABS tablet TAKE 1 TABLET BY MOUTH DAILY WITH SUPPER 30 tablet 5   No current facility-administered medications for this visit.    REVIEW OF SYSTEMS:  '[X]'$  denotes positive finding, '[ ]'$  denotes negative finding Cardiac  Comments:  Chest pain or chest pressure:    Shortness of breath upon exertion: x   Short of breath when lying flat:    Irregular heart rhythm: x       Vascular    Pain in calf, thigh, or hip brought on by ambulation:    Pain in feet at night that wakes you up from your sleep:     Blood clot in your veins:    Leg swelling:  x       Pulmonary    Oxygen at home: x   Productive cough:     Wheezing:         Neurologic    Sudden weakness in arms or legs:     Sudden numbness in arms or legs:     Sudden onset of difficulty speaking or slurred speech:    Temporary loss of vision in one eye:     Problems with dizziness:         Gastrointestinal    Blood in stool:     Vomited blood:         Genitourinary    Burning when urinating:     Blood in urine:        Psychiatric    Major depression:         Hematologic    Bleeding problems:    Problems with blood clotting too easily:        Skin    Rashes or ulcers:        Constitutional    Fever or chills:      PHYSICAL EXAM: Filed Vitals:   09/04/15 1150  BP: 108/62  Pulse: 54  Temp: 97.4 F (36.3 C)  TempSrc: Oral  Resp: 20  Height:  $'5\' 2"'C$  (1.575 m)  Weight: 251 lb (113.853 kg)  SpO2: 90%    GENERAL: The  patient is a well-nourished female, in no acute distress. The vital signs are documented above. PULMONARY: She does have some dyspnea in the exam room  NEUROLOGIC: No focal weakness or paresthesias are detected. SKIN: There are no ulcers or rashes noted. PSYCHIATRIC: The patient has a normal affect.  DATA:  Discussed the CT scan from today as mentioned above. No change in her saccular aneurysm  MEDICAL ISSUES: Had long discussion with patient. I'm concerned regarding access to her thoracic aorta daily via her iliac arteries which are small on her CT scan from December. Feel that she is significant risk for injury to her iliac arteries. Feel that her risk for elective repair outweighs her risk for rupture currently. I would recommend continued observation with a CT scan in 6 months. She is understandably concerned regarding the saccular aneurysm but agrees that waiting is the most appropriate plan. We will see her again in 6 months No Follow-up on file.   Curt Jews [facility] [place]: [phone]

## 2015-09-05 NOTE — Progress Notes (Signed)
Quick Note:  LVM for pt to return call ______ 

## 2015-10-05 ENCOUNTER — Telehealth: Payer: Self-pay | Admitting: *Deleted

## 2015-10-05 NOTE — Telephone Encounter (Signed)
S/w pt is aware of new appointment date and time due to conflict on provider schedule

## 2015-10-08 ENCOUNTER — Ambulatory Visit: Payer: Medicare Other | Admitting: Nurse Practitioner

## 2015-10-19 ENCOUNTER — Ambulatory Visit: Payer: Medicare Other | Admitting: Nurse Practitioner

## 2015-10-30 ENCOUNTER — Ambulatory Visit (INDEPENDENT_AMBULATORY_CARE_PROVIDER_SITE_OTHER): Payer: Medicare Other | Admitting: Nurse Practitioner

## 2015-10-30 ENCOUNTER — Telehealth: Payer: Self-pay | Admitting: Internal Medicine

## 2015-10-30 ENCOUNTER — Encounter: Payer: Self-pay | Admitting: Nurse Practitioner

## 2015-10-30 VITALS — BP 110/60 | HR 76 | Ht 62.0 in | Wt 250.8 lb

## 2015-10-30 DIAGNOSIS — I712 Thoracic aortic aneurysm, without rupture, unspecified: Secondary | ICD-10-CM

## 2015-10-30 DIAGNOSIS — I480 Paroxysmal atrial fibrillation: Secondary | ICD-10-CM

## 2015-10-30 DIAGNOSIS — I5032 Chronic diastolic (congestive) heart failure: Secondary | ICD-10-CM

## 2015-10-30 DIAGNOSIS — R0600 Dyspnea, unspecified: Secondary | ICD-10-CM | POA: Diagnosis not present

## 2015-10-30 NOTE — Telephone Encounter (Signed)
Spoke with Nettie Elm at [facility]. Pt wants a second opinion from another one of our providers. Per Hilda Blades, pt DOES NOT want to switch providers, just wants a second opinion. Stefan Church that we do not allow this at our office. Hilda Blades agreed and will let the pt know. Nothing further was needed.

## 2015-10-30 NOTE — Patient Instructions (Addendum)
We will be checking the following labs today - NONE   Medication Instructions:    Continue with your current medicines.     Testing/Procedures To Be Arranged:  N/A  Follow-Up:   See Dr. Martinique in 4 months  Can we see if Dr. Waymon Budge, Dr. Lamonte Sakai or Dr. Pennie Banter could see her for a second opinion for DOE    Other Special Instructions:   N/A    If you need a refill on your cardiac medications before your next appointment, please call your pharmacy.   Call the [place] office at [phone] if you have any questions, problems or concerns.

## 2015-10-30 NOTE — Progress Notes (Signed)
CARDIOLOGY OFFICE NOTE  Date:  10/30/2015    [name] Date of Birth: 14-Apr-1941 Medical Record #[id number]  PCP:  Geoffery Lyons, MD  Cardiologist:  Martinique    Chief Complaint  Patient presents with  . Hypertension  . Shortness of Breath    Follow up visit - seen for Dr. Martinique    History of Present Illness: [name] is a 75 y.o. female who presents today for a follow up visit. Seen for Dr. Martinique. She has chronic dyspnea. Has had past normal nuclear and echo. Does have moderate pulmonary HTN related to chronic COPD. Former smoker. Has had lung cancer treated with XRT and chemo. Other issues include OSA, past DVT, paroxysmal atrial flutter. She has had prior aorto bi-iliac stent grafting per Dr. Donnetta Hutching. Has a known thoracic aneurysm - last measured in March of 2017 and is 4.5 x 3.6 x 2.8 - continuing to be followed by Dr. Donnetta Hutching. Had her DVT while on coumadin. Currently on Xarelto. Has had issues with beta blocker and bradycardia in the past - and only able to tolerate low dose.   Last seen back by me back in October of 2016 -was felt to be stable but with progressive dyspnea. Mostly limited by back pain. Chronically short of breath. Labs are checked by Dr. Reynaldo Minium. On oxygen at night.   Comes back today. Here alone.Has had some progression of her aneurysm - she and Dr. Donnetta Hutching have elected to continue to just follow. Felt to have significant risk to the iliacs. Apparently is going to have repeat scan in September and may proceed on with her surgery. Says she is good but "can't breath". Sat was low (78%) with walking in here - not using her oxygen all the time. She is frustrated with pulmonary. She is contemplating seeing someone else. No chest pain. Legs remain full. She notes that she is busy with "transporting" grandkids.   Past Medical History  Diagnosis Date  . COPD (chronic obstructive pulmonary disease) ([place])   . DVT (deep venous thrombosis) ([place]) 2004    BLE  . AAA  (abdominal aortic aneurysm) ([place])   . Hypothyroidism   . Hyperlipidemia   . History of uterine fibroid   . Hiatal hernia   . History of fibrocystic disease of breast   . HTN (hypertension)   . Anxiety and depression   . PAD (peripheral artery disease) ([place])   . Ovarian mass     right benign  . Anticoagulant long-term use     Failed on Coumadin. On Xarelto  . Atrial fib/flutter, transient June 2012  . Normal nuclear stress test 2012    May 2012  . Esophageal dysmotility   . History of bronchitis   . Exertional dyspnea   . OSA (obstructive sleep apnea)     "haven't been using my CPAP lately" (01/21/12)  . OA (osteoarthritis)     "knees; left shoulder"  . Chronic lower back pain   . Anxiety   . Small cell carcinoma of lung ([place]) 2004    NON-SMALL CELL CARCINOMA OF THE LUNG, METASTATIC TO THE SUPRACLAVICULAR AND MEDIASTINAL LYMPH NODES; in remission  . Ovarian mass     benign, right    Past Surgical History  Procedure Laterality Date  . Abdominal aortic aneurysm repair  ~ 2010    stent graft  . Replacement total knee Left ~ 2008    left  . Thyroidectomy  ~ 1964  . Bladder surgery  ~  2003    sling  . Cardiovascular stress test  2012    No ischemia  . Breast biopsy  1960's    both breast's - benign  . Cataract extraction w/ intraocular lens  implant, bilateral Bilateral ~ 2009     Medications: Current Outpatient Prescriptions  Medication Sig Dispense Refill  . albuterol (PROVENTIL HFA;VENTOLIN HFA) 108 (90 Base) MCG/ACT inhaler Inhale 2 puffs into the lungs every 6 (six) hours as needed for wheezing or shortness of breath. 1 Inhaler 2  . atorvastatin (LIPITOR) 10 MG tablet Take 1 tablet by mouth daily.    Marland Kitchen CALCIUM-MAGNESIUM-ZINC PO Take by mouth.    . Cholecalciferol (VITAMIN D) 1000 UNITS capsule Take 1,000 Units by mouth daily.      . citalopram (CELEXA) 20 MG tablet Take 20 mg by mouth daily.     Marland Kitchen diltiazem (CARTIA XT) 120 MG 24 hr capsule Take 1 capsule (120 mg  total) by mouth daily. 90 capsule 2  . furosemide (LASIX) 40 MG tablet Take 1 tablet (40 mg total) by mouth 2 (two) times daily. 180 tablet 2  . levothyroxine (SYNTHROID, LEVOTHROID) 88 MCG tablet Take 88 mcg by mouth daily.    Marland Kitchen losartan (COZAAR) 50 MG tablet Take 1 tablet (50 mg total) by mouth daily. 30 tablet 1  . metoprolol tartrate (LOPRESSOR) 25 MG tablet Take 0.5 tablets (12.5 mg total) by mouth 2 (two) times daily. 90 tablet 3  . morphine (MSIR) 15 MG tablet Take 15 mg by mouth every 4 (four) hours as needed. pain    . NON FORMULARY daily. 2 liters oxygen    . potassium chloride SA (K-DUR,KLOR-CON) 20 MEQ tablet Take 1 tablet (20 mEq total) by mouth 2 (two) times daily. 180 tablet 3  . temazepam (RESTORIL) 15 MG capsule TAKE 1 CAPSULE AT BEDTIME AS NEEDED FOR SLEEP 90 capsule 1  . tiotropium (SPIRIVA HANDIHALER) 18 MCG inhalation capsule INHALE THE CONTENTS OF 1 CAPSULE USING HANDIHALER DEVICE ONCE DAILY AS DIRECTED 30 capsule 6  . XARELTO 20 MG TABS tablet TAKE 1 TABLET BY MOUTH DAILY WITH SUPPER 30 tablet 5   No current facility-administered medications for this visit.    Allergies: No Known Allergies  Social History: The patient  reports that she quit smoking about 14 years ago. Her smoking use included Cigarettes. She has a 40 pack-year smoking history. She has never used smokeless tobacco. She reports that she drinks alcohol. She reports that she does not use illicit drugs.   Family History: The patient's family history includes Diabetes in her mother; Diabetes type II in her mother; Heart attack in her father; Heart disease in her brother, father, and sister; Hypertension in her mother.   Review of Systems: Please see the history of present illness.   Otherwise, the review of systems is positive for .   All other systems are reviewed and negative.   Physical Exam: VS:  BP 110/60 mmHg  Pulse 76  Ht '5\' 2"'$  (1.575 m)  Wt 250 lb 12.8 oz (113.762 kg)  BMI 45.86 kg/m2  SpO2  78% .  BMI Body mass index is 45.86 kg/(m^2).  Wt Readings from Last 3 Encounters:  10/30/15 250 lb 12.8 oz (113.762 kg)  09/04/15 251 lb (113.853 kg)  08/22/15 250 lb 6.4 oz (113.581 kg)    General: Pleasant. Morbidly obese female. She is alert and in no acute distress. She is  HEENT: Normal. Neck: Supple, no JVD, carotid bruits, or masses noted.  Cardiac: Regular rate and rhythm. Heart tones are very distant. Legs are full but no significant edema.  Respiratory:  Lungs are fairly clear to auscultation bilaterally with increased work of breathing. Her sat came up to 92% with sitting and deep breathing.  GI: Soft and nontender.  MS: No deformity or atrophy. Gait and ROM intact.Using a walker.  Skin: Warm and dry. Color is normal.  Neuro:  Strength and sensation are intact and no gross focal deficits noted.  Psych: Alert, appropriate and with normal affect.   LABORATORY DATA:  EKG:  EKG is not ordered today.  Lab Results  Component Value Date   WBC 6.3 07/27/2014   HGB 14.6 07/27/2014   HCT 43.6 07/27/2014   PLT 192 07/27/2014   GLUCOSE 114* 04/12/2015   ALT 15 07/27/2014   AST 19 07/27/2014   NA 141 04/12/2015   K 4.2 04/12/2015   CL 103 04/12/2015   CREATININE 0.76 05/23/2015   BUN 11 05/23/2015   CO2 33* 04/12/2015   TSH 0.58 09/27/2013   INR 1.00 01/22/2012    BNP (last 3 results) No results for input(s): BNP in the last 8760 hours.  ProBNP (last 3 results)  Recent Labs  04/12/15 1720  PROBNP 311.0*     Other Studies Reviewed Today:  CXR IMPRESSION: Severe diffuse interstitial change, which represents a change from 11/29/2014. Differential diagnostic possibilities include atypical pneumonia. Pulmonary edema appears to be on likely in the absence of vascular congestion or pleural effusion. Lymphangitic spread of tumor is another possibility that is considered less likely.   Electronically Signed  By: Skipper Cliche M.D.  On: 04/12/2015  17:39  Echo Study Conclusions from 03/2015  - Left ventricle: The cavity size was normal. Wall thickness was  increased in a pattern of mild LVH. Systolic function was normal.  The estimated ejection fraction was in the range of 55% to 60%.  Wall motion was normal; there were no regional wall motion  abnormalities. Doppler parameters are consistent with  pseudonormal left ventricular relaxation (grade 2 diastolic  dysfunction). The E/e&' ratio is >15, suggesting elevated LV  filling pressure. - Mitral valve: Calcified annulus. - Left atrium: The atrium was normal in size. - Tricuspid valve: There was mild regurgitation. - Pulmonary arteries: PA peak pressure: 32 mm Hg (S). - Inferior vena cava: The vessel was dilated. The respirophasic  diameter changes were blunted (< 50%), consistent with elevated  central venous pressure.  Impressions:  - Compared to the prior study in 01/2012, there are few changes. The  RVSP is lower at 32 mmHg, down from 55 mmHg.   CT IMPRESSION: 1. There are findings in the lungs which could indicate interstitial lung disease, such is mild nonspecific interstitial pneumonia (NSIP). 2. Mild diffuse bronchial wall thickening with moderate centrilobular and mild paraseptal emphysema, as well as mild air trapping ; imaging findings suggestive of underlying COPD. 3. Atherosclerosis, including left main and 3 vessel coronary artery disease. Assessment for potential risk factor modification, dietary therapy or pharmacologic therapy may be warranted, if clinically indicated. 4. Mild cardiomegaly.   Electronically Signed  By: Vinnie Langton M.D.  On: 09/04/2015 12:11   Assessment/Plan: 1. Chronic dyspnea - multifactorial given her diastolic HF, COPD, prior lung cancer and pulmonary HTN - on oxygen at night. Would favor using oxygen all the time. Her echo was updated last fall and EF is ok. She is not going to be able to lose weight. She  may not really have other  options but she would at least like another opinion. We Will see if we can facilitate this.   2. PAF - she is in sinus by exam. Remains on her Xarelto - try to help her with samples.   3. Obesity - quite limited in her ability to exercise due to chronic back pain, dyspnea, etc. I do not see this changing.   4. Thoracic aneurysm - followed by Dr. Donnetta Hutching. For possible intervention in 6 months. She will need clearance from cardiology and pulmonary most likely.  Current medicines are reviewed with the patient today.  The patient does not have concerns regarding medicines other than what has been noted above.  The following changes have been made:  See above.  Labs/ tests ordered today include:   No orders of the defined types were placed in this encounter.     Disposition:   FU with Dr. Martinique in 4 months   Patient is agreeable to this plan and will call if any problems develop in the interim.   Signed: Burtis Junes, RN, ANP-C 10/30/2015 11:59 AM  [facility] [address] [place], [place]  [address] Phone: [phone] Fax: [phone]

## 2015-11-12 ENCOUNTER — Telehealth: Payer: Self-pay | Admitting: Internal Medicine

## 2015-11-12 NOTE — Telephone Encounter (Signed)
Fax received and given to CY to sign.

## 2015-11-12 NOTE — Telephone Encounter (Signed)
Spoke with Amy @ pharmacy and advised that if she can fax forms we will give to CY to sign and then fax back. Confirmed fax number. She will fax over this afternoon. Holding in triage until fax received.

## 2015-11-14 NOTE — Telephone Encounter (Signed)
KW has this been returned to you?  Thanks!

## 2015-11-16 NOTE — Telephone Encounter (Signed)
Called and left a message for Amy. Forms need to be refaxed.

## 2015-11-16 NOTE — Telephone Encounter (Signed)
I have not seen any forms on patient; will forward to CY to advise. Thanks.

## 2015-11-16 NOTE — Telephone Encounter (Signed)
I don't recall any such assistance forms

## 2015-11-20 NOTE — Telephone Encounter (Signed)
Called and spoke with Amy at [facility] requesting that she fax the form to [place] again so we can have this completed. Awaiting fax. Sent to [facility] for follow up

## 2015-11-22 NOTE — Telephone Encounter (Signed)
I do not see this form in CY look-at or in folder up front.   KW please advise if this needs to be re-requested, or if this has been handled.  Thanks!

## 2015-11-22 NOTE — Telephone Encounter (Signed)
I have not received any papers on patient. Thanks.

## 2015-11-22 NOTE — Telephone Encounter (Signed)
[employer]. Amy is on vacation until Monday. We will need to follow up with Amy on this matter.

## 2015-11-27 NOTE — Telephone Encounter (Signed)
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 States that she is going to look to see if she can find something and will call back. Our fax# was given to fax forms once found.  Will send to Katie to keep an eye out

## 2015-12-04 ENCOUNTER — Ambulatory Visit: Payer: Medicare Other | Admitting: Internal Medicine

## 2015-12-04 IMAGING — DX DG CHEST 2V
2 series · 2 of 2 positions shown · non-contrast
Comparison: 04/12/2015.

CLINICAL DATA: Emphysema.

EXAM:
CHEST  2 VIEW

[chest pa]
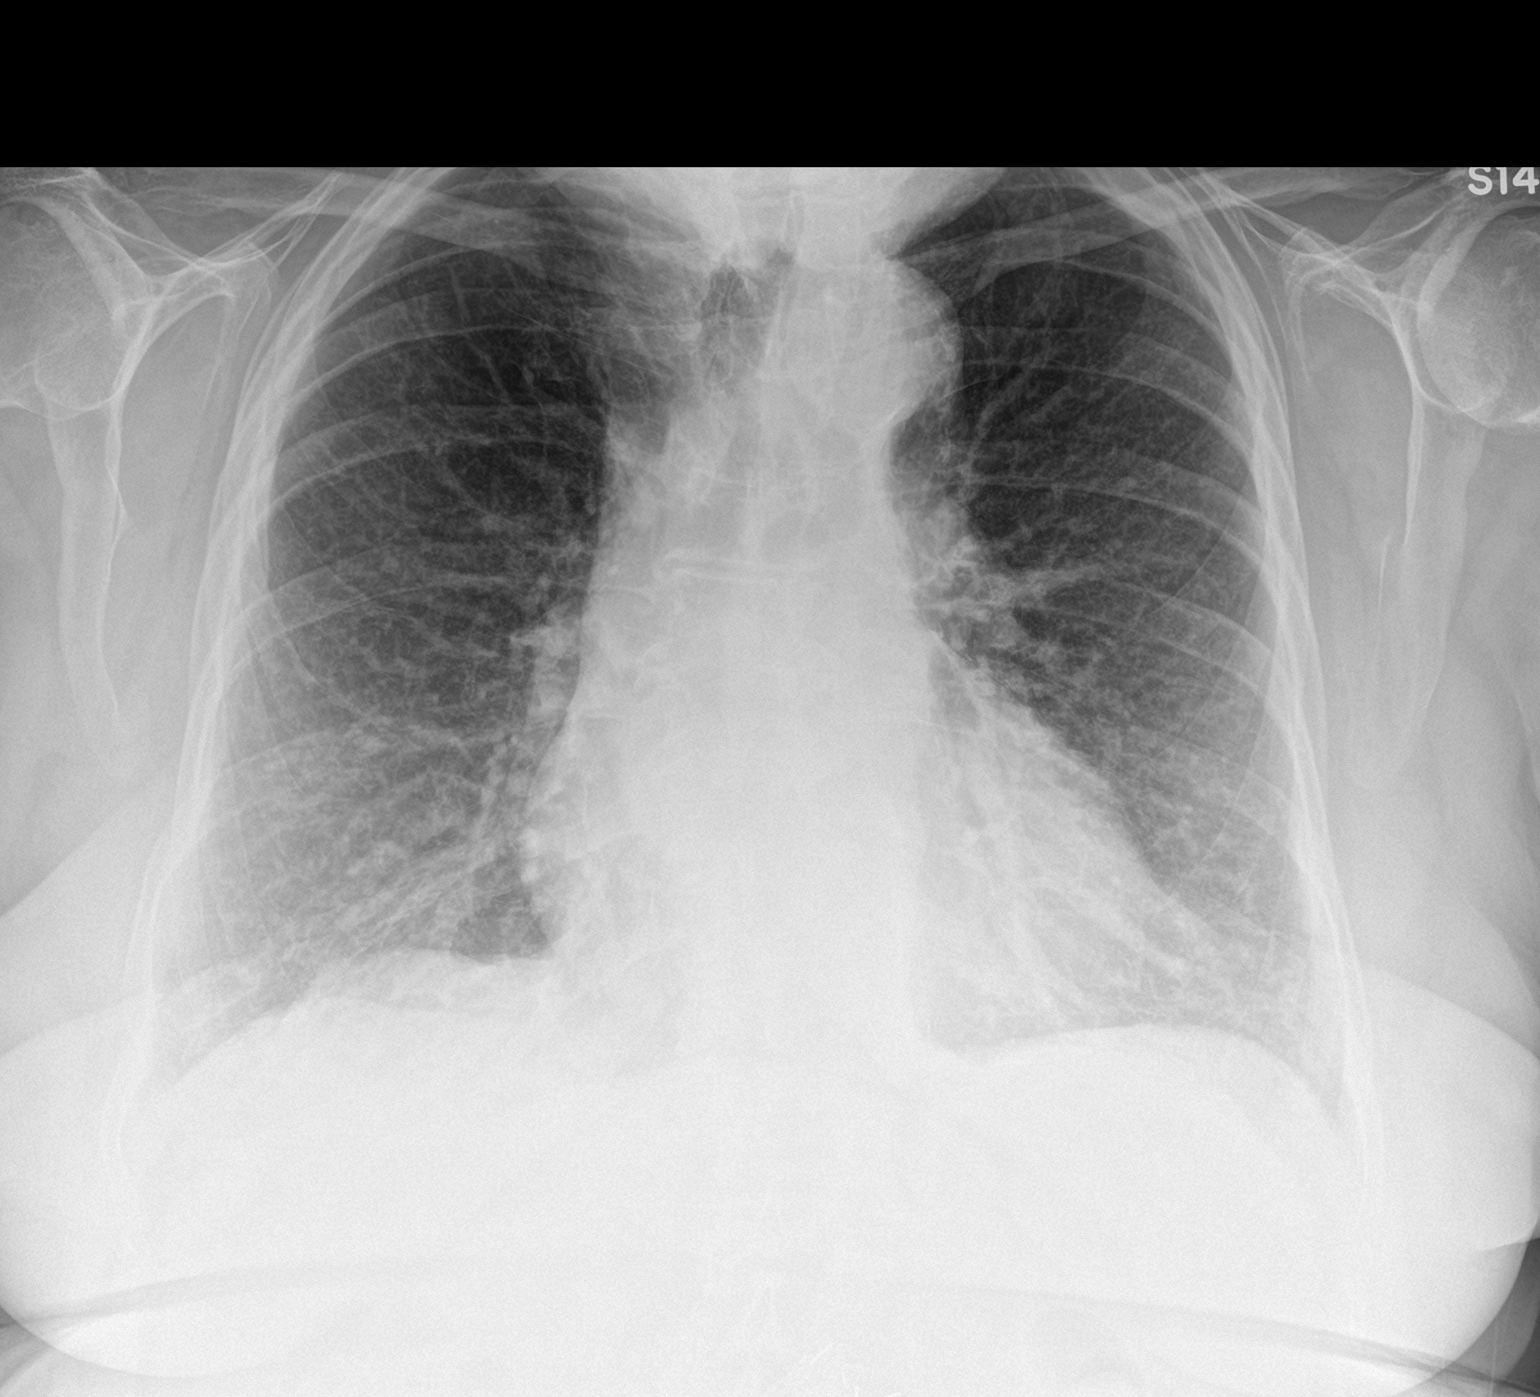

[chest lat]
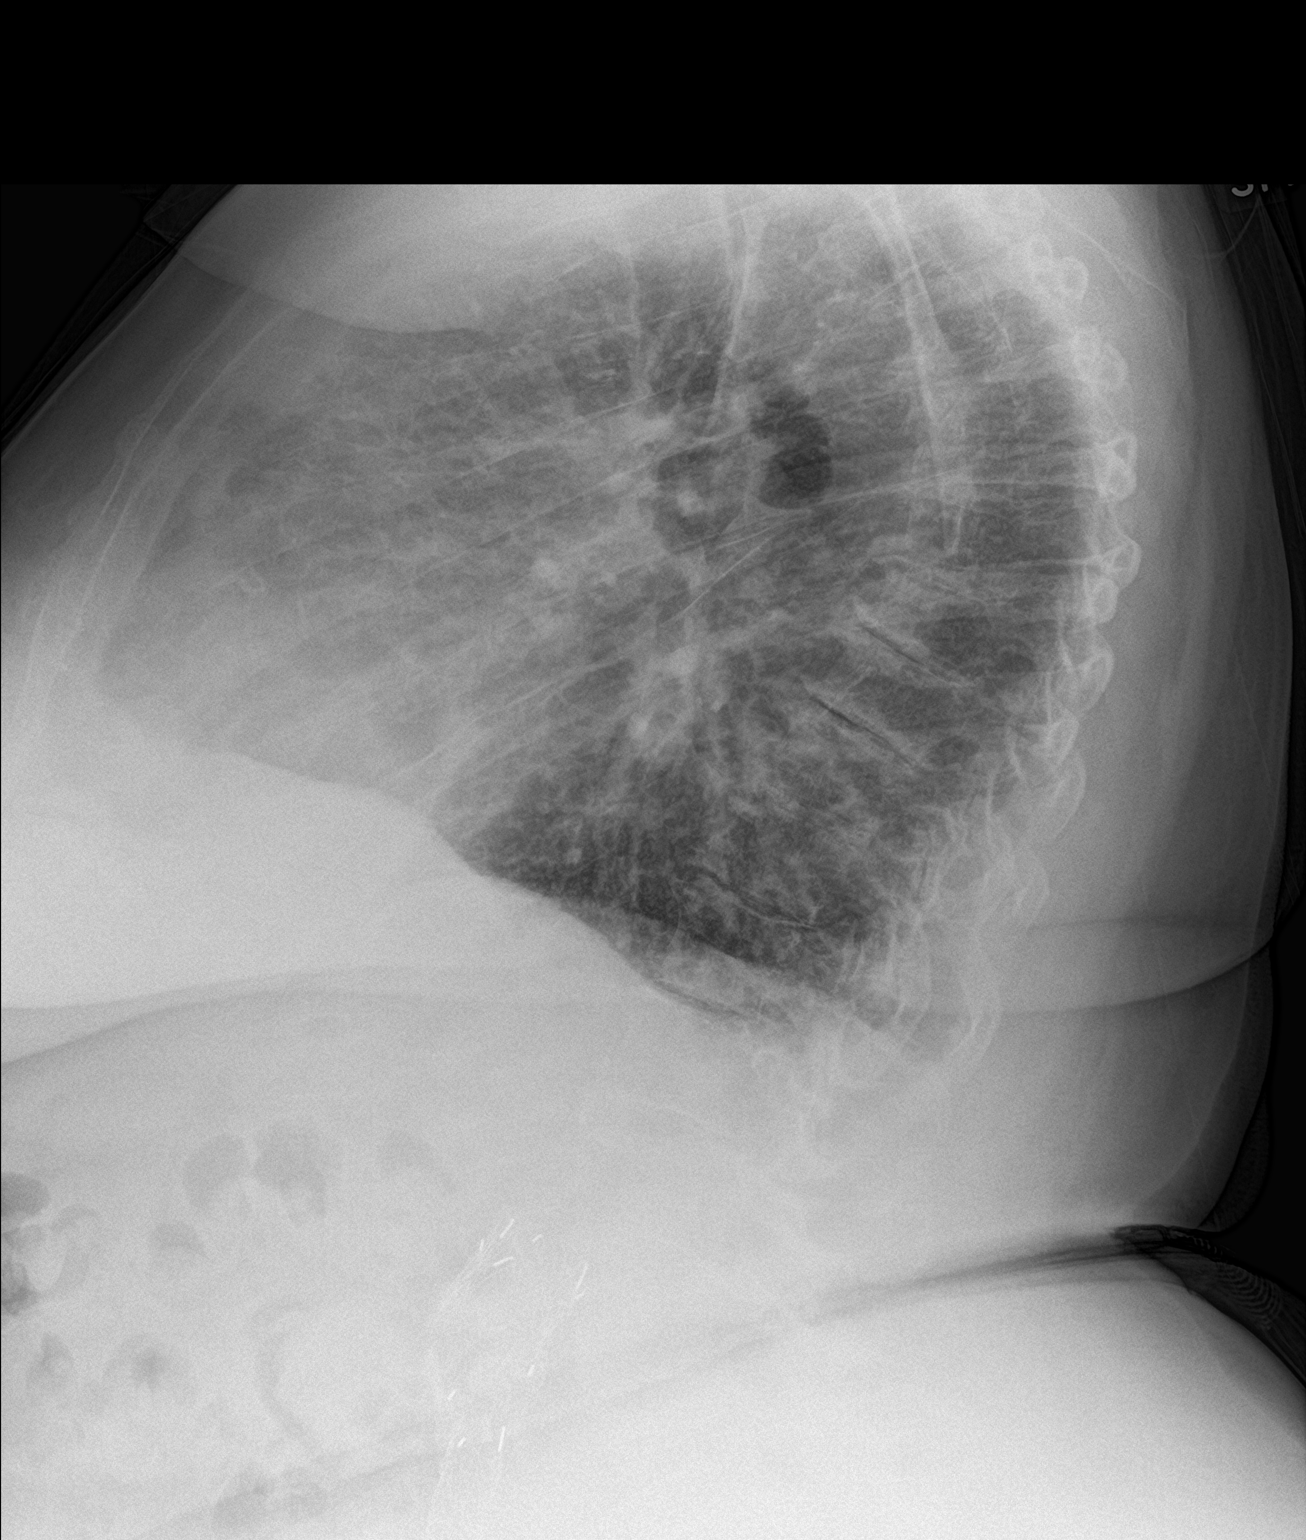

[2 of 2 positions shown; findings below may reference images not displayed]

FINDINGS: Mediastinum hilar structures normal. Cardiomegaly normal pulmonary
vascularity. Bilateral pulmonary interstitial prominence noted.
These findings suggest mild congestive heart failure. Interstitial
prominence is improved slightly from prior exam. Other etiologies of
interstitial disease cannot be excluded. Small bilateral pleural
effusions cannot be excluded. Diffuse degenerative changes
osteopenia thoracic spine. Abdominal aortic stent graft noted .
IMPRESSION: Congestive heart failure mild pulmonary interstitial edema.
Pulmonary interstitial changes have improved slightly from prior
exam. Other etiologies of interstitial lung disease cannot be
excluded. Small bilateral pleural effusions cannot be excluded.

## 2015-12-05 NOTE — Telephone Encounter (Signed)
Sorry, I actually do not have any papers on patient.

## 2015-12-05 NOTE — Telephone Encounter (Signed)
This entire message is regarding patient assistance for [employer] for patient.  We have [place] paperwork in office that can be filled out for patient.   Forwarding to KW to follow up on.  Thanks.

## 2015-12-05 NOTE — Telephone Encounter (Signed)
Katie, did you receive anything yet?

## 2015-12-05 NOTE — Telephone Encounter (Signed)
I have forms and working on them now.

## 2015-12-07 NOTE — Telephone Encounter (Signed)
Attempted to contact pt. No answer, no option to leave a message. Will try back.  

## 2015-12-10 DIAGNOSIS — E038 Other specified hypothyroidism: Secondary | ICD-10-CM | POA: Diagnosis not present

## 2015-12-10 DIAGNOSIS — E784 Other hyperlipidemia: Secondary | ICD-10-CM | POA: Diagnosis not present

## 2015-12-10 DIAGNOSIS — I10 Essential (primary) hypertension: Secondary | ICD-10-CM | POA: Diagnosis not present

## 2015-12-10 NOTE — Telephone Encounter (Signed)
lmtcb for pt.  

## 2015-12-10 NOTE — Telephone Encounter (Signed)
[phone] pt calling back

## 2015-12-10 NOTE — Telephone Encounter (Signed)
Spoke with Amy @ [employer]. She states that form was received back via fax with CY's signature. They have faxed completed form to [employer] and are waiting on approval. Nothing further needed at this time.

## 2015-12-17 DIAGNOSIS — E784 Other hyperlipidemia: Secondary | ICD-10-CM | POA: Diagnosis not present

## 2015-12-17 DIAGNOSIS — I714 Abdominal aortic aneurysm, without rupture: Secondary | ICD-10-CM | POA: Diagnosis not present

## 2015-12-17 DIAGNOSIS — C3490 Malignant neoplasm of unspecified part of unspecified bronchus or lung: Secondary | ICD-10-CM | POA: Diagnosis not present

## 2015-12-17 DIAGNOSIS — J449 Chronic obstructive pulmonary disease, unspecified: Secondary | ICD-10-CM | POA: Diagnosis not present

## 2015-12-17 DIAGNOSIS — D692 Other nonthrombocytopenic purpura: Secondary | ICD-10-CM | POA: Diagnosis not present

## 2015-12-17 DIAGNOSIS — Z0000 Encounter for general adult medical examination without abnormal findings: Secondary | ICD-10-CM | POA: Diagnosis not present

## 2015-12-17 DIAGNOSIS — I480 Paroxysmal atrial fibrillation: Secondary | ICD-10-CM | POA: Diagnosis not present

## 2015-12-17 DIAGNOSIS — I10 Essential (primary) hypertension: Secondary | ICD-10-CM | POA: Diagnosis not present

## 2015-12-17 DIAGNOSIS — F329 Major depressive disorder, single episode, unspecified: Secondary | ICD-10-CM | POA: Diagnosis not present

## 2015-12-17 DIAGNOSIS — D508 Other iron deficiency anemias: Secondary | ICD-10-CM | POA: Diagnosis not present

## 2015-12-17 DIAGNOSIS — Z1389 Encounter for screening for other disorder: Secondary | ICD-10-CM | POA: Diagnosis not present

## 2015-12-19 ENCOUNTER — Emergency Department (HOSPITAL_COMMUNITY)
Admission: EM | Admit: 2015-12-19 | Discharge: 2015-12-19 | Disposition: A | Discharge status: Home / Self Care | Payer: Medicare Other | Attending: Emergency Medicine | Admitting: Emergency Medicine

## 2015-12-19 ENCOUNTER — Encounter (HOSPITAL_COMMUNITY): Payer: Self-pay | Admitting: Emergency Medicine

## 2015-12-19 DIAGNOSIS — Z79899 Other long term (current) drug therapy: Secondary | ICD-10-CM | POA: Insufficient documentation

## 2015-12-19 DIAGNOSIS — R040 Epistaxis: Secondary | ICD-10-CM | POA: Diagnosis not present

## 2015-12-19 DIAGNOSIS — J449 Chronic obstructive pulmonary disease, unspecified: Secondary | ICD-10-CM | POA: Insufficient documentation

## 2015-12-19 DIAGNOSIS — E039 Hypothyroidism, unspecified: Secondary | ICD-10-CM | POA: Insufficient documentation

## 2015-12-19 DIAGNOSIS — E785 Hyperlipidemia, unspecified: Secondary | ICD-10-CM | POA: Diagnosis not present

## 2015-12-19 DIAGNOSIS — I10 Essential (primary) hypertension: Secondary | ICD-10-CM | POA: Insufficient documentation

## 2015-12-19 DIAGNOSIS — Z96652 Presence of left artificial knee joint: Secondary | ICD-10-CM | POA: Diagnosis not present

## 2015-12-19 DIAGNOSIS — Z7901 Long term (current) use of anticoagulants: Secondary | ICD-10-CM | POA: Diagnosis not present

## 2015-12-19 DIAGNOSIS — Z85118 Personal history of other malignant neoplasm of bronchus and lung: Secondary | ICD-10-CM | POA: Diagnosis not present

## 2015-12-19 DIAGNOSIS — Z87891 Personal history of nicotine dependence: Secondary | ICD-10-CM | POA: Insufficient documentation

## 2015-12-19 LAB — CBC
HCT: 45 % (ref 36.0–46.0)
Hemoglobin: 15 g/dL (ref 12.0–15.0)
MCH: 33.9 pg (ref 26.0–34.0)
MCHC: 33.3 g/dL (ref 30.0–36.0)
MCV: 101.8 fL — ABNORMAL HIGH (ref 78.0–100.0)
Platelets: 135 10*3/uL — ABNORMAL LOW (ref 150–400)
RBC: 4.42 MIL/uL (ref 3.87–5.11)
RDW: 13.1 % (ref 11.5–15.5)
WBC: 5.3 10*3/uL (ref 4.0–10.5)

## 2015-12-19 LAB — BASIC METABOLIC PANEL
Anion gap: 5 (ref 5–15)
BUN: 16 mg/dL (ref 6–20)
CO2: 27 mmol/L (ref 22–32)
Calcium: 9.2 mg/dL (ref 8.9–10.3)
Chloride: 106 mmol/L (ref 101–111)
Creatinine, Ser: 0.8 mg/dL (ref 0.44–1.00)
GFR calc Af Amer: >60 mL/min (ref >60)
GFR calc non Af Amer: >60 mL/min (ref >60)
Glucose, Bld: 115 mg/dL — ABNORMAL HIGH (ref 65–99)
Potassium: 4.5 mmol/L (ref 3.5–5.1)
Sodium: 138 mmol/L (ref 135–145)

## 2015-12-19 MED ORDER — COCAINE HCL 4 % EX SOLN
4.0000 mL | Freq: Once | CUTANEOUS | Status: DC
  Filled 2015-12-19: qty 4

## 2015-12-19 MED ORDER — SILVER NITRATE-POT NITRATE 75-25 % EX MISC
1.0000 | Freq: Once | CUTANEOUS | Status: DC
  Filled 2015-12-19: qty 1

## 2015-12-19 MED ORDER — CEPHALEXIN 500 MG PO CAPS: 500.0000 mg | ORAL_CAPSULE | Freq: Four times a day (QID) | ORAL | Status: DC

## 2015-12-19 NOTE — ED Provider Notes (Signed)
CSN: [id number]     Arrival date & time 12/19/15  6378 History   First MD Initiated Contact with Patient 12/19/15 0901     Chief Complaint  Patient presents with  . Epistaxis   HPI Patient presents to the emergency room for evaluation of a nosebleed. The patient has a history of multiple medical problems including COPD and DVT.   The patient takes Xarelto. This morning when she woke up she went to scratch her left nose and after that noticed bleeding. She felt like there was something in her nose and that is why she scratched so it's possible it already started to bleed. Patient denies any previous episodes and nosebleeds. She is on chronic nasal cannula oxygen for her COPD. Her nose is been bleeding now for several hours and she has not been able to get it to stop. She denies any lightheadedness. No chest pain. No shortness of breath. Past Medical History  Diagnosis Date  . COPD (chronic obstructive pulmonary disease) ([place])   . DVT (deep venous thrombosis) ([place]) 2004    BLE  . AAA (abdominal aortic aneurysm) ([place])   . Hypothyroidism   . Hyperlipidemia   . History of uterine fibroid   . Hiatal hernia   . History of fibrocystic disease of breast   . HTN (hypertension)   . Anxiety and depression   . PAD (peripheral artery disease) ([place])   . Ovarian mass     right benign  . Anticoagulant long-term use     Failed on Coumadin. On Xarelto  . Atrial fib/flutter, transient June 2012  . Normal nuclear stress test 2012    May 2012  . Esophageal dysmotility   . History of bronchitis   . Exertional dyspnea   . OSA (obstructive sleep apnea)     "haven't been using my CPAP lately" (01/21/12)  . OA (osteoarthritis)     "knees; left shoulder"  . Chronic lower back pain   . Anxiety   . Small cell carcinoma of lung ([place]) 2004    NON-SMALL CELL CARCINOMA OF THE LUNG, METASTATIC TO THE SUPRACLAVICULAR AND MEDIASTINAL LYMPH NODES; in remission  . Ovarian mass     benign, right   Past Surgical  History  Procedure Laterality Date  . Abdominal aortic aneurysm repair  ~ 2010    stent graft  . Replacement total knee Left ~ 2008    left  . Thyroidectomy  ~ 1964  . Bladder surgery  ~ 2003    sling  . Cardiovascular stress test  2012    No ischemia  . Breast biopsy  1960's    both breast's - benign  . Cataract extraction w/ intraocular lens  implant, bilateral Bilateral ~ 2009   Family History  Problem Relation Age of Onset  . Heart disease Father   . Heart attack Father   . Diabetes type II Mother   . Diabetes Mother   . Hypertension Mother   . Heart disease Brother     before age 34  . Heart disease Sister     See's Dr. Acie Fredrickson   Social History  Substance Use Topics  . Smoking status: Former Smoker -- 1.00 packs/day for 40 years    Types: Cigarettes    Quit date: 06/23/2001  . Smokeless tobacco: Never Used  . Alcohol Use: 0.0 oz/week    0 Standard drinks or equivalent per week     Comment: once a week (glass of wine)   OB History  No data available     Review of Systems  All other systems reviewed and are negative.     Allergies  Review of patient's allergies indicates no known allergies.  Home Medications   Prior to Admission medications   Medication Sig Start Date End Date Taking? Authorizing Provider  albuterol (PROVENTIL HFA;VENTOLIN HFA) 108 (90 Base) MCG/ACT inhaler Inhale 2 puffs into the lungs every 6 (six) hours as needed for wheezing or shortness of breath. 07/09/15  Yes Deneise Lever, MD  atorvastatin (LIPITOR) 10 MG tablet Take 1 tablet by mouth daily. 02/23/13  Yes Historical Provider, MD  CALCIUM-MAGNESIUM-ZINC PO Take 1 tablet by mouth daily.    Yes Historical Provider, MD  Cholecalciferol (VITAMIN D) 1000 UNITS capsule Take 1,000 Units by mouth daily.     Yes Historical Provider, MD  citalopram (CELEXA) 20 MG tablet Take 20 mg by mouth daily.    Yes Historical Provider, MD  diltiazem (CARTIA XT) 120 MG 24 hr capsule Take 1 capsule (120  mg total) by mouth daily. 06/26/15  Yes Peter M Martinique, MD  furosemide (LASIX) 40 MG tablet Take 1 tablet (40 mg total) by mouth 2 (two) times daily. Patient taking differently: Take 40 mg by mouth daily.  06/26/15  Yes Peter M Martinique, MD  levothyroxine (SYNTHROID, LEVOTHROID) 88 MCG tablet Take 88 mcg by mouth daily.   Yes Historical Provider, MD  losartan (COZAAR) 50 MG tablet Take 1 tablet (50 mg total) by mouth daily. 05/21/12  Yes Peter M Martinique, MD  metoprolol tartrate (LOPRESSOR) 25 MG tablet Take 0.5 tablets (12.5 mg total) by mouth 2 (two) times daily. 05/04/15  Yes Peter M Martinique, MD  morphine (MSIR) 15 MG tablet Take 15 mg by mouth every 4 (four) hours as needed. Reported on 12/19/2015   Yes Historical Provider, MD  NON FORMULARY daily. 2 liters oxygen   Yes Historical Provider, MD  potassium chloride SA (K-DUR,KLOR-CON) 20 MEQ tablet Take 1 tablet (20 mEq total) by mouth 2 (two) times daily. Patient taking differently: Take 20 mEq by mouth daily.  04/17/15 08/26/17 Yes Burtis Junes, NP  temazepam (RESTORIL) 15 MG capsule TAKE 1 CAPSULE AT BEDTIME AS NEEDED FOR SLEEP 01/31/15  Yes Deneise Lever, MD  tiotropium (SPIRIVA HANDIHALER) 18 MCG inhalation capsule INHALE THE CONTENTS OF 1 CAPSULE USING HANDIHALER DEVICE ONCE DAILY AS DIRECTED 03/05/15  Yes Deneise Lever, MD  XARELTO 20 MG TABS tablet TAKE 1 TABLET BY MOUTH DAILY WITH SUPPER 05/29/15  Yes Peter M Martinique, MD  cephALEXin (KEFLEX) 500 MG capsule Take 1 capsule (500 mg total) by mouth 4 (four) times daily. 12/19/15   Dorie Rank, MD   BP 142/86 mmHg  Pulse 66  Temp(Src) 97.9 F (36.6 C) (Oral)  Resp 24  SpO2 90% Physical Exam  Constitutional: She appears well-developed and well-nourished. No distress.  HENT:  Head: Normocephalic and atraumatic.  Right Ear: External ear normal.  Left Ear: External ear normal.  Nose: Epistaxis is observed.  Active bleeding from the left nares,  using a headlamp and suctioning, the bleeding appears  to be coming from a small vessel in the anterior aspect of the left nasal septum  Eyes: Conjunctivae are normal. Right eye exhibits no discharge. Left eye exhibits no discharge. No scleral icterus.  Neck: Neck supple. No tracheal deviation present.  Cardiovascular: Normal rate.   Pulmonary/Chest: Effort normal. No stridor. No respiratory distress.  Musculoskeletal: She exhibits no edema.  Neurological: She is alert.  Cranial nerve deficit: no gross deficits.  Skin: Skin is warm and dry. No rash noted.  Psychiatric: She has a normal mood and affect.  Nursing note and vitals reviewed.   ED Course  .Epistaxis Management Date/Time: 12/19/2015 10:51 AM Performed by: Dorie Rank Authorized by: Dorie Rank Consent: Verbal consent obtained. Risks and benefits: risks, benefits and alternatives were discussed Local anesthetic: topical 4% cocaine. Patient sedated: no Treatment site: left anterior Repair method: suction, silver nitrate and nasal balloon Post-procedure assessment: bleeding stopped Treatment complexity: simple Patient tolerance: Patient tolerated the procedure well with no immediate complications   (including critical care time) Labs Review Labs Reviewed  CBC - Abnormal; Notable for the following:    MCV 101.8 (*)    Platelets 135 (*)    All other components within normal limits  BASIC METABOLIC PANEL - Abnormal; Notable for the following:    Glucose, Bld 115 (*)    All other components within normal limits     MDM   Final diagnoses:  Epistaxis    I was able to locate the vessel during my exam. Unfortunately bleeding did not stop with application of topical cocaine and silver nitrate. A 5.5 cm nasal balloon was placed. Patient was monitored in the emergency room the bleeding was controlled. Plan on discharge home with ENT follow-up. Patient will follow up with her ENT doctors in [place]. I will give her a short course of Keflex as a preventative measure    Dorie Rank, MD 12/19/15 1055

## 2015-12-19 NOTE — ED Notes (Signed)
EDP at bedside  

## 2015-12-19 NOTE — ED Notes (Signed)
Per EMS: pt from home c/o nose bleed starting today around 0600; pt on xerelto; pt with active bleeding upon arrival

## 2015-12-19 NOTE — Discharge Instructions (Signed)

## 2015-12-21 ENCOUNTER — Telehealth: Payer: Self-pay | Admitting: Nurse Practitioner

## 2015-12-21 NOTE — Telephone Encounter (Signed)
New Message:   Please call,pt's nose have bleeding a little again. Should she stop taking her Xarelto?

## 2015-12-24 DIAGNOSIS — R040 Epistaxis: Secondary | ICD-10-CM | POA: Diagnosis not present

## 2015-12-24 NOTE — Telephone Encounter (Signed)
I s/w with Cecille Rubin and pt is not to stop xarelto.  If pt starts actively bleeding again who ever pt goes to needs to contact our office or Dr. Theodis Sato office to discuss xarelto.  I disucssed in length the complication's of a stroke with pt's age,chronic A-fib and being a woman.  Pt understood and agreeable to plan.

## 2015-12-24 NOTE — Telephone Encounter (Signed)
Call pt back to f/u on phone call

## 2015-12-24 NOTE — Telephone Encounter (Signed)
S/w pt went to ED to stop nose bleed did a suction, silver nitrate and nasal balloon.  Pt saw ENT and took packing out. Stated still some discolor.  Stated if nosebleed starts back can pt go off of xarelto for 8-10 days??  Than pt stated would it be ok to go off xarelto now.  I stated about the risk of stroke pt stated understanding.  I stated would send to Truitt Merle, NP to see what provider says.  Stated was on vacation but would get back to me. Will route to [employer]

## 2015-12-24 NOTE — Telephone Encounter (Signed)
F/u  Pt wanting to know if her nose bleeds again, can she stop xarelto. Please call back and discuss.  Can also use her cell #- [phone]

## 2016-01-04 ENCOUNTER — Telehealth: Payer: Self-pay | Admitting: Nurse Practitioner

## 2016-01-04 MED ORDER — DILTIAZEM HCL ER COATED BEADS 120 MG PO CP24: 120.0000 mg | ORAL_CAPSULE | Freq: Every day | ORAL | Status: DC

## 2016-01-04 NOTE — Telephone Encounter (Signed)
Rx has been sent to the pharmacy electronically. Diltiazem 120 mg # 30 0 refills send into pt pharmacy

## 2016-01-04 NOTE — Telephone Encounter (Signed)
New message       *STAT* If patient is at the pharmacy, call can be transferred to refill team.   1. Which medications need to be refilled? (please list name of each medication and dose if known) diltiazem '120mg'$   2. Which pharmacy/location (including street and city if local pharmacy) is medication to be sent to[facility] 3. Do they need a 30 day or 90 day supply? Pt want 3 or 4 pills.  She has been waiting for her mail order presc.  It did not come today.  Pt has been out of medication for several days.  [facility] [place].  Call if there is a problem

## 2016-01-17 ENCOUNTER — Other Ambulatory Visit: Payer: Self-pay | Admitting: Internal Medicine

## 2016-02-04 ENCOUNTER — Other Ambulatory Visit: Payer: Self-pay | Admitting: Internal Medicine

## 2016-02-04 DIAGNOSIS — Z1231 Encounter for screening mammogram for malignant neoplasm of breast: Secondary | ICD-10-CM

## 2016-02-27 ENCOUNTER — Ambulatory Visit: Payer: Medicare Other | Admitting: Internal Medicine

## 2016-02-28 DIAGNOSIS — Z79891 Long term (current) use of opiate analgesic: Secondary | ICD-10-CM | POA: Diagnosis not present

## 2016-02-28 DIAGNOSIS — M5136 Other intervertebral disc degeneration, lumbar region: Secondary | ICD-10-CM | POA: Diagnosis not present

## 2016-02-28 DIAGNOSIS — G894 Chronic pain syndrome: Secondary | ICD-10-CM | POA: Diagnosis not present

## 2016-02-28 DIAGNOSIS — M25512 Pain in left shoulder: Secondary | ICD-10-CM | POA: Diagnosis not present

## 2016-03-05 ENCOUNTER — Other Ambulatory Visit: Payer: Self-pay | Admitting: Cardiology

## 2016-03-06 DIAGNOSIS — J449 Chronic obstructive pulmonary disease, unspecified: Secondary | ICD-10-CM | POA: Diagnosis not present

## 2016-03-06 DIAGNOSIS — M542 Cervicalgia: Secondary | ICD-10-CM | POA: Diagnosis not present

## 2016-03-06 DIAGNOSIS — J209 Acute bronchitis, unspecified: Secondary | ICD-10-CM | POA: Diagnosis not present

## 2016-03-06 DIAGNOSIS — Z6841 Body Mass Index (BMI) 40.0 and over, adult: Secondary | ICD-10-CM | POA: Diagnosis not present

## 2016-03-06 DIAGNOSIS — R51 Headache: Secondary | ICD-10-CM | POA: Diagnosis not present

## 2016-03-10 ENCOUNTER — Ambulatory Visit: Payer: Medicare Other

## 2016-03-10 DIAGNOSIS — E785 Hyperlipidemia, unspecified: Secondary | ICD-10-CM | POA: Insufficient documentation

## 2016-03-11 ENCOUNTER — Encounter: Payer: Self-pay | Admitting: Vascular Surgery

## 2016-03-11 ENCOUNTER — Ambulatory Visit
Admission: RE | Admit: 2016-03-11 | Discharge: 2016-03-11 | Disposition: A | Discharge status: Home / Self Care | Payer: Medicare Other | Source: Ambulatory Visit | Attending: Vascular Surgery | Admitting: Vascular Surgery

## 2016-03-11 DIAGNOSIS — I712 Thoracic aortic aneurysm, without rupture, unspecified: Secondary | ICD-10-CM

## 2016-03-11 DIAGNOSIS — E785 Hyperlipidemia, unspecified: Secondary | ICD-10-CM

## 2016-03-11 MED ORDER — IOPAMIDOL (ISOVUE-370) INJECTION 76%
80.0000 mL | Freq: Once | INTRAVENOUS | Status: AC | PRN
  Administered 2016-03-11: 80 mL via INTRAVENOUS

## 2016-03-12 ENCOUNTER — Other Ambulatory Visit: Payer: Self-pay | Admitting: Cardiology

## 2016-03-18 ENCOUNTER — Encounter: Payer: Self-pay | Admitting: Vascular Surgery

## 2016-03-18 ENCOUNTER — Ambulatory Visit
Admission: RE | Admit: 2016-03-18 | Discharge: 2016-03-18 | Disposition: A | Discharge status: Home / Self Care | Payer: Medicare Other | Source: Ambulatory Visit | Attending: Internal Medicine | Admitting: Internal Medicine

## 2016-03-18 ENCOUNTER — Ambulatory Visit (INDEPENDENT_AMBULATORY_CARE_PROVIDER_SITE_OTHER): Payer: Medicare Other | Admitting: Vascular Surgery

## 2016-03-18 VITALS — BP 112/68 | HR 59 | Temp 97.4°F | Resp 22 | Ht 62.0 in | Wt 252.3 lb

## 2016-03-18 DIAGNOSIS — I712 Thoracic aortic aneurysm, without rupture, unspecified: Secondary | ICD-10-CM

## 2016-03-18 DIAGNOSIS — I714 Abdominal aortic aneurysm, without rupture, unspecified: Secondary | ICD-10-CM

## 2016-03-18 DIAGNOSIS — Z1231 Encounter for screening mammogram for malignant neoplasm of breast: Secondary | ICD-10-CM | POA: Diagnosis not present

## 2016-03-18 NOTE — Progress Notes (Signed)
Patient name: [name] MRN: [id number] DOB: 12-04-1940 Sex: female  REASON FOR VISIT: Follow-up of thoracic aortic aneurysm and follow-up of stent graft repair abdominal aortic aneurysm  HPI: [name] is a 75 y.o. female here today for follow-up. She continues to have significant pulmonary compromise. She reports that she is unable to wear her CPAP at night. Does wear continuous oxygen therapy. She denies any new cardiac difficulty. Is on Xarelto for cardiac irregularity. She denies any chest pain or other symptoms related to her thoracic and  Past Medical History:  Diagnosis Date  . AAA (abdominal aortic aneurysm) ([place])   . Anticoagulant long-term use    Failed on Coumadin. On Xarelto  . Anxiety   . Anxiety and depression   . Atrial fib/flutter, transient June 2012  . Chronic lower back pain   . COPD (chronic obstructive pulmonary disease) ([place])   . DVT (deep venous thrombosis) ([place]) 2004   BLE  . Esophageal dysmotility   . Exertional dyspnea   . Hiatal hernia   . History of bronchitis   . History of fibrocystic disease of breast   . History of uterine fibroid   . HTN (hypertension)   . Hyperlipidemia   . Hypothyroidism   . Normal nuclear stress test 2012   May 2012  . OA (osteoarthritis)    "knees; left shoulder"  . OSA (obstructive sleep apnea)    "haven't been using my CPAP lately" (01/21/12)  . Ovarian mass    right benign  . Ovarian mass    benign, right  . PAD (peripheral artery disease) ([place])   . Small cell carcinoma of lung ([place]) 2004   NON-SMALL CELL CARCINOMA OF THE LUNG, METASTATIC TO THE SUPRACLAVICULAR AND MEDIASTINAL LYMPH NODES; in remission    Family History  Problem Relation Age of Onset  . Heart disease Father   . Heart attack Father   . Diabetes type II Mother   . Diabetes Mother   . Hypertension Mother   . Heart disease Brother     before age 70  . Heart disease Sister     See's Dr. Acie Fredrickson    SOCIAL HISTORY: Social History    Substance Use Topics  . Smoking status: Former Smoker    Packs/day: 1.00    Years: 40.00    Types: Cigarettes    Quit date: 06/23/2001  . Smokeless tobacco: Never Used  . Alcohol use 0.0 oz/week     Comment: once a week (glass of wine)    No Known Allergies  Current Outpatient Prescriptions  Medication Sig Dispense Refill  . albuterol (PROVENTIL HFA;VENTOLIN HFA) 108 (90 Base) MCG/ACT inhaler Inhale 2 puffs into the lungs every 6 (six) hours as needed for wheezing or shortness of breath. 1 Inhaler 2  . atorvastatin (LIPITOR) 10 MG tablet Take 1 tablet by mouth daily.    Marland Kitchen CALCIUM-MAGNESIUM-ZINC PO Take 1 tablet by mouth daily.     Marland Kitchen CARTIA XT 120 MG 24 hr capsule TAKE 1 CAPSULE BY MOUTH DAILY. 90 capsule 2  . cephALEXin (KEFLEX) 500 MG capsule Take 1 capsule (500 mg total) by mouth 4 (four) times daily. 20 capsule 0  . Cholecalciferol (VITAMIN D) 1000 UNITS capsule Take 1,000 Units by mouth daily.      . citalopram (CELEXA) 20 MG tablet Take 20 mg by mouth daily.     . furosemide (LASIX) 40 MG tablet Take 1 tablet (40 mg total) by mouth 2 (two) times daily. (  Patient taking differently: Take 40 mg by mouth daily. ) 180 tablet 2  . levothyroxine (SYNTHROID, LEVOTHROID) 88 MCG tablet Take 88 mcg by mouth daily.    Marland Kitchen losartan (COZAAR) 50 MG tablet Take 1 tablet (50 mg total) by mouth daily. 30 tablet 1  . metoprolol tartrate (LOPRESSOR) 25 MG tablet TAKE 1/2 TABLET TWICE DAILY 90 tablet 3  . morphine (MSIR) 15 MG tablet Take 15 mg by mouth every 4 (four) hours as needed. Reported on 12/19/2015    . NON FORMULARY daily. 2 liters oxygen    . potassium chloride SA (K-DUR,KLOR-CON) 20 MEQ tablet Take 1 tablet (20 mEq total) by mouth 2 (two) times daily. (Patient taking differently: Take 20 mEq by mouth daily. ) 180 tablet 3  . SPIRIVA HANDIHALER 18 MCG inhalation capsule PLACE 1 CAPSULE INTO INHALER AND INHALE ONCE DAILY AS DIRECTED 30 capsule 6  . temazepam (RESTORIL) 15 MG capsule TAKE 1  CAPSULE AT BEDTIME AS NEEDED FOR SLEEP 90 capsule 1  . XARELTO 20 MG TABS tablet TAKE 1 TABLET BY MOUTH DAILY WITH SUPPER 30 tablet 5   No current facility-administered medications for this visit.     REVIEW OF SYSTEMS:  '[X]'$  denotes positive finding, '[ ]'$  denotes negative finding Cardiac  Comments:  Chest pain or chest pressure:    Shortness of breath upon exertion:    Short of breath when lying flat:    Irregular heart rhythm:        Vascular    Pain in calf, thigh, or hip brought on by ambulation:    Pain in feet at night that wakes you up from your sleep:     Blood clot in your veins:    Leg swelling:         Pulmonary    Oxygen at home:    Productive cough:     Wheezing:         Neurologic    Sudden weakness in arms or legs:     Sudden numbness in arms or legs:     Sudden onset of difficulty speaking or slurred speech:    Temporary loss of vision in one eye:     Problems with dizziness:         Gastrointestinal    Blood in stool:     Vomited blood:         Genitourinary    Burning when urinating:     Blood in urine:        Psychiatric    Major depression:         Hematologic    Bleeding problems:    Problems with blood clotting too easily:        Skin    Rashes or ulcers:        Constitutional    Fever or chills:      PHYSICAL EXAM: Vitals:   03/18/16 0821  BP: 112/68  Pulse: (!) 59  Resp: (!) 22  Temp: 97.4 F (36.3 C)  TempSrc: Oral  SpO2: 92%  Weight: 114.4 kg (252 lb 4.8 oz)  Height: '5\' 2"'$  (1.575 m)    GENERAL: The patient is a Morbidly obese female, in no acute distress. The vital signs are documented above. Does have oxygen therapy with some shortness of breath just with conversation CARDIAC: There is a regular rate and rhythm.  VASCULAR: 2+ radial pulses bilaterally PULMONARY: There is good air exchange bilaterally without wheezing or rales. ABDOMEN: Soft and non-tender with  normal pitched bowel sounds. No aneurysm  palpable MUSCULOSKELETAL: There are no major deformities or cyanosis. NEUROLOGIC: No focal weakness or paresthesias are detected. SKIN: There are no ulcers or rashes noted. PSYCHIATRIC: The patient has a normal affect.  DATA:   CT of her chest was reviewed. This shows no change in her mid thoracic saccular aneurysm. Saccular aneurysm itself is approximate 3 cm in diameter. Total transverse diameter including the saccular aneurysm in her native aorta is partially 5.8 cm  MEDICAL ISSUES:  Stable overall. Fortunately she has had no change in 6 month interval. I have recommended continued follow-up only. She would be at the very high risk for open surgery with her pulmonary compromise. Also be very difficult for stent graft repair of her thoracic aorta since she has small external iliac arteries and a bifurcated stent graft in place for treatment of her infrarenal aneurysm. I she understands this. Also understands that if she did have rupture of her thoracic aorta to be very little chance she would survive this. We will see her again in one year with CT of her chest abdomen and pelvis for follow-up of her thoracic aneurysm and follow-up of her infrarenal aortic stent graft repair    Naomie Crow [facility]

## 2016-04-23 DIAGNOSIS — Z961 Presence of intraocular lens: Secondary | ICD-10-CM | POA: Diagnosis not present

## 2016-04-23 DIAGNOSIS — H53002 Unspecified amblyopia, left eye: Secondary | ICD-10-CM | POA: Diagnosis not present

## 2016-04-23 DIAGNOSIS — H52201 Unspecified astigmatism, right eye: Secondary | ICD-10-CM | POA: Diagnosis not present

## 2016-05-09 NOTE — Progress Notes (Signed)
CARDIOLOGY OFFICE NOTE  Date:  05/12/2016    [name] Date of Birth: 1941/02/09 Medical Record #[id number]  PCP:  Geoffery Lyons, MD  Cardiologist:  Beila Purdie Martinique  MD  Chief Complaint  Patient presents with  . Follow-up    per Truitt Merle, NP  pt c/o chest discomfort on right side; constant SOB; swelling in legs/feet/ankles, bilateral--is down today; occasional dizziness--feels like she may pass out  . Atrial Fibrillation  . Shortness of Breath    History of Present Illness: [name] is a 75 y.o. female who presents today for a follow up visit. Seen for Dr. Martinique. She has chronic dyspnea. Has had past normal nuclear and echo. Does have moderate pulmonary HTN related to chronic COPD. Former smoker. Has had lung cancer treated with XRT and chemo. Other issues include OSA, past DVT, paroxysmal atrial flutter. She has had prior aorto bi-iliac stent grafting per Dr. Donnetta Hutching. Has a known thoracic aneurysm - last measured in March of 2017 and is 4.5 x 3.6 x 2.8 - continuing to be followed by Dr. Donnetta Hutching. Had her DVT while on coumadin. Currently on Xarelto. Has had issues with beta blocker and bradycardia in the past - and only able to tolerate low dose.   Seen in October of 2016 -was felt to be stable but with progressive dyspnea. Mostly limited by back pain. Chronically short of breath. Labs are checked by Dr. Reynaldo Minium. On oxygen at night.   Seen by Dr. Donnetta Hutching in September. Thoracic aneurysm stable 3.0 cm. Plan to follow up in one year.   On follow up today  she complains of chronic dyspnea.  No chest pain. No cough. Legs remain full. Notes Afib starting this am.   Past Medical History:  Diagnosis Date  . AAA (abdominal aortic aneurysm) ([place])   . Anticoagulant long-term use    Failed on Coumadin. On Xarelto  . Anxiety   . Anxiety and depression   . Atrial fib/flutter, transient June 2012  . Chronic lower back pain   . COPD (chronic obstructive pulmonary disease) ([place])     . DVT (deep venous thrombosis) ([place]) 2004   BLE  . Esophageal dysmotility   . Exertional dyspnea   . Hiatal hernia   . History of bronchitis   . History of fibrocystic disease of breast   . History of uterine fibroid   . HTN (hypertension)   . Hyperlipidemia   . Hypothyroidism   . Normal nuclear stress test 2012   May 2012  . OA (osteoarthritis)    "knees; left shoulder"  . OSA (obstructive sleep apnea)    "haven't been using my CPAP lately" (01/21/12)  . Ovarian mass    right benign  . Ovarian mass    benign, right  . PAD (peripheral artery disease) ([place])   . Small cell carcinoma of lung ([place]) 2004   NON-SMALL CELL CARCINOMA OF THE LUNG, METASTATIC TO THE SUPRACLAVICULAR AND MEDIASTINAL LYMPH NODES; in remission    Past Surgical History:  Procedure Laterality Date  . ABDOMINAL AORTIC ANEURYSM REPAIR  ~ 2010   stent graft  . BLADDER SURGERY  ~ 2003   sling  . BREAST BIOPSY  1960's   both breast's - benign  . CARDIOVASCULAR STRESS TEST  2012   No ischemia  . CATARACT EXTRACTION W/ INTRAOCULAR LENS  IMPLANT, BILATERAL Bilateral ~ 2009  . REPLACEMENT TOTAL KNEE Left ~ 2008   left  . THYROIDECTOMY  ~ 1964  Medications: Current Outpatient Prescriptions  Medication Sig Dispense Refill  . albuterol (PROVENTIL HFA;VENTOLIN HFA) 108 (90 Base) MCG/ACT inhaler Inhale 2 puffs into the lungs every 6 (six) hours as needed for wheezing or shortness of breath. 1 Inhaler 2  . atorvastatin (LIPITOR) 10 MG tablet Take 1 tablet by mouth daily.    Marland Kitchen CALCIUM-MAGNESIUM-ZINC PO Take 1 tablet by mouth daily.     Marland Kitchen CARTIA XT 120 MG 24 hr capsule TAKE 1 CAPSULE BY MOUTH DAILY. 90 capsule 2  . cephALEXin (KEFLEX) 500 MG capsule Take 1 capsule (500 mg total) by mouth 4 (four) times daily. 20 capsule 0  . Cholecalciferol (VITAMIN D) 1000 UNITS capsule Take 1,000 Units by mouth daily.      . citalopram (CELEXA) 20 MG tablet Take 20 mg by mouth daily.     . furosemide (LASIX) 40 MG tablet  Take 1 tablet (40 mg total) by mouth 2 (two) times daily. (Patient taking differently: Take 40 mg by mouth daily. ) 180 tablet 2  . levothyroxine (SYNTHROID, LEVOTHROID) 88 MCG tablet Take 88 mcg by mouth daily.    Marland Kitchen losartan (COZAAR) 50 MG tablet Take 1 tablet (50 mg total) by mouth daily. 30 tablet 1  . metoprolol tartrate (LOPRESSOR) 25 MG tablet TAKE 1/2 TABLET TWICE DAILY 90 tablet 3  . morphine (MSIR) 15 MG tablet Take 15 mg by mouth every 4 (four) hours as needed. Reported on 12/19/2015    . NON FORMULARY daily. 2 liters oxygen    . OXYGEN Inhale 2 L into the lungs daily.    Marland Kitchen SPIRIVA HANDIHALER 18 MCG inhalation capsule PLACE 1 CAPSULE INTO INHALER AND INHALE ONCE DAILY AS DIRECTED 30 capsule 6  . temazepam (RESTORIL) 15 MG capsule TAKE 1 CAPSULE AT BEDTIME AS NEEDED FOR SLEEP 90 capsule 1  . XARELTO 20 MG TABS tablet TAKE 1 TABLET BY MOUTH DAILY WITH SUPPER 30 tablet 5   No current facility-administered medications for this visit.     Allergies: No Known Allergies  Social History: The patient  reports that she quit smoking about 14 years ago. Her smoking use included Cigarettes. She has a 40.00 pack-year smoking history. She has never used smokeless tobacco. She reports that she drinks alcohol. She reports that she does not use drugs.   Family History: The patient's family history includes Diabetes in her mother; Diabetes type II in her mother; Heart attack in her father; Heart disease in her brother, father, and sister; Hypertension in her mother.   Review of Systems: Please see the history of present illness.   Otherwise, the review of systems is positive for .   All other systems are reviewed and negative.   Physical Exam: VS:  BP 102/60 (BP Location: Left Arm, Patient Position: Sitting, Cuff Size: Normal)   Pulse (!) 128   Ht '5\' 2"'$  (1.575 m)   Wt 243 lb 6.4 oz (110.4 kg)   BMI 44.52 kg/m  .  BMI Body mass index is 44.52 kg/m.  Wt Readings from Last 3 Encounters:    05/12/16 243 lb 6.4 oz (110.4 kg)  03/18/16 252 lb 4.8 oz (114.4 kg)  10/30/15 250 lb 12.8 oz (113.8 kg)    General: Pleasant. Morbidly obese female. She is alert and in no acute distress. She is  Wearing oxygen HEENT: Normal.  Neck: Supple, no JVD, carotid bruits, or masses noted.  Cardiac: Irregular rate and rhythm. Heart tones are very distant. Legs are full but no significant  edema.  Respiratory:  Lungs are fairly clear to auscultation bilaterally. GI: Soft and nontender.  MS: No deformity or atrophy. Gait and ROM intact. Using a walker.  Skin: Warm and dry. Color is normal.  Neuro:  Strength and sensation are intact and no gross focal deficits noted.  Psych: Alert, appropriate and with normal affect.   LABORATORY DATA:   Lab Results  Component Value Date   WBC 5.3 12/19/2015   HGB 15.0 12/19/2015   HCT 45.0 12/19/2015   PLT 135 (L) 12/19/2015   GLUCOSE 115 (H) 12/19/2015   ALT 15 07/27/2014   AST 19 07/27/2014   NA 138 12/19/2015   K 4.5 12/19/2015   CL 106 12/19/2015   CREATININE 0.80 12/19/2015   BUN 16 12/19/2015   CO2 27 12/19/2015   TSH 0.58 09/27/2013   INR 1.00 01/22/2012   Labs reviewed from 12/10/15: cholesterol 136, triglycerides 82, HDL 38, LDL 82. CMET done 03/28/16 is normal.  BNP (last 3 results) No results for input(s): BNP in the last 8760 hours.  ProBNP (last 3 results) No results for input(s): PROBNP in the last 8760 hours.   Other Studies Reviewed Today:  Ecg today shows Afib with rate 128. LAD, nonspecific ST abnormality. I have personally reviewed and interpreted this study.   CXR IMPRESSION: Severe diffuse interstitial change, which represents a change from 11/29/2014. Differential diagnostic possibilities include atypical pneumonia. Pulmonary edema appears to be on likely in the absence of vascular congestion or pleural effusion. Lymphangitic spread of tumor is another possibility that is considered less likely.   Electronically  Signed  By: Skipper Cliche M.D.  On: 04/12/2015 17:39  Echo Study Conclusions from 03/2015  - Left ventricle: The cavity size was normal. Wall thickness was  increased in a pattern of mild LVH. Systolic function was normal.  The estimated ejection fraction was in the range of 55% to 60%.  Wall motion was normal; there were no regional wall motion  abnormalities. Doppler parameters are consistent with  pseudonormal left ventricular relaxation (grade 2 diastolic  dysfunction). The E/e&' ratio is >15, suggesting elevated LV  filling pressure. - Mitral valve: Calcified annulus. - Left atrium: The atrium was normal in size. - Tricuspid valve: There was mild regurgitation. - Pulmonary arteries: PA peak pressure: 32 mm Hg (S). - Inferior vena cava: The vessel was dilated. The respirophasic  diameter changes were blunted (< 50%), consistent with elevated  central venous pressure.  Impressions:  - Compared to the prior study in 01/2012, there are few changes. The  RVSP is lower at 32 mmHg, down from 55 mmHg.   CT IMPRESSION: 1. There are findings in the lungs which could indicate interstitial lung disease, such is mild nonspecific interstitial pneumonia (NSIP). 2. Mild diffuse bronchial wall thickening with moderate centrilobular and mild paraseptal emphysema, as well as mild air trapping ; imaging findings suggestive of underlying COPD. 3. Atherosclerosis, including left main and 3 vessel coronary artery disease. Assessment for potential risk factor modification, dietary therapy or pharmacologic therapy may be warranted, if clinically indicated. 4. Mild cardiomegaly.   Electronically Signed  By: Vinnie Langton M.D.  On: 09/04/2015 12:11   Assessment/Plan: 1. Chronic dyspnea - mostly related to severe COPD with chronic respiratory failure and hypoxia, prior lung cancer and pulmonary HTN - on oxygen continually now. She does have some diastolic dysfunction  noted on Echo.  Her echo was updated last fall and EF is ok. She is not going to be able to  lose weight.   2. PAF - she is in Afib today but states she only goes our of rhythm about every 6 months. Will take an extra metoprolol as needed when in Afib.  Remains on her Xarelto.  3. Obesity - quite limited in her ability to exercise due to chronic back pain, dyspnea, etc. I do not see this changing.   4. Thoracic aneurysm - followed by Dr. Donnetta Hutching. No indication for intervention at this time..She is a poor surgical candidate.  Current medicines are reviewed with the patient today.  The patient does not have concerns regarding medicines other than what has been noted above.  The following changes have been made:  See above.  Labs/ tests ordered today include:    Orders Placed This Encounter  Procedures  . EKG 12-Lead     Disposition:   FU with Dr. Martinique in 6 months   Patient is agreeable to this plan and will call if any problems develop in the interim.   Signed: Kortne All Martinique MD, [facility]   05/12/2016 10:12 AM  [facility]

## 2016-05-12 ENCOUNTER — Ambulatory Visit (INDEPENDENT_AMBULATORY_CARE_PROVIDER_SITE_OTHER): Payer: Medicare Other | Admitting: Cardiology

## 2016-05-12 VITALS — BP 102/60 | HR 128 | Ht 62.0 in | Wt 243.4 lb

## 2016-05-12 DIAGNOSIS — I714 Abdominal aortic aneurysm, without rupture, unspecified: Secondary | ICD-10-CM

## 2016-05-12 DIAGNOSIS — I480 Paroxysmal atrial fibrillation: Secondary | ICD-10-CM | POA: Diagnosis not present

## 2016-05-12 DIAGNOSIS — I10 Essential (primary) hypertension: Secondary | ICD-10-CM | POA: Diagnosis not present

## 2016-05-12 DIAGNOSIS — E7800 Pure hypercholesterolemia, unspecified: Secondary | ICD-10-CM

## 2016-05-12 DIAGNOSIS — J9611 Chronic respiratory failure with hypoxia: Secondary | ICD-10-CM

## 2016-05-12 NOTE — Patient Instructions (Signed)
When you have atrial fibrillation you may take an extra metoprolol  Continue your other therapy  I will see you in 6 months

## 2016-05-21 NOTE — Addendum Note (Signed)
Addended by: Lianne Cure A on: 05/21/2016 02:35 PM   Modules accepted: Orders

## 2016-06-05 ENCOUNTER — Other Ambulatory Visit: Payer: Self-pay | Admitting: Pharmacist

## 2016-06-05 ENCOUNTER — Other Ambulatory Visit: Payer: Self-pay

## 2016-06-05 MED ORDER — RIVAROXABAN 20 MG PO TABS: 20.0000 mg | ORAL_TABLET | Freq: Every day | ORAL | 5 refills | Status: DC

## 2016-06-09 DIAGNOSIS — F329 Major depressive disorder, single episode, unspecified: Secondary | ICD-10-CM | POA: Diagnosis not present

## 2016-06-09 DIAGNOSIS — C3490 Malignant neoplasm of unspecified part of unspecified bronchus or lung: Secondary | ICD-10-CM | POA: Diagnosis not present

## 2016-06-09 DIAGNOSIS — I10 Essential (primary) hypertension: Secondary | ICD-10-CM | POA: Diagnosis not present

## 2016-06-09 DIAGNOSIS — E038 Other specified hypothyroidism: Secondary | ICD-10-CM | POA: Diagnosis not present

## 2016-06-09 DIAGNOSIS — D508 Other iron deficiency anemias: Secondary | ICD-10-CM | POA: Diagnosis not present

## 2016-06-09 DIAGNOSIS — Z6841 Body Mass Index (BMI) 40.0 and over, adult: Secondary | ICD-10-CM | POA: Diagnosis not present

## 2016-06-09 DIAGNOSIS — D279 Benign neoplasm of unspecified ovary: Secondary | ICD-10-CM | POA: Diagnosis not present

## 2016-06-09 DIAGNOSIS — M1990 Unspecified osteoarthritis, unspecified site: Secondary | ICD-10-CM | POA: Diagnosis not present

## 2016-06-09 DIAGNOSIS — E784 Other hyperlipidemia: Secondary | ICD-10-CM | POA: Diagnosis not present

## 2016-06-09 DIAGNOSIS — J449 Chronic obstructive pulmonary disease, unspecified: Secondary | ICD-10-CM | POA: Diagnosis not present

## 2016-06-09 DIAGNOSIS — I7389 Other specified peripheral vascular diseases: Secondary | ICD-10-CM | POA: Diagnosis not present

## 2016-06-09 DIAGNOSIS — I872 Venous insufficiency (chronic) (peripheral): Secondary | ICD-10-CM | POA: Diagnosis not present

## 2016-07-01 ENCOUNTER — Ambulatory Visit: Payer: Medicare Other | Admitting: Internal Medicine

## 2016-07-10 ENCOUNTER — Ambulatory Visit: Payer: Medicare Other | Admitting: Internal Medicine

## 2016-07-14 ENCOUNTER — Ambulatory Visit (INDEPENDENT_AMBULATORY_CARE_PROVIDER_SITE_OTHER): Payer: Medicare Other | Admitting: Adult Health

## 2016-07-14 ENCOUNTER — Encounter: Payer: Self-pay | Admitting: Adult Health

## 2016-07-14 VITALS — BP 124/72 | HR 71 | Ht 62.0 in | Wt 253.6 lb

## 2016-07-14 DIAGNOSIS — J9611 Chronic respiratory failure with hypoxia: Secondary | ICD-10-CM | POA: Diagnosis not present

## 2016-07-14 DIAGNOSIS — J439 Emphysema, unspecified: Secondary | ICD-10-CM

## 2016-07-14 NOTE — Progress Notes (Signed)
$'@Patient'e$  ID: [name], female    DOB: 17-May-1941, 76 y.o.   MRN: [id number]  Chief Complaint  Patient presents with  . Follow-up    COPD     Referring provider: Burnard Bunting, MD  HPI: 76 yo female former smoker followed for COPD and hx of Lung cancer 2004 (XRT/Chemo)   TEST  HRCT Chest 08/2015 with Mod Emphysema , few scattered areas of GG attenuation , mild subpleural reticulation. ?mild NSIP.   07/21/2016 Follow up : COPD  Pt returns for 6 month follow up . Says overall she is doing okay .  She remains on Spiriva daily . No flare of cough /or wheezing . She gets winded with walking . Marland Kitchen  We Discussed pulm rehab , she will consider. Is going to stay with daughter for few weeks and will call when she gets back .  Remains on o2 2l/m    No Known Allergies  Immunization History  Administered Date(s) Administered  . Influenza Split 03/23/2013, 03/22/2014  . Influenza Whole 04/07/2011, 03/23/2012  . Influenza, High Dose Seasonal PF 04/23/2016  . Influenza,inj,Quad PF,36+ Mos 04/24/2015  . Pneumococcal Conjugate-13 05/16/2013    Past Medical History:  Diagnosis Date  . AAA (abdominal aortic aneurysm) ([place])   . Anticoagulant long-term use    Failed on Coumadin. On Xarelto  . Anxiety   . Anxiety and depression   . Atrial fib/flutter, transient June 2012  . Chronic lower back pain   . COPD (chronic obstructive pulmonary disease) ([place])   . DVT (deep venous thrombosis) ([place]) 2004   BLE  . Esophageal dysmotility   . Exertional dyspnea   . Hiatal hernia   . History of bronchitis   . History of fibrocystic disease of breast   . History of uterine fibroid   . HTN (hypertension)   . Hyperlipidemia   . Hypothyroidism   . Normal nuclear stress test 2012   May 2012  . OA (osteoarthritis)    "knees; left shoulder"  . OSA (obstructive sleep apnea)    "haven't been using my CPAP lately" (01/21/12)  . Ovarian mass    right benign  . Ovarian mass    benign, right  .  PAD (peripheral artery disease) ([place])   . Small cell carcinoma of lung ([place]) 2004   NON-SMALL CELL CARCINOMA OF THE LUNG, METASTATIC TO THE SUPRACLAVICULAR AND MEDIASTINAL LYMPH NODES; in remission    Tobacco History: History  Smoking Status  . Former Smoker  . Packs/day: 1.00  . Years: 40.00  . Types: Cigarettes  . Quit date: 06/23/2001  Smokeless Tobacco  . Never Used   Counseling given: Not Answered   Outpatient Encounter Prescriptions as of 07/14/2016  Medication Sig  . atorvastatin (LIPITOR) 10 MG tablet Take 1 tablet by mouth daily.  Marland Kitchen CALCIUM-MAGNESIUM-ZINC PO Take 1 tablet by mouth daily.   Marland Kitchen CARTIA XT 120 MG 24 hr capsule TAKE 1 CAPSULE BY MOUTH DAILY.  Marland Kitchen Cholecalciferol (VITAMIN D) 1000 UNITS capsule Take 1,000 Units by mouth daily.    . citalopram (CELEXA) 20 MG tablet Take 20 mg by mouth daily.   . furosemide (LASIX) 40 MG tablet Take 1 tablet (40 mg total) by mouth 2 (two) times daily. (Patient taking differently: Take 40 mg by mouth daily. )  . levothyroxine (SYNTHROID, LEVOTHROID) 88 MCG tablet Take 88 mcg by mouth daily.  Marland Kitchen losartan (COZAAR) 50 MG tablet Take 1 tablet (50 mg total) by mouth daily.  . metoprolol  tartrate (LOPRESSOR) 25 MG tablet TAKE 1/2 TABLET TWICE DAILY  . morphine (MSIR) 15 MG tablet Take 15 mg by mouth every 4 (four) hours as needed. Reported on 12/19/2015  . NON FORMULARY daily. 2 liters oxygen  . OXYGEN Inhale 2 L into the lungs daily.  . rivaroxaban (XARELTO) 20 MG TABS tablet Take 1 tablet (20 mg total) by mouth daily with supper.  Marland Kitchen SPIRIVA HANDIHALER 18 MCG inhalation capsule PLACE 1 CAPSULE INTO INHALER AND INHALE ONCE DAILY AS DIRECTED  . [DISCONTINUED] albuterol (PROVENTIL HFA;VENTOLIN HFA) 108 (90 Base) MCG/ACT inhaler Inhale 2 puffs into the lungs every 6 (six) hours as needed for wheezing or shortness of breath.  . [DISCONTINUED] temazepam (RESTORIL) 15 MG capsule TAKE 1 CAPSULE AT BEDTIME AS NEEDED FOR SLEEP  . [DISCONTINUED]  cephALEXin (KEFLEX) 500 MG capsule Take 1 capsule (500 mg total) by mouth 4 (four) times daily. (Patient not taking: Reported on 07/14/2016)   No facility-administered encounter medications on file as of 07/14/2016.      Review of Systems  Constitutional:   No  weight loss, night sweats,  Fevers, chills,  +fatigue, or  lassitude.  HEENT:   No headaches,  Difficulty swallowing,  Tooth/dental problems, or  Sore throat,                No sneezing, itching, ear ache, nasal congestion, post nasal drip,   CV:  No chest pain,  Orthopnea, PND, swelling in lower extremities, anasarca, dizziness, palpitations, syncope.   GI  No heartburn, indigestion, abdominal pain, nausea, vomiting, diarrhea, change in bowel habits, loss of appetite, bloody stools.   Resp:    No chest wall deformity  Skin: no rash or lesions.  GU: no dysuria, change in color of urine, no urgency or frequency.  No flank pain, no hematuria   MS:  No joint pain or swelling.  No decreased range of motion.  No back pain.    Physical Exam  BP 124/72 (BP Location: Left Arm, Cuff Size: Normal)   Pulse 71   Ht '5\' 2"'$  (1.575 m)   Wt 253 lb 9.6 oz (115 kg)   SpO2 91%   BMI 46.38 kg/m   GEN: A/Ox3; pleasant , NAD, elderly    HEENT:  [place]/AT,  EACs-clear, TMs-wnl, NOSE-clear, THROAT-clear, no lesions, no postnasal drip or exudate noted.   NECK:  Supple w/ fair ROM; no JVD; normal carotid impulses w/o bruits; no thyromegaly or nodules palpated; no lymphadenopathy.    RESP  Decreased BS in bases . no accessory muscle use, no dullness to percussion  CARD:  RRR, no m/r/g, no peripheral edema, pulses intact, no cyanosis or clubbing.  GI:   Soft & nt; nml bowel sounds; no organomegaly or masses detected.   Musco: Warm bil, no deformities or joint swelling noted.   Neuro: alert, no focal deficits noted.    Skin: Warm, no lesions or rashes  Psych:  No change in mood or affect. No depression or anxiety.  No memory loss.  Lab  Results:   Imaging: No results found.   Assessment & Plan:   COPD with emphysema Compensated on Spriiva  Discussed pulm rehab  Check PFT on return   Plan  Patient Instructions  Continue on Spiriva daily  Continue on Oxygen .  Call back if you would like to go to Pulmonary rehab.  Good luck with your trip.  Follow up with Dr. Annamaria Boots  In 4 months with PFT .  Chronic respiratory failure with hypoxia Cont on o2      [employer], NP 07/21/2016

## 2016-07-14 NOTE — Patient Instructions (Addendum)
Continue on Spiriva daily  Continue on Oxygen .  Call back if you would like to go to Pulmonary rehab.  Good luck with your trip.  Follow up with Dr. Annamaria Boots  In 4 months with PFT .

## 2016-07-15 ENCOUNTER — Other Ambulatory Visit: Payer: Self-pay | Admitting: Internal Medicine

## 2016-07-15 NOTE — Telephone Encounter (Signed)
Ok to refill 6 months 

## 2016-07-15 NOTE — Telephone Encounter (Signed)
CY Please advise on refill. Thanks.  

## 2016-07-18 ENCOUNTER — Other Ambulatory Visit: Payer: Self-pay | Admitting: Internal Medicine

## 2016-07-21 NOTE — Assessment & Plan Note (Signed)
Cont on o2 .  

## 2016-07-21 NOTE — Assessment & Plan Note (Signed)
Compensated on Spriiva  Discussed pulm rehab  Check PFT on return   Plan  Patient Instructions  Continue on Spiriva daily  Continue on Oxygen .  Call back if you would like to go to Pulmonary rehab.  Good luck with your trip.  Follow up with Dr. Annamaria Boots  In 4 months with PFT .

## 2016-07-24 ENCOUNTER — Other Ambulatory Visit: Payer: Self-pay

## 2016-07-24 DIAGNOSIS — J9611 Chronic respiratory failure with hypoxia: Secondary | ICD-10-CM

## 2016-07-24 DIAGNOSIS — J439 Emphysema, unspecified: Secondary | ICD-10-CM

## 2016-07-29 DIAGNOSIS — R0781 Pleurodynia: Secondary | ICD-10-CM | POA: Diagnosis not present

## 2016-07-29 DIAGNOSIS — Z6841 Body Mass Index (BMI) 40.0 and over, adult: Secondary | ICD-10-CM | POA: Diagnosis not present

## 2016-07-29 DIAGNOSIS — J449 Chronic obstructive pulmonary disease, unspecified: Secondary | ICD-10-CM | POA: Diagnosis not present

## 2016-07-29 DIAGNOSIS — R0602 Shortness of breath: Secondary | ICD-10-CM | POA: Diagnosis not present

## 2016-08-20 ENCOUNTER — Other Ambulatory Visit: Payer: Self-pay | Admitting: Internal Medicine

## 2016-08-20 DIAGNOSIS — S0502XA Injury of conjunctiva and corneal abrasion without foreign body, left eye, initial encounter: Secondary | ICD-10-CM | POA: Diagnosis not present

## 2016-08-20 DIAGNOSIS — J449 Chronic obstructive pulmonary disease, unspecified: Secondary | ICD-10-CM

## 2016-08-20 DIAGNOSIS — H1132 Conjunctival hemorrhage, left eye: Secondary | ICD-10-CM | POA: Diagnosis not present

## 2016-08-27 DIAGNOSIS — R531 Weakness: Secondary | ICD-10-CM | POA: Diagnosis not present

## 2016-08-27 DIAGNOSIS — D508 Other iron deficiency anemias: Secondary | ICD-10-CM | POA: Diagnosis not present

## 2016-08-27 DIAGNOSIS — Z6841 Body Mass Index (BMI) 40.0 and over, adult: Secondary | ICD-10-CM | POA: Diagnosis not present

## 2016-08-27 DIAGNOSIS — E038 Other specified hypothyroidism: Secondary | ICD-10-CM | POA: Diagnosis not present

## 2016-08-27 DIAGNOSIS — I10 Essential (primary) hypertension: Secondary | ICD-10-CM | POA: Diagnosis not present

## 2016-08-27 DIAGNOSIS — I509 Heart failure, unspecified: Secondary | ICD-10-CM | POA: Diagnosis not present

## 2016-08-27 DIAGNOSIS — J449 Chronic obstructive pulmonary disease, unspecified: Secondary | ICD-10-CM | POA: Diagnosis not present

## 2016-08-27 DIAGNOSIS — I480 Paroxysmal atrial fibrillation: Secondary | ICD-10-CM | POA: Diagnosis not present

## 2016-08-27 DIAGNOSIS — Z9981 Dependence on supplemental oxygen: Secondary | ICD-10-CM | POA: Diagnosis not present

## 2016-08-27 DIAGNOSIS — R0602 Shortness of breath: Secondary | ICD-10-CM | POA: Diagnosis not present

## 2016-08-29 ENCOUNTER — Inpatient Hospital Stay (HOSPITAL_COMMUNITY): Admission: RE | Admit: 2016-08-29 | Payer: Medicare Other | Source: Ambulatory Visit

## 2016-08-29 DIAGNOSIS — J449 Chronic obstructive pulmonary disease, unspecified: Secondary | ICD-10-CM | POA: Diagnosis not present

## 2016-08-29 DIAGNOSIS — Z6841 Body Mass Index (BMI) 40.0 and over, adult: Secondary | ICD-10-CM | POA: Diagnosis not present

## 2016-08-29 DIAGNOSIS — I509 Heart failure, unspecified: Secondary | ICD-10-CM | POA: Diagnosis not present

## 2016-08-29 DIAGNOSIS — Z9981 Dependence on supplemental oxygen: Secondary | ICD-10-CM | POA: Diagnosis not present

## 2016-08-29 DIAGNOSIS — I10 Essential (primary) hypertension: Secondary | ICD-10-CM | POA: Diagnosis not present

## 2016-08-29 DIAGNOSIS — I480 Paroxysmal atrial fibrillation: Secondary | ICD-10-CM | POA: Diagnosis not present

## 2016-08-29 DIAGNOSIS — R0602 Shortness of breath: Secondary | ICD-10-CM | POA: Diagnosis not present

## 2016-09-11 DIAGNOSIS — R05 Cough: Secondary | ICD-10-CM | POA: Diagnosis not present

## 2016-09-11 DIAGNOSIS — Z1389 Encounter for screening for other disorder: Secondary | ICD-10-CM | POA: Diagnosis not present

## 2016-09-15 ENCOUNTER — Inpatient Hospital Stay (HOSPITAL_COMMUNITY)
Admission: EM | Admit: 2016-09-15 | Discharge: 2016-09-28 | DRG: 292 | Disposition: A | Discharge status: Home / Self Care | Payer: Medicare Other | Attending: Cardiology | Admitting: Cardiology

## 2016-09-15 ENCOUNTER — Encounter (HOSPITAL_COMMUNITY): Payer: Self-pay

## 2016-09-15 ENCOUNTER — Emergency Department (HOSPITAL_COMMUNITY): Payer: Medicare Other

## 2016-09-15 DIAGNOSIS — Z87891 Personal history of nicotine dependence: Secondary | ICD-10-CM

## 2016-09-15 DIAGNOSIS — I712 Thoracic aortic aneurysm, without rupture: Secondary | ICD-10-CM | POA: Diagnosis not present

## 2016-09-15 DIAGNOSIS — Z9981 Dependence on supplemental oxygen: Secondary | ICD-10-CM

## 2016-09-15 DIAGNOSIS — I959 Hypotension, unspecified: Secondary | ICD-10-CM | POA: Diagnosis not present

## 2016-09-15 DIAGNOSIS — E039 Hypothyroidism, unspecified: Secondary | ICD-10-CM | POA: Diagnosis present

## 2016-09-15 DIAGNOSIS — I739 Peripheral vascular disease, unspecified: Secondary | ICD-10-CM | POA: Diagnosis present

## 2016-09-15 DIAGNOSIS — Z85118 Personal history of other malignant neoplasm of bronchus and lung: Secondary | ICD-10-CM

## 2016-09-15 DIAGNOSIS — I714 Abdominal aortic aneurysm, without rupture, unspecified: Secondary | ICD-10-CM | POA: Diagnosis present

## 2016-09-15 DIAGNOSIS — I4891 Unspecified atrial fibrillation: Secondary | ICD-10-CM | POA: Diagnosis not present

## 2016-09-15 DIAGNOSIS — I480 Paroxysmal atrial fibrillation: Secondary | ICD-10-CM | POA: Diagnosis present

## 2016-09-15 DIAGNOSIS — E785 Hyperlipidemia, unspecified: Secondary | ICD-10-CM | POA: Diagnosis not present

## 2016-09-15 DIAGNOSIS — I4581 Long QT syndrome: Secondary | ICD-10-CM | POA: Diagnosis not present

## 2016-09-15 DIAGNOSIS — I071 Rheumatic tricuspid insufficiency: Secondary | ICD-10-CM | POA: Diagnosis present

## 2016-09-15 DIAGNOSIS — I110 Hypertensive heart disease with heart failure: Principal | ICD-10-CM | POA: Diagnosis present

## 2016-09-15 DIAGNOSIS — G4733 Obstructive sleep apnea (adult) (pediatric): Secondary | ICD-10-CM | POA: Diagnosis present

## 2016-09-15 DIAGNOSIS — E876 Hypokalemia: Secondary | ICD-10-CM | POA: Diagnosis present

## 2016-09-15 DIAGNOSIS — I493 Ventricular premature depolarization: Secondary | ICD-10-CM | POA: Diagnosis present

## 2016-09-15 DIAGNOSIS — I4892 Unspecified atrial flutter: Secondary | ICD-10-CM | POA: Diagnosis present

## 2016-09-15 DIAGNOSIS — I481 Persistent atrial fibrillation: Secondary | ICD-10-CM | POA: Diagnosis present

## 2016-09-15 DIAGNOSIS — J449 Chronic obstructive pulmonary disease, unspecified: Secondary | ICD-10-CM | POA: Diagnosis not present

## 2016-09-15 DIAGNOSIS — Z86718 Personal history of other venous thrombosis and embolism: Secondary | ICD-10-CM

## 2016-09-15 DIAGNOSIS — Z9221 Personal history of antineoplastic chemotherapy: Secondary | ICD-10-CM

## 2016-09-15 DIAGNOSIS — Z833 Family history of diabetes mellitus: Secondary | ICD-10-CM

## 2016-09-15 DIAGNOSIS — I5042 Chronic combined systolic (congestive) and diastolic (congestive) heart failure: Secondary | ICD-10-CM | POA: Diagnosis present

## 2016-09-15 DIAGNOSIS — Z923 Personal history of irradiation: Secondary | ICD-10-CM

## 2016-09-15 DIAGNOSIS — Z7901 Long term (current) use of anticoagulants: Secondary | ICD-10-CM

## 2016-09-15 DIAGNOSIS — I5043 Acute on chronic combined systolic (congestive) and diastolic (congestive) heart failure: Secondary | ICD-10-CM | POA: Diagnosis present

## 2016-09-15 DIAGNOSIS — I509 Heart failure, unspecified: Secondary | ICD-10-CM | POA: Diagnosis not present

## 2016-09-15 DIAGNOSIS — F419 Anxiety disorder, unspecified: Secondary | ICD-10-CM | POA: Diagnosis present

## 2016-09-15 DIAGNOSIS — I5022 Chronic systolic (congestive) heart failure: Secondary | ICD-10-CM | POA: Diagnosis present

## 2016-09-15 DIAGNOSIS — I10 Essential (primary) hypertension: Secondary | ICD-10-CM | POA: Diagnosis present

## 2016-09-15 DIAGNOSIS — J9611 Chronic respiratory failure with hypoxia: Secondary | ICD-10-CM | POA: Diagnosis present

## 2016-09-15 DIAGNOSIS — Z6841 Body Mass Index (BMI) 40.0 and over, adult: Secondary | ICD-10-CM | POA: Diagnosis not present

## 2016-09-15 DIAGNOSIS — I272 Pulmonary hypertension, unspecified: Secondary | ICD-10-CM | POA: Diagnosis not present

## 2016-09-15 DIAGNOSIS — F329 Major depressive disorder, single episode, unspecified: Secondary | ICD-10-CM | POA: Diagnosis present

## 2016-09-15 DIAGNOSIS — I5033 Acute on chronic diastolic (congestive) heart failure: Secondary | ICD-10-CM

## 2016-09-15 DIAGNOSIS — Z79899 Other long term (current) drug therapy: Secondary | ICD-10-CM

## 2016-09-15 DIAGNOSIS — E782 Mixed hyperlipidemia: Secondary | ICD-10-CM | POA: Diagnosis not present

## 2016-09-15 DIAGNOSIS — Z8249 Family history of ischemic heart disease and other diseases of the circulatory system: Secondary | ICD-10-CM

## 2016-09-15 DIAGNOSIS — R0602 Shortness of breath: Secondary | ICD-10-CM | POA: Diagnosis not present

## 2016-09-15 LAB — CBC
HCT: 47.5 % — ABNORMAL HIGH (ref 36.0–46.0)
Hemoglobin: 15.3 g/dL — ABNORMAL HIGH (ref 12.0–15.0)
MCH: 35.5 pg — ABNORMAL HIGH (ref 26.0–34.0)
MCHC: 32.2 g/dL (ref 30.0–36.0)
MCV: 110.2 fL — ABNORMAL HIGH (ref 78.0–100.0)
Platelets: 126 10*3/uL — ABNORMAL LOW (ref 150–400)
RBC: 4.31 MIL/uL (ref 3.87–5.11)
RDW: 13.5 % (ref 11.5–15.5)
WBC: 5.8 10*3/uL (ref 4.0–10.5)

## 2016-09-15 LAB — COMPREHENSIVE METABOLIC PANEL
ALT: 22 U/L (ref 14–54)
AST: 25 U/L (ref 15–41)
Albumin: 3.5 g/dL (ref 3.5–5.0)
Alkaline Phosphatase: 76 U/L (ref 38–126)
Anion gap: 9 (ref 5–15)
BUN: 11 mg/dL (ref 6–20)
CO2: 35 mmol/L — ABNORMAL HIGH (ref 22–32)
Calcium: 8.8 mg/dL — ABNORMAL LOW (ref 8.9–10.3)
Chloride: 95 mmol/L — ABNORMAL LOW (ref 101–111)
Creatinine, Ser: 0.93 mg/dL (ref 0.44–1.00)
GFR calc Af Amer: >60 mL/min (ref >60)
GFR calc non Af Amer: 58 mL/min — ABNORMAL LOW (ref >60)
Glucose, Bld: 93 mg/dL (ref 65–99)
Potassium: 3.5 mmol/L (ref 3.5–5.1)
Sodium: 139 mmol/L (ref 135–145)
Total Bilirubin: 1.1 mg/dL (ref 0.3–1.2)
Total Protein: 6.7 g/dL (ref 6.5–8.1)

## 2016-09-15 LAB — BASIC METABOLIC PANEL
Anion gap: 9 (ref 5–15)
BUN: 13 mg/dL (ref 6–20)
CO2: 33 mmol/L — ABNORMAL HIGH (ref 22–32)
Calcium: 9 mg/dL (ref 8.9–10.3)
Chloride: 97 mmol/L — ABNORMAL LOW (ref 101–111)
Creatinine, Ser: 0.95 mg/dL (ref 0.44–1.00)
GFR calc Af Amer: >60 mL/min (ref >60)
GFR calc non Af Amer: 56 mL/min — ABNORMAL LOW (ref >60)
Glucose, Bld: 81 mg/dL (ref 65–99)
Potassium: 4.3 mmol/L (ref 3.5–5.1)
Sodium: 139 mmol/L (ref 135–145)

## 2016-09-15 LAB — PROTIME-INR
INR: 1.71
Prothrombin Time: 20.3 seconds — ABNORMAL HIGH (ref 11.4–15.2)

## 2016-09-15 LAB — BRAIN NATRIURETIC PEPTIDE
B Natriuretic Peptide: 332.6 pg/mL — ABNORMAL HIGH (ref 0.0–100.0)
B Natriuretic Peptide: 348.5 pg/mL — ABNORMAL HIGH (ref 0.0–100.0)

## 2016-09-15 LAB — TSH: TSH: 2.813 u[IU]/mL (ref 0.350–4.500)

## 2016-09-15 LAB — MAGNESIUM: Magnesium: 2 mg/dL (ref 1.7–2.4)

## 2016-09-15 LAB — TROPONIN I: Troponin I: <0.03 ng/mL (ref <0.03)

## 2016-09-15 MED ORDER — FUROSEMIDE 10 MG/ML IJ SOLN
80.0000 mg | Freq: Two times a day (BID) | INTRAMUSCULAR | Status: DC
Start: 2016-09-15 — End: 2016-09-18
  Administered 2016-09-15 – 2016-09-17 (×5): 80 mg via INTRAVENOUS
  Filled 2016-09-15 (×6): qty 8

## 2016-09-15 MED ORDER — METOPROLOL TARTRATE 25 MG PO TABS
25.0000 mg | ORAL_TABLET | Freq: Two times a day (BID) | ORAL | Status: DC
  Administered 2016-09-15 – 2016-09-17 (×4): 25 mg via ORAL
  Filled 2016-09-15 (×5): qty 1

## 2016-09-15 MED ORDER — METOLAZONE 2.5 MG PO TABS
2.5000 mg | ORAL_TABLET | Freq: Every day | ORAL | Status: DC
  Administered 2016-09-15 – 2016-09-17 (×3): 2.5 mg via ORAL
  Filled 2016-09-15 (×3): qty 1

## 2016-09-15 MED ORDER — TIOTROPIUM BROMIDE MONOHYDRATE 18 MCG IN CAPS
18.0000 ug | ORAL_CAPSULE | Freq: Every day | RESPIRATORY_TRACT | Status: DC
  Administered 2016-09-16 – 2016-09-28 (×12): 18 ug via RESPIRATORY_TRACT
  Filled 2016-09-15 (×3): qty 5

## 2016-09-15 MED ORDER — SODIUM CHLORIDE 0.9% FLUSH: 3.0000 mL | INTRAVENOUS | Status: DC | PRN

## 2016-09-15 MED ORDER — SODIUM CHLORIDE 0.9% FLUSH
3.0000 mL | Freq: Two times a day (BID) | INTRAVENOUS | Status: DC
  Administered 2016-09-15 – 2016-09-28 (×24): 3 mL via INTRAVENOUS

## 2016-09-15 MED ORDER — FUROSEMIDE 10 MG/ML IJ SOLN
40.0000 mg | Freq: Once | INTRAMUSCULAR | Status: AC
  Administered 2016-09-15: 40 mg via INTRAVENOUS
  Filled 2016-09-15: qty 4

## 2016-09-15 MED ORDER — ALBUTEROL SULFATE (2.5 MG/3ML) 0.083% IN NEBU
2.5000 mg | INHALATION_SOLUTION | Freq: Four times a day (QID) | RESPIRATORY_TRACT | Status: DC | PRN
  Administered 2016-09-17: 2.5 mg via RESPIRATORY_TRACT

## 2016-09-15 MED ORDER — CALCIUM-MAGNESIUM-ZINC 333-133-5 MG PO TABS: ORAL_TABLET | Freq: Every day | ORAL | Status: DC

## 2016-09-15 MED ORDER — LEVOTHYROXINE SODIUM 88 MCG PO TABS
88.0000 ug | ORAL_TABLET | Freq: Every day | ORAL | Status: DC
  Administered 2016-09-16 – 2016-09-28 (×13): 88 ug via ORAL
  Filled 2016-09-15 (×14): qty 1

## 2016-09-15 MED ORDER — POTASSIUM CHLORIDE CRYS ER 20 MEQ PO TBCR
20.0000 meq | EXTENDED_RELEASE_TABLET | Freq: Every day | ORAL | Status: DC
  Administered 2016-09-15 – 2016-09-17 (×3): 20 meq via ORAL
  Filled 2016-09-15 (×3): qty 1

## 2016-09-15 MED ORDER — ACETAMINOPHEN 325 MG PO TABS
650.0000 mg | ORAL_TABLET | ORAL | Status: DC | PRN
  Administered 2016-09-18 – 2016-09-25 (×9): 650 mg via ORAL
  Filled 2016-09-15 (×8): qty 2

## 2016-09-15 MED ORDER — CITALOPRAM HYDROBROMIDE 20 MG PO TABS
20.0000 mg | ORAL_TABLET | Freq: Every day | ORAL | Status: DC
  Administered 2016-09-16 – 2016-09-28 (×13): 20 mg via ORAL
  Filled 2016-09-15 (×13): qty 1

## 2016-09-15 MED ORDER — TEMAZEPAM 15 MG PO CAPS
15.0000 mg | ORAL_CAPSULE | Freq: Every evening | ORAL | Status: DC | PRN
  Administered 2016-09-26 – 2016-09-27 (×3): 15 mg via ORAL
  Filled 2016-09-15 (×3): qty 1

## 2016-09-15 MED ORDER — RIVAROXABAN 20 MG PO TABS
20.0000 mg | ORAL_TABLET | Freq: Every day | ORAL | Status: DC
Start: 2016-09-16 — End: 2016-09-28
  Administered 2016-09-16 – 2016-09-27 (×12): 20 mg via ORAL
  Filled 2016-09-15 (×13): qty 1

## 2016-09-15 MED ORDER — ONDANSETRON HCL 4 MG/2ML IJ SOLN: 4.0000 mg | Freq: Four times a day (QID) | INTRAMUSCULAR | Status: DC | PRN

## 2016-09-15 MED ORDER — SODIUM CHLORIDE 0.9 % IV SOLN: 250.0000 mL | INTRAVENOUS | Status: DC | PRN

## 2016-09-15 MED ORDER — VITAMIN D 1000 UNITS PO TABS
1000.0000 [IU] | ORAL_TABLET | Freq: Every day | ORAL | Status: DC
  Administered 2016-09-16 – 2016-09-28 (×13): 1000 [IU] via ORAL
  Filled 2016-09-15 (×13): qty 1

## 2016-09-15 MED ORDER — VITAMIN B-12 100 MCG PO TABS
100.0000 ug | ORAL_TABLET | Freq: Every day | ORAL | Status: DC
  Administered 2016-09-15 – 2016-09-28 (×14): 100 ug via ORAL
  Filled 2016-09-15 (×14): qty 1

## 2016-09-15 NOTE — ED Provider Notes (Signed)
[facility] DEPT Provider Note   CSN: [id number] Arrival date & time: 09/15/16  1307     History   Chief Complaint Chief Complaint  Patient presents with  . Atrial Fibrillation    HPI [name] is a 76 y.o. female.  She complains of problems with atrial fibrillation, for 2 weeks.  It has caused her to have shortness of breath.  She is using her usual medications including diuretic, inhaler, and nebulizer, all without relief.  She has been monitoring her weight, since she has been seeing her doctor, over the last 2 weeks.  Her weight is staying stable.  She has had 3 visits with her PCP, without improvement of her symptoms.  Her medications have been modified to increase her diuretic, without relief.  She is not sure how much she is in atrial fibrillation.  She is anticoagulated.  She denies chest pain, fever, chills, cough, nausea or vomiting.  There are no other known modifying factors.  HPI  Past Medical History:  Diagnosis Date  . AAA (abdominal aortic aneurysm) ([place])   . Anticoagulant long-term use    Failed on Coumadin. On Xarelto  . Anxiety   . Anxiety and depression   . Atrial fib/flutter, transient June 2012  . Chronic lower back pain   . COPD (chronic obstructive pulmonary disease) ([place])   . DVT (deep venous thrombosis) ([place]) 2004   BLE  . Esophageal dysmotility   . Exertional dyspnea   . Hiatal hernia   . History of bronchitis   . History of fibrocystic disease of breast   . History of uterine fibroid   . HTN (hypertension)   . Hyperlipidemia   . Hypothyroidism   . Normal nuclear stress test 2012   May 2012  . OA (osteoarthritis)    "knees; left shoulder"  . OSA (obstructive sleep apnea)    "haven't been using my CPAP lately" (01/21/12)  . Ovarian mass    right benign  . Ovarian mass    benign, right  . PAD (peripheral artery disease) ([place])   . Small cell carcinoma of lung ([place]) 2004   NON-SMALL CELL CARCINOMA OF THE LUNG, METASTATIC TO THE  SUPRACLAVICULAR AND MEDIASTINAL LYMPH NODES; in remission    Patient Active Problem List   Diagnosis Date Noted  . Hyperlipidemia   . Chronic respiratory failure with hypoxia ([place]) 12/03/2014  . Insomnia, chronic 01/27/2014  . A-fib ([place])   . Multiple thyroid nodules 10/17/2012  . AAA (abdominal aortic aneurysm) ([place]) 05/11/2012  . Bradycardia 01/23/2012  . Hypothyroid 01/21/2012  . HTN (hypertension) 01/21/2012  . Edema 11/19/2011  . Atrial flutter ([place]) 11/20/2010  . Obstructive sleep apnea 10/16/2008  . DVT 08/19/2008  . COPD with emphysema ([place]) 08/19/2008  . NEOPLASM, MALIGNANT, LUNG, HX OF 08/19/2008  . Diastolic heart failure, NYHA class 2 ([place]) 08/03/2008    Past Surgical History:  Procedure Laterality Date  . ABDOMINAL AORTIC ANEURYSM REPAIR  ~ 2010   stent graft  . BLADDER SURGERY  ~ 2003   sling  . BREAST BIOPSY  1960's   both breast's - benign  . CARDIOVASCULAR STRESS TEST  2012   No ischemia  . CATARACT EXTRACTION W/ INTRAOCULAR LENS  IMPLANT, BILATERAL Bilateral ~ 2009  . REPLACEMENT TOTAL KNEE Left ~ 2008   left  . THYROIDECTOMY  ~ 1964    OB History    No data available       Home Medications    Prior to  Admission medications   Medication Sig Start Date End Date Taking? Authorizing Provider  atorvastatin (LIPITOR) 10 MG tablet Take 1 tablet by mouth daily. 02/23/13   Historical Provider, MD  CALCIUM-MAGNESIUM-ZINC PO Take 1 tablet by mouth daily.     Historical Provider, MD  CARTIA XT 120 MG 24 hr capsule TAKE 1 CAPSULE BY MOUTH DAILY. 03/05/16   Peter M Martinique, MD  Cholecalciferol (VITAMIN D) 1000 UNITS capsule Take 1,000 Units by mouth daily.      Historical Provider, MD  citalopram (CELEXA) 20 MG tablet Take 20 mg by mouth daily.     Historical Provider, MD  furosemide (LASIX) 40 MG tablet Take 1 tablet (40 mg total) by mouth 2 (two) times daily. Patient taking differently: Take 40 mg by mouth daily.  06/26/15   Peter M Martinique, MD  levothyroxine  (SYNTHROID, LEVOTHROID) 88 MCG tablet Take 88 mcg by mouth daily.    Historical Provider, MD  losartan (COZAAR) 50 MG tablet Take 1 tablet (50 mg total) by mouth daily. 05/21/12   Peter M Martinique, MD  metoprolol tartrate (LOPRESSOR) 25 MG tablet TAKE 1/2 TABLET TWICE DAILY 03/12/16   Peter M Martinique, MD  morphine (MSIR) 15 MG tablet Take 15 mg by mouth every 4 (four) hours as needed. Reported on 12/19/2015    Historical Provider, MD  NON FORMULARY daily. 2 liters oxygen    Historical Provider, MD  OXYGEN Inhale 2 L into the lungs daily.    Historical Provider, MD  rivaroxaban (XARELTO) 20 MG TABS tablet Take 1 tablet (20 mg total) by mouth daily with supper. 06/05/16   Peter M Martinique, MD  SPIRIVA HANDIHALER 18 MCG inhalation capsule PLACE 1 CAPSULE INTO INHALER AND INHALE ONCE DAILY AS DIRECTED 01/17/16   Deneise Lever, MD  temazepam (RESTORIL) 15 MG capsule TAKE 1 CAPSULE AT BEDTIME AS NEEDED  FOR  SLEEP 07/16/16   Deneise Lever, MD  VENTOLIN HFA 108 [phone] Base) MCG/ACT inhaler TAKE 2 PUFFS EVERY 6 HOURS AS NEEDED FORSHORTNESS OF BREATH/WHEEZING 07/18/16   Deneise Lever, MD    Family History Family History  Problem Relation Age of Onset  . Heart disease Father   . Heart attack Father   . Diabetes type II Mother   . Diabetes Mother   . Hypertension Mother   . Heart disease Brother     before age 70  . Heart disease Sister     See's Dr. Acie Fredrickson    Social History Social History  Substance Use Topics  . Smoking status: Former Smoker    Packs/day: 1.00    Years: 40.00    Types: Cigarettes    Quit date: 06/23/2001  . Smokeless tobacco: Never Used  . Alcohol use 0.0 oz/week     Comment: once a week (glass of wine)     Allergies   Patient has no known allergies.   Review of Systems Review of Systems  All other systems reviewed and are negative.    Physical Exam Updated Vital Signs BP 102/76   Pulse 69   Temp 97.5 F (36.4 C) (Oral)   Resp (!) 29   SpO2 93%   Physical  Exam  Constitutional: She is oriented to person, place, and time. She appears well-developed. She appears distressed (She is uncomfortable.).  HENT:  Head: Normocephalic and atraumatic.  Oral mucous membranes are dry  Eyes: Conjunctivae and EOM are normal. Pupils are equal, round, and reactive to light.  Neck: Normal range  of motion and phonation normal. Neck supple.  Cardiovascular: Normal rate and regular rhythm.   Pulmonary/Chest: Effort normal. She exhibits no tenderness.  Decreased air movement bilaterally.  No audible wheezes or rhonchi.  Abdominal: Soft. She exhibits no distension. There is no tenderness. There is no guarding.  Musculoskeletal: Normal range of motion.  Neurological: She is alert and oriented to person, place, and time. She exhibits normal muscle tone.  Skin: Skin is warm and dry.  Psychiatric: She has a normal mood and affect. Her behavior is normal. Judgment and thought content normal.  Nursing note and vitals reviewed.    ED Treatments / Results  Labs (all labs ordered are listed, but only abnormal results are displayed) Labs Reviewed  BASIC METABOLIC PANEL - Abnormal; Notable for the following:       Result Value   Chloride 97 (*)    CO2 33 (*)    GFR calc non Af Amer 56 (*)    All other components within normal limits  CBC - Abnormal; Notable for the following:    Hemoglobin 15.3 (*)    HCT 47.5 (*)    MCV 110.2 (*)    MCH 35.5 (*)    Platelets 126 (*)    All other components within normal limits  PROTIME-INR - Abnormal; Notable for the following:    Prothrombin Time 20.3 (*)    All other components within normal limits    EKG  EKG Interpretation  Date/Time:  Monday September 15 2016 13:18:40 EDT Ventricular Rate:  108 PR Interval:    QRS Duration: 94 QT Interval:  412 QTC Calculation: 552 R Axis:   -24 Text Interpretation:  Long QTc Atrial fibrillation with rapid ventricular response with premature ventricular or aberrantly conducted  complexes Low voltage QRS Cannot rule out Anterior infarct , age undetermined Prolonged QT Since last tracing QT has lengthened Abnormal ECG Confirmed by Eulis Foster  MD, Magdeline Prange [phone]) on 09/15/2016 4:00:33 PM       Radiology Dg Chest 2 View  Result Date: 09/15/2016 CLINICAL DATA:  Shortness breath.  Lung cancer. EXAM: CHEST  2 VIEW COMPARISON:  CT a chest 03/11/2016. FINDINGS: The heart is enlarged. Diffuse interstitial pattern is worse at the lung bases. There is no significant consolidation. Atherosclerotic changes are present at the aortic arch. Degenerative changes are present at both shoulders. IMPRESSION: 1. Cardiomegaly with a diffuse interstitial pattern suggesting edema and congestive heart failure. Infection is considered less likely. 2. Aortic atherosclerosis. Electronically Signed   By: San Morelle M.D.   On: 09/15/2016 14:14    Procedures Procedures (including critical care time)  Medications Ordered in ED Medications - No data to display   Initial Impression / Assessment and Plan / ED Course  I have reviewed the triage vital signs and the nursing notes.  Pertinent labs & imaging results that were available during my care of the patient were reviewed by me and considered in my medical decision making (see chart for details).     Medications  furosemide (LASIX) injection 40 mg (40 mg Intravenous Given 09/15/16 1634)    Patient Vitals for the past 24 hrs:  BP Temp Temp src Pulse Resp SpO2  09/15/16 1630 90/71 - - - (!) 25 -  09/15/16 1543 102/76 - - 69 (!) 29 93 %  09/15/16 1323 92/70 97.5 F (36.4 C) Oral (!) 103 (!) 24 91 %    5:06 PM Reevaluation with update and discussion. After initial assessment and treatment, an updated  evaluation reveals no change in clinical status.  Dr. Martinique from cardiology is in the room and states that he will admit the patient. Mose Colaizzi L    Final Clinical Impressions(s) / ED Diagnoses   Final diagnoses:  Atrial  fibrillation, unspecified type (HCC)   Shortness of breath with congestive heart failure and atrial fibrillation.  Rate is controlled.  She will require admission for stabilization.  Nursing Notes Reviewed/ Care Coordinated Applicable Imaging Reviewed Interpretation of Laboratory Data incorporated into ED treatment  Plan: Admit  New Prescriptions New Prescriptions   No medications on file     Daleen Bo, MD 09/15/16 1718

## 2016-09-15 NOTE — H&P (Signed)
Physician History and Physical    Patient ID: ZERIAH Ball MRN: [id number] DOB/AGE: [birth year] 76 y.o. Admit date: 09/15/2016  Primary Care Physician: Geoffery Lyons, MD Primary Cardiologist Verna Hamon Martinique MD  HPI: [name] is a 76 yo WF well known to me. She has a history of paroxysmal Afib, diastolic CHF, and COPD on home oxygen. She reports that over the past 3 weeks she has experienced increasing SOB with DOE and fatigue. Denies any chest pain. Seen in Dr. Jacquiline Doe office and noted to be in Afib. Metoprolol dose doubled and given IM lasix. Patient states she had a little relief but then it got worse again. Oral lasix doubled to 40 mg bid without improvement. Patient does not weigh regularly at home. Does note increasing LE edema and increased abdominal girth. Weight recorded in Dr. Jacquiline Doe office was 264 lbs. When I saw her in November weight was 243 lbs and when seen in January by pulmonary it was 253 lbs. Patient is very limited in activity due to chronic back arthritis. Notes increased orthopnea without PND. No recent cough, fever, wheezing.  She does have chronic dyspnea. Has had past normal nuclear stress test in June 2012. She had an echo in October 2016 that showed mild LVH with normal EF and grade 2 diastolic dysfunction. Does have moderate pulmonary HTN related to chronic COPD. Former smoker. Has had lung cancer treated with XRT and chemo. Other issues include OSA, past DVT, paroxysmal atrial flutter. She has had prior aorto bi-iliac stent grafting per Dr. Donnetta Hutching. Has a known thoracic aneurysm - last measured in March of 2017 and is 4.5 x 3.6 x 2.8 - continuing to be followed by Dr. Donnetta Hutching. Had her DVT while on coumadin. Currently on Xarelto. Has had issues with beta blocker and bradycardia in the past - and only able to tolerate low dose.    Review of systems complete and found to be negative unless listed above  Past Medical History:  Diagnosis Date  . AAA (abdominal aortic  aneurysm) ([place])   . Anticoagulant long-term use    Failed on Coumadin. On Xarelto  . Anxiety   . Anxiety and depression   . Atrial fib/flutter, transient June 2012  . Chronic lower back pain   . COPD (chronic obstructive pulmonary disease) ([place])   . DVT (deep venous thrombosis) ([place]) 2004   BLE  . Esophageal dysmotility   . Exertional dyspnea   . Hiatal hernia   . History of bronchitis   . History of fibrocystic disease of breast   . History of uterine fibroid   . HTN (hypertension)   . Hyperlipidemia   . Hypothyroidism   . Normal nuclear stress test 2012   May 2012  . OA (osteoarthritis)    "knees; left shoulder"  . OSA (obstructive sleep apnea)    "haven't been using my CPAP lately" (01/21/12)  . Ovarian mass    right benign  . Ovarian mass    benign, right  . PAD (peripheral artery disease) ([place])   . Small cell carcinoma of lung ([place]) 2004   NON-SMALL CELL CARCINOMA OF THE LUNG, METASTATIC TO THE SUPRACLAVICULAR AND MEDIASTINAL LYMPH NODES; in remission    Family History  Problem Relation Age of Onset  . Heart disease Father   . Heart attack Father   . Diabetes type II Mother   . Diabetes Mother   . Hypertension Mother   . Heart disease Brother     before age 31  .  Heart disease Sister     See's Dr. Acie Fredrickson    Social History   Social History  . Marital status: Divorced    Spouse name: N/A  . Number of children: 2  . Years of education: N/A   Occupational History  . retired     Chiropractor   Social History Main Topics  . Smoking status: Former Smoker    Packs/day: 1.00    Years: 40.00    Types: Cigarettes    Quit date: 06/23/2001  . Smokeless tobacco: Never Used  . Alcohol use 0.0 oz/week     Comment: once a week (glass of wine)  . Drug use: No  . Sexual activity: No   Other Topics Concern  . Not on file   Social History Narrative  . No narrative on file    Past Surgical History:  Procedure Laterality Date  . ABDOMINAL AORTIC ANEURYSM  REPAIR  ~ 2010   stent graft  . BLADDER SURGERY  ~ 2003   sling  . BREAST BIOPSY  1960's   both breast's - benign  . CARDIOVASCULAR STRESS TEST  2012   No ischemia  . CATARACT EXTRACTION W/ INTRAOCULAR LENS  IMPLANT, BILATERAL Bilateral ~ 2009  . REPLACEMENT TOTAL KNEE Left ~ 2008   left  . THYROIDECTOMY  ~ 1964      (Not in a hospital admission)  Physical Exam: Blood pressure 90/71, pulse 69, temperature 97.5 F (36.4 C), temperature source Oral, resp. rate (!) 25, SpO2 93 %. Current Weight  07/14/16 253 lb 9.6 oz (115 kg)  05/12/16 243 lb 6.4 oz (110.4 kg)  03/18/16 252 lb 4.8 oz (114.4 kg)    GENERAL:  Morbidly obese WF in mild respiratory distress. On oxygen. HEENT:  PERRL, EOMI, sclera are clear. Oropharynx is clear. NECK:  + jugular venous distention, carotid upstroke brisk and symmetric, no bruits, no thyromegaly or adenopathy LUNGS:  Bilateral rales. Diminished BS diffusely.  CHEST:  Unremarkable HEART:  IRRR,  PMI not displaced or sustained,S1 and S2 within normal limits, no S3, no S4: no clicks, no rubs, no murmurs ABD:  Soft, obese, nontender. Mildly distended. BS +, no masses or bruits. No hepatomegaly, no splenomegaly EXT:  2 + pulses throughout. 3+ LE edema with chronic hyperkeratosis and skin thickening. SKIN:  Warm and dry.  No rashes NEURO:  Alert and oriented x 3. Cranial nerves II through XII intact. PSYCH:  Cognitively intact    Labs:   Lab Results  Component Value Date   WBC 5.8 09/15/2016   HGB 15.3 (H) 09/15/2016   HCT 47.5 (H) 09/15/2016   MCV 110.2 (H) 09/15/2016   PLT 126 (L) 09/15/2016    Recent Labs Lab 09/15/16 1334  NA 139  K 4.3  CL 97*  CO2 33*  BUN 13  CREATININE 0.95  CALCIUM 9.0  GLUCOSE 81   Lab Results  Component Value Date   CKMB 2.6 01/22/2012   CKMB 3.0 01/22/2012   CKMB 3.1 01/21/2012   TROPONINI <0.30 01/22/2012   TROPONINI <0.30 01/22/2012   TROPONINI <0.30 01/21/2012   No results found for: CHOL No  results found for: HDL No results found for: LDLCALC No results found for: TRIG No results found for: CHOLHDL No results found for: LDLDIRECT  Lab Results  Component Value Date   PROBNP 311.0 (H) 04/12/2015   PROBNP 1,189.0 (H) 01/21/2012   Lab Results  Component Value Date   TSH 0.58 09/27/2013   No  results found for: HGBA1C  Radiology: Dg Chest 2 View  Result Date: 09/15/2016 CLINICAL DATA:  Shortness breath.  Lung cancer. EXAM: CHEST  2 VIEW COMPARISON:  CT a chest 03/11/2016. FINDINGS: The heart is enlarged. Diffuse interstitial pattern is worse at the lung bases. There is no significant consolidation. Atherosclerotic changes are present at the aortic arch. Degenerative changes are present at both shoulders. IMPRESSION: 1. Cardiomegaly with a diffuse interstitial pattern suggesting edema and congestive heart failure. Infection is considered less likely. 2. Aortic atherosclerosis. Electronically Signed   By: San Morelle M.D.   On: 09/15/2016 14:14    EKG: Atrial fibrillation with rate 108. occ. PVC. Low voltage with poor R wave progression. Prolonged QTc of 552 msec.   ASSESSMENT AND PLAN:  1. Acute on chronic diastolic CHF. Probably exacerbated by persistent Afib. Doubt ischemia in absence of chest pain. Will admit to telemetry. Continue metoprolol for rate control. Hold losartan and cardizem due to low BP. Initiate IV lasix for diuresis. Will also give metolazone initially po. Will check BNP, TSH, Echo. Sodium and fluid restriction. 2. Atrial fibrillation. Persistent. Rate currently under fair control. On Xarelto for anticoagulation with [place] Vasc score of 6. For now will continue rate control. She is a poor candidate for antiarrhythmic drug therapy. Amiodarone contraindicated with severe COPD on oxygen. Tikosyn and Sotalol contraindicated with prolonged QT. Multaq and flecainide are not good choices with CHF. Could consider DCCV to see if she will hold rhythm depending on  Echo findings but history suggests Afib will recur 3. COPD chronic severe. On home oxygen. Continue Spiriva and albuterol.  4. Morbid obesity with OSA 5. Prolonged QTc 6. Thoracic aortic aneurysm 7. AAA s/p repair.  8. HLD will resume lipitor 9. Hypothyroidism on replacement. 10. OSA 11. History of DVT on coumadin. Now on Xarelto.   Signed: Anaid Haney Martinique, [place]  09/15/2016, 5:13 PM

## 2016-09-15 NOTE — ED Triage Notes (Signed)
Pt here with c/o SOB and atrial fibrillation. He had a follow up appt today with Dr. Reynaldo Minium and was told to come here to the ED for further symptom management and treatment. Pt wears 3L [place] at home. Denies current chest pain.

## 2016-09-16 LAB — BASIC METABOLIC PANEL
Anion gap: 9 (ref 5–15)
BUN: 10 mg/dL (ref 6–20)
CO2: 37 mmol/L — ABNORMAL HIGH (ref 22–32)
Calcium: 9.2 mg/dL (ref 8.9–10.3)
Chloride: 93 mmol/L — ABNORMAL LOW (ref 101–111)
Creatinine, Ser: 0.95 mg/dL (ref 0.44–1.00)
GFR calc Af Amer: >60 mL/min (ref >60)
GFR calc non Af Amer: 56 mL/min — ABNORMAL LOW (ref >60)
Glucose, Bld: 111 mg/dL — ABNORMAL HIGH (ref 65–99)
Potassium: 3.8 mmol/L (ref 3.5–5.1)
Sodium: 139 mmol/L (ref 135–145)

## 2016-09-16 LAB — TROPONIN I
Troponin I: <0.03 ng/mL (ref <0.03)
Troponin I: <0.03 ng/mL (ref <0.03)

## 2016-09-16 MED ORDER — ATORVASTATIN CALCIUM 10 MG PO TABS
10.0000 mg | ORAL_TABLET | Freq: Every day | ORAL | Status: DC
  Administered 2016-09-16 – 2016-09-27 (×12): 10 mg via ORAL
  Filled 2016-09-16 (×12): qty 1

## 2016-09-16 NOTE — Care Management Note (Addendum)
Case Management Note  Patient Details  Name: [name] MRN: [id number] Date of Birth: December 08, 1940  Subjective/Objective:                 Spoke with patient at the bedside. She states that she lives at home alone. 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 She has a daughter that lives 6 miles away who calls daily and who for the past 3 weeks has been providing transport to [place]. Patient gets meds filled through mail order. Patient is not active with [facility] at this time. Patient states that she has a RW in her car, in her home, and a cane. She states she has oxygen through [facility] and a nebulizer. Counseled patient on daily weights, she states she understands the importance of starting to do this after DC.  Addendum- 3/30 Spoke with Tim with [facility]. They would be able to start [facility] PT Tues/ Wed of next week, patient states this is her provider of choice, anticipate DC over the weekend or early next pending progress and diuresis.  PCP Dr Sc[facility].   CM will place PT eval order as part of Heart Fx Consult   Action/Plan:   Expected Discharge Date:                  Expected Discharge Plan:     In-House Referral:     Discharge planning Services  CM Consult  Post Acute Care Choice:    Choice offered to:     DME Arranged:    DME Agency:     HH Arranged:    HH Agency:     Status of Service:  In process, will continue to follow  If discussed at Long Length of Stay Meetings, dates discussed:    Additional Comments:  Carles Collet, RN 09/16/2016, 1:22 PM

## 2016-09-16 NOTE — Progress Notes (Signed)
Two RN assessed, placed order for IV team consult.

## 2016-09-16 NOTE — Progress Notes (Addendum)
Progress Note  Patient Name: [name] Date of Encounter: 09/16/2016  Primary Cardiologist: Dr. Martinique   Subjective   Breathing is slightly better. Missed Xarelto last night, spoke with pharmacist. Will give right now.   Inpatient Medications    Scheduled Meds: . cholecalciferol  1,000 Units Oral Daily  . citalopram  20 mg Oral Daily  . furosemide  80 mg Intravenous Q12H  . levothyroxine  88 mcg Oral QAC breakfast  . metolazone  2.5 mg Oral Daily  . metoprolol tartrate  25 mg Oral BID  . potassium chloride  20 mEq Oral Daily  . rivaroxaban  20 mg Oral Q supper  . sodium chloride flush  3 mL Intravenous Q12H  . tiotropium  18 mcg Inhalation Daily  . vitamin B-12  100 mcg Oral Daily   Continuous Infusions:  PRN Meds: sodium chloride, acetaminophen, albuterol, ondansetron (ZOFRAN) IV, sodium chloride flush, temazepam   Vital Signs    Vitals:   09/16/16 0421 09/16/16 0813 09/16/16 0901 09/16/16 0918  BP: (!) 99/47 (!) 124/111 112/78   Pulse: (!) 103 63 97   Resp: '20 20 20   '$ Temp: 97.6 F (36.4 C) 97.4 F (36.3 C) 97.4 F (36.3 C)   TempSrc: Oral Oral Oral   SpO2: 93% (!) 89% 93% (!) 89%  Weight: 257 lb 11.2 oz (116.9 kg)     Height:        Intake/Output Summary (Last 24 hours) at 09/16/16 1053 Last data filed at 09/16/16 0903  Gross per 24 hour  Intake              342 ml  Output             1325 ml  Net             -983 ml   Filed Weights   09/15/16 2041 09/16/16 0421  Weight: 259 lb 12.8 oz (117.8 kg) 257 lb 11.2 oz (116.9 kg)    Telemetry    afib at rate of 110s - Personally Reviewed  ECG    Non today  Physical Exam   GEN: Morbidly obese WF. No respiratory distress. On oxygen.   Neck: + JVD Cardiac: Ir Ir tachycardiac , no murmurs, rubs, or gallops. 2+ BL LE edema.  Respiratory: Diminished breath sound with faint rales GI: Soft, nontender, non-distended  MS: No edema; No deformity. Neuro:  Nonfocal  Psych: Normal affect   Labs      Chemistry Recent Labs Lab 09/15/16 1334 09/15/16 2055 09/16/16 0224  NA 139 139 139  K 4.3 3.5 3.8  CL 97* 95* 93*  CO2 33* 35* 37*  GLUCOSE 81 93 111*  BUN '13 11 10  '$ CREATININE 0.95 0.93 0.95  CALCIUM 9.0 8.8* 9.2  PROT  --  6.7  --   ALBUMIN  --  3.5  --   AST  --  25  --   ALT  --  22  --   ALKPHOS  --  76  --   BILITOT  --  1.1  --   GFRNONAA 56* 58* 56*  GFRAA >60 >60 >60  ANIONGAP '9 9 9     '$ Hematology Recent Labs Lab 09/15/16 1334  WBC 5.8  RBC 4.31  HGB 15.3*  HCT 47.5*  MCV 110.2*  MCH 35.5*  MCHC 32.2  RDW 13.5  PLT 126*    Cardiac Enzymes Recent Labs Lab 09/15/16 2055 09/16/16 0224  TROPONINI <0.03 <0.03  No results for input(s): TROPIPOC in the last 168 hours.   BNP Recent Labs Lab 09/15/16 1334 09/15/16 2055  BNP 332.6* 348.5*     DDimer No results for input(s): DDIMER in the last 168 hours.   Radiology    Dg Chest 2 View  Result Date: 09/15/2016 CLINICAL DATA:  Shortness breath.  Lung cancer. EXAM: CHEST  2 VIEW COMPARISON:  CT a chest 03/11/2016. FINDINGS: The heart is enlarged. Diffuse interstitial pattern is worse at the lung bases. There is no significant consolidation. Atherosclerotic changes are present at the aortic arch. Degenerative changes are present at both shoulders. IMPRESSION: 1. Cardiomegaly with a diffuse interstitial pattern suggesting edema and congestive heart failure. Infection is considered less likely. 2. Aortic atherosclerosis. Electronically Signed   By: San Morelle M.D.   On: 09/15/2016 14:14    Cardiac Studies   Pending echo this admission   Patient Profile     76 y.o. female with hx of PAF, moderate pulmonary HTN  Due to chronic diastolic CHF, lung cancer treated with XRT and chemo, COPD on home oxygen, OSA, PVD with prior aorto bi-iliac stent grafting and tthoracic aneurysm - last measured in March of 2017 and is 4.5 x 3.6 x 2.8 - continuing to be followed by Dr. Donnetta Hutching,  DVT while on coumadin  (currently on Xarelto) presents with  3 weeks she has experienced increasing SOB with DOE and fatigue.  She has failed outpatient diuretics management by PCP.   Assessment & Plan    1. Acute on chronic diastolic CHF - She has gained 20 lb since 04/2016. Failed outpatient treatment. BNP 348. CXR showed edema and CHF. TSH normal. Pending echo this admission.  - started on IV lasix '80mg'$  BID and metolazone. Net I & O ~1L. Weight down 2lb. Continue current medication.  - Hold losartan and cardizem due to low BP. Now improved.   2. Afib with RVR - Rate elevated to 110s.  Poor candidate for antiarrhythmic therapy. Consider up titration of BB vs addition of Cardizem. BP stable. Continue Xarelto.   3. HTN - She was hypotensive on presentation. BP improved today.   Jarrett Soho, PA  09/16/2016, 10:53 AM    Patient seen and examined and history reviewed. Agree with above findings and plan. Patient is responding to diuretics well so far. Negative one liter since admission. Breathing is less labored. AFib rate has increased with holding cardizem. Will increase beta blocker for improved rate control. Losartan on hold for low BP. Echo pending.  Nafeesah Lapaglia [place], [place] 09/16/2016 11:20 AM

## 2016-09-16 NOTE — Evaluation (Signed)
Physical Therapy Evaluation Patient Details Name: [name] MRN: [id number] DOB: 10/08/1940 Today's Date: 09/16/2016   History of Present Illness  Pt is 76 yo with history of paroxysmal Afib, diastolic CHF, and COPD on home oxygen. She reports 3 week h/o increasing SOB with DOE and fatigue. Diagnosed with acute on chronic CHF.  Clinical Impression  Pt's oxygen saturation and HR difficult to fully assess on portable monitor. Used 3L supplemental oxygen during gait. She reports using only 2L at home as she does not know how to change setting on her home concentrator, uses 3L with portable unit that she takes in the car. Pt has marked decr in physical mobility and difficulty with gait distances compared to her normal.  Her dau is a school bus driver, friend who lives upstairs is available only at night although she does household chores and cooking. [place] Unclear as to pt's long term plans.  Will continue to follow patient while on this venue of care to progress mobility.  Therapy will continue to follow to assist with discharge planning and follow up recommendations.    Follow Up Recommendations Home health PT;Supervision - Intermittent    Equipment Recommendations  None recommended by PT    Recommendations for Other Services       Precautions / Restrictions Precautions Precautions: Fall Precaution Comments: uses walker at home Required Braces or Orthoses: Other Brace/Splint Restrictions Weight Bearing Restrictions: No      Mobility  Bed Mobility Overal bed mobility: Modified Independent             General bed mobility comments: supine to sit with raised HOB and rail  Transfers Overall transfer level: Needs assistance Equipment used: Rolling walker (2 wheeled) Transfers: Sit to/from [facility] Sit to Stand: Supervision Stand pivot transfers: Min guard       General transfer comment: sit to stand from bed,  stand pivot to/from [facility]  Ambulation/Gait Ambulation/Gait assistance: Supervision Ambulation Distance (Feet): 55 Feet Assistive device: Rolling walker (2 wheeled) Gait Pattern/deviations: Step-through pattern;Decreased step length - right;Decreased step length - left;Trunk flexed;Wide base of support     General Gait Details: intermittent standing rest breaks to manage fatigue and SOB  Stairs            Wheelchair Mobility    Modified Rankin (Stroke Patients Only)       Balance Overall balance assessment: Modified Independent                                           Pertinent Vitals/Pain Pain Assessment: No/denies pain    Home Living Family/patient expects to be discharged to:: Private residence Living Arrangements: Non-relatives/Friends Available Help at Discharge: Family;Friend(s);Available PRN/intermittently Type of Home: House Home Access: Level entry     Home Layout: Two level;Able to live on main level with bedroom/bathroom;Full bath on main level Home Equipment: Walker - 2 wheels;Walker - 4 wheels;Cane - single point;Bedside commode;Shower seat;Toilet riser Additional Comments: friend lives upstairs, is home in evenings. SHe assists with household chores including cooking meals to pt    Prior Function Level of Independence: Independent with assistive device(s)         Comments: uses RW and home O2.  Usually able to pick up nephew after school     Hand Dominance   Dominant Hand: Right  Extremity/Trunk Assessment   Upper Extremity Assessment Upper Extremity Assessment: Generalized weakness (left shoulder difficulty with reaching)    Lower Extremity Assessment Lower Extremity Assessment: Generalized weakness    Cervical / Trunk Assessment Cervical / Trunk Assessment: Kyphotic  Communication   Communication: No difficulties  Cognition Arousal/Alertness: Awake/alert Behavior During Therapy: WFL for tasks  assessed/performed Overall Cognitive Status: Within Functional Limits for tasks assessed                                        General Comments General comments (skin integrity, edema, etc.): oxygen saturation after gait 86-92% on 3L via nasal cannula, post exercise HR 99-124 bpm per portable monitor, DUE 3/4    Exercises Other Exercises Other Exercises: diaphragmatic breathing in standing   Assessment/Plan    PT Assessment Patient needs continued PT services  PT Problem List Decreased strength;Decreased activity tolerance;Decreased balance;Decreased mobility;Cardiopulmonary status limiting activity;Obesity       PT Treatment Interventions DME instruction;Gait training;Functional mobility training;Therapeutic activities;Therapeutic exercise;Balance training;Patient/family education    PT Goals (Current goals can be found in the Care Plan section)  Acute Rehab PT Goals Patient Stated Goal: regain strength PT Goal Formulation: With patient Time For Goal Achievement: 09/26/16 Potential to Achieve Goals: Good    Frequency Min 3X/week   Barriers to discharge Decreased caregiver support dau can assist short bouts during day, friend who lives upstairs available at night only.    Co-evaluation               End of Session Equipment Utilized During Treatment: Gait belt;Oxygen Activity Tolerance: Patient limited by fatigue;Treatment limited secondary to medical complications (Comment) (decr oxygen saturation during activity) Patient left: in bed;with call bell/phone within reach;with family/visitor present Nurse Communication: Mobility status PT Visit Diagnosis: Muscle weakness (generalized) (M62.81)    Time: 1455-1530 PT Time Calculation (min) (ACUTE ONLY): 35 min   Charges:   PT Evaluation $PT Eval Moderate Complexity: 1 Procedure PT Treatments $Gait Training: 8-22 mins   PT G CodesMalka So,  PT [phone]  [place] 09/16/2016, 4:03 PM

## 2016-09-16 NOTE — Progress Notes (Addendum)
New pt admission from ED. Pt brought to the floor in stable condition. Vitals taken. Initial Assessment done. Admission assessment has been completed. All immediate pertinent needs to patient addressed. Patient Guide given to patient. Important safety instructions relating to hospitalization reviewed with patient. Patient verbalized understanding. Will continue to monitor pt.

## 2016-09-17 ENCOUNTER — Inpatient Hospital Stay (HOSPITAL_COMMUNITY): Payer: Medicare Other

## 2016-09-17 DIAGNOSIS — I5043 Acute on chronic combined systolic (congestive) and diastolic (congestive) heart failure: Secondary | ICD-10-CM

## 2016-09-17 DIAGNOSIS — I509 Heart failure, unspecified: Secondary | ICD-10-CM

## 2016-09-17 LAB — PULMONARY FUNCTION TEST
DL/VA % pred: 45 %
DL/VA: 2.07 ml/min/mmHg/L
DLCO cor % pred: 24 %
DLCO cor: 5.28 ml/min/mmHg
DLCO unc % pred: 25 %
DLCO unc: 5.56 ml/min/mmHg
FEF 25-75 Post: 0.42 L/sec
FEF 25-75 Pre: 0.44 L/sec
FEF2575-%Change-Post: -3 %
FEF2575-%Pred-Post: 29 %
FEF2575-%Pred-Pre: 30 %
FEV1-%Change-Post: 0 %
FEV1-%Pred-Post: 43 %
FEV1-%Pred-Pre: 43 %
FEV1-Post: 0.8 L
FEV1-Pre: 0.8 L
FEV1FVC-%Change-Post: 6 %
FEV1FVC-%Pred-Pre: 91 %
FEV6-%Change-Post: -5 %
FEV6-%Pred-Post: 47 %
FEV6-%Pred-Pre: 49 %
FEV6-Post: 1.1 L
FEV6-Pre: 1.16 L
FEV6FVC-%Change-Post: 1 %
FEV6FVC-%Pred-Post: 105 %
FEV6FVC-%Pred-Pre: 103 %
FVC-%Change-Post: -6 %
FVC-%Pred-Post: 44 %
FVC-%Pred-Pre: 47 %
FVC-Post: 1.1 L
FVC-Pre: 1.18 L
Post FEV1/FVC ratio: 72 %
Post FEV6/FVC ratio: 100 %
Pre FEV1/FVC ratio: 68 %
Pre FEV6/FVC Ratio: 99 %
RV % pred: 82 %
RV: 1.85 L
TLC % pred: 67 %
TLC: 3.18 L

## 2016-09-17 LAB — BASIC METABOLIC PANEL
Anion gap: 12 (ref 5–15)
BUN: 17 mg/dL (ref 6–20)
CO2: 38 mmol/L — ABNORMAL HIGH (ref 22–32)
Calcium: 9.3 mg/dL (ref 8.9–10.3)
Chloride: 87 mmol/L — ABNORMAL LOW (ref 101–111)
Creatinine, Ser: 1.15 mg/dL — ABNORMAL HIGH (ref 0.44–1.00)
GFR calc Af Amer: 52 mL/min — ABNORMAL LOW (ref >60)
GFR calc non Af Amer: 45 mL/min — ABNORMAL LOW (ref >60)
Glucose, Bld: 117 mg/dL — ABNORMAL HIGH (ref 65–99)
Potassium: 3 mmol/L — ABNORMAL LOW (ref 3.5–5.1)
Sodium: 137 mmol/L (ref 135–145)

## 2016-09-17 LAB — ECHOCARDIOGRAM COMPLETE
Height: 62 in
Weight: 4011.2 oz

## 2016-09-17 MED ORDER — POTASSIUM CHLORIDE CRYS ER 20 MEQ PO TBCR
40.0000 meq | EXTENDED_RELEASE_TABLET | Freq: Once | ORAL | Status: AC
  Administered 2016-09-17: 40 meq via ORAL
  Filled 2016-09-17: qty 2

## 2016-09-17 MED ORDER — METOLAZONE 2.5 MG PO TABS: 2.5000 mg | ORAL_TABLET | Freq: Every day | ORAL | Status: DC

## 2016-09-17 MED ORDER — METOPROLOL TARTRATE 25 MG PO TABS
25.0000 mg | ORAL_TABLET | Freq: Once | ORAL | Status: AC
  Administered 2016-09-17: 25 mg via ORAL

## 2016-09-17 MED ORDER — AMIODARONE HCL 200 MG PO TABS
400.0000 mg | ORAL_TABLET | Freq: Two times a day (BID) | ORAL | Status: DC
  Administered 2016-09-17 (×2): 400 mg via ORAL
  Filled 2016-09-17 (×3): qty 2

## 2016-09-17 MED ORDER — METOPROLOL TARTRATE 25 MG PO TABS
25.0000 mg | ORAL_TABLET | Freq: Three times a day (TID) | ORAL | Status: DC
  Administered 2016-09-17 (×2): 25 mg via ORAL
  Filled 2016-09-17 (×3): qty 1

## 2016-09-17 NOTE — Progress Notes (Signed)
Paged Yolo MD grp re  Elevated HR. Waiting for call back. Pt asymptomatic

## 2016-09-17 NOTE — Progress Notes (Signed)
  Echocardiogram 2D Echocardiogram has been performed.  [name] M 09/17/2016, 9:44 AM

## 2016-09-17 NOTE — Progress Notes (Signed)
Additional lopressor 25 mg po given as per MD order. Continued to monitor pt.

## 2016-09-17 NOTE — Progress Notes (Signed)
Progress Note  Patient Name: [name] Date of Encounter: 09/17/2016  Primary Cardiologist: Dr. Martinique  Subjective   Breathing improved, but not at baseline. Denies any chest discomfort or palpitations.   Inpatient Medications    Scheduled Meds: . atorvastatin  10 mg Oral q1800  . cholecalciferol  1,000 Units Oral Daily  . citalopram  20 mg Oral Daily  . furosemide  80 mg Intravenous Q12H  . levothyroxine  88 mcg Oral QAC breakfast  . metolazone  2.5 mg Oral Daily  . metoprolol tartrate  25 mg Oral BID  . potassium chloride  20 mEq Oral Daily  . rivaroxaban  20 mg Oral Q supper  . sodium chloride flush  3 mL Intravenous Q12H  . tiotropium  18 mcg Inhalation Daily  . vitamin B-12  100 mcg Oral Daily   Continuous Infusions:  PRN Meds: sodium chloride, acetaminophen, albuterol, ondansetron (ZOFRAN) IV, sodium chloride flush, temazepam   Vital Signs    Vitals:   09/16/16 1900 09/16/16 2345 09/17/16 0317 09/17/16 0549  BP: 107/78 103/74  98/63  Pulse: 91 (!) 110 (!) 125 87  Resp:  18  17  Temp: 97.7 F (36.5 C) 97.9 F (36.6 C)  98.5 F (36.9 C)  TempSrc: Oral Oral  Oral  SpO2: 91% 90%  91%  Weight:    250 lb 11.2 oz (113.7 kg)  Height:        Intake/Output Summary (Last 24 hours) at 09/17/16 1007 Last data filed at 09/17/16 0904  Gross per 24 hour  Intake              780 ml  Output             3650 ml  Net            -2870 ml   Filed Weights   09/15/16 2041 09/16/16 0421 09/17/16 0549  Weight: 259 lb 12.8 oz (117.8 kg) 257 lb 11.2 oz (116.9 kg) 250 lb 11.2 oz (113.7 kg)    Telemetry    Atrial fibrillation, HR in 110's - 130's. Occasional PVC's.  - Personally Reviewed  ECG    No new tracings.   Physical Exam   General: Well developed, Caucasian female appearing in no acute distress. Head: Normocephalic, atraumatic.  Neck: Supple without bruits, JVD at 8cm. Lungs:  Resp regular and unlabored, mild rales at bases bilaterally. Heart:  Irregularly irregular, S1, S2, no S3, S4, or murmur; no rub. Abdomen: Soft, non-tender, non-distended with normoactive bowel sounds. No hepatomegaly. No rebound/guarding. No obvious abdominal masses. Extremities: No clubbing, cyanosis, 2+ pitting edema along lower extremities bilaterally. Distal pedal pulses are 2+ bilaterally. Neuro: Alert and oriented X 3. Moves all extremities spontaneously. Psych: Normal affect.  Labs    Chemistry Recent Labs Lab 09/15/16 2055 09/16/16 0224 09/17/16 0524  NA 139 139 137  K 3.5 3.8 3.0*  CL 95* 93* 87*  CO2 35* 37* 38*  GLUCOSE 93 111* 117*  BUN '11 10 17  '$ CREATININE 0.93 0.95 1.15*  CALCIUM 8.8* 9.2 9.3  PROT 6.7  --   --   ALBUMIN 3.5  --   --   AST 25  --   --   ALT 22  --   --   ALKPHOS 76  --   --   BILITOT 1.1  --   --   GFRNONAA 58* 56* 45*  GFRAA >60 >60 52*  ANIONGAP 9 9 12  Hematology Recent Labs Lab 09/15/16 1334  WBC 5.8  RBC 4.31  HGB 15.3*  HCT 47.5*  MCV 110.2*  MCH 35.5*  MCHC 32.2  RDW 13.5  PLT 126*    Cardiac Enzymes Recent Labs Lab 09/15/16 2055 09/16/16 0224 09/16/16 0953  TROPONINI <0.03 <0.03 <0.03   No results for input(s): TROPIPOC in the last 168 hours.   BNP Recent Labs Lab 09/15/16 1334 09/15/16 2055  BNP 332.6* 348.5*     DDimer No results for input(s): DDIMER in the last 168 hours.   Radiology    Dg Chest 2 View  Result Date: 09/15/2016 CLINICAL DATA:  Shortness breath.  Lung cancer. EXAM: CHEST  2 VIEW COMPARISON:  CT a chest 03/11/2016. FINDINGS: The heart is enlarged. Diffuse interstitial pattern is worse at the lung bases. There is no significant consolidation. Atherosclerotic changes are present at the aortic arch. Degenerative changes are present at both shoulders. IMPRESSION: 1. Cardiomegaly with a diffuse interstitial pattern suggesting edema and congestive heart failure. Infection is considered less likely. 2. Aortic atherosclerosis. Electronically Signed   By:  San Morelle M.D.   On: 09/15/2016 14:14    Cardiac Studies   Echocardiogram: Pending  Patient Profile     76 y.o. female w/ PMH of PAF (on Xarelto), moderate pulmonary HTN, chronic diastolic CHF, lung cancer (treated with XRT and chemo), COPD (on home oxygen), OSA, PVD (s/p aorto bi-iliac stent grafting and tthoracic aneurysm - last measured in March of 2017 and is 4.5 x 3.6 x 2.8 - continuing to be followed by Dr. Donnetta Hutching),  DVT (while on coumadin, currently on Xarelto) who presented to [facility] ED on 09/15/2016 for a 3 week history of progressive dyspnea.   Assessment & Plan    1. Acute on Chronic diastolic CHF - She has gained 20 lbs since 04/2016. Failed outpatient treatment with increased PO diuretics.  - BNP at 348 on admission with CXR showing edema and CHF. TSH normal.  - echo in 03/2015 showed a preserved EF of 55-60% with Grade 2 DD. Repeat Echo pending. - started on IV Lasix '80mg'$  BID and Metolazone with a net output of -3.8L thus far. Will ask that Metolazone be given 30 minutes prior to IV Lasix dosing. Continue with diuresis. Weight down 9 lbs (was at 243 lbs in 04/2016). - PTA Cardizem and Losartan held in the setting of low-BP's with diuresis.   2. Atrial Fibrillation with RVR - Rate elevated to 110's - 120's - remains on Lopressor '25mg'$  BID for rate control. Was on Lopressor 12.'5mg'$  BID and Cardizem CD '120mg'$  daily as an outpatient but Cardizem held in the setting of hypotension. Consider resuming short-acting Cardizem tomorrow if BP stabilizes.  - This patients CHA2DS2-VASc Score and unadjusted Ischemic Stroke Rate (% per year) is equal to 11.2 % stroke rate/year from a score of 7 (HTN, PVD, Female, Age (2), TE (2)). Continue Xarelto for anticoagulation.   3. HTN - BP improved to 98/63 - 128/83 in the past 24 hours.   4. Hypokalemia - K+ 3.0 this AM. Scheduled to have 20 mEq this AM. Will give additional 40 mEq now.   Weston Brass Erma Heritage , PA-C 10:07  AM 09/17/2016 Pager: [phone]  Echo: Study Conclusions  - Left ventricle: The cavity size was normal. Wall thickness was   increased in a pattern of moderate LVH. Systolic function was   moderately reduced. The estimated ejection fraction was in the   range of 35% to 40%.  Diffuse hypokinesis. The study is not   technically sufficient to allow evaluation of LV diastolic   function. - Aortic valve: Trileaflet. Sclerosis without stenosis. There was   no regurgitation. - Mitral valve: Calcified annulus. Mildly thickened leaflets . - Left atrium: The atrium was mildly dilated. - Right ventricle: The cavity size was normal. Mildly decreased RV   systolic function. - Right atrium: Moderately dilated. - Tricuspid valve: There was moderate regurgitation. - Pulmonary arteries: PA peak pressure: 49 mm Hg (S). - Inferior vena cava: The vessel was dilated. The respirophasic   diameter changes were blunted (< 50%), consistent with elevated   central venous pressure.  Impressions:  - Compared to a prior study in 2016, the LVEF is reduced now to   35-40%. A-fib wtih RVR is noted. The ascending aorta meausres 4.0   cm. There is mild AI, mild LAE, moderate RAE, moderate TR and   RVSP of 49 mmHg with a dilated IVC.  Patient seen and examined and history reviewed. Agree with above findings and plan. Patient is diuresing well. I/o negative 4 liters and weight down 9 lbs. Creatinine is starting to increase and potassium is low. Will DC metolazone. Continue IV lasix. Replete potassium Afib rate is still poorly controlled. Echo reviewed. EF has dropped to 35-40% ( previously 50%). I suspect this is tachycardia mediated. Rate control is difficult with low BP. Cardizem contraindicated in setting of low EF. Antiarrhythmic drug therapy limited. Multaq and flecainide contraindicated with CHF and low EF. Not a candidate for Tikosyn or Sotalol due to prolonged QT. Amiodarone is really only choice and will  have to be used cautiously in setting of COPD. Spirometry in 2016 showed moderate obstructive and restrictive disease. Will order full PFTs and diffusion capacity. Start amiodarone 400 mg bid. Increase metoprolol to 25 mg tid. May want to consider DCCV later this week. In long run would like to reduce amiodarone to lowest effective dose to minimize toxicity. She is not a good candidate for Afib ablation. AV node ablation would necessitate a pacemaker.   Altie Savard [place], [place] 09/17/2016 11:49 AM

## 2016-09-17 NOTE — Discharge Instructions (Addendum)
Low salt diet 2000 mg sodium per day at most. Weigh daily and call if weight climbs more than 3 lbs in a day or 5 lbs in a week.--to Dr. Doug Sou office.    Information on my medicine - XARELTO (Rivaroxaban)  This medication education was reviewed with me or my healthcare representative as part of my discharge preparation.  The pharmacist that spoke with me during my hospital stay was:  Arty Baumgartner, [facility]  Why was Xarelto prescribed for you? Xarelto was prescribed for you to reduce the risk of a blood clot forming that can cause a stroke if you have a medical condition called atrial fibrillation (a type of irregular heartbeat).  What do you need to know about xarelto ? Take your Xarelto ONCE DAILY at the same time every day with your evening meal. If you have difficulty swallowing the tablet whole, you may crush it and mix in applesauce just prior to taking your dose.  Take Xarelto exactly as prescribed by your doctor and DO NOT stop taking Xarelto without talking to the doctor who prescribed the medication.  Stopping without other stroke prevention medication to take the place of Xarelto may increase your risk of developing a clot that causes a stroke.  Refill your prescription before you run out.  After discharge, you should have regular check-up appointments with your healthcare provider that is prescribing your Xarelto.  In the future your dose may need to be changed if your kidney function or weight changes by a significant amount.  What do you do if you miss a dose? If you are taking Xarelto ONCE DAILY and you miss a dose, take it as soon as you remember on the same day then continue your regularly scheduled once daily regimen the next day. Do not take two doses of Xarelto at the same time or on the same day.   Important Safety Information A possible side effect of Xarelto is bleeding. You should call your healthcare provider right away if you experience any of the  following: ? Bleeding from an injury or your nose that does not stop. ? Unusual colored urine (red or dark brown) or unusual colored stools (red or black). ? Unusual bruising for unknown reasons. ? A serious fall or if you hit your head (even if there is no bleeding).  Some medicines may interact with Xarelto and might increase your risk of bleeding while on Xarelto. To help avoid this, consult your healthcare provider or pharmacist prior to using any new prescription or non-prescription medications, including herbals, vitamins, non-steroidal anti-inflammatory drugs (NSAIDs) and supplements.  This website has more information on Xarelto: [email or web address]

## 2016-09-18 DIAGNOSIS — J449 Chronic obstructive pulmonary disease, unspecified: Secondary | ICD-10-CM

## 2016-09-18 LAB — BASIC METABOLIC PANEL
Anion gap: 11 (ref 5–15)
Anion gap: 11 (ref 5–15)
BUN: 22 mg/dL — ABNORMAL HIGH (ref 6–20)
BUN: 22 mg/dL — ABNORMAL HIGH (ref 6–20)
CO2: 40 mmol/L — ABNORMAL HIGH (ref 22–32)
CO2: 38 mmol/L — ABNORMAL HIGH (ref 22–32)
Calcium: 9.3 mg/dL (ref 8.9–10.3)
Calcium: 9.2 mg/dL (ref 8.9–10.3)
Chloride: 85 mmol/L — ABNORMAL LOW (ref 101–111)
Chloride: 86 mmol/L — ABNORMAL LOW (ref 101–111)
Creatinine, Ser: 1.12 mg/dL — ABNORMAL HIGH (ref 0.44–1.00)
Creatinine, Ser: 1.19 mg/dL — ABNORMAL HIGH (ref 0.44–1.00)
GFR calc Af Amer: 53 mL/min — ABNORMAL LOW (ref >60)
GFR calc Af Amer: 50 mL/min — ABNORMAL LOW (ref >60)
GFR calc non Af Amer: 46 mL/min — ABNORMAL LOW (ref >60)
GFR calc non Af Amer: 43 mL/min — ABNORMAL LOW (ref >60)
Glucose, Bld: 123 mg/dL — ABNORMAL HIGH (ref 65–99)
Glucose, Bld: 117 mg/dL — ABNORMAL HIGH (ref 65–99)
Potassium: 2.4 mmol/L — CL (ref 3.5–5.1)
Potassium: 3.7 mmol/L (ref 3.5–5.1)
Sodium: 136 mmol/L (ref 135–145)
Sodium: 135 mmol/L (ref 135–145)

## 2016-09-18 LAB — MAGNESIUM: Magnesium: 1.7 mg/dL (ref 1.7–2.4)

## 2016-09-18 MED ORDER — METOPROLOL TARTRATE 25 MG PO TABS
25.0000 mg | ORAL_TABLET | Freq: Two times a day (BID) | ORAL | Status: DC
  Administered 2016-09-19: 25 mg via ORAL
  Filled 2016-09-18: qty 1

## 2016-09-18 MED ORDER — METOPROLOL TARTRATE 50 MG PO TABS
50.0000 mg | ORAL_TABLET | Freq: Three times a day (TID) | ORAL | Status: DC
  Filled 2016-09-18: qty 1

## 2016-09-18 MED ORDER — METOPROLOL TARTRATE 50 MG PO TABS
50.0000 mg | ORAL_TABLET | Freq: Once | ORAL | Status: AC
  Administered 2016-09-18: 50 mg via ORAL

## 2016-09-18 MED ORDER — POTASSIUM CHLORIDE CRYS ER 20 MEQ PO TBCR
40.0000 meq | EXTENDED_RELEASE_TABLET | Freq: Once | ORAL | Status: AC
  Administered 2016-09-18: 40 meq via ORAL
  Filled 2016-09-18: qty 2

## 2016-09-18 MED ORDER — FUROSEMIDE 8 MG/ML PO SOLN
40.0000 mg | Freq: Once | ORAL | Status: DC
  Filled 2016-09-18: qty 5

## 2016-09-18 MED ORDER — FUROSEMIDE 10 MG/ML IJ SOLN
40.0000 mg | Freq: Two times a day (BID) | INTRAMUSCULAR | Status: DC
  Administered 2016-09-18 – 2016-09-19 (×2): 40 mg via INTRAVENOUS
  Filled 2016-09-18 (×2): qty 4

## 2016-09-18 NOTE — Progress Notes (Signed)
qPhysical Therapy Treatment Patient Details Name: [name] MRN: [id number] DOB: 1941-02-24 Today's Date: 09/18/2016    History of Present Illness Pt is 76 yo with history of paroxysmal Afib, diastolic CHF, and COPD on home oxygen. She reports 3 week h/o increasing SOB with DOE and fatigue. Diagnosed with acute on chronic CHF.    PT Comments    Has made progress with general gait stability and stamina, though pt needed some practice with pursed lip breathing which allow her to maintain adequate oxygenation on 3L [place].   Follow Up Recommendations  Home health PT;Supervision - Intermittent     Equipment Recommendations  None recommended by PT    Recommendations for Other Services       Precautions / Restrictions Precautions Precautions: Fall Precaution Comments: uses walker at home Restrictions Weight Bearing Restrictions: No    Mobility  Bed Mobility               General bed mobility comments: sitting at EOB  Transfers Overall transfer level: Needs assistance Equipment used: Rolling walker (2 wheeled) Transfers: Sit to/from Stand Sit to Stand: Supervision         General transfer comment: cues for hand placement/safety  Ambulation/Gait Ambulation/Gait assistance: Supervision Ambulation Distance (Feet): 80 Feet (90 feet and addt 80 feet with rests and vital checks between) Assistive device: Rolling walker (2 wheeled) Gait Pattern/deviations: Step-through pattern Gait velocity: slower Gait velocity interpretation: Below normal speed for age/gender General Gait Details: generally steady and slower gait with noticeable dyspnea. see vitals under general comments   Stairs            Wheelchair Mobility    Modified Rankin (Stroke Patients Only)       Balance Overall balance assessment: No apparent balance deficits (not formally assessed)                                          Cognition Arousal/Alertness:  Awake/alert Behavior During Therapy: WFL for tasks assessed/performed Overall Cognitive Status: Within Functional Limits for tasks assessed                                        Exercises      General Comments General comments (skin integrity, edema, etc.): At rest on 2L sats 93% at HR 52 bpm;  During amb on 3L, intially sats dropped to 84% at upper 80's bpm; pt learned/practiced pursed lip breathing.  Next amb on 3L with eff breathing, sats 88-92% at EHR 92 bpm with quick recovey at rest.      Pertinent Vitals/Pain Pain Assessment: No/denies pain    Home Living                      Prior Function            PT Goals (current goals can now be found in the care plan section) Acute Rehab PT Goals Patient Stated Goal: regain strength PT Goal Formulation: With patient Time For Goal Achievement: 09/26/16 Potential to Achieve Goals: Good Progress towards PT goals: Progressing toward goals    Frequency    Min 3X/week      PT Plan Current plan remains appropriate    Co-evaluation  End of Session Equipment Utilized During Treatment: Gait belt;Oxygen Activity Tolerance: Patient tolerated treatment well;Patient limited by fatigue Patient left: in bed;with call bell/phone within reach;Other (comment) (sitting EOB) Nurse Communication: Mobility status PT Visit Diagnosis: Muscle weakness (generalized) (M62.81);Other abnormalities of gait and mobility (R26.89)     Time: 0388-8280 PT Time Calculation (min) (ACUTE ONLY): 27 min  Charges:  $Gait Training: 8-22 mins $Therapeutic Activity: 8-22 mins                    G Codes:       Oct 14, 2016  Donnella Sham, PT [phone]  (pager)   Tessie Fass [place] 10/14/16, 1:44 PM

## 2016-09-18 NOTE — Progress Notes (Signed)
Page to PA who just saw pt, and changed a couple of her medication orders. Requesting clarification on morning doses scheduled just prior to cancellation that were not yet administered.

## 2016-09-18 NOTE — Progress Notes (Signed)
     Lopressor increased from '25mg'$  BID to '50mg'$  BID but now BP 91/68. Will change back to '25mg'$  BID  Angelena Form PA-C  [facility]

## 2016-09-18 NOTE — Progress Notes (Signed)
Bath and oral care set up was offered and pt stated that she will do later

## 2016-09-18 NOTE — Progress Notes (Signed)
Progress Note  Patient Name: [name] Date of Encounter: 09/18/2016  Primary Cardiologist: Dr. Martinique  Subjective   Breathing improved, but not at baseline. Denies any chest discomfort or palpitations. Able to lay almost flat for sleep.   Inpatient Medications    Scheduled Meds: . amiodarone  400 mg Oral BID  . atorvastatin  10 mg Oral q1800  . cholecalciferol  1,000 Units Oral Daily  . citalopram  20 mg Oral Daily  . furosemide  80 mg Intravenous Q12H  . levothyroxine  88 mcg Oral QAC breakfast  . metoprolol tartrate  25 mg Oral TID  . potassium chloride  40 mEq Oral Once  . rivaroxaban  20 mg Oral Q supper  . sodium chloride flush  3 mL Intravenous Q12H  . tiotropium  18 mcg Inhalation Daily  . vitamin B-12  100 mcg Oral Daily   Continuous Infusions:  PRN Meds: sodium chloride, acetaminophen, albuterol, ondansetron (ZOFRAN) IV, sodium chloride flush, temazepam   Vital Signs    Vitals:   09/17/16 1215 09/17/16 2035 09/18/16 0404 09/18/16 0740  BP: 118/86 109/73 112/73   Pulse: 73 (!) 54 (!) 103   Resp: '18 18 20   '$ Temp: 98.1 F (36.7 C) 98.6 F (37 C) 98 F (36.7 C)   TempSrc: Oral Oral Oral   SpO2: 91% 90% 91% 90%  Weight:   246 lb 11.1 oz (111.9 kg)   Height:        Intake/Output Summary (Last 24 hours) at 09/18/16 1000 Last data filed at 09/18/16 0900  Gross per 24 hour  Intake              990 ml  Output             2700 ml  Net            -1710 ml   Filed Weights   09/16/16 0421 09/17/16 0549 09/18/16 0404  Weight: 257 lb 11.2 oz (116.9 kg) 250 lb 11.2 oz (113.7 kg) 246 lb 11.1 oz (111.9 kg)    Telemetry    Atrial fibrillation, HR in 100 - 110's. Occasional PVC's.  - Personally Reviewed  ECG    No new tracings.   Physical Exam   General: Well developed, Caucasian female appearing in no acute distress. Head: Normocephalic, atraumatic.  Neck: Supple without bruits, JVD at 8cm. Lungs:  Resp regular and unlabored, CTA Heart:  Irregularly irregular, S1, S2, no S3, S4, or murmur; no rub. Abdomen: Soft, non-tender, non-distended with normoactive bowel sounds. No hepatomegaly. No rebound/guarding. No obvious abdominal masses. Extremities: No clubbing, cyanosis, 2+ pitting edema along lower extremities bilaterally. Distal pedal pulses are 2+ bilaterally. Darkened, thickened skin from mid-calf down. Neuro: Alert and oriented X 3. Moves all extremities spontaneously. Psych: Normal affect.  Labs    Chemistry  Recent Labs Lab 09/15/16 2055 09/16/16 0224 09/17/16 0524 09/18/16 0446  NA 139 139 137 136  K 3.5 3.8 3.0* 2.4*  CL 95* 93* 87* 85*  CO2 35* 37* 38* 40*  GLUCOSE 93 111* 117* 123*  BUN '11 10 17 '$ 22*  CREATININE 0.93 0.95 1.15* 1.12*  CALCIUM 8.8* 9.2 9.3 9.3  PROT 6.7  --   --   --   ALBUMIN 3.5  --   --   --   AST 25  --   --   --   ALT 22  --   --   --   ALKPHOS 76  --   --   --  BILITOT 1.1  --   --   --   GFRNONAA 58* 56* 45* 46*  GFRAA >60 >60 52* 53*  ANIONGAP '9 9 12 11     '$ Hematology  Recent Labs Lab 09/15/16 1334  WBC 5.8  RBC 4.31  HGB 15.3*  HCT 47.5*  MCV 110.2*  MCH 35.5*  MCHC 32.2  RDW 13.5  PLT 126*    Cardiac Enzymes  Recent Labs Lab 09/15/16 2055 09/16/16 0224 09/16/16 0953  TROPONINI <0.03 <0.03 <0.03   No results for input(s): TROPIPOC in the last 168 hours.   BNP  Recent Labs Lab 09/15/16 1334 09/15/16 2055  BNP 332.6* 348.5*     DDimer No results for input(s): DDIMER in the last 168 hours.   Radiology    Dg Chest 2 View  Result Date: 09/15/2016 CLINICAL DATA:  Shortness breath.  Lung cancer. EXAM: CHEST  2 VIEW COMPARISON:  CT a chest 03/11/2016. FINDINGS: The heart is enlarged. Diffuse interstitial pattern is worse at the lung bases. There is no significant consolidation. Atherosclerotic changes are present at the aortic arch. Degenerative changes are present at both shoulders. IMPRESSION: 1. Cardiomegaly with a diffuse interstitial pattern  suggesting edema and congestive heart failure. Infection is considered less likely. 2. Aortic atherosclerosis. Electronically Signed   By: San Morelle M.D.   On: 09/15/2016 14:14    Cardiac Studies   Echocardiogram: 09/17/16 Echo: Study Conclusions  - Left ventricle: The cavity size was normal. Wall thickness was   increased in a pattern of moderate LVH. Systolic function was   moderately reduced. The estimated ejection fraction was in the   range of 35% to 40%. Diffuse hypokinesis. The study is not   technically sufficient to allow evaluation of LV diastolic   function. - Aortic valve: Trileaflet. Sclerosis without stenosis. There was   no regurgitation. - Mitral valve: Calcified annulus. Mildly thickened leaflets . - Left atrium: The atrium was mildly dilated. - Right ventricle: The cavity size was normal. Mildly decreased RV   systolic function. - Right atrium: Moderately dilated. - Tricuspid valve: There was moderate regurgitation. - Pulmonary arteries: PA peak pressure: 49 mm Hg (S). - Inferior vena cava: The vessel was dilated. The respirophasic   diameter changes were blunted (< 50%), consistent with elevated   central venous pressure.  Impressions:  - Compared to a prior study in 2016, the LVEF is reduced now to   35-40%. A-fib wtih RVR is noted. The ascending aorta meausres 4.0   cm. There is mild AI, mild LAE, moderate RAE, moderate TR and   RVSP of 49 mmHg with a dilated IVC.  Patient Profile     76 y.o. female w/ PMH of PAF (on Xarelto), moderate pulmonary HTN, chronic diastolic CHF, lung cancer (treated with XRT and chemo), COPD (on home oxygen), OSA, PVD (s/p aorto bi-iliac stent grafting and tthoracic aneurysm - last measured in March of 2017 and is 4.5 x 3.6 x 2.8 - continuing to be followed by Dr. Donnetta Hutching),  DVT (while on coumadin, currently on Xarelto) who presented to [facility] ED on 09/15/2016 for a 3 week history of progressive dyspnea.    Assessment & Plan    1. Acute on Chronic diastolic CHF - She gained 20 lbs since 04/2016. Failed outpatient treatment with increased PO diuretics.  - BNP at 348 on admission with CXR showing edema and CHF. TSH normal.  - echo in 03/2015 showed a preserved EF of 55-60% with Grade 2  DD. Repeat Echo shows EF 35-40%. Suspect this is tachycardia mediated. - started on IV Lasix '80mg'$  BID and Metolazone with a net output of -5.7L thus far. Weight down 13 lbs- 246 today (was at 243 lbs in 04/2016). Pt was hypokalemic yesterday, metolazone stopped. SCr stable at 1.12 today -Breathing is steadily improving.  - PTA Cardizem and Losartan held in the setting of low-BP's with diuresis. Cardizem is contraindicated in setting of low EF.   2. Atrial Fibrillation with RVR -Was on Lopressor 12.'5mg'$  BID and Cardizem CD '120mg'$  daily as an outpatient but Cardizem held in the setting of hypotension.  -Per Dr. Martinique: Antiarrhythmic drug therapy limited. Multaq and flecainide contraindicated with CHF and low EF.   -Rate slightly improved to 100-110's -She is not a good candidate for Afib ablation. AV node ablation would necessitate a pacemaker.  - This patients CHA2DS2-VASc Score and unadjusted Ischemic Stroke Rate (% per year) is equal to 11.2 % stroke rate/year from a score of 7 (HTN, PVD, Female, Age (2), TE (2)). Continue Xarelto for anticoagulation.   3. HTN - BP improved 109/73-118/86  4. Hypokalemia - K+ 3.0 yesterday, 2.4 today.  Has received 80 mEq KCl this morning. Will recheck BMET this afternoon. -Metolazone was dc'd yesterday but had already received yesterday's dose.  Tildon Husky , PA-C 10:00 AM 09/18/2016 Pager: [phone]  Patient seen and examined and history reviewed. Agree with above findings and plan. Patient is doing much better with her breathing. Weight is down 15 lbs. I/O negative 5.5 liters. Able to lie flat. Now with hypokalemia. Will replete. Metolazone was discontinued  yesterday. Will reduce lasix to 40 mg IV bid today.  Afib rate is better. Received 2 doses of po amiodarone yesterday. I reviewed PFTs  Compared to April 2016 spirometry is worse with FVC 1.75>> 1.18 liters, FEV1 1.26>>0.80 liters. Ratio 72>>68%. Now with severe diffusion defect. Given PFTs I don't think we can safely use amiodarone. Will stop. Will push metoprolol dose to 50 mg tid for rate control.  Considered DCCV but I don't think she would maintain NSR without AAD If she does not respond to therapy with rate control we could consider Tikosyn in future since most recent QT does not appear prolonged.   Antonetta Clanton [place], [place] 09/18/2016 11:10 AM

## 2016-09-18 NOTE — Progress Notes (Signed)
Pt educated about safety and importance of bed alarm during the night however pt refuses to be on bed alarm. Will continue to round on patient.   Dyllan Kats, RN    

## 2016-09-18 NOTE — Progress Notes (Signed)
Call received from [facility]; informed of held evening metoprolol, due to BP drop. Informed of dose change from 25-'50mg'$  of metoprolol BID. Per [place] Silos, she will place order to change back to '25mg'$  as to accommodate the decreased BP.

## 2016-09-18 NOTE — Progress Notes (Signed)
CRITICAL VALUE ALERT  Critical value received:K=2.5  Date of notification:  03/29  Time of notification:  0633  Critical value read back:Yes.    Nurse who received alert: r.Shaunna Rosetti rn  MD notified (1st page):   medical grp md cov Dr. Martinique  Time of first page:  [phone]  MD notified (2nd page):  Time of second page:  Responding MD:  waiting  Time MD responded:  waiting

## 2016-09-18 NOTE — Progress Notes (Signed)
Per J.Hammond,PA pt is to have '40mg'$  of lasix this morning, and '50mg'$  of metoprolol. Will adjust morning doses to accommodate.

## 2016-09-18 NOTE — Progress Notes (Signed)
Page to [facility] night call PA, to consult on low BP.   Increase of metoprolol dose today, scheduled for second increased dose now, holding until confirmed.  HR 90 BP 91/68

## 2016-09-19 LAB — BASIC METABOLIC PANEL
Anion gap: 11 (ref 5–15)
Anion gap: 9 (ref 5–15)
BUN: 24 mg/dL — ABNORMAL HIGH (ref 6–20)
BUN: 24 mg/dL — ABNORMAL HIGH (ref 6–20)
CO2: 40 mmol/L — ABNORMAL HIGH (ref 22–32)
CO2: 40 mmol/L — ABNORMAL HIGH (ref 22–32)
Calcium: 9.3 mg/dL (ref 8.9–10.3)
Calcium: 9.3 mg/dL (ref 8.9–10.3)
Chloride: 86 mmol/L — ABNORMAL LOW (ref 101–111)
Chloride: 87 mmol/L — ABNORMAL LOW (ref 101–111)
Creatinine, Ser: 1.23 mg/dL — ABNORMAL HIGH (ref 0.44–1.00)
Creatinine, Ser: 1.17 mg/dL — ABNORMAL HIGH (ref 0.44–1.00)
GFR calc Af Amer: 48 mL/min — ABNORMAL LOW (ref >60)
GFR calc Af Amer: 51 mL/min — ABNORMAL LOW (ref >60)
GFR calc non Af Amer: 41 mL/min — ABNORMAL LOW (ref >60)
GFR calc non Af Amer: 44 mL/min — ABNORMAL LOW (ref >60)
Glucose, Bld: 113 mg/dL — ABNORMAL HIGH (ref 65–99)
Glucose, Bld: 112 mg/dL — ABNORMAL HIGH (ref 65–99)
Potassium: 2.6 mmol/L — CL (ref 3.5–5.1)
Potassium: 3.3 mmol/L — ABNORMAL LOW (ref 3.5–5.1)
Sodium: 137 mmol/L (ref 135–145)
Sodium: 136 mmol/L (ref 135–145)

## 2016-09-19 MED ORDER — POTASSIUM CHLORIDE CRYS ER 20 MEQ PO TBCR
40.0000 meq | EXTENDED_RELEASE_TABLET | Freq: Three times a day (TID) | ORAL | Status: DC
  Administered 2016-09-19 – 2016-09-24 (×16): 40 meq via ORAL
  Filled 2016-09-19 (×16): qty 2

## 2016-09-19 MED ORDER — MAGNESIUM OXIDE 400 (241.3 MG) MG PO TABS
400.0000 mg | ORAL_TABLET | Freq: Two times a day (BID) | ORAL | Status: AC
  Administered 2016-09-19 (×2): 400 mg via ORAL
  Filled 2016-09-19 (×2): qty 1

## 2016-09-19 MED ORDER — METOPROLOL TARTRATE 50 MG PO TABS
50.0000 mg | ORAL_TABLET | Freq: Three times a day (TID) | ORAL | Status: DC
  Administered 2016-09-19 – 2016-09-28 (×25): 50 mg via ORAL
  Filled 2016-09-19 (×28): qty 1

## 2016-09-19 MED ORDER — SODIUM CHLORIDE 0.9 % IV SOLN
30.0000 meq | Freq: Once | INTRAVENOUS | Status: AC
  Administered 2016-09-19: 30 meq via INTRAVENOUS
  Filled 2016-09-19: qty 15

## 2016-09-19 MED ORDER — METOPROLOL TARTRATE 50 MG PO TABS: 50.0000 mg | ORAL_TABLET | Freq: Two times a day (BID) | ORAL | Status: DC

## 2016-09-19 MED ORDER — ORAL CARE MOUTH RINSE
15.0000 mL | Freq: Two times a day (BID) | OROMUCOSAL | Status: DC
  Administered 2016-09-19 – 2016-09-28 (×14): 15 mL via OROMUCOSAL

## 2016-09-19 MED ORDER — FUROSEMIDE 40 MG PO TABS
40.0000 mg | ORAL_TABLET | Freq: Two times a day (BID) | ORAL | Status: DC
  Administered 2016-09-19 – 2016-09-22 (×6): 40 mg via ORAL
  Filled 2016-09-19 (×6): qty 1

## 2016-09-19 NOTE — Progress Notes (Signed)
Refused bed alarm. Will continue to monitor patient. 

## 2016-09-19 NOTE — Progress Notes (Signed)
Patient with no complaints or concerns during 7pm - 7am shift. Slept during the night.   Moustapha Tooker, RN 

## 2016-09-19 NOTE — Progress Notes (Signed)
Progress Note  Patient Name: [name] Date of Encounter: 09/19/2016  Primary Cardiologist: Dr. Martinique  Subjective   Pt walked in the hall with PT this morning. She is having slightly increased dyspnea on exertion as compared to yesterday. Her heart rate is up to the 120's and BP is low. She has had no lightheadedness or chest pain.   Inpatient Medications    Scheduled Meds: . atorvastatin  10 mg Oral q1800  . cholecalciferol  1,000 Units Oral Daily  . citalopram  20 mg Oral Daily  . furosemide  40 mg Intravenous Q12H  . levothyroxine  88 mcg Oral QAC breakfast  . mouth rinse  15 mL Mouth Rinse BID  . metoprolol tartrate  25 mg Oral BID  . potassium chloride (KCL MULTIRUN) 30 mEq in 265 mL IVPB  30 mEq Intravenous Once  . potassium chloride  40 mEq Oral TID  . rivaroxaban  20 mg Oral Q supper  . sodium chloride flush  3 mL Intravenous Q12H  . tiotropium  18 mcg Inhalation Daily  . vitamin B-12  100 mcg Oral Daily   Continuous Infusions:  PRN Meds: sodium chloride, acetaminophen, albuterol, ondansetron (ZOFRAN) IV, sodium chloride flush, temazepam   Vital Signs    Vitals:   09/18/16 1800 09/18/16 1937 09/18/16 2125 09/19/16 0406  BP: 91/68 (!) 119/55 102/73 (!) 109/58  Pulse: 90 (!) 112 95 83  Resp:  '20 18 18  '$ Temp:  97.7 F (36.5 C)  98 F (36.7 C)  TempSrc:  Oral  Oral  SpO2:  95% 92% 94%  Weight:    245 lb 14.4 oz (111.5 kg)  Height:        Intake/Output Summary (Last 24 hours) at 09/19/16 0941 Last data filed at 09/19/16 0934  Gross per 24 hour  Intake              720 ml  Output             2321 ml  Net            -1601 ml   Filed Weights   09/17/16 0549 09/18/16 0404 09/19/16 0406  Weight: 250 lb 11.2 oz (113.7 kg) 246 lb 11.1 oz (111.9 kg) 245 lb 14.4 oz (111.5 kg)    Telemetry    Atrial fibrillation, HR in 110's-120's. Occasional PVC's.  - Personally Reviewed  ECG    No new tracings.   Physical Exam   General: Well developed,  Caucasian female appearing in no acute distress. Head: Normocephalic, atraumatic.  Neck: Supple without bruits, JVD at 8cm. Lungs:  Resp regular and unlabored, CTA except few crackles in posterior bases Heart: Irregularly irregular, S1, S2, no S3, S4, or murmur; no rub. Abdomen: Soft, non-tender, non-distended with normoactive bowel sounds. No hepatomegaly. No rebound/guarding. No obvious abdominal masses. Extremities: No clubbing, cyanosis, 2+ pitting edema along lower extremities bilaterally. Distal pedal pulses are 2+ bilaterally. Darkened, thickened skin from mid-calf down. Neuro: Alert and oriented X 3. Moves all extremities spontaneously. Psych: Normal affect.  Labs    Chemistry  Recent Labs Lab 09/15/16 2055  09/18/16 0446 09/18/16 1304 09/19/16 0444  NA 139  < > 136 135 137  K 3.5  < > 2.4* 3.7 2.6*  CL 95*  < > 85* 86* 86*  CO2 35*  < > 40* 38* 40*  GLUCOSE 93  < > 123* 117* 113*  BUN 11  < > 22* 22* 24*  CREATININE 0.93  < >  1.12* 1.19* 1.23*  CALCIUM 8.8*  < > 9.3 9.2 9.3  PROT 6.7  --   --   --   --   ALBUMIN 3.5  --   --   --   --   AST 25  --   --   --   --   ALT 22  --   --   --   --   ALKPHOS 76  --   --   --   --   BILITOT 1.1  --   --   --   --   GFRNONAA 58*  < > 46* 43* 41*  GFRAA >60  < > 53* 50* 48*  ANIONGAP 9  < > '11 11 11  '$ < > = values in this interval not displayed.   Hematology  Recent Labs Lab 09/15/16 1334  WBC 5.8  RBC 4.31  HGB 15.3*  HCT 47.5*  MCV 110.2*  MCH 35.5*  MCHC 32.2  RDW 13.5  PLT 126*    Cardiac Enzymes  Recent Labs Lab 09/15/16 2055 09/16/16 0224 09/16/16 0953  TROPONINI <0.03 <0.03 <0.03   No results for input(s): TROPIPOC in the last 168 hours.   BNP  Recent Labs Lab 09/15/16 1334 09/15/16 2055  BNP 332.6* 348.5*     DDimer No results for input(s): DDIMER in the last 168 hours.   Radiology    Dg Chest 2 View  Result Date: 09/15/2016 CLINICAL DATA:  Shortness breath.  Lung cancer. EXAM: CHEST   2 VIEW COMPARISON:  CT a chest 03/11/2016. FINDINGS: The heart is enlarged. Diffuse interstitial pattern is worse at the lung bases. There is no significant consolidation. Atherosclerotic changes are present at the aortic arch. Degenerative changes are present at both shoulders. IMPRESSION: 1. Cardiomegaly with a diffuse interstitial pattern suggesting edema and congestive heart failure. Infection is considered less likely. 2. Aortic atherosclerosis. Electronically Signed   By: San Morelle M.D.   On: 09/15/2016 14:14    Cardiac Studies   Echocardiogram: 09/17/16 Echo: Study Conclusions  - Left ventricle: The cavity size was normal. Wall thickness was   increased in a pattern of moderate LVH. Systolic function was   moderately reduced. The estimated ejection fraction was in the   range of 35% to 40%. Diffuse hypokinesis. The study is not   technically sufficient to allow evaluation of LV diastolic   function. - Aortic valve: Trileaflet. Sclerosis without stenosis. There was   no regurgitation. - Mitral valve: Calcified annulus. Mildly thickened leaflets . - Left atrium: The atrium was mildly dilated. - Right ventricle: The cavity size was normal. Mildly decreased RV   systolic function. - Right atrium: Moderately dilated. - Tricuspid valve: There was moderate regurgitation. - Pulmonary arteries: PA peak pressure: 49 mm Hg (S). - Inferior vena cava: The vessel was dilated. The respirophasic   diameter changes were blunted (< 50%), consistent with elevated   central venous pressure.  Impressions:  - Compared to a prior study in 2016, the LVEF is reduced now to   35-40%. A-fib wtih RVR is noted. The ascending aorta meausres 4.0   cm. There is mild AI, mild LAE, moderate RAE, moderate TR and   RVSP of 49 mmHg with a dilated IVC.  Patient Profile     76 y.o. female w/ PMH of PAF (on Xarelto), moderate pulmonary HTN, chronic diastolic CHF, lung cancer (treated with XRT and  chemo), COPD (on home oxygen), OSA, PVD (s/p aorto bi-iliac  stent grafting and tthoracic aneurysm - last measured in March of 2017 and is 4.5 x 3.6 x 2.8 - continuing to be followed by Dr. Donnetta Hutching),  DVT (while on coumadin, currently on Xarelto) who presented to [facility] ED on 09/15/2016 for a 3 week history of progressive dyspnea.   Assessment & Plan    1. Acute on Chronic diastolic CHF - She gained 20 lbs since 04/2016. Failed outpatient treatment with increased PO diuretics.  - BNP at 348 on admission with CXR showing edema and CHF. TSH normal.  - echo in 03/2015 showed a preserved EF of 55-60% with Grade 2 DD. Repeat Echo shows EF 35-40%. Suspect this is tachycardia mediated. - started on IV Lasix '80mg'$  BID and Metolazone with a net output of -6.8L thus far. Weight down 14 lbs- 245 today (was at 243 lbs in 04/2016). Pt was hypokalemic, metolazone stopped. -Now on lasix 40 mg IV bid with continued good diuresis. 2L urine output yesterday. Still appears voolume overloaded. Continue current dosing.  -SCr is elevated 1.12--> 1.23 in setting of diuresis. -Serum magnesium 1.7. Will supplement.  -Breathing is steadily improving.  - PTA Cardizem and Losartan held in the setting of low-BP's with diuresis. Cardizem is contraindicated in setting of low EF.   2. Atrial Fibrillation with RVR -Afib rate is back up to 120's off amiodarone and pt is slightly more short of breath on exertion. No chest pain or lightheadedness.  -Was on Lopressor 12.'5mg'$  BID and Cardizem CD '120mg'$  daily as an outpatient but Cardizem held in the setting of hypotension.  -Per Dr. Martinique: Antiarrhythmic drug therapy limited. Multaq and flecainide contraindicated with CHF and low EF.   -She was started on amiodarone with rate slightly improved to 100-110's, but PFT's checked and compared to April 2016 spirometry is worse with FVC 1.75>> 1.18 liters, FEV1 1.26>>0.80 liters. Ratio 72>>68%. Now with severe diffusion defect. Amiodarone  stopped.  -Attempted to increase metoprolol to 50 mg for rate control yesterday, however, BP dropped to 91/68, so metoprolol dose decreased back to 25 mg bid. BP this morning 109/58. Pt was asymptomatic with lower BP.  Per Dr. Martinique will maintain metoprolol at 50 mg BID as she needs the rate control as long as SBP >90.  -She is not a good candidate for Afib ablation. AV node ablation would necessitate a pacemaker.  -Dr. Martinique considered DCCV but he did not think she would maintain NSR without AAD If she does not respond to therapy with rate control we could consider Tikosyn in future since most recent QT does not appear prolonged. Would need stable potassium levels.  - This patients CHA2DS2-VASc Score and unadjusted Ischemic Stroke Rate (% per year) is equal to 11.2 % stroke rate/year from a score of 7 (HTN, PVD, Female, Age (2), TE (2)). Continue Xarelto for anticoagulation.   3. HTN - Hypotensive with increased BB, 91/68. Dose decreased. BP this morning 109/58 -On no other BP lowering meds except lasix.  4. Hypokalemia - K+ 2.6 this morning. Replacing with IV 30 mEq X1 and oral potassium 40 mEq TID -Metolazone was dc'd 3/28   Tildon Husky , [place] 9:41 AM 09/19/2016 Pager: [phone] Patient seen and examined and history reviewed. Agree with above findings and plan. Patient is feeling well. Breathing is clearly better. Weight is down 18 lbs since admission and close to dry weight. Leg edema is markedly improved. Will stop IV lasix and switch to po 40 mg bid.   Unfortunately Metoprolol was  held and dose reduced due to soft BP last night. As a result HR up to 130 this am. This is counterproductive. I have increased metoprolol to 50 mg tid. Needs to get it as long as BP > 90. Really no other options for rate control. I think BP will improve with reducing diuretics but rate control is key. Once HR control is good and volume status is stable she can go home- hopefully this  weekend.  Melanye Hiraldo [place], [place] 09/19/2016 12:26 PM

## 2016-09-19 NOTE — Progress Notes (Signed)
Pt refused bed alarm. Instructed to call for assistance as needed.  Call bell at reach.  Karie Kirks, Therapist, sports.

## 2016-09-19 NOTE — Consult Note (Signed)
   [facility] CM Inpatient Consult   09/19/2016  [name] 1941-02-02 [id number]   Patient is in the [facility] ACO Registry.  Chart review reveals the patient is [name] is a 76 yo WF well known to me. She has a history of paroxysmal Afib, diastolic CHF, and COPD on home oxygen. She reports that over the past 3 weeks she has experienced increasing SOB with DOE and fatigue. Denies any chest pain. Seen in Dr. Jacquiline Doe office and noted to be in Afib. Metoprolol dose doubled and given IM lasix. Patient states she had a little relief but then it got worse again. Oral lasix doubled to 40 mg bid without improvement. Patient does not weigh regularly at home. Does note increasing LE edema and increased abdominal girth. Patient has been assigned EMMI HF automated calls for post hospital follow up by inpatient RNCM. Met with the patient to explain [facility] Care Management for community needs.  Patient states she does not feel she needs home visit but the calls would be fine.  Explained that if she has symptoms she would receive a follow up call by a live nurse which is [place].  No furhter needs noted.  For questions, please contact:  Natividad Brood, RN BSN [facility] Liaison  [phone] business mobile phone Toll free office [phone]

## 2016-09-19 NOTE — Care Management Important Message (Signed)
Important Message  Patient Details  Name: BRONTE SABADO MRN: [id number] Date of Birth: 13-Dec-1940   Medicare Important Message Given:  Yes    Todrick Siedschlag 09/19/2016, 11:03 AM

## 2016-09-19 NOTE — Progress Notes (Signed)
CRITICAL VALUE ALERT  Critical value received:  Potassium 2.6  Date of notification:  09/19/2016  Time of notification:  6:10  Critical value read back:Yes.    Nurse who received alert:  Opal Sidles, RN  MD notified (1st page):  Bhagat, cardiology  Time of first page:  6:20  MD notified (2nd page):  Time of second page:  Responding MD:    Time MD responded:    New order placed for Potassium IV and PO.  Will continue to monitor.  Zoya Sprecher

## 2016-09-20 LAB — BASIC METABOLIC PANEL
Anion gap: 8 (ref 5–15)
BUN: 27 mg/dL — ABNORMAL HIGH (ref 6–20)
CO2: 39 mmol/L — ABNORMAL HIGH (ref 22–32)
Calcium: 9.2 mg/dL (ref 8.9–10.3)
Chloride: 89 mmol/L — ABNORMAL LOW (ref 101–111)
Creatinine, Ser: 1.12 mg/dL — ABNORMAL HIGH (ref 0.44–1.00)
GFR calc Af Amer: 53 mL/min — ABNORMAL LOW (ref >60)
GFR calc non Af Amer: 46 mL/min — ABNORMAL LOW (ref >60)
Glucose, Bld: 114 mg/dL — ABNORMAL HIGH (ref 65–99)
Potassium: 3.6 mmol/L (ref 3.5–5.1)
Sodium: 136 mmol/L (ref 135–145)

## 2016-09-20 LAB — MAGNESIUM: Magnesium: 1.9 mg/dL (ref 1.7–2.4)

## 2016-09-20 NOTE — Progress Notes (Signed)
Patient refused bed alarm. Will continue to monitor patient. 

## 2016-09-20 NOTE — Progress Notes (Signed)
Progress Note  Patient Name: [name] Date of Encounter: 09/20/2016  Primary Cardiologist: [place]  Subjective   Feels she is improving. Edema decreased. Still on more than usual O2 (3L instead of 2L/min). Net 9L fluid removed, weight down 6 kg (246 lb today). Per her report, usual weight 240-245 lb, but she is still edematous.  Inpatient Medications    Scheduled Meds: . atorvastatin  10 mg Oral q1800  . cholecalciferol  1,000 Units Oral Daily  . citalopram  20 mg Oral Daily  . furosemide  40 mg Oral BID  . levothyroxine  88 mcg Oral QAC breakfast  . mouth rinse  15 mL Mouth Rinse BID  . metoprolol tartrate  50 mg Oral TID  . potassium chloride  40 mEq Oral TID  . rivaroxaban  20 mg Oral Q supper  . sodium chloride flush  3 mL Intravenous Q12H  . tiotropium  18 mcg Inhalation Daily  . vitamin B-12  100 mcg Oral Daily   Continuous Infusions:  PRN Meds: sodium chloride, acetaminophen, albuterol, ondansetron (ZOFRAN) IV, sodium chloride flush, temazepam   Vital Signs    Vitals:   09/20/16 0634 09/20/16 0741 09/20/16 1054 09/20/16 1200  BP: (!) 86/68  110/77 96/60  Pulse:   (!) 111 (!) 58  Resp:    18  Temp:    98 F (36.7 C)  TempSrc:    Oral  SpO2:  (!) 89%  99%  Weight:      Height:        Intake/Output Summary (Last 24 hours) at 09/20/16 1359 Last data filed at 09/20/16 0930  Gross per 24 hour  Intake              480 ml  Output             1575 ml  Net            -1095 ml   Filed Weights   09/18/16 0404 09/19/16 0406 09/20/16 0539  Weight: 111.9 kg (246 lb 11.1 oz) 111.5 kg (245 lb 14.4 oz) 111.8 kg (246 lb 8 oz)    Telemetry    AF with borderline rate control - average 95-100 bpm. - Personally Reviewed  Physical Exam  Sleeping upright, but appears comfortable otherwise GEN: No acute distress.   Neck: hard to evaluate JVD Cardiac: irregular, no murmurs, rubs, or gallops. 2+ pretibial edema, chronic brawny changes Respiratory: Clear to  auscultation bilaterally. GI: Soft, nontender, non-distended  MS: No edema; No deformity. Neuro:  Nonfocal  Psych: Normal affect   Labs    Chemistry Recent Labs Lab 09/15/16 2055  09/19/16 0444 09/19/16 1410 09/20/16 0331  NA 139  < > 137 136 136  K 3.5  < > 2.6* 3.3* 3.6  CL 95*  < > 86* 87* 89*  CO2 35*  < > 40* 40* 39*  GLUCOSE 93  < > 113* 112* 114*  BUN 11  < > 24* 24* 27*  CREATININE 0.93  < > 1.23* 1.17* 1.12*  CALCIUM 8.8*  < > 9.3 9.3 9.2  PROT 6.7  --   --   --   --   ALBUMIN 3.5  --   --   --   --   AST 25  --   --   --   --   ALT 22  --   --   --   --   ALKPHOS 76  --   --   --   --  BILITOT 1.1  --   --   --   --   GFRNONAA 58*  < > 41* 44* 46*  GFRAA >60  < > 48* 51* 53*  ANIONGAP 9  < > '11 9 8  '$ < > = values in this interval not displayed.   Hematology Recent Labs Lab 09/15/16 1334  WBC 5.8  RBC 4.31  HGB 15.3*  HCT 47.5*  MCV 110.2*  MCH 35.5*  MCHC 32.2  RDW 13.5  PLT 126*    Cardiac Enzymes Recent Labs Lab 09/15/16 2055 09/16/16 0224 09/16/16 0953  TROPONINI <0.03 <0.03 <0.03   No results for input(s): TROPIPOC in the last 168 hours.   BNP Recent Labs Lab 09/15/16 1334 09/15/16 2055  BNP 332.6* 348.5*     DDimer No results for input(s): DDIMER in the last 168 hours.   Radiology    No results found.  Cardiac Studies   Echocardiogram: 09/17/16 Echo: Study Conclusions  - Left ventricle: The cavity size was normal. Wall thickness was increased in a pattern of moderate LVH. Systolic function was moderately reduced. The estimated ejection fraction was in the range of 35% to 40%. Diffuse hypokinesis. The study is not technically sufficient to allow evaluation of LV diastolic function. - Aortic valve: Trileaflet. Sclerosis without stenosis. There was no regurgitation. - Mitral valve: Calcified annulus. Mildly thickened leaflets . - Left atrium: The atrium was mildly dilated. - Right ventricle: The cavity size  was normal. Mildly decreased RV systolic function. - Right atrium: Moderately dilated. - Tricuspid valve: There was moderate regurgitation. - Pulmonary arteries: PA peak pressure: 49 mm Hg (S). - Inferior vena cava: The vessel was dilated. The respirophasic diameter changes were blunted (<50%), consistent with elevated central venous pressure.  Impressions:  - Compared to a prior study in 2016, the LVEF is reduced now to 35-40%. A-fib wtih RVR is noted. The ascending aorta meausres 4.0 cm. There is mild AI, mild LAE, moderate RAE, moderate TR and RVSP of 49 mmHg with a dilated IVC.  Patient Profile     76 y.o. female w/ PMH of PAF (on Xarelto), moderate pulmonary HTN, chronic diastolic CHF, lung cancer (treated with XRT and chemo), COPD (on home oxygen), OSA, PVD (s/p aorto bi-iliac stent grafting and tthoracic aneurysm), DVT (while on coumadin, currently on Xarelto) who presented to [facility] ED on 09/15/2016 for a 3 week history of progressive dyspnea.   Assessment & Plan    1. Acute on Chronic diastolic CHF - She gained 20 lbs since 04/2016. Failed outpatient treatment with increased PO diuretics.  - BNP at 348 on admission with CXR showing edema and CHF. TSH normal.  - echo in 03/2015 showed a preserved EF of 55-60% with Grade 2 DD. Repeat Echo shows EF 35-40%. Suspect this is tachycardia mediated. - started on IV Lasix '80mg'$  BID and Metolazone. Pt was hypokalemic, metolazone stopped. - Now on lasix 40 mg IV bid with continued good diuresis. Still appears voolume overloaded. Continue current dosing.  -SCr is improving now, after metolazone was stopped, despite continued diuresis. - PTA Cardizem and Losartan held in the setting of low-BP's with diuresis. Cardizem is contraindicated in setting of low EF. Use beta blocker for rate control and CHF.   2. Atrial Fibrillation with RVR -Afib rate is a little better today.  -Was on Lopressor 12.'5mg'$  BID and Cardizem CD  '120mg'$  daily as an outpatient but Cardizem held in the setting of hypotension/low EF.  -  Antiarrhythmic drug  therapy limited. Multaq and flecainide contraindicated with CHF and low EF.   -She was started on amiodarone with rate slightly improved to 100-110's, but PFT's checked and compared to April 2016 spirometry is worse with FVC 1.75>> 1.18 liters, FEV1 1.26>>0.80 liters. Ratio 72>>68%. Now with severe diffusion defect. Amiodarone stopped.  as she needs the rate control as long as SBP >90.  -Dr. [place] considered DCCV but he did not think she would maintain NSR without AAD If she does not respond to therapy with rate control we could consider Tikosyn in future since most recent QT does not appear prolonged. Would need stable potassium levels.  - This patients CHA2DS2-VASc Score and unadjusted Ischemic Stroke Rate (% per year) is equal to 11.2 % stroke rate/year from a score of 7 (HTN, PVD, Female, Age (2), TE (2)). Continue Xarelto for anticoagulation.   3. HTN - Occasionally mildly hypotensive, but not symptomatic.  4. Hypokalemia - improved: K+ 3.6 this morning.   Signed, Sanda Klein, MD  09/20/2016, 1:59 PM

## 2016-09-21 MED ORDER — DIGOXIN 125 MCG PO TABS
0.1250 mg | ORAL_TABLET | Freq: Every day | ORAL | Status: DC
  Administered 2016-09-22 – 2016-09-28 (×7): 0.125 mg via ORAL
  Filled 2016-09-21 (×7): qty 1

## 2016-09-21 MED ORDER — DIGOXIN 125 MCG PO TABS
0.2500 mg | ORAL_TABLET | Freq: Three times a day (TID) | ORAL | Status: AC
  Administered 2016-09-21 (×3): 0.25 mg via ORAL
  Filled 2016-09-21 (×3): qty 2

## 2016-09-21 MED ORDER — DIGOXIN 125 MCG PO TABS: 0.1250 mg | ORAL_TABLET | Freq: Every day | ORAL | Status: DC

## 2016-09-21 NOTE — Progress Notes (Signed)
Patient is resting in bed.

## 2016-09-21 NOTE — Progress Notes (Signed)
Patients niece arrives to visit and pt wishes to walk the hallways with her. Pt exhibits good independence in transfers and bathroom trips today, so this plan is encouraged. Pt provided O2 tank and walker, niece and pt express confidence in the idea and are instructed to grab staff if they need anything further.    Pt visualized on the beginning of her walk, she is doing well.

## 2016-09-21 NOTE — Progress Notes (Signed)
At bedside with patient, Central Tele calls to inform that pt had a bout of tachycardia; pt was ambulating from restroom.   Second call from central tele, while still at bedside; stating pt is ringing out asystole. Leads checked, box shows baseline afib.

## 2016-09-21 NOTE — Progress Notes (Signed)
Progress Note  Patient Name: [name] Date of Encounter: 09/21/2016  Primary Cardiologist: [place]  Subjective   She is a little improved, less short of breath. Still on 3 L O2. Still with mild orthopnea and substantial leg edema. Urine output suggests net 1 L diuresis since yesterday, but paradoxically weight has increased. Rate control remains mediocre with heart rates around the 100 at rest, 140 with ambulation. Metoprolol has been administered without interruption over the last 24 hours.  Systolic blood pressure around 100, no dizziness or syncope.  Inpatient Medications    Scheduled Meds: . atorvastatin  10 mg Oral q1800  . cholecalciferol  1,000 Units Oral Daily  . citalopram  20 mg Oral Daily  . furosemide  40 mg Oral BID  . levothyroxine  88 mcg Oral QAC breakfast  . mouth rinse  15 mL Mouth Rinse BID  . metoprolol tartrate  50 mg Oral TID  . potassium chloride  40 mEq Oral TID  . rivaroxaban  20 mg Oral Q supper  . sodium chloride flush  3 mL Intravenous Q12H  . tiotropium  18 mcg Inhalation Daily  . vitamin B-12  100 mcg Oral Daily   Continuous Infusions:  PRN Meds: sodium chloride, acetaminophen, albuterol, ondansetron (ZOFRAN) IV, sodium chloride flush, temazepam   Vital Signs    Vitals:   09/21/16 0636 09/21/16 0712 09/21/16 0833 09/21/16 1055  BP: 100/62  105/74 100/64  Pulse: 99 (!) 101 65 87  Resp: 18 18    Temp: 97.4 F (36.3 C)     TempSrc: Oral     SpO2: 94% 95%    Weight: 112.3 kg (247 lb 8 oz)     Height:        Intake/Output Summary (Last 24 hours) at 09/21/16 1129 Last data filed at 09/21/16 1100  Gross per 24 hour  Intake              980 ml  Output             1901 ml  Net             -921 ml   Filed Weights   09/19/16 0406 09/20/16 0633 09/21/16 0636  Weight: 111.5 kg (245 lb 14.4 oz) 111.8 kg (246 lb 8 oz) 112.3 kg (247 lb 8 oz)    Telemetry    Atrial fibrillation with average ventricular rate 100-110 - Personally  Reviewed  Physical Exam  Sitting upright at edge of bed, looks comfortable GEN: No acute distress.   Neck:  Hard to see, roughly 6-8 centimeters JVD Cardiac:  irregular, no murmurs, rubs, or gallops.  Respiratory: Clear to auscultation bilaterally. GI: Soft, nontender, non-distended  MS:  2-3 plus hard, chronic brawny edema in both calves; No deformity. Neuro:  Nonfocal  Psych: Normal affect   Labs    Chemistry Recent Labs Lab 09/15/16 2055  09/19/16 0444 09/19/16 1410 09/20/16 0331  NA 139  < > 137 136 136  K 3.5  < > 2.6* 3.3* 3.6  CL 95*  < > 86* 87* 89*  CO2 35*  < > 40* 40* 39*  GLUCOSE 93  < > 113* 112* 114*  BUN 11  < > 24* 24* 27*  CREATININE 0.93  < > 1.23* 1.17* 1.12*  CALCIUM 8.8*  < > 9.3 9.3 9.2  PROT 6.7  --   --   --   --   ALBUMIN 3.5  --   --   --   --  AST 25  --   --   --   --   ALT 22  --   --   --   --   ALKPHOS 76  --   --   --   --   BILITOT 1.1  --   --   --   --   GFRNONAA 58*  < > 41* 44* 46*  GFRAA >60  < > 48* 51* 53*  ANIONGAP 9  < > '11 9 8  '$ < > = values in this interval not displayed.   Hematology Recent Labs Lab 09/15/16 1334  WBC 5.8  RBC 4.31  HGB 15.3*  HCT 47.5*  MCV 110.2*  MCH 35.5*  MCHC 32.2  RDW 13.5  PLT 126*    Cardiac Enzymes Recent Labs Lab 09/15/16 2055 09/16/16 0224 09/16/16 0953  TROPONINI <0.03 <0.03 <0.03   No results for input(s): TROPIPOC in the last 168 hours.   BNP Recent Labs Lab 09/15/16 1334 09/15/16 2055  BNP 332.6* 348.5*      Cardiac Studies   - Left ventricle: The cavity size was normal. Wall thickness was increased in a pattern of moderate LVH. Systolic function was moderately reduced. The estimated ejection fraction was in the range of 35% to 40%. Diffuse hypokinesis. The study is not technically sufficient to allow evaluation of LV diastolic function. - Aortic valve: Trileaflet. Sclerosis without stenosis. There was no regurgitation. - Mitral valve: Calcified  annulus. Mildly thickened leaflets . - Left atrium: The atrium was mildly dilated. - Right ventricle: The cavity size was normal. Mildly decreased RV systolic function. - Right atrium: Moderately dilated. - Tricuspid valve: There was moderate regurgitation. - Pulmonary arteries: PA peak pressure: 49 mm Hg (S). - Inferior vena cava: The vessel was dilated. The respirophasic diameter changes were blunted (<50%), consistent with elevated central venous pressure.  Impressions:  - Compared to a prior study in 2016, the LVEF is reduced now to 35-40%. A-fib wtih RVR is noted. The ascending aorta meausres 4.0 cm. There is mild AI, mild LAE, moderate RAE, moderate TR and RVSP of 49 mmHg with a dilated IVC.  Patient Profile     76 y.o. female w/ PMH of PAF (on Xarelto), moderate pulmonary HTN, chronic diastolic CHF, lung cancer (treated with XRT and chemo), COPD (on home oxygen), OSA, PVD (s/p aorto bi-iliac stent grafting and tthoracic aneurysm), DVT (while on coumadin, currently on Xarelto) who presented to [facility] ED on 09/15/2016 for a 3 week history of progressive dyspnea.  Echo shows reduction in left ventricular ejection fraction, suspicion for tachycardia mediated cardiomyopathy due to poor ventricular rate control. Previous therapy with amiodarone was stopped due to worsening pulmonary function tests.  Assessment & Plan    1. Acute on Chronic diastolic CHF - She gained 20 lbs since 04/2016. Failed outpatient treatment with increased PO diuretics.  - BNP at 348 on admission with CXR showing edema and CHF. TSH normal.  - echo in 03/2015 showed a preserved EF of 55-60% with Grade 2 DD. Repeat Echo shows EF 35-40%. Suspect this is tachycardia mediated. - started on IV Lasix '80mg'$  BID and Metolazone. Pt was hypokalemic, metolazone stopped. - Now on lasix 40 mg IV bid with continued good diuresis. Still appears volume overloaded. Continue current dosing.  -SCr is improving  now, after metolazone was stopped, despite continued diuresis. Recheck labs in a.m. - PTA Cardizem and Losartan held in the setting of low-BP's with diuresis. Cardizem is contraindicated in  setting of low EF. Use beta blocker for rate control and CHF.   2. Atrial Fibrillation with RVR -Afib rate Control remains mediocre. Add digoxin now that her renal function is better and potassium is normal -Was on Lopressor 12.'5mg'$  BID and Cardizem CD '120mg'$  daily as an outpatient but Cardizem held in the setting of hypotension/low EF.  -  Antiarrhythmic drug therapy limited. Multaq and flecainide contraindicated with CHF and low EF. She was started on amiodarone with rate slightly improved to 100-110's, but PFT's checked and compared to April 2016 spirometry is worse with FVC 1.75>>1.18 liters, FEV1 1.26>>0.80 liters. Ratio 72>>68%. Now with severe diffusion defect. Amiodarone stopped.   -Dr. [place] considered DCCV but he did notthink she would maintain NSR without AAD. If she does not respond to therapy with rate control we could consider Tikosyn in future since most recent QT does not appear prolonged. Would need stablepotassium levels.  - This patients CHA2DS2-VASc Score and unadjusted Ischemic Stroke Rate (% per year) is equal to 11.2 % stroke rate/year from a score of 7(HTN, PVD, Female, Age (2), TE (2)). Continue Xarelto for anticoagulation.   3. HTN - Occasionally mildly hypotensive, but not symptomatic.Metoprolol should be held only for SBP under 19 mmHg or symptomatic hypotension.  4. Hypokalemia - improved: K+ 3.6 Yesterday, labs not checked today  Signed, Sanda Klein, MD  09/21/2016, 11:29 AM

## 2016-09-22 LAB — BASIC METABOLIC PANEL
Anion gap: 7 (ref 5–15)
BUN: 26 mg/dL — ABNORMAL HIGH (ref 6–20)
CO2: 38 mmol/L — ABNORMAL HIGH (ref 22–32)
Calcium: 9.2 mg/dL (ref 8.9–10.3)
Chloride: 94 mmol/L — ABNORMAL LOW (ref 101–111)
Creatinine, Ser: 1 mg/dL (ref 0.44–1.00)
GFR calc Af Amer: >60 mL/min (ref >60)
GFR calc non Af Amer: 53 mL/min — ABNORMAL LOW (ref >60)
Glucose, Bld: 105 mg/dL — ABNORMAL HIGH (ref 65–99)
Potassium: 4.5 mmol/L (ref 3.5–5.1)
Sodium: 139 mmol/L (ref 135–145)

## 2016-09-22 MED ORDER — FUROSEMIDE 10 MG/ML IJ SOLN
80.0000 mg | Freq: Two times a day (BID) | INTRAMUSCULAR | Status: DC
  Administered 2016-09-22 – 2016-09-25 (×6): 80 mg via INTRAVENOUS
  Filled 2016-09-22 (×7): qty 8

## 2016-09-22 NOTE — Progress Notes (Signed)
Progress Note  Patient Name: TEA COLLUMS Date of Encounter: 09/22/2016  Primary Cardiologist: [place]  Subjective   Breathing OK at rest and walking halls  No CP     Inpatient Medications    Scheduled Meds: . atorvastatin  10 mg Oral q1800  . cholecalciferol  1,000 Units Oral Daily  . citalopram  20 mg Oral Daily  . digoxin  0.125 mg Oral Daily  . furosemide  40 mg Oral BID  . levothyroxine  88 mcg Oral QAC breakfast  . mouth rinse  15 mL Mouth Rinse BID  . metoprolol tartrate  50 mg Oral TID  . potassium chloride  40 mEq Oral TID  . rivaroxaban  20 mg Oral Q supper  . sodium chloride flush  3 mL Intravenous Q12H  . tiotropium  18 mcg Inhalation Daily  . vitamin B-12  100 mcg Oral Daily   Continuous Infusions:  PRN Meds: sodium chloride, acetaminophen, albuterol, ondansetron (ZOFRAN) IV, sodium chloride flush, temazepam   Vital Signs    Vitals:   09/21/16 1239 09/21/16 1658 09/21/16 2030 09/22/16 0642  BP:  '99/65 99/62 97/63 '$  Pulse: 97 (!) 101 98 96  Resp:   18 18  Temp:   97.4 F (36.3 C) 97.7 F (36.5 C)  TempSrc:   Oral Oral  SpO2:   96% 95%  Weight:    248 lb 1.6 oz (112.5 kg)  Height:        Intake/Output Summary (Last 24 hours) at 09/22/16 0744 Last data filed at 09/22/16 4854  Gross per 24 hour  Intake             1460 ml  Output             2100 ml  Net             -640 ml   Filed Weights   09/20/16 0633 09/21/16 0636 09/22/16 0642  Weight: 246 lb 8 oz (111.8 kg) 247 lb 8 oz (112.3 kg) 248 lb 1.6 oz (112.5 kg)    Telemetry     Personally Reviewed  Physical Exam   GEN: No acute distress.   Neck: Full   Cardiac: irregular, no murmurs, rubs, or gallops. 2+ pretibial edema, chronic brawny changes Respiratory: Clear to auscultation bilaterally. GI: Soft, nontender, non-distended  MS: Chronic stasis changes  Tr  edema; No deformity. Neuro:  Nonfocal  Psych: Normal affect   Labs    Chemistry Recent Labs Lab 09/15/16 2055   09/19/16 1410 09/20/16 0331 09/22/16 0531  NA 139  < > 136 136 139  K 3.5  < > 3.3* 3.6 4.5  CL 95*  < > 87* 89* 94*  CO2 35*  < > 40* 39* 38*  GLUCOSE 93  < > 112* 114* 105*  BUN 11  < > 24* 27* 26*  CREATININE 0.93  < > 1.17* 1.12* 1.00  CALCIUM 8.8*  < > 9.3 9.2 9.2  PROT 6.7  --   --   --   --   ALBUMIN 3.5  --   --   --   --   AST 25  --   --   --   --   ALT 22  --   --   --   --   ALKPHOS 76  --   --   --   --   BILITOT 1.1  --   --   --   --  GFRNONAA 58*  < > 44* 46* 53*  GFRAA >60  < > 51* 53* >60  ANIONGAP 9  < > '9 8 7  '$ < > = values in this interval not displayed.   Hematology  Recent Labs Lab 09/15/16 1334  WBC 5.8  RBC 4.31  HGB 15.3*  HCT 47.5*  MCV 110.2*  MCH 35.5*  MCHC 32.2  RDW 13.5  PLT 126*    Cardiac Enzymes  Recent Labs Lab 09/15/16 2055 09/16/16 0224 09/16/16 0953  TROPONINI <0.03 <0.03 <0.03   No results for input(s): TROPIPOC in the last 168 hours.   BNP  Recent Labs Lab 09/15/16 1334 09/15/16 2055  BNP 332.6* 348.5*     DDimer No results for input(s): DDIMER in the last 168 hours.   Radiology    No results found.  Cardiac Studies   Echocardiogram: 09/17/16 Echo: Study Conclusions  - Left ventricle: The cavity size was normal. Wall thickness was increased in a pattern of moderate LVH. Systolic function was moderately reduced. The estimated ejection fraction was in the range of 35% to 40%. Diffuse hypokinesis. The study is not technically sufficient to allow evaluation of LV diastolic function. - Aortic valve: Trileaflet. Sclerosis without stenosis. There was no regurgitation. - Mitral valve: Calcified annulus. Mildly thickened leaflets . - Left atrium: The atrium was mildly dilated. - Right ventricle: The cavity size was normal. Mildly decreased RV systolic function. - Right atrium: Moderately dilated. - Tricuspid valve: There was moderate regurgitation. - Pulmonary arteries: PA peak pressure:  49 mm Hg (S). - Inferior vena cava: The vessel was dilated. The respirophasic diameter changes were blunted (<50%), consistent with elevated central venous pressure.  Impressions:  - Compared to a prior study in 2016, the LVEF is reduced now to 35-40%. A-fib wtih RVR is noted. The ascending aorta meausres 4.0 cm. There is mild AI, mild LAE, moderate RAE, moderate TR and RVSP of 49 mmHg with a dilated IVC.  Patient Profile     76 y.o. female w/ PMH of PAF (on Xarelto), moderate pulmonary HTN, chronic diastolic CHF, lung cancer (treated with XRT and chemo), COPD (on home oxygen), OSA, PVD (s/p aorto bi-iliac stent grafting and tthoracic aneurysm), DVT (while on coumadin, currently on Xarelto) who presented to [facility] ED on 09/15/2016 for a 3 week history of progressive dyspnea.  Pt failled outpt Rx for CHF    Assessment & Plan    1. Acute on Chronic diastolic CHF   - echo in 04/9416 showed a preserved EF of 55-60% with Grade 2 DD. Repeat Echo shows EF 35-40%. Suspect this is tachycardia mediated. Volume status is prob up some but not bad  Continue PO lasix  2. Atrial Fibrillation with RVR For now plan rate control and anticoagulation  Could consider tikosyn in future (other AADs not good)  Rates OK  Keep on same meds  - 3. HTN - BP 90s to 100s  Pt asymptomatic    4. Hypokalemia - K is normal   Get O2 sats with walking with and without OK  Probable d/c today .    Signed, Dorris Carnes, MD  09/22/2016, 7:44 AM

## 2016-09-22 NOTE — Progress Notes (Signed)
qPhysical Therapy Treatment Patient Details Name: [name] MRN: [id number] DOB: 02-22-1941 Today's Date: 09/22/2016    History of Present Illness Pt is 76 yo with history of paroxysmal Afib, diastolic CHF, and COPD on home oxygen. She reports 3 week h/o increasing SOB with DOE and fatigue. Diagnosed with acute on chronic CHF.    PT Comments    Pt remains to become SOB with mobility. SpO2 dec into 70s on 3LO2 via [place]. Recovers into 80s after 1 minute of rest. Pt con't to be educated on purse lipped breathing but doesn't do it on her own. Acute PT to con't to follow.    Follow Up Recommendations  Home health PT;Supervision - Intermittent     Equipment Recommendations  None recommended by PT    Recommendations for Other Services       Precautions / Restrictions Precautions Precautions: Fall Precaution Comments: discolored and swollen LEs, SOB with all activity Required Braces or Orthoses: Other Brace/Splint Restrictions Weight Bearing Restrictions: No    Mobility  Bed Mobility Overal bed mobility: Modified Independent             General bed mobility comments: pt used bed rails, increased time, SOB, SpO3 at 82% on 3LO2 via [place]  Transfers Overall transfer level: Needs assistance Equipment used: Rolling walker (2 wheeled) Transfers: Sit to/from Stand Sit to Stand: Supervision         General transfer comment: v/c's for hand placement, not pull up on RW  Ambulation/Gait Ambulation/Gait assistance: Supervision Ambulation Distance (Feet): 100 Feet Assistive device: Rolling walker (2 wheeled) Gait Pattern/deviations: Step-through pattern Gait velocity: slow Gait velocity interpretation: Below normal speed for age/gender General Gait Details: 4 standing rest breaks, SOB, DOE 3/4. SpO2 into 70s on 3LO2 via [place]. pt into 80s once seated. pt heavily reliant on RW, flexed trunk   Stairs            Wheelchair Mobility    Modified Rankin (Stroke Patients  Only)       Balance Overall balance assessment: Needs assistance Sitting-balance support: Feet supported;No upper extremity supported Sitting balance-Leahy Scale: Good     Standing balance support: During functional activity Standing balance-Leahy Scale: Fair Standing balance comment: able to stand statically without RW however requires RW for safe amb                            Cognition Arousal/Alertness: Awake/alert Behavior During Therapy: WFL for tasks assessed/performed Overall Cognitive Status: Within Functional Limits for tasks assessed                                        Exercises      General Comments General comments (skin integrity, edema, etc.): discolored bilat LEs      Pertinent Vitals/Pain Pain Assessment: No/denies pain    Home Living                      Prior Function            PT Goals (current goals can now be found in the care plan section) Progress towards PT goals: Progressing toward goals    Frequency    Min 3X/week      PT Plan Current plan remains appropriate    Co-evaluation  End of Session Equipment Utilized During Treatment: Gait belt;Oxygen Activity Tolerance: Patient tolerated treatment well;Patient limited by fatigue Patient left: in chair;with call bell/phone within reach;with chair alarm set Nurse Communication: Mobility status PT Visit Diagnosis: Muscle weakness (generalized) (M62.81);Other abnormalities of gait and mobility (R26.89)     Time: 6770-3403 PT Time Calculation (min) (ACUTE ONLY): 29 min  Charges:  $Gait Training: 23-37 mins                    G Codes:          [place] Lopes 09/22/2016, 9:38 AM   Kittie Plater, PT, DPT Pager #: [phone] Office #: [phone]

## 2016-09-23 LAB — BASIC METABOLIC PANEL
Anion gap: 8 (ref 5–15)
BUN: 25 mg/dL — ABNORMAL HIGH (ref 6–20)
CO2: 36 mmol/L — ABNORMAL HIGH (ref 22–32)
Calcium: 9.1 mg/dL (ref 8.9–10.3)
Chloride: 94 mmol/L — ABNORMAL LOW (ref 101–111)
Creatinine, Ser: 0.97 mg/dL (ref 0.44–1.00)
GFR calc Af Amer: >60 mL/min (ref >60)
GFR calc non Af Amer: 55 mL/min — ABNORMAL LOW (ref >60)
Glucose, Bld: 92 mg/dL (ref 65–99)
Potassium: 4.4 mmol/L (ref 3.5–5.1)
Sodium: 138 mmol/L (ref 135–145)

## 2016-09-23 NOTE — Progress Notes (Signed)
Progress Note  Patient Name: [name] Date of Encounter: 09/23/2016  Primary Cardiologist: [place]    Subjective   Breathing OK at rest  No CP    Inpatient Medications    Scheduled Meds: . atorvastatin  10 mg Oral q1800  . cholecalciferol  1,000 Units Oral Daily  . citalopram  20 mg Oral Daily  . digoxin  0.125 mg Oral Daily  . furosemide  80 mg Intravenous BID  . levothyroxine  88 mcg Oral QAC breakfast  . mouth rinse  15 mL Mouth Rinse BID  . metoprolol tartrate  50 mg Oral TID  . potassium chloride  40 mEq Oral TID  . rivaroxaban  20 mg Oral Q supper  . sodium chloride flush  3 mL Intravenous Q12H  . tiotropium  18 mcg Inhalation Daily  . vitamin B-12  100 mcg Oral Daily   Continuous Infusions:  PRN Meds: sodium chloride, acetaminophen, albuterol, ondansetron (ZOFRAN) IV, sodium chloride flush, temazepam   Vital Signs    Vitals:   09/22/16 1936 09/23/16 0339 09/23/16 0640 09/23/16 0749  BP: 109/67  (!) 94/57   Pulse: (!) 101  63   Resp: 18  20   Temp: 97.7 F (36.5 C)  97.7 F (36.5 C)   TempSrc: Oral  Oral   SpO2: 94%  93% 92%  Weight:  247 lb 8 oz (112.3 kg)    Height:        Intake/Output Summary (Last 24 hours) at 09/23/16 0806 Last data filed at 09/23/16 0339  Gross per 24 hour  Intake             1080 ml  Output             2400 ml  Net            -1320 ml   Filed Weights   09/21/16 0636 09/22/16 0642 09/23/16 0339  Weight: 247 lb 8 oz (112.3 kg) 248 lb 1.6 oz (112.5 kg) 247 lb 8 oz (112.3 kg)    Telemetry   atrial fib  - Personally Reviewed  ECG      Physical Exam  MOrbidly obese 76 yo in NAD   GEN: No acute distress.   Neck: Neck is full   Cardiac: RRR, no murmurs, rubs, or gallops.  Respiratory: Clear to auscultation bilaterally. GI: Soft, nontender, non-distended  Ext  Chronic stasis changes   Tr to 1+ edema   Neuro:  Nonfocal  Psych: Normal affect   Labs    Chemistry Recent Labs Lab 09/20/16 0331 09/22/16 0531  09/23/16 0412  NA 136 139 138  K 3.6 4.5 4.4  CL 89* 94* 94*  CO2 39* 38* 36*  GLUCOSE 114* 105* 92  BUN 27* 26* 25*  CREATININE 1.12* 1.00 0.97  CALCIUM 9.2 9.2 9.1  GFRNONAA 46* 53* 55*  GFRAA 53* >60 >60  ANIONGAP '8 7 8     '$ HematologyNo results for input(s): WBC, RBC, HGB, HCT, MCV, MCH, MCHC, RDW, PLT in the last 168 hours.  Cardiac Enzymes Recent Labs Lab 09/16/16 0953  TROPONINI <0.03   No results for input(s): TROPIPOC in the last 168 hours.   BNPNo results for input(s): BNP, PROBNP in the last 168 hours.   DDimer No results for input(s): DDIMER in the last 168 hours.   Radiology    No results found.  Cardiac Studies     Patient Profile   76 y.o. female w/ PMH of PAF (  on Xarelto), moderate pulmonary HTN, chronic diastolic CHF, lung cancer (treated with XRT and chemo), COPD (on home oxygen), OSA, PVD (s/p aorto bi-iliac stent grafting and tthoracic aneurysm), DVT (while on coumadin, currently on Xarelto) who presented to [facility] ED on 09/15/2016 for a 3 week history of progressive dyspnea.  Pt failled outpt Rx for     Assessment & Plan   1  Acute on chronic systolic CHF  diastolic CHF  LVEF 55 to 59% with Gr II diastolic CHF   Repeat echo LVEF35 to 40%  Poss tachy induced  WIll need f/u echo  Yesterday with walking her sats dropped to 70% on oxygen  Put back on IV lasix     WIll watch for Cr bump  2  Atrial fib  Continue rate control and anticoag  (Xarelto)   Signed, Dorris Carnes, MD  09/23/2016, 8:06 AM

## 2016-09-23 NOTE — Progress Notes (Signed)
Cardiology PA paged made aware of pts low BP ( see flowsheet), ordered to go ahead and give lasix, hold betablocker.

## 2016-09-23 NOTE — Progress Notes (Signed)
SATURATION QUALIFICATIONS: (This note is used to comply with regulatory documentation for home oxygen)  Patient Saturations on Room Air at Rest =89%  Patient Saturations on Room Air while Ambulating =74%  Patient Saturations on 3 Liters of oxygen while Ambulating =78%  Please briefly explain why patient needs home oxygen:

## 2016-09-23 NOTE — Care Management Important Message (Signed)
Important Message  Patient Details  Name: [name] MRN: [id number] Date of Birth: 10/12/1940   Medicare Important Message Given:  Yes    Madden Piazza [address] 09/23/2016, 2:15 PM

## 2016-09-24 LAB — BASIC METABOLIC PANEL
Anion gap: 8 (ref 5–15)
BUN: 23 mg/dL — ABNORMAL HIGH (ref 6–20)
CO2: 35 mmol/L — ABNORMAL HIGH (ref 22–32)
Calcium: 9 mg/dL (ref 8.9–10.3)
Chloride: 95 mmol/L — ABNORMAL LOW (ref 101–111)
Creatinine, Ser: 0.97 mg/dL (ref 0.44–1.00)
GFR calc Af Amer: >60 mL/min (ref >60)
GFR calc non Af Amer: 55 mL/min — ABNORMAL LOW (ref >60)
Glucose, Bld: 85 mg/dL (ref 65–99)
Potassium: 5.1 mmol/L (ref 3.5–5.1)
Sodium: 138 mmol/L (ref 135–145)

## 2016-09-24 MED ORDER — POTASSIUM CHLORIDE CRYS ER 20 MEQ PO TBCR: 40.0000 meq | EXTENDED_RELEASE_TABLET | Freq: Three times a day (TID) | ORAL | Status: DC

## 2016-09-24 NOTE — Progress Notes (Signed)
qPhysical Therapy Treatment Patient Details Name: BLONDELL LAPERLE MRN: [id number] DOB: 01-07-1941 Today's Date: 09/24/2016    History of Present Illness Pt is 76 yo with history of paroxysmal Afib, diastolic CHF, and COPD on home oxygen. She reports 3 week h/o increasing SOB with DOE and fatigue. Diagnosed with acute on chronic CHF.    PT Comments    Pt presented supine in bed with HOB elevated, awake and willing to participate in therapy session. Of note, pt with significant desat during ambulation (see details below). Pt would continue to benefit from skilled physical therapy services at this time while admitted and after d/c to address the below listed limitations in order to improve overall safety and independence with functional mobility.    Follow Up Recommendations  Home health PT;Supervision - Intermittent     Equipment Recommendations  None recommended by PT    Recommendations for Other Services       Precautions / Restrictions Precautions Precautions: Fall Restrictions Weight Bearing Restrictions: No    Mobility  Bed Mobility Overal bed mobility: Modified Independent                Transfers Overall transfer level: Needs assistance Equipment used: Rolling walker (2 wheeled) Transfers: Sit to/from Stand Sit to Stand: Supervision         General transfer comment: increased time, good technique, supervision for safety  Ambulation/Gait Ambulation/Gait assistance: Min guard Ambulation Distance (Feet): 100 Feet Assistive device: Rolling walker (2 wheeled) Gait Pattern/deviations: Step-through pattern Gait velocity: decreased Gait velocity interpretation: Below normal speed for age/gender General Gait Details: pt with mild instability with ambulation but no LOB or need for physical assistance. Pt with increased WOB and DOE at end of ambulation. Pt ambulated on 3L of O2 with SPO2 decreasing to as low as 79%, requiring a sitting rest break of several minutes  with focus on deep breathing to recover to 90%.    Stairs            Wheelchair Mobility    Modified Rankin (Stroke Patients Only)       Balance Overall balance assessment: Needs assistance Sitting-balance support: Feet supported;No upper extremity supported Sitting balance-Leahy Scale: Good     Standing balance support: During functional activity;No upper extremity supported Standing balance-Leahy Scale: Fair Standing balance comment: able to maintain static stance without external support with min guard                            Cognition Arousal/Alertness: Awake/alert Behavior During Therapy: WFL for tasks assessed/performed Overall Cognitive Status: Within Functional Limits for tasks assessed                                        Exercises      General Comments        Pertinent Vitals/Pain Pain Assessment: No/denies pain    Home Living                      Prior Function            PT Goals (current goals can now be found in the care plan section) Acute Rehab PT Goals PT Goal Formulation: With patient Time For Goal Achievement: 09/26/16 Potential to Achieve Goals: Good Progress towards PT goals: Progressing toward goals    Frequency    Min  3X/week      PT Plan Current plan remains appropriate    Co-evaluation             End of Session Equipment Utilized During Treatment: Gait belt;Oxygen Activity Tolerance: Patient limited by fatigue Patient left: in chair;with call bell/phone within reach;with chair alarm set Nurse Communication: Mobility status PT Visit Diagnosis: Muscle weakness (generalized) (M62.81);Other abnormalities of gait and mobility (R26.89)     Time: 2244-9753 PT Time Calculation (min) (ACUTE ONLY): 17 min  Charges:  $Gait Training: 8-22 mins                    G Codes:       [place], [place], [place] 09/24/2016, 3:31 PM

## 2016-09-24 NOTE — Progress Notes (Addendum)
Progress Note  Patient Name: [name] Date of Encounter: 09/24/2016  Primary Cardiologist: [place]  Subjective   Breathing is OK at reest  No CP    Inpatient Medications    Scheduled Meds: . atorvastatin  10 mg Oral q1800  . cholecalciferol  1,000 Units Oral Daily  . citalopram  20 mg Oral Daily  . digoxin  0.125 mg Oral Daily  . furosemide  80 mg Intravenous BID  . levothyroxine  88 mcg Oral QAC breakfast  . mouth rinse  15 mL Mouth Rinse BID  . metoprolol tartrate  50 mg Oral TID  . potassium chloride  40 mEq Oral TID  . rivaroxaban  20 mg Oral Q supper  . sodium chloride flush  3 mL Intravenous Q12H  . tiotropium  18 mcg Inhalation Daily  . vitamin B-12  100 mcg Oral Daily   Continuous Infusions:  PRN Meds: sodium chloride, acetaminophen, albuterol, ondansetron (ZOFRAN) IV, sodium chloride flush, temazepam   Vital Signs    Vitals:   09/23/16 1900 09/23/16 2125 09/24/16 0414 09/24/16 0838  BP: (!) 84/57 108/64 97/72   Pulse: 61 92 (!) 104   Resp:  18 20   Temp: (!) 87.5 F (30.8 C) 98.2 F (36.8 C) 97.5 F (36.4 C)   TempSrc: Oral Oral Oral   SpO2: 94% 93% 93% 95%  Weight:   248 lb 3.2 oz (112.6 kg)   Height:        Intake/Output Summary (Last 24 hours) at 09/24/16 1003 Last data filed at 09/24/16 0929  Gross per 24 hour  Intake             1060 ml  Output             3120 ml  Net            -2060 ml   Net neg ?14L     Filed Weights   09/22/16 8250 09/23/16 0339 09/24/16 0414  Weight: 248 lb 1.6 oz (112.5 kg) 247 lb 8 oz (112.3 kg) 248 lb 3.2 oz (112.6 kg)    Telemetry    Atrial fib   - Personally Reviewed  ECG      Physical Exam   Morbidly obese 76 yo   GEN: No acute distress.   Neck: No obvious  JVD Cardiac: RRR, no murmurs, rubs, or gallops.  Respiratory: Clear to auscultation bilaterally. GI: Soft, nontender, non-distended  MS: Chronic stasis changes  Tr edema No deformity. Neuro:  Nonfocal  Psych: Normal affect   Labs    Chemistry Recent Labs Lab 09/22/16 0531 09/23/16 0412 09/24/16 0530  NA 139 138 138  K 4.5 4.4 5.1  CL 94* 94* 95*  CO2 38* 36* 35*  GLUCOSE 105* 92 85  BUN 26* 25* 23*  CREATININE 1.00 0.97 0.97  CALCIUM 9.2 9.1 9.0  GFRNONAA 53* 55* 55*  GFRAA >60 >60 >60  ANIONGAP '7 8 8     '$ HematologyNo results for input(s): WBC, RBC, HGB, HCT, MCV, MCH, MCHC, RDW, PLT in the last 168 hours.  Cardiac EnzymesNo results for input(s): TROPONINI in the last 168 hours. No results for input(s): TROPIPOC in the last 168 hours.   BNPNo results for input(s): BNP, PROBNP in the last 168 hours.   DDimer No results for input(s): DDIMER in the last 168 hours.   Radiology    No results found.  Cardiac Studies     Patient Profile     76  y.o. female w/ PMH of PAF (on Xarelto), moderate pulmonary HTN, chronic diastolic CHF, lung cancer (treated with XRT and chemo), COPD (on home oxygen), OSA, PVD (s/p aorto bi-iliac stent grafting and tthoracic aneurysm), DVT (while on coumadin, currently on Xarelto) who presented to [facility] ED on 09/15/2016 for a 3 week history of progressive dyspnea.  Pt failled outpt Rx for CHF    Assessment & Plan    1  Acute on chornic diastolic CHF  Volume continues to improve  Cr stable  I would keep on IV diuresis  Ambulate on O2 and follow sats  She is currently on 5 L  She wears 2 L at home , does not check sats at home Will have dietary see pt re education    2  Atrial fib  Rate control and anticoagulation    3  HTN  Denies dizzness    4    Signed, Dorris Carnes, MD  09/24/2016, 10:03 AM

## 2016-09-25 LAB — BASIC METABOLIC PANEL
Anion gap: 9 (ref 5–15)
BUN: 21 mg/dL — ABNORMAL HIGH (ref 6–20)
CO2: 35 mmol/L — ABNORMAL HIGH (ref 22–32)
Calcium: 9 mg/dL (ref 8.9–10.3)
Chloride: 93 mmol/L — ABNORMAL LOW (ref 101–111)
Creatinine, Ser: 1 mg/dL (ref 0.44–1.00)
GFR calc Af Amer: >60 mL/min (ref >60)
GFR calc non Af Amer: 53 mL/min — ABNORMAL LOW (ref >60)
Glucose, Bld: 122 mg/dL — ABNORMAL HIGH (ref 65–99)
Potassium: 3.8 mmol/L (ref 3.5–5.1)
Sodium: 137 mmol/L (ref 135–145)

## 2016-09-25 MED ORDER — POTASSIUM CHLORIDE CRYS ER 20 MEQ PO TBCR
20.0000 meq | EXTENDED_RELEASE_TABLET | Freq: Two times a day (BID) | ORAL | Status: DC
  Administered 2016-09-25 – 2016-09-27 (×6): 20 meq via ORAL
  Filled 2016-09-25 (×6): qty 1

## 2016-09-25 MED ORDER — FUROSEMIDE 10 MG/ML IJ SOLN
80.0000 mg | Freq: Three times a day (TID) | INTRAMUSCULAR | Status: DC
  Administered 2016-09-25 – 2016-09-26 (×5): 80 mg via INTRAVENOUS
  Filled 2016-09-25 (×5): qty 8

## 2016-09-25 NOTE — Progress Notes (Signed)
qPhysical Therapy Treatment Patient Details Name: [name] MRN: [id number] DOB: Jun 13, 1941 Today's Date: 09/25/2016    History of Present Illness Pt is 76 yo with history of paroxysmal Afib, diastolic CHF, and COPD on home oxygen. She reports 3 week h/o increasing SOB with DOE and fatigue. Diagnosed with acute on chronic CHF.    PT Comments    Pt making good progress with mobility. She continues to require supplemental O2 (4L) with activity. Pt with desat to as low as 83% this session, requiring sitting rest break of several minutes with focus on deep breathing to increase to 90%. PT will continue to follow acutely to ensure a safe d/c home.    Follow Up Recommendations  Home health PT;Supervision - Intermittent     Equipment Recommendations  None recommended by PT    Recommendations for Other Services       Precautions / Restrictions Precautions Precautions: Fall Restrictions Weight Bearing Restrictions: No    Mobility  Bed Mobility Overal bed mobility: Modified Independent                Transfers Overall transfer level: Needs assistance Equipment used: Rolling walker (2 wheeled) Transfers: Sit to/from Stand Sit to Stand: Supervision         General transfer comment: increased time, good technique, supervision for safety  Ambulation/Gait Ambulation/Gait assistance: Min guard Ambulation Distance (Feet): 75 Feet (75' x2 with sitting rest break) Assistive device: Rolling walker (2 wheeled) Gait Pattern/deviations: Step-through pattern Gait velocity: decreased Gait velocity interpretation: Below normal speed for age/gender General Gait Details: pt with mild instability with ambulation but no LOB or need for physical assistance. Pt ambulated on 4L of O2 with SPO2 decreasing to as low as 83% with slow recovery, requiring a sitting rest break of several minutes with focus on deep breathing   Stairs            Wheelchair Mobility    Modified Rankin  (Stroke Patients Only)       Balance Overall balance assessment: Needs assistance Sitting-balance support: Feet supported;No upper extremity supported Sitting balance-Leahy Scale: Good     Standing balance support: During functional activity;No upper extremity supported Standing balance-Leahy Scale: Fair                              Cognition Arousal/Alertness: Awake/alert Behavior During Therapy: WFL for tasks assessed/performed Overall Cognitive Status: Within Functional Limits for tasks assessed                                        Exercises      General Comments        Pertinent Vitals/Pain Pain Assessment: No/denies pain    Home Living                      Prior Function            PT Goals (current goals can now be found in the care plan section) Acute Rehab PT Goals PT Goal Formulation: With patient Time For Goal Achievement: 09/26/16 Potential to Achieve Goals: Good Progress towards PT goals: Progressing toward goals    Frequency    Min 3X/week      PT Plan Current plan remains appropriate    Co-evaluation  End of Session Equipment Utilized During Treatment: Gait belt;Oxygen Activity Tolerance: Patient limited by fatigue Patient left: in bed;with call bell/phone within reach (pt refused bed alarm) Nurse Communication: Mobility status PT Visit Diagnosis: Muscle weakness (generalized) (M62.81);Other abnormalities of gait and mobility (R26.89)     Time: 4709-2957 PT Time Calculation (min) (ACUTE ONLY): 19 min  Charges:  $Gait Training: 8-22 mins                    G Codes:       [place], [place], [place] 09/25/2016, 1:31 PM

## 2016-09-25 NOTE — Progress Notes (Signed)
Progress Note  Patient Name: [name] Date of Encounter: 09/25/2016  Primary Cardiologist: [place]    Subjective   No CP  Breathing is OK  Sats on 3L with walking yesterday decreased to 79%  Inpatient Medications    Scheduled Meds: . atorvastatin  10 mg Oral q1800  . cholecalciferol  1,000 Units Oral Daily  . citalopram  20 mg Oral Daily  . digoxin  0.125 mg Oral Daily  . furosemide  80 mg Intravenous TID  . levothyroxine  88 mcg Oral QAC breakfast  . mouth rinse  15 mL Mouth Rinse BID  . metoprolol tartrate  50 mg Oral TID  . potassium chloride  20 mEq Oral BID  . potassium chloride  40 mEq Oral TID  . rivaroxaban  20 mg Oral Q supper  . sodium chloride flush  3 mL Intravenous Q12H  . tiotropium  18 mcg Inhalation Daily  . vitamin B-12  100 mcg Oral Daily   Continuous Infusions:  PRN Meds: sodium chloride, acetaminophen, albuterol, ondansetron (ZOFRAN) IV, sodium chloride flush, temazepam   Vital Signs    Vitals:   09/24/16 2003 09/25/16 0557 09/25/16 0805 09/25/16 0835  BP: 111/70 95/69 103/61   Pulse: 96 61 79   Resp: 20 20    Temp: 97.4 F (36.3 C) 98 F (36.7 C)    TempSrc: Oral Oral    SpO2: 94% 92%  94%  Weight:  247 lb 8 oz (112.3 kg)    Height:        Intake/Output Summary (Last 24 hours) at 09/25/16 0909 Last data filed at 09/25/16 0641  Gross per 24 hour  Intake              960 ml  Output             2400 ml  Net            -1440 ml   Filed Weights   09/23/16 0339 09/24/16 0414 09/25/16 0557  Weight: 247 lb 8 oz (112.3 kg) 248 lb 3.2 oz (112.6 kg) 247 lb 8 oz (112.3 kg)    Telemetry    Afib  Rates on average in 90s   - Personally Reviewed  ECG      Physical Exam  Morbidly obese 76 yo GEN: No acute distress.   Neck: JVP is increased  Cardiac:  Irreg irreg  , no murmurs, rubs, or gallops.  Respiratory: Clear to auscultation bilaterally. GI: Soft, nontender, non-distended  MS: Chronic stasis changes to skin  Tr to 1+ edema; No  deformity. Neuro:  Nonfocal  Psych: Normal affect   Labs    Chemistry Recent Labs Lab 09/23/16 0412 09/24/16 0530 09/25/16 0349  NA 138 138 137  K 4.4 5.1 3.8  CL 94* 95* 93*  CO2 36* 35* 35*  GLUCOSE 92 85 122*  BUN 25* 23* 21*  CREATININE 0.97 0.97 1.00  CALCIUM 9.1 9.0 9.0  GFRNONAA 55* 55* 53*  GFRAA >60 >60 >60  ANIONGAP '8 8 9     '$ HematologyNo results for input(s): WBC, RBC, HGB, HCT, MCV, MCH, MCHC, RDW, PLT in the last 168 hours.  Cardiac EnzymesNo results for input(s): TROPONINI in the last 168 hours. No results for input(s): TROPIPOC in the last 168 hours.   BNPNo results for input(s): BNP, PROBNP in the last 168 hours.   DDimer No results for input(s): DDIMER in the last 168 hours.   Radiology    No  results found.  Cardiac Studies     Patient Profile     76 y.o. female  With history of persistent atrial fib, mod pulmonary HTN, morbid obesity, lung CA, COPD , OSA, PVOD and DVT  Being treated for acute on chronic diastolic CHF   Assessment & Plan    1  Acute on chronic diastolic CHF  Volume is still increased   She continues to have net negative numbers  Cr has not bumped  I will increase lasix frequency to tid   Add bid KCL F/U with ambulatory sats  2  Atrail fib  Continue rate control and Xarelto  3  Hx DVT  ON Xarelto    Signed, Dorris Carnes, MD  09/25/2016, 9:09 AM

## 2016-09-25 NOTE — Progress Notes (Signed)
Nutrition Education Note  RD consulted for nutrition education regarding CHF.  RD provided "Low Sodium Nutrition Therapy" handout from the [employer]. Reviewed patient's dietary recall. Provided examples on ways to decrease sodium intake in diet. Discouraged intake of processed foods and use of salt shaker. Encouraged fresh fruits and vegetables as well as whole grain sources of carbohydrates to maximize fiber intake.   RD discussed why it is important for patient to adhere to diet recommendations, and emphasized the role of fluids, foods to avoid, and importance of weighing self daily. Teach back method used.  Expect fair compliance.  Body mass index is 45.27 kg/m. Pt meets criteria for morbidly obese based on current BMI.  Current diet order is 2gm sodium restriction, patient is consuming approximately 100% of meals at this time. Labs and medications reviewed. No further nutrition interventions warranted at this time. RD contact information provided. If additional nutrition issues arise, please re-consult RD.   Koleen Distance, RD, LDN Pager #- [phone]

## 2016-09-26 LAB — BASIC METABOLIC PANEL
Anion gap: 8 (ref 5–15)
BUN: 20 mg/dL (ref 6–20)
CO2: 38 mmol/L — ABNORMAL HIGH (ref 22–32)
Calcium: 8.6 mg/dL — ABNORMAL LOW (ref 8.9–10.3)
Chloride: 92 mmol/L — ABNORMAL LOW (ref 101–111)
Creatinine, Ser: 1.04 mg/dL — ABNORMAL HIGH (ref 0.44–1.00)
GFR calc Af Amer: 59 mL/min — ABNORMAL LOW (ref >60)
GFR calc non Af Amer: 50 mL/min — ABNORMAL LOW (ref >60)
Glucose, Bld: 98 mg/dL (ref 65–99)
Potassium: 3.5 mmol/L (ref 3.5–5.1)
Sodium: 138 mmol/L (ref 135–145)

## 2016-09-26 LAB — CBC
HCT: 49.1 % — ABNORMAL HIGH (ref 36.0–46.0)
Hemoglobin: 16 g/dL — ABNORMAL HIGH (ref 12.0–15.0)
MCH: 35.6 pg — ABNORMAL HIGH (ref 26.0–34.0)
MCHC: 32.6 g/dL (ref 30.0–36.0)
MCV: 109.1 fL — ABNORMAL HIGH (ref 78.0–100.0)
Platelets: 128 10*3/uL — ABNORMAL LOW (ref 150–400)
RBC: 4.5 MIL/uL (ref 3.87–5.11)
RDW: 12.9 % (ref 11.5–15.5)
WBC: 6.1 10*3/uL (ref 4.0–10.5)

## 2016-09-26 LAB — DIGOXIN LEVEL: Digoxin Level: 0.6 ng/mL — ABNORMAL LOW (ref 0.8–2.0)

## 2016-09-26 MED ORDER — METOLAZONE 2.5 MG PO TABS
2.5000 mg | ORAL_TABLET | Freq: Once | ORAL | Status: AC
  Administered 2016-09-26: 2.5 mg via ORAL
  Filled 2016-09-26: qty 1

## 2016-09-26 NOTE — Progress Notes (Signed)
Progress Note  Patient Name: [name] Date of Encounter: 09/26/2016  Primary Cardiologist: [place]  Subjective   No CP  Breathing is fair    Inpatient Medications    Scheduled Meds: . atorvastatin  10 mg Oral q1800  . cholecalciferol  1,000 Units Oral Daily  . citalopram  20 mg Oral Daily  . digoxin  0.125 mg Oral Daily  . furosemide  80 mg Intravenous TID  . levothyroxine  88 mcg Oral QAC breakfast  . mouth rinse  15 mL Mouth Rinse BID  . metoprolol tartrate  50 mg Oral TID  . potassium chloride  20 mEq Oral BID  . rivaroxaban  20 mg Oral Q supper  . sodium chloride flush  3 mL Intravenous Q12H  . tiotropium  18 mcg Inhalation Daily  . vitamin B-12  100 mcg Oral Daily   Continuous Infusions:  PRN Meds: sodium chloride, acetaminophen, albuterol, ondansetron (ZOFRAN) IV, sodium chloride flush, temazepam   Vital Signs    Vitals:   09/25/16 1805 09/25/16 2046 09/25/16 2154 09/26/16 0507  BP: 95/66 107/71 107/81 (!) 99/52  Pulse: 83 (!) 59 74 67  Resp:  '20 20 18  '$ Temp:  97.9 F (36.6 C)  97.7 F (36.5 C)  TempSrc:  Oral  Oral  SpO2:  93% 95% 93%  Weight:    247 lb 3.2 oz (112.1 kg)  Height:        Intake/Output Summary (Last 24 hours) at 09/26/16 0755 Last data filed at 09/26/16 0555  Gross per 24 hour  Intake              992 ml  Output             1800 ml  Net             -808 ml   Net I/O  16.5 L negative   Filed Weights   09/24/16 0414 09/25/16 0557 09/26/16 0507  Weight: 248 lb 3.2 oz (112.6 kg) 247 lb 8 oz (112.3 kg) 247 lb 3.2 oz (112.1 kg)    Telemetry    Afib  90s   - Personally Reviewed  ECG    Physical Exam  Morbidly obese 76 yo   GEN: No acute distress.   Neck: Neck is full Cardiac: Irreg Irreg  no murmurs, rubs, or gallops.  Respiratory: Clear to auscultation bilaterally.  Rales at L base   GI: Soft, nontender, non-distended  MS: Tr to 1+edema; No deformity.  Chronic stasis changes of skin Neuro:  Nonfocal  Psych: Normal  affect   Labs    Chemistry Recent Labs Lab 09/24/16 0530 09/25/16 0349 09/26/16 0434  NA 138 137 138  K 5.1 3.8 3.5  CL 95* 93* 92*  CO2 35* 35* 38*  GLUCOSE 85 122* 98  BUN 23* 21* 20  CREATININE 0.97 1.00 1.04*  CALCIUM 9.0 9.0 8.6*  GFRNONAA 55* 53* 50*  GFRAA >60 >60 59*  ANIONGAP '8 9 8     '$ HematologyNo results for input(s): WBC, RBC, HGB, HCT, MCV, MCH, MCHC, RDW, PLT in the last 168 hours.  Cardiac EnzymesNo results for input(s): TROPONINI in the last 168 hours. No results for input(s): TROPIPOC in the last 168 hours.   BNPNo results for input(s): BNP, PROBNP in the last 168 hours.   DDimer No results for input(s): DDIMER in the last 168 hours.   Radiology    No results found.  Cardiac Studies  Patient Profile     76 y.o. female with persistent atrial fib, mod pulm HTN, morbid obesity, lung CA, COPD, OSA, DVT  And diastolic CHF    Assessment & Plan    1  Acute on chronic diastolic CHF  COntinue sti diuresis  Over 16 L negative  WIll give Zaroxylyn today  Switch to po tomorrow  She was on 40 lasix as outpt  Will go higher  sats better with ambulation (83%)   Still not great and she is on 4 L (had used 2 at home) Reviewed with her low NA diet, reviewed daily weights  2  Atrial fib  Xarelto and rate control  3  DVT  Xarelto    Probable d/c tomorrowor Sunday   Signed, Dorris Carnes, MD  09/26/2016, 7:55 AM

## 2016-09-26 NOTE — Progress Notes (Signed)
Patient with no complaints or concerns during 7pm - 7am shift. Slept during the night.   Zelpha Messing, RN 

## 2016-09-27 DIAGNOSIS — E782 Mixed hyperlipidemia: Secondary | ICD-10-CM

## 2016-09-27 DIAGNOSIS — E876 Hypokalemia: Secondary | ICD-10-CM

## 2016-09-27 LAB — BASIC METABOLIC PANEL
Anion gap: 12 (ref 5–15)
BUN: 22 mg/dL — ABNORMAL HIGH (ref 6–20)
CO2: 41 mmol/L — ABNORMAL HIGH (ref 22–32)
Calcium: 9.1 mg/dL (ref 8.9–10.3)
Chloride: 85 mmol/L — ABNORMAL LOW (ref 101–111)
Creatinine, Ser: 1.04 mg/dL — ABNORMAL HIGH (ref 0.44–1.00)
GFR calc Af Amer: 59 mL/min — ABNORMAL LOW (ref >60)
GFR calc non Af Amer: 50 mL/min — ABNORMAL LOW (ref >60)
Glucose, Bld: 97 mg/dL (ref 65–99)
Potassium: 2.8 mmol/L — ABNORMAL LOW (ref 3.5–5.1)
Sodium: 138 mmol/L (ref 135–145)

## 2016-09-27 MED ORDER — POTASSIUM CHLORIDE CRYS ER 10 MEQ PO TBCR
60.0000 meq | EXTENDED_RELEASE_TABLET | Freq: Once | ORAL | Status: AC
  Administered 2016-09-27: 60 meq via ORAL
  Filled 2016-09-27: qty 6

## 2016-09-27 MED ORDER — POTASSIUM CHLORIDE CRYS ER 20 MEQ PO TBCR
40.0000 meq | EXTENDED_RELEASE_TABLET | Freq: Once | ORAL | Status: AC
  Administered 2016-09-27: 40 meq via ORAL
  Filled 2016-09-27: qty 2

## 2016-09-27 MED ORDER — FUROSEMIDE 80 MG PO TABS
80.0000 mg | ORAL_TABLET | Freq: Every day | ORAL | Status: DC
  Administered 2016-09-27: 80 mg via ORAL
  Filled 2016-09-27: qty 1

## 2016-09-27 NOTE — Progress Notes (Addendum)
CRITICAL VALUE ALERT  Critical value received: Potassium  2.8  Date of notification:  09/27/2016  Time of notification:  5:40  Critical value read back:No.  Nurse who received alert:  Nurse was not notified   MD notified (1st page):  Eula Fried /Cardiology   Time of first page:  4:40   MD notified (2nd page):  Time of second page:  Responding MD:    Time MD responded:    PO Potassium ordered. Will continue to monitor.   Gredmarie Delange, RN

## 2016-09-27 NOTE — Progress Notes (Addendum)
Progress Note  Patient Name: [name] Date of Encounter: 09/27/2016  Primary Cardiologist: [place]  Subjective   The patient feels better today.  Inpatient Medications    Scheduled Meds: . atorvastatin  10 mg Oral q1800  . cholecalciferol  1,000 Units Oral Daily  . citalopram  20 mg Oral Daily  . digoxin  0.125 mg Oral Daily  . furosemide  80 mg Intravenous TID  . levothyroxine  88 mcg Oral QAC breakfast  . mouth rinse  15 mL Mouth Rinse BID  . metoprolol tartrate  50 mg Oral TID  . potassium chloride  20 mEq Oral BID  . rivaroxaban  20 mg Oral Q supper  . sodium chloride flush  3 mL Intravenous Q12H  . tiotropium  18 mcg Inhalation Daily  . vitamin B-12  100 mcg Oral Daily   Continuous Infusions:  PRN Meds: sodium chloride, acetaminophen, albuterol, ondansetron (ZOFRAN) IV, sodium chloride flush, temazepam   Vital Signs    Vitals:   09/26/16 1535 09/26/16 1900 09/26/16 2100 09/27/16 0500  BP: 113/78 113/65 111/81 103/68  Pulse: (!) 104 75 90 86  Resp:  20  20  Temp:  98.5 F (36.9 C)  98.4 F (36.9 C)  TempSrc:  Oral  Oral  SpO2:  95%  93%  Weight:    242 lb 3.2 oz (109.9 kg)  Height:        Intake/Output Summary (Last 24 hours) at 09/27/16 0930 Last data filed at 09/27/16 0926  Gross per 24 hour  Intake              650 ml  Output             4150 ml  Net            -3500 ml   Net I/O  16.5 L negative   Filed Weights   09/25/16 0557 09/26/16 0507 09/27/16 0500  Weight: 247 lb 8 oz (112.3 kg) 247 lb 3.2 oz (112.1 kg) 242 lb 3.2 oz (109.9 kg)    Telemetry    Afib  90-100s   - Personally Reviewed  ECG    Physical Exam  Morbidly obese 76 yo   GEN: No acute distress.   Neck: Neck is full Cardiac: Irreg Irreg  no murmurs, rubs, or gallops.  Respiratory: Clear to auscultation bilaterally.  CTA   GI: Soft, nontender, non-distended  MS: Tr edema; No deformity.  Chronic stasis changes of skin Neuro:  Nonfocal  Psych: Normal affect   Labs      Chemistry  Recent Labs Lab 09/25/16 0349 09/26/16 0434 09/27/16 0332  NA 137 138 138  K 3.8 3.5 2.8*  CL 93* 92* 85*  CO2 35* 38* 41*  GLUCOSE 122* 98 97  BUN 21* 20 22*  CREATININE 1.00 1.04* 1.04*  CALCIUM 9.0 8.6* 9.1  GFRNONAA 53* 50* 50*  GFRAA >60 59* 59*  ANIONGAP '9 8 12    '$ Hematology  Recent Labs Lab 09/26/16 0953  WBC 6.1  RBC 4.50  HGB 16.0*  HCT 49.1*  MCV 109.1*  MCH 35.6*  MCHC 32.6  RDW 12.9  PLT 128*    Cardiac EnzymesNo results for input(s): TROPONINI in the last 168 hours. No results for input(s): TROPIPOC in the last 168 hours.   BNPNo results for input(s): BNP, PROBNP in the last 168 hours.   DDimer No results for input(s): DDIMER in the last 168 hours.   Radiology  No results found.  Cardiac Studies     Patient Profile     76 y.o. female with persistent atrial fib, mod pulm HTN, morbid obesity, lung CA, COPD, OSA, DVT  And diastolic CHF    Assessment & Plan    1  Acute on chronic diastolic CHF   - diuresed overall 19 L, 3.5 L overnight with improved symptoms.  - switch to PO diuresis laix 80 mg po BID and KCl 20 mg PO BID - Crea 1.0, K low, we will replace  - O2 sats improved to 93-95%  2  Atrial fib  Xarelto and rate control, HR higher overnight most probably sec to fast diuresis  3  DVT  Xarelto    4. Hypokalemia - continue KCl 20 mEq BID, give additional 40 this am (total 60 mEq this am)  Discharge tomorrow once we see how she tolerates po lasix and hypokalemia is corrected, follow up within 5-7 days with labs - BMP  Signed, Ena Dawley, MD  09/27/2016, 9:30 AM

## 2016-09-27 NOTE — Discharge Summary (Signed)
Discharge Summary    Patient ID: [name],  MRN: [id number], DOB/AGE: 1940-08-24 76 y.o.  Admit date: 09/15/2016 Discharge date: 09/28/2016  Primary Care Provider: ARONSON,RICHARD A Primary Cardiologist: Dr. Martinique  Discharge Diagnoses    Principal Problem:   Acute on chronic combined systolic and diastolic CHF (congestive heart failure) ([place]) Active Problems:   Hypothyroid   HTN (hypertension)   AAA (abdominal aortic aneurysm) (HCC)   A-fib (HCC)   Chronic respiratory failure with hypoxia (HCC)   Hyperlipidemia   Hypokalemia   Allergies No Known Allergies  Diagnostic Studies/Procedures    ECHO: 09/17/16 Study Conclusions  - Left ventricle: The cavity size was normal. Wall thickness was   increased in a pattern of moderate LVH. Systolic function was   moderately reduced. The estimated ejection fraction was in the   range of 35% to 40%. Diffuse hypokinesis. The study is not   technically sufficient to allow evaluation of LV diastolic   function. - Aortic valve: Trileaflet. Sclerosis without stenosis. There was   no regurgitation. - Mitral valve: Calcified annulus. Mildly thickened leaflets . - Left atrium: The atrium was mildly dilated. - Right ventricle: The cavity size was normal. Mildly decreased RV   systolic function. - Right atrium: Moderately dilated. - Tricuspid valve: There was moderate regurgitation. - Pulmonary arteries: PA peak pressure: 49 mm Hg (S). - Inferior vena cava: The vessel was dilated. The respirophasic   diameter changes were blunted (< 50%), consistent with elevated   central venous pressure.  Impressions:  - Compared to a prior study in 2016, the LVEF is reduced now to   35-40%. A-fib wtih RVR is noted. The ascending aorta meausres 4.0   cm. There is mild AI, mild LAE, moderate RAE, moderate TR and   RVSP of 49 mmHg with a dilated IVC. _____________   History of Present Illness     80 yof with hx of PAF< diastolic CHF,  COPD on home oxygen.  Prior to admit of 09/15/16 for about 3 weeks, + SOB, with DOE and fatigue, no chest pain.  She was seen by PCP and found to be in a fib.  BB increased and IM lasix given.  She improved for a brief period but then symptoms returned.  Lasix increased without improvement.  Wt was up about 20 lbs.  Past nucs have been neg.  Echo in 2016 with mild LVH and normal EF, G2DD.  + moderate PH related to chronic COPD.  Has had lung cancer treated with XRT and chemo. Other issues include OSA, past DVT, paroxysmal atrial flutter. She has had prior aorto bi-iliac stent grafting per Dr. Donnetta Hutching. Has a known thoracic aneurysm - last measured in March of 2017 and is 4.5 x 3.6 x 2.8 - continuing to be followed by Dr. Donnetta Hutching. Had her DVT while on coumadin. Currently on Xarelto. Has had issues with beta blocker and bradycardia in the past - and only able to tolerate low dose.  Pt was admitted for acute on chronic diastolic HF for diuresis and continue rate control of her a fib.   Mali Vasc score of 6.   Amiodarone contraindicated with severe COPD on oxygen. Tikosyn and Sotalol contraindicated with prolonged QT. Multaq and flecainide are not good choices with CHF.   Hospital Course     Consultants: none   Pt diuresed a total of negative 11,464 and wt from 259 Lbs to 241 LBS.  Her a fib was averaging in the  90s, she felt much improved.  Dietitian has seen and discussed low salt diet.   Her K+ was low prior to discharge this was replaced numerous times and was up to 4.0 prior to discharge.  She was seen and evaluated by Dr. Meda Coffee and found stable for discharge.  She will be a TOC at end of this week and recheck labs.   Cozaar has been held due to low BP  May be able to resume depending on labs.  _____________  Discharge Vitals Blood pressure 108/66, pulse 74, temperature 97.7 F (36.5 C), temperature source Oral, resp. rate 18, height '5\' 2"'$  (1.575 m), weight 241 lb (109.3 kg), SpO2 93 %.  Filed Weights    09/26/16 0507 09/27/16 0500 09/28/16 0500  Weight: 247 lb 3.2 oz (112.1 kg) 242 lb 3.2 oz (109.9 kg) 241 lb (109.3 kg)    Labs & Radiologic Studies    CBC  Recent Labs  09/26/16 0953  WBC 6.1  HGB 16.0*  HCT 49.1*  MCV 109.1*  PLT 062*   Basic Metabolic Panel  Recent Labs  09/27/16 0332 09/28/16 1047  NA 138 137  K 2.8* 4.0  CL 85* 92*  CO2 41* 38*  GLUCOSE 97 176*  BUN 22* 21*  CREATININE 1.04* 1.00  CALCIUM 9.1 9.1  MG  --  2.0   Liver Function Tests No results for input(s): AST, ALT, ALKPHOS, BILITOT, PROT, ALBUMIN in the last 72 hours. No results for input(s): LIPASE, AMYLASE in the last 72 hours. Cardiac Enzymes No results for input(s): CKTOTAL, CKMB, CKMBINDEX, TROPONINI in the last 72 hours. BNP Invalid input(s): POCBNP D-Dimer No results for input(s): DDIMER in the last 72 hours. Hemoglobin A1C No results for input(s): HGBA1C in the last 72 hours. Fasting Lipid Panel No results for input(s): CHOL, HDL, LDLCALC, TRIG, CHOLHDL, LDLDIRECT in the last 72 hours. Thyroid Function Tests No results for input(s): TSH, T4TOTAL, T3FREE, THYROIDAB in the last 72 hours.  Invalid input(s): FREET3   Dig level 0.6  _____________  Dg Chest 2 View  Result Date: 09/15/2016 CLINICAL DATA:  Shortness breath.  Lung cancer. EXAM: CHEST  2 VIEW COMPARISON:  CT a chest 03/11/2016. FINDINGS: The heart is enlarged. Diffuse interstitial pattern is worse at the lung bases. There is no significant consolidation. Atherosclerotic changes are present at the aortic arch. Degenerative changes are present at both shoulders. IMPRESSION: 1. Cardiomegaly with a diffuse interstitial pattern suggesting edema and congestive heart failure. Infection is considered less likely. 2. Aortic atherosclerosis. Electronically Signed   By: [place] More[place] M.D.   On: 09/15/2016 14:14   Disposition   Pt is being discharged home today in good condition.  Follow-up Plans & Appointments     Follow-up Information    Peter Martinique, MD Follow up.   Specialty:  Cardiology Why:  the office will call with date and time Contact information: [address] [place] [address] [phone]            Discharge Medications   Current Discharge Medication List    START taking these medications   Details  atorvastatin (LIPITOR) 10 MG tablet Take 1 tablet (10 mg total) by mouth daily at 6 PM. Qty: 30 tablet, Refills: 6    digoxin (LANOXIN) 0.125 MG tablet Take 1 tablet (0.125 mg total) by mouth daily. Qty: 30 tablet, Refills: 6    potassium chloride (K-DUR) 10 MEQ tablet Take 2 tablets (20 mEq total) by mouth 2 (two) times daily.  Qty: 120 tablet, Refills: 6      CONTINUE these medications which have CHANGED   Details  furosemide (LASIX) 40 MG tablet Take 2 tablets (80 mg total) by mouth 2 (two) times daily. Qty: 360 tablet, Refills: 2    metoprolol (LOPRESSOR) 50 MG tablet Take 1 tablet (50 mg total) by mouth 3 (three) times daily. Qty: 90 tablet, Refills: 6      CONTINUE these medications which have NOT CHANGED   Details  CALCIUM-MAGNESIUM-ZINC PO Take 1 tablet by mouth daily.     Cholecalciferol (VITAMIN D) 1000 UNITS capsule Take 1,000 Units by mouth daily.      citalopram (CELEXA) 20 MG tablet Take 20 mg by mouth daily.     levothyroxine (SYNTHROID, LEVOTHROID) 88 MCG tablet Take 88 mcg by mouth daily.    NON FORMULARY daily. 2 liters oxygen    OXYGEN Inhale 2 L into the lungs daily.    rivaroxaban (XARELTO) 20 MG TABS tablet Take 1 tablet (20 mg total) by mouth daily with supper. Qty: 30 tablet, Refills: 5    SPIRIVA HANDIHALER 18 MCG inhalation capsule PLACE 1 CAPSULE INTO INHALER AND INHALE ONCE DAILY AS DIRECTED Qty: 30 capsule, Refills: 6    temazepam (RESTORIL) 15 MG capsule TAKE 1 CAPSULE AT BEDTIME AS NEEDED  FOR  SLEEP Qty: 90 capsule, Refills: 1    VENTOLIN HFA 108 (90 Base) MCG/ACT inhaler TAKE 2 PUFFS EVERY 6 HOURS AS  NEEDED FORSHORTNESS OF BREATH/WHEEZING Qty: 18 g, Refills: 4    vitamin B-12 (CYANOCOBALAMIN) 100 MCG tablet Take 100 mcg by mouth daily.      STOP taking these medications     CARTIA XT 120 MG 24 hr capsule      losartan (COZAAR) 50 MG tablet            Outstanding Labs/Studies   BMP to check for hypokalemia on day of visit.   Duration of Discharge Encounter   Greater than 30 minutes including physician time.  Signed, Cecilie Kicks NP 09/28/2016, 12:20 PM

## 2016-09-27 NOTE — Progress Notes (Signed)
Pt requesting to see case manger for assistance with meals and other home services pt stated.  Attempted calling case manager no answer.  Will inform incoming nurse to f/u.  Karie Kirks, Therapist, sports.

## 2016-09-28 LAB — BASIC METABOLIC PANEL
Anion gap: 7 (ref 5–15)
BUN: 21 mg/dL — ABNORMAL HIGH (ref 6–20)
CO2: 38 mmol/L — ABNORMAL HIGH (ref 22–32)
Calcium: 9.1 mg/dL (ref 8.9–10.3)
Chloride: 92 mmol/L — ABNORMAL LOW (ref 101–111)
Creatinine, Ser: 1 mg/dL (ref 0.44–1.00)
GFR calc Af Amer: >60 mL/min (ref >60)
GFR calc non Af Amer: 53 mL/min — ABNORMAL LOW (ref >60)
Glucose, Bld: 176 mg/dL — ABNORMAL HIGH (ref 65–99)
Potassium: 4 mmol/L (ref 3.5–5.1)
Sodium: 137 mmol/L (ref 135–145)

## 2016-09-28 LAB — MAGNESIUM: Magnesium: 2 mg/dL (ref 1.7–2.4)

## 2016-09-28 MED ORDER — POTASSIUM CHLORIDE CRYS ER 20 MEQ PO TBCR
40.0000 meq | EXTENDED_RELEASE_TABLET | Freq: Once | ORAL | Status: AC
  Administered 2016-09-28: 40 meq via ORAL
  Filled 2016-09-28: qty 2

## 2016-09-28 MED ORDER — POTASSIUM CHLORIDE ER 10 MEQ PO TBCR: 20.0000 meq | EXTENDED_RELEASE_TABLET | Freq: Two times a day (BID) | ORAL | 6 refills | Status: DC

## 2016-09-28 MED ORDER — FUROSEMIDE 40 MG PO TABS: 80.0000 mg | ORAL_TABLET | Freq: Two times a day (BID) | ORAL | 2 refills | Status: DC

## 2016-09-28 MED ORDER — POTASSIUM CHLORIDE CRYS ER 20 MEQ PO TBCR: 40.0000 meq | EXTENDED_RELEASE_TABLET | Freq: Once | ORAL | Status: DC

## 2016-09-28 MED ORDER — ATORVASTATIN CALCIUM 10 MG PO TABS: 10.0000 mg | ORAL_TABLET | Freq: Every day | ORAL | 6 refills | Status: DC

## 2016-09-28 MED ORDER — DIGOXIN 125 MCG PO TABS: 0.1250 mg | ORAL_TABLET | Freq: Every day | ORAL | 6 refills | Status: DC

## 2016-09-28 MED ORDER — METOPROLOL TARTRATE 50 MG PO TABS: 50.0000 mg | ORAL_TABLET | Freq: Three times a day (TID) | ORAL | 6 refills | Status: DC

## 2016-09-28 NOTE — Progress Notes (Signed)
Progress Note  Patient Name: [name] Date of Encounter: 09/28/2016  Primary Cardiologist: Dr. Martinique  Subjective   Feels much better, no chest pain or SOB, sleeping flat on her side when I came in room  Inpatient Medications    Scheduled Meds: . atorvastatin  10 mg Oral q1800  . cholecalciferol  1,000 Units Oral Daily  . citalopram  20 mg Oral Daily  . digoxin  0.125 mg Oral Daily  . furosemide  80 mg Oral Daily  . levothyroxine  88 mcg Oral QAC breakfast  . mouth rinse  15 mL Mouth Rinse BID  . metoprolol tartrate  50 mg Oral TID  . potassium chloride  20 mEq Oral BID  . rivaroxaban  20 mg Oral Q supper  . sodium chloride flush  3 mL Intravenous Q12H  . tiotropium  18 mcg Inhalation Daily  . vitamin B-12  100 mcg Oral Daily   Continuous Infusions:  PRN Meds: sodium chloride, acetaminophen, albuterol, ondansetron (ZOFRAN) IV, sodium chloride flush, temazepam   Vital Signs    Vitals:   09/27/16 1031 09/27/16 1032 09/27/16 2030 09/28/16 0500  BP: (!) 105/55  134/86 109/69  Pulse: 68 68 (!) 102 97  Resp:   18 18  Temp:   98.4 F (36.9 C) 97.6 F (36.4 C)  TempSrc:   Oral Oral  SpO2:   94% 93%  Weight:    241 lb (109.3 kg)  Height:        Intake/Output Summary (Last 24 hours) at 09/28/16 0723 Last data filed at 09/27/16 2059  Gross per 24 hour  Intake              360 ml  Output              400 ml  Net              -40 ml   Filed Weights   09/26/16 0507 09/27/16 0500 09/28/16 0500  Weight: 247 lb 3.2 oz (112.1 kg) 242 lb 3.2 oz (109.9 kg) 241 lb (109.3 kg)    Telemetry    A fib occ PVC HR in 90s to 105 and oc 130.  - Personally Reviewed  ECG    No new from 09/20/16 - Personally Reviewed  Physical Exam   GEN: No acute distress.   Neck: No JVD Cardiac: irreg irreg no murmurs, rubs, or gallops.  Respiratory: Clear to auscultation bilaterally. GI: Soft, nontender, non-distended  MS: No to trace edema; No deformity. Neuro:  Nonfocal  Psych:  Normal affect   Labs    Chemistry Recent Labs Lab 09/25/16 0349 09/26/16 0434 09/27/16 0332  NA 137 138 138  K 3.8 3.5 2.8*  CL 93* 92* 85*  CO2 35* 38* 41*  GLUCOSE 122* 98 97  BUN 21* 20 22*  CREATININE 1.00 1.04* 1.04*  CALCIUM 9.0 8.6* 9.1  GFRNONAA 53* 50* 50*  GFRAA >60 59* 59*  ANIONGAP '9 8 12     '$ Hematology Recent Labs Lab 09/26/16 0953  WBC 6.1  RBC 4.50  HGB 16.0*  HCT 49.1*  MCV 109.1*  MCH 35.6*  MCHC 32.6  RDW 12.9  PLT 128*    Cardiac EnzymesNo results for input(s): TROPONINI in the last 168 hours. No results for input(s): TROPIPOC in the last 168 hours.   BNPNo results for input(s): BNP, PROBNP in the last 168 hours.   DDimer No results for input(s): DDIMER in the last 168 hours.  Radiology    No results found.  Cardiac Studies   Echo Study Conclusions  - Left ventricle: The cavity size was normal. Wall thickness was   increased in a pattern of moderate LVH. Systolic function was   moderately reduced. The estimated ejection fraction was in the   range of 35% to 40%. Diffuse hypokinesis. The study is not   technically sufficient to allow evaluation of LV diastolic   function. - Aortic valve: Trileaflet. Sclerosis without stenosis. There was   no regurgitation. - Mitral valve: Calcified annulus. Mildly thickened leaflets . - Left atrium: The atrium was mildly dilated. - Right ventricle: The cavity size was normal. Mildly decreased RV   systolic function. - Right atrium: Moderately dilated. - Tricuspid valve: There was moderate regurgitation. - Pulmonary arteries: PA peak pressure: 49 mm Hg (S). - Inferior vena cava: The vessel was dilated. The respirophasic   diameter changes were blunted (< 50%), consistent with elevated   central venous pressure.  Impressions:  - Compared to a prior study in 2016, the LVEF is reduced now to   35-40%. A-fib wtih RVR is noted. The ascending aorta meausres 4.0   cm. There is mild AI, mild  LAE, moderate RAE, moderate TR and   RVSP of 49 mmHg with a dilated IVC.  Patient Profile     76 y.o. female with persistent atrial fib, mod pulm HTN, morbid obesity, lung CA, COPD, OSA, DVT  And diastolic CHF     Assessment & Plan    1  Acute on chronic diastolic CHF   - diuresed overall 18 L,  Neg 40 ml overnight but not all measured..  - switch to PO diuresis laix 80 mg po BID and KCl 20 mg PO BID--hold for now new K+ orders - Crea 1.0, K low, we will replace  - O2 sats improved to 93-95% -wt 241 down from259 lbs on admit  2  Atrial fib  Xarelto and rate control, HR higher overnight most probably sec to fast diuresis  3  DVT  Xarelto    4. Hypokalemia - continue KCl 20 mEq BID, given additional 40 yesterday but today K+ 2.8 - will give 120 meq over course of AM and recheck BMP and Mg+ level at 2 pm - may be able to be discharged later  Signed, Cecilie Kicks, NP  09/28/2016, 7:23 AM    The patient was seen, examined and discussed with Cecilie Kicks, NP and I agree with the above.   The patient is ready for discharge, she is tolerating PO diuretics well, Crea normal and stable at 1.04, K low yesterday, today's pending, we will arrange for an outpatient follow up at the end of this week.   Ena Dawley, MD 09/28/2016

## 2016-09-28 NOTE — Progress Notes (Signed)
Pt has orders to be discharged. Discharge instructions given and pt has no additional questions at this time. Medication regimen reviewed and pt educated. Pt verbalized understanding and has no additional questions. Telemetry box removed. IV removed and site in good condition. Pt stable and waiting for transportation.   Ambree Frances RN 

## 2016-09-28 NOTE — Care Management Note (Addendum)
Case Management Note  Patient Details  Name: [name] MRN: [id number] Date of Birth: [birth year]  Subjective/Objective:                  CHF (congestive heart failure) [facility]) Action/Plan: Discharge planning Expected Discharge Date:  09/28/16               Expected Discharge Plan:  [place]  In-House Referral:     Discharge planning Services  CM Consult  Post Acute Care Choice:  Home Health Choice offered to:  Patient  DME Arranged:  N/A DME Agency:  NA  HH Arranged:  PT, Patient Refused [place] Agency:     Status of Service:  Completed, signed off  If discussed at [employer] of Stay Meetings, dates discussed:    Additional Comments: CM met with pt in room to confirm choice of home health agency and pt states she doesn't want anyone coming to the house until she is cleared by Dr. Martinique at the followup visit.  Pt states her daughter is providing transportation home. Cm gave pt Private Duty Agency List and pt verbalized understanding this is an out-of-pocket expense.   No other CM needs were communicated. Dellie Catholic, RN 09/28/2016, 1:36 PM

## 2016-09-29 ENCOUNTER — Telehealth: Payer: Self-pay | Admitting: Cardiology

## 2016-09-29 NOTE — Telephone Encounter (Signed)
Returned the phone call to the patient. An appointment has been made with Rosaria Ferries, PA for next Tuesday 4/17 at 1:30.

## 2016-09-29 NOTE — Telephone Encounter (Signed)
New message      Pt states she has been in the hosp for 2 weeks and need a follow up appt with Dr Martinique soon.  I offered her appts this week and next week with the APP, but she only want Dr Martinique.  Pt request to talk to the nurse.  Please call

## 2016-10-02 ENCOUNTER — Other Ambulatory Visit: Payer: Self-pay | Admitting: *Deleted

## 2016-10-02 NOTE — Patient Outreach (Signed)
[facility]) Care Management  10/02/2016  [name] May 08, 1941 [id number]  EMMI-Heart Failure referral for red dashboard ; weighed today-no:  Telephone call to patient; left message on voice mail requesting call back.  Plan: Follow up call.  Sherrin Daisy, RN BSN [place] Management Coordinator [facility] Care Management  [phone]

## 2016-10-03 ENCOUNTER — Telehealth: Payer: Self-pay | Admitting: Cardiology

## 2016-10-03 ENCOUNTER — Other Ambulatory Visit: Payer: Self-pay | Admitting: *Deleted

## 2016-10-03 NOTE — Telephone Encounter (Signed)
Patient calling the office for samples of medication:   1.  What medication and dosage are you requesting samples for?Xarelto '20mg'$    2.  Are you currently out of this medication? Yes

## 2016-10-03 NOTE — Patient Outreach (Signed)
[facility]) Care Management  10/03/2016  SAANVIKA VAZQUES [birth year] [id number]  EMMI-Heart Failure referral for red dashboard for weighed today:  Telephone call to patient to address red dashboard. Patient states she weighed today & weight was fine. Patient advised of importance of weighing daily to detect if weight gain. Patient voices understanding that weight gain of couple of pounds in a day could cause problem with breathing.   Patient advised of importance of notifying MD office if she has weight gain or 2-3 lbs in one  Day or 5 lbs in one week.   States she has appointment with cardiology office next week & has transportation. Patient advised of importance of making hospital follow up with primary care office. Patient voices understanding.   Patient states she has all of her medications & is taking as prescribed.  States she does not need any further educational information at this time. Voices no concerns at this time & no case management needs. States sees her doctors if problems.   EMMI addressed.  Plan: Close out case. Send to care management assistant.  Sherrin Daisy, RN BSN [place] Management Coordinator [facility] Care Management  [phone]

## 2016-10-03 NOTE — Telephone Encounter (Signed)
Returned the phone call to the patient. She stated that she will pick her Xarelto 20 mg samples up on Tuesday when she comes for her appointment.

## 2016-10-06 ENCOUNTER — Other Ambulatory Visit: Payer: Self-pay | Admitting: *Deleted

## 2016-10-06 NOTE — Patient Outreach (Signed)
[facility]) Care Management  10/06/2016  EMMALEA TREANOR 1940-07-16 [id number]  EMMI Heart Failure referral for red dashboard for sad. Lost interest in things 4/15; weight -244 4/14:  Telephone call to patient who advised that she was not depressed or sad and that she wishes to discontinue EMMI-Calls.  States that she has pt on some weight. Patient was advised of reasons for calls & importance of questions regarding weight gain & symptoms to report to MD as it relates to heart failure.  Patient voices understand & will have appointment with cardiologist on tomorrow 04/17. States she has called to request hospital follow up appointment with primary care provider & was advised that she would not need to be seen and stated she would need to follow up with her cardiologist.  States she had seen PCP March 22nd & next appointment May 2018.  States she is taking medications as instructed by doctors.   Patient voices that she does not need any further educational literature about HF.  No further needs at this time.  Plan: Close Case Discontinue EMMI calls at patient request. Send to care management assistant.  Sherrin Daisy, RN BSN [place] Management Coordinator [facility] Care Management  [phone]

## 2016-10-07 ENCOUNTER — Encounter: Payer: Self-pay | Admitting: Physician Assistant

## 2016-10-07 ENCOUNTER — Ambulatory Visit (INDEPENDENT_AMBULATORY_CARE_PROVIDER_SITE_OTHER): Payer: Medicare Other | Admitting: Physician Assistant

## 2016-10-07 VITALS — BP 108/70 | HR 118 | Ht 62.0 in | Wt 250.0 lb

## 2016-10-07 DIAGNOSIS — I5033 Acute on chronic diastolic (congestive) heart failure: Secondary | ICD-10-CM | POA: Diagnosis not present

## 2016-10-07 DIAGNOSIS — I4819 Other persistent atrial fibrillation: Secondary | ICD-10-CM

## 2016-10-07 DIAGNOSIS — I481 Persistent atrial fibrillation: Secondary | ICD-10-CM | POA: Diagnosis not present

## 2016-10-07 DIAGNOSIS — Z79899 Other long term (current) drug therapy: Secondary | ICD-10-CM

## 2016-10-07 LAB — BASIC METABOLIC PANEL
BUN: 16 mg/dL (ref 7–25)
CO2: 30 mmol/L (ref 20–31)
Calcium: 9.5 mg/dL (ref 8.6–10.4)
Chloride: 98 mmol/L (ref 98–110)
Creat: 1.06 mg/dL — ABNORMAL HIGH (ref 0.60–0.93)
Glucose, Bld: 89 mg/dL (ref 65–99)
Potassium: 4.1 mmol/L (ref 3.5–5.3)
Sodium: 139 mmol/L (ref 135–146)

## 2016-10-07 MED ORDER — DIGOXIN 125 MCG PO TABS: 0.1250 mg | ORAL_TABLET | Freq: Every day | ORAL | 2 refills | Status: DC

## 2016-10-07 NOTE — Patient Instructions (Addendum)
Medication Instructions:  Your physician recommends that you continue on your current medications as directed. Please refer to the Current Medication list given to you today.  OK TO TAKE EXTRA LASIX ('40MG'$ - TOTAL OF 120 MG IN THE AM) AS NEEDED FOR WEIGHT GAIN >3 POUNDS IN A DAY OR 5 POUNDS IN A WEEK  If you need a refill on your cardiac medications before your next appointment, please call your pharmacy.  Labwork: BMP TODAY AT [facility] ON THE 1ST FLOOR  Follow-Up: Your physician wants you to follow-up in: 2 WEEKS WITH DR Martinique OR RHONDA BARRETT, PA-C   Special Instructions: REFERRAL TO NUTRITION SERVICES  CONTINUE DAILY WEIGHTS  1.5 LITRE FLUID RESTRICTION  2,000 MG LOW SODIUM DIET  CHECK LEAN CUISINE LABELS THAT ARE MANY WITH < 500 MG OF SODIUM     Thank you for choosing [facility] [place]!!    RHONDA BARRETT, PA-C Treyshon Buchanon, LPN    DASH Eating Plan DASH stands for "Dietary Approaches to Stop Hypertension." The DASH eating plan is a healthy eating plan that has been shown to reduce high blood pressure (hypertension). It may also reduce your risk for type 2 diabetes, heart disease, and stroke. The DASH eating plan may also help with weight loss. What are tips for following this plan? General guidelines   Avoid eating more than 2,300 mg (milligrams) of salt (sodium) a day. If you have hypertension, you may need to reduce your sodium intake to 1,500 mg a day.  Limit alcohol intake to no more than 1 drink a day for nonpregnant women and 2 drinks a day for men. One drink equals 12 oz of beer, 5 oz of wine, or 1 oz of hard liquor.  Work with your health care provider to maintain a healthy body weight or to lose weight. Ask what an ideal weight is for you.  Get at least 30 minutes of exercise that causes your heart to beat faster (aerobic exercise) most days of the week. Activities may include walking, swimming, or biking.  Work with your health care provider or  diet and nutrition specialist (dietitian) to adjust your eating plan to your individual calorie needs. Reading food labels   Check food labels for the amount of sodium per serving. Choose foods with less than 5 percent of the Daily Value of sodium. Generally, foods with less than 300 mg of sodium per serving fit into this eating plan.  To find whole grains, look for the word "whole" as the first word in the ingredient list. Shopping   Buy products labeled as "low-sodium" or "no salt added."  Buy fresh foods. Avoid canned foods and premade or frozen meals. Cooking   Avoid adding salt when cooking. Use salt-free seasonings or herbs instead of table salt or sea salt. Check with your health care provider or pharmacist before using salt substitutes.  Do not fry foods. Cook foods using healthy methods such as baking, boiling, grilling, and broiling instead.  Cook with heart-healthy oils, such as olive, canola, soybean, or sunflower oil. Meal planning    Eat a balanced diet that includes:  5 or more servings of fruits and vegetables each day. At each meal, try to fill half of your plate with fruits and vegetables.  Up to 6-8 servings of whole grains each day.  Less than 6 oz of lean meat, poultry, or fish each day. A 3-oz serving of meat is about the same size as a deck of cards. One egg equals  1 oz.  2 servings of low-fat dairy each day.  A serving of nuts, seeds, or beans 5 times each week.  Heart-healthy fats. Healthy fats called Omega-3 fatty acids are found in foods such as flaxseeds and coldwater fish, like sardines, salmon, and mackerel.  Limit how much you eat of the following:  Canned or prepackaged foods.  Food that is high in trans fat, such as fried foods.  Food that is high in saturated fat, such as fatty meat.  Sweets, desserts, sugary drinks, and other foods with added sugar.  Full-fat dairy products.  Do not salt foods before eating.  Try to eat at least 2  vegetarian meals each week.  Eat more home-cooked food and less restaurant, buffet, and fast food.  When eating at a restaurant, ask that your food be prepared with less salt or no salt, if possible. What foods are recommended? The items listed may not be a complete list. Talk with your dietitian about what dietary choices are best for you. Grains  Whole-grain or whole-wheat bread. Whole-grain or whole-wheat pasta. Brown rice. [place]ena Morrow. Bulgur. Whole-grain and low-sodium cereals. Pita bread. Low-fat, low-sodium crackers. Whole-wheat flour tortillas. Vegetables  Fresh or frozen vegetables (raw, steamed, roasted, or grilled). Low-sodium or reduced-sodium tomato and vegetable juice. Low-sodium or reduced-sodium tomato sauce and tomato paste. Low-sodium or reduced-sodium canned vegetables. Fruits  All fresh, dried, or frozen fruit. Canned fruit in natural juice (without added sugar). Meat and other protein foods  Skinless chicken or [place]. Ground chicken or [place]. Pork with fat trimmed off. Fish and seafood. Egg whites. Dried beans, peas, or lentils. Unsalted nuts, nut butters, and seeds. Unsalted canned beans. Lean cuts of beef with fat trimmed off. Low-sodium, lean deli meat. Dairy  Low-fat (1%) or fat-free (skim) milk. Fat-free, low-fat, or reduced-fat cheeses. Nonfat, low-sodium ricotta or cottage cheese. Low-fat or nonfat yogurt. Low-fat, low-sodium cheese. Fats and oils  Soft margarine without trans fats. Vegetable oil. Low-fat, reduced-fat, or light mayonnaise and salad dressings (reduced-sodium). Canola, safflower, olive, soybean, and sunflower oils. Avocado. Seasoning and other foods  Herbs. Spices. Seasoning mixes without salt. Unsalted popcorn and pretzels. Fat-free sweets. What foods are not recommended? The items listed may not be a complete list. Talk with your dietitian about what dietary choices are best for you. Grains  Baked goods made with fat, such as croissants,  muffins, or some breads. Dry pasta or rice meal packs. Vegetables  Creamed or fried vegetables. Vegetables in a cheese sauce. Regular canned vegetables (not low-sodium or reduced-sodium). Regular canned tomato sauce and paste (not low-sodium or reduced-sodium). Regular tomato and vegetable juice (not low-sodium or reduced-sodium). Angie Fava. Olives. Fruits  Canned fruit in a light or heavy syrup. Fried fruit. Fruit in cream or butter sauce. Meat and other protein foods  Fatty cuts of meat. Ribs. Fried meat. Berniece Salines. Sausage. Bologna and other processed lunch meats. Salami. Fatback. Hotdogs. Bratwurst. Salted nuts and seeds. Canned beans with added salt. Canned or smoked fish. Whole eggs or egg yolks. Chicken or [place] with skin. Dairy  Whole or 2% milk, cream, and half-and-half. Whole or full-fat cream cheese. Whole-fat or sweetened yogurt. Full-fat cheese. Nondairy creamers. Whipped toppings. Processed cheese and cheese spreads. Fats and oils  Butter. Stick margarine. Lard. Shortening. Ghee. Bacon fat. Tropical oils, such as coconut, palm kernel, or palm oil. Seasoning and other foods  Salted popcorn and pretzels. Onion salt, garlic salt, seasoned salt, table salt, and sea salt. Worcestershire sauce. Tartar sauce. Barbecue sauce. Teriyaki  sauce. Soy sauce, including reduced-sodium. Steak sauce. Canned and packaged gravies. Fish sauce. Oyster sauce. Cocktail sauce. Horseradish that you find on the shelf. Ketchup. Mustard. Meat flavorings and tenderizers. Bouillon cubes. Hot sauce and Tabasco sauce. Premade or packaged marinades. Premade or packaged taco seasonings. Relishes. Regular salad dressings. Where to find more information:  National Heart, Lung, and [place]: https://wilson-eaton.com/  American Heart Association: www.heart.org Summary  The DASH eating plan is a healthy eating plan that has been shown to reduce high blood pressure (hypertension). It may also reduce your risk for type 2  diabetes, heart disease, and stroke.  With the DASH eating plan, you should limit salt (sodium) intake to 2,300 mg a day. If you have hypertension, you may need to reduce your sodium intake to 1,500 mg a day.  When on the DASH eating plan, aim to eat more fresh fruits and vegetables, whole grains, lean proteins, low-fat dairy, and heart-healthy fats.  Work with your health care provider or diet and nutrition specialist (dietitian) to adjust your eating plan to your individual calorie needs. This information is not intended to replace advice given to you by your health care provider. Make sure you discuss any questions you have with your health care provider. Document Released: 05/29/2011 Document Revised: 06/02/2016 Document Reviewed: 06/02/2016 Elsevier Interactive Patient Education  2017 [employer].

## 2016-10-07 NOTE — Progress Notes (Signed)
Cardiology Office Note   Date:  10/09/2016   ID:  [name], DOB 1941-06-16, MRN [id number]  PCP:  Geoffery Lyons, MD  Cardiologist:  Dr Martinique  Chaye Misch, PA-C   Chief Complaint  Patient presents with  . Follow-up    hospital followup    History of Present Illness: [name] is a 76 y.o. female with a history of  persistent atrial fib, mod pulm HTN, morbid obesity, lung CA, COPD on home O2, OSA, DVT on Xarelto, D-CHF, recent dx afib, PAD w/ Ao-bi-iliac bpg, 4.5 cm thoracic aneurysm (Dr Early), CHAS2DS2VASC=6  Admitted 03/26-04/01/2017 with S-D-CHF, 241 lbs at d/c, Afib was controlled.   [name] presents for post-hospital  Pt O2 sats were 84% on room air. Improved to 95% with O2 at home dose. She got a new set of scales after d/c. She has gained 4 lbs in the last 4 days. She is eating less prepared foods, but has eaten pizza recently and had some Lean Cuisine meals as well. She does not add salt to food. She wonders if she can have coffee. She is interested in seeing a nutritionist. Leaving the house is tough, though. She has not been watching her sodium very closely. She is not over drinking.  Her SOB is bad. She requires her O2 and various nebs and inhalers.  She has minimal lower extremity edema. She has chronic orthopnea. She denies PND.   Past Medical History:  Diagnosis Date  . AAA (abdominal aortic aneurysm) ([place])   . Anticoagulant long-term use    Failed on Coumadin. On Xarelto  . Anxiety   . Anxiety and depression   . Atrial fib/flutter, transient June 2012  . Chronic lower back pain   . COPD (chronic obstructive pulmonary disease) ([place])   . DVT (deep venous thrombosis) ([place]) 2004   BLE  . Esophageal dysmotility   . Exertional dyspnea   . Hiatal hernia   . History of bronchitis   . History of fibrocystic disease of breast   . History of uterine fibroid   . HTN (hypertension)   . Hyperlipidemia   . Hypothyroidism   . Normal nuclear  stress test 2012   May 2012  . OA (osteoarthritis)    "knees; left shoulder"  . OSA (obstructive sleep apnea)    "haven't been using my CPAP lately" (01/21/12)  . Ovarian mass    right benign  . Ovarian mass    benign, right  . PAD (peripheral artery disease) ([place])   . Small cell carcinoma of lung ([place]) 2004   NON-SMALL CELL CARCINOMA OF THE LUNG, METASTATIC TO THE SUPRACLAVICULAR AND MEDIASTINAL LYMPH NODES; in remission    Past Surgical History:  Procedure Laterality Date  . ABDOMINAL AORTIC ANEURYSM REPAIR  ~ 2010   stent graft  . BLADDER SURGERY  ~ 2003   sling  . BREAST BIOPSY  1960's   both breast's - benign  . CARDIOVASCULAR STRESS TEST  2012   No ischemia  . CATARACT EXTRACTION W/ INTRAOCULAR LENS  IMPLANT, BILATERAL Bilateral ~ 2009  . REPLACEMENT TOTAL KNEE Left ~ 2008   left  . THYROIDECTOMY  ~ 1964    Current Outpatient Prescriptions  Medication Sig Dispense Refill  . CALCIUM-MAGNESIUM-ZINC PO Take 1 tablet by mouth daily.     . Cholecalciferol (VITAMIN D) 1000 UNITS capsule Take 1,000 Units by mouth daily.      . citalopram (CELEXA) 20 MG tablet Take 20  mg by mouth daily.     . digoxin (LANOXIN) 0.125 MG tablet Take 1 tablet (0.125 mg total) by mouth daily. 30 tablet 6  . furosemide (LASIX) 40 MG tablet Take 2 tablets (80 mg total) by mouth 2 (two) times daily. 360 tablet 2  . levothyroxine (SYNTHROID, LEVOTHROID) 88 MCG tablet Take 88 mcg by mouth daily.    . metoprolol (LOPRESSOR) 50 MG tablet Take 1 tablet (50 mg total) by mouth 3 (three) times daily. 90 tablet 6  . NON FORMULARY daily. 2 liters oxygen    . OXYGEN Inhale 2 L into the lungs daily.    . potassium chloride (K-DUR) 10 MEQ tablet Take 2 tablets (20 mEq total) by mouth 2 (two) times daily. 120 tablet 6  . rivaroxaban (XARELTO) 20 MG TABS tablet Take 1 tablet (20 mg total) by mouth daily with supper. 30 tablet 5  . SPIRIVA HANDIHALER 18 MCG inhalation capsule PLACE 1 CAPSULE INTO INHALER AND INHALE  ONCE DAILY AS DIRECTED 30 capsule 6  . temazepam (RESTORIL) 15 MG capsule TAKE 1 CAPSULE AT BEDTIME AS NEEDED  FOR  SLEEP 90 capsule 1  . VENTOLIN HFA 108 (90 Base) MCG/ACT inhaler TAKE 2 PUFFS EVERY 6 HOURS AS NEEDED FORSHORTNESS OF BREATH/WHEEZING 18 g 4  . vitamin B-12 (CYANOCOBALAMIN) 100 MCG tablet Take 100 mcg by mouth daily.     No current facility-administered medications for this visit.     Allergies:   Patient has no known allergies.    Social History:  The patient  reports that she quit smoking about 15 years ago. Her smoking use included Cigarettes. She has a 40.00 pack-year smoking history. She has never used smokeless tobacco. She reports that she drinks alcohol. She reports that she does not use drugs.   Family History:  The patient's family history includes Diabetes in her mother; Diabetes type II in her mother; Heart attack in her father; Heart disease in her brother, father, and sister; Hypertension in her mother.    ROS:  Please see the history of present illness. All other systems are reviewed and negative.    PHYSICAL EXAM: VS:  BP 108/70   Pulse (!) 118   Ht '5\' 2"'$  (1.575 m)   Wt 250 lb (113.4 kg)   BMI 45.73 kg/m  , BMI Body mass index is 45.73 kg/m. GEN: Well nourished, well developed, female in no acute distress  HEENT: normal for age  Neck: JVD 9 centimeters, no carotid bruit, no masses Cardiac: Irregular rate and rhythm; soft murmur, no rubs, or gallops Respiratory: Bibasilar rales, no wheeze, increased work of breathing GI: soft, nontender, nondistended, + BS MS: no deformity or atrophy; 1+ edema; distal pulses are 2+ in all 4 extremities   Skin: warm and dry, no rash Neuro:  Strength and sensation are intact Psych: euthymic mood, full affect   EKG:  EKG is not ordered today.  ECHO: 09/17/2016 - Left ventricle: The cavity size was normal. Wall thickness was increased in a pattern of moderate LVH. Systolic function was moderately reduced. The  estimated ejection fraction was in the range of 35% to 40%. Diffuse hypokinesis. The study is not technically sufficient to allow evaluation of LV diastolicfunction. - Aortic valve: Trileaflet. Sclerosis without stenosis. There was no regurgitation. - Mitral valve: Calcified annulus. Mildly thickened leaflets . - Left atrium: The atrium was mildly dilated. - Right ventricle: The cavity size was normal. Mildly decreased RV systolic function. - Right atrium: Moderately dilated. -  Tricuspid valve: There was moderate regurgitation. - Pulmonary arteries: PA peak pressure: 49 mm Hg (S). - Inferior vena cava: The vessel was dilated. The respirophasic diameter changes were blunted (<50%), consistent with elevated central venous pressure. Impressions: - Compared to a prior study in 2016, the LVEF is reduced now to 35-40%. A-fib wtih RVR is noted. The ascending aorta meausres 4.0 cm. There is mild AI, mild LAE, moderate RAE, moderate TR and RVSP of 49 mmHg with a dilated IVC.  Recent Labs: 09/15/2016: ALT 22; B Natriuretic Peptide 348.5; TSH 2.813 09/26/2016: Hemoglobin 16.0; Platelets 128 09/28/2016: Magnesium 2.0 10/07/2016: BUN 16; Creat 1.06; Potassium 4.1; Sodium 139    Lipid Panel No results found for: CHOL, TRIG, HDL, CHOLHDL, VLDL, LDLCALC, LDLDIRECT   Wt Readings from Last 3 Encounters:  10/07/16 250 lb (113.4 kg)  09/28/16 241 lb (109.3 kg)  07/14/16 253 lb 9.6 oz (115 kg)     Other studies Reviewed: Additional studies/ records that were reviewed today include: Office notes, hospital records and testing.  ASSESSMENT AND PLAN:  1.  Acute on chronic systolic CHF: She has significant dietary indiscretions. She is short of breath at rest. She was already on home O2 because of her COPD. She will have to Stick tightly to 2000 mg sodium diet and 1.5 L of fluid restrictions or we will not be able to manage her volume.   She is already on a high dose of Lasix and  she will need to increase this temporarily. We will check a BMET today. She needs early follow-up with a repeat check of her labs. She was advised that if we were not able to control her volume, she would need admission. She wishes to avoid admission.  2. Persistent atrial fibrillation: Her heart rate is not that high despite the respiratory distress. Continue current medications.  3. Chronic anticoagulation: Continue Xarelto.   Current medicines are reviewed at length with the patient today.  The patient does not have concerns regarding medicines.  The following changes have been made:  Increase Lasix temporarily when necessary for weight gain or edema  Labs/ tests ordered today include:   Orders Placed This Encounter  Procedures  . Basic metabolic panel  . Amb Referral to Nutrition and Diabetic E     Disposition:   FU with Dr. Martinique  Signed, Bonnie Ferries, PA-C   10/09/2016 5:25 PM    [facility] Phone: [phone]; Fax: [phone]  This note was written with the assistance of speech recognition software. Please excuse any transcriptional errors.

## 2016-10-13 DIAGNOSIS — G894 Chronic pain syndrome: Secondary | ICD-10-CM | POA: Diagnosis not present

## 2016-10-13 DIAGNOSIS — M25512 Pain in left shoulder: Secondary | ICD-10-CM | POA: Diagnosis not present

## 2016-10-13 DIAGNOSIS — G8929 Other chronic pain: Secondary | ICD-10-CM | POA: Diagnosis not present

## 2016-10-14 ENCOUNTER — Telehealth: Payer: Self-pay | Admitting: Physician Assistant

## 2016-10-14 NOTE — Telephone Encounter (Signed)
Pt wants to know what is she supposed to be taking Losartan for? Pt says she does not think that she have been taking it.

## 2016-10-14 NOTE — Telephone Encounter (Signed)
Returned the phone call to the patient. She stated that she didn't think she was supposed to take the Losartan anymore. According to her current medication list and her hospital discharge papers on 4/7 this medication has been discontinued.  STOP taking these medications     CARTIA XT 120 MG 24 hr capsule      losartan (COZAAR) 50 MG tablet      Her next appointment is 5/14 with Rosaria Ferries, PA. The patient stated that this was her understanding.

## 2016-10-28 ENCOUNTER — Ambulatory Visit: Payer: Medicare Other | Admitting: Dietician

## 2016-11-03 ENCOUNTER — Ambulatory Visit: Payer: Medicare Other | Admitting: Physician Assistant

## 2016-11-06 ENCOUNTER — Ambulatory Visit: Payer: Medicare Other | Admitting: Nurse Practitioner

## 2016-11-11 ENCOUNTER — Encounter: Payer: Self-pay | Admitting: Physician Assistant

## 2016-11-11 ENCOUNTER — Ambulatory Visit (INDEPENDENT_AMBULATORY_CARE_PROVIDER_SITE_OTHER): Payer: Medicare Other | Admitting: Physician Assistant

## 2016-11-11 VITALS — BP 110/64 | HR 84 | Ht 62.0 in | Wt 249.0 lb

## 2016-11-11 DIAGNOSIS — I481 Persistent atrial fibrillation: Secondary | ICD-10-CM

## 2016-11-11 DIAGNOSIS — Z79899 Other long term (current) drug therapy: Secondary | ICD-10-CM | POA: Diagnosis not present

## 2016-11-11 DIAGNOSIS — I5033 Acute on chronic diastolic (congestive) heart failure: Secondary | ICD-10-CM

## 2016-11-11 DIAGNOSIS — I4819 Other persistent atrial fibrillation: Secondary | ICD-10-CM

## 2016-11-11 LAB — BASIC METABOLIC PANEL
BUN/Creatinine Ratio: 13 (ref 12–28)
BUN: 10 mg/dL (ref 8–27)
CO2: 28 mmol/L (ref 18–29)
Calcium: 8.6 mg/dL — ABNORMAL LOW (ref 8.7–10.3)
Chloride: 98 mmol/L (ref 96–106)
Creatinine, Ser: 0.78 mg/dL (ref 0.57–1.00)
GFR calc Af Amer: 85 mL/min/{1.73_m2} (ref >59)
GFR calc non Af Amer: 74 mL/min/{1.73_m2} (ref >59)
Glucose: 86 mg/dL (ref 65–99)
Potassium: 3.7 mmol/L (ref 3.5–5.2)
Sodium: 140 mmol/L (ref 134–144)

## 2016-11-11 MED ORDER — POTASSIUM CHLORIDE ER 10 MEQ PO TBCR: 20.0000 meq | EXTENDED_RELEASE_TABLET | Freq: Two times a day (BID) | ORAL | 2 refills | Status: DC

## 2016-11-11 MED ORDER — METOLAZONE 2.5 MG PO TABS: ORAL_TABLET | ORAL | 2 refills | Status: DC

## 2016-11-11 NOTE — Patient Instructions (Addendum)
Medication Instructions:  START METOLAZONE 2.5 MG DAILY FOR 3 DAYS ONLY OK TO TAKE IF YOU GAIN 3 POUNDS IN 24 HOURS OR 5 POUNDS IN A WEEK   TAKE AN EXTRA KDUR (POTASSIUM) 20 MEQ (2 TABLETS ) ON DAYS YOU TAKE METOLAZONE OR IF YOU GET LEG CRAMPS  Labwork: BMET TODAY  Testing/Procedures: NONE  Follow-Up: KEEP YOUR June 1 FOLLOW UP WITH DR Martinique   If you need a refill on your cardiac medications before your next appointment, please call your pharmacy.

## 2016-11-11 NOTE — Progress Notes (Signed)
Cardiology Office Note   Date:  11/11/2016   ID:  BARABARA MOTZ, DOB August 06, 1940, MRN [id number]  PCP:  Burnard Bunting, MD  Cardiologist:  Dr Martinique, 05/12/2016  Rosaria Ferries, PA-C 10/07/2016  Chief Complaint  Patient presents with  . Follow-up    pt c/o same Sx--fatigue, SOB and swelling (which is no better than last ov), dizziness when laying in the bed at night    History of Present Illness: [name] is a 76 y.o. female with a history of PAF on Xarelto, D-CHF, COPD on home O2, Anxiety, DVT, HTN, HLD, hypothyroid, OA, NSC lung CA, OSA, Aobi-iliac stent graft, 4.5 cm thoracic aneurysm (Dr Early), CHAS2DS2VASC=7 (female, age x 2, DVT x 2, CHF, HTN)  Admit 03/26>09/28/2016 for rapid afib and CHF, no amio w/ lung dz, Tikosyn and Sotalol contraindicated with prolonged QT. Multaq and flecainide are not good choices with CHF. Wt at d/c 241 lbs, HR 90s. 04/17 office visit, pt wt up 4 lbs, Lasix increased short-term, compliance w/ low Na diet, early f/u needed   ALVARETTA Ball presents for cardiology followup.  She feels her breathing is not good today. On her home weight charts, her weight is the highest it has been since 04/17 at 247. She does not feel she had excessive food or sodium, although she did eat a small amount of restaurant food last pm.   She feels fatigued and has increased DOE. She just gets SOB with minimal activity and struggles to take a shower. She is compliant with rx and uses her nebulizers occasionally, Once or twice a day. She uses the albuterol inhaler twice a day.   She is up 2-3 x night to urinate so denies PND. She sleeps on 2 pillows, no recent change. She wakes with LE edema and it worsens during the day. Her main problem is shortness of breath, she does not often hear herself wheeze.  She has had problems with swallowing and had a study that showed she swallowed very slowly. She wonders if she would benefit from a GI referral. She agrees to discuss this  with her PCP.   Past Medical History:  Diagnosis Date  . AAA (abdominal aortic aneurysm) ([place])   . Anticoagulant long-term use    Failed on Coumadin. On Xarelto  . Anxiety   . Anxiety and depression   . Atrial fib/flutter, transient June 2012  . Chronic lower back pain   . COPD (chronic obstructive pulmonary disease) ([place])   . DVT (deep venous thrombosis) ([place]) 2004   BLE  . Esophageal dysmotility   . Exertional dyspnea   . Hiatal hernia   . History of bronchitis   . History of fibrocystic disease of breast   . History of uterine fibroid   . HTN (hypertension)   . Hyperlipidemia   . Hypothyroidism   . Normal nuclear stress test 2012   May 2012  . OA (osteoarthritis)    "knees; left shoulder"  . OSA (obstructive sleep apnea)    "haven't been using my CPAP lately" (01/21/12)  . Ovarian mass    right benign  . Ovarian mass    benign, right  . PAD (peripheral artery disease) ([place])   . Small cell carcinoma of lung ([place]) 2004   NON-SMALL CELL CARCINOMA OF THE LUNG, METASTATIC TO THE SUPRACLAVICULAR AND MEDIASTINAL LYMPH NODES; in remission    Past Surgical History:  Procedure Laterality Date  . ABDOMINAL AORTIC ANEURYSM REPAIR  ~ 2010  stent graft  . BLADDER SURGERY  ~ 2003   sling  . BREAST BIOPSY  1960's   both breast's - benign  . CARDIOVASCULAR STRESS TEST  2012   No ischemia  . CATARACT EXTRACTION W/ INTRAOCULAR LENS  IMPLANT, BILATERAL Bilateral ~ 2009  . REPLACEMENT TOTAL KNEE Left ~ 2008   left  . THYROIDECTOMY  ~ 1964    Current Outpatient Prescriptions  Medication Sig Dispense Refill  . CALCIUM-MAGNESIUM-ZINC PO Take 1 tablet by mouth daily.     . Cholecalciferol (VITAMIN D) 1000 UNITS capsule Take 1,000 Units by mouth daily.      . citalopram (CELEXA) 20 MG tablet Take 20 mg by mouth daily.     . digoxin (LANOXIN) 0.125 MG tablet Take 1 tablet (0.125 mg total) by mouth daily. 90 tablet 2  . furosemide (LASIX) 40 MG tablet Take 2 tablets (80 mg total)  by mouth 2 (two) times daily. 360 tablet 2  . levothyroxine (SYNTHROID, LEVOTHROID) 88 MCG tablet Take 88 mcg by mouth daily.    . metoprolol (LOPRESSOR) 50 MG tablet Take 1 tablet (50 mg total) by mouth 3 (three) times daily. 90 tablet 6  . morphine (MSIR) 15 MG tablet Take 1 tablet by mouth as needed.    . NON FORMULARY daily. 2 liters oxygen    . OXYGEN Inhale 2 L into the lungs daily.    . potassium chloride (K-DUR) 10 MEQ tablet Take 2 tablets (20 mEq total) by mouth 2 (two) times daily. 120 tablet 6  . rivaroxaban (XARELTO) 20 MG TABS tablet Take 1 tablet (20 mg total) by mouth daily with supper. 30 tablet 5  . SPIRIVA HANDIHALER 18 MCG inhalation capsule PLACE 1 CAPSULE INTO INHALER AND INHALE ONCE DAILY AS DIRECTED 30 capsule 6  . temazepam (RESTORIL) 15 MG capsule TAKE 1 CAPSULE AT BEDTIME AS NEEDED  FOR  SLEEP 90 capsule 1  . VENTOLIN HFA 108 (90 Base) MCG/ACT inhaler TAKE 2 PUFFS EVERY 6 HOURS AS NEEDED FORSHORTNESS OF BREATH/WHEEZING 18 g 4  . vitamin B-12 (CYANOCOBALAMIN) 100 MCG tablet Take 100 mcg by mouth daily.     No current facility-administered medications for this visit.     Allergies:   Patient has no known allergies.    Social History:  The patient  reports that she quit smoking about 15 years ago. Her smoking use included Cigarettes. She has a 40.00 pack-year smoking history. She has never used smokeless tobacco. She reports that she drinks alcohol. She reports that she does not use drugs.   Family History:  The patient's family history includes Diabetes in her mother; Diabetes type II in her mother; Heart attack in her father; Heart disease in her brother, father, and sister; Hypertension in her mother.    ROS:  Please see the history of present illness. All other systems are reviewed and negative.    PHYSICAL EXAM: VS:  BP 110/64 (BP Location: Left Arm, Patient Position: Sitting, Cuff Size: Large)   Pulse 84   Ht '5\' 2"'$  (1.575 m)   Wt 249 lb (112.9 kg)   SpO2  93%   BMI 45.54 kg/m  , BMI Body mass index is 45.54 kg/m. GEN: Well nourished, well developed, female in no acute distress  HEENT: normal for age  Neck: JVD 9 cm, no carotid bruit, no masses Cardiac: RRR; soft murmur, no rubs, or gallops Respiratory: decreased BS bases w/ basilar rales bilaterally, normal work of breathing GI: soft, nontender, nondistended, +  BS MS: no deformity or atrophy; 1+ edema; severe brawny induration noted, distal pulses are 1-2+ in all 4 extremities   Skin: warm and dry, no rash Neuro:  Strength and sensation are intact Psych: euthymic mood, full affect   EKG:  EKG is not ordered today.  ECHO: 09/17/2016 - Left ventricle: The cavity size was normal. Wall thickness was increased in a pattern of moderate LVH. Systolic function was moderately reduced. The estimated ejection fraction was in the range of 35% to 40%. Diffuse hypokinesis. The study is not technically sufficient to allow evaluation of LV diastolicfunction. - Aortic valve: Trileaflet. Sclerosis without stenosis. There was no regurgitation. - Mitral valve: Calcified annulus. Mildly thickened leaflets . - Left atrium: The atrium was mildly dilated. - Right ventricle: The cavity size was normal. Mildly decreased RV systolic function. - Right atrium: Moderately dilated. - Tricuspid valve: There was moderate regurgitation. - Pulmonary arteries: PA peak pressure: 49 mm Hg (S). - Inferior vena cava: The vessel was dilated. The respirophasic diameter changes were blunted (<50%), consistent with elevated central venous pressure. Impressions: - Compared to a prior study in 2016, the LVEF is reduced now to 35-40%. A-fib wtih RVR is noted. The ascending aorta meausres 4.0 cm. There is mild AI, mild LAE, moderate RAE, moderate TR and RVSP of 49 mmHg with a dilated IVC.  Recent Labs: 09/15/2016: ALT 22; B Natriuretic Peptide 348.5; TSH 2.813 09/26/2016: Hemoglobin 16.0; Platelets  128 09/28/2016: Magnesium 2.0 10/07/2016: BUN 16; Creat 1.06; Potassium 4.1; Sodium 139    Lipid Panel No results found for: CHOL, TRIG, HDL, CHOLHDL, VLDL, LDLCALC, LDLDIRECT   Wt Readings from Last 3 Encounters:  11/11/16 249 lb (112.9 kg)  10/07/16 250 lb (113.4 kg)  09/28/16 241 lb (109.3 kg)     Other studies Reviewed: Additional studies/ records that were reviewed today include: Office notes, previous testing.  ASSESSMENT AND PLAN:  1.  Acute on chronic diastolic CHF: Her weight improved after her visit last month, but is trending back up. She states she is watching her sodium and not eating out much. She also states that the amount of food she is eating has decreased. I discussed the patient briefly with Dr. Martinique. He recommends adding metolazone 2.5 mg for 3 days and then as needed for weight gain. I will increase her potassium when she is on the metolazone as well. Check a BMET today. Keep her follow-up appointment with Dr. Martinique.  2. COPD: She is not currently wheezing on exam. Her oxygen saturation on O2 is 93%. Follow up with Dr. Annamaria Boots.  3. GI issues: She states that her by mouth intake has decreased, because she gets fatigued or maybe a little short of breath when she tries to eat. She is encouraged to discuss this with her PCP.  4. Persistent atrial fibrillation: Her heart rate seems to run generally less than 100. She is on Xarelto. She is not having any bleeding issues. Partly because of her respiratory issues, I do not feel that she would maintain sinus rhythm so we will continue with the strategy of rate control and anticoagulation.   Current medicines are reviewed at length with the patient today.  The patient does not have concerns regarding medicines.  The following changes have been made:  Add metolazone for 3 days and when necessary, increase potassium    Labs/ tests ordered today include:  No orders of the defined types were placed in this  encounter.    Disposition:  FU with Dr. Martinique on June 1.  Augusto Garbe  11/11/2016 11:58 AM    [place] Phone: [phone]; Fax: [phone]  This note was written with the assistance of speech recognition software. Please excuse any transcriptional errors.

## 2016-11-13 ENCOUNTER — Ambulatory Visit (INDEPENDENT_AMBULATORY_CARE_PROVIDER_SITE_OTHER): Payer: Medicare Other | Admitting: Internal Medicine

## 2016-11-13 ENCOUNTER — Encounter: Payer: Self-pay | Admitting: Internal Medicine

## 2016-11-13 DIAGNOSIS — G4733 Obstructive sleep apnea (adult) (pediatric): Secondary | ICD-10-CM | POA: Diagnosis not present

## 2016-11-13 DIAGNOSIS — J449 Chronic obstructive pulmonary disease, unspecified: Secondary | ICD-10-CM | POA: Diagnosis not present

## 2016-11-13 DIAGNOSIS — J9611 Chronic respiratory failure with hypoxia: Secondary | ICD-10-CM | POA: Diagnosis not present

## 2016-11-13 DIAGNOSIS — I5043 Acute on chronic combined systolic (congestive) and diastolic (congestive) heart failure: Secondary | ICD-10-CM | POA: Diagnosis not present

## 2016-11-13 NOTE — Patient Instructions (Addendum)
Sample Stiolto Respimat inhaler      Inhale 2 puffs, once daily    Try this instead of Spiriva. If you like Stiolto, let us know. If it is no better than Spiriva, just go back to your Spiriva.  Ok to use your oxygen at 3L  Please call as needed

## 2016-11-13 NOTE — Progress Notes (Signed)
HPI female former smoker followed for COPD, chronic respiratory failure with hypoxia, OSA/failed CPAP, history lung CA/XRT/chemotherapy, DVT, complicated by AFib, chronic diastolic CHF  2683- Had CT scan of chest 05/06/2011 to followup of her aortic aneurysm. Showed emphysema, stable aneurysm, enlarged right lobe of thyroid, coronary disease. 2013-Cardiology has told her "heart is fine'. Dr Jana Hakim has told her no recurrence of lung Ca Office spirometry 11/28/2014-moderate restriction of exhaled volume and moderate obstruction. FVC 1.75/69%, FEV1 1.26/66%, FEV1/FVC 0.72, FEF 25-75 percent 0.77/47%. Echo 09/17/16-EF 35%-40%, PA pressure 49, IVC dilated consistent with elevated CVP, A. fib PFT 09/17/16-severe obstructive airways disease without response to dilator, severe restriction, severe reduction of diffusion. FVC 1.10/44%, FEV1 0.80/43%, ratio 0.72, TLC 67%, DLCO 25% CT chest HR 09/04/15-. There are findings in the lungs which could indicate interstitial lung disease, such is mild nonspecific interstitial pneumonia  (NSIP). -----------------------------------------------------------------------------------------------  08/22/2015-76 year old female former smoker followed for COPD, chronic respiratory failure with hypoxia, OSA/failed CPAP, history lung CA/XRT/chemotherapy, DVT, a flutter O2 2 L/Advanced FOLLOW FOR: not feeling well.  Wheezing a lot, O2 was 79% on RA when patient arrived, pt states she had been off oxygen for 10 minutes before she was brought back to room, placed on 2L Continuous, increased to 91% on 2L.  Pt states she has no energy.   Came in on room air, desaturating to 79% but wore oxygen driving over here. Not acutely ill. Chronic dyspnea on exertion. We reviewed chest x-ray from October 20 showing diffuse interstitial process, for repeat today. Not much cough, no fever or sweat. Chronic ankle edema. Difficult for her to pull elastic hose on. Working with Dr. Virgia Land  surgery considering repair of thoracic aortic aneurysm. Cardiology follow-up pending  11/13/16- 76 year old female former smoker followed for COPD, chronic respiratory failure with hypoxia, ? ILD versus edema, OSA/failed CPAP, complicated by history lung CA/XRT/chemotherapy, DVT, AFib, chronic diastolic CHF O2 3 L/Advanced  Was offered pulmonary rehabilitation at [facility] 07/14/16, but was going out of town. " no change or worse". Feels better with oxygen at 3 L. Using rescue inhaler twice a day, nebulizer a few times each week. Not much wheeze or cough. Echo 09/17/16-EF 35%-40%, PA pressure 49, IVC dilated consistent with elevated CVP, A. fib PFT 09/17/16-severe obstructive airways disease without response to dilator, severe restriction, severe reduction of diffusion. FVC 1.10/44%, FEV1 0.80/43%, ratio 0.72, TLC 67%, DLCO 25% CT chestHR 09/04/15-  IMPRESSION: 1. There are findings in the lungs which could indicate interstitial lung disease, such is mild nonspecific interstitial pneumonia (NSIP). 2. Mild diffuse bronchial wall thickening with moderate centrilobular and mild paraseptal emphysema, as well as mild air trapping ; imaging findings suggestive of underlying COPD. 3. Atherosclerosis, including left main and 3 vessel coronary artery disease. Assessment for potential risk factor modification, dietary therapy or pharmacologic therapy may be warranted, if clinically indicated. 4. Mild cardiomegaly. CXR 09/15/16 IMPRESSION: 1. Cardiomegaly with a diffuse interstitial pattern suggesting edema and congestive heart failure. Infection is considered less likely. 2. Aortic atherosclerosis.   ROS-see HPI Constitutional:   No-   weight loss, night sweats, fevers, chills, fatigue, lassitude. HEENT:   No-  headaches, difficulty swallowing, tooth/dental problems, sore throat,       No-  sneezing, itching, ear ache, nasal congestion, post nasal drip,  CV:  No-   chest pain, orthopnea, PND, +less  swelling in lower extremities, No-anasarca, dizziness,                     +  Occasional palpitations Resp:    +shortness of breath with exertion or at rest.              No- productive cough,  +non-productive cough,  no-coughing up of blood.              No-   change in color of mucus.  No- wheezing.   Skin: No-   rash or lesions. GI:  No-   heartburn, indigestion, abdominal pain, nausea, vomiting,  GU: N MS:  No-   joint pain or swelling.   Neuro-     nothing unusual Psych:  No- change in mood or affect. No depression or anxiety.  No memory loss.  OBJ- Physical Exam   General- Alert, Oriented, Affect-appropriate, Distress- none, + obese. O2 3L/ Advanced Skin- rash-none, lesions- none, excoriation- none Lymphadenopathy- none Head- atraumatic            Eyes- Gross vision intact, PERRLA, conjunctivae and secretions clear            Ears- Hearing, canals-normal            Nose- Clear, no-Septal dev, mucus, polyps, erosion, perforation             Throat- Mallampati II , mucosa clear , drainage- none, tonsils- atrophic, + dentures Neck- flexible , trachea midline, no stridor , thyroid nl, carotid no bruit Chest - symmetrical excursion , unlabored           Heart/CV- + IRR/ AFib no murmur , no gallop  , no rub, nl s1 s2                           - JVD- none ,  edema +2-3 chronic, + stasis changes, varices- none           Lung- clear, wheeze- none, cough- none , dullness-none, rub- none           Chest wall-  Abd-  Br/ Gen/ Rectal- Not done, not indicated Extrem- cyanosis- none, clubbing, none, atrophy- none, strength- nl,+ Stasis changes with heavy legs Neuro- grossly intact to observation

## 2016-11-16 NOTE — Assessment & Plan Note (Signed)
She has obstructive and restrictive lung defects. Unclear if she has an interstitial lung disease or interstitial edema from her equally significant cardiac disease. She seems to get some benefit from her bronchodilators despite lack of documented response on PFT, so we will continue.

## 2016-11-16 NOTE — Assessment & Plan Note (Signed)
She never wished to try CPAP again.

## 2016-11-16 NOTE — Assessment & Plan Note (Addendum)
She is more comfortable on 3 L. We discussed need to avoid CO2 retention but I think 3 L is appropriate. We did agree to let her try a Stiolto inhaler.

## 2016-11-16 NOTE — Assessment & Plan Note (Signed)
Chronic atrial fibrillation and chronic heart failure with echocardiogram results noted. Followed by cardiology. She continues to have peripheral edema.

## 2016-11-20 NOTE — Progress Notes (Signed)
Cardiology Office Note   Date:  11/21/2016   ID:  [name], DOB 1941-01-02, MRN [id number]  PCP:  Burnard Bunting, MD  Cardiologist:  Omarie Parcell Martin[place], MD   Chief Complaint  Patient presents with  . Shortness of Breath    pt states some SOB and states she is light headed   . Edema    both feet     History of Present Illness: [name] is a 76 y.o. female with a history of persistent AF on Xarelto, D-CHF, COPD on home O2, Anxiety, DVT, HTN, HLD, hypothyroidism, OA, NSC lung CA, OSA, s/p Aobi-iliac stent graft, 4.5 cm thoracic aneurysm (Dr Early), CHAS2DS2VASC=7 (female, age x 2, DVT x 2, CHF, HTN)  She was admitted 03/26>09/28/2016 for rapid afib and CHF, not a candidate for amio w/ lung dz, Tikosyn and Sotalol contraindicated with prolonged QT. Multaq and flecainide are not good choices with CHF. Was managed with rate control. Wt at d/c 241 lbs, HR 90s. 04/17 office visit, pt wt up 4 lbs, Lasix increased short-term, compliance w/ low Na diet. Seen May 22 and weight improved but still volume overloaded. Added metolazone added for 3 days. Seen today to follow up response.   She feels her breathing is not good today. On her home weight charts, her weight is the highest it has been since 04/17 at 247. She does not feel she had excessive food or sodium, although she did eat a small amount of restaurant food last pm.   On follow up today she reports her breathing is slightly better. Leg edema has improved. Notes her weight is coming down. She did get a good response to metolazone. She notes she doesn't feel well at times with lightheadedness and nausea.    Past Medical History:  Diagnosis Date  . AAA (abdominal aortic aneurysm) ([place])   . Anticoagulant long-term use    Failed on Coumadin. On Xarelto  . Anxiety   . Anxiety and depression   . Atrial fib/flutter, transient June 2012  . Chronic lower back pain   . COPD (chronic obstructive pulmonary disease) ([place])   . DVT (deep  venous thrombosis) ([place]) 2004   BLE  . Esophageal dysmotility   . Exertional dyspnea   . Hiatal hernia   . History of bronchitis   . History of fibrocystic disease of breast   . History of uterine fibroid   . HTN (hypertension)   . Hyperlipidemia   . Hypothyroidism   . Normal nuclear stress test 2012   May 2012  . OA (osteoarthritis)    "knees; left shoulder"  . OSA (obstructive sleep apnea)    "haven't been using my CPAP lately" (01/21/12)  . Ovarian mass    right benign  . Ovarian mass    benign, right  . PAD (peripheral artery disease) ([place])   . Small cell carcinoma of lung ([place]) 2004   NON-SMALL CELL CARCINOMA OF THE LUNG, METASTATIC TO THE SUPRACLAVICULAR AND MEDIASTINAL LYMPH NODES; in remission    Past Surgical History:  Procedure Laterality Date  . ABDOMINAL AORTIC ANEURYSM REPAIR  ~ 2010   stent graft  . BLADDER SURGERY  ~ 2003   sling  . BREAST BIOPSY  1960's   both breast's - benign  . CARDIOVASCULAR STRESS TEST  2012   No ischemia  . CATARACT EXTRACTION W/ INTRAOCULAR LENS  IMPLANT, BILATERAL Bilateral ~ 2009  . REPLACEMENT TOTAL KNEE Left ~ 2008   left  .  THYROIDECTOMY  ~ 1964    Current Outpatient Prescriptions  Medication Sig Dispense Refill  . CALCIUM-MAGNESIUM-ZINC PO Take 1 tablet by mouth daily.     . Cholecalciferol (VITAMIN D) 1000 UNITS capsule Take 1,000 Units by mouth daily.      . citalopram (CELEXA) 20 MG tablet Take 20 mg by mouth daily.     . digoxin (LANOXIN) 0.125 MG tablet Take 1 tablet (0.125 mg total) by mouth daily. 90 tablet 2  . furosemide (LASIX) 40 MG tablet Take 2 tablets (80 mg total) by mouth 2 (two) times daily. 360 tablet 2  . levothyroxine (SYNTHROID, LEVOTHROID) 88 MCG tablet Take 88 mcg by mouth daily.    . metolazone (ZAROXOLYN) 2.5 MG tablet 1 DAILY FOR 3 DAYS AND THEN ONE AS NEEDED FOR WEIGHT GAIN 30 tablet 2  . metoprolol (LOPRESSOR) 50 MG tablet Take 1 tablet (50 mg total) by mouth 3 (three) times daily. 90 tablet 6    . morphine (MSIR) 15 MG tablet Take 1 tablet by mouth as needed.    . NON FORMULARY daily. 2 liters oxygen    . OXYGEN Inhale 2 L into the lungs daily.    . potassium chloride (K-DUR) 10 MEQ tablet Take 2 tablets (20 mEq total) by mouth 2 (two) times daily. TAKE AN EXTRA 2 TABLETS (20 MEQ) ON DAY YOU TAKE METOLAZONE 270 tablet 2  . rivaroxaban (XARELTO) 20 MG TABS tablet Take 1 tablet (20 mg total) by mouth daily with supper. 30 tablet 5  . SPIRIVA HANDIHALER 18 MCG inhalation capsule PLACE 1 CAPSULE INTO INHALER AND INHALE ONCE DAILY AS DIRECTED 30 capsule 6  . temazepam (RESTORIL) 15 MG capsule TAKE 1 CAPSULE AT BEDTIME AS NEEDED  FOR  SLEEP 90 capsule 1  . VENTOLIN HFA 108 (90 Base) MCG/ACT inhaler TAKE 2 PUFFS EVERY 6 HOURS AS NEEDED FORSHORTNESS OF BREATH/WHEEZING 18 g 4  . vitamin B-12 (CYANOCOBALAMIN) 100 MCG tablet Take 100 mcg by mouth daily.     No current facility-administered medications for this visit.     Allergies:   Patient has no known allergies.    Social History:  The patient  reports that she quit smoking about 15 years ago. Her smoking use included Cigarettes. She has a 40.00 pack-year smoking history. She has never used smokeless tobacco. She reports that she drinks alcohol. She reports that she does not use drugs.   Family History:  The patient's family history includes Diabetes in her mother; Diabetes type II in her mother; Heart attack in her father; Heart disease in her brother, father, and sister; Hypertension in her mother.    ROS:  Please see the history of present illness. All other systems are reviewed and negative.    PHYSICAL EXAM: VS:  BP 102/64   Pulse 84   Ht 5\' 2"  (1.575 m)   Wt 235 lb (106.6 kg)   SpO2 90%   BMI 42.98 kg/m  , BMI Body mass index is 42.98 kg/m. GEN: Well nourished, obese, female in no acute distress. On Oxygen HEENT: normal for age  Neck: JVD 7 cm, no carotid bruit, no masses Cardiac: RRR; soft murmur, no rubs, or  gallops Respiratory: decreased BS bases. GI: soft, nontender, nondistended, + BS MS: no deformity or atrophy; no  edema; severe brawny induration noted, distal pulses are 1-2+ in all 4 extremities   Skin: warm and dry, no rash Neuro:  Strength and sensation are intact Psych: euthymic mood, full affect  EKG:  EKG is not ordered today.  ECHO: 09/17/2016 - Left ventricle: The cavity size was normal. Wall thickness was increased in a pattern of moderate LVH. Systolic function was moderately reduced. The estimated ejection fraction was in the range of 35% to 40%. Diffuse hypokinesis. The study is not technically sufficient to allow evaluation of LV diastolicfunction. - Aortic valve: Trileaflet. Sclerosis without stenosis. There was no regurgitation. - Mitral valve: Calcified annulus. Mildly thickened leaflets . - Left atrium: The atrium was mildly dilated. - Right ventricle: The cavity size was normal. Mildly decreased RV systolic function. - Right atrium: Moderately dilated. - Tricuspid valve: There was moderate regurgitation. - Pulmonary arteries: PA peak pressure: 49 mm Hg (S). - Inferior vena cava: The vessel was dilated. The respirophasic diameter changes were blunted (<50%), consistent with elevated central venous pressure. Impressions: - Compared to a prior study in 2016, the LVEF is reduced now to 35-40%. A-fib wtih RVR is noted. The ascending aorta meausres 4.0 cm. There is mild AI, mild LAE, moderate RAE, moderate TR and RVSP of 49 mmHg with a dilated IVC.  Recent Labs: 09/15/2016: ALT 22; B Natriuretic Peptide 348.5; TSH 2.813 09/26/2016: Hemoglobin 16.0; Platelets 128 09/28/2016: Magnesium 2.0 11/11/2016: BUN 10; Creatinine, Ser 0.78; Potassium 3.7; Sodium 140  09/26/16; Dig level 0.6  Lipid Panel No results found for: CHOL, TRIG, HDL, CHOLHDL, VLDL, LDLCALC, LDLDIRECT   Wt Readings from Last 3 Encounters:  11/21/16 235 lb (106.6 kg)  11/13/16  237 lb 6.4 oz (107.7 kg)  11/11/16 249 lb (112.9 kg)     Other studies Reviewed: Labs 12/10/15: cholesterol 136, triglycerides 82, HDL 38, LDL 82.   ASSESSMENT AND PLAN:  1.  Chronic diastolic CHF: Her weight improved and her swelling is down. I think we are close to her dry weight. Reinforced low sodium diet and fluid restriction. Recommend she weigh daily. If weight increases 2-3 lbs in a day or 5 lbs in a week then she is to take metolazone until weight returns to baseline.   2. COPD: on home Oxygen.  3.  Persistent atrial fibrillation: Her heart rate is well controlled today. She is on Xarelto. She is not having any bleeding issues. Partly because of her respiratory issues, I do not feel that she would maintain sinus rhythm so we will continue with the strategy of rate control and anticoagulation.   Current medicines are reviewed at length with the patient today.  The patient does not have concerns regarding medicines.  The following changes have been made:  Add metolazone for 3 days and when necessary, increase potassium    Labs/ tests ordered today include:  No orders of the defined types were placed in this encounter.    Disposition:   FU with Dr. Martinique 3 months with BMET and BNP  Signed, Charlise Giovanetti [place], MD  11/21/2016 11:14 AM    [place] Phone: [phone]; Fax: [phone]  This note was written with the assistance of speech recognition software. Please excuse any transcriptional errors.

## 2016-11-21 ENCOUNTER — Encounter: Payer: Self-pay | Admitting: Cardiology

## 2016-11-21 ENCOUNTER — Ambulatory Visit (INDEPENDENT_AMBULATORY_CARE_PROVIDER_SITE_OTHER): Payer: Medicare Other | Admitting: Cardiology

## 2016-11-21 VITALS — BP 102/64 | HR 84 | Ht 62.0 in | Wt 235.0 lb

## 2016-11-21 DIAGNOSIS — J9611 Chronic respiratory failure with hypoxia: Secondary | ICD-10-CM

## 2016-11-21 DIAGNOSIS — I4819 Other persistent atrial fibrillation: Secondary | ICD-10-CM

## 2016-11-21 DIAGNOSIS — I5032 Chronic diastolic (congestive) heart failure: Secondary | ICD-10-CM

## 2016-11-21 DIAGNOSIS — I481 Persistent atrial fibrillation: Secondary | ICD-10-CM

## 2016-11-21 DIAGNOSIS — I10 Essential (primary) hypertension: Secondary | ICD-10-CM | POA: Diagnosis not present

## 2016-11-21 NOTE — Patient Instructions (Signed)
Continue your current therapy  Weigh yourself daily. If you note a 2-3 lb weight gain in one day or 5 lbs in one week then take Metolazone 2.5 mg po daily until weight returns to current level.  I will see you in 3 months.

## 2016-11-24 ENCOUNTER — Ambulatory Visit: Payer: Medicare Other | Admitting: Dietician

## 2016-12-25 DIAGNOSIS — R8290 Unspecified abnormal findings in urine: Secondary | ICD-10-CM | POA: Diagnosis not present

## 2016-12-25 DIAGNOSIS — R358 Other polyuria: Secondary | ICD-10-CM | POA: Diagnosis not present

## 2016-12-25 DIAGNOSIS — E784 Other hyperlipidemia: Secondary | ICD-10-CM | POA: Diagnosis not present

## 2016-12-25 DIAGNOSIS — E038 Other specified hypothyroidism: Secondary | ICD-10-CM | POA: Diagnosis not present

## 2016-12-25 DIAGNOSIS — I10 Essential (primary) hypertension: Secondary | ICD-10-CM | POA: Diagnosis not present

## 2016-12-31 DIAGNOSIS — Z1212 Encounter for screening for malignant neoplasm of rectum: Secondary | ICD-10-CM | POA: Diagnosis not present

## 2017-01-01 ENCOUNTER — Other Ambulatory Visit: Payer: Self-pay | Admitting: Internal Medicine

## 2017-01-01 DIAGNOSIS — E041 Nontoxic single thyroid nodule: Secondary | ICD-10-CM

## 2017-01-01 DIAGNOSIS — R1310 Dysphagia, unspecified: Secondary | ICD-10-CM

## 2017-01-09 ENCOUNTER — Ambulatory Visit
Admission: RE | Admit: 2017-01-09 | Discharge: 2017-01-09 | Disposition: A | Discharge status: Home / Self Care | Payer: Medicare Other | Source: Ambulatory Visit | Attending: Internal Medicine | Admitting: Internal Medicine

## 2017-01-09 DIAGNOSIS — R1310 Dysphagia, unspecified: Secondary | ICD-10-CM

## 2017-01-09 DIAGNOSIS — E041 Nontoxic single thyroid nodule: Secondary | ICD-10-CM

## 2017-01-09 DIAGNOSIS — E042 Nontoxic multinodular goiter: Secondary | ICD-10-CM | POA: Diagnosis not present

## 2017-02-06 ENCOUNTER — Other Ambulatory Visit: Payer: Self-pay | Admitting: Internal Medicine

## 2017-02-06 DIAGNOSIS — Z1231 Encounter for screening mammogram for malignant neoplasm of breast: Secondary | ICD-10-CM

## 2017-02-20 DIAGNOSIS — I5032 Chronic diastolic (congestive) heart failure: Secondary | ICD-10-CM | POA: Diagnosis not present

## 2017-02-20 DIAGNOSIS — I10 Essential (primary) hypertension: Secondary | ICD-10-CM | POA: Diagnosis not present

## 2017-02-20 DIAGNOSIS — I481 Persistent atrial fibrillation: Secondary | ICD-10-CM | POA: Diagnosis not present

## 2017-02-20 DIAGNOSIS — J9611 Chronic respiratory failure with hypoxia: Secondary | ICD-10-CM | POA: Diagnosis not present

## 2017-02-21 LAB — BASIC METABOLIC PANEL
BUN/Creatinine Ratio: 13 (ref 12–28)
BUN: 10 mg/dL (ref 8–27)
CO2: 29 mmol/L (ref 20–29)
Calcium: 9.6 mg/dL (ref 8.7–10.3)
Chloride: 99 mmol/L (ref 96–106)
Creatinine, Ser: 0.78 mg/dL (ref 0.57–1.00)
GFR calc Af Amer: 84 mL/min/{1.73_m2} (ref >59)
GFR calc non Af Amer: 73 mL/min/{1.73_m2} (ref >59)
Glucose: 87 mg/dL (ref 65–99)
Potassium: 4.5 mmol/L (ref 3.5–5.2)
Sodium: 139 mmol/L (ref 134–144)

## 2017-02-21 LAB — BRAIN NATRIURETIC PEPTIDE: BNP: 169.5 pg/mL — ABNORMAL HIGH (ref 0.0–100.0)

## 2017-02-22 NOTE — Progress Notes (Signed)
Cardiology Office Note   Date:  02/25/2017   ID:  [name], DOB [birth year], MRN [id number]  PCP:  Burnard Bunting, MD  Cardiologist:  Rosamary Boudreau Martin[place], MD   Chief Complaint  Patient presents with  . Follow-up    no chest pain, frequently shortness of breath and edema, has pain or cramping in legs, occassional lightheadedness  . Congestive Heart Failure    History of Present Illness: [name] is a 76 y.o. female with a history of persistent AF on Xarelto, D-CHF, COPD on home O2, Anxiety, DVT, HTN, HLD, hypothyroidism, OA, NSC lung CA, OSA, s/p Aobi-iliac stent graft, 4.5 cm thoracic aneurysm (Dr Early), CHAS2DS2VASC=7 (female, age x 2, DVT x 2, CHF, HTN)  She was admitted 03/26>09/28/2016 for rapid afib and CHF, not a candidate for amio w/ lung dz, Tikosyn and Sotalol contraindicated with prolonged QT. Multaq and flecainide are not good choices with CHF. Was managed with rate control. Wt at d/c 241 lbs, HR 90s. 04/17 office visit, pt wt up 4 lbs, Lasix increased short-term, compliance w/ low Na diet. Seen May 22 and weight improved but still volume overloaded. Added metolazone added for 3 days with excellent response.    On follow up today she reports her breathing is better with weight loss. Weight is down 11 lbs since May.  Leg edema has improved. Has only used metolazone twice since June. Notes lipitor was stopped by Dr. Reynaldo Minium due to severe sluggishness and myalgias.    Past Medical History:  Diagnosis Date  . AAA (abdominal aortic aneurysm) ([place])   . Anticoagulant long-term use    Failed on Coumadin. On Xarelto  . Anxiety   . Anxiety and depression   . Atrial fib/flutter, transient June 2012  . Chronic lower back pain   . COPD (chronic obstructive pulmonary disease) ([place])   . DVT (deep venous thrombosis) ([place]) 2004   BLE  . Esophageal dysmotility   . Exertional dyspnea   . Hiatal hernia   . History of bronchitis   . History of fibrocystic disease of breast   .  History of uterine fibroid   . HTN (hypertension)   . Hyperlipidemia   . Hypothyroidism   . Normal nuclear stress test 2012   May 2012  . OA (osteoarthritis)    "knees; left shoulder"  . OSA (obstructive sleep apnea)    "haven't been using my CPAP lately" (01/21/12)  . Ovarian mass    right benign  . Ovarian mass    benign, right  . PAD (peripheral artery disease) ([place])   . Small cell carcinoma of lung ([place]) 2004   NON-SMALL CELL CARCINOMA OF THE LUNG, METASTATIC TO THE SUPRACLAVICULAR AND MEDIASTINAL LYMPH NODES; in remission    Past Surgical History:  Procedure Laterality Date  . ABDOMINAL AORTIC ANEURYSM REPAIR  ~ 2010   stent graft  . BLADDER SURGERY  ~ 2003   sling  . BREAST BIOPSY  1960's   both breast's - benign  . CARDIOVASCULAR STRESS TEST  2012   No ischemia  . CATARACT EXTRACTION W/ INTRAOCULAR LENS  IMPLANT, BILATERAL Bilateral ~ 2009  . REPLACEMENT TOTAL KNEE Left ~ 2008   left  . THYROIDECTOMY  ~ 1964    Current Outpatient Prescriptions  Medication Sig Dispense Refill  . CALCIUM-MAGNESIUM-ZINC PO Take 1 tablet by mouth daily.     . Cholecalciferol (VITAMIN D) 1000 UNITS capsule Take 1,000 Units by mouth daily.      Marland Kitchen  citalopram (CELEXA) 20 MG tablet Take 20 mg by mouth daily.     . digoxin (LANOXIN) 0.125 MG tablet Take 1 tablet (0.125 mg total) by mouth daily. 90 tablet 2  . furosemide (LASIX) 40 MG tablet Take 2 tablets (80 mg total) by mouth 2 (two) times daily. 360 tablet 2  . levothyroxine (SYNTHROID, LEVOTHROID) 88 MCG tablet Take 88 mcg by mouth daily.    . metolazone (ZAROXOLYN) 2.5 MG tablet 1 DAILY FOR 3 DAYS AND THEN ONE AS NEEDED FOR WEIGHT GAIN 30 tablet 2  . metoprolol tartrate (LOPRESSOR) 50 MG tablet Take 1 tablet (50 mg total) by mouth 3 (three) times daily. 270 tablet 3  . morphine (MSIR) 15 MG tablet Take 1 tablet by mouth as needed.    . NON FORMULARY daily. 2 liters oxygen    . OXYGEN Inhale 2 L into the lungs daily.    . potassium  chloride (K-DUR) 10 MEQ tablet Take 2 tablets (20 mEq total) by mouth 2 (two) times daily. TAKE AN EXTRA 2 TABLETS (20 MEQ) ON DAY YOU TAKE METOLAZONE 270 tablet 2  . rivaroxaban (XARELTO) 20 MG TABS tablet Take 1 tablet (20 mg total) by mouth daily with supper. 30 tablet 5  . SPIRIVA HANDIHALER 18 MCG inhalation capsule PLACE 1 CAPSULE INTO INHALER AND INHALE ONCE DAILY AS DIRECTED 30 capsule 6  . temazepam (RESTORIL) 15 MG capsule TAKE 1 CAPSULE AT BEDTIME AS NEEDED  FOR  SLEEP 90 capsule 1  . VENTOLIN HFA 108 (90 Base) MCG/ACT inhaler TAKE 2 PUFFS EVERY 6 HOURS AS NEEDED FORSHORTNESS OF BREATH/WHEEZING 18 g 4  . vitamin B-12 (CYANOCOBALAMIN) 100 MCG tablet Take 100 mcg by mouth daily.     No current facility-administered medications for this visit.     Allergies:   Patient has no known allergies.    Social History:  The patient  reports that she quit smoking about 15 years ago. Her smoking use included Cigarettes. She has a 40.00 pack-year smoking history. She has never used smokeless tobacco. She reports that she drinks alcohol. She reports that she does not use drugs.   Family History:  The patient's family history includes Diabetes in her mother; Diabetes type II in her mother; Heart attack in her father; Heart disease in her brother, father, and sister; Hypertension in her mother.    ROS:  Please see the history of present illness. All other systems are reviewed and negative.    PHYSICAL EXAM: VS:  BP 108/62   Pulse (!) 52   Ht 5\' 2"  (1.575 m)   Wt 226 lb (102.5 kg)   BMI 41.34 kg/m  , BMI Body mass index is 41.34 kg/m. GEN: Well nourished, obese, female in no acute distress. On Oxygen HEENT: normal for age  Neck: JVD 7 cm, no carotid bruit, no masses Cardiac: RRR; soft murmur, no rubs, or gallops Respiratory: decreased BS bases. GI: soft, nontender, nondistended, + BS MS: no deformity or atrophy; no  edema; severe brawny induration noted, distal pulses are 1-2+ in lower  extremities  Skin: warm and dry, no rash Neuro:  Strength and sensation are intact Psych: euthymic mood, full affect   EKG:  EKG is not ordered today.  ECHO: 09/17/2016 - Left ventricle: The cavity size was normal. Wall thickness was increased in a pattern of moderate LVH. Systolic function was moderately reduced. The estimated ejection fraction was in the range of 35% to 40%. Diffuse hypokinesis. The study is not technically  sufficient to allow evaluation of LV diastolicfunction. - Aortic valve: Trileaflet. Sclerosis without stenosis. There was no regurgitation. - Mitral valve: Calcified annulus. Mildly thickened leaflets . - Left atrium: The atrium was mildly dilated. - Right ventricle: The cavity size was normal. Mildly decreased RV systolic function. - Right atrium: Moderately dilated. - Tricuspid valve: There was moderate regurgitation. - Pulmonary arteries: PA peak pressure: 49 mm Hg (S). - Inferior vena cava: The vessel was dilated. The respirophasic diameter changes were blunted (<50%), consistent with elevated central venous pressure. Impressions: - Compared to a prior study in 2016, the LVEF is reduced now to 35-40%. A-fib wtih RVR is noted. The ascending aorta meausres 4.0 cm. There is mild AI, mild LAE, moderate RAE, moderate TR and RVSP of 49 mmHg with a dilated IVC.  Recent Labs: 09/15/2016: ALT 22; TSH 2.813 09/26/2016: Hemoglobin 16.0; Platelets 128 09/28/2016: Magnesium 2.0 02/20/2017: BNP 169.5; BUN 10; Creatinine, Ser 0.78; Potassium 4.5; Sodium 139  09/26/16; Dig level 0.6  Lipid Panel No results found for: CHOL, TRIG, HDL, CHOLHDL, VLDL, LDLCALC, LDLDIRECT   Wt Readings from Last 3 Encounters:  02/25/17 226 lb (102.5 kg)  11/21/16 235 lb (106.6 kg)  11/13/16 237 lb 6.4 oz (107.7 kg)     Other studies Reviewed: Labs 12/10/15: cholesterol 136, triglycerides 82, HDL 38, LDL 82.  Dated 12/25/16: cholesterol 182, triglycerides 156, HDL  27, LDL 124. Chemistries and TSH normal.   ASSESSMENT AND PLAN:  1.  Chronic diastolic CHF: Her weight improved and her swelling is down. I think we are at her dry weight. BNP level has improved.  Reinforced low sodium diet and fluid restriction. Recommend she weigh daily. If weight increases 2-3 lbs in a day or 5 lbs in a week then she is to take metolazone until weight returns to baseline.   2. COPD: on home Oxygen.  3.  Persistent atrial fibrillation: Her heart rate is well controlled today. She is on Xarelto. She is not having any bleeding issues. Continue with the strategy of rate control and anticoagulation.  4. Hypercholesterolemia. Mild. Intolerant of statin.   Current medicines are reviewed at length with the patient today.  The patient does not have concerns regarding medicines.   Labs/ tests ordered today include:  No orders of the defined types were placed in this encounter.    Disposition:   FU 6 months.  Signed, Chanee Henrickson [place], MD  02/25/2017 11:56 AM    [place] Phone: [phone]; Fax: [phone]  This note was written with the assistance of speech recognition software. Please excuse any transcriptional errors.

## 2017-02-25 ENCOUNTER — Ambulatory Visit (INDEPENDENT_AMBULATORY_CARE_PROVIDER_SITE_OTHER): Payer: Medicare Other | Admitting: Cardiology

## 2017-02-25 ENCOUNTER — Encounter: Payer: Self-pay | Admitting: Cardiology

## 2017-02-25 VITALS — BP 108/62 | HR 52 | Ht 62.0 in | Wt 226.0 lb

## 2017-02-25 DIAGNOSIS — I4819 Other persistent atrial fibrillation: Secondary | ICD-10-CM

## 2017-02-25 DIAGNOSIS — I5032 Chronic diastolic (congestive) heart failure: Secondary | ICD-10-CM | POA: Diagnosis not present

## 2017-02-25 DIAGNOSIS — I481 Persistent atrial fibrillation: Secondary | ICD-10-CM

## 2017-02-25 DIAGNOSIS — J9611 Chronic respiratory failure with hypoxia: Secondary | ICD-10-CM

## 2017-02-25 MED ORDER — METOPROLOL TARTRATE 50 MG PO TABS: 50.0000 mg | ORAL_TABLET | Freq: Three times a day (TID) | ORAL | 3 refills | Status: DC

## 2017-02-25 MED ORDER — FUROSEMIDE 40 MG PO TABS: 80.0000 mg | ORAL_TABLET | Freq: Two times a day (BID) | ORAL | 2 refills | Status: DC

## 2017-02-25 MED ORDER — METOLAZONE 2.5 MG PO TABS: ORAL_TABLET | ORAL | 2 refills | Status: DC

## 2017-02-25 NOTE — Patient Instructions (Signed)
Continue your current therapy  You may use metolazone prn for increased swelling or weight gain.  I will see you in 6 months

## 2017-02-27 ENCOUNTER — Telehealth: Payer: Self-pay | Admitting: Cardiology

## 2017-02-27 NOTE — Telephone Encounter (Signed)
New message   Pt returning call to cheryl

## 2017-02-27 NOTE — Telephone Encounter (Signed)
Returned call to patient lab results given. 

## 2017-03-01 ENCOUNTER — Other Ambulatory Visit: Payer: Self-pay | Admitting: Internal Medicine

## 2017-03-23 ENCOUNTER — Ambulatory Visit
Admission: RE | Admit: 2017-03-23 | Discharge: 2017-03-23 | Disposition: A | Discharge status: Home / Self Care | Payer: Medicare Other | Source: Ambulatory Visit | Attending: Internal Medicine | Admitting: Internal Medicine

## 2017-03-23 DIAGNOSIS — Z23 Encounter for immunization: Secondary | ICD-10-CM | POA: Diagnosis not present

## 2017-03-23 DIAGNOSIS — Z1231 Encounter for screening mammogram for malignant neoplasm of breast: Secondary | ICD-10-CM

## 2017-03-24 ENCOUNTER — Ambulatory Visit: Payer: Medicare Other | Admitting: Vascular Surgery

## 2017-03-25 ENCOUNTER — Other Ambulatory Visit: Payer: Self-pay | Admitting: Internal Medicine

## 2017-03-25 DIAGNOSIS — R928 Other abnormal and inconclusive findings on diagnostic imaging of breast: Secondary | ICD-10-CM

## 2017-04-06 ENCOUNTER — Ambulatory Visit
Admission: RE | Admit: 2017-04-06 | Discharge: 2017-04-06 | Disposition: A | Discharge status: Home / Self Care | Payer: Medicare Other | Source: Ambulatory Visit | Attending: Internal Medicine | Admitting: Internal Medicine

## 2017-04-06 DIAGNOSIS — R928 Other abnormal and inconclusive findings on diagnostic imaging of breast: Secondary | ICD-10-CM

## 2017-04-06 DIAGNOSIS — N6002 Solitary cyst of left breast: Secondary | ICD-10-CM | POA: Diagnosis not present

## 2017-04-28 DIAGNOSIS — R040 Epistaxis: Secondary | ICD-10-CM | POA: Diagnosis not present

## 2017-04-28 DIAGNOSIS — J340 Abscess, furuncle and carbuncle of nose: Secondary | ICD-10-CM | POA: Diagnosis not present

## 2017-05-04 ENCOUNTER — Ambulatory Visit (INDEPENDENT_AMBULATORY_CARE_PROVIDER_SITE_OTHER): Payer: Medicare Other | Admitting: Internal Medicine

## 2017-05-04 ENCOUNTER — Encounter: Payer: Self-pay | Admitting: Internal Medicine

## 2017-05-04 DIAGNOSIS — I5043 Acute on chronic combined systolic (congestive) and diastolic (congestive) heart failure: Secondary | ICD-10-CM

## 2017-05-04 DIAGNOSIS — J9611 Chronic respiratory failure with hypoxia: Secondary | ICD-10-CM | POA: Diagnosis not present

## 2017-05-04 DIAGNOSIS — J449 Chronic obstructive pulmonary disease, unspecified: Secondary | ICD-10-CM

## 2017-05-04 NOTE — Patient Instructions (Signed)
We can continue present meds and O2  Order- [facility]- please add humidity to home concentrator to reduce nosebleeds from over-drying  Please call if we can help
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
## 2017-05-06 NOTE — Assessment & Plan Note (Signed)
Ankle edema is less today although chronic stasis changes remain.  She is followed by cardiology.

## 2017-05-06 NOTE — Assessment & Plan Note (Signed)
Renewed qualification for home O2 by demonstrating desaturation as she came in the door on room air today. Plan-we are asking her DME to add humidity to her home concentrator to help with nosebleeds.

## 2017-05-06 NOTE — Assessment & Plan Note (Signed)
Stable severe COPD.  No acute change.  Changes on CT scan in 2017 most consistent with interstitial edema given her CHF.  She does not recognize change so we will wait on repeat CXR.  We want a quick trip back to her car so she can put her on oxygen back on.

## 2017-05-26 ENCOUNTER — Ambulatory Visit (INDEPENDENT_AMBULATORY_CARE_PROVIDER_SITE_OTHER): Payer: Medicare Other | Admitting: Vascular Surgery

## 2017-05-26 ENCOUNTER — Encounter: Payer: Self-pay | Admitting: Vascular Surgery

## 2017-05-26 ENCOUNTER — Ambulatory Visit
Admission: RE | Admit: 2017-05-26 | Discharge: 2017-05-26 | Disposition: A | Discharge status: Home / Self Care | Payer: Medicare Other | Source: Ambulatory Visit | Attending: Vascular Surgery | Admitting: Vascular Surgery

## 2017-05-26 VITALS — BP 119/62 | HR 57 | Temp 97.6°F | Resp 18 | Ht 62.0 in | Wt 223.0 lb

## 2017-05-26 DIAGNOSIS — I712 Thoracic aortic aneurysm, without rupture, unspecified: Secondary | ICD-10-CM

## 2017-05-26 DIAGNOSIS — I714 Abdominal aortic aneurysm, without rupture, unspecified: Secondary | ICD-10-CM

## 2017-05-26 MED ORDER — IOPAMIDOL (ISOVUE-370) INJECTION 76%
100.0000 mL | Freq: Once | INTRAVENOUS | Status: AC | PRN
  Administered 2017-05-26: 100 mL via INTRAVENOUS

## 2017-05-26 NOTE — Progress Notes (Signed)
[facility]  Patient name: [name] MRN: [id number] DOB: 1940/09/04 Sex: female  REASON FOR VISIT: Follow-up saccular thoracic aneurysm and prior stent graft repair of infrarenal abdominal aortic aneurysm  HPI: [name] is a 76 y.o. female here for follow-up.  She is doing well.  Does have her usual baseline pulmonary dysfunction.  No symptoms referable to her aneurysmal disease. No new cardiac difficulties. Past Medical History:  Diagnosis Date  . AAA (abdominal aortic aneurysm) ([place])   . Anticoagulant long-term use    Failed on Coumadin. On Xarelto  . Anxiety   . Anxiety and depression   . Atrial fib/flutter, transient June 2012  . Chronic lower back pain   . COPD (chronic obstructive pulmonary disease) ([place])   . DVT (deep venous thrombosis) ([place]) 2004   BLE  . Esophageal dysmotility   . Exertional dyspnea   . Hiatal hernia   . History of bronchitis   . History of fibrocystic disease of breast   . History of uterine fibroid   . HTN (hypertension)   . Hyperlipidemia   . Hypothyroidism   . Normal nuclear stress test 2012   May 2012  . OA (osteoarthritis)    "knees; left shoulder"  . OSA (obstructive sleep apnea)    "haven't been using my CPAP lately" (01/21/12)  . Ovarian mass    right benign  . Ovarian mass    benign, right  . PAD (peripheral artery disease) ([place])   . Small cell carcinoma of lung ([place]) 2004   NON-SMALL CELL CARCINOMA OF THE LUNG, METASTATIC TO THE SUPRACLAVICULAR AND MEDIASTINAL LYMPH NODES; in remission    Family History  Problem Relation Age of Onset  . Heart disease Father   . Heart attack Father   . Diabetes type II Mother   . Diabetes Mother   . Hypertension Mother   . Heart disease Brother        before age 65  . Heart disease Sister        See's Dr. Acie Fredrickson  . Breast cancer Neg Hx     SOCIAL HISTORY: Social History   Tobacco Use  . Smoking status: Former  Smoker    Packs/day: 1.00    Years: 40.00    Pack years: 40.00    Types: Cigarettes    Last attempt to quit: 06/23/2001    Years since quitting: 15.9  . Smokeless tobacco: Never Used  Substance Use Topics  . Alcohol use: Yes    Alcohol/week: 0.0 oz    Comment: once a week (glass of wine)    No Known Allergies  Current Outpatient Medications  Medication Sig Dispense Refill  . CALCIUM-MAGNESIUM-ZINC PO Take 1 tablet by mouth daily.     . Cholecalciferol (VITAMIN D) 1000 UNITS capsule Take 1,000 Units by mouth daily.      . citalopram (CELEXA) 20 MG tablet Take 20 mg by mouth daily.     . digoxin (LANOXIN) 0.125 MG tablet Take 1 tablet (0.125 mg total) by mouth daily. 90 tablet 2  . furosemide (LASIX) 40 MG tablet Take 2 tablets (80 mg total) by mouth 2 (two) times daily. 360 tablet 2  . levothyroxine (SYNTHROID, LEVOTHROID) 88 MCG tablet Take 88 mcg by mouth daily.    . metolazone (ZAROXOLYN) 2.5 MG tablet 1 DAILY FOR 3 DAYS AND THEN ONE AS NEEDED FOR WEIGHT GAIN 30 tablet 2  . metoprolol tartrate (LOPRESSOR) 50 MG tablet Take  1 tablet (50 mg total) by mouth 3 (three) times daily. 270 tablet 3  . morphine (MSIR) 15 MG tablet Take 1 tablet by mouth as needed.    . NON FORMULARY daily. 2 liters oxygen    . OXYGEN Inhale 2 L into the lungs daily.    . potassium chloride (K-DUR) 10 MEQ tablet Take 2 tablets (20 mEq total) by mouth 2 (two) times daily. TAKE AN EXTRA 2 TABLETS (20 MEQ) ON DAY YOU TAKE METOLAZONE 270 tablet 2  . rivaroxaban (XARELTO) 20 MG TABS tablet Take 1 tablet (20 mg total) by mouth daily with supper. 30 tablet 5  . SPIRIVA HANDIHALER 18 MCG inhalation capsule PLACE 1 CAPSULE INTO INHALER AND INHALE ONCE DAILY AS DIRECTED 30 capsule 2  . temazepam (RESTORIL) 15 MG capsule TAKE 1 CAPSULE AT BEDTIME AS NEEDED  FOR  SLEEP 90 capsule 1  . VENTOLIN HFA 108 (90 Base) MCG/ACT inhaler TAKE 2 PUFFS EVERY 6 HOURS AS NEEDED FORSHORTNESS OF BREATH/WHEEZING 18 g 4  . vitamin B-12  (CYANOCOBALAMIN) 100 MCG tablet Take 100 mcg by mouth daily.     No current facility-administered medications for this visit.     REVIEW OF SYSTEMS:  [X]  denotes positive finding, [ ]  denotes negative finding Cardiac  Comments:  Chest pain or chest pressure:    Shortness of breath upon exertion: x   Short of breath when lying flat: x   Irregular heart rhythm:        Vascular    Pain in calf, thigh, or hip brought on by ambulation:    Pain in feet at night that wakes you up from your sleep:     Blood clot in your veins:    Leg swelling:           PHYSICAL EXAM: Vitals:   05/26/17 1118  BP: 119/62  Pulse: (!) 57  Resp: 18  Temp: 97.6 F (36.4 C)  TempSrc: Oral  SpO2: 90%  Weight: 223 lb (101.2 kg)  Height: 5\' 2"  (1.575 m)    GENERAL: The patient is a well-nourished female, in no acute distress. The vital signs are documented above. CARDIOVASCULAR: 2+ radial pulses bilaterally.  Carotid arteries without bruits by bilaterally. PULMONARY: There is good air exchange  MUSCULOSKELETAL: There are no major deformities or cyanosis. NEUROLOGIC: No focal weakness or paresthesias are detected. SKIN: There are no ulcers or rashes noted. PSYCHIATRIC: The patient has a normal affect.  DATA:  Did undergo CT scan of her chest abdomen and pelvis for follow-up.  This shows no change in her thoracic saccular aneurysm.  Maximal size of the saccular aneurysm is approximately 3 cm.  Maximal diameter of her thoracic aorta at this level including her normal aorta her.  Her aortic stent graft shows no evidence of endoleak and no evidence aneurysm sac  MEDICAL ISSUES: Stable overall.  I did explain that she did have a slight risk of rupture of her thoracic aortic aneurysm but the slight risk was far less than risk for repair.  She has had no change in 1 year.  With repeat CT scan in 1 year to rule out any thoracic aneurysm.    Rosetta Posner, MD FACS [facility] Tel [phone] Pager [phone]

## 2017-06-26 ENCOUNTER — Other Ambulatory Visit: Payer: Self-pay | Admitting: Cardiology

## 2017-06-26 ENCOUNTER — Telehealth: Payer: Self-pay | Admitting: Cardiology

## 2017-06-26 NOTE — Telephone Encounter (Signed)
Returned call to patient.Xarelto 20 mg samples left at front desk.

## 2017-06-26 NOTE — Telephone Encounter (Signed)
New message   Patient calling the office for samples of medication:   1.  What medication and dosage are you requesting samples for?rivaroxaban (XARELTO) 20 MG TABS tablet  2.  Are you currently out of this medication?  2 days

## 2017-07-01 ENCOUNTER — Other Ambulatory Visit: Payer: Self-pay | Admitting: Physician Assistant

## 2017-07-20 DIAGNOSIS — H52201 Unspecified astigmatism, right eye: Secondary | ICD-10-CM | POA: Diagnosis not present

## 2017-07-20 DIAGNOSIS — Z961 Presence of intraocular lens: Secondary | ICD-10-CM | POA: Diagnosis not present

## 2017-07-20 DIAGNOSIS — H53002 Unspecified amblyopia, left eye: Secondary | ICD-10-CM | POA: Diagnosis not present

## 2017-08-30 NOTE — Progress Notes (Signed)
Cardiology Office Note   Date:  08/31/2017   ID:  [name], DOB [birth year], MRN [id number]  PCP:  Burnard Bunting, MD  Cardiologist:  Londell Noll Martinique, MD   Chief Complaint  Patient presents with  . Follow-up    6 months  . Atrial Fibrillation    History of Present Illness: [name] is a 77 y.o. female with a history of persistent AF on Xarelto, D-CHF, COPD on home O2, Anxiety, DVT, HTN, HLD, hypothyroidism, OA, NSC lung CA, OSA, s/p Aobi-iliac stent graft, 4.5 cm thoracic aneurysm (Dr Early), CHAS2DS2VASC=7 (female, age x 2, DVT x 2, CHF, HTN)  She was admitted 03/26-04/01/2017 for rapid afib and CHF, not a candidate for amio w/ lung dz, Tikosyn and Sotalol contraindicated with prolonged QT. Multaq and flecainide are not good choices with CHF. Was managed with rate control and diuresis.  Seen in December by Dr. Donnetta Hutching. CT done showing stable saccular aneurysm size.  On follow up today she reports her breathing is stable. Weight is stable. Denies any increase in edema. Has decreased lasix to once a day and hasn't had to use metolazone.  Only felt heart racing once or twice and lasted only a few minutes.    Past Medical History:  Diagnosis Date  . AAA (abdominal aortic aneurysm) ([place])   . Anticoagulant long-term use    Failed on Coumadin. On Xarelto  . Anxiety   . Anxiety and depression   . Atrial fib/flutter, transient June 2012  . Chronic lower back pain   . COPD (chronic obstructive pulmonary disease) ([place])   . DVT (deep venous thrombosis) ([place]) 2004   BLE  . Esophageal dysmotility   . Exertional dyspnea   . Hiatal hernia   . History of bronchitis   . History of fibrocystic disease of breast   . History of uterine fibroid   . HTN (hypertension)   . Hyperlipidemia   . Hypothyroidism   . Normal nuclear stress test 2012   May 2012  . OA (osteoarthritis)    "knees; left shoulder"  . OSA (obstructive sleep apnea)    "haven't been using my CPAP lately"  (01/21/12)  . Ovarian mass    right benign  . Ovarian mass    benign, right  . PAD (peripheral artery disease) ([place])   . Small cell carcinoma of lung ([place]) 2004   NON-SMALL CELL CARCINOMA OF THE LUNG, METASTATIC TO THE SUPRACLAVICULAR AND MEDIASTINAL LYMPH NODES; in remission    Past Surgical History:  Procedure Laterality Date  . ABDOMINAL AORTIC ANEURYSM REPAIR  ~ 2010   stent graft  . BLADDER SURGERY  ~ 2003   sling  . BREAST BIOPSY  1960's   both breast's - benign  . CARDIOVASCULAR STRESS TEST  2012   No ischemia  . CATARACT EXTRACTION W/ INTRAOCULAR LENS  IMPLANT, BILATERAL Bilateral ~ 2009  . REPLACEMENT TOTAL KNEE Left ~ 2008   left  . THYROIDECTOMY  ~ 1964    Current Outpatient Medications  Medication Sig Dispense Refill  . CALCIUM-MAGNESIUM-ZINC PO Take 1 tablet by mouth daily.     . Cholecalciferol (VITAMIN D) 1000 UNITS capsule Take 1,000 Units by mouth daily.      . citalopram (CELEXA) 20 MG tablet Take 20 mg by mouth daily.     . digoxin (LANOXIN) 0.125 MG tablet TAKE 1 TABLET (0.125 MG TOTAL) BY MOUTH DAILY. 90 tablet 2  . furosemide (LASIX) 40 MG tablet Take 2 tablets (  80 mg total) by mouth 2 (two) times daily. 360 tablet 2  . levothyroxine (SYNTHROID, LEVOTHROID) 88 MCG tablet Take 88 mcg by mouth daily.    . metolazone (ZAROXOLYN) 2.5 MG tablet 1 DAILY FOR 3 DAYS AND THEN ONE AS NEEDED FOR WEIGHT GAIN 30 tablet 2  . metoprolol tartrate (LOPRESSOR) 50 MG tablet Take 1 tablet (50 mg total) by mouth 3 (three) times daily. 270 tablet 3  . morphine (MSIR) 15 MG tablet Take 1 tablet by mouth as needed.    . NON FORMULARY daily. 2 liters oxygen    . OXYGEN Inhale 2 L into the lungs daily.    . potassium chloride (K-DUR) 10 MEQ tablet Take 2 tablets (20 mEq total) by mouth 2 (two) times daily. TAKE AN EXTRA 2 TABLETS (20 MEQ) ON DAY YOU TAKE METOLAZONE 270 tablet 2  . SPIRIVA HANDIHALER 18 MCG inhalation capsule PLACE 1 CAPSULE INTO INHALER AND INHALE ONCE DAILY AS  DIRECTED 30 capsule 2  . temazepam (RESTORIL) 15 MG capsule TAKE 1 CAPSULE AT BEDTIME AS NEEDED  FOR  SLEEP 90 capsule 1  . VENTOLIN HFA 108 (90 Base) MCG/ACT inhaler TAKE 2 PUFFS EVERY 6 HOURS AS NEEDED FORSHORTNESS OF BREATH/WHEEZING 18 g 4  . vitamin B-12 (CYANOCOBALAMIN) 100 MCG tablet Take 100 mcg by mouth daily.    Alveda Reasons 20 MG TABS tablet TAKE ONE TABLET BY MOUTH DAILY WITH SUPPER 30 tablet 5   No current facility-administered medications for this visit.     Allergies:   Patient has no known allergies.    Social History:  The patient  reports that she quit smoking about 16 years ago. Her smoking use included cigarettes. She has a 40.00 pack-year smoking history. she has never used smokeless tobacco. She reports that she drinks alcohol. She reports that she does not use drugs.   Family History:  The patient's family history includes Diabetes in her mother; Diabetes type II in her mother; Heart attack in her father; Heart disease in her brother, father, and sister; Hypertension in her mother.    ROS:  Please see the history of present illness. All other systems are reviewed and negative.    PHYSICAL EXAM: VS:  BP 120/70   Pulse (!) 53   Ht 5\' 2"  (1.575 m)   Wt 224 lb (101.6 kg)   BMI 40.97 kg/m  , BMI Body mass index is 40.97 kg/m. GENERAL:  Well appearing, obese WF in NAD HEENT:  PERRL, EOMI, sclera are clear. Oropharynx is clear. NECK:  Mild jugular venous distention, carotid upstroke brisk and symmetric, no bruits, no thyromegaly or adenopathy LUNGS:  Clear to auscultation bilaterally CHEST:  Unremarkable HEART:  IRRR,  PMI not displaced or sustained,S1 and S2 within normal limits, no S3, no S4: no clicks, no rubs, no murmurs ABD:  Soft, nontender. BS +, no masses or bruits. No hepatomegaly, no splenomegaly EXT:  2 + pulses throughout, 1+ edema, no cyanosis no clubbing SKIN:  Warm and dry.  No rashes NEURO:  Alert and oriented x 3. Cranial nerves II through XII  intact. PSYCH:  Cognitively intact     EKG:  EKG is ordered today. Sinus brady rate 53. First degree AV block. Otherwise normal. I have personally reviewed and interpreted this study.   ECHO: 09/17/2016 - Left ventricle: The cavity size was normal. Wall thickness was increased in a pattern of moderate LVH. Systolic function was moderately reduced. The estimated ejection fraction was in the range  of 35% to 40%. Diffuse hypokinesis. The study is not technically sufficient to allow evaluation of LV diastolicfunction. - Aortic valve: Trileaflet. Sclerosis without stenosis. There was no regurgitation. - Mitral valve: Calcified annulus. Mildly thickened leaflets . - Left atrium: The atrium was mildly dilated. - Right ventricle: The cavity size was normal. Mildly decreased RV systolic function. - Right atrium: Moderately dilated. - Tricuspid valve: There was moderate regurgitation. - Pulmonary arteries: PA peak pressure: 49 mm Hg (S). - Inferior vena cava: The vessel was dilated. The respirophasic diameter changes were blunted (<50%), consistent with elevated central venous pressure. Impressions: - Compared to a prior study in 2016, the LVEF is reduced now to 35-40%. A-fib wtih RVR is noted. The ascending aorta meausres 4.0 cm. There is mild AI, mild LAE, moderate RAE, moderate TR and RVSP of 49 mmHg with a dilated IVC.  Recent Labs: 09/15/2016: ALT 22; TSH 2.813 09/26/2016: Hemoglobin 16.0; Platelets 128 09/28/2016: Magnesium 2.0 02/20/2017: BNP 169.5; BUN 10; Creatinine, Ser 0.78; Potassium 4.5; Sodium 139  09/26/16; Dig level 0.6  Lipid Panel No results found for: CHOL, TRIG, HDL, CHOLHDL, VLDL, LDLCALC, LDLDIRECT   Wt Readings from Last 3 Encounters:  08/31/17 224 lb (101.6 kg)  05/26/17 223 lb (101.2 kg)  05/04/17 227 lb (103 kg)     Other studies Reviewed: Labs 12/10/15: cholesterol 136, triglycerides 82, HDL 38, LDL 82.  Dated 12/25/16: cholesterol  182, triglycerides 156, HDL 27, LDL 124. Chemistries and TSH normal.   ASSESSMENT AND PLAN:  1.  Chronic diastolic CHF: Her weight and swelling are stable. Diuretic dose has reduced.  Reinforced low sodium diet and fluid restriction. Recommend she weigh daily. If weight increases 2-3 lbs in a day or 5 lbs in a week then she is to take extra lasix or metolazone until weight returns to baseline.   2. COPD: on home Oxygen. Stable.  3.  Persistent atrial fibrillation:rate is well controlled. She is anticoagulated with Xarelto.  4. Hypercholesterolemia. Mild. Intolerant of statin.   Current medicines are reviewed at length with the patient today.  The patient does not have concerns regarding medicines.   Labs/ tests ordered today include:  No orders of the defined types were placed in this encounter.    Disposition:   FU 6 months.  Signed, Exavier Lina Martinique, MD  08/31/2017 4:19 PM    [place]

## 2017-08-31 ENCOUNTER — Ambulatory Visit (INDEPENDENT_AMBULATORY_CARE_PROVIDER_SITE_OTHER): Payer: Medicare Other | Admitting: Cardiology

## 2017-08-31 ENCOUNTER — Encounter: Payer: Self-pay | Admitting: Cardiology

## 2017-08-31 VITALS — BP 120/70 | HR 53 | Ht 62.0 in | Wt 224.0 lb

## 2017-08-31 DIAGNOSIS — I10 Essential (primary) hypertension: Secondary | ICD-10-CM | POA: Diagnosis not present

## 2017-08-31 DIAGNOSIS — I5032 Chronic diastolic (congestive) heart failure: Secondary | ICD-10-CM

## 2017-08-31 DIAGNOSIS — I4819 Other persistent atrial fibrillation: Secondary | ICD-10-CM

## 2017-08-31 DIAGNOSIS — I481 Persistent atrial fibrillation: Secondary | ICD-10-CM

## 2017-08-31 DIAGNOSIS — J9611 Chronic respiratory failure with hypoxia: Secondary | ICD-10-CM

## 2017-08-31 NOTE — Patient Instructions (Signed)
Continue your current therapy  I will see you in 6 months.   

## 2017-09-01 ENCOUNTER — Telehealth: Payer: Self-pay | Admitting: Internal Medicine

## 2017-09-01 MED ORDER — TIOTROPIUM BROMIDE MONOHYDRATE 18 MCG IN CAPS: ORAL_CAPSULE | RESPIRATORY_TRACT | 1 refills | Status: DC

## 2017-09-01 NOTE — Telephone Encounter (Signed)
Spoke with pt. She is needing a refill on Spiriva Handihaler. Rx has been sent to [facility] per her request. Nothing further was needed.

## 2017-09-28 ENCOUNTER — Other Ambulatory Visit: Payer: Self-pay | Admitting: Cardiology

## 2017-09-28 MED ORDER — POTASSIUM CHLORIDE ER 10 MEQ PO TBCR: 20.0000 meq | EXTENDED_RELEASE_TABLET | Freq: Two times a day (BID) | ORAL | 2 refills | Status: DC

## 2017-09-28 NOTE — Telephone Encounter (Signed)
Rx(s) sent to pharmacy electronically.  

## 2017-09-28 NOTE — Telephone Encounter (Signed)
New message      *STAT* If patient is at the pharmacy, call can be transferred to refill team.   1. Which medications need to be refilled? (please list name of each medication and dose if known)   potassium chloride (K-DUR) 10 MEQ tablet Take 2 tablets (20 mEq total) by mouth 2 (two) times daily. TAKE AN EXTRA 2 TABLETS (20 MEQ) ON DAY YOU TAKE METOLAZONE        2. Which pharmacy/location (including street and city if local pharmacy) is medication to be sent to? [facility] mail order   3. Do they need a 30 day or 90 day supply?  [place]

## 2017-11-02 ENCOUNTER — Encounter: Payer: Self-pay | Admitting: Internal Medicine

## 2017-11-02 ENCOUNTER — Ambulatory Visit (INDEPENDENT_AMBULATORY_CARE_PROVIDER_SITE_OTHER): Payer: Medicare Other | Admitting: Internal Medicine

## 2017-11-02 VITALS — BP 128/74 | HR 57 | Ht 62.0 in | Wt 228.4 lb

## 2017-11-02 DIAGNOSIS — J449 Chronic obstructive pulmonary disease, unspecified: Secondary | ICD-10-CM | POA: Diagnosis not present

## 2017-11-02 DIAGNOSIS — I482 Chronic atrial fibrillation, unspecified: Secondary | ICD-10-CM

## 2017-11-02 DIAGNOSIS — J9611 Chronic respiratory failure with hypoxia: Secondary | ICD-10-CM

## 2017-11-02 MED ORDER — BUDESONIDE-FORMOTEROL FUMARATE 160-4.5 MCG/ACT IN AERO: 2.0000 | INHALATION_SPRAY | Freq: Every day | RESPIRATORY_TRACT | 0 refills | Status: DC

## 2017-11-02 NOTE — Assessment & Plan Note (Signed)
She remains oxygen dependent and this is not likely to change.  She wants to be able to adjust the oxygen level on her home concentrator and at times may need to go up to 3 L.  There would be some potential for CO2 retention at 4 or higher liters per minute.

## 2017-11-02 NOTE — Assessment & Plan Note (Signed)
She is in either sinus rhythm today or atrial fibrillation with extremely good ventricular response rate control.

## 2017-11-02 NOTE — Assessment & Plan Note (Signed)
Not sure there is any significant reversibility.  We are going to let her try Trelegy with discussion.
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
## 2017-11-02 NOTE — Patient Instructions (Signed)
Sample Trelegy inhaler    Inhale 1 puff, once daily, then rinse mouth.                        Try this for a while instead of your Spiriva to see if it works better.

## 2017-11-27 ENCOUNTER — Telehealth: Payer: Self-pay | Admitting: Internal Medicine

## 2017-11-27 ENCOUNTER — Telehealth: Payer: Self-pay | Admitting: Cardiology

## 2017-11-27 MED ORDER — ALBUTEROL SULFATE HFA 108 (90 BASE) MCG/ACT IN AERS: INHALATION_SPRAY | RESPIRATORY_TRACT | 4 refills | Status: DC

## 2017-11-27 NOTE — Telephone Encounter (Signed)
Samples provided. Patient notified to come pick up front at office. Patient had no questions or concerns.   Xarelo 20mg  x3 bottles  Lot- [id number] Exp- 04/21

## 2017-11-27 NOTE — Telephone Encounter (Signed)
New message    Patient calling the office for samples of medication:   1.  What medication and dosage are you requesting samples for? XARELTO 20 MG TABS tablet  2.  Are you currently out of this medication? Yes

## 2017-11-27 NOTE — Telephone Encounter (Signed)
Called and spoke with patient, she states that she is needing a refill of her Ventolin refilled. [facility] has closed down so she is wanting it sent to [facility] on [address] in [place]. Patient states all of her other medications have been sent through [facility]. Refill sent nothing further needed.

## 2017-12-30 DIAGNOSIS — E041 Nontoxic single thyroid nodule: Secondary | ICD-10-CM | POA: Diagnosis not present

## 2017-12-30 DIAGNOSIS — D045 Carcinoma in situ of skin of trunk: Secondary | ICD-10-CM | POA: Diagnosis not present

## 2017-12-30 DIAGNOSIS — D0461 Carcinoma in situ of skin of right upper limb, including shoulder: Secondary | ICD-10-CM | POA: Diagnosis not present

## 2017-12-30 DIAGNOSIS — L821 Other seborrheic keratosis: Secondary | ICD-10-CM | POA: Diagnosis not present

## 2017-12-30 DIAGNOSIS — C44519 Basal cell carcinoma of skin of other part of trunk: Secondary | ICD-10-CM | POA: Diagnosis not present

## 2017-12-30 DIAGNOSIS — E7849 Other hyperlipidemia: Secondary | ICD-10-CM | POA: Diagnosis not present

## 2017-12-30 DIAGNOSIS — C44612 Basal cell carcinoma of skin of right upper limb, including shoulder: Secondary | ICD-10-CM | POA: Diagnosis not present

## 2017-12-30 DIAGNOSIS — I10 Essential (primary) hypertension: Secondary | ICD-10-CM | POA: Diagnosis not present

## 2017-12-30 DIAGNOSIS — R82998 Other abnormal findings in urine: Secondary | ICD-10-CM | POA: Diagnosis not present

## 2017-12-30 DIAGNOSIS — E785 Hyperlipidemia, unspecified: Secondary | ICD-10-CM | POA: Diagnosis not present

## 2017-12-30 DIAGNOSIS — D485 Neoplasm of uncertain behavior of skin: Secondary | ICD-10-CM | POA: Diagnosis not present

## 2018-01-06 DIAGNOSIS — Z23 Encounter for immunization: Secondary | ICD-10-CM | POA: Diagnosis not present

## 2018-03-01 DIAGNOSIS — I10 Essential (primary) hypertension: Secondary | ICD-10-CM | POA: Diagnosis not present

## 2018-03-01 DIAGNOSIS — M7030 Other bursitis of elbow, unspecified elbow: Secondary | ICD-10-CM | POA: Diagnosis not present

## 2018-03-01 DIAGNOSIS — Z6841 Body Mass Index (BMI) 40.0 and over, adult: Secondary | ICD-10-CM | POA: Diagnosis not present

## 2018-03-01 DIAGNOSIS — M7021 Olecranon bursitis, right elbow: Secondary | ICD-10-CM | POA: Diagnosis not present

## 2018-03-04 DIAGNOSIS — M7021 Olecranon bursitis, right elbow: Secondary | ICD-10-CM | POA: Diagnosis not present

## 2018-03-05 ENCOUNTER — Other Ambulatory Visit: Payer: Self-pay | Admitting: Internal Medicine

## 2018-03-05 DIAGNOSIS — Z1231 Encounter for screening mammogram for malignant neoplasm of breast: Secondary | ICD-10-CM

## 2018-03-09 DIAGNOSIS — T45515A Adverse effect of anticoagulants, initial encounter: Secondary | ICD-10-CM | POA: Diagnosis not present

## 2018-03-09 DIAGNOSIS — R040 Epistaxis: Secondary | ICD-10-CM | POA: Diagnosis not present

## 2018-03-12 DIAGNOSIS — M7021 Olecranon bursitis, right elbow: Secondary | ICD-10-CM | POA: Diagnosis not present

## 2018-03-23 ENCOUNTER — Other Ambulatory Visit: Payer: Self-pay | Admitting: Cardiology

## 2018-03-29 DIAGNOSIS — M7021 Olecranon bursitis, right elbow: Secondary | ICD-10-CM | POA: Diagnosis not present

## 2018-04-05 ENCOUNTER — Ambulatory Visit
Admission: RE | Admit: 2018-04-05 | Discharge: 2018-04-05 | Disposition: A | Discharge status: Home / Self Care | Payer: Medicare Other | Source: Ambulatory Visit | Attending: Internal Medicine | Admitting: Internal Medicine

## 2018-04-05 DIAGNOSIS — Z1231 Encounter for screening mammogram for malignant neoplasm of breast: Secondary | ICD-10-CM | POA: Diagnosis not present

## 2018-04-09 NOTE — Progress Notes (Signed)
Cardiology Office Note   Date:  04/12/2018   ID:  [name], DOB [birth year], MRN [id number]  PCP:  Burnard Bunting, MD  Cardiologist:  Jacelynn Hayton Martinique, MD   Chief Complaint  Patient presents with  . Atrial Fibrillation  . Congestive Heart Failure    History of Present Illness: [name] is a 77 y.o. female with a history of persistent AF on Xarelto, D-CHF, COPD on home O2, Anxiety, DVT, HTN, HLD, hypothyroidism, OA, NSC lung CA, OSA, s/p Aobi-iliac stent graft, 4.5 cm thoracic aneurysm (Dr Early), CHAS2DS2VASC=7 (female, age x 2, DVT x 2, CHF, HTN)  She was admitted 03/26-04/01/2017 for rapid afib and CHF, not a candidate for amio w/ lung dz, Tikosyn and Sotalol contraindicated with prolonged QT. Multaq and flecainide are not good choices with CHF. Was managed with rate control and diuresis.  Seen in December 2018  by Dr. Donnetta Hutching. CT done showing stable saccular aneurysm size. She is followed by Dr. Annamaria Boots for her chronic lung disease.   On follow up today she reports her breathing is stable. She gets SOB easy with activity. She is on home oxygen. Weight is stable. Denies any increase in edema. She has used metolazone only once since her last visit with good response. Does not weigh regularly.   She is having a bad time with arthritis in her left shoulder. Wants to know if she is a surgical candidate.   Past Medical History:  Diagnosis Date  . AAA (abdominal aortic aneurysm) ([place])   . Anticoagulant long-term use    Failed on Coumadin. On Xarelto  . Anxiety   . Anxiety and depression   . Atrial fib/flutter, transient June 2012  . Chronic lower back pain   . COPD (chronic obstructive pulmonary disease) ([place])   . DVT (deep venous thrombosis) ([place]) 2004   BLE  . Esophageal dysmotility   . Exertional dyspnea   . Hiatal hernia   . History of bronchitis   . History of fibrocystic disease of breast   . History of uterine fibroid   . HTN (hypertension)   . Hyperlipidemia   .  Hypothyroidism   . Normal nuclear stress test 2012   May 2012  . OA (osteoarthritis)    "knees; left shoulder"  . OSA (obstructive sleep apnea)    "haven't been using my CPAP lately" (01/21/12)  . Ovarian mass    right benign  . Ovarian mass    benign, right  . PAD (peripheral artery disease) ([place])   . Small cell carcinoma of lung ([place]) 2004   NON-SMALL CELL CARCINOMA OF THE LUNG, METASTATIC TO THE SUPRACLAVICULAR AND MEDIASTINAL LYMPH NODES; in remission    Past Surgical History:  Procedure Laterality Date  . ABDOMINAL AORTIC ANEURYSM REPAIR  ~ 2010   stent graft  . BLADDER SURGERY  ~ 2003   sling  . BREAST BIOPSY  1960's   both breast's - benign  . CARDIOVASCULAR STRESS TEST  2012   No ischemia  . CATARACT EXTRACTION W/ INTRAOCULAR LENS  IMPLANT, BILATERAL Bilateral ~ 2009  . REPLACEMENT TOTAL KNEE Left ~ 2008   left  . THYROIDECTOMY  ~ 1964    Current Outpatient Medications  Medication Sig Dispense Refill  . albuterol (VENTOLIN HFA) 108 (90 Base) MCG/ACT inhaler TAKE 2 PUFFS EVERY 6 HOURS AS NEEDED FORSHORTNESS OF BREATH/WHEEZING 18 g 4  . budesonide-formoterol (SYMBICORT) 160-4.5 MCG/ACT inhaler Inhale 2 puffs into the lungs daily. 2 Inhaler 0  .  CALCIUM-MAGNESIUM-ZINC PO Take 1 tablet by mouth daily.     . Cholecalciferol (VITAMIN D) 1000 UNITS capsule Take 1,000 Units by mouth daily.      . citalopram (CELEXA) 20 MG tablet Take 20 mg by mouth daily.     . digoxin (LANOXIN) 0.125 MG tablet TAKE 1 TABLET EVERY DAY 90 tablet 3  . furosemide (LASIX) 40 MG tablet TAKE 2 TABLETS TWICE DAILY 360 tablet 2  . levothyroxine (SYNTHROID, LEVOTHROID) 88 MCG tablet Take 88 mcg by mouth daily.    . metolazone (ZAROXOLYN) 2.5 MG tablet 1 DAILY FOR 3 DAYS AND THEN ONE AS NEEDED FOR WEIGHT GAIN 30 tablet 2  . metoprolol tartrate (LOPRESSOR) 50 MG tablet TAKE 1 TABLET THREE TIMES DAILY 270 tablet 3  . morphine (MSIR) 15 MG tablet Take 1 tablet by mouth as needed.    . NON FORMULARY  daily. 2 liters oxygen    . OXYGEN Inhale 2 L into the lungs daily.    . potassium chloride (K-DUR) 10 MEQ tablet Take 2 tablets (20 mEq total) by mouth 2 (two) times daily. TAKE AN EXTRA 2 TABLETS (20 MEQ) ON DAY YOU TAKE METOLAZONE 270 tablet 2  . temazepam (RESTORIL) 15 MG capsule TAKE 1 CAPSULE AT BEDTIME AS NEEDED  FOR  SLEEP 90 capsule 1  . tiotropium (SPIRIVA HANDIHALER) 18 MCG inhalation capsule PLACE 1 CAPSULE INTO INHALER AND INHALE ONCE DAILY AS DIRECTED 90 capsule 1  . vitamin B-12 (CYANOCOBALAMIN) 100 MCG tablet Take 100 mcg by mouth daily.    Alveda Reasons 20 MG TABS tablet TAKE ONE TABLET BY MOUTH DAILY WITH SUPPER 30 tablet 5   No current facility-administered medications for this visit.     Allergies:   Patient has no known allergies.    Social History:  The patient  reports that she quit smoking about 16 years ago. Her smoking use included cigarettes. She has a 40.00 pack-year smoking history. She has never used smokeless tobacco. She reports that she drinks alcohol. She reports that she does not use drugs.   Family History:  The patient's family history includes Diabetes in her mother; Diabetes type II in her mother; Heart attack in her father; Heart disease in her brother, father, and sister; Hypertension in her mother.    ROS:  Please see the history of present illness. All other systems are reviewed and negative.    PHYSICAL EXAM: VS:  BP 124/82   Pulse (!) 54   Ht 5\' 2"  (1.575 m)   Wt 224 lb 6.4 oz (101.8 kg)   BMI 41.04 kg/m  , BMI Body mass index is 41.04 kg/m. GENERAL:  Well appearing, obese WF in NAD HEENT:  PERRL, EOMI, sclera are clear. Oropharynx is clear. NECK:  Mild jugular venous distention, carotid upstroke brisk and symmetric, no bruits, no thyromegaly or adenopathy LUNGS:  Bilateral crackles at the bases. CHEST:  Unremarkable HEART:  IRRR,  PMI not displaced or sustained,S1 and S2 within normal limits, no S3, no S4: no clicks, no rubs, no  murmurs ABD:  Soft, nontender. BS +, no masses or bruits. No hepatomegaly, no splenomegaly EXT:  2 + pulses throughout, 1+ edema, no cyanosis no clubbing SKIN:  Warm and dry.  No rashes NEURO:  Alert and oriented x 3. Cranial nerves II through XII intact. PSYCH:  Cognitively intact     EKG:  EKG is not ordered today.   ECHO: 09/17/2016 - Left ventricle: The cavity size was normal. Wall thickness  was increased in a pattern of moderate LVH. Systolic function was moderately reduced. The estimated ejection fraction was in the range of 35% to 40%. Diffuse hypokinesis. The study is not technically sufficient to allow evaluation of LV diastolicfunction. - Aortic valve: Trileaflet. Sclerosis without stenosis. There was no regurgitation. - Mitral valve: Calcified annulus. Mildly thickened leaflets . - Left atrium: The atrium was mildly dilated. - Right ventricle: The cavity size was normal. Mildly decreased RV systolic function. - Right atrium: Moderately dilated. - Tricuspid valve: There was moderate regurgitation. - Pulmonary arteries: PA peak pressure: 49 mm Hg (S). - Inferior vena cava: The vessel was dilated. The respirophasic diameter changes were blunted (<50%), consistent with elevated central venous pressure. Impressions: - Compared to a prior study in 2016, the LVEF is reduced now to 35-40%. A-fib wtih RVR is noted. The ascending aorta meausres 4.0 cm. There is mild AI, mild LAE, moderate RAE, moderate TR and RVSP of 49 mmHg with a dilated IVC.  Recent Labs: No results found for requested labs within last 8760 hours.  09/26/16; Dig level 0.6  Lipid Panel No results found for: CHOL, TRIG, HDL, CHOLHDL, VLDL, LDLCALC, LDLDIRECT   Wt Readings from Last 3 Encounters:  04/12/18 224 lb 6.4 oz (101.8 kg)  11/02/17 228 lb 6.4 oz (103.6 kg)  08/31/17 224 lb (101.6 kg)     Other studies Reviewed: Labs 12/10/15: cholesterol 136, triglycerides 82, HDL 38,  LDL 82.  Dated 12/25/16: cholesterol 182, triglycerides 156, HDL 27, LDL 124. Chemistries and TSH normal. Dated 12/31/17: cholesterol 173, triglycerides 187, HDL 32, LDL 104. Chemistries and TSH normal. Dated 03/11/18: CBC normal.   ASSESSMENT AND PLAN:  1.  Chronic combined systolic/diastolic CHF: Her weight and swelling are stable.   Reinforced low sodium diet and fluid restriction. Recommend she weigh daily and if weight increases 2-3 lbs in a day or 5 lbs in a week then she is to take extra lasix or metolazone until weight returns to baseline.   2.  COPD: Chronic respiratory failure on home Oxygen. Stable.  3.  Persistent atrial fibrillation:rate is well controlled. She is anticoagulated with Xarelto.  4. Hypercholesterolemia. Mild. Intolerant of statin.  I think she would be high risk for general anesthesia due to her combined cardiac and pulmonary condition. I would  recommend against having surgery for her shoulder.      Disposition:   FU 6 months.  Signed, Laurali Goddard Martinique, MD  04/12/2018 2:46 PM    [place]

## 2018-04-12 ENCOUNTER — Ambulatory Visit (INDEPENDENT_AMBULATORY_CARE_PROVIDER_SITE_OTHER): Payer: Medicare Other | Admitting: Cardiology

## 2018-04-12 ENCOUNTER — Encounter: Payer: Self-pay | Admitting: Cardiology

## 2018-04-12 VITALS — BP 124/82 | HR 54 | Ht 62.0 in | Wt 224.4 lb

## 2018-04-12 DIAGNOSIS — J9611 Chronic respiratory failure with hypoxia: Secondary | ICD-10-CM | POA: Diagnosis not present

## 2018-04-12 DIAGNOSIS — I480 Paroxysmal atrial fibrillation: Secondary | ICD-10-CM

## 2018-04-12 DIAGNOSIS — Z23 Encounter for immunization: Secondary | ICD-10-CM | POA: Diagnosis not present

## 2018-04-12 DIAGNOSIS — I10 Essential (primary) hypertension: Secondary | ICD-10-CM | POA: Diagnosis not present

## 2018-04-12 DIAGNOSIS — I714 Abdominal aortic aneurysm, without rupture, unspecified: Secondary | ICD-10-CM

## 2018-04-12 DIAGNOSIS — I5042 Chronic combined systolic (congestive) and diastolic (congestive) heart failure: Secondary | ICD-10-CM | POA: Diagnosis not present

## 2018-04-12 NOTE — Patient Instructions (Signed)
Continue your current therapy  Weigh yourself daily. If you notice a weight gain of 2-3 lbs in one day or 5 lbs in one week then take metolazone.

## 2018-04-15 DIAGNOSIS — M7021 Olecranon bursitis, right elbow: Secondary | ICD-10-CM | POA: Diagnosis not present

## 2018-04-22 DIAGNOSIS — I8312 Varicose veins of left lower extremity with inflammation: Secondary | ICD-10-CM | POA: Diagnosis not present

## 2018-04-22 DIAGNOSIS — C44622 Squamous cell carcinoma of skin of right upper limb, including shoulder: Secondary | ICD-10-CM | POA: Diagnosis not present

## 2018-04-22 DIAGNOSIS — L218 Other seborrheic dermatitis: Secondary | ICD-10-CM | POA: Diagnosis not present

## 2018-04-22 DIAGNOSIS — I872 Venous insufficiency (chronic) (peripheral): Secondary | ICD-10-CM | POA: Diagnosis not present

## 2018-04-22 DIAGNOSIS — I8311 Varicose veins of right lower extremity with inflammation: Secondary | ICD-10-CM | POA: Diagnosis not present

## 2018-04-22 DIAGNOSIS — Z85828 Personal history of other malignant neoplasm of skin: Secondary | ICD-10-CM | POA: Diagnosis not present

## 2018-04-22 DIAGNOSIS — L820 Inflamed seborrheic keratosis: Secondary | ICD-10-CM | POA: Diagnosis not present

## 2018-04-22 DIAGNOSIS — C44612 Basal cell carcinoma of skin of right upper limb, including shoulder: Secondary | ICD-10-CM | POA: Diagnosis not present

## 2018-04-22 DIAGNOSIS — D1801 Hemangioma of skin and subcutaneous tissue: Secondary | ICD-10-CM | POA: Diagnosis not present

## 2018-04-22 DIAGNOSIS — D485 Neoplasm of uncertain behavior of skin: Secondary | ICD-10-CM | POA: Diagnosis not present

## 2018-04-22 DIAGNOSIS — L821 Other seborrheic keratosis: Secondary | ICD-10-CM | POA: Diagnosis not present

## 2018-04-22 DIAGNOSIS — L918 Other hypertrophic disorders of the skin: Secondary | ICD-10-CM | POA: Diagnosis not present

## 2018-04-29 DIAGNOSIS — M25512 Pain in left shoulder: Secondary | ICD-10-CM | POA: Diagnosis not present

## 2018-05-10 ENCOUNTER — Encounter: Payer: Self-pay | Admitting: Internal Medicine

## 2018-05-10 ENCOUNTER — Ambulatory Visit (INDEPENDENT_AMBULATORY_CARE_PROVIDER_SITE_OTHER): Payer: Medicare Other | Admitting: Internal Medicine

## 2018-05-10 DIAGNOSIS — J9611 Chronic respiratory failure with hypoxia: Secondary | ICD-10-CM

## 2018-05-10 DIAGNOSIS — J449 Chronic obstructive pulmonary disease, unspecified: Secondary | ICD-10-CM

## 2018-05-10 DIAGNOSIS — I482 Chronic atrial fibrillation, unspecified: Secondary | ICD-10-CM | POA: Diagnosis not present

## 2018-05-10 MED ORDER — FLUTICASONE-UMECLIDIN-VILANT 100-62.5-25 MCG/INH IN AEPB: 1.0000 | INHALATION_SPRAY | Freq: Every day | RESPIRATORY_TRACT | 0 refills | Status: DC

## 2018-05-10 NOTE — Patient Instructions (Signed)
Sample Trelegy    Inhale 1 puff then rinse mouth, once daily  When your Ellin Mayhew comes in, go back to use it up. Before you order Spiriva again, call us about changing to Trelegy or something else cheaper.   Ok to continue using your oxygen 2-3L   Please call if we can help

## 2018-05-10 NOTE — Progress Notes (Signed)
HPI female former smoker followed for COPD, chronic respiratory failure with hypoxia, OSA/failed CPAP, history lung CA/XRT/chemotherapy, DVT, complicated by AFib, chronic diastolic CHF  7829- Had CT scan of chest 05/06/2011 to followup of her aortic aneurysm. Showed emphysema, stable aneurysm, enlarged right lobe of thyroid, coronary disease. 2013-Cardiology has told her "heart is fine'. Dr Jana Hakim has told her no recurrence of lung Ca Office spirometry 11/28/2014-moderate restriction of exhaled volume and moderate obstruction. FVC 1.75/69%, FEV1 1.26/66%, FEV1/FVC 0.72, FEF 25-75 percent 0.77/47%. Echo 09/17/16-EF 35%-40%, PA pressure 49, IVC dilated consistent with elevated CVP, A. fib PFT 09/17/16-severe obstructive airways disease without response to dilator, severe restriction, severe reduction of diffusion. FVC 1.10/44%, FEV1 0.80/43%, ratio 0.72, TLC 67%, DLCO 25% CT chest HR 09/04/15-. There are findings in the lungs which could indicate interstitial lung disease, such is mild nonspecific interstitial pneumonia  (NSIP).  -----------------------------------------------------------------------------------------------  11/02/2017- 77 year old female former smoker followed for COPD, chronic respiratory failure with hypoxia, ? ILD versus edema, OSA/failed CPAP, complicated by history lung CA/XRT/chemotherapy, DVT, AFib/ Xarelto, chronic diastolic CHF O2 3 L/Advanced ----Chronic Resp Failure with hypoxia-DME: [facility]. Pt uses O2. Ventolin hfa, spiriva handihaler,  She never got the humidifier attachment for home concentrator.  We had planned at last visit.  She also needs assistance learning how to adjust the oxygen liter flow on the concentrator. "I need to work on getting more fluid off".  She feels bloated, especially ankles.  Back pain limits activity but [facility]  05/10/2018- 77 year old female former smoker followed for COPD, chronic respiratory failure with hypoxia,  ? ILD versus edema, OSA/failed CPAP, complicated by history lung CA/XRT/chemotherapy, DVT, AFib/ Xarelto, chronic diastolic CHF O2 3 L/Advanced -----Emphysema: Pt continues to use O2 through [facility]. Pt states she would like to discuss Trelegy inhaler vs her other inhalers.  Spiriva, albuterol HFA, Pending arrival of re-order Spiriva. She had thought Trelegy sample seemed ok, but doesn't remember it well.   ROS-see HPI + = positive Constitutional:   No-   weight loss, night sweats, fevers, chills, fatigue, lassitude. HEENT:   No-  headaches, difficulty swallowing, tooth/dental problems, sore throat,       No-  sneezing, itching, ear ache, nasal congestion, post nasal drip,  CV:  No-   chest pain, orthopnea, PND,  swelling in lower extremities, No-anasarca, dizziness,                     +Occasional palpitations Resp:    +shortness of breath with exertion or at rest.              No- productive cough,  +non-productive cough,  no-coughing up of blood.              No-   change in color of mucus.  No- wheezing.   Skin: No-   rash or lesions. GI:  No-   heartburn, indigestion, abdominal pain, nausea, vomiting,  GU: N MS:  No-   joint pain or swelling.  + Back pain Neuro-     nothing unusual Psych:  No- change in mood or affect. No depression or anxiety.  No memory loss.  OBJ- Physical Exam   General- Alert, Oriented, Affect-appropriate, Distress- none, + obese. O2 3L/ POC Skin- rash-none, lesions- none, excoriation- none Lymphadenopathy- none Head- atraumatic            Eyes- Gross vision intact, PERRLA, conjunctivae and secretions clear  Ears- Hearing, canals-normal            Nose- Clear, no-Septal dev, mucus, polyps, erosion, perforation             Throat- Mallampati III , mucosa clear , drainage- none, tonsils- atrophic, + dentures Neck- flexible , trachea midline, no stridor , thyroid nl, carotid no bruit Chest - symmetrical excursion , unlabored           Heart/CV- + RRR  no  murmur heard , no gallop  , no rub, nl s1 s2                           - JVD- none ,  edema +2-3 chronic, + stasis changes, varices- none           Lung- clear, wheeze- none, cough- none , dullness-none, rub- none           Chest wall-  Abd-  Br/ Gen/ Rectal- Not done, not indicated Extrem- cyanosis- none, clubbing, none, atrophy- none, strength- nl,+ Stasis changes with heavy legs Neuro- grossly intact to observation

## 2018-05-11 NOTE — Assessment & Plan Note (Signed)
She is going to retry trelegy sample for clarification.

## 2018-05-11 NOTE — Assessment & Plan Note (Signed)
She benefits from oxygen and depends on it.

## 2018-05-11 NOTE — Assessment & Plan Note (Signed)
Rhythm is well controlled at this visit. Followed by cardiology.

## 2018-05-27 ENCOUNTER — Telehealth: Payer: Self-pay | Admitting: Cardiology

## 2018-05-27 MED ORDER — RIVAROXABAN 20 MG PO TABS: ORAL_TABLET | ORAL | 3 refills | Status: DC

## 2018-05-27 MED ORDER — RIVAROXABAN 20 MG PO TABS: ORAL_TABLET | ORAL | 1 refills | Status: DC

## 2018-05-27 NOTE — Telephone Encounter (Signed)
Spoke with pt. Pt sts that she called becuase needs her Xarelto refilled and sent to her mail order pharmacy [facility]. Adv her that we do not currently have and samples of Xarelto 20mg . Pt sts that she has contacted her pcp and they will be able to provide her with 1 bottle of samples. Pt sts that this should cover her until she receives her refills in the mail. Pt Xarelto 20mg  daily refilled for #90 R-3 Pt voiced appreciation for the assistance. Nothing further needed at this time.

## 2018-05-27 NOTE — Telephone Encounter (Signed)
New Message    *STAT* If patient is at the pharmacy, call can be transferred to refill team.   1. Which medications need to be refilled? (please list name of each medication and dose if known) XARELTO 20 MG TABS tablet  2. Which pharmacy/location (including street and city if local pharmacy) is medication to be sent to? [place], [place]  3. Do they need a 30 day or 90 day supply? [place]

## 2018-05-27 NOTE — Telephone Encounter (Signed)
New Message   Patient calling the office for samples of medication:   1.  What medication and dosage are you requesting samples for? XARELTO 20 MG TABS tablet   2.  Are you currently out of this medication? 3 days remaining   Patient is needing some samples until her rx comes in

## 2018-05-31 ENCOUNTER — Other Ambulatory Visit: Payer: Self-pay

## 2018-05-31 DIAGNOSIS — I714 Abdominal aortic aneurysm, without rupture, unspecified: Secondary | ICD-10-CM

## 2018-05-31 DIAGNOSIS — I712 Thoracic aortic aneurysm, without rupture, unspecified: Secondary | ICD-10-CM

## 2018-07-07 DIAGNOSIS — E038 Other specified hypothyroidism: Secondary | ICD-10-CM | POA: Diagnosis not present

## 2018-07-07 DIAGNOSIS — C3490 Malignant neoplasm of unspecified part of unspecified bronchus or lung: Secondary | ICD-10-CM | POA: Diagnosis not present

## 2018-07-07 DIAGNOSIS — I10 Essential (primary) hypertension: Secondary | ICD-10-CM | POA: Diagnosis not present

## 2018-07-07 DIAGNOSIS — Z85828 Personal history of other malignant neoplasm of skin: Secondary | ICD-10-CM | POA: Diagnosis not present

## 2018-07-07 DIAGNOSIS — I480 Paroxysmal atrial fibrillation: Secondary | ICD-10-CM | POA: Diagnosis not present

## 2018-07-07 DIAGNOSIS — D485 Neoplasm of uncertain behavior of skin: Secondary | ICD-10-CM | POA: Diagnosis not present

## 2018-07-07 DIAGNOSIS — L918 Other hypertrophic disorders of the skin: Secondary | ICD-10-CM | POA: Diagnosis not present

## 2018-07-07 DIAGNOSIS — L570 Actinic keratosis: Secondary | ICD-10-CM | POA: Diagnosis not present

## 2018-07-07 DIAGNOSIS — D508 Other iron deficiency anemias: Secondary | ICD-10-CM | POA: Diagnosis not present

## 2018-07-07 DIAGNOSIS — I509 Heart failure, unspecified: Secondary | ICD-10-CM | POA: Diagnosis not present

## 2018-07-07 DIAGNOSIS — Z9981 Dependence on supplemental oxygen: Secondary | ICD-10-CM | POA: Diagnosis not present

## 2018-07-07 DIAGNOSIS — L821 Other seborrheic keratosis: Secondary | ICD-10-CM | POA: Diagnosis not present

## 2018-07-07 DIAGNOSIS — E7849 Other hyperlipidemia: Secondary | ICD-10-CM | POA: Diagnosis not present

## 2018-07-07 DIAGNOSIS — Z6841 Body Mass Index (BMI) 40.0 and over, adult: Secondary | ICD-10-CM | POA: Diagnosis not present

## 2018-07-07 DIAGNOSIS — L814 Other melanin hyperpigmentation: Secondary | ICD-10-CM | POA: Diagnosis not present

## 2018-07-07 DIAGNOSIS — E041 Nontoxic single thyroid nodule: Secondary | ICD-10-CM | POA: Diagnosis not present

## 2018-07-07 DIAGNOSIS — J449 Chronic obstructive pulmonary disease, unspecified: Secondary | ICD-10-CM | POA: Diagnosis not present

## 2018-07-07 DIAGNOSIS — I714 Abdominal aortic aneurysm, without rupture: Secondary | ICD-10-CM | POA: Diagnosis not present

## 2018-07-13 ENCOUNTER — Encounter: Payer: Self-pay | Admitting: Vascular Surgery

## 2018-07-13 ENCOUNTER — Other Ambulatory Visit: Payer: Medicare Other

## 2018-07-13 ENCOUNTER — Ambulatory Visit (INDEPENDENT_AMBULATORY_CARE_PROVIDER_SITE_OTHER): Payer: Medicare Other | Admitting: Vascular Surgery

## 2018-07-13 ENCOUNTER — Ambulatory Visit
Admission: RE | Admit: 2018-07-13 | Discharge: 2018-07-13 | Disposition: A | Discharge status: Home / Self Care | Payer: Medicare Other | Source: Ambulatory Visit | Attending: Vascular Surgery | Admitting: Vascular Surgery

## 2018-07-13 ENCOUNTER — Other Ambulatory Visit: Payer: Self-pay

## 2018-07-13 VITALS — BP 140/77 | HR 60 | Resp 24 | Ht 62.0 in | Wt 223.0 lb

## 2018-07-13 DIAGNOSIS — I712 Thoracic aortic aneurysm, without rupture, unspecified: Secondary | ICD-10-CM

## 2018-07-13 DIAGNOSIS — I714 Abdominal aortic aneurysm, without rupture, unspecified: Secondary | ICD-10-CM

## 2018-07-13 MED ORDER — IOPAMIDOL (ISOVUE-370) INJECTION 76%
75.0000 mL | Freq: Once | INTRAVENOUS | Status: AC | PRN
  Administered 2018-07-13: 75 mL via INTRAVENOUS

## 2018-07-13 NOTE — Progress Notes (Signed)
[facility] [place]  Patient name: [name] MRN: [id number] DOB: [birth year] Sex: female  REASON FOR VISIT: Follow up saccular thoracic aortic aneurysm and prior stent graft repair of infrarenal aortic aneurysm  HPI: [name] is a 78 y.o. female here today for follow-up.  She has no new medical difficulty since my last visit with her.  She does continue to have severe COPD which limits her.  He has no symptoms referable to her aneurysm.  Past Medical History:  Diagnosis Date  . AAA (abdominal aortic aneurysm) ([place])   . Anticoagulant long-term use    Failed on Coumadin. On Xarelto  . Anxiety   . Anxiety and depression   . Atrial fib/flutter, transient June 2012  . Chronic lower back pain   . COPD (chronic obstructive pulmonary disease) ([place])   . DVT (deep venous thrombosis) ([place]) 2004   BLE  . Esophageal dysmotility   . Exertional dyspnea   . Hiatal hernia   . History of bronchitis   . History of fibrocystic disease of breast   . History of uterine fibroid   . HTN (hypertension)   . Hyperlipidemia   . Hypothyroidism   . Normal nuclear stress test 2012   May 2012  . OA (osteoarthritis)    "knees; left shoulder"  . OSA (obstructive sleep apnea)    "haven't been using my CPAP lately" (01/21/12)  . Ovarian mass    right benign  . Ovarian mass    benign, right  . PAD (peripheral artery disease) ([place])   . Small cell carcinoma of lung ([place]) 2004   NON-SMALL CELL CARCINOMA OF THE LUNG, METASTATIC TO THE SUPRACLAVICULAR AND MEDIASTINAL LYMPH NODES; in remission    Family History  Problem Relation Age of Onset  . Heart disease Father   . Heart attack Father   . Diabetes type II Mother   . Diabetes Mother   . Hypertension Mother   . Heart disease Brother        before age 7  . Heart disease Sister        See's Dr. Acie Fredrickson  . Breast cancer Neg Hx     SOCIAL HISTORY: Social History   Tobacco Use  .  Smoking status: Former Smoker    Packs/day: 1.00    Years: 40.00    Pack years: 40.00    Types: Cigarettes    Last attempt to quit: 06/23/2001    Years since quitting: 17.0  . Smokeless tobacco: Never Used  Substance Use Topics  . Alcohol use: Yes    Alcohol/week: 0.0 standard drinks    Comment: once a week (glass of wine)    No Known Allergies  Current Outpatient Medications  Medication Sig Dispense Refill  . albuterol (VENTOLIN HFA) 108 (90 Base) MCG/ACT inhaler TAKE 2 PUFFS EVERY 6 HOURS AS NEEDED FORSHORTNESS OF BREATH/WHEEZING 18 g 4  . CALCIUM-MAGNESIUM-ZINC PO Take 1 tablet by mouth daily.     . Cholecalciferol (VITAMIN D) 1000 UNITS capsule Take 1,000 Units by mouth daily.      . citalopram (CELEXA) 20 MG tablet Take 20 mg by mouth daily.     . digoxin (LANOXIN) 0.125 MG tablet TAKE 1 TABLET EVERY DAY 90 tablet 3  . Fluticasone-Umeclidin-Vilant (TRELEGY ELLIPTA) 100-62.5-25 MCG/INH AEPB Inhale 1 puff into the lungs daily. 1 each 0  . furosemide (LASIX) 40 MG tablet TAKE 2 TABLETS TWICE DAILY 360 tablet 2  . levothyroxine (SYNTHROID, LEVOTHROID)  88 MCG tablet Take 88 mcg by mouth daily.    . metolazone (ZAROXOLYN) 2.5 MG tablet 1 DAILY FOR 3 DAYS AND THEN ONE AS NEEDED FOR WEIGHT GAIN 30 tablet 2  . metoprolol tartrate (LOPRESSOR) 50 MG tablet TAKE 1 TABLET THREE TIMES DAILY 270 tablet 3  . morphine (MSIR) 15 MG tablet Take 1 tablet by mouth as needed.    . OXYGEN Inhale 2 L into the lungs daily.    . potassium chloride (K-DUR) 10 MEQ tablet Take 2 tablets (20 mEq total) by mouth 2 (two) times daily. TAKE AN EXTRA 2 TABLETS (20 MEQ) ON DAY YOU TAKE METOLAZONE 270 tablet 2  . rivaroxaban (XARELTO) 20 MG TABS tablet TAKE ONE TABLET BY MOUTH DAILY WITH SUPPER 90 tablet 1  . temazepam (RESTORIL) 15 MG capsule TAKE 1 CAPSULE AT BEDTIME AS NEEDED  FOR  SLEEP 90 capsule 1  . tiotropium (SPIRIVA HANDIHALER) 18 MCG inhalation capsule PLACE 1 CAPSULE INTO INHALER AND INHALE ONCE DAILY AS  DIRECTED 90 capsule 1  . vitamin B-12 (CYANOCOBALAMIN) 100 MCG tablet Take 100 mcg by mouth daily.     No current facility-administered medications for this visit.     REVIEW OF SYSTEMS:  [X]  denotes positive finding, [ ]  denotes negative finding Cardiac  Comments:  Chest pain or chest pressure:    Shortness of breath upon exertion: x   Short of breath when lying flat: x   Irregular heart rhythm:        Vascular    Pain in calf, thigh, or hip brought on by ambulation:    Pain in feet at night that wakes you up from your sleep:     Blood clot in your veins:    Leg swelling:           PHYSICAL EXAM: Vitals:   07/13/18 1358  BP: 140/77  Pulse: 60  Resp: (!) 24  SpO2: 99%  Weight: 223 lb (101.2 kg)  Height: 5\' 2"  (1.575 m)    GENERAL: The patient is a well-nourished female, in no acute distress. The vital signs are documented above. CARDIOVASCULAR: 2+ radial pulses bilaterally.  I do not palpate pedal pulses. PULMONARY: There is good air exchange  MUSCULOSKELETAL: There are no major deformities or cyanosis. NEUROLOGIC: No focal weakness or paresthesias are detected. SKIN: There are no ulcers or rashes noted. PSYCHIATRIC: The patient has a normal affect.  DATA:  CT scan today was reviewed with the patient.  This shows no enlargement in her saccular aneurysm of her descending thoracic aorta.  Is unchanged from her CT scan 1 year ago.  She has excellent positioning of her infrarenal stent graft with a shrunken sac around the stent graft itself  MEDICAL ISSUES: Stable follow-up of thoracic saccular aneurysm.  Recommend repeat CT scan in 18 months.  Stable follow-up of her prior stent graft infrarenal aortic repair    Rosetta Posner, MD FACS [facility] Tel [phone] Pager [phone]

## 2018-07-30 DIAGNOSIS — H53002 Unspecified amblyopia, left eye: Secondary | ICD-10-CM | POA: Diagnosis not present

## 2018-07-30 DIAGNOSIS — H52203 Unspecified astigmatism, bilateral: Secondary | ICD-10-CM | POA: Diagnosis not present

## 2018-11-04 ENCOUNTER — Other Ambulatory Visit: Payer: Self-pay

## 2018-11-04 ENCOUNTER — Encounter: Payer: Self-pay | Admitting: Internal Medicine

## 2018-11-04 ENCOUNTER — Ambulatory Visit (INDEPENDENT_AMBULATORY_CARE_PROVIDER_SITE_OTHER): Payer: Medicare Other | Admitting: Internal Medicine

## 2018-11-04 DIAGNOSIS — J449 Chronic obstructive pulmonary disease, unspecified: Secondary | ICD-10-CM

## 2018-11-04 DIAGNOSIS — J9611 Chronic respiratory failure with hypoxia: Secondary | ICD-10-CM

## 2018-11-04 NOTE — Progress Notes (Signed)
HPI female former smoker followed for COPD, chronic respiratory failure with hypoxia, OSA/failed CPAP, history lung CA/XRT/chemotherapy, DVT, complicated by AFib, chronic diastolic CHF  6060- Had CT scan of chest 05/06/2011 to followup of her aortic aneurysm. Showed emphysema, stable aneurysm, enlarged right lobe of thyroid, coronary disease. 2013-Cardiology has told her "heart is fine'. Dr Jana Hakim has told her no recurrence of lung Ca Office spirometry 11/28/2014-moderate restriction of exhaled volume and moderate obstruction. FVC 1.75/69%, FEV1 1.26/66%, FEV1/FVC 0.72, FEF 25-75 percent 0.77/47%. Echo 09/17/16-EF 35%-40%, PA pressure 49, IVC dilated consistent with elevated CVP, A. fib PFT 09/17/16-severe obstructive airways disease without response to dilator, severe restriction, severe reduction of diffusion. FVC 1.10/44%, FEV1 0.80/43%, ratio 0.72, TLC 67%, DLCO 25% CT chest HR 09/04/15-. There are findings in the lungs which could indicate interstitial lung disease, such is mild nonspecific interstitial pneumonia  (NSIP).  -----------------------------------------------------------------------------------------------  05/10/2018- 78 year old female former smoker followed for COPD, chronic respiratory failure with hypoxia, ? ILD versus edema, OSA/failed CPAP, complicated by history lung CA/XRT/chemotherapy, DVT, AFib/ Xarelto, chronic diastolic CHF O2 3 L/[facility] -----Emphysema: Pt continues to use O2 through [facility]. Pt states she would like to discuss Trelegy inhaler vs her other inhalers.  Spiriva, albuterol HFA, Pending arrival of re-order Spiriva. She had thought Trelegy sample seemed ok, but doesn't remember it well.   11/04/2018- 78 year old female former smoker followed for COPD, chronic respiratory failure with hypoxia, ? ILD versus edema, OSA/failed CPAP, complicated by history lung CA/XRT/chemotherapy, DVT, AFib/ Xarelto, chronic diastolic CHF,  aortic aneurysm, O2 2- 3 L/[employer] -----pt  states breathing varies, some days are better than others; states she is here bc [employer] told her she needs CY to redo orders for O2 Increased cough with a cold in March. Continues to depend on O2 sleep and any exertion.  CTa chest- 07/13/2018- Lungs/Pleura: There is centrilobular emphysema towards the lung apices. No new lung nodules or masses are identified. No pneumothorax or pleural effusion.  ROS-see HPI + = positive Constitutional:   No-   weight loss, night sweats, fevers, chills, fatigue, lassitude. HEENT:   No-  headaches, difficulty swallowing, tooth/dental problems, sore throat,       No-  sneezing, itching, ear ache, nasal congestion, post nasal drip,  CV:  No-   chest pain, orthopnea, PND,  swelling in lower extremities, No-anasarca, dizziness,                     +Occasional palpitations Resp:    +shortness of breath with exertion or at rest.              No- productive cough,  +non-productive cough,  no-coughing up of blood.              No-   change in color of mucus.  No- wheezing.   Skin: No-   rash or lesions. GI:  No-   heartburn, indigestion, abdominal pain, nausea, vomiting,  GU: N MS:  No-   joint pain or swelling.  + Back pain Neuro-     nothing unusual Psych:  No- change in mood or affect. No depression or anxiety.  No memory loss.  OBJ- Physical Exam   General- Alert, Oriented, Affect-appropriate, Distress- none, + obese. O2 3L/ POC Skin- rash-none, lesions- none, excoriation- none Lymphadenopathy- none Head- atraumatic            Eyes- Gross vision intact, PERRLA, conjunctivae and secretions clear  Ears- Hearing, canals-normal            Nose- Clear, no-Septal dev, mucus, polyps, erosion, perforation             Throat- Mallampati III , mucosa clear , drainage- none, tonsils- atrophic, + dentures Neck- flexible , trachea midline, no stridor , thyroid nl, carotid no bruit Chest - symmetrical excursion , unlabored           Heart/CV- + RRR  no murmur  heard , no gallop  , no rub, nl s1 s2                           - JVD- none ,  edema +2-3 chronic, + stasis changes, varices- none           Lung- clear, wheeze- none, cough- none , dullness-none, rub- none           Chest wall-  Abd-  Br/ Gen/ Rectal- Not done, not indicated Extrem- cyanosis- none, clubbing, none, atrophy- none, strength- nl,+ Stasis changes with heavy legs Neuro- grossly intact to observation

## 2018-11-04 NOTE — Patient Instructions (Signed)
Continue to sleep with oxygen at 3 L, and when needed in home. When out of house use your portable concentrator at 2L.  Ok to continue current meds.   Please call us if we can help

## 2018-11-08 ENCOUNTER — Ambulatory Visit: Payer: Medicare Other | Admitting: Internal Medicine

## 2018-11-29 ENCOUNTER — Other Ambulatory Visit: Payer: Self-pay | Admitting: Internal Medicine

## 2018-12-19 NOTE — Assessment & Plan Note (Signed)
Continues to depend on oxygen- sleep and exertion, including POC 2-3 L w/ exertion.

## 2018-12-19 NOTE — Assessment & Plan Note (Signed)
Viral syndrome exacerbation in March, but near baseline now. Continuing meds.

## 2018-12-30 DIAGNOSIS — M25512 Pain in left shoulder: Secondary | ICD-10-CM | POA: Diagnosis not present

## 2019-01-04 DIAGNOSIS — E038 Other specified hypothyroidism: Secondary | ICD-10-CM | POA: Diagnosis not present

## 2019-01-11 DIAGNOSIS — N950 Postmenopausal bleeding: Secondary | ICD-10-CM | POA: Diagnosis not present

## 2019-01-12 DIAGNOSIS — I509 Heart failure, unspecified: Secondary | ICD-10-CM | POA: Diagnosis not present

## 2019-01-12 DIAGNOSIS — I480 Paroxysmal atrial fibrillation: Secondary | ICD-10-CM | POA: Diagnosis not present

## 2019-01-12 DIAGNOSIS — I10 Essential (primary) hypertension: Secondary | ICD-10-CM | POA: Diagnosis not present

## 2019-01-12 DIAGNOSIS — D508 Other iron deficiency anemias: Secondary | ICD-10-CM | POA: Diagnosis not present

## 2019-01-12 DIAGNOSIS — I714 Abdominal aortic aneurysm, without rupture: Secondary | ICD-10-CM | POA: Diagnosis not present

## 2019-01-12 DIAGNOSIS — E041 Nontoxic single thyroid nodule: Secondary | ICD-10-CM | POA: Diagnosis not present

## 2019-01-12 DIAGNOSIS — Z9981 Dependence on supplemental oxygen: Secondary | ICD-10-CM | POA: Diagnosis not present

## 2019-01-12 DIAGNOSIS — J449 Chronic obstructive pulmonary disease, unspecified: Secondary | ICD-10-CM | POA: Diagnosis not present

## 2019-01-12 DIAGNOSIS — G629 Polyneuropathy, unspecified: Secondary | ICD-10-CM | POA: Diagnosis not present

## 2019-01-12 DIAGNOSIS — N950 Postmenopausal bleeding: Secondary | ICD-10-CM | POA: Diagnosis not present

## 2019-01-12 DIAGNOSIS — E785 Hyperlipidemia, unspecified: Secondary | ICD-10-CM | POA: Diagnosis not present

## 2019-01-12 DIAGNOSIS — E039 Hypothyroidism, unspecified: Secondary | ICD-10-CM | POA: Diagnosis not present

## 2019-01-13 ENCOUNTER — Other Ambulatory Visit: Payer: Self-pay | Admitting: Internal Medicine

## 2019-01-13 DIAGNOSIS — N950 Postmenopausal bleeding: Secondary | ICD-10-CM

## 2019-01-15 ENCOUNTER — Encounter (HOSPITAL_COMMUNITY): Payer: Self-pay

## 2019-01-15 ENCOUNTER — Other Ambulatory Visit: Payer: Self-pay

## 2019-01-15 ENCOUNTER — Emergency Department (HOSPITAL_COMMUNITY)
Admission: EM | Admit: 2019-01-15 | Discharge: 2019-01-15 | Disposition: A | Discharge status: Home / Self Care | Payer: Medicare Other | Attending: Emergency Medicine | Admitting: Emergency Medicine

## 2019-01-15 DIAGNOSIS — Z79899 Other long term (current) drug therapy: Secondary | ICD-10-CM | POA: Insufficient documentation

## 2019-01-15 DIAGNOSIS — Z7901 Long term (current) use of anticoagulants: Secondary | ICD-10-CM | POA: Insufficient documentation

## 2019-01-15 DIAGNOSIS — N938 Other specified abnormal uterine and vaginal bleeding: Secondary | ICD-10-CM | POA: Insufficient documentation

## 2019-01-15 DIAGNOSIS — Z87891 Personal history of nicotine dependence: Secondary | ICD-10-CM | POA: Insufficient documentation

## 2019-01-15 DIAGNOSIS — N939 Abnormal uterine and vaginal bleeding, unspecified: Secondary | ICD-10-CM

## 2019-01-15 LAB — URINALYSIS, ROUTINE W REFLEX MICROSCOPIC
Bacteria, UA: NONE SEEN
Bilirubin Urine: NEGATIVE
Glucose, UA: NEGATIVE mg/dL
Ketones, ur: NEGATIVE mg/dL
Leukocytes,Ua: NEGATIVE
Nitrite: NEGATIVE
Protein, ur: 30 mg/dL — AB
RBC / HPF: >50 RBC/hpf — ABNORMAL HIGH (ref 0–5)
Specific Gravity, Urine: 1.004 — ABNORMAL LOW (ref 1.005–1.030)
WBC, UA: >50 WBC/hpf — ABNORMAL HIGH (ref 0–5)
pH: 6 (ref 5.0–8.0)

## 2019-01-15 LAB — COMPREHENSIVE METABOLIC PANEL
ALT: 18 U/L (ref 0–44)
AST: 25 U/L (ref 15–41)
Albumin: 3.3 g/dL — ABNORMAL LOW (ref 3.5–5.0)
Alkaline Phosphatase: 54 U/L (ref 38–126)
Anion gap: 8 (ref 5–15)
BUN: 11 mg/dL (ref 8–23)
CO2: 27 mmol/L (ref 22–32)
Calcium: 9.2 mg/dL (ref 8.9–10.3)
Chloride: 103 mmol/L (ref 98–111)
Creatinine, Ser: 0.82 mg/dL (ref 0.44–1.00)
GFR calc Af Amer: >60 mL/min (ref >60)
GFR calc non Af Amer: >60 mL/min (ref >60)
Glucose, Bld: 113 mg/dL — ABNORMAL HIGH (ref 70–99)
Potassium: 4.3 mmol/L (ref 3.5–5.1)
Sodium: 138 mmol/L (ref 135–145)
Total Bilirubin: 0.8 mg/dL (ref 0.3–1.2)
Total Protein: 6 g/dL — ABNORMAL LOW (ref 6.5–8.1)

## 2019-01-15 LAB — CBC
HCT: 47 % — ABNORMAL HIGH (ref 36.0–46.0)
Hemoglobin: 15.7 g/dL — ABNORMAL HIGH (ref 12.0–15.0)
MCH: 35.7 pg — ABNORMAL HIGH (ref 26.0–34.0)
MCHC: 33.4 g/dL (ref 30.0–36.0)
MCV: 106.8 fL — ABNORMAL HIGH (ref 80.0–100.0)
Platelets: 131 10*3/uL — ABNORMAL LOW (ref 150–400)
RBC: 4.4 MIL/uL (ref 3.87–5.11)
RDW: 13.1 % (ref 11.5–15.5)
WBC: 5.5 10*3/uL (ref 4.0–10.5)
nRBC: 0 % (ref 0.0–0.2)

## 2019-01-15 MED ORDER — MEGESTROL ACETATE 40 MG PO TABS: 40.0000 mg | ORAL_TABLET | Freq: Four times a day (QID) | ORAL | 0 refills | Status: AC

## 2019-01-15 MED ORDER — MEGESTROL ACETATE 40 MG PO TABS
40.0000 mg | ORAL_TABLET | Freq: Four times a day (QID) | ORAL | Status: DC
Start: 2019-01-15 — End: 2019-01-16
  Administered 2019-01-15: 40 mg via ORAL
  Filled 2019-01-15 (×3): qty 1

## 2019-01-15 NOTE — ED Provider Notes (Signed)
[facility] EMERGENCY DEPARTMENT Provider Note   CSN: [id number] Arrival date & time: 01/15/19  1554    History   Chief Complaint Chief Complaint  Patient presents with  . Vaginal Bleeding    HPI [name] is a 78 y.o. female.     78 y.o female with an extensive PMH including COPD, OSA, AAA, A. fib currently on Xarelto presents to the ED with complaints of vaginal bleeding.  Patient reports she started having vaginal bleeding on 7/16, she reports she was seen by [facility] Dr. Stann Mainland who advised patient she might have uterine cancer, she was referred to Dr. Denman George who she has an appointment scheduled with on upcoming Wednesday.  States since yesterday she is had increased vaginal bleeding, states she is changing 5-6 pads an hour.  She also endorses some lower abdominal cramping, states she also has began to feel lightheaded.  She also has a CT scheduled on August 4.  Patient reports she called the on-call physician for [facility] and advised to be seen in the ED.  The history is provided by the patient and medical records.  Vaginal Bleeding Quality:  Bright red and clots Severity:  Severe Onset quality:  Sudden Duration:  10 days Timing:  Constant Progression:  Worsening Chronicity:  Recurrent Menstrual history:  Postmenopausal Number of pads used:  5-6 Relieved by:  Nothing Worsened by:  Nothing Ineffective treatments:  None tried Associated symptoms: abdominal pain (crampy) and dizziness   Associated symptoms: no back pain, no dysuria, no fever and no nausea     Past Medical History:  Diagnosis Date  . AAA (abdominal aortic aneurysm) ([place])   . Anticoagulant long-term use    Failed on Coumadin. On Xarelto  . Anxiety   . Anxiety and depression   . Atrial fib/flutter, transient June 2012  . Chronic lower back pain   . COPD (chronic obstructive pulmonary disease) ([place])   . DVT (deep venous thrombosis) ([place]) 2004   BLE  . Esophageal  dysmotility   . Exertional dyspnea   . Hiatal hernia   . History of bronchitis   . History of fibrocystic disease of breast   . History of uterine fibroid   . HTN (hypertension)   . Hyperlipidemia   . Hypothyroidism   . Normal nuclear stress test 2012   May 2012  . OA (osteoarthritis)    "knees; left shoulder"  . OSA (obstructive sleep apnea)    "haven't been using my CPAP lately" (01/21/12)  . Ovarian mass    right benign  . Ovarian mass    benign, right  . PAD (peripheral artery disease) ([place])   . Small cell carcinoma of lung ([place]) 2004   NON-SMALL CELL CARCINOMA OF THE LUNG, METASTATIC TO THE SUPRACLAVICULAR AND MEDIASTINAL LYMPH NODES; in remission    Patient Active Problem List   Diagnosis Date Noted  . Hypokalemia   . Acute on chronic combined systolic and diastolic CHF (congestive heart failure) ([place]) 09/15/2016  . Hyperlipidemia   . Chronic respiratory failure with hypoxia ([place]) 12/03/2014  . Insomnia, chronic 01/27/2014  . A-fib ([place])   . Multiple thyroid nodules 10/17/2012  . AAA (abdominal aortic aneurysm) ([place]) 05/11/2012  . Bradycardia 01/23/2012  . Hypothyroid 01/21/2012  . HTN (hypertension) 01/21/2012  . Edema 11/19/2011  . Atrial flutter ([place]) 11/20/2010  . Obstructive sleep apnea 10/16/2008  . DVT 08/19/2008  . COPD mixed type ([place]) 08/19/2008  . NEOPLASM, MALIGNANT, LUNG, HX  OF 08/19/2008  . Diastolic heart failure, NYHA class 2 ([place]) 08/03/2008    Past Surgical History:  Procedure Laterality Date  . ABDOMINAL AORTIC ANEURYSM REPAIR  ~ 2010   stent graft  . BLADDER SURGERY  ~ 2003   sling  . BREAST BIOPSY  1960's   both breast's - benign  . CARDIOVASCULAR STRESS TEST  2012   No ischemia  . CATARACT EXTRACTION W/ INTRAOCULAR LENS  IMPLANT, BILATERAL Bilateral ~ 2009  . REPLACEMENT TOTAL KNEE Left ~ 2008   left  . THYROIDECTOMY  ~ 1964     OB History   No obstetric history on file.      Home Medications    Prior to Admission  medications   Medication Sig Start Date End Date Taking? Authorizing Provider  albuterol (VENTOLIN HFA) 108 (90 Base) MCG/ACT inhaler TAKE 2 PUFFS EVERY 6 HOURS AS NEEDED FORSHORTNESS OF BREATH/WHEEZING 11/27/17   Baird Lyons D, MD  CALCIUM-MAGNESIUM-ZINC PO Take 1 tablet by mouth daily.     [provider]  Cholecalciferol (VITAMIN D) 1000 UNITS capsule Take 1,000 Units by mouth daily.      [provider]  citalopram (CELEXA) 20 MG tablet Take 20 mg by mouth daily.     [provider]  digoxin (LANOXIN) 0.125 MG tablet TAKE 1 TABLET EVERY DAY 03/23/18   [place], Peter M, MD  Fluticasone-Umeclidin-Vilant (TRELEGY ELLIPTA) 100-62.5-25 MCG/INH AEPB Inhale 1 puff into the lungs daily. Patient not taking: Reported on 11/04/2018 05/10/18   Baird Lyons D, MD  furosemide (LASIX) 40 MG tablet TAKE 2 TABLETS TWICE DAILY 03/23/18   [place], Peter M, MD  levothyroxine (SYNTHROID, LEVOTHROID) 88 MCG tablet Take 88 mcg by mouth daily.    [provider]  megestrol (MEGACE) 40 MG tablet Take 1 tablet (40 mg total) by mouth 4 (four) times daily for 20 days. 01/15/19 02/04/19  Janeece Fitting, PA-C  metolazone (ZAROXOLYN) 2.5 MG tablet 1 DAILY FOR 3 DAYS AND THEN ONE AS NEEDED FOR WEIGHT GAIN 02/25/17   [place], Peter M, MD  metoprolol tartrate (LOPRESSOR) 50 MG tablet TAKE 1 TABLET THREE TIMES DAILY 03/23/18   [place], Peter M, MD  morphine (MSIR) 15 MG tablet Take 1 tablet by mouth as needed. 10/13/16   [provider]  OXYGEN Inhale 2 L into the lungs daily.    [provider]  potassium chloride (K-DUR) 10 MEQ tablet Take 2 tablets (20 mEq total) by mouth 2 (two) times daily. TAKE AN EXTRA 2 TABLETS (20 MEQ) ON DAY YOU TAKE METOLAZONE 09/28/17   [place], Peter M, MD  rivaroxaban Alveda Reasons) 20 MG TABS tablet TAKE ONE TABLET BY MOUTH DAILY WITH SUPPER 05/27/18   [place], Peter M, MD  temazepam (RESTORIL) 15 MG capsule TAKE 1 CAPSULE AT BEDTIME AS NEEDED  FOR  SLEEP 07/16/16    Baird Lyons D, MD  tiotropium (SPIRIVA HANDIHALER) 18 MCG inhalation capsule PLACE 1 CAPSULE INTO INHALER AND INHALE THE CONTENTS ONCE DAILY AS DIRECTED 11/29/18   Deneise Lever, MD  vitamin B-12 (CYANOCOBALAMIN) 100 MCG tablet Take 100 mcg by mouth daily.    [provider]    Family History Family History  Problem Relation Age of Onset  . Heart disease Father   . Heart attack Father   . Diabetes type II Mother   . Diabetes Mother   . Hypertension Mother   . Heart disease Brother        before age 66  .  Heart disease Sister        See's Dr. Acie Fredrickson  . Breast cancer Neg Hx     Social History Social History   Tobacco Use  . Smoking status: Former Smoker    Packs/day: 1.00    Years: 40.00    Pack years: 40.00    Types: Cigarettes    Quit date: 06/23/2001    Years since quitting: 17.5  . Smokeless tobacco: Never Used  Substance Use Topics  . Alcohol use: Yes    Alcohol/week: 0.0 standard drinks    Comment: once a week (glass of wine)  . Drug use: No     Allergies   Patient has no known allergies.   Review of Systems Review of Systems  Constitutional: Negative for chills and fever.  HENT: Negative for ear pain and sore throat.   Eyes: Negative for pain and visual disturbance.  Respiratory: Negative for cough and shortness of breath.   Cardiovascular: Negative for chest pain and palpitations.  Gastrointestinal: Positive for abdominal pain (crampy). Negative for nausea and vomiting.  Genitourinary: Positive for vaginal bleeding. Negative for dysuria and hematuria.  Musculoskeletal: Negative for arthralgias and back pain.  Skin: Negative for color change and rash.  Neurological: Positive for dizziness. Negative for seizures and syncope.  All other systems reviewed and are negative.    Physical Exam Updated Vital Signs BP 131/72   Pulse (!) 55   Temp 98.3 F (36.8 C) (Oral)   Resp 17   Ht 5\' 2"  (1.575 m)   Wt 102.5 kg   SpO2 95%   BMI 41.34  kg/m   Physical Exam Vitals signs and nursing note reviewed.  Constitutional:      General: She is not in acute distress.    Appearance: She is well-developed.     Comments: Non-ill-appearing.  HENT:     Head: Normocephalic and atraumatic.     Mouth/Throat:     Pharynx: No oropharyngeal exudate.  Eyes:     Pupils: Pupils are equal, round, and reactive to light.  Neck:     Musculoskeletal: Normal range of motion.  Cardiovascular:     Rate and Rhythm: Regular rhythm.     Heart sounds: Normal heart sounds.  Pulmonary:     Effort: Pulmonary effort is normal. No respiratory distress.     Breath sounds: No wheezing or rhonchi.     Comments: Lungs are diminished.  Normal respiratory effort, currently on 3 L nasal cannula. Abdominal:     General: Bowel sounds are normal. There is no distension.     Palpations: Abdomen is soft.     Tenderness: There is abdominal tenderness in the suprapubic area.     Comments: Mild tenderness to palpation along the suprapubic area.   Musculoskeletal:        General: No tenderness or deformity.     Right lower leg: No edema.     Left lower leg: No edema.  Skin:    General: Skin is warm and dry.  Neurological:     Mental Status: She is alert and oriented to person, place, and time.      ED Treatments / Results  Labs (all labs ordered are listed, but only abnormal results are displayed) Labs Reviewed  COMPREHENSIVE METABOLIC PANEL - Abnormal; Notable for the following components:      Result Value   Glucose, Bld 113 (*)    Total Protein 6.0 (*)    Albumin 3.3 (*)    All  other components within normal limits  CBC - Abnormal; Notable for the following components:   Hemoglobin 15.7 (*)    HCT 47.0 (*)    MCV 106.8 (*)    MCH 35.7 (*)    Platelets 131 (*)    All other components within normal limits  URINALYSIS, ROUTINE W REFLEX MICROSCOPIC - Abnormal; Notable for the following components:   Color, Urine RED (*)    APPearance HAZY (*)     Specific Gravity, Urine 1.004 (*)    Hgb urine dipstick LARGE (*)    Protein, ur 30 (*)    RBC / HPF >50 (*)    WBC, UA >50 (*)    All other components within normal limits  TYPE AND SCREEN    EKG None  Radiology No results found.  Procedures Procedures (including critical care time)  Medications Ordered in ED Medications  megestrol (MEGACE) tablet 40 mg (has no administration in time range)     Initial Impression / Assessment and Plan / ED Course  I have reviewed the triage vital signs and the nursing notes.  Pertinent labs & imaging results that were available during my care of the patient were reviewed by me and considered in my medical decision making (see chart for details).    Patient with a new onset of vaginal bleeding 9 days ago, presents to the ED with worsening bleeding.  Patient is currently on Xarelto for her A. fib.  States she has been changing 5-6 pads an hour.  Currently being followed by [facility] OB[facility] Dr. Stann Mainland, states she also has been feeling lower abdominal cramping, also endorses some dizziness with changes in movement.  Will obtain laboratory results for further screening.  CBC showed a hemoglobin within normal limits at 15.7, hematocrit unremarkable.  CMP showed no electrolyte derangement, current level is within normal limits.  LFTs are unchanged.  UA showed large amount of hemoglobin, greater than 50 red blood count, greater than 50 white blood cell count.  Will touch base with Dr. Virgel Gess from [facility] for their recommendations.   5:19 PM Spoke to Dr. Philis Pique who advised pelvic exam.  Unfortunately due to patient's extensive medical history, she is not a candidate for estrogen, surgery, other methods.  Will assess patient with pelvic exam and follow-up with Dr. Philis Pique via phone.   6:10 PM Pelvic examination performed by me Dr. Theophilus Bones will assess patient in the ED.   Patient was evaluated by Dr. Johnney Killian who presented to the ED, patient  will go home on Megace, 40 mg 4 times a day which she can take up to 30 days.  She will receive her first dose of 40 mg Megace today.  I will write the prescription for the Megace until Dr. Serita Grit appointment, she is to follow-up with her on Wednesday.  Patient was concerned about living alone, will call her daughter to have her stay with her.  Otherwise stable, no fever while in the ED, no tachycardia, patient is at her baseline on oxygen saturation.  At this time will discharge her to follow-up with Dr. Denman George.  Return precautions discussed at length.  Portions of this note were generated with [employer]. Dictation errors may occur despite best attempts at proofreading.    Final Clinical Impressions(s) / ED Diagnoses   Final diagnoses:  Vaginal bleeding    ED Discharge Orders         Ordered    megestrol (MEGACE) 40 MG tablet  4  times daily     01/15/19 1908           Janeece Fitting, Hershal Coria 01/15/19 [place], MD 01/16/19 [phone]

## 2019-01-15 NOTE — Progress Notes (Signed)
Ms. Schirm is an 78 yo known to me from the office at [facility].  She was seen recently for new onset vaginal bleeding and had an [place] in the office that showed a thickened endometrial stripe of 1.6 cm and increased blood flow to the R ovary which was slightly enlarged.  Dr. Stann Mainland was unable to do an endometrial bx in the office.  He consulted with me and we felt pt was not a surgical candidate.  She has been referred to Dr. Everitt Amber of [place] at [facility] and has appt on Wednesday.  Today she called to tell me she had several episodes of standing up and clots coming out.  She was worried she was bleeding to much and I instructed her to go to the ER.   The PA at the ER examined the pt and found she does have active bleeding but not excessive.  I examined the outside vulva and pad and feel that her bleeding is steady but not life-threatening.      Results for orders placed or performed during the hospital encounter of 01/15/19 (from the past 24 hour(s))  Comprehensive metabolic panel     Status: Abnormal   Collection Time: 01/15/19  4:10 PM  Result Value Ref Range   Sodium 138 135 - 145 mmol/L   Potassium 4.3 3.5 - 5.1 mmol/L   Chloride 103 98 - 111 mmol/L   CO2 27 22 - 32 mmol/L   Glucose, Bld 113 (H) 70 - 99 mg/dL   BUN 11 8 - 23 mg/dL   Creatinine, Ser 0.82 0.44 - 1.00 mg/dL   Calcium 9.2 8.9 - 10.3 mg/dL   Total Protein 6.0 (L) 6.5 - 8.1 g/dL   Albumin 3.3 (L) 3.5 - 5.0 g/dL   AST 25 15 - 41 U/L   ALT 18 0 - 44 U/L   Alkaline Phosphatase 54 38 - 126 U/L   Total Bilirubin 0.8 0.3 - 1.2 mg/dL   GFR calc non Af Amer >60 >60 mL/min   GFR calc Af Amer >60 >60 mL/min   Anion gap 8 5 - 15  CBC     Status: Abnormal   Collection Time: 01/15/19  4:10 PM  Result Value Ref Range   WBC 5.5 4.0 - 10.5 K/uL   RBC 4.40 3.87 - 5.11 MIL/uL   Hemoglobin 15.7 (H) 12.0 - 15.0 g/dL   HCT 47.0 (H) 36.0 - 46.0 %   MCV 106.8 (H) 80.0 - 100.0 fL   MCH 35.7 (H) 26.0 - 34.0 pg   MCHC  33.4 30.0 - 36.0 g/dL   RDW 13.1 11.5 - 15.5 %   Platelets 131 (L) 150 - 400 K/uL   nRBC 0.0 0.0 - 0.2 %  Type and screen [place]     Status: None   Collection Time: 01/15/19  4:10 PM  Result Value Ref Range   ABO/RH(D) O POS    Antibody Screen NEG    Sample Expiration      01/18/2019,2359 Performed at [facility], [address], [place], [place] [address]   Urinalysis, Routine w reflex microscopic     Status: Abnormal   Collection Time: 01/15/19  4:41 PM  Result Value Ref Range   Color, Urine RED (A) YELLOW   APPearance HAZY (A) CLEAR   Specific Gravity, Urine 1.004 (L) 1.005 - 1.030   pH 6.0 5.0 - 8.0   Glucose, UA NEGATIVE NEGATIVE mg/dL  Hgb urine dipstick LARGE (A) NEGATIVE   Bilirubin Urine NEGATIVE NEGATIVE   Ketones, ur NEGATIVE NEGATIVE mg/dL   Protein, ur 30 (A) NEGATIVE mg/dL   Nitrite NEGATIVE NEGATIVE   Leukocytes,Ua NEGATIVE NEGATIVE   RBC / HPF >50 (H) 0 - 5 RBC/hpf   WBC, UA >50 (H) 0 - 5 WBC/hpf   Bacteria, UA NONE SEEN NONE SEEN    H/H is stable and I feel that megace may help stem some of the bleeding for her to go home.  She feels a little light headed but the nurses will make sure she is steady on her feet before going home.  She is to call her neighbors and daughter to help her with daily tasks.    She is to f/u with gyn onc on Wednesday.  Daria Pastures

## 2019-01-15 NOTE — ED Triage Notes (Signed)
Pt reports that she started w/ vag bleeding on 7/16, seen by OBGYN this week, but reports that they were concerned for a tumor, but unable to biopsy it. Pt reports appt for CT scan 8/4. States she started passing large blood clots yesterday filling approx 1 pad/hour. Reports bleeding is worsening.

## 2019-01-15 NOTE — ED Notes (Signed)
Patient verbalizes understanding of discharge instructions. Opportunity for questioning and answers were provided. Armband removed by staff, pt discharged from ED via wheelchair to home.  

## 2019-01-15 NOTE — Discharge Instructions (Addendum)
I have prescribed Megace to help with your vaginal bleeding, please take 1 tablet 4 times a day for the next 4 days until seeing Dr. Denman George  Please follow-up with Dr. Denman George at your schedule appointment on Wednesday at 9:30am.  If you experience any dizziness, shortness of breath, worsening symptoms please return to the emergency department.

## 2019-01-15 NOTE — ED Notes (Signed)
Messaged pharmacy to verify medications 

## 2019-01-16 LAB — TYPE AND SCREEN
ABO/RH(D): O POS
Antibody Screen: NEGATIVE

## 2019-01-19 ENCOUNTER — Inpatient Hospital Stay: Payer: Medicare Other | Attending: Gynecologic Oncology | Admitting: Gynecologic Oncology

## 2019-01-19 ENCOUNTER — Other Ambulatory Visit: Payer: Self-pay

## 2019-01-19 VITALS — BP 104/65 | HR 68 | Temp 97.7°F | Resp 19 | Ht 62.0 in | Wt 226.6 lb

## 2019-01-19 DIAGNOSIS — E7800 Pure hypercholesterolemia, unspecified: Secondary | ICD-10-CM | POA: Diagnosis not present

## 2019-01-19 DIAGNOSIS — Z923 Personal history of irradiation: Secondary | ICD-10-CM | POA: Diagnosis not present

## 2019-01-19 DIAGNOSIS — I509 Heart failure, unspecified: Secondary | ICD-10-CM | POA: Insufficient documentation

## 2019-01-19 DIAGNOSIS — E039 Hypothyroidism, unspecified: Secondary | ICD-10-CM | POA: Insufficient documentation

## 2019-01-19 DIAGNOSIS — I714 Abdominal aortic aneurysm, without rupture: Secondary | ICD-10-CM | POA: Diagnosis not present

## 2019-01-19 DIAGNOSIS — Z85118 Personal history of other malignant neoplasm of bronchus and lung: Secondary | ICD-10-CM | POA: Insufficient documentation

## 2019-01-19 DIAGNOSIS — N950 Postmenopausal bleeding: Secondary | ICD-10-CM | POA: Diagnosis not present

## 2019-01-19 DIAGNOSIS — G4733 Obstructive sleep apnea (adult) (pediatric): Secondary | ICD-10-CM | POA: Diagnosis not present

## 2019-01-19 DIAGNOSIS — M1990 Unspecified osteoarthritis, unspecified site: Secondary | ICD-10-CM | POA: Insufficient documentation

## 2019-01-19 DIAGNOSIS — J449 Chronic obstructive pulmonary disease, unspecified: Secondary | ICD-10-CM | POA: Insufficient documentation

## 2019-01-19 DIAGNOSIS — I110 Hypertensive heart disease with heart failure: Secondary | ICD-10-CM | POA: Insufficient documentation

## 2019-01-19 DIAGNOSIS — Z79899 Other long term (current) drug therapy: Secondary | ICD-10-CM | POA: Insufficient documentation

## 2019-01-19 DIAGNOSIS — E785 Hyperlipidemia, unspecified: Secondary | ICD-10-CM | POA: Insufficient documentation

## 2019-01-19 DIAGNOSIS — I739 Peripheral vascular disease, unspecified: Secondary | ICD-10-CM | POA: Diagnosis not present

## 2019-01-19 DIAGNOSIS — Z7901 Long term (current) use of anticoagulants: Secondary | ICD-10-CM | POA: Diagnosis not present

## 2019-01-19 DIAGNOSIS — K449 Diaphragmatic hernia without obstruction or gangrene: Secondary | ICD-10-CM | POA: Insufficient documentation

## 2019-01-19 DIAGNOSIS — Z6841 Body Mass Index (BMI) 40.0 and over, adult: Secondary | ICD-10-CM | POA: Insufficient documentation

## 2019-01-19 DIAGNOSIS — F419 Anxiety disorder, unspecified: Secondary | ICD-10-CM | POA: Insufficient documentation

## 2019-01-19 DIAGNOSIS — Z87891 Personal history of nicotine dependence: Secondary | ICD-10-CM | POA: Insufficient documentation

## 2019-01-19 DIAGNOSIS — E669 Obesity, unspecified: Secondary | ICD-10-CM | POA: Insufficient documentation

## 2019-01-19 DIAGNOSIS — F329 Major depressive disorder, single episode, unspecified: Secondary | ICD-10-CM | POA: Insufficient documentation

## 2019-01-19 DIAGNOSIS — Z9221 Personal history of antineoplastic chemotherapy: Secondary | ICD-10-CM | POA: Diagnosis not present

## 2019-01-19 DIAGNOSIS — N858 Other specified noninflammatory disorders of uterus: Secondary | ICD-10-CM | POA: Diagnosis not present

## 2019-01-19 NOTE — Patient Instructions (Signed)
Dr Denman George performed a biopsy of the endometrium (uterus) today. It is normal to have bleeding after this procedure. You will likely to have persistent bleeding due to the underlying thickened lining of the uterus.  Depending upon the results the options for treatment will be either:  1/ a D&C in the operating room with placement of an IUD to deliver progesterone for 5 years. 2/ placement of the IUD in the office if the anesthesiologists do not think that it is safe to give you anesthesia for the D&C. 3/ continue the tablet progesterone.  Dr Serita Grit office will call you with the results and the plan for moving forward once these results are available. Her office can be reached at [phone].

## 2019-01-20 ENCOUNTER — Other Ambulatory Visit: Payer: Self-pay | Admitting: Oncology

## 2019-01-20 ENCOUNTER — Encounter: Payer: Self-pay | Admitting: Gynecologic Oncology

## 2019-01-20 NOTE — Progress Notes (Signed)
Consult Note: Gyn-Onc  Consult was requested by Dr. Stann Mainland for the evaluation of [name] 78 y.o. female  CC:  Chief Complaint  Patient presents with  . PMB (postmenopausal bleeding)    Assessment/Plan:  [name]  is a 78 y.o.  year old with multiple medical comorbidities and postmenopausal bleeding with a thickened endometrial stripe.  We were able to visualize the cervix and biopsy the endometrium in the office today.  We are following the results of this. Her bleeding is better on megace, but it would be ideal to deliver progesterone with an IUD given the avoidance of systemic progestational effects.  I believe she is a candidate for an office IUD placement, but given the thickened endometrium, it would be ideal to do this in the OR.  We will review the biopsy and if hyperplasia or malignancy is found, we will attempt to obtain anesthesia clearance for MAC anesthesia and D&C IUD placement. If she is not considered a candidate for MAC anesthesia, we will place the IUD in the office.   If malignancy is found, I do not think that she is a good candidate for surgical staging, unless the tumor is noted to be high grade, in which case we would obtain imaging and, if negative for distant disease, plan on primary radiation therapy as progestins are less likely to be effective.  HPI: Ms Leda Ball is a 78 year old woman who is seen in consultation at the request of Dr Stann Mainland for evaluation of postmenopausal bleeding and a thickened endometrial stripe.  The patient reports the onset of bleeding on July 15th, 2020. It was initially heavy and she was seen in the ED. She was started on megace therapy which significantly improved the bleeding A TVUS on 01/05/19 showed a uterus measuring 9.6x5x5cm with a 72m endometrial stripe. The ovaries were normal.   She underwent attempted sampling in her OBGYN's office but the cervix could not be visualized and therefore she was referred for  evaluation and consideration of further workup for a possible malignancy.   The patient has a significant past and current medical history. She is on O2 for COPD and restrictive lung disease. This may in part be secondary to effects of radiation exposure to the chest for a history of a non-small cell lung cancer treated with chemoradiation in 2004 (it was metastatic to the supraclavicular nodes and mediastinal nodes). It has been NED since that time.  She has peripheral vascular disease and a AAA which has been stented (endovascularly).  She is on long term anticoagulation with xarelto for A fib and a history of DVT and PE in 2004.  She has congestive heart failure and takes digoxin.  Additionally she is obese with a BMI of 41kg/m2.  She has HTN and hypercholesterolemia.   Her surgical history is signficant for a bladder sling placement.   She has had 2 prior vaginal deliveries.   Current Meds:  Outpatient Encounter Medications as of 01/19/2019  Medication Sig  . albuterol (VENTOLIN HFA) 108 (90 Base) MCG/ACT inhaler TAKE 2 PUFFS EVERY 6 HOURS AS NEEDED FORSHORTNESS OF BREATH/WHEEZING  . CALCIUM-MAGNESIUM-ZINC PO Take 1 tablet by mouth daily.   . Cholecalciferol (VITAMIN D) 1000 UNITS capsule Take 1,000 Units by mouth daily.    . citalopram (CELEXA) 20 MG tablet Take 20 mg by mouth daily.   . digoxin (LANOXIN) 0.125 MG tablet TAKE 1 TABLET EVERY DAY  . furosemide (LASIX) 40 MG tablet TAKE 2  TABLETS TWICE DAILY  . levothyroxine (SYNTHROID, LEVOTHROID) 88 MCG tablet Take 88 mcg by mouth daily.  . megestrol (MEGACE) 40 MG tablet Take 1 tablet (40 mg total) by mouth 4 (four) times daily for 20 days.  . metolazone (ZAROXOLYN) 2.5 MG tablet 1 DAILY FOR 3 DAYS AND THEN ONE AS NEEDED FOR WEIGHT GAIN  . metoprolol tartrate (LOPRESSOR) 50 MG tablet TAKE 1 TABLET THREE TIMES DAILY  . morphine (MSIR) 15 MG tablet Take 1 tablet by mouth as needed.  . OXYGEN Inhale 2 L into the lungs daily.  . potassium  chloride (K-DUR) 10 MEQ tablet Take 2 tablets (20 mEq total) by mouth 2 (two) times daily. TAKE AN EXTRA 2 TABLETS (20 MEQ) ON DAY YOU TAKE METOLAZONE  . rivaroxaban (XARELTO) 20 MG TABS tablet TAKE ONE TABLET BY MOUTH DAILY WITH SUPPER  . temazepam (RESTORIL) 15 MG capsule TAKE 1 CAPSULE AT BEDTIME AS NEEDED  FOR  SLEEP  . tiotropium (SPIRIVA HANDIHALER) 18 MCG inhalation capsule PLACE 1 CAPSULE INTO INHALER AND INHALE THE CONTENTS ONCE DAILY AS DIRECTED  . vitamin B-12 (CYANOCOBALAMIN) 100 MCG tablet Take 100 mcg by mouth daily.  . [DISCONTINUED] Fluticasone-Umeclidin-Vilant (TRELEGY ELLIPTA) 100-62.5-25 MCG/INH AEPB Inhale 1 puff into the lungs daily. (Patient not taking: Reported on 11/04/2018)   No facility-administered encounter medications on file as of 01/19/2019.     Allergy: No Known Allergies  Social Hx:   Social History   Socioeconomic History  . Marital status: Divorced    Spouse name: Not on file  . Number of children: 2  . Years of education: Not on file  . Highest education level: Not on file  Occupational History  . Occupation: retired    Comment: Chiropractor  Social Needs  . Financial resource strain: Not on file  . Food insecurity    Worry: Not on file    Inability: Not on file  . Transportation needs    Medical: Not on file    Non-medical: Not on file  Tobacco Use  . Smoking status: Former Smoker    Packs/day: 1.00    Years: 40.00    Pack years: 40.00    Types: Cigarettes    Quit date: 06/23/2001    Years since quitting: 17.5  . Smokeless tobacco: Never Used  Substance and Sexual Activity  . Alcohol use: Yes    Alcohol/week: 0.0 standard drinks    Comment: once a week (glass of wine)  . Drug use: No  . Sexual activity: Never  Lifestyle  . Physical activity    Days per week: Not on file    Minutes per session: Not on file  . Stress: Not on file  Relationships  . Social Herbalist on phone: Not on file    Gets together: Not on file     Attends religious service: Not on file    Active member of club or organization: Not on file    Attends meetings of clubs or organizations: Not on file    Relationship status: Not on file  . Intimate partner violence    Fear of current or ex partner: Not on file    Emotionally abused: Not on file    Physically abused: Not on file    Forced sexual activity: Not on file  Other Topics Concern  . Not on file  Social History Narrative  . Not on file    Past Surgical Hx:  Past Surgical History:  Procedure  Laterality Date  . ABDOMINAL AORTIC ANEURYSM REPAIR  ~ 2010   stent graft  . BLADDER SURGERY  ~ 2003   sling  . BREAST BIOPSY  1960's   both breast's - benign  . CARDIOVASCULAR STRESS TEST  2012   No ischemia  . CATARACT EXTRACTION W/ INTRAOCULAR LENS  IMPLANT, BILATERAL Bilateral ~ 2009  . REPLACEMENT TOTAL KNEE Left ~ 2008   left  . THYROIDECTOMY  ~ 1964    Past Medical Hx:  Past Medical History:  Diagnosis Date  . AAA (abdominal aortic aneurysm) ([place])   . Anticoagulant long-term use    Failed on Coumadin. On Xarelto  . Anxiety   . Anxiety and depression   . Atrial fib/flutter, transient June 2012  . Chronic lower back pain   . COPD (chronic obstructive pulmonary disease) ([place])   . DVT (deep venous thrombosis) ([place]) 2004   BLE  . Esophageal dysmotility   . Exertional dyspnea   . Hiatal hernia   . History of bronchitis   . History of fibrocystic disease of breast   . History of uterine fibroid   . HTN (hypertension)   . Hyperlipidemia   . Hypothyroidism   . Normal nuclear stress test 2012   May 2012  . OA (osteoarthritis)    "knees; left shoulder"  . OSA (obstructive sleep apnea)    "haven't been using my CPAP lately" (01/21/12)  . Ovarian mass    right benign  . Ovarian mass    benign, right  . PAD (peripheral artery disease) ([place])   . Small cell carcinoma of lung ([place]) 2004   NON-SMALL CELL CARCINOMA OF THE LUNG, METASTATIC TO THE SUPRACLAVICULAR AND  MEDIASTINAL LYMPH NODES; in remission    Past Gynecological History:  See HPI No LMP recorded. Patient is postmenopausal.  Family Hx:  Family History  Problem Relation Age of Onset  . Heart disease Father   . Heart attack Father   . Diabetes type II Mother   . Diabetes Mother   . Hypertension Mother   . Heart disease Brother        before age 54  . Heart disease Sister        See's Dr. Acie Fredrickson  . Breast cancer Neg Hx     Review of Systems:  Constitutional  Feels well,   ENT Normal appearing ears and nares bilaterally Skin/Breast  No rash, sores, jaundice, itching, dryness Cardiovascular  No chest pain, +shortness of breath, + edema  Pulmonary  + cough and wheeze.  Gastro Intestinal  No nausea, vomitting, or diarrhoea. No bright red blood per rectum, no abdominal pain, change in bowel movement, or constipation.  Genito Urinary  No frequency, urgency, dysuria, + bleeding Musculo Skeletal  + back pain  Neurologic  No weakness, numbness, change in gait,  Psychology  No depression, anxiety, insomnia.   Vitals:  Blood pressure 104/65, pulse 68, temperature 97.7 F (36.5 C), temperature source Oral, resp. rate 19, height _0  (1.575 m), weight 226 lb 9.6 oz (102.8 kg), SpO2 94 %.  Physical Exam: WD in NAD Neck  Supple NROM, without any enlargements.  Lymph Node Survey No cervical supraclavicular or inguinal adenopathy Cardiovascular  Pulse normal rate, irregularity. S1 and S2 normal.  Lungs  Decreased BS bilaterally Skin  No rash/lesions/breakdown  Psychiatry  Alert and oriented to person, place, and time  Abdomen  Normoactive bowel sounds, abdomen soft, non-tender and obese without evidence of hernia.  Back No CVA tenderness  Genito Urinary  Vulva/vagina: Normal external female genitalia.  No lesions. No discharge or bleeding.  Bladder/urethra:  No lesions or masses, well supported bladder  Vagina: normal  Cervix: Normal appearing, no lesions.  Uterus:  Slightly enlarged, mobile, no parametrial involvement or nodularity.  Adnexa: no palpable masses. Rectal  deferred  Extremities  No bilateral cyanosis, clubbing or edema.   Procedure Note:  Preop Dx: postmenopausal bleeding, thickened endometrium Postop Dx: same Procedure: endometrial biopsy Surgeon: Dorann Ou, MD EBL: minimal Specimens: endometrial biopsy Complications: none Procedure Details: The patient provided verbal consent and verbal time out was performed. The speculum was placed and the cervix visualized. The tenaculum grasped the anterior lip of the cervix. The pipelle was inserted to a fundal depth of 9cm. 5 passes of hte pipelle were made due to the significant blood clot in the specimen. An ECC was used to gently curette the endocervix to yield more tissue.  Hemostasis was observed. The patient tolerated the procedure well.    Thereasa Solo, MD  01/20/2019, 5:41 PM

## 2019-01-25 ENCOUNTER — Other Ambulatory Visit: Payer: Self-pay

## 2019-01-25 ENCOUNTER — Ambulatory Visit
Admission: RE | Admit: 2019-01-25 | Discharge: 2019-01-25 | Disposition: A | Discharge status: Home / Self Care | Payer: Medicare Other | Source: Ambulatory Visit | Attending: Internal Medicine | Admitting: Internal Medicine

## 2019-01-25 DIAGNOSIS — K760 Fatty (change of) liver, not elsewhere classified: Secondary | ICD-10-CM | POA: Diagnosis not present

## 2019-01-25 DIAGNOSIS — N950 Postmenopausal bleeding: Secondary | ICD-10-CM

## 2019-01-25 MED ORDER — IOPAMIDOL (ISOVUE-300) INJECTION 61%
100.0000 mL | Freq: Once | INTRAVENOUS | Status: AC | PRN
  Administered 2019-01-25: 100 mL via INTRAVENOUS

## 2019-01-26 ENCOUNTER — Other Ambulatory Visit: Payer: Self-pay | Admitting: Gynecologic Oncology

## 2019-01-26 ENCOUNTER — Telehealth: Payer: Self-pay | Admitting: Gynecologic Oncology

## 2019-01-26 DIAGNOSIS — N950 Postmenopausal bleeding: Secondary | ICD-10-CM

## 2019-01-26 MED ORDER — LEVONORGESTREL 20 MCG/24HR IU IUD: 1.0000 | INTRAUTERINE_SYSTEM | Freq: Once | INTRAUTERINE | 0 refills | Status: DC

## 2019-01-26 NOTE — Progress Notes (Signed)
Bx reviewed by Dr. Denman George who recommends IUD placement in the office.

## 2019-01-26 NOTE — Telephone Encounter (Signed)
Informed patient of biopsy results and Dr. Serita Grit recommendations for IUD placement in the office.  Advised patient that a prior Josem Kaufmann was obtained from her insurance for the [place] IUD but she would still have a $500.60 copay per [employer].    Patient states that she has stopped bleeding since taking the Megace and wants to see about holding off on IUD placement. She also states she is really not in a place for be about to afford $500 for the IUD.  Advised patient that I would discuss with Dr. Denman George and review her recent CT with Dr. Denman George as well, then our office would reach back out with her recommendations.  No other concerns voiced.

## 2019-01-28 ENCOUNTER — Telehealth: Payer: Self-pay | Admitting: Gynecologic Oncology

## 2019-01-28 NOTE — Telephone Encounter (Signed)
Called patient and informed her that Dr. Denman George said it was fine for her to continue taking the Megace and to follow up with Dr. Stann Mainland for management of her bleeding if it returned and her Megace use.  All questions answered. Advised to call for any needs.

## 2019-02-17 ENCOUNTER — Ambulatory Visit: Payer: Medicare Other | Admitting: Internal Medicine

## 2019-03-18 ENCOUNTER — Other Ambulatory Visit: Payer: Self-pay | Admitting: Cardiology

## 2019-03-23 DIAGNOSIS — Z79891 Long term (current) use of opiate analgesic: Secondary | ICD-10-CM | POA: Diagnosis not present

## 2019-03-23 DIAGNOSIS — M5136 Other intervertebral disc degeneration, lumbar region: Secondary | ICD-10-CM | POA: Diagnosis not present

## 2019-03-25 ENCOUNTER — Other Ambulatory Visit: Payer: Self-pay

## 2019-03-25 ENCOUNTER — Other Ambulatory Visit: Payer: Self-pay | Admitting: Internal Medicine

## 2019-03-25 DIAGNOSIS — Z20822 Contact with and (suspected) exposure to covid-19: Secondary | ICD-10-CM

## 2019-03-25 DIAGNOSIS — Z1231 Encounter for screening mammogram for malignant neoplasm of breast: Secondary | ICD-10-CM

## 2019-03-26 LAB — NOVEL CORONAVIRUS, NAA: SARS-CoV-2, NAA: NOT DETECTED

## 2019-04-15 DIAGNOSIS — Z23 Encounter for immunization: Secondary | ICD-10-CM | POA: Diagnosis not present

## 2019-04-29 ENCOUNTER — Other Ambulatory Visit: Payer: Self-pay | Admitting: Cardiology

## 2019-04-29 MED ORDER — POTASSIUM CHLORIDE ER 10 MEQ PO TBCR: 20.0000 meq | EXTENDED_RELEASE_TABLET | Freq: Two times a day (BID) | ORAL | 0 refills | Status: DC

## 2019-04-29 NOTE — Telephone Encounter (Signed)
Rx(s) sent to pharmacy electronically.  

## 2019-04-29 NOTE — Telephone Encounter (Signed)
New Message    *STAT* If patient is at the pharmacy, call can be transferred to refill team.   1. Which medications need to be refilled? (please list name of each medication and dose if known) potassium chloride (K-DUR) 10 MEQ tablet    2. Which pharmacy/location (including street and city if local pharmacy) is medication to be sent to? [id number] - [place], [place] - [address]  3. Do they need a 30 day or 90 day supply? [place]

## 2019-05-10 ENCOUNTER — Telehealth: Payer: Self-pay | Admitting: Cardiology

## 2019-05-10 NOTE — Telephone Encounter (Signed)
Patient calling the office for samples of medication:   1.  What medication and dosage are you requesting samples for? rivaroxaban (XARELTO) 20 MG TABS tablet  2.  Are you currently out of this medication? yes

## 2019-05-11 ENCOUNTER — Ambulatory Visit
Admission: RE | Admit: 2019-05-11 | Discharge: 2019-05-11 | Disposition: A | Discharge status: Home / Self Care | Payer: Medicare Other | Source: Ambulatory Visit | Attending: Internal Medicine | Admitting: Internal Medicine

## 2019-05-11 ENCOUNTER — Other Ambulatory Visit: Payer: Self-pay

## 2019-05-11 DIAGNOSIS — Z1231 Encounter for screening mammogram for malignant neoplasm of breast: Secondary | ICD-10-CM | POA: Diagnosis not present

## 2019-05-12 NOTE — Telephone Encounter (Signed)
No samples available. lmtcb to offer patient assistance through manufacturer.

## 2019-05-13 ENCOUNTER — Other Ambulatory Visit: Payer: Self-pay | Admitting: Cardiology

## 2019-05-13 MED ORDER — RIVAROXABAN 20 MG PO TABS: ORAL_TABLET | ORAL | 1 refills | Status: DC

## 2019-05-13 MED ORDER — RIVAROXABAN 20 MG PO TABS: ORAL_TABLET | ORAL | 0 refills | Status: DC

## 2019-05-13 NOTE — Telephone Encounter (Signed)
°*  STAT* If patient is at the pharmacy, call can be transferred to refill team.   1. Which medications need to be refilled? (please list name of each medication and dose if known) rivaroxaban (XARELTO) 20 MG TABS tablet  2. Which pharmacy/location (including street and city if local pharmacy) is medication to be sent to? [place], [place]  3. Do they need a 30 day or 90 day supply? [place]

## 2019-05-13 NOTE — Telephone Encounter (Signed)
Patient call back. States she cannot get patient assistance because she has insurance. Asked that I just send in a refill request so I advised her I would.

## 2019-05-13 NOTE — Telephone Encounter (Signed)
RX sent to pharmacy on file

## 2019-05-16 ENCOUNTER — Encounter: Payer: Self-pay | Admitting: Internal Medicine

## 2019-05-16 ENCOUNTER — Other Ambulatory Visit: Payer: Self-pay

## 2019-05-16 ENCOUNTER — Ambulatory Visit (INDEPENDENT_AMBULATORY_CARE_PROVIDER_SITE_OTHER): Payer: Medicare Other | Admitting: Internal Medicine

## 2019-05-16 DIAGNOSIS — J449 Chronic obstructive pulmonary disease, unspecified: Secondary | ICD-10-CM

## 2019-05-16 DIAGNOSIS — J9611 Chronic respiratory failure with hypoxia: Secondary | ICD-10-CM

## 2019-05-16 NOTE — Progress Notes (Signed)
HPI female former smoker followed for COPD, chronic respiratory failure with hypoxia, OSA/failed CPAP, history lung CA/XRT/chemotherapy, DVT, complicated by AFib, chronic diastolic CHF  3419- Had CT scan of chest 05/06/2011 to followup of her aortic aneurysm. Showed emphysema, stable aneurysm, enlarged right lobe of thyroid, coronary disease. 2013-Cardiology has told her "heart is fine'. Dr Jana Hakim has told her no recurrence of lung Ca Office spirometry 11/28/2014-moderate restriction of exhaled volume and moderate obstruction. FVC 1.75/69%, FEV1 1.26/66%, FEV1/FVC 0.72, FEF 25-75 percent 0.77/47%. Echo 09/17/16-EF 35%-40%, PA pressure 49, IVC dilated consistent with elevated CVP, A. fib PFT 09/17/16-severe obstructive airways disease without response to dilator, severe restriction, severe reduction of diffusion. FVC 1.10/44%, FEV1 0.80/43%, ratio 0.72, TLC 67%, DLCO 25% CT chest HR 09/04/15-. There are findings in the lungs which could indicate interstitial lung disease, such is mild nonspecific interstitial pneumonia  (NSIP).  -----------------------------------------------------------------------------------------------   11/04/2018- 78 year old female former smoker followed for COPD, chronic respiratory failure with hypoxia, ? ILD versus edema, OSA/failed CPAP, complicated by history lung CA/XRT/chemotherapy, DVT, AFib/ Xarelto, chronic diastolic CHF,  aortic aneurysm, O2 2- 3 L/[facility] -----pt states breathing varies, some days are better than others; states she is here bc [employer] told her she needs CY to redo orders for O2 Increased cough with a cold in March. Continues to depend on O2 sleep and any exertion.  CTa chest- 07/13/2018- Lungs/Pleura: There is centrilobular emphysema towards the lung apices. No new lung nodules or masses are identified. No pneumothorax or pleural effusion.  05/16/2019- Virtual Visit via Telephone Note  I connected with [name] on 05/16/19 at  1:30 PM EST by  telephone and verified that I am speaking with the correct person using two identifiers.  Location: Patient: H Provider: O   I discussed the limitations, risks, security and privacy concerns of performing an evaluation and management service by telephone and the availability of in person appointments. I also discussed with the patient that there may be a patient responsible charge related to this service. The patient expressed understanding and agreed to proceed.   History of Present Illness: 78 year old female former smoker followed for COPD, chronic respiratory failure with hypoxia, ? ILD versus edema, OSA/failed CPAP, complicated by history lung CA/XRT/chemotherapy, DVT, AFib/ Xarelto, chronic diastolic CHF,  aortic aneurysm, O2 2- 3 L/[facility]                      Had flu vax albut hfa, Spiriva,  -----Patient reports that her breathing is doing well at this time. She reports that she wears her oxygen at night and as needed during the day.  O2 concentrator now positioned so cord can reach around her home- easier to put it on as needed during the day. Keeps portable tank in car. Uses albuterol hfa 1-2x/ week.  Grieving- brother died, daughter has a bone marrow cancer. No important changes in her own health.    Observations/Objective:   Assessment and Plan: Chronic resp failure/ hypoxia- Continues to benefit from O2 COPD - uncomplicated- current meds appropriate  Follow Up Instructions: 6 months   I discussed the assessment and treatment plan with the patient. The patient was provided an opportunity to ask questions and all were answered. The patient agreed with the plan and demonstrated an understanding of the instructions.   The patient was advised to call back or seek an in-person evaluation if the symptoms worsen or if the condition fails to improve as anticipated.  I provided 18 minutes of  non-face-to-face time during this encounter.   Baird Lyons, MD    ROS-see HPI + =  positive Constitutional:   No-   weight loss, night sweats, fevers, chills, fatigue, lassitude. HEENT:   No-  headaches, difficulty swallowing, tooth/dental problems, sore throat,       No-  sneezing, itching, ear ache, nasal congestion, post nasal drip,  CV:  No-   chest pain, orthopnea, PND,  swelling in lower extremities, No-anasarca, dizziness,                     +Occasional palpitations Resp:    +shortness of breath with exertion or at rest.              No- productive cough,  +non-productive cough,  no-coughing up of blood.              No-   change in color of mucus.  No- wheezing.   Skin: No-   rash or lesions. GI:  No-   heartburn, indigestion, abdominal pain, nausea, vomiting,  GU: N MS:  No-   joint pain or swelling.  + Back pain Neuro-     nothing unusual Psych:  No- change in mood or affect. No depression or anxiety.  No memory loss.  OBJ- Physical Exam   General- Alert, Oriented, Affect-appropriate, Distress- none, + obese. O2 3L/ POC Skin- rash-none, lesions- none, excoriation- none Lymphadenopathy- none Head- atraumatic            Eyes- Gross vision intact, PERRLA, conjunctivae and secretions clear            Ears- Hearing, canals-normal            Nose- Clear, no-Septal dev, mucus, polyps, erosion, perforation             Throat- Mallampati III , mucosa clear , drainage- none, tonsils- atrophic, + dentures Neck- flexible , trachea midline, no stridor , thyroid nl, carotid no bruit Chest - symmetrical excursion , unlabored           Heart/CV- + RRR  no murmur heard , no gallop  , no rub, nl s1 s2                           - JVD- none ,  edema +2-3 chronic, + stasis changes, varices- none           Lung- clear, wheeze- none, cough- none , dullness-none, rub- none           Chest wall-  Abd-  Br/ Gen/ Rectal- Not done, not indicated Extrem- cyanosis- none, clubbing, none, atrophy- none, strength- nl,+ Stasis changes with heavy legs Neuro- grossly intact to  observation

## 2019-05-16 NOTE — Assessment & Plan Note (Signed)
No exacerbation. Current meds are appropriate

## 2019-05-16 NOTE — Patient Instructions (Signed)
I was sorry to hear about your brother and your daughter. I hope the winter is kind to you.  We can continue your oxygen and current breathing meds.  Please call if we can help

## 2019-05-16 NOTE — Assessment & Plan Note (Signed)
Continues to need O2 for sleep and as needed Plan- continue 2L

## 2019-05-23 ENCOUNTER — Other Ambulatory Visit: Payer: Self-pay | Admitting: Cardiology

## 2019-05-26 DIAGNOSIS — L820 Inflamed seborrheic keratosis: Secondary | ICD-10-CM | POA: Diagnosis not present

## 2019-06-10 ENCOUNTER — Other Ambulatory Visit: Payer: Self-pay | Admitting: Cardiology

## 2019-06-10 ENCOUNTER — Telehealth: Payer: Self-pay | Admitting: Internal Medicine

## 2019-06-10 NOTE — Telephone Encounter (Signed)
Attempted to call pt X2, could hear that phone was picked up but was promptly hung up.  Wcb.

## 2019-06-13 NOTE — Telephone Encounter (Signed)
Can also call cell # [phone]

## 2019-06-13 NOTE — Telephone Encounter (Signed)
ATC pt x2, the line was picked up but then was disconnected. Will try back.

## 2019-06-13 NOTE — Telephone Encounter (Signed)
Called spoke with patient Appt scheduled with Beth NP for Wednesday 12.23.20 for qualifying walk.  Nothing further needed; will sign off.

## 2019-06-13 NOTE — Telephone Encounter (Signed)
Pt called back stating she never got a call back.  Let her know that we attempted several times.  [phone].  Pt states she needs qualifying oxygen test, clinical office notes and prescription for oxygen.

## 2019-06-15 ENCOUNTER — Encounter: Payer: Self-pay | Admitting: Primary Care

## 2019-06-15 ENCOUNTER — Ambulatory Visit (INDEPENDENT_AMBULATORY_CARE_PROVIDER_SITE_OTHER): Payer: Medicare Other | Admitting: Primary Care

## 2019-06-15 ENCOUNTER — Other Ambulatory Visit: Payer: Self-pay

## 2019-06-15 DIAGNOSIS — J449 Chronic obstructive pulmonary disease, unspecified: Secondary | ICD-10-CM | POA: Diagnosis not present

## 2019-06-15 DIAGNOSIS — Z7951 Long term (current) use of inhaled steroids: Secondary | ICD-10-CM

## 2019-06-15 DIAGNOSIS — J9611 Chronic respiratory failure with hypoxia: Secondary | ICD-10-CM | POA: Diagnosis not present

## 2019-06-15 DIAGNOSIS — R5381 Other malaise: Secondary | ICD-10-CM

## 2019-06-15 DIAGNOSIS — Z9981 Dependence on supplemental oxygen: Secondary | ICD-10-CM | POA: Diagnosis not present

## 2019-06-15 MED ORDER — STIOLTO RESPIMAT 2.5-2.5 MCG/ACT IN AERS: 2.0000 | INHALATION_SPRAY | Freq: Every day | RESPIRATORY_TRACT | 0 refills | Status: DC

## 2019-06-15 NOTE — Addendum Note (Signed)
Addended by: Lia Foyer R on: 06/15/2019 02:22 PM   Modules accepted: Orders

## 2019-06-15 NOTE — Assessment & Plan Note (Signed)
-   Re-qualified; O2 desaturated 70s on 3L with ambulation - Needs 4L on exertion; continue 2L at night  - Renew oxygen order with [employer]

## 2019-06-15 NOTE — Assessment & Plan Note (Signed)
-   Experiences moderate-severe dyspnea on exertion  - NO evidence of COPD exacerbation  - Stop Spiriva; trial Stiolto respimat (samples given) - FU in 4 weeks

## 2019-06-15 NOTE — Addendum Note (Signed)
Addended by: Lia Foyer R on: 06/15/2019 05:27 PM   Modules accepted: Orders

## 2019-06-15 NOTE — Progress Notes (Addendum)
@Patient  ID: [name], female    DOB: 1940-09-10, 78 y.o.   MRN: [id number]  Chief Complaint  Patient presents with  . Follow-up    Patient here to requalify for oxygen.    Referring provider: Burnard Bunting, MD  HPI: 78 year old female, former smoker. PMH significant for COPD mixed type, OSA/failed CPAP, chronic respiratory failure, ? ILD versus edema, lung cancer s/p XRT/chemotherapy, DVT, HTN, CAD, diastolic heart failure, aflutter, aortic aneurysm. Patient of Dr. Annamaria Boots, last seen on 05/16/19.   06/15/2019 Patient presents today to re-qualify for oxygen. She is at her baseline health, no changes to her breathing. She experiences shortness of breath with minimal exertion which does limit her activity level. Currently maintained on Spiriva handihaler only. She has tried Trelegy in the past but had difficulty opening and was afraid she was wasting medication. She is currently using 2L oxygen at home during the day and at night. When she leaves her house she uses 3L on her portable cylinder. She does not have help at home. 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 Denies cough or mucus production, wheezing, chest tightness, chest pain.    Pulmonary function testing:  PFT 09/17/16-severe obstructive airways disease without response to dilator, severe restriction, severe reduction of diffusion. FVC 1.10/44%, FEV1 0.80/43%, ratio 0.72, TLC 67%, DLCO 25%  Office spirometry 11/28/2014-moderate restriction of exhaled volume and moderate obstruction. FVC 1.75/69%, FEV1 1.26/66%, FEV1/FVC 0.72, FEF 25-75 percent 0.77/47%.  Cardiac:  Echo 09/17/16-EF 35%-40%, PA pressure 49, IVC dilated consistent with elevated CVP, A. fib  Imaging: CT chest HR 09/04/15-. There are findings in the lungs which could indicate interstitial lung disease, such is mild nonspecific interstitial pneumonia  (NSIP).   2012- Had CT scan of chest 05/06/2011 to followup of her aortic aneurysm.  Showed emphysema, stable aneurysm, enlarged right lobe of thyroid, coronary disease.  No Known Allergies  Immunization History  Administered Date(s) Administered  . Influenza Split 03/23/2013, 03/22/2014  . Influenza Whole 04/07/2011, 03/23/2012  . Influenza, High Dose Seasonal PF 04/23/2016, 03/23/2017, 04/12/2018, 04/15/2019  . Influenza,inj,Quad PF,6+ Mos 04/24/2015  . Influenza,inj,quad, With Preservative 04/12/2018  . Influenza-Unspecified 03/23/2017  . Pneumococcal Conjugate-13 05/16/2013    Past Medical History:  Diagnosis Date  . AAA (abdominal aortic aneurysm) ([place])   . Anticoagulant long-term use    Failed on Coumadin. On Xarelto  . Anxiety   . Anxiety and depression   . Atrial fib/flutter, transient June 2012  . Chronic lower back pain   . COPD (chronic obstructive pulmonary disease) ([place])   . DVT (deep venous thrombosis) ([place]) 2004   BLE  . Esophageal dysmotility   . Exertional dyspnea   . Hiatal hernia   . History of bronchitis   . History of fibrocystic disease of breast   . History of uterine fibroid   . HTN (hypertension)   . Hyperlipidemia   . Hypothyroidism   . Normal nuclear stress test 2012   May 2012  . OA (osteoarthritis)    "knees; left shoulder"  . OSA (obstructive sleep apnea)    "haven't been using my CPAP lately" (01/21/12)  . Ovarian mass    right benign  . Ovarian mass    benign, right  . PAD (peripheral artery disease) ([place])   . Small cell carcinoma of lung ([place]) 2004   NON-SMALL CELL CARCINOMA OF THE LUNG, METASTATIC TO THE SUPRACLAVICULAR AND MEDIASTINAL LYMPH NODES; in remission    Tobacco History:  Social History   Tobacco Use  Smoking Status Former Smoker  . Packs/day: 1.00  . Years: 40.00  . Pack years: 40.00  . Types: Cigarettes  . Quit date: 06/23/2001  . Years since quitting: 17.9  Smokeless Tobacco Never Used   Counseling given: Not Answered   Outpatient Medications Prior to Visit  Medication Sig Dispense Refill    . albuterol (VENTOLIN HFA) 108 (90 Base) MCG/ACT inhaler TAKE 2 PUFFS EVERY 6 HOURS AS NEEDED FORSHORTNESS OF BREATH/WHEEZING 18 g 4  . CALCIUM-MAGNESIUM-ZINC PO Take 1 tablet by mouth daily.     . Cholecalciferol (VITAMIN D) 1000 UNITS capsule Take 1,000 Units by mouth daily.      . citalopram (CELEXA) 20 MG tablet Take 20 mg by mouth daily.     . digoxin (LANOXIN) 0.125 MG tablet Take 1 tablet (125 mcg total) by mouth daily. NEED OV. 90 tablet 0  . furosemide (LASIX) 40 MG tablet Take 2 tablets (80 mg total) by mouth 2 (two) times daily. NEED OV. 360 tablet 0  . gabapentin (NEURONTIN) 100 MG capsule Take 1 capsule by mouth 3 (three) times daily.    Marland Kitchen levothyroxine (SYNTHROID, LEVOTHROID) 88 MCG tablet Take 88 mcg by mouth daily.    . megestrol (MEGACE) 40 MG tablet Take 40 mg by mouth daily.     . metolazone (ZAROXOLYN) 2.5 MG tablet 1 DAILY FOR 3 DAYS AND THEN ONE AS NEEDED FOR WEIGHT GAIN 30 tablet 2  . metoprolol tartrate (LOPRESSOR) 50 MG tablet Take 1 tablet (50 mg total) by mouth 3 (three) times daily. NEED OV. 270 tablet 0  . morphine (MSIR) 15 MG tablet Take 1 tablet by mouth as needed.    . OXYGEN Inhale 2 L into the lungs daily. Patient states she wears 2L at night and 3L when up and moving    . potassium chloride (KLOR-CON) 10 MEQ tablet PLEASE SEE ATTACHED FOR DETAILED DIRECTIONS 135 tablet 0  . rivaroxaban (XARELTO) 20 MG TABS tablet TAKE ONE TABLET BY MOUTH DAILY WITH SUPPER. MUST SCHEDULE MD APPT FOR REFILLS 30 tablet 0  . temazepam (RESTORIL) 15 MG capsule TAKE 1 CAPSULE AT BEDTIME AS NEEDED  FOR  SLEEP 90 capsule 1  . vitamin B-12 (CYANOCOBALAMIN) 100 MCG tablet Take 100 mcg by mouth daily.    Marland Kitchen tiotropium (SPIRIVA HANDIHALER) 18 MCG inhalation capsule PLACE 1 CAPSULE INTO INHALER AND INHALE THE CONTENTS ONCE DAILY AS DIRECTED 90 capsule 1   No facility-administered medications prior to visit.   Review of Systems  Review of Systems  Constitutional: Negative.    Respiratory: Positive for shortness of breath. Negative for cough, chest tightness and wheezing.    Physical Exam  BP 126/80 (BP Location: Left Arm, Patient Position: Sitting, Cuff Size: Large)   Pulse (!) 112   Temp (!) 97 F (36.1 C) (Temporal)   Ht 5\' 2"  (1.575 m)   Wt 225 lb 12.8 oz (102.4 kg)   SpO2 96% Comment: 4L  BMI 41.30 kg/m  Physical Exam Constitutional:      Appearance: Normal appearance.  HENT:     Head: Normocephalic and atraumatic.     Mouth/Throat:     Comments: Deferred d/t masking Cardiovascular:     Rate and Rhythm: Regular rhythm. Tachycardia present.  Pulmonary:     Effort: No respiratory distress.     Breath sounds: Wheezing present.     Comments: Dyspnea with talking/ on exertion  Neurological:     General: No  focal deficit present.     Mental Status: She is alert and oriented to person, place, and time. Mental status is at baseline.  Psychiatric:        Mood and Affect: Mood normal.        Behavior: Behavior normal.        Thought Content: Thought content normal.        Judgment: Judgment normal.      Lab Results:  CBC    Component Value Date/Time   WBC 5.5 01/15/2019 1610   RBC 4.40 01/15/2019 1610   HGB 15.7 (H) 01/15/2019 1610   HGB 15.6 04/12/2008 1118   HCT 47.0 (H) 01/15/2019 1610   HCT 44.5 04/12/2008 1118   PLT 131 (L) 01/15/2019 1610   PLT 135 (L) 04/12/2008 1118   MCV 106.8 (H) 01/15/2019 1610   MCV 104.2 (H) 04/12/2008 1118   MCH 35.7 (H) 01/15/2019 1610   MCHC 33.4 01/15/2019 1610   RDW 13.1 01/15/2019 1610   RDW 13.7 04/12/2008 1118   LYMPHSABS 2.1 07/27/2014 1521   LYMPHSABS 1.6 04/12/2008 1118   MONOABS 0.7 07/27/2014 1521   MONOABS 0.6 04/12/2008 1118   EOSABS 0.1 07/27/2014 1521   EOSABS 0.1 04/12/2008 1118   BASOSABS 0.0 07/27/2014 1521   BASOSABS 0.0 04/12/2008 1118    BMET    Component Value Date/Time   NA 138 01/15/2019 1610   NA 139 02/20/2017 1411   K 4.3 01/15/2019 1610   CL 103 01/15/2019 1610    CO2 27 01/15/2019 1610   GLUCOSE 113 (H) 01/15/2019 1610   BUN 11 01/15/2019 1610   BUN 10 02/20/2017 1411   CREATININE 0.82 01/15/2019 1610   CREATININE 1.06 (H) 10/07/2016 1530   CALCIUM 9.2 01/15/2019 1610   GFRNONAA >60 01/15/2019 1610   GFRAA >60 01/15/2019 1610    BNP    Component Value Date/Time   BNP 169.5 (H) 02/20/2017 1411   BNP 348.5 (H) 09/15/2016 2055    ProBNP    Component Value Date/Time   PROBNP 311.0 (H) 04/12/2015 1720    Imaging: No results found.   Assessment & Plan:   COPD mixed type ([place]) - Experiences moderate-severe dyspnea on exertion  - NO evidence of COPD exacerbation  - Stop Spiriva; trial Stiolto respimat (samples given) - FU in 4 weeks   Chronic respiratory failure with hypoxia ([facility]) - Re-qualified; O2 desaturated 70s on 3L with ambulation - Needs 4L on exertion; continue 2L at night  - Renew oxygen order with [employer]   Physical deconditioning - [place] last month out of bed and was unable to get up - Ambulated with walker, gets short of breath with minimal activity  - She would benefit from home physical therapy and nursing services    [name], NP 06/16/2019

## 2019-06-15 NOTE — Patient Instructions (Addendum)
Recommendations: STOP Spiriva handihaler Trial Stiolto- 2 puffs twice daily (samples given) Continue Albuterol rescue inhaler 2 puffs every 6 hours as needed for breakthrough shortness of breath/wheezing   Referral: Social work re: resources d/t COPD   Order: Oxygen renewal- needs to 4L during day and 2L at night   Send Oxygen order to:  Cherylann Banas at [facility]- fax [phone] Needs clinical office note, Prescription for oxygen, Qualifying walk information  Follow-up: 1 month with Dr. Annamaria Boots or Eustaquio Maize NP

## 2019-06-16 DIAGNOSIS — R5381 Other malaise: Secondary | ICD-10-CM | POA: Insufficient documentation

## 2019-06-16 NOTE — Assessment & Plan Note (Addendum)
-   [place] last month out of bed and was unable to get up - Ambulated with walker, gets short of breath with minimal activity  - She would benefit from home physical therapy and nursing services

## 2019-06-22 NOTE — Addendum Note (Signed)
Addended by: Jannette Spanner on: 06/22/2019 11:54 AM   Modules accepted: Orders

## 2019-06-27 DIAGNOSIS — I4891 Unspecified atrial fibrillation: Secondary | ICD-10-CM | POA: Diagnosis not present

## 2019-06-27 DIAGNOSIS — J439 Emphysema, unspecified: Secondary | ICD-10-CM | POA: Diagnosis not present

## 2019-06-27 DIAGNOSIS — M19012 Primary osteoarthritis, left shoulder: Secondary | ICD-10-CM | POA: Diagnosis not present

## 2019-06-27 DIAGNOSIS — G4733 Obstructive sleep apnea (adult) (pediatric): Secondary | ICD-10-CM | POA: Diagnosis not present

## 2019-06-27 DIAGNOSIS — M545 Low back pain: Secondary | ICD-10-CM | POA: Diagnosis not present

## 2019-06-27 DIAGNOSIS — R296 Repeated falls: Secondary | ICD-10-CM | POA: Diagnosis not present

## 2019-06-27 DIAGNOSIS — Z79818 Long term (current) use of other agents affecting estrogen receptors and estrogen levels: Secondary | ICD-10-CM | POA: Diagnosis not present

## 2019-06-27 DIAGNOSIS — M170 Bilateral primary osteoarthritis of knee: Secondary | ICD-10-CM | POA: Diagnosis not present

## 2019-06-27 DIAGNOSIS — G8929 Other chronic pain: Secondary | ICD-10-CM | POA: Diagnosis not present

## 2019-06-27 DIAGNOSIS — I110 Hypertensive heart disease with heart failure: Secondary | ICD-10-CM | POA: Diagnosis not present

## 2019-06-27 DIAGNOSIS — I2510 Atherosclerotic heart disease of native coronary artery without angina pectoris: Secondary | ICD-10-CM | POA: Diagnosis not present

## 2019-06-27 DIAGNOSIS — F419 Anxiety disorder, unspecified: Secondary | ICD-10-CM | POA: Diagnosis not present

## 2019-06-27 DIAGNOSIS — K449 Diaphragmatic hernia without obstruction or gangrene: Secondary | ICD-10-CM | POA: Diagnosis not present

## 2019-06-27 DIAGNOSIS — I714 Abdominal aortic aneurysm, without rupture: Secondary | ICD-10-CM | POA: Diagnosis not present

## 2019-06-27 DIAGNOSIS — E785 Hyperlipidemia, unspecified: Secondary | ICD-10-CM | POA: Diagnosis not present

## 2019-06-27 DIAGNOSIS — J9611 Chronic respiratory failure with hypoxia: Secondary | ICD-10-CM | POA: Diagnosis not present

## 2019-06-27 DIAGNOSIS — Z9981 Dependence on supplemental oxygen: Secondary | ICD-10-CM | POA: Diagnosis not present

## 2019-06-27 DIAGNOSIS — F329 Major depressive disorder, single episode, unspecified: Secondary | ICD-10-CM | POA: Diagnosis not present

## 2019-06-27 DIAGNOSIS — J449 Chronic obstructive pulmonary disease, unspecified: Secondary | ICD-10-CM | POA: Diagnosis not present

## 2019-06-27 DIAGNOSIS — E039 Hypothyroidism, unspecified: Secondary | ICD-10-CM | POA: Diagnosis not present

## 2019-06-27 DIAGNOSIS — I4892 Unspecified atrial flutter: Secondary | ICD-10-CM | POA: Diagnosis not present

## 2019-06-27 DIAGNOSIS — I5030 Unspecified diastolic (congestive) heart failure: Secondary | ICD-10-CM | POA: Diagnosis not present

## 2019-06-27 DIAGNOSIS — Z7901 Long term (current) use of anticoagulants: Secondary | ICD-10-CM | POA: Diagnosis not present

## 2019-06-27 DIAGNOSIS — Z79891 Long term (current) use of opiate analgesic: Secondary | ICD-10-CM | POA: Diagnosis not present

## 2019-06-27 DIAGNOSIS — Z9181 History of falling: Secondary | ICD-10-CM | POA: Diagnosis not present

## 2019-06-27 DIAGNOSIS — I739 Peripheral vascular disease, unspecified: Secondary | ICD-10-CM | POA: Diagnosis not present

## 2019-06-29 ENCOUNTER — Other Ambulatory Visit: Payer: Self-pay

## 2019-06-29 ENCOUNTER — Encounter: Payer: Self-pay | Admitting: Podiatry

## 2019-06-29 ENCOUNTER — Ambulatory Visit (INDEPENDENT_AMBULATORY_CARE_PROVIDER_SITE_OTHER): Payer: Medicare Other | Admitting: Podiatry

## 2019-06-29 ENCOUNTER — Ambulatory Visit (INDEPENDENT_AMBULATORY_CARE_PROVIDER_SITE_OTHER): Payer: Medicare Other

## 2019-06-29 ENCOUNTER — Other Ambulatory Visit: Payer: Self-pay | Admitting: Podiatry

## 2019-06-29 VITALS — BP 108/50 | HR 54 | Resp 18

## 2019-06-29 DIAGNOSIS — M722 Plantar fascial fibromatosis: Secondary | ICD-10-CM | POA: Diagnosis not present

## 2019-06-29 DIAGNOSIS — M778 Other enthesopathies, not elsewhere classified: Secondary | ICD-10-CM

## 2019-06-29 NOTE — Progress Notes (Signed)
Subjective:  Patient ID: [name], female    DOB: Jun 14, 1941,  MRN: [id number] HPI Chief Complaint  Patient presents with  . Foot Pain    Left foot - extremely swollen and painful x 2 weeks, no injury, says foot hurts all over but mostly forefoot (dorsal) and big toe, takes Tylenol arthritis  . New Patient (Initial Visit)    79 y.o. female presents with the above complaint.   ROS: Denies fever chills nausea vomiting muscle aches pains calf pain back pain chest pain shortness of breath.  Past Medical History:  Diagnosis Date  . AAA (abdominal aortic aneurysm) ([place])   . Anticoagulant long-term use    Failed on Coumadin. On Xarelto  . Anxiety   . Anxiety and depression   . Atrial fib/flutter, transient June 2012  . Chronic lower back pain   . COPD (chronic obstructive pulmonary disease) ([place])   . DVT (deep venous thrombosis) ([place]) 2004   BLE  . Esophageal dysmotility   . Exertional dyspnea   . Hiatal hernia   . History of bronchitis   . History of fibrocystic disease of breast   . History of uterine fibroid   . HTN (hypertension)   . Hyperlipidemia   . Hypothyroidism   . Normal nuclear stress test 2012   May 2012  . OA (osteoarthritis)    "knees; left shoulder"  . OSA (obstructive sleep apnea)    "haven't been using my CPAP lately" (01/21/12)  . Ovarian mass    right benign  . Ovarian mass    benign, right  . PAD (peripheral artery disease) ([place])   . Small cell carcinoma of lung ([place]) 2004   NON-SMALL CELL CARCINOMA OF THE LUNG, METASTATIC TO THE SUPRACLAVICULAR AND MEDIASTINAL LYMPH NODES; in remission   Past Surgical History:  Procedure Laterality Date  . ABDOMINAL AORTIC ANEURYSM REPAIR  ~ 2010   stent graft  . BLADDER SURGERY  ~ 2003   sling  . BREAST BIOPSY  1960's   both breast's - benign  . CARDIOVASCULAR STRESS TEST  2012   No ischemia  . CATARACT EXTRACTION W/ INTRAOCULAR LENS  IMPLANT, BILATERAL Bilateral ~ 2009  . REPLACEMENT TOTAL KNEE Left  ~ 2008   left  . THYROIDECTOMY  ~ 1964    Current Outpatient Medications:  .  albuterol (VENTOLIN HFA) 108 (90 Base) MCG/ACT inhaler, TAKE 2 PUFFS EVERY 6 HOURS AS NEEDED FORSHORTNESS OF BREATH/WHEEZING, Disp: 18 g, Rfl: 4 .  CALCIUM-MAGNESIUM-ZINC PO, Take 1 tablet by mouth daily. , Disp: , Rfl:  .  Cholecalciferol (VITAMIN D) 1000 UNITS capsule, Take 1,000 Units by mouth daily.  , Disp: , Rfl:  .  citalopram (CELEXA) 20 MG tablet, Take 20 mg by mouth daily. , Disp: , Rfl:  .  digoxin (LANOXIN) 0.125 MG tablet, Take 1 tablet (125 mcg total) by mouth daily. NEED OV., Disp: 90 tablet, Rfl: 0 .  furosemide (LASIX) 40 MG tablet, Take 2 tablets (80 mg total) by mouth 2 (two) times daily. NEED OV., Disp: 360 tablet, Rfl: 0 .  gabapentin (NEURONTIN) 100 MG capsule, Take 1 capsule by mouth 3 (three) times daily., Disp: , Rfl:  .  levothyroxine (SYNTHROID, LEVOTHROID) 88 MCG tablet, Take 88 mcg by mouth daily., Disp: , Rfl:  .  megestrol (MEGACE) 40 MG tablet, Take 40 mg by mouth daily. , Disp: , Rfl:  .  metolazone (ZAROXOLYN) 2.5 MG tablet, 1 DAILY FOR 3 DAYS AND THEN ONE AS NEEDED  FOR WEIGHT GAIN, Disp: 30 tablet, Rfl: 2 .  metoprolol tartrate (LOPRESSOR) 50 MG tablet, Take 1 tablet (50 mg total) by mouth 3 (three) times daily. NEED OV., Disp: 270 tablet, Rfl: 0 .  morphine (MSIR) 15 MG tablet, Take 1 tablet by mouth as needed., Disp: , Rfl:  .  mupirocin ointment (BACTROBAN) 2 %, mupirocin 2 % topical ointment  APPLY TO WOUNDS ON SKIN ONCE DAILY AT BANDAGE CHANGES, Disp: , Rfl:  .  OXYGEN, Inhale 2 L into the lungs daily. Patient states she wears 2L at night and 3L when up and moving, Disp: , Rfl:  .  potassium chloride (KLOR-CON) 10 MEQ tablet, PLEASE SEE ATTACHED FOR DETAILED DIRECTIONS, Disp: 135 tablet, Rfl: 0 .  rivaroxaban (XARELTO) 20 MG TABS tablet, TAKE ONE TABLET BY MOUTH DAILY WITH SUPPER. MUST SCHEDULE MD APPT FOR REFILLS, Disp: 30 tablet, Rfl: 0 .  temazepam (RESTORIL) 15 MG capsule,  TAKE 1 CAPSULE AT BEDTIME AS NEEDED  FOR  SLEEP, Disp: 90 capsule, Rfl: 1 .  Tiotropium Bromide-Olodaterol (STIOLTO RESPIMAT) 2.5-2.5 MCG/ACT AERS, Inhale 2 puffs into the lungs daily., Disp: 4 g, Rfl: 0 .  vitamin B-12 (CYANOCOBALAMIN) 100 MCG tablet, Take 100 mcg by mouth daily., Disp: , Rfl:   No Known Allergies Review of Systems Objective:   Vitals:   06/29/19 1402  BP: (!) 108/50  Pulse: (!) 54  Resp: 18    General: Well developed, nourished, in no acute distress, alert and oriented x3   Dermatological: Skin is warm, dry and supple bilateral. Nails x 10 are well maintained; remaining integument appears unremarkable at this time. There are no open sores, no preulcerative lesions, no rash or signs of infection present.  Vascular: Dorsalis Pedis artery and Posterior Tibial artery pedal pulses are 2/4 bilateral with immedate capillary fill time. Pedal hair growth present. No varicosities and no lower extremity edema present bilateral.   Neruologic: Grossly intact via light touch bilateral. Vibratory intact via tuning fork bilateral. Protective threshold with Semmes Wienstein monofilament intact to all pedal sites bilateral. Patellar and Achilles deep tendon reflexes 2+ bilateral. No Babinski or clonus noted bilateral.   Musculoskeletal: No gross boney pedal deformities bilateral. No pain, crepitus, or limitation noted with foot and ankle range of motion bilateral. Muscular strength 5/5 in all groups tested bilateral.  Pain on palpation medial calcaneal tubercle of the left heel.  Gait: Unassisted, Nonantalgic.    Radiographs:  Radiographs taken today demonstrate considerable edema to the left lower extremity however there does not appear to be any type of acute abnormalities present.  No fractures are identified.  She does have some soft tissue swelling at the plantar fascial K insertion site with moderate pes planus.  Assessment & Plan:   Assessment: Discussed etiology pathology  conservative surgical therapies more than likely plantar fasciitis resulting in lateral compensatory syndrome consistent with her lateral pain.  Plan: Discussed etiology pathology conservative surgical therapies at this point time injected her left heel medial aspect.  We discussed appropriate shoe gear stretching size ice therapy shoe gear modifications.     Asiana Benninger T. [place], [place]

## 2019-06-30 DIAGNOSIS — R296 Repeated falls: Secondary | ICD-10-CM | POA: Diagnosis not present

## 2019-06-30 DIAGNOSIS — I2510 Atherosclerotic heart disease of native coronary artery without angina pectoris: Secondary | ICD-10-CM | POA: Diagnosis not present

## 2019-06-30 DIAGNOSIS — I5030 Unspecified diastolic (congestive) heart failure: Secondary | ICD-10-CM | POA: Diagnosis not present

## 2019-06-30 DIAGNOSIS — J9611 Chronic respiratory failure with hypoxia: Secondary | ICD-10-CM | POA: Diagnosis not present

## 2019-06-30 DIAGNOSIS — I110 Hypertensive heart disease with heart failure: Secondary | ICD-10-CM | POA: Diagnosis not present

## 2019-06-30 DIAGNOSIS — J439 Emphysema, unspecified: Secondary | ICD-10-CM | POA: Diagnosis not present

## 2019-07-01 DIAGNOSIS — J439 Emphysema, unspecified: Secondary | ICD-10-CM | POA: Diagnosis not present

## 2019-07-01 DIAGNOSIS — R296 Repeated falls: Secondary | ICD-10-CM | POA: Diagnosis not present

## 2019-07-01 DIAGNOSIS — I2510 Atherosclerotic heart disease of native coronary artery without angina pectoris: Secondary | ICD-10-CM | POA: Diagnosis not present

## 2019-07-01 DIAGNOSIS — I110 Hypertensive heart disease with heart failure: Secondary | ICD-10-CM | POA: Diagnosis not present

## 2019-07-01 DIAGNOSIS — J9611 Chronic respiratory failure with hypoxia: Secondary | ICD-10-CM | POA: Diagnosis not present

## 2019-07-01 DIAGNOSIS — I5030 Unspecified diastolic (congestive) heart failure: Secondary | ICD-10-CM | POA: Diagnosis not present

## 2019-07-04 ENCOUNTER — Telehealth: Payer: Self-pay | Admitting: Primary Care

## 2019-07-04 DIAGNOSIS — J439 Emphysema, unspecified: Secondary | ICD-10-CM | POA: Diagnosis not present

## 2019-07-04 DIAGNOSIS — I5030 Unspecified diastolic (congestive) heart failure: Secondary | ICD-10-CM | POA: Diagnosis not present

## 2019-07-04 DIAGNOSIS — R296 Repeated falls: Secondary | ICD-10-CM | POA: Diagnosis not present

## 2019-07-04 DIAGNOSIS — J9611 Chronic respiratory failure with hypoxia: Secondary | ICD-10-CM | POA: Diagnosis not present

## 2019-07-04 DIAGNOSIS — I2510 Atherosclerotic heart disease of native coronary artery without angina pectoris: Secondary | ICD-10-CM | POA: Diagnosis not present

## 2019-07-04 DIAGNOSIS — I110 Hypertensive heart disease with heart failure: Secondary | ICD-10-CM | POA: Diagnosis not present

## 2019-07-04 NOTE — Telephone Encounter (Signed)
I called and spoke with Erline Levine and approved a verbal order for PT per Dr. Annamaria Boots.

## 2019-07-04 NOTE — Telephone Encounter (Signed)
CY please advise if ok to give verbal order for PT: Requesting PT 2x/week X3 weeks, and then 1x/week X4 weeks.  Also requesting a VO for an incentive spirometer.  Thanks!

## 2019-07-04 NOTE — Telephone Encounter (Signed)
Both orders are ok- thanks

## 2019-07-05 DIAGNOSIS — R296 Repeated falls: Secondary | ICD-10-CM | POA: Diagnosis not present

## 2019-07-05 DIAGNOSIS — I110 Hypertensive heart disease with heart failure: Secondary | ICD-10-CM | POA: Diagnosis not present

## 2019-07-05 DIAGNOSIS — J439 Emphysema, unspecified: Secondary | ICD-10-CM | POA: Diagnosis not present

## 2019-07-05 DIAGNOSIS — J9611 Chronic respiratory failure with hypoxia: Secondary | ICD-10-CM | POA: Diagnosis not present

## 2019-07-05 DIAGNOSIS — I2510 Atherosclerotic heart disease of native coronary artery without angina pectoris: Secondary | ICD-10-CM | POA: Diagnosis not present

## 2019-07-05 DIAGNOSIS — I5030 Unspecified diastolic (congestive) heart failure: Secondary | ICD-10-CM | POA: Diagnosis not present

## 2019-07-06 DIAGNOSIS — I8311 Varicose veins of right lower extremity with inflammation: Secondary | ICD-10-CM | POA: Diagnosis not present

## 2019-07-06 DIAGNOSIS — D225 Melanocytic nevi of trunk: Secondary | ICD-10-CM | POA: Diagnosis not present

## 2019-07-06 DIAGNOSIS — I872 Venous insufficiency (chronic) (peripheral): Secondary | ICD-10-CM | POA: Diagnosis not present

## 2019-07-06 DIAGNOSIS — D2261 Melanocytic nevi of right upper limb, including shoulder: Secondary | ICD-10-CM | POA: Diagnosis not present

## 2019-07-06 DIAGNOSIS — I8312 Varicose veins of left lower extremity with inflammation: Secondary | ICD-10-CM | POA: Diagnosis not present

## 2019-07-06 DIAGNOSIS — L814 Other melanin hyperpigmentation: Secondary | ICD-10-CM | POA: Diagnosis not present

## 2019-07-06 DIAGNOSIS — L821 Other seborrheic keratosis: Secondary | ICD-10-CM | POA: Diagnosis not present

## 2019-07-07 DIAGNOSIS — R296 Repeated falls: Secondary | ICD-10-CM | POA: Diagnosis not present

## 2019-07-07 DIAGNOSIS — J439 Emphysema, unspecified: Secondary | ICD-10-CM | POA: Diagnosis not present

## 2019-07-07 DIAGNOSIS — I2510 Atherosclerotic heart disease of native coronary artery without angina pectoris: Secondary | ICD-10-CM | POA: Diagnosis not present

## 2019-07-07 DIAGNOSIS — I5030 Unspecified diastolic (congestive) heart failure: Secondary | ICD-10-CM | POA: Diagnosis not present

## 2019-07-07 DIAGNOSIS — I110 Hypertensive heart disease with heart failure: Secondary | ICD-10-CM | POA: Diagnosis not present

## 2019-07-07 DIAGNOSIS — J9611 Chronic respiratory failure with hypoxia: Secondary | ICD-10-CM | POA: Diagnosis not present

## 2019-07-11 DIAGNOSIS — J9611 Chronic respiratory failure with hypoxia: Secondary | ICD-10-CM | POA: Diagnosis not present

## 2019-07-11 DIAGNOSIS — J439 Emphysema, unspecified: Secondary | ICD-10-CM | POA: Diagnosis not present

## 2019-07-11 DIAGNOSIS — R296 Repeated falls: Secondary | ICD-10-CM | POA: Diagnosis not present

## 2019-07-11 DIAGNOSIS — I5030 Unspecified diastolic (congestive) heart failure: Secondary | ICD-10-CM | POA: Diagnosis not present

## 2019-07-11 DIAGNOSIS — I110 Hypertensive heart disease with heart failure: Secondary | ICD-10-CM | POA: Diagnosis not present

## 2019-07-11 DIAGNOSIS — I2510 Atherosclerotic heart disease of native coronary artery without angina pectoris: Secondary | ICD-10-CM | POA: Diagnosis not present

## 2019-07-13 DIAGNOSIS — J439 Emphysema, unspecified: Secondary | ICD-10-CM | POA: Diagnosis not present

## 2019-07-13 DIAGNOSIS — I2510 Atherosclerotic heart disease of native coronary artery without angina pectoris: Secondary | ICD-10-CM | POA: Diagnosis not present

## 2019-07-13 DIAGNOSIS — I110 Hypertensive heart disease with heart failure: Secondary | ICD-10-CM | POA: Diagnosis not present

## 2019-07-13 DIAGNOSIS — I5030 Unspecified diastolic (congestive) heart failure: Secondary | ICD-10-CM | POA: Diagnosis not present

## 2019-07-13 DIAGNOSIS — J9611 Chronic respiratory failure with hypoxia: Secondary | ICD-10-CM | POA: Diagnosis not present

## 2019-07-13 DIAGNOSIS — R296 Repeated falls: Secondary | ICD-10-CM | POA: Diagnosis not present

## 2019-07-14 DIAGNOSIS — J439 Emphysema, unspecified: Secondary | ICD-10-CM | POA: Diagnosis not present

## 2019-07-14 DIAGNOSIS — R296 Repeated falls: Secondary | ICD-10-CM | POA: Diagnosis not present

## 2019-07-14 DIAGNOSIS — I5030 Unspecified diastolic (congestive) heart failure: Secondary | ICD-10-CM | POA: Diagnosis not present

## 2019-07-14 DIAGNOSIS — I110 Hypertensive heart disease with heart failure: Secondary | ICD-10-CM | POA: Diagnosis not present

## 2019-07-14 DIAGNOSIS — I2510 Atherosclerotic heart disease of native coronary artery without angina pectoris: Secondary | ICD-10-CM | POA: Diagnosis not present

## 2019-07-14 DIAGNOSIS — J9611 Chronic respiratory failure with hypoxia: Secondary | ICD-10-CM | POA: Diagnosis not present

## 2019-07-18 ENCOUNTER — Ambulatory Visit (INDEPENDENT_AMBULATORY_CARE_PROVIDER_SITE_OTHER): Payer: Medicare Other | Admitting: Primary Care

## 2019-07-18 ENCOUNTER — Ambulatory Visit: Payer: Medicare Other | Admitting: Primary Care

## 2019-07-18 ENCOUNTER — Encounter: Payer: Self-pay | Admitting: Primary Care

## 2019-07-18 ENCOUNTER — Other Ambulatory Visit: Payer: Self-pay

## 2019-07-18 ENCOUNTER — Telehealth: Payer: Self-pay | Admitting: Cardiology

## 2019-07-18 DIAGNOSIS — J439 Emphysema, unspecified: Secondary | ICD-10-CM | POA: Diagnosis not present

## 2019-07-18 DIAGNOSIS — R5381 Other malaise: Secondary | ICD-10-CM | POA: Diagnosis not present

## 2019-07-18 DIAGNOSIS — R296 Repeated falls: Secondary | ICD-10-CM | POA: Diagnosis not present

## 2019-07-18 DIAGNOSIS — J449 Chronic obstructive pulmonary disease, unspecified: Secondary | ICD-10-CM

## 2019-07-18 DIAGNOSIS — J9611 Chronic respiratory failure with hypoxia: Secondary | ICD-10-CM | POA: Diagnosis not present

## 2019-07-18 DIAGNOSIS — I2510 Atherosclerotic heart disease of native coronary artery without angina pectoris: Secondary | ICD-10-CM | POA: Diagnosis not present

## 2019-07-18 DIAGNOSIS — I110 Hypertensive heart disease with heart failure: Secondary | ICD-10-CM | POA: Diagnosis not present

## 2019-07-18 DIAGNOSIS — I5030 Unspecified diastolic (congestive) heart failure: Secondary | ICD-10-CM | POA: Diagnosis not present

## 2019-07-18 MED ORDER — TIOTROPIUM BROMIDE-OLODATEROL 2.5-2.5 MCG/ACT IN AERS: 2.0000 | INHALATION_SPRAY | Freq: Every day | RESPIRATORY_TRACT | 5 refills | Status: DC

## 2019-07-18 NOTE — Telephone Encounter (Signed)
We have not seen her since October 2019. For now can reduce metoprolol to 50 mg bid. Needs OV with Ecg and labs including BMET and Dig level.  Lis Savitt Martinique MD, [facility]

## 2019-07-18 NOTE — Telephone Encounter (Signed)
Left message for Ria Comment

## 2019-07-18 NOTE — Telephone Encounter (Signed)
Returned call to nurse, Mendel Ryder [facility] Nurse) relayed message from Dr Martinique since I was unable to contact pt. She states that she will let pt know and have her call to schedule appt and just let her know and she can draw the required labs that Dr Dr Martinique wants to have done before the appt. Will await call from pt to schedule appt and have lab work done

## 2019-07-18 NOTE — Telephone Encounter (Signed)
New Message  STAT if HR is under 50 or over 120 (normal HR is 60-100 beats per minute)  1) What is your heart rate? 44  2) Do you have a log of your heart rate readings (document readings)? No  3) Do you have any other symptoms? No

## 2019-07-18 NOTE — Telephone Encounter (Signed)
Thanks  Collier Salina

## 2019-07-18 NOTE — Telephone Encounter (Signed)
Spoke with patient and she is currently taking Metoprolol 50 mg twice a day not 3 times a day. Advised to decrease to 50 mg 1/2 tablet twice a day and scheduled visit 07/21/2019 with Blima Ledger NP

## 2019-07-18 NOTE — Patient Instructions (Addendum)
Rx: Sent in prescription for Stiolto- take two puffs once daily in the morning  Orders: - Flutter valve- use this 3-4 times a day for chest congestion   (Nursing or PT can help show you how to use, basically you blow into it to create vibration in your chest to loosen mucus)  Follow-up: 4 months with Dr. Annamaria Boots (already scheduled)

## 2019-07-18 NOTE — Progress Notes (Addendum)
Virtual Visit via Telephone Note  I connected with [name] on 07/18/19 at  2:00 PM EST by telephone and verified that I am speaking with the correct person using two identifiers.  Location: Patient: Home Provider: Office   I discussed the limitations, risks, security and privacy concerns of performing an evaluation and management service by telephone and the availability of in person appointments. I also discussed with the patient that there may be a patient responsible charge related to this service. The patient expressed understanding and agreed to proceed.   History of Present Illness: 79 year old female, former smoker. PMH significant for COPD mixed type, OSA/failed CPAP, chronic respiratory failure, ? ILD versus edema, lung cancer s/p XRT/chemotherapy, DVT, HTN, CAD, AFIB, chronic diastolic heart failure, aortic aneurysm. Patient of Dr. Annamaria Boots, last seen on 05/16/19.    Spiriva changed to [facility] in December 2020.  Received first covid vaccine 07/15/19 and second is scheduled for 08/05/19.  Previous [facility] pulmonary encounters: 06/15/2019 Patient presents today to re-qualify for oxygen. She is at her baseline health, no changes to her breathing. She experiences shortness of breath with minimal exertion which does limit her activity level. Currently maintained on Spiriva handihaler only. She has tried Trelegy in the past but had difficulty opening and was afraid she was wasting medication. She is currently using 2L oxygen at home during the day and at night. When she leaves her house she uses 3L on her portable cylinder. She does not have help at home. 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 Denies cough or mucus production, wheezing, chest tightness, chest pain.    07/18/2019 Patient contacted today for 1 month follow-up. During last visit Spiriva was changed to [employer]. No signs of acute COPD exacerbation. She is doing well today and feels her breathing is a  little better since starting stiolto. Using 4L oxygen during day; 2L at night. She is receiving physical therapy twice a week and nursing once a week.   She had her first covid vaccine last Friday 07/15/19 and second is scheduled for 08/05/19.  Observations/Objective:  - Able to speak in full sentences - No shortness of breath, wheezing or cough noted during phone conversation  Office testing: Spirometry 11/28/2014-moderate restriction of exhaled volume and moderate obstruction. FVC 1.75/69%, FEV1 1.26/66%, FEV1/FVC 0.72, FEF 25-75 percent 0.77/47%. Echo 09/17/16-EF 35%-40%, PA pressure 49, IVC dilated consistent with elevated CVP, A. fib PFT 09/17/16-severe obstructive airways disease without response to dilator, severe restriction, severe reduction of diffusion. FVC 1.10/44%, FEV1 0.80/43%, ratio 0.72, TLC 67%, DLCO 25% CT chest HR 09/04/15-. There are findings in the lungs which could indicate interstitial lung disease, such is mild nonspecific interstitial pneumonia  (NSIP).  Assessment and Plan:  COPD, mixed type: - Breathing has improved since starting LABA/LAMA - Spiriva changed to Stiolto in December 2020  - Continue Stiolto two puffs once daily (presciption sent to [facility]) - Rx flutter valve 3-4 times a day   - Working with physical therapy and nursing  - FU in 3-4 months   Chronic respiratory failure: - No increased oxygen demand - Continues using 4L oxygen during day; 2L at night  Follow Up Instructions:    - Regular office visit scheduled in May with Dr. Annamaria Boots   I discussed the assessment and treatment plan with the patient. The patient was provided an opportunity to ask questions and all were answered. The patient agreed with the plan and demonstrated an understanding of the  instructions.   The patient was advised to call back or seek an in-person evaluation if the symptoms worsen or if the condition fails to improve as anticipated.  I provided 18 minutes of  non-face-to-face time during this encounter.   Bonnie Ehrich, NP

## 2019-07-18 NOTE — Telephone Encounter (Signed)
spoke with nurse, Mendel Ryder [facility] Nurse) pt's HR is 44, she states that this is the first time that pt's HR has been this low BP is 132/80 and has been fine. She will continue to follow. Do you want to adjust medication? Pt is taking all medications as ordered per [facility]  Please call pt with any message/adjustment.

## 2019-07-19 DIAGNOSIS — I5030 Unspecified diastolic (congestive) heart failure: Secondary | ICD-10-CM | POA: Diagnosis not present

## 2019-07-19 DIAGNOSIS — I2510 Atherosclerotic heart disease of native coronary artery without angina pectoris: Secondary | ICD-10-CM | POA: Diagnosis not present

## 2019-07-19 DIAGNOSIS — R296 Repeated falls: Secondary | ICD-10-CM | POA: Diagnosis not present

## 2019-07-19 DIAGNOSIS — J439 Emphysema, unspecified: Secondary | ICD-10-CM | POA: Diagnosis not present

## 2019-07-19 DIAGNOSIS — J9611 Chronic respiratory failure with hypoxia: Secondary | ICD-10-CM | POA: Diagnosis not present

## 2019-07-19 DIAGNOSIS — I110 Hypertensive heart disease with heart failure: Secondary | ICD-10-CM | POA: Diagnosis not present

## 2019-07-20 DIAGNOSIS — C3490 Malignant neoplasm of unspecified part of unspecified bronchus or lung: Secondary | ICD-10-CM | POA: Diagnosis not present

## 2019-07-20 DIAGNOSIS — Z1331 Encounter for screening for depression: Secondary | ICD-10-CM | POA: Diagnosis not present

## 2019-07-20 DIAGNOSIS — J449 Chronic obstructive pulmonary disease, unspecified: Secondary | ICD-10-CM | POA: Diagnosis not present

## 2019-07-20 DIAGNOSIS — E039 Hypothyroidism, unspecified: Secondary | ICD-10-CM | POA: Diagnosis not present

## 2019-07-20 DIAGNOSIS — I10 Essential (primary) hypertension: Secondary | ICD-10-CM | POA: Diagnosis not present

## 2019-07-20 DIAGNOSIS — I509 Heart failure, unspecified: Secondary | ICD-10-CM | POA: Diagnosis not present

## 2019-07-20 DIAGNOSIS — E785 Hyperlipidemia, unspecified: Secondary | ICD-10-CM | POA: Diagnosis not present

## 2019-07-20 DIAGNOSIS — I480 Paroxysmal atrial fibrillation: Secondary | ICD-10-CM | POA: Diagnosis not present

## 2019-07-20 DIAGNOSIS — I714 Abdominal aortic aneurysm, without rupture: Secondary | ICD-10-CM | POA: Diagnosis not present

## 2019-07-20 NOTE — Progress Notes (Signed)
Cardiology Clinic Note   Patient Name: [name] Date of Encounter: 07/21/2019  Primary Care Provider:  Burnard Bunting, MD Primary Cardiologist:  Peter Martinique, MD  Patient Profile    [name]. [place] 79 year old female presents today for  bradycardia evaluation.  Past Medical History    Past Medical History:  Diagnosis Date  . AAA (abdominal aortic aneurysm) ([place])   . Anticoagulant long-term use    Failed on Coumadin. On Xarelto  . Anxiety   . Anxiety and depression   . Atrial fib/flutter, transient June 2012  . Chronic lower back pain   . COPD (chronic obstructive pulmonary disease) ([place])   . DVT (deep venous thrombosis) ([place]) 2004   BLE  . Esophageal dysmotility   . Exertional dyspnea   . Hiatal hernia   . History of bronchitis   . History of fibrocystic disease of breast   . History of uterine fibroid   . HTN (hypertension)   . Hyperlipidemia   . Hypothyroidism   . Normal nuclear stress test 2012   May 2012  . OA (osteoarthritis)    "knees; left shoulder"  . OSA (obstructive sleep apnea)    "haven't been using my CPAP lately" (01/21/12)  . Ovarian mass    right benign  . Ovarian mass    benign, right  . PAD (peripheral artery disease) ([place])   . Small cell carcinoma of lung ([place]) 2004   NON-SMALL CELL CARCINOMA OF THE LUNG, METASTATIC TO THE SUPRACLAVICULAR AND MEDIASTINAL LYMPH NODES; in remission   Past Surgical History:  Procedure Laterality Date  . ABDOMINAL AORTIC ANEURYSM REPAIR  ~ 2010   stent graft  . BLADDER SURGERY  ~ 2003   sling  . BREAST BIOPSY  1960's   both breast's - benign  . CARDIOVASCULAR STRESS TEST  2012   No ischemia  . CATARACT EXTRACTION W/ INTRAOCULAR LENS  IMPLANT, BILATERAL Bilateral ~ 2009  . REPLACEMENT TOTAL KNEE Left ~ 2008   left  . THYROIDECTOMY  ~ 1964    Allergies  No Known Allergies  History of Present Illness    [name] has a past medical history of atrial fibrillation on Xarelto, diastolic CHF,  COPD on home O2, anxiety, DVT, hypertension, hyperlipidemia, hypothyroidism, osteoarthritis, lung cancer, OSA, PVD, CHA2DS2-VASc score 7 (female, age x2, DVT x2, CHF, hypertension).  She was admitted to the hospital 326-09/28/2016 for A. fib RVR and CHF exacerbation.  She was not a candidate for anemia due to her lung disease, T consent and sotalol were also contraindicated due to her prolonged QT interval.  Multaq and flecainide are not good choices with congestive heart failure.  Choice was made to manage her symptoms with rate control and diuresis.  She was seen 05/2017 by Dr. Donnetta Hutching.  A CT showed stable saccular aneurysm.  She was also followed by Dr. Annamaria Boots for her chronic lung disease.  She was last seen by Dr. Martinique on 03/2018.  At that time she was doing well her breathing was stable.  She did experience shortness of breath with activity and she continued to use home oxygen.  Her weight was stable she denied any increased edema.  She continued to use metolazone sparingly as needed with good response.  However, she did not weigh regularly.  She was also having pain with her left shoulder and wanted to know if she would be an acceptable surgical candidate.  She presents the clinic today and states she called the office on  the 25th due to her in-home health nurse noticing her low heart rate and increased fatigue.  She was instructed to decrease her metoprolol tartrate to 25 mg twice daily.  She also states that she is now taking care of her daughter who has amyloid and is receiving cancer treatment as well as her granddaughter who is mentally  and physically disabled.  She feels she has been managing fairly well with increased help from home health nursing.  She has been seen pulmonary who have been managing her home oxygen needs.  She states she feels much better today.  She denies chest pain, increased shortness of breath, increased lower extremity edema, fatigue, increased palpitations, melena,  hematuria, hemoptysis, diaphoresis, weakness, presyncope, syncope, orthopnea, and PND.  Home Medications    Prior to Admission medications   Medication Sig Start Date End Date Taking? Authorizing Provider  albuterol (VENTOLIN HFA) 108 (90 Base) MCG/ACT inhaler TAKE 2 PUFFS EVERY 6 HOURS AS NEEDED FORSHORTNESS OF BREATH/WHEEZING 11/27/17   Baird Lyons D, MD  CALCIUM-MAGNESIUM-ZINC PO Take 1 tablet by mouth daily.     [provider]  Cholecalciferol (VITAMIN D) 1000 UNITS capsule Take 1,000 Units by mouth daily.      [provider]  citalopram (CELEXA) 20 MG tablet Take 20 mg by mouth daily.     [provider]  digoxin (LANOXIN) 0.125 MG tablet Take 1 tablet (125 mcg total) by mouth daily. NEED OV. 03/18/19   Martinique, Peter M, MD  furosemide (LASIX) 40 MG tablet Take 2 tablets (80 mg total) by mouth 2 (two) times daily. NEED OV. 05/24/19   Martinique, Peter M, MD  gabapentin (NEURONTIN) 100 MG capsule Take 1 capsule by mouth 3 (three) times daily.    [provider]  levothyroxine (SYNTHROID, LEVOTHROID) 88 MCG tablet Take 88 mcg by mouth daily.    [provider]  megestrol (MEGACE) 40 MG tablet Take 40 mg by mouth daily.     [provider]  metolazone (ZAROXOLYN) 2.5 MG tablet 1 DAILY FOR 3 DAYS AND THEN ONE AS NEEDED FOR WEIGHT GAIN 02/25/17   Martinique, Peter M, MD  metoprolol tartrate (LOPRESSOR) 50 MG tablet Take 50 mg by mouth as directed. Take 1/2 tablet twice a day    [provider]  morphine (MSIR) 15 MG tablet Take 1 tablet by mouth as needed. 10/13/16   [provider]  mupirocin ointment (BACTROBAN) 2 % mupirocin 2 % topical ointment  APPLY TO WOUNDS ON SKIN ONCE DAILY AT BANDAGE CHANGES    [provider]  OXYGEN Inhale 4 L into the lungs daily. Patient states she wears 2L at night and 4L when up and moving    [provider]  potassium chloride (KLOR-CON) 10 MEQ tablet PLEASE SEE ATTACHED FOR DETAILED  DIRECTIONS 06/13/19   Martinique, Peter M, MD  rivaroxaban (XARELTO) 20 MG TABS tablet TAKE ONE TABLET BY MOUTH DAILY WITH SUPPER. MUST SCHEDULE MD APPT FOR REFILLS 05/13/19   Martinique, Peter M, MD  temazepam (RESTORIL) 15 MG capsule TAKE 1 CAPSULE AT BEDTIME AS NEEDED  FOR  SLEEP 07/16/16   Baird Lyons D, MD  Tiotropium Bromide-Olodaterol (STIOLTO RESPIMAT) 2.5-2.5 MCG/ACT AERS Inhale 2 puffs into the lungs daily. 06/15/19   [name], NP  Tiotropium Bromide-Olodaterol 2.5-2.5 MCG/ACT AERS Inhale 2 puffs into the lungs daily. 07/18/19   [name], NP  vitamin B-12 (CYANOCOBALAMIN) 100 MCG tablet Take 100 mcg by mouth daily.  [provider]    Family History    Family History  Problem Relation Age of Onset  . Heart disease Father   . Heart attack Father   . Diabetes type II Mother   . Diabetes Mother   . Hypertension Mother   . Heart disease Brother        before age 47  . Heart disease Sister        See's Dr. Acie Fredrickson  . Breast cancer Neg Hx    She indicated that her mother is deceased. She indicated that her father is deceased. She indicated that only one of her four sisters is alive. She indicated that two of her three brothers are alive. She indicated that both of her daughters are alive. She indicated that the status of her neg hx is unknown.  Social History    Social History   Socioeconomic History  . Marital status: Divorced    Spouse name: Not on file  . Number of children: 2  . Years of education: Not on file  . Highest education level: Not on file  Occupational History  . Occupation: retired    Comment: Chiropractor  Tobacco Use  . Smoking status: Former Smoker    Packs/day: 1.00    Years: 40.00    Pack years: 40.00    Types: Cigarettes    Quit date: 06/23/2001    Years since quitting: 18.0  . Smokeless tobacco: Never Used  Substance and Sexual Activity  . Alcohol use: Yes    Alcohol/week: 0.0 standard drinks    Comment: once a week  (glass of wine)  . Drug use: No  . Sexual activity: Never  Other Topics Concern  . Not on file  Social History Narrative  . Not on file   Social Determinants of Health   Financial Resource Strain:   . Difficulty of Paying Living Expenses: Not on file  Food Insecurity:   . Worried About Charity fundraiser in the Last Year: Not on file  . Ran Out of Food in the Last Year: Not on file  Transportation Needs:   . Lack of Transportation (Medical): Not on file  . Lack of Transportation (Non-Medical): Not on file  Physical Activity:   . Days of Exercise per Week: Not on file  . Minutes of Exercise per Session: Not on file  Stress:   . Feeling of Stress : Not on file  Social Connections:   . Frequency of Communication with Friends and Family: Not on file  . Frequency of Social Gatherings with Friends and Family: Not on file  . Attends Religious Services: Not on file  . Active Member of Clubs or Organizations: Not on file  . Attends Archivist Meetings: Not on file  . Marital Status: Not on file  Intimate Partner Violence:   . Fear of Current or Ex-Partner: Not on file  . Emotionally Abused: Not on file  . Physically Abused: Not on file  . Sexually Abused: Not on file     Review of Systems    General:  No chills, fever, night sweats or weight changes.  Cardiovascular:  No chest pain, dyspnea on exertion, edema, orthopnea, palpitations, paroxysmal nocturnal dyspnea. Dermatological: No rash, lesions/masses Respiratory: No cough, dyspnea Urologic: No hematuria, dysuria Abdominal:   No nausea, vomiting, diarrhea, bright red blood per rectum, melena, or hematemesis Neurologic:  No visual changes, wkns, changes in mental status. All other systems reviewed and are otherwise negative  except as noted above.  Physical Exam    VS:  BP 122/77   Pulse 63   Ht 5\' 2"  (1.575 m)   Wt 222 lb 6.4 oz (100.9 kg)   SpO2 93%   BMI 40.68 kg/m  , BMI Body mass index is 40.68  kg/m. GEN: Well nourished, well developed, in no acute distress. HEENT: normal. Neck: Supple, no JVD, carotid bruits, or masses. Cardiac: RRR, no murmurs, rubs, or gallops. No clubbing, cyanosis, bilateral  lower extremity +1 pitting edema.  Radials/DP/PT 2+ and equal bilaterally.  Respiratory:  Respirations regular and unlabored, clear to auscultation bilaterally. GI: Soft, nontender, nondistended, BS + x 4. MS: no deformity or atrophy. Skin: warm and dry, no rash. Neuro:  Strength and sensation are intact. Psych: Normal affect.  Accessory Clinical Findings    ECG personally reviewed by me today-sinus rhythm with marked sinus arrhythmia and premature atrial complexes left ventricular hypertrophy with repolarization abnormality 63 bpm- No acute changes  EKG 08/31/2017 Sinus bradycardia first-degree AV block 53 bpm  Echocardiogram 09/17/2016 Study Conclusions  - Left ventricle: The cavity size was normal. Wall thickness was   increased in a pattern of moderate LVH. Systolic function was   moderately reduced. The estimated ejection fraction was in the   range of 35% to 40%. Diffuse hypokinesis. The study is not   technically sufficient to allow evaluation of LV diastolic   function. - Aortic valve: Trileaflet. Sclerosis without stenosis. There was   no regurgitation. - Mitral valve: Calcified annulus. Mildly thickened leaflets . - Left atrium: The atrium was mildly dilated. - Right ventricle: The cavity size was normal. Mildly decreased RV   systolic function. - Right atrium: Moderately dilated. - Tricuspid valve: There was moderate regurgitation. - Pulmonary arteries: PA peak pressure: 49 mm Hg (S). - Inferior vena cava: The vessel was dilated. The respirophasic   diameter changes were blunted (< 50%), consistent with elevated   central venous pressure.   Assessment & Plan   1.  Bradycardia-heart rate today 63 bpm.  EKG shows sinus rhythm with marked sinus arrhythmia and  premature atrial complexes left ventricular hypertrophy with repolarization abnormality. Order digoxin level Order BMP    Chronic combined systolic/diastolic heart failure-+1 pitting bilateral lower extremity edema today.  No increase shortness of breath.  Edema dissipates each night and reaccumulates through the day. Continue metoprolol tartrate 25 mg twice daily Continue furosemide 80 mg twice daily Continue metolazone 2.5 as needed Heart healthy low-sodium diet-salty 6 given Continue fluid restriction Increase physical activity as tolerated  COPD-continues to use home O2 4 L liters through the day and 2 L at night.  Stable. Monitored by pulmonary  Persistent atrial fibrillation-EKG shows sinus rhythm with marked sinus arrhythmia and premature atrial complexes left ventricular hypertrophy with repolarization abnormality 63 bpm Continue Xarelto Continue metoprolol tartrate 25 mg twice daily  Hypercholesterolemia-LDL 130 01/04/2019 Heart healthy low-sodium high-fiber diet Increase physical activity as tolerated Monitored by PCP  Disposition: Follow-up with me or Dr. Martinique in 1 month.  Jossie Ng. Tongela Encinas NP-C    01/25/2019 11:58 AM    [address] Office [phone] Fax [phone]

## 2019-07-21 ENCOUNTER — Encounter: Payer: Self-pay | Admitting: General Practice

## 2019-07-21 ENCOUNTER — Ambulatory Visit (INDEPENDENT_AMBULATORY_CARE_PROVIDER_SITE_OTHER): Payer: Medicare Other | Admitting: General Practice

## 2019-07-21 ENCOUNTER — Other Ambulatory Visit: Payer: Self-pay | Admitting: Cardiology

## 2019-07-21 ENCOUNTER — Other Ambulatory Visit: Payer: Self-pay

## 2019-07-21 VITALS — BP 122/77 | HR 63 | Ht 62.0 in | Wt 222.4 lb

## 2019-07-21 DIAGNOSIS — R296 Repeated falls: Secondary | ICD-10-CM | POA: Diagnosis not present

## 2019-07-21 DIAGNOSIS — J9611 Chronic respiratory failure with hypoxia: Secondary | ICD-10-CM

## 2019-07-21 DIAGNOSIS — I2510 Atherosclerotic heart disease of native coronary artery without angina pectoris: Secondary | ICD-10-CM | POA: Diagnosis not present

## 2019-07-21 DIAGNOSIS — Z79899 Other long term (current) drug therapy: Secondary | ICD-10-CM

## 2019-07-21 DIAGNOSIS — J439 Emphysema, unspecified: Secondary | ICD-10-CM | POA: Diagnosis not present

## 2019-07-21 DIAGNOSIS — I110 Hypertensive heart disease with heart failure: Secondary | ICD-10-CM | POA: Diagnosis not present

## 2019-07-21 DIAGNOSIS — E7800 Pure hypercholesterolemia, unspecified: Secondary | ICD-10-CM

## 2019-07-21 DIAGNOSIS — I5030 Unspecified diastolic (congestive) heart failure: Secondary | ICD-10-CM | POA: Diagnosis not present

## 2019-07-21 DIAGNOSIS — I4819 Other persistent atrial fibrillation: Secondary | ICD-10-CM | POA: Diagnosis not present

## 2019-07-21 DIAGNOSIS — I5042 Chronic combined systolic (congestive) and diastolic (congestive) heart failure: Secondary | ICD-10-CM

## 2019-07-21 MED ORDER — DIGOXIN 125 MCG PO TABS: 125.0000 ug | ORAL_TABLET | Freq: Every day | ORAL | 1 refills | Status: DC

## 2019-07-21 MED ORDER — METOPROLOL TARTRATE 25 MG PO TABS: 25.0000 mg | ORAL_TABLET | Freq: Two times a day (BID) | ORAL | 1 refills | Status: DC

## 2019-07-21 MED ORDER — POTASSIUM CHLORIDE ER 10 MEQ PO TBCR: 10.0000 meq | EXTENDED_RELEASE_TABLET | Freq: Two times a day (BID) | ORAL | 1 refills | Status: DC

## 2019-07-21 NOTE — Telephone Encounter (Signed)
PT HAS APPT TODAY WILL REFILL THEN

## 2019-07-21 NOTE — Patient Instructions (Signed)
Labwork: DIG LEVEL AND BMET HERE IN OUR OFFICE AT [facility]   You will need to fast. DO NOT EAT OR DRINK PAST MIDNIGHT.     You will NOT need to fast   If you have labs (blood work) drawn today and your tests are completely normal, you will receive your results only by: Marland Kitchen MyChart Message (if you have MyChart) OR . A paper copy in the mail If you have any lab test that is abnormal or we need to change your treatment, we will call you to review the results. Special Instructions: PLEASE READ AND FOLLOW SALTY 6 ATTACHED  Reduce your risk of getting COVID-19 With your heart disease it is especially important for people at increased risk of severe illness from COVID-19, and those who live with them, to protect themselves from getting COVID-19. The best way to protect yourself and to help reduce the spread of the virus that causes COVID-19 is to: Marland Kitchen Limit your interactions with other people as much as possible. . Take precautions to prevent getting COVID-19 when you do interact with others. If you start feeling sick and think you may have COVID-19, get in touch with your healthcare provider within 24 hours.  Follow-Up: 1 MONTH WITH JESSE CLEAVER,FNP OR DR Martinique  You may see Peter Martinique, MD or one of the following Advanced Practice Providers on your designated Care Team:  Almyra Deforest, PA-C  Fabian Sharp, PA-C or Roby Lofts, [place].    At [facility], you and your health needs are our priority.  As part of our continuing mission to provide you with exceptional heart care, we have created designated Provider Care Teams.  These Care Teams include your primary Cardiologist (physician) and Advanced Practice Providers (APPs -  Physician Assistants and Nurse Practitioners) who all work together to provide you with the care you need, when you need it.  Thank you for choosing [facility] at [facility]!!

## 2019-07-22 DIAGNOSIS — I110 Hypertensive heart disease with heart failure: Secondary | ICD-10-CM | POA: Diagnosis not present

## 2019-07-22 DIAGNOSIS — I2510 Atherosclerotic heart disease of native coronary artery without angina pectoris: Secondary | ICD-10-CM | POA: Diagnosis not present

## 2019-07-22 DIAGNOSIS — J9611 Chronic respiratory failure with hypoxia: Secondary | ICD-10-CM | POA: Diagnosis not present

## 2019-07-22 DIAGNOSIS — J439 Emphysema, unspecified: Secondary | ICD-10-CM | POA: Diagnosis not present

## 2019-07-22 DIAGNOSIS — I5030 Unspecified diastolic (congestive) heart failure: Secondary | ICD-10-CM | POA: Diagnosis not present

## 2019-07-22 DIAGNOSIS — R296 Repeated falls: Secondary | ICD-10-CM | POA: Diagnosis not present

## 2019-07-22 LAB — BASIC METABOLIC PANEL
BUN/Creatinine Ratio: 13 (ref 12–28)
BUN: 13 mg/dL (ref 8–27)
CO2: 27 mmol/L (ref 20–29)
Calcium: 9.7 mg/dL (ref 8.7–10.3)
Chloride: 101 mmol/L (ref 96–106)
Creatinine, Ser: 1 mg/dL (ref 0.57–1.00)
GFR calc Af Amer: 61 mL/min/{1.73_m2} (ref >59)
GFR calc non Af Amer: 53 mL/min/{1.73_m2} — ABNORMAL LOW (ref >59)
Glucose: 76 mg/dL (ref 65–99)
Potassium: 4.3 mmol/L (ref 3.5–5.2)
Sodium: 140 mmol/L (ref 134–144)

## 2019-07-22 LAB — DIGOXIN LEVEL: Digoxin, Serum: 0.6 ng/mL (ref 0.5–0.9)

## 2019-07-25 DIAGNOSIS — I110 Hypertensive heart disease with heart failure: Secondary | ICD-10-CM | POA: Diagnosis not present

## 2019-07-25 DIAGNOSIS — J439 Emphysema, unspecified: Secondary | ICD-10-CM | POA: Diagnosis not present

## 2019-07-25 DIAGNOSIS — R296 Repeated falls: Secondary | ICD-10-CM | POA: Diagnosis not present

## 2019-07-25 DIAGNOSIS — I2510 Atherosclerotic heart disease of native coronary artery without angina pectoris: Secondary | ICD-10-CM | POA: Diagnosis not present

## 2019-07-25 DIAGNOSIS — I5030 Unspecified diastolic (congestive) heart failure: Secondary | ICD-10-CM | POA: Diagnosis not present

## 2019-07-25 DIAGNOSIS — J9611 Chronic respiratory failure with hypoxia: Secondary | ICD-10-CM | POA: Diagnosis not present

## 2019-07-27 ENCOUNTER — Ambulatory Visit: Payer: Medicare Other | Admitting: Podiatry

## 2019-07-27 DIAGNOSIS — Z9981 Dependence on supplemental oxygen: Secondary | ICD-10-CM | POA: Diagnosis not present

## 2019-07-27 DIAGNOSIS — M170 Bilateral primary osteoarthritis of knee: Secondary | ICD-10-CM | POA: Diagnosis not present

## 2019-07-27 DIAGNOSIS — M19012 Primary osteoarthritis, left shoulder: Secondary | ICD-10-CM | POA: Diagnosis not present

## 2019-07-27 DIAGNOSIS — Z7901 Long term (current) use of anticoagulants: Secondary | ICD-10-CM | POA: Diagnosis not present

## 2019-07-27 DIAGNOSIS — Z9181 History of falling: Secondary | ICD-10-CM | POA: Diagnosis not present

## 2019-07-27 DIAGNOSIS — I110 Hypertensive heart disease with heart failure: Secondary | ICD-10-CM | POA: Diagnosis not present

## 2019-07-27 DIAGNOSIS — Z79818 Long term (current) use of other agents affecting estrogen receptors and estrogen levels: Secondary | ICD-10-CM | POA: Diagnosis not present

## 2019-07-27 DIAGNOSIS — E785 Hyperlipidemia, unspecified: Secondary | ICD-10-CM | POA: Diagnosis not present

## 2019-07-27 DIAGNOSIS — I4891 Unspecified atrial fibrillation: Secondary | ICD-10-CM | POA: Diagnosis not present

## 2019-07-27 DIAGNOSIS — F329 Major depressive disorder, single episode, unspecified: Secondary | ICD-10-CM | POA: Diagnosis not present

## 2019-07-27 DIAGNOSIS — J9611 Chronic respiratory failure with hypoxia: Secondary | ICD-10-CM | POA: Diagnosis not present

## 2019-07-27 DIAGNOSIS — I4892 Unspecified atrial flutter: Secondary | ICD-10-CM | POA: Diagnosis not present

## 2019-07-27 DIAGNOSIS — E039 Hypothyroidism, unspecified: Secondary | ICD-10-CM | POA: Diagnosis not present

## 2019-07-27 DIAGNOSIS — I5030 Unspecified diastolic (congestive) heart failure: Secondary | ICD-10-CM | POA: Diagnosis not present

## 2019-07-27 DIAGNOSIS — J449 Chronic obstructive pulmonary disease, unspecified: Secondary | ICD-10-CM | POA: Diagnosis not present

## 2019-07-27 DIAGNOSIS — G4733 Obstructive sleep apnea (adult) (pediatric): Secondary | ICD-10-CM | POA: Diagnosis not present

## 2019-07-27 DIAGNOSIS — K449 Diaphragmatic hernia without obstruction or gangrene: Secondary | ICD-10-CM | POA: Diagnosis not present

## 2019-07-27 DIAGNOSIS — R296 Repeated falls: Secondary | ICD-10-CM | POA: Diagnosis not present

## 2019-07-27 DIAGNOSIS — I2510 Atherosclerotic heart disease of native coronary artery without angina pectoris: Secondary | ICD-10-CM | POA: Diagnosis not present

## 2019-07-27 DIAGNOSIS — G8929 Other chronic pain: Secondary | ICD-10-CM | POA: Diagnosis not present

## 2019-07-27 DIAGNOSIS — Z79891 Long term (current) use of opiate analgesic: Secondary | ICD-10-CM | POA: Diagnosis not present

## 2019-07-27 DIAGNOSIS — J439 Emphysema, unspecified: Secondary | ICD-10-CM | POA: Diagnosis not present

## 2019-07-27 DIAGNOSIS — I714 Abdominal aortic aneurysm, without rupture: Secondary | ICD-10-CM | POA: Diagnosis not present

## 2019-07-27 DIAGNOSIS — I739 Peripheral vascular disease, unspecified: Secondary | ICD-10-CM | POA: Diagnosis not present

## 2019-07-27 DIAGNOSIS — F419 Anxiety disorder, unspecified: Secondary | ICD-10-CM | POA: Diagnosis not present

## 2019-07-27 DIAGNOSIS — M545 Low back pain: Secondary | ICD-10-CM | POA: Diagnosis not present

## 2019-07-28 ENCOUNTER — Telehealth: Payer: Self-pay | Admitting: Cardiology

## 2019-07-28 DIAGNOSIS — J9611 Chronic respiratory failure with hypoxia: Secondary | ICD-10-CM | POA: Diagnosis not present

## 2019-07-28 DIAGNOSIS — J439 Emphysema, unspecified: Secondary | ICD-10-CM | POA: Diagnosis not present

## 2019-07-28 DIAGNOSIS — I110 Hypertensive heart disease with heart failure: Secondary | ICD-10-CM | POA: Diagnosis not present

## 2019-07-28 DIAGNOSIS — I2510 Atherosclerotic heart disease of native coronary artery without angina pectoris: Secondary | ICD-10-CM | POA: Diagnosis not present

## 2019-07-28 DIAGNOSIS — I5030 Unspecified diastolic (congestive) heart failure: Secondary | ICD-10-CM | POA: Diagnosis not present

## 2019-07-28 DIAGNOSIS — R296 Repeated falls: Secondary | ICD-10-CM | POA: Diagnosis not present

## 2019-07-28 MED ORDER — METOPROLOL TARTRATE 25 MG PO TABS: ORAL_TABLET | ORAL | 3 refills | Status: DC

## 2019-07-28 NOTE — Telephone Encounter (Signed)
Spoke to patient Dr.Jordan advised to decrease Metoprolol Tart to 25 mg 1/2 tablet twice a day.Advised continue to monitor and call back if heart rate continues to be low. Spoke to [employer] with [facility] Dr.Jordan's recommendation given.

## 2019-07-28 NOTE — Telephone Encounter (Signed)
Reduce metoprolol to 12.5 mg bid and monitor  Bonnie Balazs Martinique MD, [facility]

## 2019-07-28 NOTE — Telephone Encounter (Signed)
New Message    Mendel Ryder is calling from [facility] to report a HR reading  HR 51  Currently taking 25 mg Metoprolol  2 x daily   Along with her digoxin (LANOXIN) 0.125 MG tablet     Please call

## 2019-07-28 NOTE — Telephone Encounter (Signed)
Spoke with Mendel Ryder from [facility] who report even with decreased Metoprolol dose of 25 mg BID, pt still remains bradycardic. She report pt's HR today was 50-51. She report pt continues to c/o of feeling tired and SOB.  Will route to MD

## 2019-07-29 NOTE — Addendum Note (Signed)
Addended by: Hinton Dyer on: 07/29/2019 06:59 AM   Modules accepted: Orders

## 2019-08-01 DIAGNOSIS — J439 Emphysema, unspecified: Secondary | ICD-10-CM | POA: Diagnosis not present

## 2019-08-01 DIAGNOSIS — R296 Repeated falls: Secondary | ICD-10-CM | POA: Diagnosis not present

## 2019-08-01 DIAGNOSIS — I5030 Unspecified diastolic (congestive) heart failure: Secondary | ICD-10-CM | POA: Diagnosis not present

## 2019-08-01 DIAGNOSIS — I110 Hypertensive heart disease with heart failure: Secondary | ICD-10-CM | POA: Diagnosis not present

## 2019-08-01 DIAGNOSIS — I2510 Atherosclerotic heart disease of native coronary artery without angina pectoris: Secondary | ICD-10-CM | POA: Diagnosis not present

## 2019-08-01 DIAGNOSIS — J9611 Chronic respiratory failure with hypoxia: Secondary | ICD-10-CM | POA: Diagnosis not present

## 2019-08-02 ENCOUNTER — Telehealth: Payer: Self-pay | Admitting: Internal Medicine

## 2019-08-02 DIAGNOSIS — J439 Emphysema, unspecified: Secondary | ICD-10-CM | POA: Diagnosis not present

## 2019-08-02 DIAGNOSIS — J9611 Chronic respiratory failure with hypoxia: Secondary | ICD-10-CM | POA: Diagnosis not present

## 2019-08-02 DIAGNOSIS — I110 Hypertensive heart disease with heart failure: Secondary | ICD-10-CM | POA: Diagnosis not present

## 2019-08-02 DIAGNOSIS — R296 Repeated falls: Secondary | ICD-10-CM | POA: Diagnosis not present

## 2019-08-02 DIAGNOSIS — I2510 Atherosclerotic heart disease of native coronary artery without angina pectoris: Secondary | ICD-10-CM | POA: Diagnosis not present

## 2019-08-02 DIAGNOSIS — I5030 Unspecified diastolic (congestive) heart failure: Secondary | ICD-10-CM | POA: Diagnosis not present

## 2019-08-02 NOTE — Telephone Encounter (Signed)
[place] for PT order as requested, thanks

## 2019-08-02 NOTE — Telephone Encounter (Signed)
Spoke with the Stacy with [place]  She states needing VO for another PT visit and also flutter valve  I advised we already ordered flutter valve- order confirmed with [facility]- should be able to go and pick up  Now just needing order for the PT  Please advise if okay thanks

## 2019-08-02 NOTE — Telephone Encounter (Signed)
LMTCB x 1 

## 2019-08-02 NOTE — Telephone Encounter (Signed)
Erline Levine calling to get approval for [phone] and also want to get a flutter valve to help clear her airway and to help to break up the congestion. Erline Levine can be reached at [phone]

## 2019-08-02 NOTE — Telephone Encounter (Signed)
Spoke with Erline Levine and notified ok for PT visit per [facility]  Nothing further needed

## 2019-08-04 DIAGNOSIS — I110 Hypertensive heart disease with heart failure: Secondary | ICD-10-CM | POA: Diagnosis not present

## 2019-08-04 DIAGNOSIS — I2510 Atherosclerotic heart disease of native coronary artery without angina pectoris: Secondary | ICD-10-CM | POA: Diagnosis not present

## 2019-08-04 DIAGNOSIS — J9611 Chronic respiratory failure with hypoxia: Secondary | ICD-10-CM | POA: Diagnosis not present

## 2019-08-04 DIAGNOSIS — I5030 Unspecified diastolic (congestive) heart failure: Secondary | ICD-10-CM | POA: Diagnosis not present

## 2019-08-04 DIAGNOSIS — R296 Repeated falls: Secondary | ICD-10-CM | POA: Diagnosis not present

## 2019-08-04 DIAGNOSIS — J439 Emphysema, unspecified: Secondary | ICD-10-CM | POA: Diagnosis not present

## 2019-08-08 DIAGNOSIS — R040 Epistaxis: Secondary | ICD-10-CM | POA: Diagnosis not present

## 2019-08-08 DIAGNOSIS — J310 Chronic rhinitis: Secondary | ICD-10-CM | POA: Diagnosis not present

## 2019-08-09 DIAGNOSIS — I110 Hypertensive heart disease with heart failure: Secondary | ICD-10-CM | POA: Diagnosis not present

## 2019-08-09 DIAGNOSIS — I2510 Atherosclerotic heart disease of native coronary artery without angina pectoris: Secondary | ICD-10-CM | POA: Diagnosis not present

## 2019-08-09 DIAGNOSIS — J439 Emphysema, unspecified: Secondary | ICD-10-CM | POA: Diagnosis not present

## 2019-08-09 DIAGNOSIS — J9611 Chronic respiratory failure with hypoxia: Secondary | ICD-10-CM | POA: Diagnosis not present

## 2019-08-09 DIAGNOSIS — R296 Repeated falls: Secondary | ICD-10-CM | POA: Diagnosis not present

## 2019-08-09 DIAGNOSIS — I5030 Unspecified diastolic (congestive) heart failure: Secondary | ICD-10-CM | POA: Diagnosis not present

## 2019-08-10 DIAGNOSIS — I110 Hypertensive heart disease with heart failure: Secondary | ICD-10-CM | POA: Diagnosis not present

## 2019-08-10 DIAGNOSIS — I5030 Unspecified diastolic (congestive) heart failure: Secondary | ICD-10-CM | POA: Diagnosis not present

## 2019-08-10 DIAGNOSIS — J9611 Chronic respiratory failure with hypoxia: Secondary | ICD-10-CM | POA: Diagnosis not present

## 2019-08-10 DIAGNOSIS — R296 Repeated falls: Secondary | ICD-10-CM | POA: Diagnosis not present

## 2019-08-10 DIAGNOSIS — J439 Emphysema, unspecified: Secondary | ICD-10-CM | POA: Diagnosis not present

## 2019-08-10 DIAGNOSIS — I2510 Atherosclerotic heart disease of native coronary artery without angina pectoris: Secondary | ICD-10-CM | POA: Diagnosis not present

## 2019-08-19 DIAGNOSIS — J439 Emphysema, unspecified: Secondary | ICD-10-CM | POA: Diagnosis not present

## 2019-08-19 DIAGNOSIS — I2510 Atherosclerotic heart disease of native coronary artery without angina pectoris: Secondary | ICD-10-CM | POA: Diagnosis not present

## 2019-08-19 DIAGNOSIS — I110 Hypertensive heart disease with heart failure: Secondary | ICD-10-CM | POA: Diagnosis not present

## 2019-08-19 DIAGNOSIS — R296 Repeated falls: Secondary | ICD-10-CM | POA: Diagnosis not present

## 2019-08-19 DIAGNOSIS — J9611 Chronic respiratory failure with hypoxia: Secondary | ICD-10-CM | POA: Diagnosis not present

## 2019-08-19 DIAGNOSIS — I5030 Unspecified diastolic (congestive) heart failure: Secondary | ICD-10-CM | POA: Diagnosis not present

## 2019-08-22 ENCOUNTER — Telehealth: Payer: Self-pay | Admitting: Internal Medicine

## 2019-08-22 DIAGNOSIS — R296 Repeated falls: Secondary | ICD-10-CM | POA: Diagnosis not present

## 2019-08-22 DIAGNOSIS — J310 Chronic rhinitis: Secondary | ICD-10-CM | POA: Diagnosis not present

## 2019-08-22 DIAGNOSIS — J439 Emphysema, unspecified: Secondary | ICD-10-CM | POA: Diagnosis not present

## 2019-08-22 DIAGNOSIS — R040 Epistaxis: Secondary | ICD-10-CM | POA: Diagnosis not present

## 2019-08-22 DIAGNOSIS — I5030 Unspecified diastolic (congestive) heart failure: Secondary | ICD-10-CM | POA: Diagnosis not present

## 2019-08-22 DIAGNOSIS — I2510 Atherosclerotic heart disease of native coronary artery without angina pectoris: Secondary | ICD-10-CM | POA: Diagnosis not present

## 2019-08-22 DIAGNOSIS — J9611 Chronic respiratory failure with hypoxia: Secondary | ICD-10-CM | POA: Diagnosis not present

## 2019-08-22 DIAGNOSIS — I110 Hypertensive heart disease with heart failure: Secondary | ICD-10-CM | POA: Diagnosis not present

## 2019-08-22 NOTE — Telephone Encounter (Signed)
Yes, thank you.

## 2019-08-22 NOTE — Telephone Encounter (Signed)
Can we extend PT order for twice a week for 4 weeks and once a week for two weeks? Please advise.

## 2019-08-22 NOTE — Telephone Encounter (Signed)
Spoke with [name] and notified of response per [facility] and she verbalized understanding. Nothing further needed.

## 2019-08-22 NOTE — Progress Notes (Signed)
Cardiology Clinic Note   Patient Name: [name] Date of Encounter: 08/23/2019  Primary Care Provider:  Burnard Bunting, MD Primary Cardiologist:  Peter Martinique, MD  Patient Profile    [name]. Ost 79 year old female presents today for  bradycardia evaluation.  Past Medical History    Past Medical History:  Diagnosis Date  . AAA (abdominal aortic aneurysm) ([place])   . Anticoagulant long-term use    Failed on Coumadin. On Xarelto  . Anxiety   . Anxiety and depression   . Atrial fib/flutter, transient June 2012  . Chronic lower back pain   . COPD (chronic obstructive pulmonary disease) ([place])   . DVT (deep venous thrombosis) ([place]) 2004   BLE  . Esophageal dysmotility   . Exertional dyspnea   . Hiatal hernia   . History of bronchitis   . History of fibrocystic disease of breast   . History of uterine fibroid   . HTN (hypertension)   . Hyperlipidemia   . Hypothyroidism   . Normal nuclear stress test 2012   May 2012  . OA (osteoarthritis)    "knees; left shoulder"  . OSA (obstructive sleep apnea)    "haven't been using my CPAP lately" (01/21/12)  . Ovarian mass    right benign  . Ovarian mass    benign, right  . PAD (peripheral artery disease) ([place])   . Small cell carcinoma of lung ([place]) 2004   NON-SMALL CELL CARCINOMA OF THE LUNG, METASTATIC TO THE SUPRACLAVICULAR AND MEDIASTINAL LYMPH NODES; in remission   Past Surgical History:  Procedure Laterality Date  . ABDOMINAL AORTIC ANEURYSM REPAIR  ~ 2010   stent graft  . BLADDER SURGERY  ~ 2003   sling  . BREAST BIOPSY  1960's   both breast's - benign  . CARDIOVASCULAR STRESS TEST  2012   No ischemia  . CATARACT EXTRACTION W/ INTRAOCULAR LENS  IMPLANT, BILATERAL Bilateral ~ 2009  . REPLACEMENT TOTAL KNEE Left ~ 2008   left  . THYROIDECTOMY  ~ 1964    Allergies  No Known Allergies  History of Present Illness    [name] has a past medical history of atrial fibrillation on Xarelto, diastolic CHF,  COPD on home O2, anxiety, DVT, hypertension, hyperlipidemia, hypothyroidism, osteoarthritis, lung cancer, OSA, PVD, CHA2DS2-VASc score 7 (female, age x2, DVT x2, CHF, hypertension).  She was admitted to the hospital 3/26-09/28/2016 for A. fib RVR and CHF exacerbation.  She was not a candidate for anemia due to her lung disease, T consent and sotalol were also contraindicated due to her prolonged QT interval.  Multaq and flecainide are not good choices with congestive heart failure.  Choice was made to manage her symptoms with rate control and diuresis.  She was seen 05/2017 by Dr. Donnetta Hutching.  A CT showed stable saccular aneurysm.  She was also followed by Dr. Annamaria Boots for her chronic lung disease.  She was last seen by Dr. Martinique on 03/2018.  At that time she was doing well her breathing was stable.  She did experience shortness of breath with activity and she continued to use home oxygen.  Her weight was stable she denied any increased edema.  She continued to use metolazone sparingly as needed with good response.  However, she did not weigh regularly.  She was also having pain with her left shoulder and wanted to know if she would be an acceptable surgical candidate.  She presented the clinic 07/21/19 and stated she called the office on  the 25th due to her in-home health nurse noticing her low heart rate and increased fatigue.  She was instructed to decrease her metoprolol tartrate to 25 mg twice daily.  She also stated that she was taking care of her [name] who has amyloid and is receiving cancer treatment as well as her [name] who is mentally  and physically disabled.  She felt she had been managing fairly well with increased help from home health nursing.  She had been seeing pulmonary who were managing her home oxygen needs.    She presents the clinic today for follow-up and states she has been doing well.  [name] was recently visiting from [place] and helping her with her other [name].  Her  [name] that has amyloid has been responding to treatment well.  She states that she has started to climb the stairs in her house and that her weight has remained stable.  She continues to use 4 L nasal cannula through the day and 2 L at night.  This is managed by pulmonary.  She is still receiving assistance from home health.  She states she has not been taking/meeting her metolazone medication.  I will refill her prescription today and have her follow-up with Dr. Martinique in 3 months.  She denies chest pain, increased shortness of breath, increased lower extremity edema, fatigue, increased palpitations, melena, hematuria, hemoptysis, diaphoresis, weakness, presyncope, syncope, orthopnea, and PND.  Home Medications    Prior to Admission medications   Medication Sig Start Date End Date Taking? Authorizing Provider  albuterol (VENTOLIN HFA) 108 (90 Base) MCG/ACT inhaler TAKE 2 PUFFS EVERY 6 HOURS AS NEEDED FORSHORTNESS OF BREATH/WHEEZING 11/27/17   Baird Lyons D, MD  CALCIUM-MAGNESIUM-ZINC PO Take 1 tablet by mouth daily.     [provider]  Cholecalciferol (VITAMIN D) 1000 UNITS capsule Take 1,000 Units by mouth daily.      [provider]  citalopram (CELEXA) 20 MG tablet Take 20 mg by mouth daily.     [provider]  digoxin (LANOXIN) 0.125 MG tablet Take 1 tablet (125 mcg total) by mouth daily. 07/21/19   Deberah Pelton, NP  furosemide (LASIX) 40 MG tablet Take 2 tablets (80 mg total) by mouth 2 (two) times daily. NEED OV. 05/24/19   Martinique, Peter M, MD  gabapentin (NEURONTIN) 100 MG capsule Take 1 capsule by mouth 3 (three) times daily.    [provider]  levothyroxine (SYNTHROID, LEVOTHROID) 88 MCG tablet Take 88 mcg by mouth daily.    [provider]  megestrol (MEGACE) 40 MG tablet Take 40 mg by mouth daily.     [provider]  metolazone (ZAROXOLYN) 2.5 MG tablet 1 DAILY FOR 3 DAYS AND THEN ONE AS NEEDED FOR WEIGHT GAIN 02/25/17    Martinique, Peter M, MD  metoprolol tartrate (LOPRESSOR) 25 MG tablet Take 1/2 tablet ( 12.5 mg ) twice a day. 07/28/19   Martinique, Peter M, MD  morphine (MSIR) 15 MG tablet Take 1 tablet by mouth as needed. 10/13/16   [provider]  mupirocin ointment (BACTROBAN) 2 % mupirocin 2 % topical ointment  APPLY TO WOUNDS ON SKIN ONCE DAILY AT BANDAGE CHANGES    [provider]  OXYGEN Inhale 4 L into the lungs daily. Patient states she wears 2L at night and 4L when up and moving    [provider]  potassium chloride (KLOR-CON) 10 MEQ tablet Take 1 tablet (10 mEq total) by mouth 2 (two) times daily. 07/21/19  Deberah Pelton, NP  rivaroxaban (XARELTO) 20 MG TABS tablet TAKE ONE TABLET BY MOUTH DAILY WITH SUPPER. MUST SCHEDULE MD APPT FOR REFILLS 05/13/19   Martinique, Peter M, MD  temazepam (RESTORIL) 15 MG capsule TAKE 1 CAPSULE AT BEDTIME AS NEEDED  FOR  SLEEP 07/16/16   Baird Lyons D, MD  Tiotropium Bromide-Olodaterol 2.5-2.5 MCG/ACT AERS Inhale 2 puffs into the lungs daily. 07/18/19   [name], NP  vitamin B-12 (CYANOCOBALAMIN) 100 MCG tablet Take 100 mcg by mouth daily.    [provider]    Family History    Family History  Problem Relation Age of Onset  . Heart disease Father   . Heart attack Father   . Diabetes type II Mother   . Diabetes Mother   . Hypertension Mother   . Heart disease Brother        before age 50  . Heart disease Sister        See's Dr. Acie Fredrickson  . Breast cancer Neg Hx    She indicated that her mother is deceased. She indicated that her father is deceased. She indicated that only one of her four sisters is alive. She indicated that two of her three brothers are alive. She indicated that both of her daughters are alive. She indicated that the status of her neg hx is unknown.  Social History    Social History   Socioeconomic History  . Marital status: Divorced    Spouse name: Not on file  . Number of children: 2  . Years of  education: Not on file  . Highest education level: Not on file  Occupational History  . Occupation: retired    Comment: Chiropractor  Tobacco Use  . Smoking status: Former Smoker    Packs/day: 1.00    Years: 40.00    Pack years: 40.00    Types: Cigarettes    Quit date: 06/23/2001    Years since quitting: 18.1  . Smokeless tobacco: Never Used  Substance and Sexual Activity  . Alcohol use: Yes    Alcohol/week: 0.0 standard drinks    Comment: once a week (glass of wine)  . Drug use: No  . Sexual activity: Never  Other Topics Concern  . Not on file  Social History Narrative  . Not on file   Social Determinants of Health   Financial Resource Strain:   . Difficulty of Paying Living Expenses: Not on file  Food Insecurity:   . Worried About Charity fundraiser in the Last Year: Not on file  . Ran Out of Food in the Last Year: Not on file  Transportation Needs:   . Lack of Transportation (Medical): Not on file  . Lack of Transportation (Non-Medical): Not on file  Physical Activity:   . Days of Exercise per Week: Not on file  . Minutes of Exercise per Session: Not on file  Stress:   . Feeling of Stress : Not on file  Social Connections:   . Frequency of Communication with Friends and Family: Not on file  . Frequency of Social Gatherings with Friends and Family: Not on file  . Attends Religious Services: Not on file  . Active Member of Clubs or Organizations: Not on file  . Attends Archivist Meetings: Not on file  . Marital Status: Not on file  Intimate Partner Violence:   . Fear of Current or Ex-Partner: Not on file  . Emotionally Abused: Not on file  .  Physically Abused: Not on file  . Sexually Abused: Not on file     Review of Systems    General:  No chills, fever, night sweats or weight changes.  Cardiovascular:  No chest pain, dyspnea on exertion, edema, orthopnea, palpitations, paroxysmal nocturnal dyspnea. Dermatological: No rash,  lesions/masses Respiratory: No cough, dyspnea Urologic: No hematuria, dysuria Abdominal:   No nausea, vomiting, diarrhea, bright red blood per rectum, melena, or hematemesis Neurologic:  No visual changes, wkns, changes in mental status. All other systems reviewed and are otherwise negative except as noted above.  Physical Exam    VS:  BP 130/68 (BP Location: Left Arm, Patient Position: Sitting, Cuff Size: Large)   Pulse (!) 56   Wt 223 lb 12.8 oz (101.5 kg)   SpO2 94% Comment: 4 Liters O2  BMI 40.93 kg/m  , BMI Body mass index is 40.93 kg/m. GEN: Well nourished, well developed, in no acute distress. HEENT: normal. Neck: Supple, no JVD, carotid bruits, or masses. Cardiac: RRR, no murmurs, rubs, or gallops. No clubbing, cyanosis, edema.  Radials/DP/PT 2+ and equal bilaterally.  Respiratory:  Respirations regular and unlabored, clear to auscultation bilaterally. GI: Soft, nontender, nondistended, BS + x 4. MS: no deformity or atrophy. Skin: warm and dry, no rash. Neuro:  Strength and sensation are intact. Psych: Normal affect.  Accessory Clinical Findings    ECG personally reviewed by me today-none today  EKG 07/21/2019 ECG personally reviewed by me today-sinus rhythm with marked sinus arrhythmia and premature atrial complexes left ventricular hypertrophy with repolarization abnormality 63 bpm- No acute changes  EKG 08/31/2017 Sinus bradycardia first-degree AV block 53 bpm  Echocardiogram 09/17/2016 Study Conclusions  - Left ventricle: The cavity size was normal. Wall thickness was increased in a pattern of moderate LVH. Systolic function was moderately reduced. The estimated ejection fraction was in the range of 35% to 40%. Diffuse hypokinesis. The study is not technically sufficient to allow evaluation of LV diastolic function. - Aortic valve: Trileaflet. Sclerosis without stenosis. There was no regurgitation. - Mitral valve: Calcified annulus. Mildly  thickened leaflets . - Left atrium: The atrium was mildly dilated. - Right ventricle: The cavity size was normal. Mildly decreased RV systolic function. - Right atrium: Moderately dilated. - Tricuspid valve: There was moderate regurgitation. - Pulmonary arteries: PA peak pressure: 49 mm Hg (S). - Inferior vena cava: The vessel was dilated. The respirophasic diameter changes were blunted (<50%), consistent with elevated central venous pressure.  Assessment & Plan   1.  Chronic combined systolic/diastolic heart failure-generalized bilateral lower extremity edema today.  No increase shortness of breath.   Continue metoprolol tartrate  12.5 mg twice daily Continue furosemide 80 mg twice daily Continue metolazone 2.5 as needed-refill Heart healthy low-sodium diet-salty 6 given Continue fluid restriction Increase physical activity as tolerated   Bradycardia-heart rate today 56 bpm.    No increased fatigue or dizziness. Continue metoprolol 12.5 Continue heart healthy diet  COPD-continues to use home O2 4 L liters through the day and 2 L at night.  Stable. Monitored by pulmonary  Persistent atrial fibrillation-heart rate today 56. Continue Xarelto 20 mg daily Continue metoprolol tartrate 25 mg twice daily  Hypercholesterolemia-LDL 130 01/04/2019 Heart healthy low-sodium high-fiber diet Increase physical activity as tolerated Monitored by PCP  Disposition: Follow-up with me or Dr. Martinique in 3 months.  Jossie Ng. [facility] [place] [address] Office [phone] Fax [phone]

## 2019-08-23 ENCOUNTER — Ambulatory Visit (INDEPENDENT_AMBULATORY_CARE_PROVIDER_SITE_OTHER): Payer: Medicare Other | Admitting: General Practice

## 2019-08-23 ENCOUNTER — Other Ambulatory Visit: Payer: Self-pay

## 2019-08-23 ENCOUNTER — Encounter: Payer: Self-pay | Admitting: General Practice

## 2019-08-23 VITALS — BP 130/68 | HR 56 | Wt 223.8 lb

## 2019-08-23 DIAGNOSIS — I5042 Chronic combined systolic (congestive) and diastolic (congestive) heart failure: Secondary | ICD-10-CM | POA: Diagnosis not present

## 2019-08-23 DIAGNOSIS — I4819 Other persistent atrial fibrillation: Secondary | ICD-10-CM

## 2019-08-23 DIAGNOSIS — E7800 Pure hypercholesterolemia, unspecified: Secondary | ICD-10-CM | POA: Diagnosis not present

## 2019-08-23 DIAGNOSIS — J9611 Chronic respiratory failure with hypoxia: Secondary | ICD-10-CM

## 2019-08-23 DIAGNOSIS — R001 Bradycardia, unspecified: Secondary | ICD-10-CM

## 2019-08-23 MED ORDER — METOLAZONE 2.5 MG PO TABS: ORAL_TABLET | ORAL | 6 refills | Status: DC

## 2019-08-23 NOTE — Patient Instructions (Signed)
Medication Instructions:  PLEASE CALL WITH YOUR METOPROLOL DOSE If you need a refill on your cardiac medications before your next appointment, please call your pharmacy.  Special Instructions: PLEASE READ AND FOLLOW SALTY 6 ATTACHED  Follow-Up: 3 months  In Person Peter Martinique, MD.    At [facility], you and your health needs are our priority.  As part of our continuing mission to provide you with exceptional heart care, we have created designated Provider Care Teams.  These Care Teams include your primary Cardiologist (physician) and Advanced Practice Providers (APPs -  Physician Assistants and Nurse Practitioners) who all work together to provide you with the care you need, when you need it.  Reduce your risk of getting COVID-19 With your heart disease it is especially important for people at increased risk of severe illness from COVID-19, and those who live with them, to protect themselves from getting COVID-19. The best way to protect yourself and to help reduce the spread of the virus that causes COVID-19 is to: Marland Kitchen Limit your interactions with other people as much as possible. . Take COVID-19 when you do interact with others. If you start feeling sick and think you may have COVID-19, get in touch with your healthcare provider within 24 hours.  Thank you for choosing [facility]!!

## 2019-08-24 DIAGNOSIS — J439 Emphysema, unspecified: Secondary | ICD-10-CM | POA: Diagnosis not present

## 2019-08-24 DIAGNOSIS — R296 Repeated falls: Secondary | ICD-10-CM | POA: Diagnosis not present

## 2019-08-24 DIAGNOSIS — J9611 Chronic respiratory failure with hypoxia: Secondary | ICD-10-CM | POA: Diagnosis not present

## 2019-08-24 DIAGNOSIS — I110 Hypertensive heart disease with heart failure: Secondary | ICD-10-CM | POA: Diagnosis not present

## 2019-08-24 DIAGNOSIS — I2510 Atherosclerotic heart disease of native coronary artery without angina pectoris: Secondary | ICD-10-CM | POA: Diagnosis not present

## 2019-08-24 DIAGNOSIS — I5030 Unspecified diastolic (congestive) heart failure: Secondary | ICD-10-CM | POA: Diagnosis not present

## 2019-08-26 ENCOUNTER — Other Ambulatory Visit: Payer: Self-pay

## 2019-08-26 DIAGNOSIS — M19012 Primary osteoarthritis, left shoulder: Secondary | ICD-10-CM | POA: Diagnosis not present

## 2019-08-26 DIAGNOSIS — E039 Hypothyroidism, unspecified: Secondary | ICD-10-CM | POA: Diagnosis not present

## 2019-08-26 DIAGNOSIS — I110 Hypertensive heart disease with heart failure: Secondary | ICD-10-CM | POA: Diagnosis not present

## 2019-08-26 DIAGNOSIS — Z9181 History of falling: Secondary | ICD-10-CM | POA: Diagnosis not present

## 2019-08-26 DIAGNOSIS — M545 Low back pain: Secondary | ICD-10-CM | POA: Diagnosis not present

## 2019-08-26 DIAGNOSIS — J9611 Chronic respiratory failure with hypoxia: Secondary | ICD-10-CM | POA: Diagnosis not present

## 2019-08-26 DIAGNOSIS — R296 Repeated falls: Secondary | ICD-10-CM | POA: Diagnosis not present

## 2019-08-26 DIAGNOSIS — Z9221 Personal history of antineoplastic chemotherapy: Secondary | ICD-10-CM | POA: Diagnosis not present

## 2019-08-26 DIAGNOSIS — G8929 Other chronic pain: Secondary | ICD-10-CM | POA: Diagnosis not present

## 2019-08-26 DIAGNOSIS — E669 Obesity, unspecified: Secondary | ICD-10-CM | POA: Diagnosis not present

## 2019-08-26 DIAGNOSIS — Z6841 Body Mass Index (BMI) 40.0 and over, adult: Secondary | ICD-10-CM | POA: Diagnosis not present

## 2019-08-26 DIAGNOSIS — I5030 Unspecified diastolic (congestive) heart failure: Secondary | ICD-10-CM | POA: Diagnosis not present

## 2019-08-26 DIAGNOSIS — J439 Emphysema, unspecified: Secondary | ICD-10-CM | POA: Diagnosis not present

## 2019-08-26 DIAGNOSIS — G4733 Obstructive sleep apnea (adult) (pediatric): Secondary | ICD-10-CM | POA: Diagnosis not present

## 2019-08-26 DIAGNOSIS — I739 Peripheral vascular disease, unspecified: Secondary | ICD-10-CM | POA: Diagnosis not present

## 2019-08-26 DIAGNOSIS — M170 Bilateral primary osteoarthritis of knee: Secondary | ICD-10-CM | POA: Diagnosis not present

## 2019-08-26 DIAGNOSIS — Z9981 Dependence on supplemental oxygen: Secondary | ICD-10-CM | POA: Diagnosis not present

## 2019-08-26 DIAGNOSIS — F418 Other specified anxiety disorders: Secondary | ICD-10-CM | POA: Diagnosis not present

## 2019-08-26 DIAGNOSIS — I2510 Atherosclerotic heart disease of native coronary artery without angina pectoris: Secondary | ICD-10-CM | POA: Diagnosis not present

## 2019-08-26 DIAGNOSIS — I4892 Unspecified atrial flutter: Secondary | ICD-10-CM | POA: Diagnosis not present

## 2019-08-26 DIAGNOSIS — K449 Diaphragmatic hernia without obstruction or gangrene: Secondary | ICD-10-CM | POA: Diagnosis not present

## 2019-08-26 DIAGNOSIS — E785 Hyperlipidemia, unspecified: Secondary | ICD-10-CM | POA: Diagnosis not present

## 2019-08-26 DIAGNOSIS — I714 Abdominal aortic aneurysm, without rupture: Secondary | ICD-10-CM | POA: Diagnosis not present

## 2019-08-26 DIAGNOSIS — Z85118 Personal history of other malignant neoplasm of bronchus and lung: Secondary | ICD-10-CM | POA: Diagnosis not present

## 2019-08-26 DIAGNOSIS — I4891 Unspecified atrial fibrillation: Secondary | ICD-10-CM | POA: Diagnosis not present

## 2019-08-26 MED ORDER — METOLAZONE 2.5 MG PO TABS: ORAL_TABLET | ORAL | 6 refills | Status: DC

## 2019-08-30 DIAGNOSIS — I2510 Atherosclerotic heart disease of native coronary artery without angina pectoris: Secondary | ICD-10-CM | POA: Diagnosis not present

## 2019-08-30 DIAGNOSIS — I5030 Unspecified diastolic (congestive) heart failure: Secondary | ICD-10-CM | POA: Diagnosis not present

## 2019-08-30 DIAGNOSIS — J439 Emphysema, unspecified: Secondary | ICD-10-CM | POA: Diagnosis not present

## 2019-08-30 DIAGNOSIS — R296 Repeated falls: Secondary | ICD-10-CM | POA: Diagnosis not present

## 2019-08-30 DIAGNOSIS — J9611 Chronic respiratory failure with hypoxia: Secondary | ICD-10-CM | POA: Diagnosis not present

## 2019-08-30 DIAGNOSIS — I110 Hypertensive heart disease with heart failure: Secondary | ICD-10-CM | POA: Diagnosis not present

## 2019-09-01 DIAGNOSIS — R296 Repeated falls: Secondary | ICD-10-CM | POA: Diagnosis not present

## 2019-09-01 DIAGNOSIS — I110 Hypertensive heart disease with heart failure: Secondary | ICD-10-CM | POA: Diagnosis not present

## 2019-09-01 DIAGNOSIS — J439 Emphysema, unspecified: Secondary | ICD-10-CM | POA: Diagnosis not present

## 2019-09-01 DIAGNOSIS — J9611 Chronic respiratory failure with hypoxia: Secondary | ICD-10-CM | POA: Diagnosis not present

## 2019-09-01 DIAGNOSIS — I2510 Atherosclerotic heart disease of native coronary artery without angina pectoris: Secondary | ICD-10-CM | POA: Diagnosis not present

## 2019-09-01 DIAGNOSIS — I5030 Unspecified diastolic (congestive) heart failure: Secondary | ICD-10-CM | POA: Diagnosis not present

## 2019-09-02 ENCOUNTER — Telehealth: Payer: Self-pay

## 2019-09-05 ENCOUNTER — Other Ambulatory Visit: Payer: Self-pay

## 2019-09-05 ENCOUNTER — Encounter: Payer: Self-pay | Admitting: Podiatry

## 2019-09-05 ENCOUNTER — Ambulatory Visit (INDEPENDENT_AMBULATORY_CARE_PROVIDER_SITE_OTHER): Payer: Medicare Other | Admitting: Podiatry

## 2019-09-05 DIAGNOSIS — M722 Plantar fascial fibromatosis: Secondary | ICD-10-CM

## 2019-09-05 NOTE — Telephone Encounter (Signed)
Bonnie Pelton, NP  Waylan Rocher, LPN 22 hours ago (9:16 PM)   Only one tablet a day PRN for weight gain. 3 pounds over night or five pounds in a week. Thank you   Message text   You  [name], NP 3 days ago   [place] faxed wanting clarification on directions for rx of Metolazone 2.5 mg. And to know if there is a max number of tablets a pt can have in a day, and is 30 tablets a 30 day supply? Rx sent stated Use as needed for weight gain with 30 tablets and 6 refills.    Routing comment

## 2019-09-05 NOTE — Progress Notes (Signed)
She presents today for follow-up of her left heel pain.  She states that is doing much better she states that she is about 90% improved and seems to be improving all the time.  Objective: Vital signs are stable she is alert and oriented x3.  Pulses remain palpable left foot still has small amount of pain to palpation medial calcaneal tubercle of the left heel.  Assessment: 90% resolved plan fasciitis left.  Plan: Offered her another injection she declined and will follow up with me immediately upon further regression.

## 2019-09-06 DIAGNOSIS — J310 Chronic rhinitis: Secondary | ICD-10-CM | POA: Diagnosis not present

## 2019-09-06 MED ORDER — METOLAZONE 2.5 MG PO TABS: 2.5000 mg | ORAL_TABLET | Freq: Every day | ORAL | 6 refills | Status: DC | PRN

## 2019-09-06 NOTE — Addendum Note (Signed)
Addended by: Waylan Rocher on: 09/06/2019 09:47 AM   Modules accepted: Orders

## 2019-09-06 NOTE — Telephone Encounter (Signed)
Sent new rx for metolazone 2.5mg  one daily PRN weight gain.

## 2019-09-07 DIAGNOSIS — I2510 Atherosclerotic heart disease of native coronary artery without angina pectoris: Secondary | ICD-10-CM | POA: Diagnosis not present

## 2019-09-07 DIAGNOSIS — I110 Hypertensive heart disease with heart failure: Secondary | ICD-10-CM | POA: Diagnosis not present

## 2019-09-07 DIAGNOSIS — J9611 Chronic respiratory failure with hypoxia: Secondary | ICD-10-CM | POA: Diagnosis not present

## 2019-09-07 DIAGNOSIS — R296 Repeated falls: Secondary | ICD-10-CM | POA: Diagnosis not present

## 2019-09-07 DIAGNOSIS — I5030 Unspecified diastolic (congestive) heart failure: Secondary | ICD-10-CM | POA: Diagnosis not present

## 2019-09-07 DIAGNOSIS — J439 Emphysema, unspecified: Secondary | ICD-10-CM | POA: Diagnosis not present

## 2019-09-09 DIAGNOSIS — R296 Repeated falls: Secondary | ICD-10-CM | POA: Diagnosis not present

## 2019-09-09 DIAGNOSIS — I5030 Unspecified diastolic (congestive) heart failure: Secondary | ICD-10-CM | POA: Diagnosis not present

## 2019-09-09 DIAGNOSIS — I110 Hypertensive heart disease with heart failure: Secondary | ICD-10-CM | POA: Diagnosis not present

## 2019-09-09 DIAGNOSIS — J9611 Chronic respiratory failure with hypoxia: Secondary | ICD-10-CM | POA: Diagnosis not present

## 2019-09-09 DIAGNOSIS — I2510 Atherosclerotic heart disease of native coronary artery without angina pectoris: Secondary | ICD-10-CM | POA: Diagnosis not present

## 2019-09-09 DIAGNOSIS — J439 Emphysema, unspecified: Secondary | ICD-10-CM | POA: Diagnosis not present

## 2019-09-12 DIAGNOSIS — I2510 Atherosclerotic heart disease of native coronary artery without angina pectoris: Secondary | ICD-10-CM | POA: Diagnosis not present

## 2019-09-12 DIAGNOSIS — J9611 Chronic respiratory failure with hypoxia: Secondary | ICD-10-CM | POA: Diagnosis not present

## 2019-09-12 DIAGNOSIS — I5030 Unspecified diastolic (congestive) heart failure: Secondary | ICD-10-CM | POA: Diagnosis not present

## 2019-09-12 DIAGNOSIS — R296 Repeated falls: Secondary | ICD-10-CM | POA: Diagnosis not present

## 2019-09-12 DIAGNOSIS — I110 Hypertensive heart disease with heart failure: Secondary | ICD-10-CM | POA: Diagnosis not present

## 2019-09-12 DIAGNOSIS — J439 Emphysema, unspecified: Secondary | ICD-10-CM | POA: Diagnosis not present

## 2019-09-15 DIAGNOSIS — I5030 Unspecified diastolic (congestive) heart failure: Secondary | ICD-10-CM | POA: Diagnosis not present

## 2019-09-15 DIAGNOSIS — I2510 Atherosclerotic heart disease of native coronary artery without angina pectoris: Secondary | ICD-10-CM | POA: Diagnosis not present

## 2019-09-15 DIAGNOSIS — J439 Emphysema, unspecified: Secondary | ICD-10-CM | POA: Diagnosis not present

## 2019-09-15 DIAGNOSIS — I110 Hypertensive heart disease with heart failure: Secondary | ICD-10-CM | POA: Diagnosis not present

## 2019-09-15 DIAGNOSIS — R296 Repeated falls: Secondary | ICD-10-CM | POA: Diagnosis not present

## 2019-09-15 DIAGNOSIS — J9611 Chronic respiratory failure with hypoxia: Secondary | ICD-10-CM | POA: Diagnosis not present

## 2019-09-19 DIAGNOSIS — G894 Chronic pain syndrome: Secondary | ICD-10-CM | POA: Diagnosis not present

## 2019-09-19 DIAGNOSIS — Z79891 Long term (current) use of opiate analgesic: Secondary | ICD-10-CM | POA: Diagnosis not present

## 2019-09-22 DIAGNOSIS — I110 Hypertensive heart disease with heart failure: Secondary | ICD-10-CM | POA: Diagnosis not present

## 2019-09-22 DIAGNOSIS — I2510 Atherosclerotic heart disease of native coronary artery without angina pectoris: Secondary | ICD-10-CM | POA: Diagnosis not present

## 2019-09-22 DIAGNOSIS — R296 Repeated falls: Secondary | ICD-10-CM | POA: Diagnosis not present

## 2019-09-22 DIAGNOSIS — I5030 Unspecified diastolic (congestive) heart failure: Secondary | ICD-10-CM | POA: Diagnosis not present

## 2019-09-22 DIAGNOSIS — J439 Emphysema, unspecified: Secondary | ICD-10-CM | POA: Diagnosis not present

## 2019-09-22 DIAGNOSIS — J9611 Chronic respiratory failure with hypoxia: Secondary | ICD-10-CM | POA: Diagnosis not present

## 2019-09-25 DIAGNOSIS — M19012 Primary osteoarthritis, left shoulder: Secondary | ICD-10-CM | POA: Diagnosis not present

## 2019-09-25 DIAGNOSIS — M170 Bilateral primary osteoarthritis of knee: Secondary | ICD-10-CM | POA: Diagnosis not present

## 2019-09-25 DIAGNOSIS — I5030 Unspecified diastolic (congestive) heart failure: Secondary | ICD-10-CM | POA: Diagnosis not present

## 2019-09-25 DIAGNOSIS — Z6841 Body Mass Index (BMI) 40.0 and over, adult: Secondary | ICD-10-CM | POA: Diagnosis not present

## 2019-09-25 DIAGNOSIS — I110 Hypertensive heart disease with heart failure: Secondary | ICD-10-CM | POA: Diagnosis not present

## 2019-09-25 DIAGNOSIS — I4891 Unspecified atrial fibrillation: Secondary | ICD-10-CM | POA: Diagnosis not present

## 2019-09-25 DIAGNOSIS — Z9221 Personal history of antineoplastic chemotherapy: Secondary | ICD-10-CM | POA: Diagnosis not present

## 2019-09-25 DIAGNOSIS — R296 Repeated falls: Secondary | ICD-10-CM | POA: Diagnosis not present

## 2019-09-25 DIAGNOSIS — F418 Other specified anxiety disorders: Secondary | ICD-10-CM | POA: Diagnosis not present

## 2019-09-25 DIAGNOSIS — M545 Low back pain: Secondary | ICD-10-CM | POA: Diagnosis not present

## 2019-09-25 DIAGNOSIS — K449 Diaphragmatic hernia without obstruction or gangrene: Secondary | ICD-10-CM | POA: Diagnosis not present

## 2019-09-25 DIAGNOSIS — G8929 Other chronic pain: Secondary | ICD-10-CM | POA: Diagnosis not present

## 2019-09-25 DIAGNOSIS — J9611 Chronic respiratory failure with hypoxia: Secondary | ICD-10-CM | POA: Diagnosis not present

## 2019-09-25 DIAGNOSIS — E039 Hypothyroidism, unspecified: Secondary | ICD-10-CM | POA: Diagnosis not present

## 2019-09-25 DIAGNOSIS — E669 Obesity, unspecified: Secondary | ICD-10-CM | POA: Diagnosis not present

## 2019-09-25 DIAGNOSIS — I2510 Atherosclerotic heart disease of native coronary artery without angina pectoris: Secondary | ICD-10-CM | POA: Diagnosis not present

## 2019-09-25 DIAGNOSIS — J439 Emphysema, unspecified: Secondary | ICD-10-CM | POA: Diagnosis not present

## 2019-09-25 DIAGNOSIS — I4892 Unspecified atrial flutter: Secondary | ICD-10-CM | POA: Diagnosis not present

## 2019-09-25 DIAGNOSIS — G4733 Obstructive sleep apnea (adult) (pediatric): Secondary | ICD-10-CM | POA: Diagnosis not present

## 2019-09-25 DIAGNOSIS — Z9181 History of falling: Secondary | ICD-10-CM | POA: Diagnosis not present

## 2019-09-25 DIAGNOSIS — Z85118 Personal history of other malignant neoplasm of bronchus and lung: Secondary | ICD-10-CM | POA: Diagnosis not present

## 2019-09-25 DIAGNOSIS — I714 Abdominal aortic aneurysm, without rupture: Secondary | ICD-10-CM | POA: Diagnosis not present

## 2019-09-25 DIAGNOSIS — I739 Peripheral vascular disease, unspecified: Secondary | ICD-10-CM | POA: Diagnosis not present

## 2019-09-25 DIAGNOSIS — Z9981 Dependence on supplemental oxygen: Secondary | ICD-10-CM | POA: Diagnosis not present

## 2019-09-25 DIAGNOSIS — E785 Hyperlipidemia, unspecified: Secondary | ICD-10-CM | POA: Diagnosis not present

## 2019-09-29 DIAGNOSIS — I5030 Unspecified diastolic (congestive) heart failure: Secondary | ICD-10-CM | POA: Diagnosis not present

## 2019-09-29 DIAGNOSIS — J439 Emphysema, unspecified: Secondary | ICD-10-CM | POA: Diagnosis not present

## 2019-09-29 DIAGNOSIS — I110 Hypertensive heart disease with heart failure: Secondary | ICD-10-CM | POA: Diagnosis not present

## 2019-09-29 DIAGNOSIS — J9611 Chronic respiratory failure with hypoxia: Secondary | ICD-10-CM | POA: Diagnosis not present

## 2019-09-29 DIAGNOSIS — R296 Repeated falls: Secondary | ICD-10-CM | POA: Diagnosis not present

## 2019-09-29 DIAGNOSIS — I2510 Atherosclerotic heart disease of native coronary artery without angina pectoris: Secondary | ICD-10-CM | POA: Diagnosis not present

## 2019-10-07 DIAGNOSIS — H52201 Unspecified astigmatism, right eye: Secondary | ICD-10-CM | POA: Diagnosis not present

## 2019-10-07 DIAGNOSIS — H53002 Unspecified amblyopia, left eye: Secondary | ICD-10-CM | POA: Diagnosis not present

## 2019-10-07 DIAGNOSIS — Z961 Presence of intraocular lens: Secondary | ICD-10-CM | POA: Diagnosis not present

## 2019-11-09 DIAGNOSIS — H6123 Impacted cerumen, bilateral: Secondary | ICD-10-CM | POA: Diagnosis not present

## 2019-11-09 DIAGNOSIS — G4733 Obstructive sleep apnea (adult) (pediatric): Secondary | ICD-10-CM | POA: Diagnosis not present

## 2019-11-14 ENCOUNTER — Other Ambulatory Visit: Payer: Self-pay

## 2019-11-14 ENCOUNTER — Ambulatory Visit (INDEPENDENT_AMBULATORY_CARE_PROVIDER_SITE_OTHER): Payer: Medicare Other | Admitting: Internal Medicine

## 2019-11-14 ENCOUNTER — Encounter: Payer: Self-pay | Admitting: Internal Medicine

## 2019-11-14 VITALS — BP 118/68 | HR 54 | Temp 98.1°F | Ht 62.0 in | Wt 222.0 lb

## 2019-11-14 DIAGNOSIS — J9611 Chronic respiratory failure with hypoxia: Secondary | ICD-10-CM

## 2019-11-14 DIAGNOSIS — J449 Chronic obstructive pulmonary disease, unspecified: Secondary | ICD-10-CM

## 2019-11-14 MED ORDER — TIOTROPIUM BROMIDE-OLODATEROL 2.5-2.5 MCG/ACT IN AERS: 2.0000 | INHALATION_SPRAY | Freq: Every day | RESPIRATORY_TRACT | 5 refills | Status: DC

## 2019-11-14 NOTE — Progress Notes (Addendum)
HPI female former smoker followed for COPD, chronic respiratory failure with hypoxia, OSA/failed CPAP, history lung CA/XRT/chemotherapy, DVT, complicated by AFib, chronic diastolic CHF  8299- Had CT scan of chest 05/06/2011 to followup of her aortic aneurysm. Showed emphysema, stable aneurysm, enlarged right lobe of thyroid, coronary disease. 2013-Cardiology has told her "heart is fine'. Dr Jana Hakim has told her no recurrence of lung Ca Office spirometry 11/28/2014-moderate restriction of exhaled volume and moderate obstruction. FVC 1.75/69%, FEV1 1.26/66%, FEV1/FVC 0.72, FEF 25-75 percent 0.77/47%. Echo 09/17/16-EF 35%-40%, PA pressure 49, IVC dilated consistent with elevated CVP, A. fib PFT 09/17/16-severe obstructive airways disease without response to dilator, severe restriction, severe reduction of diffusion. FVC 1.10/44%, FEV1 0.80/43%, ratio 0.72, TLC 67%, DLCO 25% CT chest HR 09/04/15-. There are findings in the lungs which could indicate interstitial lung disease, such is mild nonspecific interstitial pneumonia  (NSIP).  -----------------------------------------------------------------------------------------------   05/16/2019- Virtual Visit via Telephone Note  History of Present Illness: 79 year old female former smoker followed for COPD, chronic respiratory failure with hypoxia, ? ILD versus edema, OSA/failed CPAP, complicated by history lung CA/XRT/chemotherapy, DVT, AFib/ Xarelto, chronic diastolic CHF,  aortic aneurysm, O2 2- 3 L/Adapt                      Had flu vax albut hfa, Spiriva,  -----Patient reports that her breathing is doing well at this time. She reports that she wears her oxygen at night and as needed during the day.  O2 concentrator now positioned so cord can reach around her home- easier to put it on as needed during the day. Keeps portable tank in car. Uses albuterol hfa 1-2x/ week.  Grieving- brother died, daughter has a bone marrow cancer. No important changes in  her own health.    Observations/Objective:   Assessment and Plan: Chronic resp failure/ hypoxia- Continues to benefit from O2 COPD - uncomplicated- current meds appropriate  Follow Up Instructions: 6 months Baird Lyons, MD  11/14/19- 79 year old female former smoker followed for COPD, chronic respiratory failure with hypoxia, ? ILD versus edema, OSA/failed CPAP, complicated by history lung CA/XRT/chemotherapy, DVT, AFib/ Xarelto, chronic diastolic CH,  aortic aneurysm, O2 2- 4 L/Adapt           95% on 3L on arrival          albut hfa, Stiolto -----SOB with activity, 3L of O2 at all times  (Patient Saturations on 3L walking from lobby to exam room = 77% 95% at rest on 4L)  Patient Saturations on 4 Liters of oxygen while Ambulating = 95% Continues to need and benefit from O2, using 3-4 liters. Finds self-fill portable system difficult and hopes for POC- discussed.  Daughter(has amyloid) and grand daughter live at her house.  Stiolto works better than Spiriva but too expensive. Couldn't operate Anoro. She might be able to afford Stiolto by [employer].  ROS-see HPI + = positive Constitutional:   No-   weight loss, night sweats, fevers, chills, fatigue, lassitude. HEENT:   No-  headaches, difficulty swallowing, tooth/dental problems, sore throat,       No-  sneezing, itching, ear ache, nasal congestion, post nasal drip,  CV:  No-   chest pain, orthopnea, PND,  swelling in lower extremities, No-anasarca, dizziness,                     +Occasional palpitations Resp:    +shortness of breath with exertion or at rest.  No- productive cough,  +non-productive cough,  no-coughing up of blood.              No-   change in color of mucus.  No- wheezing.   Skin: No-   rash or lesions. GI:  No-   heartburn, indigestion, abdominal pain, nausea, vomiting,  GU: N MS:  No-   joint pain or swelling.  + Back pain Neuro-     nothing unusual Psych:  No- change in mood or affect. No  depression or anxiety.  No memory loss.  OBJ- Physical Exam   General- Alert, Oriented, Affect-appropriate, Distress- none, + obese. O2 3L/ POC Skin- rash-none, lesions- none, excoriation- none Lymphadenopathy- none Head- atraumatic            Eyes- Gross vision intact, PERRLA, conjunctivae and secretions clear            Ears- Hearing, canals-normal            Nose- Clear, no-Septal dev, mucus, polyps, erosion, perforation             Throat- Mallampati III , mucosa clear , drainage- none, tonsils- atrophic, + dentures Neck- flexible , trachea midline, no stridor , thyroid nl, carotid no bruit Chest - symmetrical excursion , unlabored           Heart/CV- + RRR  no murmur heard , no gallop  , no rub, nl s1 s2                           - JVD- none ,  edema +2-3 chronic, + stasis changes, varices- none           Lung- clear, wheeze- none, cough- none , dullness-none, rub- none           Chest wall-  Abd-  Br/ Gen/ Rectal- Not done, not indicated Extrem- cyanosis- none, clubbing, none, atrophy- none, strength- nl,+ Stasis changes with heavy legs Neuro- grossly intact to observation

## 2019-11-14 NOTE — Patient Instructions (Signed)
Order- printed script for [employer] Portable O2 concentrator   3L pulse   Dx chronic hypoxic respiratory failure  Script sent for Stiolto inhaler   1 month at a time. See if this price is better, or let you know if you want to change back to Spiriva.  Please call if we can help

## 2019-11-23 ENCOUNTER — Encounter: Payer: Self-pay | Admitting: Internal Medicine

## 2019-11-23 NOTE — Assessment & Plan Note (Signed)
Benefits from LABA/LAMA like Stiolto Plan- she wants to see if ordering through mail order pharmacy could be cheap enough to manage.

## 2019-11-23 NOTE — Assessment & Plan Note (Signed)
She needs and benefits from O2 but has trouble managing self-fill system. She would like POC, understanding it would be a pulse system. Plan- order POC 3-4L pulse.

## 2019-11-28 ENCOUNTER — Telehealth: Payer: Self-pay | Admitting: Internal Medicine

## 2019-11-28 NOTE — Telephone Encounter (Signed)
We didn't walk pt, will call Melissa in the morning since it is after 5pm.

## 2019-11-29 NOTE — Telephone Encounter (Signed)
Called and left message for [name] at [employer] [employer]. Patient did not walk at last office visit for a oxygen recertification. Please advise.

## 2019-11-30 ENCOUNTER — Ambulatory Visit (INDEPENDENT_AMBULATORY_CARE_PROVIDER_SITE_OTHER): Payer: Medicare Other

## 2019-11-30 ENCOUNTER — Encounter (HOSPITAL_COMMUNITY): Payer: Self-pay

## 2019-11-30 ENCOUNTER — Ambulatory Visit (HOSPITAL_COMMUNITY)
Admission: EM | Admit: 2019-11-30 | Discharge: 2019-11-30 | Disposition: A | Discharge status: Home / Self Care | Payer: Medicare Other | Attending: Family Medicine | Admitting: Family Medicine

## 2019-11-30 ENCOUNTER — Other Ambulatory Visit: Payer: Self-pay

## 2019-11-30 DIAGNOSIS — W19XXXA Unspecified fall, initial encounter: Secondary | ICD-10-CM | POA: Diagnosis not present

## 2019-11-30 DIAGNOSIS — R0602 Shortness of breath: Secondary | ICD-10-CM | POA: Diagnosis not present

## 2019-11-30 DIAGNOSIS — R0781 Pleurodynia: Secondary | ICD-10-CM | POA: Diagnosis not present

## 2019-11-30 DIAGNOSIS — S299XXA Unspecified injury of thorax, initial encounter: Secondary | ICD-10-CM | POA: Diagnosis not present

## 2019-11-30 MED ORDER — LIDOCAINE 4 % EX PTCH: 1.0000 | MEDICATED_PATCH | CUTANEOUS | 0 refills | Status: DC | PRN

## 2019-11-30 MED ORDER — TIZANIDINE HCL 2 MG PO TABS: 2.0000 mg | ORAL_TABLET | Freq: Four times a day (QID) | ORAL | 0 refills | Status: DC | PRN

## 2019-11-30 NOTE — ED Provider Notes (Addendum)
[place]   [id number] 11/30/19 Arrival Time: 1623  IO:XBDZH PAIN  SUBJECTIVE: History from: patient. [name] is a 79 y.o. female complains of right sided rib pain that began 3 days ago. Reports that she rolled over in bed and fell between the bed and the nightstand on her right side. Reports pain with inspiration. Describes the pain as constant and sharp in character. Has tried tylenol without relief. Symptoms are made worse with activity. Denies similar symptoms in the past. Denies fever, chills, erythema, ecchymosis, effusion, weakness, numbness and tingling, saddle paresthesias, loss of bowel or bladder function.      ROS: As per HPI.  All other pertinent ROS negative.     Past Medical History:  Diagnosis Date  . AAA (abdominal aortic aneurysm) ([place])   . Anticoagulant long-term use    Failed on Coumadin. On Xarelto  . Anxiety   . Anxiety and depression   . Atrial fib/flutter, transient June 2012  . Chronic lower back pain   . COPD (chronic obstructive pulmonary disease) ([place])   . DVT (deep venous thrombosis) ([place]) 2004   BLE  . Esophageal dysmotility   . Exertional dyspnea   . Hiatal hernia   . History of bronchitis   . History of fibrocystic disease of breast   . History of uterine fibroid   . HTN (hypertension)   . Hyperlipidemia   . Hypothyroidism   . Normal nuclear stress test 2012   May 2012  . OA (osteoarthritis)    "knees; left shoulder"  . OSA (obstructive sleep apnea)    "haven't been using my CPAP lately" (01/21/12)  . Ovarian mass    right benign  . Ovarian mass    benign, right  . PAD (peripheral artery disease) ([place])   . Small cell carcinoma of lung ([place]) 2004   NON-SMALL CELL CARCINOMA OF THE LUNG, METASTATIC TO THE SUPRACLAVICULAR AND MEDIASTINAL LYMPH NODES; in remission   Past Surgical History:  Procedure Laterality Date  . ABDOMINAL AORTIC ANEURYSM REPAIR  ~ 2010   stent graft  . BLADDER SURGERY  ~ 2003   sling  . BREAST  BIOPSY  1960's   both breast's - benign  . CARDIOVASCULAR STRESS TEST  2012   No ischemia  . CATARACT EXTRACTION W/ INTRAOCULAR LENS  IMPLANT, BILATERAL Bilateral ~ 2009  . REPLACEMENT TOTAL KNEE Left ~ 2008   left  . THYROIDECTOMY  ~ 1964   No Known Allergies No current facility-administered medications on file prior to encounter.   Current Outpatient Medications on File Prior to Encounter  Medication Sig Dispense Refill  . albuterol (VENTOLIN HFA) 108 (90 Base) MCG/ACT inhaler TAKE 2 PUFFS EVERY 6 HOURS AS NEEDED FORSHORTNESS OF BREATH/WHEEZING 18 g 4  . CALCIUM-MAGNESIUM-ZINC PO Take 1 tablet by mouth daily.     . Cholecalciferol (VITAMIN D) 1000 UNITS capsule Take 1,000 Units by mouth daily.      . citalopram (CELEXA) 20 MG tablet Take 20 mg by mouth daily.     . digoxin (LANOXIN) 0.125 MG tablet Take 1 tablet (125 mcg total) by mouth daily. 90 tablet 1  . furosemide (LASIX) 40 MG tablet Take 2 tablets (80 mg total) by mouth 2 (two) times daily. NEED OV. 360 tablet 0  . gabapentin (NEURONTIN) 100 MG capsule Take 1 capsule by mouth 3 (three) times daily.    Marland Kitchen levothyroxine (SYNTHROID, LEVOTHROID) 88 MCG tablet Take 88 mcg by mouth daily.    . megestrol (  MEGACE) 40 MG tablet Take 40 mg by mouth daily.     . metoprolol tartrate (LOPRESSOR) 25 MG tablet Take 1/2 tablet ( 12.5 mg ) twice a day. 90 tablet 3  . morphine (MSIR) 15 MG tablet Take 1 tablet by mouth as needed.    . potassium chloride (KLOR-CON) 10 MEQ tablet Take 1 tablet (10 mEq total) by mouth 2 (two) times daily. 180 tablet 1  . rivaroxaban (XARELTO) 20 MG TABS tablet TAKE ONE TABLET BY MOUTH DAILY WITH SUPPER. MUST SCHEDULE MD APPT FOR REFILLS 30 tablet 0  . temazepam (RESTORIL) 15 MG capsule TAKE 1 CAPSULE AT BEDTIME AS NEEDED  FOR  SLEEP 90 capsule 1  . Tiotropium Bromide-Olodaterol 2.5-2.5 MCG/ACT AERS Inhale 2 puffs into the lungs daily. 4 g 5  . vitamin B-12 (CYANOCOBALAMIN) 100 MCG tablet Take 100 mcg by mouth daily.     . metolazone (ZAROXOLYN) 2.5 MG tablet Take 1 tablet (2.5 mg total) by mouth daily as needed (ONE DAILY PRN WEIGHT GAIN). USE AS NEEDED FOR WEIGHT GAIN 30 tablet 6  . mupirocin ointment (BACTROBAN) 2 % mupirocin 2 % topical ointment  APPLY TO WOUNDS ON SKIN ONCE DAILY AT BANDAGE CHANGES    . OXYGEN Inhale 4 L into the lungs daily. Patient states she wears 2L at night and 4L when up and moving     Social History   Socioeconomic History  . Marital status: Divorced    Spouse name: Not on file  . Number of children: 2  . Years of education: Not on file  . Highest education level: Not on file  Occupational History  . Occupation: retired    Comment: Chiropractor  Tobacco Use  . Smoking status: Former Smoker    Packs/day: 1.00    Years: 40.00    Pack years: 40.00    Types: Cigarettes    Quit date: 06/23/2001    Years since quitting: 18.4  . Smokeless tobacco: Never Used  Substance and Sexual Activity  . Alcohol use: Yes    Alcohol/week: 0.0 standard drinks    Comment: once a week (glass of wine)  . Drug use: No  . Sexual activity: Never  Other Topics Concern  . Not on file  Social History Narrative  . Not on file   Social Determinants of Health   Financial Resource Strain:   . Difficulty of Paying Living Expenses:   Food Insecurity:   . Worried About Charity fundraiser in the Last Year:   . Arboriculturist in the Last Year:   Transportation Needs:   . Film/video editor (Medical):   Marland Kitchen Lack of Transportation (Non-Medical):   Physical Activity:   . Days of Exercise per Week:   . Minutes of Exercise per Session:   Stress:   . Feeling of Stress :   Social Connections:   . Frequency of Communication with Friends and Family:   . Frequency of Social Gatherings with Friends and Family:   . Attends Religious Services:   . Active Member of Clubs or Organizations:   . Attends Archivist Meetings:   Marland Kitchen Marital Status:   Intimate Partner Violence:   . Fear of  Current or Ex-Partner:   . Emotionally Abused:   Marland Kitchen Physically Abused:   . Sexually Abused:    Family History  Problem Relation Age of Onset  . Heart disease Father   . Heart attack Father   . Diabetes type II  Mother   . Diabetes Mother   . Hypertension Mother   . Heart disease Brother        before age 32  . Heart disease Sister        See's Dr. Acie Fredrickson  . Breast cancer Neg Hx     OBJECTIVE:  Vitals:   11/30/19 1658  BP: 139/76  Pulse: 64  Resp: (!) 24  Temp: 98.2 F (36.8 C)  TempSrc: Oral  SpO2: 94%    General appearance: ALERT; in no acute distress.  Head: NCAT Lungs: Normal respiratory effort CV:  pulses 2+ bilaterally. Cap refill < 2 seconds Musculoskeletal:  Inspection: Skin warm, dry, clear and intact without obvious erythema, effusion; ecchymosis present to R rib just under axilla Palpation: right ribs under axilla tender to palpation ROM: FROM active and passive Skin: warm and dry Neurologic: Ambulates without difficulty; Sensation intact about the upper/ lower extremities Psychological: alert and cooperative; normal mood and affect  DIAGNOSTIC STUDIES:  DG Chest 2 View  Result Date: 11/30/2019 CLINICAL DATA:  History of a fall Sunday evening. Shortness of breath. EXAM: CHEST - 2 VIEW COMPARISON:  11/15/2016 FINDINGS: The heart is mildly enlarged but appears stable. There is tortuosity of the thoracic aorta. Chronic bronchitic type interstitial lung changes and chronic emphysema. Basilar scarring changes without definite infiltrates or effusions. No pneumothorax. The bony thorax is grossly intact. No obvious rib or sternal fractures. Moderate degenerative changes involving the thoracic spine. IMPRESSION: 1. Chronic lung changes but no acute pulmonary findings. 2. Grossly intact bony thorax. Electronically Signed   By: Marijo Sanes M.D.   On: 11/30/2019 18:23     ASSESSMENT & PLAN:  1. Rib pain on right side   2. Fall, initial encounter       Meds  ordered this encounter  Medications  . tiZANidine (ZANAFLEX) 2 MG tablet    Sig: Take 1 tablet (2 mg total) by mouth every 6 (six) hours as needed for muscle spasms.    Dispense:  30 tablet    Refill:  0    Order Specific Question:   Supervising Provider    Answer:   Chase Picket [id number]  . Lidocaine (HM LIDOCAINE PATCH) 4 % PTCH    Sig: Apply 1 patch topically as needed.    Dispense:  15 patch    Refill:  0    Order Specific Question:   Supervising Provider    Answer:   Chase Picket [[id number]]   Rib Pain Fall Prescribed tizanidine Prescribed lidocaine patches Chest xray negative for rib fractures Continue conservative management of rest, ice, and gentle stretches Take tylenol as needed for pain relief (may cause abdominal discomfort, ulcers, and GI bleeds avoid taking with other NSAIDs) Take tizanidine at nighttime for symptomatic relief. Avoid driving or operating heavy machinery while using medication. Follow up with PCP if symptoms persist Return or go to the ER if you have any new or worsening symptoms (fever, chills, chest pain, abdominal pain, changes in bowel or bladder habits, pain radiating into lower legs)  Reviewed expectations re: course of current medical issues. Questions answered. Outlined signs and symptoms indicating need for more acute intervention. Patient verbalized understanding. After Visit Summary given.       Faustino Congress, NP 11/30/19 [place], [place], NP 11/30/19 1851

## 2019-11-30 NOTE — ED Notes (Signed)
Advised pt waiting on availability of radiology for CXR. Offered pt blanket/pillow and/or repositioning for comfort in wheelchair while awaiting radiology. Pt declined blanket/comfort/position measures.

## 2019-11-30 NOTE — ED Triage Notes (Signed)
Pt states she rolled out of bed Sunday night and fell out of bed and was caught between the bed and her night table. Pt reports that her right rib/torso area struck the night side table. Pt c/o pain to right rib cage area and mid-epigastric area that increases with inspiration or cough. Pt denies increased SOB but states her baseline is O2 via [place] at 4L.  Denies n/v/d, head trauma, or changes to bowel or bladder, blood in stool/urine.  Denies abdom pain.

## 2019-11-30 NOTE — Discharge Instructions (Addendum)
You have probably bruised your ribs  You will be sore for a few more days  Your xray was negative for any broken bones or misalignments  Follow  up as needed

## 2019-11-30 NOTE — Telephone Encounter (Signed)
LMTCB x 2 for [facility]

## 2019-12-02 NOTE — Telephone Encounter (Signed)
LMTCB x 3 for [name] and will close per protocol

## 2019-12-02 NOTE — Progress Notes (Signed)
Cardiology Office Note   Date:  12/05/2019   ID:  [name], [name] 1941-02-27, MRN [id number]  PCP:  Burnard Bunting, MD  Cardiologist:  Hulen Mandler [place], MD   Chief Complaint  Patient presents with  . Atrial Fibrillation  . Congestive Heart Failure    History of Present Illness: [name] is a 79 y.o. female with a history of persistent AF on Xarelto, D-CHF, COPD on home O2, Anxiety, DVT, HTN, HLD, hypothyroidism, OA, NSC lung CA, OSA, s/p Aobi-iliac stent graft, 4.5 cm thoracic aneurysm (Dr Early), CHAS2DS2VASC=7 (female, age x 2, DVT x 2, CHF, HTN)  She was admitted 03/26-04/01/2017 for rapid afib and CHF, not a candidate for amio w/ lung dz, Tikosyn and Sotalol contraindicated with prolonged QT. Multaq and flecainide are not good choices with CHF. Was managed with rate control and diuresis.  Seen in December 2018  by Dr. Donnetta Hutching. CT done showing stable saccular aneurysm size. She is followed by Dr. Annamaria Boots for her chronic lung disease.   She was seen in the office by Coletta Memos on 07/21/19 and again in March. She initially was seen because in-home health nurse noticing her low heart rate and increased fatigue. She was instructed to decrease her metoprolol tartrate to 25 mg twice daily. She also stated that she was taking care of her daughter who has amyloid and is receiving cancer treatment as well as her granddaughter who is mentally and physically disabled. She felt she had been managing fairly well with increased help from home health nursing. She had been seeing pulmonary who were managing her home oxygen needs.   On follow up today she reports her breathing is stable but poor. She "just doesn't feel well". No increase in swelling. Weight is down 2 lbs. Notes HR goes up when lying on left side. Occasional dizziness. Uses metolazone infrequently. She has a lot going on at home.    Past Medical History:  Diagnosis Date  . AAA (abdominal aortic aneurysm) ([place])   .  Anticoagulant long-term use    Failed on Coumadin. On Xarelto  . Anxiety   . Anxiety and depression   . Atrial fib/flutter, transient June 2012  . Chronic lower back pain   . COPD (chronic obstructive pulmonary disease) ([place])   . DVT (deep venous thrombosis) ([place]) 2004   BLE  . Esophageal dysmotility   . Exertional dyspnea   . Hiatal hernia   . History of bronchitis   . History of fibrocystic disease of breast   . History of uterine fibroid   . HTN (hypertension)   . Hyperlipidemia   . Hypothyroidism   . Normal nuclear stress test 2012   May 2012  . OA (osteoarthritis)    "knees; left shoulder"  . OSA (obstructive sleep apnea)    "haven't been using my CPAP lately" (01/21/12)  . Ovarian mass    right benign  . Ovarian mass    benign, right  . PAD (peripheral artery disease) ([place])   . Small cell carcinoma of lung ([place]) 2004   NON-SMALL CELL CARCINOMA OF THE LUNG, METASTATIC TO THE SUPRACLAVICULAR AND MEDIASTINAL LYMPH NODES; in remission    Past Surgical History:  Procedure Laterality Date  . ABDOMINAL AORTIC ANEURYSM REPAIR  ~ 2010   stent graft  . BLADDER SURGERY  ~ 2003   sling  . BREAST BIOPSY  1960's   both breast's - benign  . CARDIOVASCULAR STRESS TEST  2012   No ischemia  .  CATARACT EXTRACTION W/ INTRAOCULAR LENS  IMPLANT, BILATERAL Bilateral ~ 2009  . REPLACEMENT TOTAL KNEE Left ~ 2008   left  . THYROIDECTOMY  ~ 1964    Current Outpatient Medications  Medication Sig Dispense Refill  . albuterol (VENTOLIN HFA) 108 (90 Base) MCG/ACT inhaler TAKE 2 PUFFS EVERY 6 HOURS AS NEEDED FORSHORTNESS OF BREATH/WHEEZING 18 g 4  . CALCIUM-MAGNESIUM-ZINC PO Take 1 tablet by mouth daily.     . Cholecalciferol (VITAMIN D) 1000 UNITS capsule Take 1,000 Units by mouth daily.      . citalopram (CELEXA) 20 MG tablet Take 20 mg by mouth daily.     . digoxin (LANOXIN) 0.125 MG tablet Take 1 tablet (125 mcg total) by mouth daily. 90 tablet 1  . furosemide (LASIX) 40 MG tablet  Take 2 tablets (80 mg total) by mouth 2 (two) times daily. NEED OV. 360 tablet 0  . gabapentin (NEURONTIN) 100 MG capsule Take 1 capsule by mouth 3 (three) times daily.    Marland Kitchen levothyroxine (SYNTHROID, LEVOTHROID) 88 MCG tablet Take 88 mcg by mouth daily.    . Lidocaine (HM LIDOCAINE PATCH) 4 % PTCH Apply 1 patch topically as needed. 15 patch 0  . megestrol (MEGACE) 40 MG tablet Take 40 mg by mouth daily.     . metolazone (ZAROXOLYN) 2.5 MG tablet Take 1 tablet (2.5 mg total) by mouth daily as needed (ONE DAILY PRN WEIGHT GAIN). USE AS NEEDED FOR WEIGHT GAIN 30 tablet 6  . metoprolol tartrate (LOPRESSOR) 25 MG tablet Take 1/2 tablet ( 12.5 mg ) twice a day. 90 tablet 3  . morphine (MSIR) 15 MG tablet Take 1 tablet by mouth as needed.    . mupirocin ointment (BACTROBAN) 2 % mupirocin 2 % topical ointment  APPLY TO WOUNDS ON SKIN ONCE DAILY AT BANDAGE CHANGES    . OXYGEN Inhale 4 L into the lungs daily. Patient states she wears 2L at night and 4L when up and moving    . potassium chloride (KLOR-CON) 10 MEQ tablet Take 1 tablet (10 mEq total) by mouth 2 (two) times daily. 180 tablet 1  . rivaroxaban (XARELTO) 20 MG TABS tablet TAKE ONE TABLET BY MOUTH DAILY WITH SUPPER. MUST SCHEDULE MD APPT FOR REFILLS 30 tablet 0  . temazepam (RESTORIL) 15 MG capsule TAKE 1 CAPSULE AT BEDTIME AS NEEDED  FOR  SLEEP 90 capsule 1  . Tiotropium Bromide-Olodaterol 2.5-2.5 MCG/ACT AERS Inhale 2 puffs into the lungs daily. 4 g 5  . tiZANidine (ZANAFLEX) 2 MG tablet Take 1 tablet (2 mg total) by mouth every 6 (six) hours as needed for muscle spasms. 30 tablet 0  . vitamin B-12 (CYANOCOBALAMIN) 100 MCG tablet Take 100 mcg by mouth daily.     No current facility-administered medications for this visit.    Allergies:   Patient has no known allergies.    Social History:  The patient  reports that she quit smoking about 18 years ago. Her smoking use included cigarettes. She has a 40.00 pack-year smoking history. She has  never used smokeless tobacco. She reports current alcohol use. She reports that she does not use drugs.   Family History:  The patient's family history includes Diabetes in her mother; Diabetes type II in her mother; Heart attack in her father; Heart disease in her brother, father, and sister; Hypertension in her mother.    ROS:  Please see the history of present illness. All other systems are reviewed and negative.    PHYSICAL  EXAM: VS:  BP (!) 142/70   Pulse 68   Ht 5\' 2"  (1.575 m)   Wt 220 lb (99.8 kg)   SpO2 91%   BMI 40.24 kg/m  , BMI Body mass index is 40.24 kg/m. GENERAL:  Well appearing, obese WF in NAD HEENT:  PERRL, EOMI, sclera are clear. Oropharynx is clear. NECK:  Mild jugular venous distention, carotid upstroke brisk and symmetric, no bruits, no thyromegaly or adenopathy LUNGS:  Bilateral crackles at the bases. CHEST:  Unremarkable HEART:  IRRR,  PMI not displaced or sustained,S1 and S2 within normal limits, no S3, no S4: no clicks, no rubs, no murmurs ABD:  Soft, nontender. BS +, no masses or bruits. No hepatomegaly, no splenomegaly EXT:  2 + pulses throughout, 1-2+ edema, no cyanosis no clubbing SKIN:  Warm and dry.  No rashes NEURO:  Alert and oriented x 3. Cranial nerves II through XII intact. PSYCH:  Cognitively intact     EKG:  EKG is not ordered today.   ECHO: 09/17/2016 - Left ventricle: The cavity size was normal. Wall thickness was increased in a pattern of moderate LVH. Systolic function was moderately reduced. The estimated ejection fraction was in the range of 35% to 40%. Diffuse hypokinesis. The study is not technically sufficient to allow evaluation of LV diastolicfunction. - Aortic valve: Trileaflet. Sclerosis without stenosis. There was no regurgitation. - Mitral valve: Calcified annulus. Mildly thickened leaflets . - Left atrium: The atrium was mildly dilated. - Right ventricle: The cavity size was normal. Mildly decreased  RV systolic function. - Right atrium: Moderately dilated. - Tricuspid valve: There was moderate regurgitation. - Pulmonary arteries: PA peak pressure: 49 mm Hg (S). - Inferior vena cava: The vessel was dilated. The respirophasic diameter changes were blunted (<50%), consistent with elevated central venous pressure. Impressions: - Compared to a prior study in 2016, the LVEF is reduced now to 35-40%. A-fib wtih RVR is noted. The ascending aorta meausres 4.0 cm. There is mild AI, mild LAE, moderate RAE, moderate TR and RVSP of 49 mmHg with a dilated IVC.  Recent Labs: 01/15/2019: ALT 18; Hemoglobin 15.7; Platelets 131 07/21/2019: BUN 13; Creatinine, Ser 1.00; Potassium 4.3; Sodium 140  09/26/16; Dig level 0.6  Lipid Panel No results found for: CHOL, TRIG, HDL, CHOLHDL, VLDL, LDLCALC, LDLDIRECT   Wt Readings from Last 3 Encounters:  12/05/19 220 lb (99.8 kg)  11/14/19 222 lb (100.7 kg)  08/23/19 223 lb 12.8 oz (101.5 kg)     Other studies Reviewed: Labs 12/10/15: cholesterol 136, triglycerides 82, HDL 38, LDL 82.  Dated 12/25/16: cholesterol 182, triglycerides 156, HDL 27, LDL 124. Chemistries and TSH normal. Dated 12/31/17: cholesterol 173, triglycerides 187, HDL 32, LDL 104. Chemistries and TSH normal. Dated 03/11/18: CBC normal.   ASSESSMENT AND PLAN:  1.  Chronic combined systolic/diastolic CHF: EF 27-51%.  Her weight and swelling are stable. She is on lasix 80 mg bid.   Reinforced low sodium diet and fluid restriction. Recommend she weigh daily and if weight increases 2-3 lbs in a day or 5 lbs in a week then she is to take extra lasix or metolazone until weight returns to baseline. On lanoxin.   2.  COPD: Chronic respiratory failure on home Oxygen. Stable.  3.  Persistent atrial fibrillation:rate is well controlled. HR improved with reduction in metoprolol dose. She is anticoagulated with Xarelto.  4. Hypercholesterolemia. Mild. Intolerant of statin.  Disposition:    FU 6 months.  Signed, Nhyla Nappi [place], MD  12/05/2019 1:41 PM    [facility]

## 2019-12-05 ENCOUNTER — Encounter: Payer: Self-pay | Admitting: Cardiology

## 2019-12-05 ENCOUNTER — Other Ambulatory Visit: Payer: Self-pay

## 2019-12-05 ENCOUNTER — Ambulatory Visit (INDEPENDENT_AMBULATORY_CARE_PROVIDER_SITE_OTHER): Payer: Medicare Other | Admitting: Cardiology

## 2019-12-05 VITALS — BP 142/70 | HR 68 | Ht 62.0 in | Wt 220.0 lb

## 2019-12-05 DIAGNOSIS — I5042 Chronic combined systolic (congestive) and diastolic (congestive) heart failure: Secondary | ICD-10-CM | POA: Diagnosis not present

## 2019-12-05 DIAGNOSIS — I4819 Other persistent atrial fibrillation: Secondary | ICD-10-CM | POA: Diagnosis not present

## 2019-12-05 DIAGNOSIS — J9611 Chronic respiratory failure with hypoxia: Secondary | ICD-10-CM | POA: Diagnosis not present

## 2019-12-06 ENCOUNTER — Telehealth: Payer: Self-pay | Admitting: Internal Medicine

## 2019-12-06 NOTE — Telephone Encounter (Signed)
Melissa from [employer] called, qualifying walk from 5/54/2021 placed in care coordination note, found in MD notes and flow sheets.

## 2019-12-14 ENCOUNTER — Other Ambulatory Visit: Payer: Self-pay

## 2019-12-14 DIAGNOSIS — I712 Thoracic aortic aneurysm, without rupture, unspecified: Secondary | ICD-10-CM

## 2020-01-06 ENCOUNTER — Other Ambulatory Visit: Payer: Medicare Other

## 2020-01-09 ENCOUNTER — Other Ambulatory Visit: Payer: Self-pay

## 2020-01-09 DIAGNOSIS — I714 Abdominal aortic aneurysm, without rupture, unspecified: Secondary | ICD-10-CM

## 2020-01-10 ENCOUNTER — Ambulatory Visit: Payer: Medicare Other | Admitting: Vascular Surgery

## 2020-01-10 ENCOUNTER — Other Ambulatory Visit: Payer: Self-pay | Admitting: General Practice

## 2020-01-19 DIAGNOSIS — E038 Other specified hypothyroidism: Secondary | ICD-10-CM | POA: Diagnosis not present

## 2020-01-19 DIAGNOSIS — E7849 Other hyperlipidemia: Secondary | ICD-10-CM | POA: Diagnosis not present

## 2020-01-26 DIAGNOSIS — I10 Essential (primary) hypertension: Secondary | ICD-10-CM | POA: Diagnosis not present

## 2020-01-26 DIAGNOSIS — Z9981 Dependence on supplemental oxygen: Secondary | ICD-10-CM | POA: Diagnosis not present

## 2020-01-26 DIAGNOSIS — I509 Heart failure, unspecified: Secondary | ICD-10-CM | POA: Diagnosis not present

## 2020-01-26 DIAGNOSIS — E039 Hypothyroidism, unspecified: Secondary | ICD-10-CM | POA: Diagnosis not present

## 2020-01-26 DIAGNOSIS — Z0000 Encounter for general adult medical examination without abnormal findings: Secondary | ICD-10-CM | POA: Diagnosis not present

## 2020-01-26 DIAGNOSIS — I480 Paroxysmal atrial fibrillation: Secondary | ICD-10-CM | POA: Diagnosis not present

## 2020-01-26 DIAGNOSIS — M1990 Unspecified osteoarthritis, unspecified site: Secondary | ICD-10-CM | POA: Diagnosis not present

## 2020-01-26 DIAGNOSIS — I872 Venous insufficiency (chronic) (peripheral): Secondary | ICD-10-CM | POA: Diagnosis not present

## 2020-01-26 DIAGNOSIS — C3490 Malignant neoplasm of unspecified part of unspecified bronchus or lung: Secondary | ICD-10-CM | POA: Diagnosis not present

## 2020-01-26 DIAGNOSIS — I739 Peripheral vascular disease, unspecified: Secondary | ICD-10-CM | POA: Diagnosis not present

## 2020-02-03 DIAGNOSIS — Z1212 Encounter for screening for malignant neoplasm of rectum: Secondary | ICD-10-CM | POA: Diagnosis not present

## 2020-02-14 ENCOUNTER — Ambulatory Visit (INDEPENDENT_AMBULATORY_CARE_PROVIDER_SITE_OTHER): Payer: Medicare Other | Admitting: Vascular Surgery

## 2020-02-14 ENCOUNTER — Ambulatory Visit
Admission: RE | Admit: 2020-02-14 | Discharge: 2020-02-14 | Disposition: A | Discharge status: Home / Self Care | Payer: Medicare Other | Source: Ambulatory Visit | Attending: Vascular Surgery | Admitting: Vascular Surgery

## 2020-02-14 DIAGNOSIS — I700 Atherosclerosis of aorta: Secondary | ICD-10-CM | POA: Diagnosis not present

## 2020-02-14 DIAGNOSIS — N838 Other noninflammatory disorders of ovary, fallopian tube and broad ligament: Secondary | ICD-10-CM | POA: Diagnosis not present

## 2020-02-14 DIAGNOSIS — I712 Thoracic aortic aneurysm, without rupture, unspecified: Secondary | ICD-10-CM

## 2020-02-14 DIAGNOSIS — I714 Abdominal aortic aneurysm, without rupture, unspecified: Secondary | ICD-10-CM

## 2020-02-14 DIAGNOSIS — J432 Centrilobular emphysema: Secondary | ICD-10-CM | POA: Diagnosis not present

## 2020-02-14 MED ORDER — IOPAMIDOL (ISOVUE-370) INJECTION 76%
75.0000 mL | Freq: Once | INTRAVENOUS | Status: AC | PRN
  Administered 2020-02-14: 75 mL via INTRAVENOUS

## 2020-02-15 ENCOUNTER — Telehealth: Payer: Self-pay | Admitting: *Deleted

## 2020-02-15 NOTE — Progress Notes (Signed)
Virtual Visit via Telephone Note    I connected with [name] on 02/15/2020 by telephone and verified that I was speaking with the correct person using two identifiers. Patient was located at home and accompanied by no one. I am located at [facility].   The limitations of evaluation and management by telemedicine and the availability of in person appointments have been previously discussed with the patient and are documented in the patients chart. The patient expressed understanding and consented to proceed.  PCP: Burnard Bunting, MD   History of Present Illness: [name] is a 79 y.o. female with history of infrarenal abdominal aortic aneurysm repair with stent graft.  Also has known saccular descending thoracic aneurysm.  She underwent CT scan on 02/14/2020 for follow-up of this.  She reported that she was feeling somewhat faint due to the heat and humidity and was not seen in person.  I am calling her to update her on her CT scan findings  Past Medical History:  Diagnosis Date  . AAA (abdominal aortic aneurysm) ([place])   . Anticoagulant long-term use    Failed on Coumadin. On Xarelto  . Anxiety   . Anxiety and depression   . Atrial fib/flutter, transient June 2012  . Chronic lower back pain   . COPD (chronic obstructive pulmonary disease) ([place])   . DVT (deep venous thrombosis) ([place]) 2004   BLE  . Esophageal dysmotility   . Exertional dyspnea   . Hiatal hernia   . History of bronchitis   . History of fibrocystic disease of breast   . History of uterine fibroid   . HTN (hypertension)   . Hyperlipidemia   . Hypothyroidism   . Normal nuclear stress test 2012   May 2012  . OA (osteoarthritis)    "knees; left shoulder"  . OSA (obstructive sleep apnea)    "haven't been using my CPAP lately" (01/21/12)  . Ovarian mass    right benign  . Ovarian mass    benign, right  . PAD (peripheral artery disease) ([place])   . Small cell carcinoma of lung ([place]) 2004     NON-SMALL CELL CARCINOMA OF THE LUNG, METASTATIC TO THE SUPRACLAVICULAR AND MEDIASTINAL LYMPH NODES; in remission    Past Surgical History:  Procedure Laterality Date  . ABDOMINAL AORTIC ANEURYSM REPAIR  ~ 2010   stent graft  . BLADDER SURGERY  ~ 2003   sling  . BREAST BIOPSY  1960's   both breast's - benign  . CARDIOVASCULAR STRESS TEST  2012   No ischemia  . CATARACT EXTRACTION W/ INTRAOCULAR LENS  IMPLANT, BILATERAL Bilateral ~ 2009  . REPLACEMENT TOTAL KNEE Left ~ 2008   left  . THYROIDECTOMY  ~ 1964    No outpatient medications have been marked as taking for the 02/14/20 encounter (Office Visit) with Rosetta Posner, MD.    12 system ROS was negative unless otherwise noted in HPI   Observations/Objective: CT scan was reviewed and discussed with the patient.  This shows no change in her saccular descending thoracic aneurysm.  That is approximately 2-1/2 to 3 cm.  Total diameter of the thoracic aorta including the saccular outpouching is approximately 4.3 cm.  Infrarenal aorta stent graft repair looks good with no evidence of endoleak and no expansion of her native sac  Assessment and Plan: Stable status post stent graft repair abdominal aortic aneurysm.  Stable saccular aneurysm of her descending thoracic aorta  Follow Up Instructions:  She will continue her usual activities without limitation.  We will see her again in our office in 2 years with repeat CT of chest abdomen and pelvis   I discussed the assessment and treatment plan with the patient. The patient was provided an opportunity to ask questions and all were answered. The patient agreed with the plan and demonstrated an understanding of the instructions.   The patient was advised to call back or seek an in-person evaluation if the symptoms worsen or if the condition fails to improve as anticipated.  I spent 5-10 minutes with the patient via telephone encounter.   Annamary Rummage [facility] [place] Office: [phone]  02/15/2020, 3:08 PM

## 2020-02-15 NOTE — Telephone Encounter (Signed)
Pt called concerned that Dr. Donnetta Hutching would not call her about her CT scan now that he was moving to the [place] office. Reassured her that he would call about her results.

## 2020-03-22 DIAGNOSIS — M5136 Other intervertebral disc degeneration, lumbar region: Secondary | ICD-10-CM | POA: Diagnosis not present

## 2020-03-22 DIAGNOSIS — Z79891 Long term (current) use of opiate analgesic: Secondary | ICD-10-CM | POA: Diagnosis not present

## 2020-03-22 DIAGNOSIS — G894 Chronic pain syndrome: Secondary | ICD-10-CM | POA: Diagnosis not present

## 2020-03-22 DIAGNOSIS — Z79899 Other long term (current) drug therapy: Secondary | ICD-10-CM | POA: Diagnosis not present

## 2020-03-26 DIAGNOSIS — Z79899 Other long term (current) drug therapy: Secondary | ICD-10-CM | POA: Diagnosis not present

## 2020-03-26 DIAGNOSIS — Z5181 Encounter for therapeutic drug level monitoring: Secondary | ICD-10-CM | POA: Diagnosis not present

## 2020-03-27 DIAGNOSIS — Z23 Encounter for immunization: Secondary | ICD-10-CM | POA: Diagnosis not present

## 2020-04-03 ENCOUNTER — Other Ambulatory Visit: Payer: Self-pay | Admitting: Internal Medicine

## 2020-04-03 DIAGNOSIS — Z1231 Encounter for screening mammogram for malignant neoplasm of breast: Secondary | ICD-10-CM

## 2020-04-05 ENCOUNTER — Other Ambulatory Visit: Payer: Self-pay | Admitting: General Practice

## 2020-04-09 ENCOUNTER — Other Ambulatory Visit: Payer: Self-pay | Admitting: Internal Medicine

## 2020-04-13 ENCOUNTER — Other Ambulatory Visit: Payer: Self-pay | Admitting: Internal Medicine

## 2020-04-13 MED ORDER — TIOTROPIUM BROMIDE-OLODATEROL 2.5-2.5 MCG/ACT IN AERS: 2.0000 | INHALATION_SPRAY | Freq: Every day | RESPIRATORY_TRACT | 4 refills | Status: DC

## 2020-04-16 ENCOUNTER — Other Ambulatory Visit: Payer: Self-pay | Admitting: General Practice

## 2020-04-18 ENCOUNTER — Other Ambulatory Visit: Payer: Self-pay

## 2020-04-25 DIAGNOSIS — Z23 Encounter for immunization: Secondary | ICD-10-CM | POA: Diagnosis not present

## 2020-05-10 ENCOUNTER — Telehealth: Payer: Self-pay | Admitting: Cardiology

## 2020-05-10 NOTE — Telephone Encounter (Signed)
Patient calling the office for samples of medication:   1.  What medication and dosage are you requesting samples for? rivaroxaban (XARELTO) 20 MG TABS tablet  2.  Are you currently out of this medication? Yes

## 2020-05-10 NOTE — Telephone Encounter (Signed)
Spoke with patient. Patient to pick up 2 weeks worth of samples at front desk for 20mg  of Xarelto.

## 2020-05-14 ENCOUNTER — Ambulatory Visit: Payer: Medicare Other | Admitting: Internal Medicine

## 2020-05-14 ENCOUNTER — Ambulatory Visit: Payer: Medicare Other

## 2020-05-23 DIAGNOSIS — I4819 Other persistent atrial fibrillation: Secondary | ICD-10-CM | POA: Diagnosis not present

## 2020-05-23 DIAGNOSIS — Z7901 Long term (current) use of anticoagulants: Secondary | ICD-10-CM | POA: Diagnosis not present

## 2020-06-21 NOTE — Progress Notes (Signed)
Cardiology Office Note   Date:  06/27/2020   ID:  [name], [name] 12-23-1940, MRN [id number]  PCP:  Burnard Bunting, MD  Cardiologist:  Lezlee Gills [place], MD   Chief Complaint  Patient presents with  . Follow-up    6 months.  . Chest Pain    Discomfort.  . Shortness of Breath  . Edema    History of Present Illness: [name] is a 79 y.o. female with a history of persistent AF on Xarelto, D-CHF, COPD on home O2, Anxiety, DVT, HTN, HLD, hypothyroidism, OA, NSC lung CA, OSA, s/p Aobi-iliac stent graft, 4.5 cm thoracic aneurysm (Dr Early), CHAS2DS2VASC=7 (female, age x 2, DVT x 2, CHF, HTN)  She was admitted 03/26-04/01/2017 for rapid afib and CHF, not a candidate for amio w/ lung dz, Tikosyn and Sotalol contraindicated with prolonged QT. Multaq and flecainide are not good choices with CHF. Was managed with rate control and diuresis.  Seen in December 2018  by Dr. Donnetta Hutching. CT done in August 2021 showing stable saccular aneurysm size. She is followed by Dr. Annamaria Boots for her chronic lung disease.   She was seen in the office by Coletta Memos on 07/21/19 and again in March. She initially was seen because in-home health nurse noticing her low heart rate and increased fatigue. She was instructed to decrease her metoprolol tartrate to 25 mg twice daily. She also stated that she was taking care of her daughter who has amyloid and is receiving cancer treatment as well as her granddaughter who is mentally and physically disabled. She felt she had been managing fairly well with increased help from home health nursing. She had been seeing pulmonary who were managing her home oxygen needs.   On follow up today she states she is getting by. Has chronic SOB. Not aware of arrhythmia. Notes primary care switched her from Xarelto to Eliquis due to cost and availability of samples. States she could not afford Xarelto. Hasn't checked with insurance to see about the cost of Eliquis. Doesn't think she  can manage going to have blood drawn if she switched to coumadin.  Past Medical History:  Diagnosis Date  . AAA (abdominal aortic aneurysm) ([place])   . Anticoagulant long-term use    Failed on Coumadin. On Xarelto  . Anxiety   . Anxiety and depression   . Atrial fib/flutter, transient June 2012  . Chronic lower back pain   . COPD (chronic obstructive pulmonary disease) ([place])   . DVT (deep venous thrombosis) ([place]) 2004   BLE  . Esophageal dysmotility   . Exertional dyspnea   . Hiatal hernia   . History of bronchitis   . History of fibrocystic disease of breast   . History of uterine fibroid   . HTN (hypertension)   . Hyperlipidemia   . Hypothyroidism   . Normal nuclear stress test 2012   May 2012  . OA (osteoarthritis)    "knees; left shoulder"  . OSA (obstructive sleep apnea)    "haven't been using my CPAP lately" (01/21/12)  . Ovarian mass    right benign  . Ovarian mass    benign, right  . PAD (peripheral artery disease) ([place])   . Small cell carcinoma of lung ([place]) 2004   NON-SMALL CELL CARCINOMA OF THE LUNG, METASTATIC TO THE SUPRACLAVICULAR AND MEDIASTINAL LYMPH NODES; in remission    Past Surgical History:  Procedure Laterality Date  . ABDOMINAL AORTIC ANEURYSM REPAIR  ~ 2010   stent graft  .  BLADDER SURGERY  ~ 2003   sling  . BREAST BIOPSY  1960's   both breast's - benign  . CARDIOVASCULAR STRESS TEST  2012   No ischemia  . CATARACT EXTRACTION W/ INTRAOCULAR LENS  IMPLANT, BILATERAL Bilateral ~ 2009  . REPLACEMENT TOTAL KNEE Left ~ 2008   left  . THYROIDECTOMY  ~ 1964    Current Outpatient Medications  Medication Sig Dispense Refill  . albuterol (VENTOLIN HFA) 108 (90 Base) MCG/ACT inhaler TAKE 2 PUFFS EVERY 6 HOURS AS NEEDED FORSHORTNESS OF BREATH/WHEEZING 18 g 4  . apixaban (ELIQUIS) 5 MG TABS tablet Take 1 tablet (5 mg total) by mouth 2 (two) times daily. 60 tablet   . CALCIUM-MAGNESIUM-ZINC PO Take 1 tablet by mouth daily.     . Cholecalciferol  (VITAMIN D) 1000 UNITS capsule Take 1,000 Units by mouth daily.    . citalopram (CELEXA) 20 MG tablet Take 20 mg by mouth daily.    . digoxin (LANOXIN) 0.125 MG tablet TAKE 1 TABLET (125 MCG TOTAL) BY MOUTH DAILY. 90 tablet 3  . gabapentin (NEURONTIN) 100 MG capsule Take 1 capsule by mouth 3 (three) times daily.    Marland Kitchen levothyroxine (SYNTHROID, LEVOTHROID) 88 MCG tablet Take 88 mcg by mouth daily.    . Lidocaine (HM LIDOCAINE PATCH) 4 % PTCH Apply 1 patch topically as needed. 15 patch 0  . megestrol (MEGACE) 40 MG tablet Take 40 mg by mouth daily.     . metolazone (ZAROXOLYN) 2.5 MG tablet Take 1 tablet (2.5 mg total) by mouth daily as needed (ONE DAILY PRN WEIGHT GAIN). USE AS NEEDED FOR WEIGHT GAIN 30 tablet 6  . metoprolol tartrate (LOPRESSOR) 25 MG tablet Take 1/2 tablet ( 12.5 mg ) twice a day. 90 tablet 3  . morphine (MSIR) 15 MG tablet Take 1 tablet by mouth as needed.    . mupirocin ointment (BACTROBAN) 2 % mupirocin 2 % topical ointment  APPLY TO WOUNDS ON SKIN ONCE DAILY AT BANDAGE CHANGES    . OXYGEN Inhale 4 L into the lungs daily. Patient states she wears 2L at night and 4L when up and moving    . potassium chloride (KLOR-CON) 10 MEQ tablet TAKE 1 TABLET (10 MEQ TOTAL) BY MOUTH 2 (TWO) TIMES DAILY. 180 tablet 1  . temazepam (RESTORIL) 15 MG capsule TAKE 1 CAPSULE AT BEDTIME AS NEEDED  FOR  SLEEP 90 capsule 1  . Tiotropium Bromide-Olodaterol 2.5-2.5 MCG/ACT AERS Inhale 2 puffs into the lungs daily. 12 g 4  . tiZANidine (ZANAFLEX) 2 MG tablet Take 1 tablet (2 mg total) by mouth every 6 (six) hours as needed for muscle spasms. 30 tablet 0  . vitamin B-12 (CYANOCOBALAMIN) 100 MCG tablet Take 100 mcg by mouth daily.    . furosemide (LASIX) 40 MG tablet Take 2 tablets (80 mg total) by mouth daily. NEED OV. 360 tablet 3   No current facility-administered medications for this visit.    Allergies:   Patient has no known allergies.    Social History:  The patient  reports that she quit  smoking about 19 years ago. Her smoking use included cigarettes. She has a 40.00 pack-year smoking history. She has never used smokeless tobacco. She reports current alcohol use. She reports that she does not use drugs.   Family History:  The patient's family history includes Diabetes in her mother; Diabetes type II in her mother; Heart attack in her father; Heart disease in her brother, father, and sister; Hypertension in  her mother.    ROS:  Please see the history of present illness. All other systems are reviewed and negative.    PHYSICAL EXAM: VS:  BP (!) 110/56 (BP Location: Left Arm, Patient Position: Sitting, Cuff Size: Large)   Pulse 75   Ht 5\' 2"  (1.575 m)   Wt 213 lb (96.6 kg)   BMI 38.96 kg/m  , BMI Body mass index is 38.96 kg/m. GENERAL: chronically ill appearing, obese WF in NAD. On oxygen. Kyphotic.  HEENT:  PERRL, EOMI, sclera are clear. Oropharynx is clear. NECK:  Mild jugular venous distention, carotid upstroke brisk and symmetric, no bruits, no thyromegaly or adenopathy LUNGS:  Bilateral crackles at the bases. CHEST:  Unremarkable HEART:  IRRR,  PMI not displaced or sustained,S1 and S2 within normal limits, no S3, no S4: no clicks, no rubs, no murmurs ABD:  Soft, nontender. BS +, no masses or bruits. No hepatomegaly, no splenomegaly EXT:  2 + pulses throughout, 1+ edema, no cyanosis no clubbing SKIN:  Warm and dry.  No rashes NEURO:  Alert and oriented x 3. Cranial nerves II through XII intact. PSYCH:  Cognitively intact  EKG:  EKG is  ordered today. Afib with rate 75 bpm. LAD, LVH, nonspecific ST abnormality. I have personally reviewed and interpreted this study.   ECHO: 09/17/2016 - Left ventricle: The cavity size was normal. Wall thickness was increased in a pattern of moderate LVH. Systolic function was moderately reduced. The estimated ejection fraction was in the range of 35% to 40%. Diffuse hypokinesis. The study is not technically sufficient to  allow evaluation of LV diastolicfunction. - Aortic valve: Trileaflet. Sclerosis without stenosis. There was no regurgitation. - Mitral valve: Calcified annulus. Mildly thickened leaflets . - Left atrium: The atrium was mildly dilated. - Right ventricle: The cavity size was normal. Mildly decreased RV systolic function. - Right atrium: Moderately dilated. - Tricuspid valve: There was moderate regurgitation. - Pulmonary arteries: PA peak pressure: 49 mm Hg (S). - Inferior vena cava: The vessel was dilated. The respirophasic diameter changes were blunted (<50%), consistent with elevated central venous pressure. Impressions: - Compared to a prior study in 2016, the LVEF is reduced now to 35-40%. A-fib wtih RVR is noted. The ascending aorta meausres 4.0 cm. There is mild AI, mild LAE, moderate RAE, moderate TR and RVSP of 49 mmHg with a dilated IVC.  Recent Labs: 07/21/2019: BUN 13; Creatinine, Ser 1.00; Potassium 4.3; Sodium 140  09/26/16; Dig level 0.6  Lipid Panel No results found for: CHOL, TRIG, HDL, CHOLHDL, VLDL, LDLCALC, LDLDIRECT   Wt Readings from Last 3 Encounters:  06/27/20 213 lb (96.6 kg)  12/05/19 220 lb (99.8 kg)  11/14/19 222 lb (100.7 kg)     Other studies Reviewed: Labs 12/10/15: cholesterol 136, triglycerides 82, HDL 38, LDL 82.  Dated 12/25/16: cholesterol 182, triglycerides 156, HDL 27, LDL 124. Chemistries and TSH normal. Dated 12/31/17: cholesterol 173, triglycerides 187, HDL 32, LDL 104. Chemistries and TSH normal. Dated 03/11/18: CBC normal.  Dated 07/21/19: dig level 0.6 Dated 01/19/20: cholesterol 176, triglycerides 120, HDL 32, LDL 120. Otherwise CBC, CMET and TFTs normal.   ASSESSMENT AND PLAN:  1.  Chronic combined systolic/diastolic CHF: EF 60-63%.  Her weight is down and swelling is stable. States she is only taking lasix 80 mg daily instead of bid.    Reinforced low sodium diet and fluid restriction. Doesn't take metolazone.  On lanoxin.    2.  COPD: Chronic respiratory failure on home Oxygen.  Stable.  3.  Persistent atrial fibrillation:rate is well controlled.  She is anticoagulated with Eliquis currently. Not sure if this will be manageable long term due to cost. We did give some samples today.   4. Hypercholesterolemia. Mild. Intolerant of statin.  Disposition:   FU 6 months.  Signed, Patricio Popwell Martinique, MD  06/27/2020 1:46 PM    [facility]

## 2020-06-27 ENCOUNTER — Ambulatory Visit: Payer: Medicare Other

## 2020-06-27 ENCOUNTER — Encounter: Payer: Self-pay | Admitting: Cardiology

## 2020-06-27 ENCOUNTER — Ambulatory Visit (INDEPENDENT_AMBULATORY_CARE_PROVIDER_SITE_OTHER): Payer: Medicare Other | Admitting: Cardiology

## 2020-06-27 ENCOUNTER — Other Ambulatory Visit: Payer: Self-pay

## 2020-06-27 VITALS — BP 110/56 | HR 75 | Ht 62.0 in | Wt 213.0 lb

## 2020-06-27 DIAGNOSIS — J9611 Chronic respiratory failure with hypoxia: Secondary | ICD-10-CM | POA: Diagnosis not present

## 2020-06-27 DIAGNOSIS — I5032 Chronic diastolic (congestive) heart failure: Secondary | ICD-10-CM

## 2020-06-27 DIAGNOSIS — I4819 Other persistent atrial fibrillation: Secondary | ICD-10-CM | POA: Diagnosis not present

## 2020-06-27 MED ORDER — FUROSEMIDE 40 MG PO TABS: 80.0000 mg | ORAL_TABLET | Freq: Every day | ORAL | 3 refills | Status: DC

## 2020-06-27 MED ORDER — APIXABAN 5 MG PO TABS
5.0000 mg | ORAL_TABLET | Freq: Two times a day (BID) | ORAL | Status: DC
Start: 2020-06-27 — End: 2022-04-18

## 2020-07-04 ENCOUNTER — Ambulatory Visit: Payer: Medicare Other

## 2020-07-18 ENCOUNTER — Ambulatory Visit: Payer: Medicare Other | Admitting: Internal Medicine

## 2020-08-15 ENCOUNTER — Inpatient Hospital Stay: Admission: RE | Admit: 2020-08-15 | Payer: Medicare Other | Source: Ambulatory Visit

## 2020-09-08 NOTE — Progress Notes (Signed)
HPI female former smoker followed for COPD, chronic respiratory failure with hypoxia, OSA/failed CPAP, history lung CA/XRT/chemotherapy, DVT, complicated by AFib, chronic diastolic CHF  1017- Had CT scan of chest 05/06/2011 to followup of her aortic aneurysm. Showed emphysema, stable aneurysm, enlarged right lobe of thyroid, coronary disease. 2013-Cardiology has told her "heart is fine'. Dr Jana Hakim has told her no recurrence of lung Ca Office spirometry 11/28/2014-moderate restriction of exhaled volume and moderate obstruction. FVC 1.75/69%, FEV1 1.26/66%, FEV1/FVC 0.72, FEF 25-75 percent 0.77/47%. Echo 09/17/16-EF 35%-40%, PA pressure 49, IVC dilated consistent with elevated CVP, A. fib PFT 09/17/16-severe obstructive airways disease without response to dilator, severe restriction, severe reduction of diffusion. FVC 1.10/44%, FEV1 0.80/43%, ratio 0.72, TLC 67%, DLCO 25% CT chest HR 09/04/15-. There are findings in the lungs which could indicate interstitial lung disease, such is mild nonspecific interstitial pneumonia  (NSIP).  -----------------------------------------------------------------------------------------------   11/14/19- 80 year old female former smoker followed for COPD, chronic respiratory failure with hypoxia, ? ILD versus edema, OSA/failed CPAP, complicated by history lung CA/XRT/chemotherapy, DVT, AFib/ Xarelto, chronic diastolic CH,  aortic aneurysm, O2 2- 4 L/Adapt           95% on 3L on arrival          albut hfa, Stiolto -----SOB with activity, 3L of O2 at all times  (Patient Saturations on 3L walking from lobby to exam room = 77% 95% at rest on 4L) Patient Saturations on 4 Liters of oxygen while Ambulating = 95% Continues to need and benefit from O2, using 3-4 liters. Finds self-fill portable system difficult and hopes for POC- discussed.  Daughter(has amyloid) and grand daughter live at her house.  Stiolto works better than Spiriva but too expensive. Couldn't operate  Anoro. She might be able to afford Stiolto by [employer].  09/10/20- 80 year old female former smoker followed for COPD, chronic respiratory failure with hypoxia, ? ILD versus edema, OSA/failed CPAP, complicated by history NSSC lung CA/XRT/chemotherapy2004, DVT, AFib/ Xarelto, chronic diastolic CH,  Aortic Aneurysm, O2 2- 2-4 L/Adapt                  -albut hfa, Stiolto Body weight today-219 lbs Covid vax-3 Phizer Flu vax-had Arrival O2 sat 78% on her 3L pulse regulator. Corrected on continuous flow to 91% on 2L. Wants Spiriva instead of Stiolto. Feels stable. Discussed O2 and meds. Goes out very little. Ok at home on her concentrator CTa chest/ aorta 02/14/20- IMPRESSION: 1. Saccular aneurysm involving the descending thoracic aorta is stable. Saccular aneurysm itself measures up to 3.3 cm and stable. 2. Abdominal aortic stent graft is patent. Small aneurysm sac in the distal abdominal aorta is stable. No evidence for an endoleak. 3. Aortic Atherosclerosis (ICD10-I70.0) and Emphysema (ICD10-J43.9). 4. No acute abnormality in the chest, abdomen or pelvis. 5. Stable enlargement of the right ovary/adnexa. Minimal change since 2016. Lungs/Pleura: Trachea and mainstem bronchi are patent. Centrilobular emphysema. Stable focal nodularity along the right minor fissure on sequence 9, image 64 measures roughly 5 mm. No focal lung consolidation or airspace disease.  ROS-see HPI + = positive Constitutional:   No-   weight loss, night sweats, fevers, chills, fatigue, lassitude. HEENT:   No-  headaches, difficulty swallowing, tooth/dental problems, sore throat,       No-  sneezing, itching, ear ache, nasal congestion, post nasal drip,  CV:  No-   chest pain, orthopnea, PND,  +swelling in lower extremities, No-anasarca, dizziness,                     +  Occasional palpitations Resp:    +shortness of breath with exertion or at rest.              No- productive cough,  +non-productive cough,  no-coughing  up of blood.              No-   change in color of mucus.  No- wheezing.   Skin: No-   rash or lesions. GI:  No-   heartburn, indigestion, abdominal pain, nausea, vomiting,  GU: N MS:  No-   joint pain or swelling.  + Back pain Neuro-     nothing unusual Psych:  No- change in mood or affect. No depression or anxiety.  No memory loss.  OBJ- Physical Exam   General- Alert, Oriented, Affect-appropriate, Distress- none, + obese.,       O2 3L/ POC Skin- rash-none, lesions- none, excoriation- none Lymphadenopathy- none Head- atraumatic            Eyes- Gross vision intact, PERRLA, conjunctivae and secretions clear            Ears- Hearing, canals-normal            Nose- Clear, no-Septal dev, mucus, polyps, erosion, perforation             Throat- Mallampati III , mucosa clear , drainage- none, tonsils- atrophic, + dentures Neck- flexible , trachea midline, no stridor , thyroid nl, carotid no bruit Chest - symmetrical excursion , unlabored           Heart/CV- + RRR  no murmur heard , no gallop  , no rub, nl s1 s2                           - JVD- none ,  edema +2-3 chronic, + stasis changes, varices- none           Lung- clear, wheeze- none, cough- none , dullness-none, rub- none           Chest wall-  Abd-  Br/ Gen/ Rectal- Not done, not indicated Extrem- cyanosis- none, clubbing, none, atrophy- none, strength- nl,+ Stasis changes with heavy legs Neuro- grossly intact to observation

## 2020-09-10 ENCOUNTER — Ambulatory Visit (INDEPENDENT_AMBULATORY_CARE_PROVIDER_SITE_OTHER): Payer: Medicare Other | Admitting: Internal Medicine

## 2020-09-10 ENCOUNTER — Other Ambulatory Visit: Payer: Self-pay

## 2020-09-10 ENCOUNTER — Encounter: Payer: Self-pay | Admitting: Internal Medicine

## 2020-09-10 DIAGNOSIS — J449 Chronic obstructive pulmonary disease, unspecified: Secondary | ICD-10-CM | POA: Diagnosis not present

## 2020-09-10 DIAGNOSIS — J9611 Chronic respiratory failure with hypoxia: Secondary | ICD-10-CM | POA: Diagnosis not present

## 2020-09-10 MED ORDER — ALBUTEROL SULFATE HFA 108 (90 BASE) MCG/ACT IN AERS: INHALATION_SPRAY | RESPIRATORY_TRACT | 4 refills | Status: DC

## 2020-09-10 MED ORDER — SPIRIVA HANDIHALER 18 MCG IN CAPS
18.0000 ug | ORAL_CAPSULE | Freq: Every day | RESPIRATORY_TRACT | 4 refills | Status: DC
Start: 2020-09-10 — End: 2022-04-18

## 2020-09-10 NOTE — Patient Instructions (Signed)
Ventolin and Spiriva were ordered through [place] attention to your oxygen. We may need to get a portable system that provides continuous flow.  Please call if we can help

## 2020-10-03 ENCOUNTER — Other Ambulatory Visit: Payer: Self-pay | Admitting: Family Medicine

## 2020-10-03 ENCOUNTER — Ambulatory Visit
Admission: RE | Admit: 2020-10-03 | Discharge: 2020-10-03 | Disposition: A | Discharge status: Home / Self Care | Payer: Medicare Other | Source: Ambulatory Visit | Attending: Internal Medicine | Admitting: Internal Medicine

## 2020-10-03 ENCOUNTER — Other Ambulatory Visit: Payer: Self-pay

## 2020-10-03 DIAGNOSIS — Z1231 Encounter for screening mammogram for malignant neoplasm of breast: Secondary | ICD-10-CM | POA: Diagnosis not present

## 2020-10-24 ENCOUNTER — Telehealth: Payer: Self-pay | Admitting: Cardiology

## 2020-10-24 NOTE — Telephone Encounter (Signed)
Spoke with pt for lengthy period of time and pt has noted SOB for a long time but this has worsened over the last month Per pt has only been taking Furosemide 20 mg as needed pt not sure how many times a week,but according to pt has errands to do and will not take due to medicine causing pt to void quickly Per pt weight on home scale is 217.5 up from when pt was here in January weighing at 213. Encouraged pt to take Furosemide 80 mg today and on days that pt has  to run errands take during the day according to when can be home so if has errands in am take in the pm or vice versa.Pt verbalize understanding Will forward to Dr Martinique for review and recommendations./cy

## 2020-10-24 NOTE — Telephone Encounter (Signed)
Pt c/o Shortness Of Breath: STAT if SOB developed within the last 24 hours or pt is noticeably SOB on the phone  1. Are you currently SOB (can you hear that pt is SOB on the phone)? Yes  2. How long have you been experiencing SOB? About a month  3. Are you SOB when sitting or when up moving around? Moving    4. Are you currently experiencing any other symptoms? No Pain     Pt c/o swelling: STAT is pt has developed SOB within 24 hours  1) How much weight have you gained and in what time span? Not sure  2) If swelling, where is the swelling located? Feet ankles  3) Are you currently taking a fluid pill? Yes  4) Are you currently SOB? Yes   5) Do you have a log of your daily weights (if so, list)? No   6) Have you gained 3 pounds in a day or 5 pounds in a week? yes  7) Have you traveled recently? No

## 2020-10-25 DIAGNOSIS — N390 Urinary tract infection, site not specified: Secondary | ICD-10-CM | POA: Diagnosis not present

## 2020-10-25 NOTE — Telephone Encounter (Signed)
Agree  Candice Tobey MD, FACC   

## 2020-10-25 NOTE — Telephone Encounter (Signed)
Called patient left message on personal voice mail Dr.Jordan agreed.

## 2020-11-05 DIAGNOSIS — G894 Chronic pain syndrome: Secondary | ICD-10-CM | POA: Diagnosis not present

## 2020-11-05 DIAGNOSIS — Z9981 Dependence on supplemental oxygen: Secondary | ICD-10-CM | POA: Diagnosis not present

## 2020-11-05 DIAGNOSIS — Z79891 Long term (current) use of opiate analgesic: Secondary | ICD-10-CM | POA: Diagnosis not present

## 2020-11-05 DIAGNOSIS — M25512 Pain in left shoulder: Secondary | ICD-10-CM | POA: Diagnosis not present

## 2020-11-07 ENCOUNTER — Other Ambulatory Visit: Payer: Self-pay | Admitting: Cardiology

## 2020-11-18 ENCOUNTER — Emergency Department (HOSPITAL_COMMUNITY): Payer: Medicare Other

## 2020-11-18 ENCOUNTER — Other Ambulatory Visit: Payer: Self-pay

## 2020-11-18 ENCOUNTER — Emergency Department (HOSPITAL_COMMUNITY)
Admission: EM | Admit: 2020-11-18 | Discharge: 2020-11-18 | Disposition: A | Discharge status: Home / Self Care | Payer: Medicare Other | Attending: Emergency Medicine | Admitting: Emergency Medicine

## 2020-11-18 ENCOUNTER — Encounter (HOSPITAL_COMMUNITY): Payer: Self-pay | Admitting: Emergency Medicine

## 2020-11-18 DIAGNOSIS — M79671 Pain in right foot: Secondary | ICD-10-CM | POA: Diagnosis present

## 2020-11-18 DIAGNOSIS — Z96652 Presence of left artificial knee joint: Secondary | ICD-10-CM | POA: Diagnosis not present

## 2020-11-18 DIAGNOSIS — L02611 Cutaneous abscess of right foot: Secondary | ICD-10-CM | POA: Insufficient documentation

## 2020-11-18 DIAGNOSIS — J449 Chronic obstructive pulmonary disease, unspecified: Secondary | ICD-10-CM | POA: Insufficient documentation

## 2020-11-18 DIAGNOSIS — M7731 Calcaneal spur, right foot: Secondary | ICD-10-CM | POA: Diagnosis not present

## 2020-11-18 DIAGNOSIS — Z85118 Personal history of other malignant neoplasm of bronchus and lung: Secondary | ICD-10-CM | POA: Diagnosis not present

## 2020-11-18 DIAGNOSIS — E039 Hypothyroidism, unspecified: Secondary | ICD-10-CM | POA: Insufficient documentation

## 2020-11-18 DIAGNOSIS — R609 Edema, unspecified: Secondary | ICD-10-CM

## 2020-11-18 DIAGNOSIS — I4891 Unspecified atrial fibrillation: Secondary | ICD-10-CM | POA: Insufficient documentation

## 2020-11-18 DIAGNOSIS — I110 Hypertensive heart disease with heart failure: Secondary | ICD-10-CM | POA: Diagnosis not present

## 2020-11-18 DIAGNOSIS — Z7901 Long term (current) use of anticoagulants: Secondary | ICD-10-CM | POA: Diagnosis not present

## 2020-11-18 DIAGNOSIS — Z79899 Other long term (current) drug therapy: Secondary | ICD-10-CM | POA: Diagnosis not present

## 2020-11-18 DIAGNOSIS — R600 Localized edema: Secondary | ICD-10-CM | POA: Diagnosis not present

## 2020-11-18 DIAGNOSIS — M7989 Other specified soft tissue disorders: Secondary | ICD-10-CM | POA: Diagnosis not present

## 2020-11-18 DIAGNOSIS — M19071 Primary osteoarthritis, right ankle and foot: Secondary | ICD-10-CM | POA: Diagnosis not present

## 2020-11-18 DIAGNOSIS — I5043 Acute on chronic combined systolic (congestive) and diastolic (congestive) heart failure: Secondary | ICD-10-CM | POA: Insufficient documentation

## 2020-11-18 DIAGNOSIS — L03115 Cellulitis of right lower limb: Secondary | ICD-10-CM | POA: Diagnosis not present

## 2020-11-18 DIAGNOSIS — R0602 Shortness of breath: Secondary | ICD-10-CM | POA: Diagnosis not present

## 2020-11-18 DIAGNOSIS — R52 Pain, unspecified: Secondary | ICD-10-CM

## 2020-11-18 DIAGNOSIS — Z87891 Personal history of nicotine dependence: Secondary | ICD-10-CM | POA: Diagnosis not present

## 2020-11-18 DIAGNOSIS — L03031 Cellulitis of right toe: Secondary | ICD-10-CM

## 2020-11-18 LAB — CBC WITH DIFFERENTIAL/PLATELET
Abs Immature Granulocytes: 0.01 10*3/uL (ref 0.00–0.07)
Basophils Absolute: 0 10*3/uL (ref 0.0–0.1)
Basophils Relative: 1 %
Eosinophils Absolute: 0.1 10*3/uL (ref 0.0–0.5)
Eosinophils Relative: 1 %
HCT: 50.3 % — ABNORMAL HIGH (ref 36.0–46.0)
Hemoglobin: 16.4 g/dL — ABNORMAL HIGH (ref 12.0–15.0)
Immature Granulocytes: 0 %
Lymphocytes Relative: 33 %
Lymphs Abs: 1.8 10*3/uL (ref 0.7–4.0)
MCH: 34.5 pg — ABNORMAL HIGH (ref 26.0–34.0)
MCHC: 32.6 g/dL (ref 30.0–36.0)
MCV: 105.7 fL — ABNORMAL HIGH (ref 80.0–100.0)
Monocytes Absolute: 0.4 10*3/uL (ref 0.1–1.0)
Monocytes Relative: 8 %
Neutro Abs: 3 10*3/uL (ref 1.7–7.7)
Neutrophils Relative %: 57 %
Platelets: 129 10*3/uL — ABNORMAL LOW (ref 150–400)
RBC: 4.76 MIL/uL (ref 3.87–5.11)
RDW: 13.6 % (ref 11.5–15.5)
WBC: 5.3 10*3/uL (ref 4.0–10.5)
nRBC: 0 % (ref 0.0–0.2)

## 2020-11-18 LAB — COMPREHENSIVE METABOLIC PANEL
ALT: 18 U/L (ref 0–44)
AST: 21 U/L (ref 15–41)
Albumin: 3.3 g/dL — ABNORMAL LOW (ref 3.5–5.0)
Alkaline Phosphatase: 52 U/L (ref 38–126)
Anion gap: 6 (ref 5–15)
BUN: 13 mg/dL (ref 8–23)
CO2: 26 mmol/L (ref 22–32)
Calcium: 9 mg/dL (ref 8.9–10.3)
Chloride: 105 mmol/L (ref 98–111)
Creatinine, Ser: 0.9 mg/dL (ref 0.44–1.00)
GFR, Estimated: >60 mL/min (ref >60)
Glucose, Bld: 132 mg/dL — ABNORMAL HIGH (ref 70–99)
Potassium: 4 mmol/L (ref 3.5–5.1)
Sodium: 137 mmol/L (ref 135–145)
Total Bilirubin: 1.5 mg/dL — ABNORMAL HIGH (ref 0.3–1.2)
Total Protein: 6.4 g/dL — ABNORMAL LOW (ref 6.5–8.1)

## 2020-11-18 LAB — BRAIN NATRIURETIC PEPTIDE: B Natriuretic Peptide: 455.2 pg/mL — ABNORMAL HIGH (ref 0.0–100.0)

## 2020-11-18 LAB — LACTIC ACID, PLASMA: Lactic Acid, Venous: 1.4 mmol/L (ref 0.5–1.9)

## 2020-11-18 MED ORDER — METOPROLOL TARTRATE 5 MG/5ML IV SOLN
5.0000 mg | Freq: Once | INTRAVENOUS | Status: AC
  Administered 2020-11-18: 5 mg via INTRAVENOUS
  Filled 2020-11-18: qty 5

## 2020-11-18 MED ORDER — CEFAZOLIN SODIUM-DEXTROSE 2-4 GM/100ML-% IV SOLN
2.0000 g | Freq: Once | INTRAVENOUS | Status: AC
  Administered 2020-11-18: 2 g via INTRAVENOUS
  Filled 2020-11-18: qty 100

## 2020-11-18 MED ORDER — CEPHALEXIN 500 MG PO CAPS: 500.0000 mg | ORAL_CAPSULE | Freq: Four times a day (QID) | ORAL | 0 refills | Status: DC

## 2020-11-18 NOTE — Discharge Instructions (Signed)
You were seen in the emergency department for evaluation of wounds on your right foot that are not healing.  Your blood work and x-ray did not show any serious abnormalities.  You were given an IV dose of antibiotics and a prescription was sent to the pharmacy for some oral antibiotics.  Please try to keep your legs elevated.  Continue your regular medications.  Contact your primary care doctor on Tuesday for close follow-up.  Return to the emergency department if any high fevers or worsening symptoms

## 2020-11-18 NOTE — ED Provider Notes (Signed)
[facility] EMERGENCY DEPARTMENT Provider Note   CSN: [id number] Arrival date & time: 11/18/20  1720     History No chief complaint on file.   [name] is a 80 y.o. female.  She has a history of A. fib and flutter, COPD, CHF, and is on anticoagulation.  Also has history of DVT and chronic peripheral edema.  Is on 3 or 4 L of oxygen at home.  Complaining of some leaking wounds on her right ankle and foot for 4 to 5 days.  No known trauma.  Has been trying some topical antibiotics without improvement.  No known fever but is feeling some fatigue.  Decreased appetite.  The history is provided by the patient.  Foot Pain This is a new problem. Episode onset: 4-5 days. The problem occurs constantly. The problem has not changed since onset.Associated symptoms include shortness of breath (baseline). Pertinent negatives include no chest pain, no abdominal pain and no headaches. The symptoms are aggravated by walking. Nothing relieves the symptoms. She has tried nothing for the symptoms. The treatment provided no relief.       Past Medical History:  Diagnosis Date  . AAA (abdominal aortic aneurysm) ([place])   . Anticoagulant long-term use    Failed on Coumadin. On Xarelto  . Anxiety   . Anxiety and depression   . Atrial fib/flutter, transient June 2012  . Chronic lower back pain   . COPD (chronic obstructive pulmonary disease) ([place])   . DVT (deep venous thrombosis) ([place]) 2004   BLE  . Esophageal dysmotility   . Exertional dyspnea   . Hiatal hernia   . History of bronchitis   . History of fibrocystic disease of breast   . History of uterine fibroid   . HTN (hypertension)   . Hyperlipidemia   . Hypothyroidism   . Normal nuclear stress test 2012   May 2012  . OA (osteoarthritis)    "knees; left shoulder"  . OSA (obstructive sleep apnea)    "haven't been using my CPAP lately" (01/21/12)  . Ovarian mass    right benign  . Ovarian mass    benign, right  . PAD  (peripheral artery disease) ([place])   . Small cell carcinoma of lung ([place]) 2004   NON-SMALL CELL CARCINOMA OF THE LUNG, METASTATIC TO THE SUPRACLAVICULAR AND MEDIASTINAL LYMPH NODES; in remission    Patient Active Problem List   Diagnosis Date Noted  . Physical deconditioning 06/16/2019  . Hypokalemia   . Acute on chronic combined systolic and diastolic CHF (congestive heart failure) ([place]) 09/15/2016  . Hyperlipidemia   . Chronic respiratory failure with hypoxia ([place]) 12/03/2014  . Insomnia, chronic 01/27/2014  . A-fib ([place])   . Multiple thyroid nodules 10/17/2012  . AAA (abdominal aortic aneurysm) ([place]) 05/11/2012  . Bradycardia 01/23/2012  . Hypothyroid 01/21/2012  . HTN (hypertension) 01/21/2012  . Edema 11/19/2011  . Atrial flutter ([place]) 11/20/2010  . Obstructive sleep apnea 10/16/2008  . DVT 08/19/2008  . COPD mixed type ([place]) 08/19/2008  . NEOPLASM, MALIGNANT, LUNG, HX OF 08/19/2008  . Diastolic heart failure, NYHA class 2 ([place]) 08/03/2008  . Malignant neoplasm of lung ([place]) 06/23/2002    Past Surgical History:  Procedure Laterality Date  . ABDOMINAL AORTIC ANEURYSM REPAIR  ~ 2010   stent graft  . BLADDER SURGERY  ~ 2003   sling  . BREAST BIOPSY  1960's   both breast's - benign  . CARDIOVASCULAR STRESS TEST  2012  No ischemia  . CATARACT EXTRACTION W/ INTRAOCULAR LENS  IMPLANT, BILATERAL Bilateral ~ 2009  . REPLACEMENT TOTAL KNEE Left ~ 2008   left  . THYROIDECTOMY  ~ 1964     OB History   No obstetric history on file.     Family History  Problem Relation Age of Onset  . Heart disease Father   . Heart attack Father   . Diabetes type II Mother   . Diabetes Mother   . Hypertension Mother   . Heart disease Brother        before age 20  . Heart disease Sister        See's Dr. Acie Fredrickson  . Breast cancer Neg Hx     Social History   Tobacco Use  . Smoking status: Former Smoker    Packs/day: 1.00    Years: 40.00    Pack years: 40.00    Types:  Cigarettes    Quit date: 06/23/2001    Years since quitting: 19.4  . Smokeless tobacco: Never Used  Vaping Use  . Vaping Use: Never used  Substance Use Topics  . Alcohol use: Yes    Alcohol/week: 0.0 standard drinks    Comment: once a week (glass of wine)  . Drug use: No    Home Medications Prior to Admission medications   Medication Sig Start Date End Date Taking? Authorizing Provider  albuterol (VENTOLIN HFA) 108 (90 Base) MCG/ACT inhaler TAKE 2 PUFFS EVERY 6 HOURS AS NEEDED FORSHORTNESS OF BREATH/WHEEZING 09/10/20   Baird Lyons D, MD  apixaban (ELIQUIS) 5 MG TABS tablet Take 1 tablet (5 mg total) by mouth 2 (two) times daily. 06/27/20   Martinique, Peter M, MD  CALCIUM-MAGNESIUM-ZINC PO Take 1 tablet by mouth daily.     [provider]  Cholecalciferol (VITAMIN D) 1000 UNITS capsule Take 1,000 Units by mouth daily.    [provider]  citalopram (CELEXA) 20 MG tablet Take 20 mg by mouth daily.    [provider]  digoxin (LANOXIN) 0.125 MG tablet TAKE 1 TABLET (125 MCG TOTAL) BY MOUTH DAILY. 01/10/20   Martinique, Peter M, MD  furosemide (LASIX) 40 MG tablet Take 2 tablets (80 mg total) by mouth daily. NEED OV. 06/27/20   Martinique, Peter M, MD  gabapentin (NEURONTIN) 100 MG capsule Take 1 capsule by mouth 3 (three) times daily.    [provider]  levothyroxine (SYNTHROID, LEVOTHROID) 88 MCG tablet Take 88 mcg by mouth daily.    [provider]  Lidocaine (HM LIDOCAINE PATCH) 4 % PTCH Apply 1 patch topically as needed. Patient not taking: Reported on 09/10/2020 11/30/19   Faustino Congress, NP  megestrol (MEGACE) 40 MG tablet Take 40 mg by mouth daily.     [provider]  metolazone (ZAROXOLYN) 2.5 MG tablet Take 1 tablet (2.5 mg total) by mouth daily as needed (ONE DAILY PRN WEIGHT GAIN). USE AS NEEDED FOR WEIGHT GAIN 09/06/19   Deberah Pelton, NP  metoprolol tartrate (LOPRESSOR) 25 MG tablet TAKE 1/2 TABLET TWICE DAILY 11/08/20   Martinique, Peter  M, MD  morphine (MSIR) 15 MG tablet Take 5 mg by mouth as needed. 10/13/16   [provider]  mupirocin ointment (BACTROBAN) 2 % mupirocin 2 % topical ointment  APPLY TO WOUNDS ON SKIN ONCE DAILY AT BANDAGE CHANGES    [provider]  OXYGEN Inhale 4 L into the lungs daily. Patient states she wears 2L at night and 4L when up and moving  [provider]  potassium chloride (KLOR-CON) 10 MEQ tablet TAKE 1 TABLET (10 MEQ TOTAL) BY MOUTH 2 (TWO) TIMES DAILY. 04/16/20   Martinique, Peter M, MD  temazepam (RESTORIL) 15 MG capsule TAKE 1 CAPSULE AT BEDTIME AS NEEDED  FOR  SLEEP 07/16/16   Baird Lyons D, MD  tiotropium (SPIRIVA HANDIHALER) 18 MCG inhalation capsule Place 1 capsule (18 mcg total) into inhaler and inhale daily. 09/10/20   Deneise Lever, MD  tiZANidine (ZANAFLEX) 2 MG tablet Take 1 tablet (2 mg total) by mouth every 6 (six) hours as needed for muscle spasms. 11/30/19   Faustino Congress, NP  vitamin B-12 (CYANOCOBALAMIN) 100 MCG tablet Take 100 mcg by mouth daily.    [provider]    Allergies    Patient has no known allergies.  Review of Systems   Review of Systems  Constitutional: Positive for fatigue. Negative for fever.  HENT: Negative for sore throat.   Eyes: Negative for visual disturbance.  Respiratory: Positive for shortness of breath (baseline).   Cardiovascular: Positive for leg swelling. Negative for chest pain.  Gastrointestinal: Negative for abdominal pain.  Genitourinary: Negative for dysuria.  Musculoskeletal: Positive for gait problem.  Skin: Positive for wound. Negative for rash.  Neurological: Negative for headaches.    Physical Exam Updated Vital Signs BP (!) 126/114 (BP Location: Left Arm)   Pulse (!) 129   Temp 98.3 F (36.8 C)   Resp (!) 22   SpO2 97%   Physical Exam Vitals and nursing note reviewed.  Constitutional:      General: She is not in acute distress.    Appearance: She is well-developed.  HENT:      Head: Normocephalic and atraumatic.  Eyes:     Conjunctiva/sclera: Conjunctivae normal.  Cardiovascular:     Rate and Rhythm: Tachycardia present. Rhythm irregular.     Pulses: Normal pulses.     Heart sounds: No murmur heard.   Pulmonary:     Effort: Tachypnea and accessory muscle usage present. No respiratory distress.     Breath sounds: Normal breath sounds. No wheezing.  Abdominal:     Palpations: Abdomen is soft.     Tenderness: There is no abdominal tenderness.  Musculoskeletal:        General: Tenderness present.     Cervical back: Neck supple.     Right lower leg: Edema present.     Left lower leg: Edema present.     Comments: She has a superficial ulceration at the crease between her ankle and her foot.  There is also another ulceration just before her toes.  There are some yellow crust.  These are shallow.  The foot is warm and slightly red compared to the other side although not significantly more edematous.  Skin:    General: Skin is warm and dry.  Neurological:     General: No focal deficit present.     Mental Status: She is alert and oriented to person, place, and time.       ED Results / Procedures / Treatments   Labs (all labs ordered are listed, but only abnormal results are displayed) Labs Reviewed  CBC WITH DIFFERENTIAL/PLATELET - Abnormal; Notable for the following components:      Result Value   Hemoglobin 16.4 (*)    HCT 50.3 (*)    MCV 105.7 (*)    MCH 34.5 (*)    Platelets 129 (*)    All other components within normal limits  COMPREHENSIVE  METABOLIC PANEL - Abnormal; Notable for the following components:   Glucose, Bld 132 (*)    Total Protein 6.4 (*)    Albumin 3.3 (*)    Total Bilirubin 1.5 (*)    All other components within normal limits  BRAIN NATRIURETIC PEPTIDE - Abnormal; Notable for the following components:   B Natriuretic Peptide 455.2 (*)    All other components within normal limits  CULTURE, BLOOD (ROUTINE X 2)  CULTURE, BLOOD  (ROUTINE X 2)  LACTIC ACID, PLASMA  DIGOXIN LEVEL    EKG EKG Interpretation  Date/Time:  Sunday Nov 18 2020 17:51:46 EDT Ventricular Rate:  122 PR Interval:    QRS Duration: 94 QT Interval:  378 QTC Calculation: 538 R Axis:   -65 Text Interpretation: Atrial fibrillation with rapid ventricular response with premature ventricular or aberrantly conducted complexes Left anterior fascicular block Anterior infarct , age undetermined Abnormal ECG No significant change since prior 3/18 Confirmed by Aletta Edouard [phone]) on 11/18/2020 6:30:06 PM   Radiology DG Chest 1 View  Result Date: 11/18/2020 CLINICAL DATA:  Shortness of breath EXAM: CHEST  1 VIEW COMPARISON:  11/30/2019 FINDINGS: Cardiac shadow is enlarged but stable. Aortic calcifications are noted. Mild patient rotation accentuates the mediastinal markings. The lungs are well aerated bilaterally with mild chronic interstitial changes. No focal infiltrate or effusion is seen. No acute bony abnormality is noted. IMPRESSION: No acute abnormality seen. Electronically Signed   By: Inez Catalina M.D.   On: 11/18/2020 19:46   DG Ankle Complete Right  Result Date: 11/18/2020 CLINICAL DATA:  Ankle pain for several days, no known injury, initial encounter EXAM: RIGHT ANKLE - COMPLETE 3+ VIEW COMPARISON:  None. FINDINGS: Generalized soft tissue swelling is noted about the ankle. Calcaneal spurring is noted. No acute fracture or dislocation is seen. IMPRESSION: Soft tissue swelling without acute bony abnormality. Electronically Signed   By: Inez Catalina M.D.   On: 11/18/2020 19:47   DG Foot Complete Right  Result Date: 11/18/2020 CLINICAL DATA:  Right foot pain for several days, no known injury, initial encounter EXAM: RIGHT FOOT COMPLETE - 3+ VIEW COMPARISON:  None. FINDINGS: Generalized soft tissue swelling is noted about the ankle. Degenerative changes of the first MTP joint are seen. No acute fracture or dislocation is noted. Small calcaneal spur  is noted. IMPRESSION: Soft tissue swelling without acute bony abnormality. Electronically Signed   By: Inez Catalina M.D.   On: 11/18/2020 19:48    Procedures Procedures   Medications Ordered in ED Medications  metoprolol tartrate (LOPRESSOR) injection 5 mg (5 mg Intravenous Given 11/18/20 2005)  ceFAZolin (ANCEF) IVPB 2g/100 mL premix (0 g Intravenous Stopped 11/18/20 2100)    ED Course  I have reviewed the triage vital signs and the nursing notes.  Pertinent labs & imaging results that were available during my care of the patient were reviewed by me and considered in my medical decision making (see chart for details).  Clinical Course as of 11/19/20 6712  Sun Nov 18, 2020  1933 Patient is tachycardic and tachypneic.  She has a history of A. fib.  Afebrile and normal white count.  Lactate normal.  Do not think this physiology represents sepsis.  History of CHF so I am going to hold off on fluid bolus.  I have ordered some rate control. [MB]  2017 Labs are fairly reassuring.  She received some Lopressor with improvement in her heart rate.  As she has not failed antibiotics yet for this wound  infection possible early cellulitis we will give her an IV dose of some Ancef and send her home on Keflex. [MB]  2024 BNP is elevated although not significantly from her priors.  Chest x-ray not showing significant edema. [MB]  2051 Patient received her IV antibiotics.  She is comfortable plan for discharge and understands to follow-up with her PCP.  Return instructions discussed [MB]    Clinical Course User Index [MB] Hayden Rasmussen, MD   MDM Rules/Calculators/A&P                         This patient complains of nonhealing sores on her right foot and ankle; this involves an extensive number of treatment Options and is a complaint that carries with it a high risk of complications and Morbidity. The differential includes cellulitis, peripheral vascular disease, peripheral edema, osteomyelitis, less  likely DVT.  Patient is also mildly short of breath although she says she is at her baseline.  Differential includes A. fib with RVR, CHF, COPD, pneumonia, pneumothorax  I ordered, reviewed and interpreted labs, which included CBC with normal white count, hemoglobin slightly greater than her baseline, chemistries fairly normal, lactate unremarkable, BNP elevated from her priors but not significantly so.  Blood cultures pending at time of discharge. I ordered medication IV Lopressor for patient's tachycardia with improvement in her heart rate.  IV antibiotics. I ordered imaging studies which included chest x-ray and right foot and ankle and I independently    visualized and interpreted imaging which showed no acute findings other than some soft tissue swelling Additional history obtained from patient's sister Previous records obtained and reviewed in epic, no recent admissions  After the interventions stated above, I reevaluated the patient and found patient's heart rate and breathing to be improved.  We discussed admission to the hospital for IV antibiotics versus trial of home antibiotics.  Patient would rather go home.  I think this is reasonable at this time as she is not failed outpatient treatment.  Recommended close follow-up with her PCP and return instructions discussed. CHA2DS2/VAS Stroke Risk Points  Current as of 15 minutes ago     6 >= 2 Points: High Risk  1 - 1.99 Points: Medium Risk  0 Points: Low Risk    No Change      Details    This score determines the patient's risk of having a stroke if the  patient has atrial fibrillation.       Points Metrics  1 Has Congestive Heart Failure:  Yes    Current as of 15 minutes ago  1 Has Vascular Disease:  Yes     Current as of 15 minutes ago  1 Has Hypertension:  Yes    Current as of 15 minutes ago  2 Age:  17    Current as of 15 minutes ago  0 Has Diabetes:  No    Current as of 15 minutes ago  0 Had Stroke:  No  Had TIA:  No  Had  Thromboembolism:  No    Current as of 15 minutes ago  1 Female:  Yes    Current as of 15 minutes ago            Final Clinical Impression(s) / ED Diagnoses Final diagnoses:  Cellulitis and abscess of toe of right foot  Peripheral edema  Atrial fibrillation with RVR ([place])    Rx / DC Orders ED Discharge Orders  Ordered    cephALEXin (KEFLEX) 500 MG capsule  4 times daily        11/18/20 2020           Hayden Rasmussen, MD 11/19/20 [phone]

## 2020-11-18 NOTE — ED Provider Notes (Signed)
Emergency Medicine Provider Triage Evaluation Note  [name] , a 80 y.o. female  was evaluated in triage.  Pt complains of wound to the top of her foot x4 days.  No known falls or injuries.  Noticed it several days ago.  Has pain with movement, however she states if she is ambulating she has no pain.  No fevers or chills.  No chest pain, no shortness of breath, patient with a history of emphysema, normally on 4 to 5 L of supplemental O2, states she has short of breath more than her baseline  Review of Systems  Positive: As above Negative: As above  Physical Exam  There were no vitals taken for this visit. Gen:   Awake, no distress   Resp:  Normal effort  MSK:   Moves extremities without difficulty Other:  Wound to the superficial dorsal aspect of the right foot in the right ankle.  No surrounding erythema, drainage.  Medical Decision Making  Medically screening exam initiated at 5:40 PM.  Appropriate orders placed.  [name] was informed that the remainder of the evaluation will be completed by another provider, this initial triage assessment does not replace that evaluation, and the importance of remaining in the ED until their evaluation is complete.  Patient noted to be in A. fib with RVR, with a rate between 110-130.  Patient has a history of A. fib, is on Eliquis.  Nursing staff informed to expedite triage.   Garald Balding, PA-C 11/18/20 1744    Hayden Rasmussen, MD 11/19/20 [phone]

## 2020-11-18 NOTE — ED Notes (Signed)
Patient given discharge paperwork and instructions. Verbalized understanding of teaching. IV d/c with cath tip intact. Wheeled to exit in NAD.

## 2020-11-18 NOTE — ED Triage Notes (Addendum)
C/o wound to R foot/ankle x 4 days.  States she thought it was blisters from sitting close to a heater.  Denies fever and chills.  Wears 3-4 liters O2 at home.  Denies increased SOB.  Pt in Afib RVR.

## 2020-11-19 ENCOUNTER — Telehealth (HOSPITAL_COMMUNITY): Payer: Self-pay

## 2020-11-19 LAB — DIGOXIN LEVEL

## 2020-11-23 LAB — CULTURE, BLOOD (ROUTINE X 2)
Culture: NO GROWTH
Culture: NO GROWTH
Special Requests: ADEQUATE
Special Requests: ADEQUATE

## 2020-12-12 DIAGNOSIS — Z9981 Dependence on supplemental oxygen: Secondary | ICD-10-CM | POA: Diagnosis not present

## 2020-12-12 DIAGNOSIS — C3490 Malignant neoplasm of unspecified part of unspecified bronchus or lung: Secondary | ICD-10-CM | POA: Diagnosis not present

## 2020-12-12 DIAGNOSIS — I480 Paroxysmal atrial fibrillation: Secondary | ICD-10-CM | POA: Diagnosis not present

## 2020-12-12 DIAGNOSIS — F329 Major depressive disorder, single episode, unspecified: Secondary | ICD-10-CM | POA: Diagnosis not present

## 2020-12-12 DIAGNOSIS — I714 Abdominal aortic aneurysm, without rupture: Secondary | ICD-10-CM | POA: Diagnosis not present

## 2020-12-12 DIAGNOSIS — J449 Chronic obstructive pulmonary disease, unspecified: Secondary | ICD-10-CM | POA: Diagnosis not present

## 2020-12-12 DIAGNOSIS — Z111 Encounter for screening for respiratory tuberculosis: Secondary | ICD-10-CM | POA: Diagnosis not present

## 2020-12-12 DIAGNOSIS — I739 Peripheral vascular disease, unspecified: Secondary | ICD-10-CM | POA: Diagnosis not present

## 2020-12-12 DIAGNOSIS — I10 Essential (primary) hypertension: Secondary | ICD-10-CM | POA: Diagnosis not present

## 2020-12-12 DIAGNOSIS — Z7901 Long term (current) use of anticoagulants: Secondary | ICD-10-CM | POA: Diagnosis not present

## 2020-12-12 DIAGNOSIS — I509 Heart failure, unspecified: Secondary | ICD-10-CM | POA: Diagnosis not present

## 2020-12-19 DIAGNOSIS — I10 Essential (primary) hypertension: Secondary | ICD-10-CM | POA: Diagnosis not present

## 2020-12-19 DIAGNOSIS — J449 Chronic obstructive pulmonary disease, unspecified: Secondary | ICD-10-CM | POA: Diagnosis not present

## 2020-12-19 DIAGNOSIS — I509 Heart failure, unspecified: Secondary | ICD-10-CM | POA: Diagnosis not present

## 2020-12-19 DIAGNOSIS — Z9981 Dependence on supplemental oxygen: Secondary | ICD-10-CM | POA: Diagnosis not present

## 2020-12-19 DIAGNOSIS — N950 Postmenopausal bleeding: Secondary | ICD-10-CM | POA: Diagnosis not present

## 2020-12-19 DIAGNOSIS — I480 Paroxysmal atrial fibrillation: Secondary | ICD-10-CM | POA: Diagnosis not present

## 2020-12-21 ENCOUNTER — Telehealth: Payer: Self-pay | Admitting: General Practice

## 2020-12-21 ENCOUNTER — Telehealth: Payer: Self-pay | Admitting: Cardiology

## 2020-12-21 MED ORDER — FUROSEMIDE 40 MG PO TABS: ORAL_TABLET | ORAL | 3 refills | Status: DC

## 2020-12-21 MED ORDER — DIGOXIN 125 MCG PO TABS: 125.0000 ug | ORAL_TABLET | Freq: Every day | ORAL | 3 refills | Status: DC

## 2020-12-21 MED ORDER — POTASSIUM CHLORIDE ER 10 MEQ PO TBCR: 10.0000 meq | EXTENDED_RELEASE_TABLET | Freq: Two times a day (BID) | ORAL | 3 refills | Status: DC

## 2020-12-21 NOTE — Telephone Encounter (Signed)
Digoxin prescription faxed to [place] at [facility] assisted living at fax # [phone].

## 2020-12-21 NOTE — Telephone Encounter (Signed)
Spoke to [place] patient is taking Lasix 40 mg 2 tablets daily.Takes Potassium 10 meq twice a day.Prescriptions sent to Lattie Haw at [facility] assisted living at fax # [phone].

## 2020-12-21 NOTE — Telephone Encounter (Signed)
When I last saw her in January we did not have lasix listed and we had her on total of 20 meq potassium daily. Potassium level 5/29 was normal  Eliseo Withers Martinique MD, [facility]

## 2020-12-21 NOTE — Telephone Encounter (Signed)
Returned call to [place] moved in to [facility] yesterday PCP didn't write digoxin on her daily meds but patient has the medication bottle.  She wanted to confirm she was suppose to be taking this and get a prescription.   Advised patient is on digoxin 0.125 mg daily.  Advised Dr. Martinique in office this PM, will route to nurse to get prescription signed.     She request rx faxed to: # [phone]

## 2020-12-21 NOTE — Telephone Encounter (Signed)
Pt c/o medication issue:  1. Name of Medication:  furosemide (LASIX) 40 MG tablet potassium chloride (KLOR-CON) 10 MEQ tablet  2. How are you currently taking this medication (dosage and times per day)?   3. Are you having a reaction (difficulty breathing--STAT)?   4. What is your medication issue? Lattie Haw, Nurse from [facility] needs written orders for how the patient is to take this medication. The patient just moved into the facility and brought medications with different sets of instructions.   Please fax orders to  [phone] Attn: Lattie Haw or Jonelle Sidle

## 2020-12-21 NOTE — Telephone Encounter (Signed)
Returned call to [employer] adjuster, chartered at [facility] living). Per Lattie Haw, patient was just admitted to facility yesterday. Per Lattie Haw, patient has order for Potassium on her medication list there which is Potassium 10MEQ twice daily, and per the patient's PCP patient to take Potassium 10MEQ (2) Tablets Twice daily. Lattie Haw states she is unsure what dose patient should be on. Lattie Haw also states that patient has no order for Lasix- and is not on medication list from patients PCP. Lattie Haw states that patient's legs are very swollen, but states that patient reports they are at baseline. Lattie Haw also states she is unsure if patient was taking medications as prescribed due to multiple full pill bottles of medications. Lattie Haw states she would like to know if patient should be on potassium and lasix, and if so what dosages. Lattie Haw states that she will needs prescriptions faxed to facility. Lattie Haw also states that she would like to know before end of day due to weekend.   Guy Franco that I would forward message to Dr. Martinique and his nurse to review and advise. Lattie Haw verbalized understanding.   Patient also due for 6 month follow up with Dr. Martinique, advised Lattie Haw to have patient's daughter reach out to get that scheduled. Lattie Haw verbalized understanding.

## 2020-12-21 NOTE — Telephone Encounter (Signed)
Tiffany from [facility] is calling in reference to pt taking the medicine Digoxin. Tiffany states that pt is moving into this facility and would like to know if pt is still currently supposed to  be taking this medicine and if so, she will need a new script. Please advise Tiffany further [phone] is Tiffany's direct office #

## 2020-12-26 ENCOUNTER — Ambulatory Visit: Payer: Medicare Other | Admitting: General Practice

## 2020-12-27 DIAGNOSIS — Z8701 Personal history of pneumonia (recurrent): Secondary | ICD-10-CM | POA: Diagnosis not present

## 2020-12-27 DIAGNOSIS — I4891 Unspecified atrial fibrillation: Secondary | ICD-10-CM | POA: Diagnosis not present

## 2020-12-27 DIAGNOSIS — M19012 Primary osteoarthritis, left shoulder: Secondary | ICD-10-CM | POA: Diagnosis not present

## 2020-12-27 DIAGNOSIS — J9611 Chronic respiratory failure with hypoxia: Secondary | ICD-10-CM | POA: Diagnosis not present

## 2020-12-27 DIAGNOSIS — M1711 Unilateral primary osteoarthritis, right knee: Secondary | ICD-10-CM | POA: Diagnosis not present

## 2020-12-27 DIAGNOSIS — I4892 Unspecified atrial flutter: Secondary | ICD-10-CM | POA: Diagnosis not present

## 2020-12-27 DIAGNOSIS — G8929 Other chronic pain: Secondary | ICD-10-CM | POA: Diagnosis not present

## 2020-12-27 DIAGNOSIS — E669 Obesity, unspecified: Secondary | ICD-10-CM | POA: Diagnosis not present

## 2020-12-27 DIAGNOSIS — Z7901 Long term (current) use of anticoagulants: Secondary | ICD-10-CM | POA: Diagnosis not present

## 2020-12-27 DIAGNOSIS — M545 Low back pain, unspecified: Secondary | ICD-10-CM | POA: Diagnosis not present

## 2020-12-27 DIAGNOSIS — I739 Peripheral vascular disease, unspecified: Secondary | ICD-10-CM | POA: Diagnosis not present

## 2020-12-27 DIAGNOSIS — G4733 Obstructive sleep apnea (adult) (pediatric): Secondary | ICD-10-CM | POA: Diagnosis not present

## 2020-12-27 DIAGNOSIS — Z85118 Personal history of other malignant neoplasm of bronchus and lung: Secondary | ICD-10-CM | POA: Diagnosis not present

## 2020-12-27 DIAGNOSIS — I5042 Chronic combined systolic (congestive) and diastolic (congestive) heart failure: Secondary | ICD-10-CM | POA: Diagnosis not present

## 2020-12-27 DIAGNOSIS — G4700 Insomnia, unspecified: Secondary | ICD-10-CM | POA: Diagnosis not present

## 2020-12-27 DIAGNOSIS — J449 Chronic obstructive pulmonary disease, unspecified: Secondary | ICD-10-CM | POA: Diagnosis not present

## 2020-12-27 DIAGNOSIS — Z9981 Dependence on supplemental oxygen: Secondary | ICD-10-CM | POA: Diagnosis not present

## 2020-12-27 DIAGNOSIS — E049 Nontoxic goiter, unspecified: Secondary | ICD-10-CM | POA: Diagnosis not present

## 2020-12-27 DIAGNOSIS — Z6838 Body mass index (BMI) 38.0-38.9, adult: Secondary | ICD-10-CM | POA: Diagnosis not present

## 2020-12-27 DIAGNOSIS — I714 Abdominal aortic aneurysm, without rupture: Secondary | ICD-10-CM | POA: Diagnosis not present

## 2020-12-27 DIAGNOSIS — Z96652 Presence of left artificial knee joint: Secondary | ICD-10-CM | POA: Diagnosis not present

## 2020-12-27 DIAGNOSIS — I110 Hypertensive heart disease with heart failure: Secondary | ICD-10-CM | POA: Diagnosis not present

## 2020-12-27 DIAGNOSIS — F32A Depression, unspecified: Secondary | ICD-10-CM | POA: Diagnosis not present

## 2020-12-27 DIAGNOSIS — E039 Hypothyroidism, unspecified: Secondary | ICD-10-CM | POA: Diagnosis not present

## 2020-12-27 DIAGNOSIS — D649 Anemia, unspecified: Secondary | ICD-10-CM | POA: Diagnosis not present

## 2020-12-28 DIAGNOSIS — I5042 Chronic combined systolic (congestive) and diastolic (congestive) heart failure: Secondary | ICD-10-CM | POA: Diagnosis not present

## 2020-12-28 DIAGNOSIS — J9611 Chronic respiratory failure with hypoxia: Secondary | ICD-10-CM | POA: Diagnosis not present

## 2020-12-28 DIAGNOSIS — J449 Chronic obstructive pulmonary disease, unspecified: Secondary | ICD-10-CM | POA: Diagnosis not present

## 2020-12-28 DIAGNOSIS — I4892 Unspecified atrial flutter: Secondary | ICD-10-CM | POA: Diagnosis not present

## 2020-12-28 DIAGNOSIS — I110 Hypertensive heart disease with heart failure: Secondary | ICD-10-CM | POA: Diagnosis not present

## 2020-12-28 DIAGNOSIS — I4891 Unspecified atrial fibrillation: Secondary | ICD-10-CM | POA: Diagnosis not present

## 2021-01-02 DIAGNOSIS — J449 Chronic obstructive pulmonary disease, unspecified: Secondary | ICD-10-CM | POA: Diagnosis not present

## 2021-01-02 DIAGNOSIS — J9611 Chronic respiratory failure with hypoxia: Secondary | ICD-10-CM | POA: Diagnosis not present

## 2021-01-02 DIAGNOSIS — I110 Hypertensive heart disease with heart failure: Secondary | ICD-10-CM | POA: Diagnosis not present

## 2021-01-02 DIAGNOSIS — I4891 Unspecified atrial fibrillation: Secondary | ICD-10-CM | POA: Diagnosis not present

## 2021-01-02 DIAGNOSIS — I4892 Unspecified atrial flutter: Secondary | ICD-10-CM | POA: Diagnosis not present

## 2021-01-02 DIAGNOSIS — I5042 Chronic combined systolic (congestive) and diastolic (congestive) heart failure: Secondary | ICD-10-CM | POA: Diagnosis not present

## 2021-01-03 ENCOUNTER — Telehealth: Payer: Self-pay | Admitting: *Deleted

## 2021-01-03 NOTE — Telephone Encounter (Signed)
Patient called and stated "I started having a increase in bleeding, it started two weeks ago. The bleeding is getting worse. The bleeding is moderate to heavy and somewhere between bright and dark red. I am soaking the super size pads every 4/5 hours. I am having pain in the stomach area around the belly button. The patient is a cramping feeling, it's better when I lay down. My daughter and I did call Dr Gwynne Edinger office and they suggested I call your office." Explained that the message will be given to Dr Denman George and Lenna Sciara APP and the office will call her back tomorrow.

## 2021-01-03 NOTE — Telephone Encounter (Signed)
Per Lenna Sciara APP patient schedule to see Dr Denman George on 7/27.

## 2021-01-08 DIAGNOSIS — I5042 Chronic combined systolic (congestive) and diastolic (congestive) heart failure: Secondary | ICD-10-CM | POA: Diagnosis not present

## 2021-01-08 DIAGNOSIS — J9611 Chronic respiratory failure with hypoxia: Secondary | ICD-10-CM | POA: Diagnosis not present

## 2021-01-08 DIAGNOSIS — I4892 Unspecified atrial flutter: Secondary | ICD-10-CM | POA: Diagnosis not present

## 2021-01-08 DIAGNOSIS — J449 Chronic obstructive pulmonary disease, unspecified: Secondary | ICD-10-CM | POA: Diagnosis not present

## 2021-01-08 DIAGNOSIS — I4891 Unspecified atrial fibrillation: Secondary | ICD-10-CM | POA: Diagnosis not present

## 2021-01-08 DIAGNOSIS — I110 Hypertensive heart disease with heart failure: Secondary | ICD-10-CM | POA: Diagnosis not present

## 2021-01-09 ENCOUNTER — Other Ambulatory Visit: Payer: Self-pay

## 2021-01-09 ENCOUNTER — Inpatient Hospital Stay: Payer: Medicare Other | Attending: Gynecologic Oncology | Admitting: Gynecologic Oncology

## 2021-01-09 VITALS — BP 119/91 | HR 108 | Temp 97.0°F | Resp 22 | Ht 64.0 in | Wt 196.3 lb

## 2021-01-09 DIAGNOSIS — J449 Chronic obstructive pulmonary disease, unspecified: Secondary | ICD-10-CM | POA: Diagnosis not present

## 2021-01-09 DIAGNOSIS — Z79891 Long term (current) use of opiate analgesic: Secondary | ICD-10-CM | POA: Diagnosis not present

## 2021-01-09 DIAGNOSIS — I714 Abdominal aortic aneurysm, without rupture: Secondary | ICD-10-CM | POA: Diagnosis not present

## 2021-01-09 DIAGNOSIS — R9389 Abnormal findings on diagnostic imaging of other specified body structures: Secondary | ICD-10-CM

## 2021-01-09 DIAGNOSIS — Z79818 Long term (current) use of other agents affecting estrogen receptors and estrogen levels: Secondary | ICD-10-CM | POA: Insufficient documentation

## 2021-01-09 DIAGNOSIS — I110 Hypertensive heart disease with heart failure: Secondary | ICD-10-CM | POA: Diagnosis not present

## 2021-01-09 DIAGNOSIS — Z7901 Long term (current) use of anticoagulants: Secondary | ICD-10-CM | POA: Diagnosis not present

## 2021-01-09 DIAGNOSIS — Z86711 Personal history of pulmonary embolism: Secondary | ICD-10-CM | POA: Diagnosis not present

## 2021-01-09 DIAGNOSIS — I509 Heart failure, unspecified: Secondary | ICD-10-CM | POA: Insufficient documentation

## 2021-01-09 DIAGNOSIS — Z86718 Personal history of other venous thrombosis and embolism: Secondary | ICD-10-CM | POA: Insufficient documentation

## 2021-01-09 DIAGNOSIS — G8929 Other chronic pain: Secondary | ICD-10-CM | POA: Insufficient documentation

## 2021-01-09 DIAGNOSIS — E039 Hypothyroidism, unspecified: Secondary | ICD-10-CM | POA: Insufficient documentation

## 2021-01-09 DIAGNOSIS — Z79899 Other long term (current) drug therapy: Secondary | ICD-10-CM | POA: Diagnosis not present

## 2021-01-09 DIAGNOSIS — N950 Postmenopausal bleeding: Secondary | ICD-10-CM | POA: Diagnosis not present

## 2021-01-09 DIAGNOSIS — M545 Low back pain, unspecified: Secondary | ICD-10-CM | POA: Diagnosis not present

## 2021-01-09 DIAGNOSIS — Z923 Personal history of irradiation: Secondary | ICD-10-CM | POA: Diagnosis not present

## 2021-01-09 DIAGNOSIS — I4891 Unspecified atrial fibrillation: Secondary | ICD-10-CM | POA: Diagnosis not present

## 2021-01-09 DIAGNOSIS — Z85118 Personal history of other malignant neoplasm of bronchus and lung: Secondary | ICD-10-CM | POA: Insufficient documentation

## 2021-01-09 DIAGNOSIS — I739 Peripheral vascular disease, unspecified: Secondary | ICD-10-CM | POA: Insufficient documentation

## 2021-01-09 DIAGNOSIS — Z9221 Personal history of antineoplastic chemotherapy: Secondary | ICD-10-CM | POA: Diagnosis not present

## 2021-01-09 DIAGNOSIS — F32A Depression, unspecified: Secondary | ICD-10-CM | POA: Insufficient documentation

## 2021-01-09 NOTE — Patient Instructions (Signed)
Dr Denman George performed a biopsy of the uterine lining today.  Her office will call you soon as these results are available (she expects within 5 days).  She is ordered an ultrasound of the uterus to better evaluate the source of bleeding.  You are not a candidate for a hysterectomy due to your general deconditioning (difficulty moving about), and severe lung disease.  The surgery would present an unacceptably high risk of death.   Fortunately there are options to control the bleeding that do not require surgery.  If the cancer is identified that is high-grade, Dr. Denman George would recommend radiation to treat this cancer.  Alternatively, if no cancer is found on biopsy, this bleeding can be treated with tablet progesterone (Megace), or alternatively a progestin releasing IUD.  This should be able to be placed in the office.

## 2021-01-09 NOTE — Progress Notes (Signed)
Follow-up Note: Gyn-Onc  Consult was requested by Dr. Stann Mainland for the evaluation of [name] 80 y.o. female  CC:  Chief Complaint  Patient presents with   PMB (postmenopausal bleeding)    Assessment/Plan:  [name]  is a 80 y.o.  year old with multiple medical comorbidities and recurrent postmenopausal bleeding with previously benign sampling. She is not a candidate for hysterectomy due to medical comorbidities and very poor general conditioning/performance status.  The degree of bleeding is very light (not dangerous, not requiring transfusion, but does present a nuisance as she needs to purchase poise pads).   I am recommending transvaginal US. We will follow-up the results of the biopsy today.   Continue megace.   If today's biopsy shows no hyperplasia or malignancy we will offer to place a progestin releasing IUD (associated with improved control of bleeding compared to oral progestins) in the office. In the past she has been unwilling to proceed with IUD due to cost. We can re-evaluate the cost of this device for her.   If a high grade malignancy is found, I do not think that she is a good candidate for surgical staging, unless the tumor is noted to be high grade, in which case we would obtain imaging and, if negative for distant disease, plan on primary radiation therapy as progestins are less likely to be effective.  An alternative to IUD placement for benign pathology would be consideration of stopping her anticoagulant therapy, though this would carry with it a risk for developing stroke. Unfortunately given her complex medical history, there is no option which does not carry with it substantial risk for this patient.   She does not requires scheduled follow-up with the [place] clinic, however, will return if IUD placement or coordination of radiation is recommended based on biopsy results.   HPI: Ms [name] is a 80 year old woman who was initially seen in  consultation at the request of Dr Stann Mainland in July, 2020 for evaluation of postmenopausal bleeding and a thickened endometrial stripe.  The patient reported the onset of bleeding on July 15th, 2020. It was initially heavy and she was seen in the ED. She was started on megace therapy which significantly improved the bleeding A TVUS on 01/05/19 showed a uterus measuring 9.6x5x5cm with a 43mm endometrial stripe. The ovaries were normal.   She underwent attempted sampling in her OBGYN's office but the cervix could not be visualized and therefore she was referred for evaluation and consideration of further workup for a possible malignancy.   The patient has a significant past and current medical history. She is on O2 for COPD and restrictive lung disease. This may in part be secondary to effects of radiation exposure to the chest for a history of a non-small cell lung cancer treated with chemoradiation in 2004 (it was metastatic to the supraclavicular nodes and mediastinal nodes). It has been NED since that time.  She has peripheral vascular disease and a AAA which has been stented (endovascularly).  She is on long term anticoagulation with xarelto for A fib and a history of DVT and PE in 2004.  She has congestive heart failure and takes digoxin.  Additionally she is obese with a BMI of 41kg/m2.  She has HTN and hypercholesterolemia.   Interval Hx:  She was seen by me for initial consultation on 01/18/21. She was not felt to be a good candidate for hysterectomy. Endometrial biopsy was successful in the office and  revealed strips of atrophic, benign endometrium. She was recommended to undergo D&C with IUD placement. She declined the IUD placement due to cost.   She had no bleeding for 2 years. She self-discontinued the megace. She began experiencing new onset very light vaginal bleeding/spotting in the first week of July, 2022. She resumed taking her megace. She now lives in an assisted living facility.   She spends >50% of the day seated in a chair due to severe SOB and deconditioning. She requires a walker for ambulatory assistance.      Current Meds:  Outpatient Encounter Medications as of 01/09/2021  Medication Sig   albuterol (VENTOLIN HFA) 108 (90 Base) MCG/ACT inhaler TAKE 2 PUFFS EVERY 6 HOURS AS NEEDED FORSHORTNESS OF BREATH/WHEEZING   apixaban (ELIQUIS) 5 MG TABS tablet Take 1 tablet (5 mg total) by mouth 2 (two) times daily.   CALCIUM-MAGNESIUM-ZINC PO Take 1 tablet by mouth daily.    cephALEXin (KEFLEX) 500 MG capsule Take 1 capsule (500 mg total) by mouth 4 (four) times daily.   Cholecalciferol (VITAMIN D) 1000 UNITS capsule Take 1,000 Units by mouth daily.   citalopram (CELEXA) 20 MG tablet Take 20 mg by mouth daily.   digoxin (LANOXIN) 0.125 MG tablet Take 1 tablet (125 mcg total) by mouth daily.   furosemide (LASIX) 40 MG tablet Take 2 tablets ( 80 mg ) daily   gabapentin (NEURONTIN) 100 MG capsule Take 1 capsule by mouth 3 (three) times daily.   levothyroxine (SYNTHROID, LEVOTHROID) 88 MCG tablet Take 88 mcg by mouth daily.   Lidocaine (HM LIDOCAINE PATCH) 4 % PTCH Apply 1 patch topically as needed. (Patient not taking: Reported on 09/10/2020)   megestrol (MEGACE) 40 MG tablet Take 40 mg by mouth daily.    metolazone (ZAROXOLYN) 2.5 MG tablet Take 1 tablet (2.5 mg total) by mouth daily as needed (ONE DAILY PRN WEIGHT GAIN). USE AS NEEDED FOR WEIGHT GAIN   metoprolol tartrate (LOPRESSOR) 25 MG tablet TAKE 1/2 TABLET TWICE DAILY   morphine (MSIR) 15 MG tablet Take 5 mg by mouth as needed.   mupirocin ointment (BACTROBAN) 2 % mupirocin 2 % topical ointment  APPLY TO WOUNDS ON SKIN ONCE DAILY AT BANDAGE CHANGES   OXYGEN Inhale 4 L into the lungs daily. Patient states she wears 2L at night and 4L when up and moving   potassium chloride (KLOR-CON) 10 MEQ tablet Take 1 tablet (10 mEq total) by mouth 2 (two) times daily.   temazepam (RESTORIL) 15 MG capsule TAKE 1 CAPSULE AT BEDTIME  AS NEEDED  FOR  SLEEP   tiotropium (SPIRIVA HANDIHALER) 18 MCG inhalation capsule Place 1 capsule (18 mcg total) into inhaler and inhale daily.   tiZANidine (ZANAFLEX) 2 MG tablet Take 1 tablet (2 mg total) by mouth every 6 (six) hours as needed for muscle spasms.   vitamin B-12 (CYANOCOBALAMIN) 100 MCG tablet Take 100 mcg by mouth daily.   No facility-administered encounter medications on file as of 01/09/2021.    Allergy: No Known Allergies  Social Hx:   Social History   Socioeconomic History   Marital status: Divorced    Spouse name: Not on file   Number of children: 2   Years of education: Not on file   Highest education level: Not on file  Occupational History   Occupation: retired    Comment: Chiropractor  Tobacco Use   Smoking status: Former    Packs/day: 1.00    Years: 40.00    Pack years:  40.00    Types: Cigarettes    Quit date: 06/23/2001    Years since quitting: 19.5   Smokeless tobacco: Never  Vaping Use   Vaping Use: Never used  Substance and Sexual Activity   Alcohol use: Yes    Alcohol/week: 0.0 standard drinks    Comment: once a week (glass of wine)   Drug use: No   Sexual activity: Never  Other Topics Concern   Not on file  Social History Narrative   Not on file   Social Determinants of Health   Financial Resource Strain: Not on file  Food Insecurity: Not on file  Transportation Needs: Not on file  Physical Activity: Not on file  Stress: Not on file  Social Connections: Not on file  Intimate Partner Violence: Not on file    Past Surgical Hx:  Past Surgical History:  Procedure Laterality Date   ABDOMINAL AORTIC ANEURYSM REPAIR  ~ 2010   stent graft   BLADDER SURGERY  ~ 2003   sling   BREAST BIOPSY  1960's   both breast's - benign   CARDIOVASCULAR STRESS TEST  2012   No ischemia   CATARACT EXTRACTION W/ INTRAOCULAR LENS  IMPLANT, BILATERAL Bilateral ~ 2009   REPLACEMENT TOTAL KNEE Left ~ 2008   left   THYROIDECTOMY  ~ 1964    Past  Medical Hx:  Past Medical History:  Diagnosis Date   AAA (abdominal aortic aneurysm) ([place])    Anticoagulant long-term use    Failed on Coumadin. On Xarelto   Anxiety    Anxiety and depression    Atrial fib/flutter, transient June 2012   Chronic lower back pain    COPD (chronic obstructive pulmonary disease) ([place])    DVT (deep venous thrombosis) ([place]) 2004   BLE   Esophageal dysmotility    Exertional dyspnea    Hiatal hernia    History of bronchitis    History of fibrocystic disease of breast    History of uterine fibroid    HTN (hypertension)    Hyperlipidemia    Hypothyroidism    Normal nuclear stress test 2012   May 2012   OA (osteoarthritis)    "knees; left shoulder"   OSA (obstructive sleep apnea)    "haven't been using my CPAP lately" (01/21/12)   Ovarian mass    right benign   Ovarian mass    benign, right   PAD (peripheral artery disease) ([place])    Small cell carcinoma of lung ([place]) 2004   NON-SMALL CELL CARCINOMA OF THE LUNG, METASTATIC TO THE SUPRACLAVICULAR AND MEDIASTINAL LYMPH NODES; in remission    Past Gynecological History:  See HPI No LMP recorded. Patient is postmenopausal.  Family Hx:  Family History  Problem Relation Age of Onset   Heart disease Father    Heart attack Father    Diabetes type II Mother    Diabetes Mother    Hypertension Mother    Heart disease Brother        before age 4   Heart disease Sister        See's Dr. Acie Fredrickson   Breast cancer Neg Hx     Review of Systems:  Constitutional  Feels well,   ENT Normal appearing ears and nares bilaterally Skin/Breast  No rash, sores, jaundice, itching, dryness Cardiovascular dfg No chest pain, +shortness of breath, + edema  Pulmonary  + cough and wheeze.  Gastro Intestinal  No nausea, vomitting, or diarrhoea. No bright red blood per rectum,  no abdominal pain, change isdfsdfn bowel movement, or constipation.  Genito Urinary  No frequency, urgency, dysuria, + bleeding Musculo  Skeletal  + back pain  Neurologic  No weakness, numbness, change in gait,  Psychology  No depression, anxiety, insomnia.   Vitals:  There were no vitals taken for this visit.  Physical Exam: WD in NAD Neck  Supple NROM, without any enlargements.  Lymph Node Survey No cervical supraclavicular or inguinal adenopathy Cardiovascular  Pulse normal rate, irregularity. S1 and S2 normal.  Lungs  Decreased BS bilaterally Skin  No rash/lesions/breakdown  Psychiatry  Alert and oriented to person, place, and time  Abdomen  Normoactive bowel sounds, abdomen soft, non-tender and obese without evidence of hernia.  Back No CVA tenderness Genito Urinary  Vulva/vagina: Normal external female genitalia.  No lesions. No discharge or bleeding.  Bladder/urethra:  No lesions or masses, well supported bladder  Vagina: normal  Cervix: Normal appearing, no lesions.  Uterus: Slightly enlarged, mobile, no parametrial involvement or nodularity.  Adnexa: no palpable masses. Rectal  deferred  Extremities  No bilateral cyanosis, clubbing or edema.   Procedure Note:  Preop Dx: postmenopausal bleeding, thickened endometrium Postop Dx: same Procedure: endometrial biopsy Surgeon: Dorann Ou, MD EBL: minimal Specimens: endometrial biopsy Complications: none Procedure Details: The patient provided verbal consent and verbal time out was performed. The speculum was placed and the cervix visualized. The tenaculum grasped the anterior lip of the cervix. The pipelle was inserted to a fundal depth of 9cm. 3 passes of the pipelle were made.  Hemostasis was observed. The patient tolerated the procedure well.    Thereasa Solo, MD  01/09/2021, 11:18 AM

## 2021-01-11 LAB — SURGICAL PATHOLOGY

## 2021-01-15 ENCOUNTER — Other Ambulatory Visit: Payer: Self-pay

## 2021-01-15 ENCOUNTER — Ambulatory Visit (HOSPITAL_COMMUNITY)
Admission: RE | Admit: 2021-01-15 | Discharge: 2021-01-15 | Disposition: A | Discharge status: Home / Self Care | Payer: Medicare Other | Source: Ambulatory Visit | Attending: Gynecologic Oncology | Admitting: Gynecologic Oncology

## 2021-01-15 DIAGNOSIS — N950 Postmenopausal bleeding: Secondary | ICD-10-CM | POA: Diagnosis not present

## 2021-01-16 ENCOUNTER — Ambulatory Visit: Payer: Medicare Other | Admitting: Gynecologic Oncology

## 2021-01-17 DIAGNOSIS — I4891 Unspecified atrial fibrillation: Secondary | ICD-10-CM | POA: Diagnosis not present

## 2021-01-17 DIAGNOSIS — I5042 Chronic combined systolic (congestive) and diastolic (congestive) heart failure: Secondary | ICD-10-CM | POA: Diagnosis not present

## 2021-01-17 DIAGNOSIS — J9611 Chronic respiratory failure with hypoxia: Secondary | ICD-10-CM | POA: Diagnosis not present

## 2021-01-17 DIAGNOSIS — I110 Hypertensive heart disease with heart failure: Secondary | ICD-10-CM | POA: Diagnosis not present

## 2021-01-17 DIAGNOSIS — I4892 Unspecified atrial flutter: Secondary | ICD-10-CM | POA: Diagnosis not present

## 2021-01-17 DIAGNOSIS — J449 Chronic obstructive pulmonary disease, unspecified: Secondary | ICD-10-CM | POA: Diagnosis not present

## 2021-01-18 DIAGNOSIS — Z23 Encounter for immunization: Secondary | ICD-10-CM | POA: Diagnosis not present

## 2021-01-20 ENCOUNTER — Encounter: Payer: Self-pay | Admitting: Internal Medicine

## 2021-01-20 DIAGNOSIS — E785 Hyperlipidemia, unspecified: Secondary | ICD-10-CM | POA: Diagnosis not present

## 2021-01-20 DIAGNOSIS — I10 Essential (primary) hypertension: Secondary | ICD-10-CM | POA: Diagnosis not present

## 2021-01-20 DIAGNOSIS — I509 Heart failure, unspecified: Secondary | ICD-10-CM | POA: Diagnosis not present

## 2021-01-20 DIAGNOSIS — E039 Hypothyroidism, unspecified: Secondary | ICD-10-CM | POA: Diagnosis not present

## 2021-01-20 NOTE — Assessment & Plan Note (Signed)
Discussed meds Plan- change from Stiolto to Spiriva as requested

## 2021-01-20 NOTE — Assessment & Plan Note (Signed)
Does best on O2 2L with continuous flow.  Plan- for POC use 3-4L

## 2021-01-23 DIAGNOSIS — I5042 Chronic combined systolic (congestive) and diastolic (congestive) heart failure: Secondary | ICD-10-CM | POA: Diagnosis not present

## 2021-01-23 DIAGNOSIS — I4891 Unspecified atrial fibrillation: Secondary | ICD-10-CM | POA: Diagnosis not present

## 2021-01-23 DIAGNOSIS — I110 Hypertensive heart disease with heart failure: Secondary | ICD-10-CM | POA: Diagnosis not present

## 2021-01-23 DIAGNOSIS — J449 Chronic obstructive pulmonary disease, unspecified: Secondary | ICD-10-CM | POA: Diagnosis not present

## 2021-01-23 DIAGNOSIS — I4892 Unspecified atrial flutter: Secondary | ICD-10-CM | POA: Diagnosis not present

## 2021-01-23 DIAGNOSIS — J9611 Chronic respiratory failure with hypoxia: Secondary | ICD-10-CM | POA: Diagnosis not present

## 2021-01-25 ENCOUNTER — Telehealth: Payer: Self-pay

## 2021-01-25 NOTE — Telephone Encounter (Signed)
LM for Ms Alessandrini to call the office back to discuss the results of the [place] and Dr. Serita Grit recommendations.

## 2021-01-26 DIAGNOSIS — Z8701 Personal history of pneumonia (recurrent): Secondary | ICD-10-CM | POA: Diagnosis not present

## 2021-01-26 DIAGNOSIS — E669 Obesity, unspecified: Secondary | ICD-10-CM | POA: Diagnosis not present

## 2021-01-26 DIAGNOSIS — D649 Anemia, unspecified: Secondary | ICD-10-CM | POA: Diagnosis not present

## 2021-01-26 DIAGNOSIS — G4700 Insomnia, unspecified: Secondary | ICD-10-CM | POA: Diagnosis not present

## 2021-01-26 DIAGNOSIS — Z9981 Dependence on supplemental oxygen: Secondary | ICD-10-CM | POA: Diagnosis not present

## 2021-01-26 DIAGNOSIS — F32A Depression, unspecified: Secondary | ICD-10-CM | POA: Diagnosis not present

## 2021-01-26 DIAGNOSIS — Z6838 Body mass index (BMI) 38.0-38.9, adult: Secondary | ICD-10-CM | POA: Diagnosis not present

## 2021-01-26 DIAGNOSIS — G4733 Obstructive sleep apnea (adult) (pediatric): Secondary | ICD-10-CM | POA: Diagnosis not present

## 2021-01-26 DIAGNOSIS — I5042 Chronic combined systolic (congestive) and diastolic (congestive) heart failure: Secondary | ICD-10-CM | POA: Diagnosis not present

## 2021-01-26 DIAGNOSIS — Z96652 Presence of left artificial knee joint: Secondary | ICD-10-CM | POA: Diagnosis not present

## 2021-01-26 DIAGNOSIS — I739 Peripheral vascular disease, unspecified: Secondary | ICD-10-CM | POA: Diagnosis not present

## 2021-01-26 DIAGNOSIS — M545 Low back pain, unspecified: Secondary | ICD-10-CM | POA: Diagnosis not present

## 2021-01-26 DIAGNOSIS — I714 Abdominal aortic aneurysm, without rupture: Secondary | ICD-10-CM | POA: Diagnosis not present

## 2021-01-26 DIAGNOSIS — G8929 Other chronic pain: Secondary | ICD-10-CM | POA: Diagnosis not present

## 2021-01-26 DIAGNOSIS — Z85118 Personal history of other malignant neoplasm of bronchus and lung: Secondary | ICD-10-CM | POA: Diagnosis not present

## 2021-01-26 DIAGNOSIS — I4892 Unspecified atrial flutter: Secondary | ICD-10-CM | POA: Diagnosis not present

## 2021-01-26 DIAGNOSIS — M1711 Unilateral primary osteoarthritis, right knee: Secondary | ICD-10-CM | POA: Diagnosis not present

## 2021-01-26 DIAGNOSIS — M19012 Primary osteoarthritis, left shoulder: Secondary | ICD-10-CM | POA: Diagnosis not present

## 2021-01-26 DIAGNOSIS — J9611 Chronic respiratory failure with hypoxia: Secondary | ICD-10-CM | POA: Diagnosis not present

## 2021-01-26 DIAGNOSIS — Z7901 Long term (current) use of anticoagulants: Secondary | ICD-10-CM | POA: Diagnosis not present

## 2021-01-26 DIAGNOSIS — J449 Chronic obstructive pulmonary disease, unspecified: Secondary | ICD-10-CM | POA: Diagnosis not present

## 2021-01-26 DIAGNOSIS — E049 Nontoxic goiter, unspecified: Secondary | ICD-10-CM | POA: Diagnosis not present

## 2021-01-26 DIAGNOSIS — I110 Hypertensive heart disease with heart failure: Secondary | ICD-10-CM | POA: Diagnosis not present

## 2021-01-26 DIAGNOSIS — E039 Hypothyroidism, unspecified: Secondary | ICD-10-CM | POA: Diagnosis not present

## 2021-01-26 DIAGNOSIS — I4891 Unspecified atrial fibrillation: Secondary | ICD-10-CM | POA: Diagnosis not present

## 2021-01-28 NOTE — Telephone Encounter (Signed)
Told Ms Soderberg that the US shows that the endometrium is thickened.  She can continue with the megace or  have an IUD placed per Joylene John, NP. Ms Abril would like to continue taking the Megace at this time.

## 2021-01-29 DIAGNOSIS — H53002 Unspecified amblyopia, left eye: Secondary | ICD-10-CM | POA: Diagnosis not present

## 2021-01-29 DIAGNOSIS — H5211 Myopia, right eye: Secondary | ICD-10-CM | POA: Diagnosis not present

## 2021-01-29 DIAGNOSIS — H353131 Nonexudative age-related macular degeneration, bilateral, early dry stage: Secondary | ICD-10-CM | POA: Diagnosis not present

## 2021-01-29 DIAGNOSIS — Z961 Presence of intraocular lens: Secondary | ICD-10-CM | POA: Diagnosis not present

## 2021-01-31 DIAGNOSIS — I5042 Chronic combined systolic (congestive) and diastolic (congestive) heart failure: Secondary | ICD-10-CM | POA: Diagnosis not present

## 2021-01-31 DIAGNOSIS — J449 Chronic obstructive pulmonary disease, unspecified: Secondary | ICD-10-CM | POA: Diagnosis not present

## 2021-01-31 DIAGNOSIS — I4891 Unspecified atrial fibrillation: Secondary | ICD-10-CM | POA: Diagnosis not present

## 2021-01-31 DIAGNOSIS — I4892 Unspecified atrial flutter: Secondary | ICD-10-CM | POA: Diagnosis not present

## 2021-01-31 DIAGNOSIS — I110 Hypertensive heart disease with heart failure: Secondary | ICD-10-CM | POA: Diagnosis not present

## 2021-01-31 DIAGNOSIS — J9611 Chronic respiratory failure with hypoxia: Secondary | ICD-10-CM | POA: Diagnosis not present

## 2021-02-07 DIAGNOSIS — J449 Chronic obstructive pulmonary disease, unspecified: Secondary | ICD-10-CM | POA: Diagnosis not present

## 2021-02-07 DIAGNOSIS — J9611 Chronic respiratory failure with hypoxia: Secondary | ICD-10-CM | POA: Diagnosis not present

## 2021-02-07 DIAGNOSIS — I110 Hypertensive heart disease with heart failure: Secondary | ICD-10-CM | POA: Diagnosis not present

## 2021-02-07 DIAGNOSIS — I5042 Chronic combined systolic (congestive) and diastolic (congestive) heart failure: Secondary | ICD-10-CM | POA: Diagnosis not present

## 2021-02-07 DIAGNOSIS — I4891 Unspecified atrial fibrillation: Secondary | ICD-10-CM | POA: Diagnosis not present

## 2021-02-07 DIAGNOSIS — I4892 Unspecified atrial flutter: Secondary | ICD-10-CM | POA: Diagnosis not present

## 2021-02-18 DIAGNOSIS — Z5181 Encounter for therapeutic drug level monitoring: Secondary | ICD-10-CM | POA: Diagnosis not present

## 2021-02-18 DIAGNOSIS — Z79891 Long term (current) use of opiate analgesic: Secondary | ICD-10-CM | POA: Diagnosis not present

## 2021-02-18 DIAGNOSIS — G894 Chronic pain syndrome: Secondary | ICD-10-CM | POA: Diagnosis not present

## 2021-02-18 DIAGNOSIS — M5136 Other intervertebral disc degeneration, lumbar region: Secondary | ICD-10-CM | POA: Diagnosis not present

## 2021-02-18 DIAGNOSIS — M25512 Pain in left shoulder: Secondary | ICD-10-CM | POA: Diagnosis not present

## 2021-02-19 DIAGNOSIS — I110 Hypertensive heart disease with heart failure: Secondary | ICD-10-CM | POA: Diagnosis not present

## 2021-02-19 DIAGNOSIS — I5042 Chronic combined systolic (congestive) and diastolic (congestive) heart failure: Secondary | ICD-10-CM | POA: Diagnosis not present

## 2021-02-19 DIAGNOSIS — I4891 Unspecified atrial fibrillation: Secondary | ICD-10-CM | POA: Diagnosis not present

## 2021-02-19 DIAGNOSIS — I4892 Unspecified atrial flutter: Secondary | ICD-10-CM | POA: Diagnosis not present

## 2021-02-19 DIAGNOSIS — J449 Chronic obstructive pulmonary disease, unspecified: Secondary | ICD-10-CM | POA: Diagnosis not present

## 2021-02-19 DIAGNOSIS — J9611 Chronic respiratory failure with hypoxia: Secondary | ICD-10-CM | POA: Diagnosis not present

## 2021-02-19 NOTE — Progress Notes (Signed)
Cardiology Clinic Note   Patient Name: [name] Date of Encounter: 02/21/2021  Primary Care Provider:  Burnard Bunting, MD Primary Cardiologist:  Peter Martinique, MD  Patient Profile    [name]. [name] 80 year old female presents today for follow-up evaluation of her bradycardia and chronic combined systolic and diastolic CHF. Past Medical History    Past Medical History:  Diagnosis Date   AAA (abdominal aortic aneurysm) (HCC)    Anticoagulant long-term use    Failed on Coumadin. On Xarelto   Anxiety    Anxiety and depression    Atrial fib/flutter, transient June 2012   Chronic lower back pain    COPD (chronic obstructive pulmonary disease) ([place])    DVT (deep venous thrombosis) ([place]) 2004   BLE   Esophageal dysmotility    Exertional dyspnea    Hiatal hernia    History of bronchitis    History of fibrocystic disease of breast    History of uterine fibroid    HTN (hypertension)    Hyperlipidemia    Hypothyroidism    Normal nuclear stress test 2012   May 2012   OA (osteoarthritis)    "knees; left shoulder"   OSA (obstructive sleep apnea)    "haven't been using my CPAP lately" (01/21/12)   Ovarian mass    right benign   Ovarian mass    benign, right   PAD (peripheral artery disease) ([place])    Small cell carcinoma of lung ([place]) 2004   NON-SMALL CELL CARCINOMA OF THE LUNG, METASTATIC TO THE SUPRACLAVICULAR AND MEDIASTINAL LYMPH NODES; in remission   Past Surgical History:  Procedure Laterality Date   ABDOMINAL AORTIC ANEURYSM REPAIR  ~ 2010   stent graft   BLADDER SURGERY  ~ 2003   sling   BREAST BIOPSY  1960's   both breast's - benign   CARDIOVASCULAR STRESS TEST  2012   No ischemia   CATARACT EXTRACTION W/ INTRAOCULAR LENS  IMPLANT, BILATERAL Bilateral ~ 2009   REPLACEMENT TOTAL KNEE Left ~ 2008   left   THYROIDECTOMY  ~ 1964    Allergies  No Known Allergies  History of Present Illness    [name] has a past medical history of atrial  fibrillation on Xarelto, diastolic CHF, COPD on home O2, anxiety, DVT, hypertension, hyperlipidemia, hypothyroidism, osteoarthritis, lung cancer, OSA, PVD, CHA2DS2-VASc score 7 (female, age x2, DVT x2, CHF, hypertension).   She was admitted to the hospital 3/26-09/28/2016 for A. fib RVR and CHF exacerbation.  She was not a candidate for anemia due to her lung disease, T consent and sotalol were also contraindicated due to her prolonged QT interval.  Multaq and flecainide are not good choices with congestive heart failure.  Choice was made to manage her symptoms with rate control and diuresis.   She was seen 05/2017 by Dr. Donnetta Hutching.  A CT showed stable saccular aneurysm.  She was also followed by Dr. Annamaria Boots for her chronic lung disease.   She was last seen by Dr. Martinique on 03/2018.  At that time she was doing well her breathing was stable.  She did experience shortness of breath with activity and she continued to use home oxygen.  Her weight was stable she denied any increased edema.  She continued to use metolazone sparingly as needed with good response.  However, she did not weigh regularly.  She was also having pain with her left shoulder and wanted to know if she would be an acceptable surgical candidate.  She presented the clinic 07/21/19 and stated she called the office on the 25th due to her in-home health nurse noticing her low heart rate and increased fatigue.  She was instructed to decrease her metoprolol tartrate to 25 mg twice daily.  She also stated that she was taking care of her daughter who has amyloid and is receiving cancer treatment as well as her granddaughter who is mentally  and physically disabled.  She felt she had been managing fairly well with increased help from home health nursing.  She had been seeing pulmonary who were managing her home oxygen needs.     She presented to the clinic 08/23/2019 for follow-up and stated she had been doing well.  Her daughter was visiting from [place] and  helping her with her other daughter.  Her daughter that had amyloid had been responding to treatment well.  She stated that she had started to climb the stairs in her house and that her weight had remained stable.  She continued to use 4 L nasal cannula through the day and 2 L at night.   She was still receiving assistance from home health.  She stated she had not been taking/needing her metolazone medication.  I  refilled her prescription  and planned follow-up with Dr. Martinique in 3 months.  She was seen by Dr. Martinique on 06/27/2020.  During that time she was noted to have stable chronic shortness of breath.  She was cardiac unaware.  Her PCP had switched her from Xarelto to apixaban.  She was unable to afford Xarelto.  Her weight was down and her swelling stable.  She was only taking furosemide 80 mg daily and continue to not need her metolazone.  She presents to the clinic today for follow-up evaluation states she has moved into senior living.  She reports that she receives her medications on a scheduled basis and is enjoying living there.  She reports that her daughter from [place] has gone back.  Her daughter with amyloidosis continues to receive treatment and is doing fairly well.  She reports that she feels her breathing and activity tolerance is gotten somewhat worse.  She has lost around 20 pounds.  Her blood pressure today is 128/66 with a heart rate of 61.  She continues to try to eat well.  She reports that she enjoys eating Brussels sprouts which was on the menu today.  She is planning on eating and when she gets home.  I will order an echocardiogram, continue her current medication regimen, order a CBC and BMP, and have her follow-up in 6 months.   She denies chest pain, increased shortness of breath, increased lower extremity edema, fatigue, increased palpitations, melena, hematuria, hemoptysis, diaphoresis, weakness, presyncope, syncope, orthopnea, and PND.  Home Medications    Prior to Admission  medications   Medication Sig Start Date End Date Taking? Authorizing Provider  albuterol (VENTOLIN HFA) 108 (90 Base) MCG/ACT inhaler TAKE 2 PUFFS EVERY 6 HOURS AS NEEDED FORSHORTNESS OF BREATH/WHEEZING 09/10/20   Baird Lyons D, MD  apixaban (ELIQUIS) 5 MG TABS tablet Take 1 tablet (5 mg total) by mouth 2 (two) times daily. 06/27/20   Martinique, Peter M, MD  CALCIUM-MAGNESIUM-ZINC PO Take 1 tablet by mouth daily.     [provider]  cephALEXin (KEFLEX) 500 MG capsule Take 1 capsule (500 mg total) by mouth 4 (four) times daily. 11/18/20   Hayden Rasmussen, MD  Cholecalciferol (VITAMIN D) 1000 UNITS capsule Take 1,000 Units by mouth  daily.    [provider]  citalopram (CELEXA) 20 MG tablet Take 20 mg by mouth daily.    [provider]  digoxin (LANOXIN) 0.125 MG tablet Take 1 tablet (125 mcg total) by mouth daily. 12/21/20   Martinique, Peter M, MD  furosemide (LASIX) 40 MG tablet Take 2 tablets ( 80 mg ) daily 12/21/20   Martinique, Peter M, MD  gabapentin (NEURONTIN) 100 MG capsule Take 1 capsule by mouth 3 (three) times daily.    [provider]  levothyroxine (SYNTHROID, LEVOTHROID) 88 MCG tablet Take 88 mcg by mouth daily.    [provider]  Lidocaine (HM LIDOCAINE PATCH) 4 % PTCH Apply 1 patch topically as needed. Patient not taking: Reported on 09/10/2020 11/30/19   Faustino Congress, NP  megestrol (MEGACE) 40 MG tablet Take 40 mg by mouth daily.     [provider]  metolazone (ZAROXOLYN) 2.5 MG tablet Take 1 tablet (2.5 mg total) by mouth daily as needed (ONE DAILY PRN WEIGHT GAIN). USE AS NEEDED FOR WEIGHT GAIN 09/06/19   Deberah Pelton, NP  metoprolol tartrate (LOPRESSOR) 25 MG tablet TAKE 1/2 TABLET TWICE DAILY 11/08/20   Martinique, Peter M, MD  morphine (MSIR) 15 MG tablet Take 5 mg by mouth as needed. 10/13/16   [provider]  mupirocin ointment (BACTROBAN) 2 % mupirocin 2 % topical ointment  APPLY TO WOUNDS ON SKIN ONCE DAILY AT BANDAGE  CHANGES    [provider]  OXYGEN Inhale 4 L into the lungs daily. Patient states she wears 2L at night and 4L when up and moving    [provider]  potassium chloride (KLOR-CON) 10 MEQ tablet Take 1 tablet (10 mEq total) by mouth 2 (two) times daily. 12/21/20   Martinique, Peter M, MD  temazepam (RESTORIL) 15 MG capsule TAKE 1 CAPSULE AT BEDTIME AS NEEDED  FOR  SLEEP 07/16/16   Baird Lyons D, MD  tiotropium (SPIRIVA HANDIHALER) 18 MCG inhalation capsule Place 1 capsule (18 mcg total) into inhaler and inhale daily. 09/10/20   Deneise Lever, MD  tiZANidine (ZANAFLEX) 2 MG tablet Take 1 tablet (2 mg total) by mouth every 6 (six) hours as needed for muscle spasms. 11/30/19   Faustino Congress, NP  vitamin B-12 (CYANOCOBALAMIN) 100 MCG tablet Take 100 mcg by mouth daily.    [provider]    Family History    Family History  Problem Relation Age of Onset   Heart disease Father    Heart attack Father    Diabetes type II Mother    Diabetes Mother    Hypertension Mother    Heart disease Brother        before age 80   Heart disease Sister        See's Dr. Acie Fredrickson   Breast cancer Neg Hx    She indicated that her mother is deceased. She indicated that her father is deceased. She indicated that only one of her four sisters is alive. She indicated that two of her three brothers are alive. She indicated that both of her daughters are alive. She indicated that the status of her neg hx is unknown.  Social History    Social History   Socioeconomic History   Marital status: Divorced    Spouse name: Not on file   Number of children: 2   Years of education: Not on file   Highest education level: Not on file  Occupational History   Occupation: retired  Comment: Chiropractor  Tobacco Use   Smoking status: Former    Packs/day: 1.00    Years: 40.00    Pack years: 40.00    Types: Cigarettes    Quit date: 06/23/2001    Years since quitting: 19.6   Smokeless  tobacco: Never  Vaping Use   Vaping Use: Never used  Substance and Sexual Activity   Alcohol use: Yes    Alcohol/week: 0.0 standard drinks    Comment: once a week (glass of wine)   Drug use: No   Sexual activity: Never  Other Topics Concern   Not on file  Social History Narrative   Not on file   Social Determinants of Health   Financial Resource Strain: Not on file  Food Insecurity: Not on file  Transportation Needs: Not on file  Physical Activity: Not on file  Stress: Not on file  Social Connections: Not on file  Intimate Partner Violence: Not on file     Review of Systems    General:  No chills, fever, night sweats or weight changes.  Cardiovascular:  No chest pain, dyspnea on exertion, edema, orthopnea, palpitations, paroxysmal nocturnal dyspnea. Dermatological: No rash, lesions/masses Respiratory: No cough, dyspnea Urologic: No hematuria, dysuria Abdominal:   No nausea, vomiting, diarrhea, bright red blood per rectum, melena, or hematemesis Neurologic:  No visual changes, wkns, changes in mental status. All other systems reviewed and are otherwise negative except as noted above.  Physical Exam    VS:  BP 128/66 (BP Location: Right Arm, Patient Position: Sitting, Cuff Size: Large)   Pulse 61   Ht 5\' 2"  (1.575 m)   Wt 201 lb 12.8 oz (91.5 kg)   SpO2 (!) 89%   BMI 36.91 kg/m  , BMI Body mass index is 36.91 kg/m. GEN: Well nourished, well developed, in no acute distress. HEENT: normal. Neck: Supple, no JVD, carotid bruits, or masses. Cardiac: RRR, no murmurs, rubs, or gallops. No clubbing, cyanosis, generalized bilateral lower extremity nonpitting edema.  Radials/DP/PT 2+ and equal bilaterally.  Respiratory:  Respirations regular and unlabored, clear to auscultation bilaterally. GI: Soft, nontender, nondistended, BS + x 4. MS: no deformity or atrophy. Skin: warm and dry, no rash. Neuro:  Strength and sensation are intact. Psych: Normal affect.  Accessory  Clinical Findings    Recent Labs: 11/18/2020: ALT 18; B Natriuretic Peptide 455.2; BUN 13; Creatinine, Ser 0.90; Hemoglobin 16.4; Platelets 129; Potassium 4.0; Sodium 137   Recent Lipid Panel No results found for: CHOL, TRIG, HDL, CHOLHDL, VLDL, LDLCALC, LDLDIRECT  ECG personally reviewed by me today-none today.  Echocardiogram 09/17/2016  - Left ventricle: The cavity size was normal. Wall thickness was   increased in a pattern of moderate LVH. Systolic function was   moderately reduced. The estimated ejection fraction was in the   range of 35% to 40%. Diffuse hypokinesis. The study is not   technically sufficient to allow evaluation of LV diastolic function. - Aortic valve: Trileaflet. Sclerosis without stenosis. There was   no regurgitation. - Mitral valve: Calcified annulus. Mildly thickened leaflets . - Left atrium: The atrium was mildly dilated. - Right ventricle: The cavity size was normal. Mildly decreased RV   systolic function. - Right atrium: Moderately dilated. - Tricuspid valve: There was moderate regurgitation. - Pulmonary arteries: PA peak pressure: 49 mm Hg (S). - Inferior vena cava: The vessel was dilated. The respirophasic   diameter changes were blunted (< 50%), consistent with elevated   central venous pressure.  Impressions:  -  Compared to a prior study in 2016, the LVEF is reduced now to   35-40%. A-fib wtih RVR is noted. The ascending aorta meausres 4.0   cm. There is mild AI, mild LAE, moderate RAE, moderate TR and   RVSP of 49 mmHg with a dilated IVC.  Assessment & Plan   1.   Chronic combined systolic/diastolic heart failure-continues with generalized bilateral lower extremity edema today.  Feels her SOB is some worse. Continue metoprolol tartrate, furosemide, metolazone Heart healthy low-sodium diet-salty 6 given Continue fluid restriction Increase physical activity as tolerated  Repeat echocardiogram  Order CBC, BMP  Bradycardia-heart rate today  61 bpm.    No increased fatigue or dizziness.  Denies presyncope or syncope. Continue metoprolol 12.5 Continue heart healthy diet   COPD-continues to use home O2 4 L liters through the day and 2 L at night.  Stable. Monitored by pulmonary   Persistent atrial fibrillation-heart rate today 61.  Reports compliance with Xarelto and denies bleeding issues. Continue Xarelto 20 mg daily Continue metoprolol tartrate 25 mg twice daily   Hypercholesterolemia-LDL 120 01/19/20 Heart healthy low-sodium high-fiber diet Increase physical activity as tolerated Monitored by PCP   Disposition: Follow-up with Dr. Martinique or me 2-3 months.   Jossie Ng. Rhylee Pucillo NP-C    02/21/2021, 2:38 PM [facility] [address] Office [phone] Fax [phone]  Notice: This dictation was prepared with Dragon dictation along with smaller phrase technology. Any transcriptional errors that result from this process are unintentional and may not be corrected upon review.  I spent 14 minutes examining this patient, reviewing medications, and using patient centered shared decision making involving her cardiac care.  Prior to her visit I spent greater than 20 minutes reviewing her past medical history,  medications, and prior cardiac tests.

## 2021-02-21 ENCOUNTER — Ambulatory Visit (INDEPENDENT_AMBULATORY_CARE_PROVIDER_SITE_OTHER): Payer: Medicare Other | Admitting: General Practice

## 2021-02-21 ENCOUNTER — Other Ambulatory Visit: Payer: Self-pay

## 2021-02-21 ENCOUNTER — Encounter: Payer: Self-pay | Admitting: General Practice

## 2021-02-21 VITALS — BP 128/66 | HR 61 | Ht 62.0 in | Wt 201.8 lb

## 2021-02-21 DIAGNOSIS — I5042 Chronic combined systolic (congestive) and diastolic (congestive) heart failure: Secondary | ICD-10-CM | POA: Diagnosis not present

## 2021-02-21 DIAGNOSIS — J42 Unspecified chronic bronchitis: Secondary | ICD-10-CM

## 2021-02-21 DIAGNOSIS — Z79899 Other long term (current) drug therapy: Secondary | ICD-10-CM | POA: Diagnosis not present

## 2021-02-21 DIAGNOSIS — I4819 Other persistent atrial fibrillation: Secondary | ICD-10-CM

## 2021-02-21 DIAGNOSIS — R001 Bradycardia, unspecified: Secondary | ICD-10-CM | POA: Diagnosis not present

## 2021-02-21 DIAGNOSIS — E7800 Pure hypercholesterolemia, unspecified: Secondary | ICD-10-CM | POA: Diagnosis not present

## 2021-02-21 NOTE — Patient Instructions (Signed)
Medication Instructions:  The current medical regimen is effective;  continue present plan and medications as directed. Please refer to the Current Medication list given to you today.  *If you need a refill on your cardiac medications before your next appointment, please call your pharmacy*  Lab Work: [place] If you have labs (blood work) drawn today and your tests are completely normal, you will receive your results only by:  [place] (if you have MyChart) OR A paper copy in the mail.  If you have any lab test that is abnormal or we need to change your treatment, we will call you to review the results. You may go to any [facility] that is convenient for you however, we do have a lab in our office that is able to assist you. You DO NOT need an appointment for our lab. The lab is open 8:00am and closes at 4:00pm. Lunch 12:45 - 1:45pm.  Testing/Procedures: [place] physician has requested that you have an echocardiogram. Echocardiography is a painless test that uses sound waves to create images of your heart. It provides your doctor with information about the size and shape of your heart and how well your heart's chambers and valves are working. This procedure takes approximately one hour. There are no restrictions for this procedure.   Follow-Up: Your next appointment:  6 month(s) In Person with Peter Martinique, MD   At [facility], you and your health needs are our priority.  As part of our continuing mission to provide you with exceptional heart care, we have created designated Provider Care Teams.  These Care Teams include your primary Cardiologist (physician) and Advanced Practice Providers (APPs -  Physician Assistants and Nurse Practitioners) who all work together to provide you with the care you need, when you need it.

## 2021-02-22 LAB — CBC
Hematocrit: 49.3 % — ABNORMAL HIGH (ref 34.0–46.6)
Hemoglobin: 16.7 g/dL — ABNORMAL HIGH (ref 11.1–15.9)
MCH: 34.2 pg — ABNORMAL HIGH (ref 26.6–33.0)
MCHC: 33.9 g/dL (ref 31.5–35.7)
MCV: 101 fL — ABNORMAL HIGH (ref 79–97)
Platelets: 160 10*3/uL (ref 150–450)
RBC: 4.89 x10E6/uL (ref 3.77–5.28)
RDW: 12.7 % (ref 11.7–15.4)
WBC: 7.8 10*3/uL (ref 3.4–10.8)

## 2021-02-22 LAB — BASIC METABOLIC PANEL
BUN/Creatinine Ratio: 13 (ref 12–28)
BUN: 12 mg/dL (ref 8–27)
CO2: 25 mmol/L (ref 20–29)
Calcium: 9.4 mg/dL (ref 8.7–10.3)
Chloride: 101 mmol/L (ref 96–106)
Creatinine, Ser: 0.91 mg/dL (ref 0.57–1.00)
Glucose: 87 mg/dL (ref 65–99)
Potassium: 4.4 mmol/L (ref 3.5–5.2)
Sodium: 141 mmol/L (ref 134–144)
eGFR: 63 mL/min/{1.73_m2} (ref >59)

## 2021-02-28 DIAGNOSIS — U071 COVID-19: Secondary | ICD-10-CM | POA: Diagnosis not present

## 2021-03-03 DIAGNOSIS — F330 Major depressive disorder, recurrent, mild: Secondary | ICD-10-CM | POA: Diagnosis not present

## 2021-03-03 DIAGNOSIS — G894 Chronic pain syndrome: Secondary | ICD-10-CM | POA: Diagnosis not present

## 2021-03-03 DIAGNOSIS — U071 COVID-19: Secondary | ICD-10-CM | POA: Diagnosis not present

## 2021-03-03 DIAGNOSIS — R051 Acute cough: Secondary | ICD-10-CM | POA: Diagnosis not present

## 2021-03-07 ENCOUNTER — Emergency Department
Admission: EM | Admit: 2021-03-07 | Discharge: 2021-03-07 | Disposition: A | Discharge status: Home / Self Care | Payer: Medicare Other | Attending: Emergency Medicine | Admitting: Emergency Medicine

## 2021-03-07 ENCOUNTER — Emergency Department: Payer: Medicare Other

## 2021-03-07 ENCOUNTER — Encounter: Payer: Self-pay | Admitting: Emergency Medicine

## 2021-03-07 ENCOUNTER — Other Ambulatory Visit: Payer: Self-pay

## 2021-03-07 DIAGNOSIS — G63 Polyneuropathy in diseases classified elsewhere: Secondary | ICD-10-CM | POA: Diagnosis not present

## 2021-03-07 DIAGNOSIS — E038 Other specified hypothyroidism: Secondary | ICD-10-CM | POA: Diagnosis not present

## 2021-03-07 DIAGNOSIS — R52 Pain, unspecified: Secondary | ICD-10-CM | POA: Diagnosis not present

## 2021-03-07 DIAGNOSIS — J441 Chronic obstructive pulmonary disease with (acute) exacerbation: Secondary | ICD-10-CM | POA: Diagnosis not present

## 2021-03-07 DIAGNOSIS — J9611 Chronic respiratory failure with hypoxia: Secondary | ICD-10-CM | POA: Diagnosis not present

## 2021-03-07 DIAGNOSIS — R000 Tachycardia, unspecified: Secondary | ICD-10-CM | POA: Diagnosis not present

## 2021-03-07 DIAGNOSIS — Z85118 Personal history of other malignant neoplasm of bronchus and lung: Secondary | ICD-10-CM | POA: Insufficient documentation

## 2021-03-07 DIAGNOSIS — J1281 Pneumonia due to SARS-associated coronavirus: Secondary | ICD-10-CM | POA: Diagnosis not present

## 2021-03-07 DIAGNOSIS — R0789 Other chest pain: Secondary | ICD-10-CM | POA: Diagnosis not present

## 2021-03-07 DIAGNOSIS — R059 Cough, unspecified: Secondary | ICD-10-CM | POA: Diagnosis not present

## 2021-03-07 DIAGNOSIS — I5043 Acute on chronic combined systolic (congestive) and diastolic (congestive) heart failure: Secondary | ICD-10-CM | POA: Insufficient documentation

## 2021-03-07 DIAGNOSIS — R0602 Shortness of breath: Secondary | ICD-10-CM | POA: Diagnosis not present

## 2021-03-07 DIAGNOSIS — R079 Chest pain, unspecified: Secondary | ICD-10-CM

## 2021-03-07 DIAGNOSIS — Z79899 Other long term (current) drug therapy: Secondary | ICD-10-CM | POA: Diagnosis not present

## 2021-03-07 DIAGNOSIS — R531 Weakness: Secondary | ICD-10-CM | POA: Diagnosis not present

## 2021-03-07 DIAGNOSIS — Z87891 Personal history of nicotine dependence: Secondary | ICD-10-CM | POA: Diagnosis not present

## 2021-03-07 DIAGNOSIS — J189 Pneumonia, unspecified organism: Secondary | ICD-10-CM | POA: Diagnosis not present

## 2021-03-07 DIAGNOSIS — I110 Hypertensive heart disease with heart failure: Secondary | ICD-10-CM | POA: Diagnosis not present

## 2021-03-07 DIAGNOSIS — J449 Chronic obstructive pulmonary disease, unspecified: Secondary | ICD-10-CM | POA: Insufficient documentation

## 2021-03-07 DIAGNOSIS — R5381 Other malaise: Secondary | ICD-10-CM | POA: Diagnosis not present

## 2021-03-07 DIAGNOSIS — I4891 Unspecified atrial fibrillation: Secondary | ICD-10-CM | POA: Diagnosis not present

## 2021-03-07 DIAGNOSIS — I169 Hypertensive crisis, unspecified: Secondary | ICD-10-CM | POA: Diagnosis not present

## 2021-03-07 DIAGNOSIS — I509 Heart failure, unspecified: Secondary | ICD-10-CM | POA: Diagnosis not present

## 2021-03-07 DIAGNOSIS — R051 Acute cough: Secondary | ICD-10-CM | POA: Diagnosis not present

## 2021-03-07 DIAGNOSIS — E662 Morbid (severe) obesity with alveolar hypoventilation: Secondary | ICD-10-CM | POA: Diagnosis not present

## 2021-03-07 DIAGNOSIS — U071 COVID-19: Secondary | ICD-10-CM | POA: Diagnosis not present

## 2021-03-07 DIAGNOSIS — Z7401 Bed confinement status: Secondary | ICD-10-CM | POA: Diagnosis not present

## 2021-03-07 DIAGNOSIS — Z7901 Long term (current) use of anticoagulants: Secondary | ICD-10-CM | POA: Insufficient documentation

## 2021-03-07 LAB — CBC
HCT: 44.4 % (ref 36.0–46.0)
Hemoglobin: 15.5 g/dL — ABNORMAL HIGH (ref 12.0–15.0)
MCH: 36.2 pg — ABNORMAL HIGH (ref 26.0–34.0)
MCHC: 34.9 g/dL (ref 30.0–36.0)
MCV: 103.7 fL — ABNORMAL HIGH (ref 80.0–100.0)
Platelets: 177 10*3/uL (ref 150–400)
RBC: 4.28 MIL/uL (ref 3.87–5.11)
RDW: 14 % (ref 11.5–15.5)
WBC: 9.8 10*3/uL (ref 4.0–10.5)
nRBC: 0 % (ref 0.0–0.2)

## 2021-03-07 LAB — TROPONIN I (HIGH SENSITIVITY)
Troponin I (High Sensitivity): 29 ng/L — ABNORMAL HIGH (ref <18)
Troponin I (High Sensitivity): 32 ng/L — ABNORMAL HIGH (ref <18)

## 2021-03-07 LAB — BASIC METABOLIC PANEL
Anion gap: 7 (ref 5–15)
BUN: 13 mg/dL (ref 8–23)
CO2: 28 mmol/L (ref 22–32)
Calcium: 9.3 mg/dL (ref 8.9–10.3)
Chloride: 101 mmol/L (ref 98–111)
Creatinine, Ser: 0.78 mg/dL (ref 0.44–1.00)
GFR, Estimated: >60 mL/min (ref >60)
Glucose, Bld: 89 mg/dL (ref 70–99)
Potassium: 4 mmol/L (ref 3.5–5.1)
Sodium: 136 mmol/L (ref 135–145)

## 2021-03-07 LAB — BRAIN NATRIURETIC PEPTIDE: B Natriuretic Peptide: 386.7 pg/mL — ABNORMAL HIGH (ref 0.0–100.0)

## 2021-03-07 MED ORDER — METOPROLOL TARTRATE 25 MG PO TABS
12.5000 mg | ORAL_TABLET | Freq: Once | ORAL | Status: AC
  Administered 2021-03-07: 12.5 mg via ORAL
  Filled 2021-03-07: qty 1

## 2021-03-07 NOTE — ED Triage Notes (Signed)
Pt comes into the ED via [facility] from [facility].  Pt is 2 weeks out from COVID dx.  Pt diagnosed with pneumonia this past Sunday and is on abx.  Pt seen by NP by facility and she sent her here for chest congestion.  Pt has non-productive cough.  Pt in a-fib at 110-120 HR (h/x Afib)  97% 4L (baseline) 138/77 CBG 87 97.9 oral

## 2021-03-07 NOTE — ED Provider Notes (Signed)
[facility] Emergency Department Provider Note   ____________________________________________   Event Date/Time   First MD Initiated Contact with Patient 03/07/21 1637     (approximate)  I have reviewed the triage vital signs and the nursing notes.   HISTORY  Chief Complaint Shortness of Breath    HPI [name] is a 80 y.o. female who has a history of A. fib with RVR.  She also had COVID 2 weeks ago.  She also had pneumonia diagnosed.  She woke up this morning with little bit of chest tightness and shortness of breath.  Shortness of breath and chest pain have resolved.  Not sure exactly how long the lasted did not seem like it was very long.  She called the nurse and was sent here because her blood pressure was elevated.  Here her blood pressure has been in the last one 112/73.  She is afebrile.  Her troponins were slightly elevated but have not gone up appreciably.  Her BNP is 386.7 but her chest x-ray is clear her white count is normal electrolytes are normal.  She would like to go home.  I have given her her evening metoprolol on an effort to make sure her heart rate stays down.         Past Medical History:  Diagnosis Date   AAA (abdominal aortic aneurysm) (HCC)    Anticoagulant long-term use    Failed on Coumadin. On Xarelto   Anxiety    Anxiety and depression    Atrial fib/flutter, transient June 2012   Chronic lower back pain    COPD (chronic obstructive pulmonary disease) ([place])    DVT (deep venous thrombosis) ([place]) 2004   BLE   Esophageal dysmotility    Exertional dyspnea    Hiatal hernia    History of bronchitis    History of fibrocystic disease of breast    History of uterine fibroid    HTN (hypertension)    Hyperlipidemia    Hypothyroidism    Normal nuclear stress test 2012   May 2012   OA (osteoarthritis)    "knees; left shoulder"   OSA (obstructive sleep apnea)    "haven't been using my CPAP lately" (01/21/12)   Ovarian  mass    right benign   Ovarian mass    benign, right   PAD (peripheral artery disease) ([place])    Small cell carcinoma of lung ([place]) 2004   NON-SMALL CELL CARCINOMA OF THE LUNG, METASTATIC TO THE SUPRACLAVICULAR AND MEDIASTINAL LYMPH NODES; in remission    Patient Active Problem List   Diagnosis Date Noted   Physical deconditioning 06/16/2019   Hypokalemia    Acute on chronic combined systolic and diastolic CHF (congestive heart failure) ([place]) 09/15/2016   Hyperlipidemia    Chronic respiratory failure with hypoxia ([place]) 12/03/2014   Insomnia, chronic 01/27/2014   A-fib ([place])    Multiple thyroid nodules 10/17/2012   AAA (abdominal aortic aneurysm) ([place]) 05/11/2012   Bradycardia 01/23/2012   Hypothyroid 01/21/2012   HTN (hypertension) 01/21/2012   Edema 11/19/2011   Atrial flutter ([place]) 11/20/2010   Obstructive sleep apnea 10/16/2008   DVT 08/19/2008   COPD mixed type ([place]) 08/19/2008   NEOPLASM, MALIGNANT, LUNG, HX OF 06/25/7251   Diastolic heart failure, NYHA class 2 ([place]) 08/03/2008   Malignant neoplasm of lung ([place]) 06/23/2002    Past Surgical History:  Procedure Laterality Date   ABDOMINAL AORTIC ANEURYSM REPAIR  ~ 2010   stent graft  BLADDER SURGERY  ~ 2003   sling   BREAST BIOPSY  1960's   both breast's - benign   CARDIOVASCULAR STRESS TEST  2012   No ischemia   CATARACT EXTRACTION W/ INTRAOCULAR LENS  IMPLANT, BILATERAL Bilateral ~ 2009   REPLACEMENT TOTAL KNEE Left ~ 2008   left   THYROIDECTOMY  ~ 1964    Prior to Admission medications   Medication Sig Start Date End Date Taking? Authorizing Provider  acetaminophen (TYLENOL) 650 MG CR tablet [facility] Arthritis Pain 650 mg tablet,extended release  TAKE 1 TABLET BY MOUTH EVERYDAY AT BEDTIME    [provider]  albuterol (VENTOLIN HFA) 108 (90 Base) MCG/ACT inhaler TAKE 2 PUFFS EVERY 6 HOURS AS NEEDED FORSHORTNESS OF BREATH/WHEEZING 09/10/20   Baird Lyons D, MD  apixaban (ELIQUIS) 5 MG TABS tablet Take  1 tablet (5 mg total) by mouth 2 (two) times daily. 06/27/20   [place], Peter M, MD  CALCIUM-MAGNESIUM-ZINC PO Take 1 tablet by mouth daily.     [provider]  cefdinir (OMNICEF) 300 MG capsule Take 300 mg by mouth 2 (two) times daily. 10/25/20   [provider]  Cholecalciferol (VITAMIN D) 1000 UNITS capsule Take 1,000 Units by mouth daily.    [provider]  citalopram (CELEXA) 20 MG tablet Take 20 mg by mouth daily.    [provider]  digoxin (LANOXIN) 0.125 MG tablet Take 1 tablet (125 mcg total) by mouth daily. 12/21/20   [place], Peter M, MD  furosemide (LASIX) 40 MG tablet Take 2 tablets ( 80 mg ) daily 12/21/20   [place], Peter M, MD  gabapentin (NEURONTIN) 100 MG capsule Take 1 capsule by mouth 3 (three) times daily.    [provider]  levothyroxine (SYNTHROID, LEVOTHROID) 88 MCG tablet Take 88 mcg by mouth daily.    [provider]  megestrol (MEGACE) 40 MG tablet Take 40 mg by mouth daily.     [provider]  metolazone (ZAROXOLYN) 2.5 MG tablet Take 1 tablet (2.5 mg total) by mouth daily as needed (ONE DAILY PRN WEIGHT GAIN). USE AS NEEDED FOR WEIGHT GAIN 09/06/19   Deberah Pelton, NP  metoprolol tartrate (LOPRESSOR) 25 MG tablet TAKE 1/2 TABLET TWICE DAILY 11/08/20   [place], Peter M, MD  morphine (MSIR) 15 MG tablet Take 5 mg by mouth as needed. 10/13/16   [provider]  mupirocin ointment (BACTROBAN) 2 % mupirocin 2 % topical ointment  APPLY TO WOUNDS ON SKIN ONCE DAILY AT BANDAGE CHANGES    [provider]  OXYGEN Inhale 4 L into the lungs daily. Patient states she wears 2L at night and 4L when up and moving    [provider]  potassium chloride (KLOR-CON) 10 MEQ tablet Take 1 tablet (10 mEq total) by mouth 2 (two) times daily. 12/21/20   [place], Peter M, MD  temazepam (RESTORIL) 15 MG capsule TAKE 1 CAPSULE AT BEDTIME AS NEEDED  FOR  SLEEP 07/16/16   Baird Lyons D, MD  tiotropium (SPIRIVA  HANDIHALER) 18 MCG inhalation capsule Place 1 capsule (18 mcg total) into inhaler and inhale daily. 09/10/20   Deneise Lever, MD  tiZANidine (ZANAFLEX) 2 MG tablet Take 1 tablet (2 mg total) by mouth every 6 (six) hours as needed for muscle spasms. 11/30/19   Faustino Congress, NP  vitamin B-12 (CYANOCOBALAMIN) 100 MCG tablet Take 100 mcg by mouth daily.    [provider]    Allergies Patient has no known  allergies.  Family History  Problem Relation Age of Onset   Heart disease Father    Heart attack Father    Diabetes type II Mother    Diabetes Mother    Hypertension Mother    Heart disease Brother        before age 80   Heart disease Sister        See's Dr. Acie Fredrickson   Breast cancer Neg Hx     Social History Social History   Tobacco Use   Smoking status: Former    Packs/day: 1.00    Years: 40.00    Pack years: 40.00    Types: Cigarettes    Quit date: 06/23/2001    Years since quitting: 19.7   Smokeless tobacco: Never  Vaping Use   Vaping Use: Never used  Substance Use Topics   Alcohol use: Yes    Alcohol/week: 0.0 standard drinks    Comment: once a week (glass of wine)   Drug use: No    Review of Systems  Constitutional: No fever/chills Eyes: No visual changes. ENT: No sore throat. Cardiovascular: Denies chest pain. Respiratory: Denies shortness of breath. Gastrointestinal: No abdominal pain.  No nausea, no vomiting.  No diarrhea.  No constipation. Genitourinary: Negative for dysuria. Musculoskeletal: Negative for back pain. Skin: Negative for rash. Neurological: Negative for headaches, focal weakness   ____________________________________________   PHYSICAL EXAM:  VITAL SIGNS: ED Triage Vitals  Enc Vitals Group     BP 03/07/21 1214 103/78     Pulse Rate 03/07/21 1214 (!) 119     Resp 03/07/21 1214 20     Temp 03/07/21 1214 97.9 F (36.6 C)     Temp Source 03/07/21 1214 Oral     SpO2 03/07/21 1214 96 %     Weight 03/07/21 1217 199 lb  (90.3 kg)     Height 03/07/21 1217 5\' 2"  (1.575 m)     Head Circumference --      Peak Flow --      Pain Score 03/07/21 1217 0     Pain Loc --      Pain Edu? --      Excl. in [place]? --    Constitutional: Alert and oriented. Well appearing and in no acute distress. Eyes: Conjunctivae are normal.  Head: Atraumatic. Nose: No congestion/rhinnorhea. Mouth/Throat: Mucous membranes are moist.  Oropharynx non-erythematous. Neck: No stridor.  Cardiovascular: Occasionally rapid rate, irregular rhythm. Grossly normal heart sounds.  Good peripheral circulation. Respiratory: Normal respiratory effort.  No retractions. Lungs CTAB. Gastrointestinal: Soft and nontender. No distention. No abdominal bruits.  Musculoskeletal: No lower extremity tenderness bilateral 2+ edema.   Neurologic:  Normal speech and language. No gross focal neurologic deficits are appreciated.  Skin:  Skin is warm, dry and intact.  Some apparent venous stasis changes in the legs.   ____________________________________________   LABS (all labs ordered are listed, but only abnormal results are displayed)  Labs Reviewed  CBC - Abnormal; Notable for the following components:      Result Value   Hemoglobin 15.5 (*)    MCV 103.7 (*)    MCH 36.2 (*)    All other components within normal limits  BRAIN NATRIURETIC PEPTIDE - Abnormal; Notable for the following components:   B Natriuretic Peptide 386.7 (*)    All other components within normal limits  TROPONIN I (HIGH SENSITIVITY) - Abnormal; Notable for the following components:   Troponin I (High Sensitivity) 29 (*)    All other  components within normal limits  TROPONIN I (HIGH SENSITIVITY) - Abnormal; Notable for the following components:   Troponin I (High Sensitivity) 32 (*)    All other components within normal limits  BASIC METABOLIC PANEL   ____________________________________________  EKG  EKG read interpreted by me shows a flutter at rate of 103 left axis slight ST  segment depression in V3 through 6.  This is present in some of her old EKGs but not all of them.  Patient is chest pain-free. ____________________________________________  RADIOLOGY Gertha Calkin, personally viewed and evaluated these images (plain radiographs) as part of my medical decision making, as well as reviewing the written report by the radiologist.  ED MD interpretation: Chest x-ray read by radiology reviewed by me does show some cardiac enlargement.  No obvious pulmonary edema.  Official radiology report(s): DG Chest 2 View  Result Date: 03/07/2021 CLINICAL DATA:  Short of breath EXAM: CHEST - 2 VIEW COMPARISON:  11/18/2020 FINDINGS: Cardiac enlargement. Pulmonary vascularity normal. Negative for infiltrate or effusion. Negative for pulmonary edema Stent graft in the abdominal aorta and iliacs. IMPRESSION: Cardiac enlargement without heart failure. No superimposed acute abnormality. Electronically Signed   By: Franchot Gallo M.D.   On: 03/07/2021 13:14    ____________________________________________   PROCEDURES  Procedure(s) performed (including Critical Care):  Procedures   ____________________________________________   INITIAL IMPRESSION / ASSESSMENT AND PLAN / ED COURSE  ----------------------------------------- 7:09 PM on 03/07/2021 ----------------------------------------- Patient offered admission but declines.  Her heart rate is considerably lower most of the time occasionally will go up as high as 120 but comes back down fairly quickly.  She feels better and has not had any chest pain that she will admit to.  She wants to go home as I said.  She promises to come back if she has any further problems and will follow up with her doctor and cardiology.              ____________________________________________   FINAL CLINICAL IMPRESSION(S) / ED DIAGNOSES  Final diagnoses:  Chest pain of uncertain etiology     ED Discharge Orders     None         Note:  This document was prepared using Dragon voice recognition software and may include unintentional dictation errors.    Nena Polio, MD 03/07/21 [id number]

## 2021-03-07 NOTE — ED Notes (Signed)
Called EMS for transport back to [place]

## 2021-03-07 NOTE — Discharge Instructions (Addendum)
Please return if you have any further shortness of breath or chest tightness.  Please follow-up with your cardiologist at [facility] in the next day or 2.  If you call them and tell them that you were in the ER with some chest discomfort they should be able to see very quickly.  Please also follow-up with your regular medical doctor.

## 2021-03-20 DIAGNOSIS — E785 Hyperlipidemia, unspecified: Secondary | ICD-10-CM | POA: Diagnosis not present

## 2021-03-20 DIAGNOSIS — I4819 Other persistent atrial fibrillation: Secondary | ICD-10-CM | POA: Diagnosis not present

## 2021-03-20 DIAGNOSIS — C3490 Malignant neoplasm of unspecified part of unspecified bronchus or lung: Secondary | ICD-10-CM | POA: Diagnosis not present

## 2021-03-20 DIAGNOSIS — I872 Venous insufficiency (chronic) (peripheral): Secondary | ICD-10-CM | POA: Diagnosis not present

## 2021-03-20 DIAGNOSIS — I739 Peripheral vascular disease, unspecified: Secondary | ICD-10-CM | POA: Diagnosis not present

## 2021-03-20 DIAGNOSIS — I10 Essential (primary) hypertension: Secondary | ICD-10-CM | POA: Diagnosis not present

## 2021-03-20 DIAGNOSIS — Z9981 Dependence on supplemental oxygen: Secondary | ICD-10-CM | POA: Diagnosis not present

## 2021-03-20 DIAGNOSIS — Z7901 Long term (current) use of anticoagulants: Secondary | ICD-10-CM | POA: Diagnosis not present

## 2021-03-20 DIAGNOSIS — J449 Chronic obstructive pulmonary disease, unspecified: Secondary | ICD-10-CM | POA: Diagnosis not present

## 2021-03-20 DIAGNOSIS — Z23 Encounter for immunization: Secondary | ICD-10-CM | POA: Diagnosis not present

## 2021-03-20 DIAGNOSIS — I509 Heart failure, unspecified: Secondary | ICD-10-CM | POA: Diagnosis not present

## 2021-03-20 DIAGNOSIS — I480 Paroxysmal atrial fibrillation: Secondary | ICD-10-CM | POA: Diagnosis not present

## 2021-03-21 ENCOUNTER — Other Ambulatory Visit: Payer: Self-pay

## 2021-03-21 ENCOUNTER — Ambulatory Visit (INDEPENDENT_AMBULATORY_CARE_PROVIDER_SITE_OTHER): Payer: Medicare Other

## 2021-03-21 DIAGNOSIS — I5042 Chronic combined systolic (congestive) and diastolic (congestive) heart failure: Secondary | ICD-10-CM

## 2021-03-22 LAB — ECHOCARDIOGRAM COMPLETE
AR max vel: 2.21 cm2
AV Area VTI: 2.05 cm2
AV Area mean vel: 2.12 cm2
AV Mean grad: 4 mmHg
AV Peak grad: 6.3 mmHg
Ao pk vel: 1.25 m/s
Calc EF: 44.5 %
S' Lateral: 3.9 cm
Single Plane A2C EF: 46.3 %
Single Plane A4C EF: 44 %

## 2021-03-27 ENCOUNTER — Telehealth: Payer: Self-pay | Admitting: General Practice

## 2021-03-27 NOTE — Telephone Encounter (Signed)
Spoke to patient she was calling for echo results.Advised echo results were sent to mychart.Stated she lives in a assisted living she does not have access to [facility].Advised Dr.Jordan is out of office.I will call you back after he reviews.

## 2021-03-27 NOTE — Telephone Encounter (Signed)
Follow Up:     Patient wants to know if her Echo results are ready?

## 2021-04-03 NOTE — Telephone Encounter (Signed)
Called patient left message on personal voice mail Dr.Jordan reviewed echo.He advised echo stable.Keep appointment with him as planned.

## 2021-05-02 DIAGNOSIS — J9811 Atelectasis: Secondary | ICD-10-CM | POA: Diagnosis not present

## 2021-05-02 DIAGNOSIS — Q2546 Tortuous aortic arch: Secondary | ICD-10-CM | POA: Diagnosis not present

## 2021-05-02 DIAGNOSIS — R051 Acute cough: Secondary | ICD-10-CM | POA: Diagnosis not present

## 2021-05-02 DIAGNOSIS — R059 Cough, unspecified: Secondary | ICD-10-CM | POA: Diagnosis not present

## 2021-05-02 DIAGNOSIS — E039 Hypothyroidism, unspecified: Secondary | ICD-10-CM | POA: Diagnosis not present

## 2021-05-02 DIAGNOSIS — J441 Chronic obstructive pulmonary disease with (acute) exacerbation: Secondary | ICD-10-CM | POA: Diagnosis not present

## 2021-05-02 DIAGNOSIS — R0602 Shortness of breath: Secondary | ICD-10-CM | POA: Diagnosis not present

## 2021-05-02 DIAGNOSIS — I739 Peripheral vascular disease, unspecified: Secondary | ICD-10-CM | POA: Diagnosis not present

## 2021-05-02 DIAGNOSIS — R9431 Abnormal electrocardiogram [ECG] [EKG]: Secondary | ICD-10-CM | POA: Diagnosis not present

## 2021-05-02 DIAGNOSIS — I509 Heart failure, unspecified: Secondary | ICD-10-CM | POA: Diagnosis not present

## 2021-05-02 DIAGNOSIS — I10 Essential (primary) hypertension: Secondary | ICD-10-CM | POA: Diagnosis not present

## 2021-05-02 DIAGNOSIS — I4891 Unspecified atrial fibrillation: Secondary | ICD-10-CM | POA: Diagnosis not present

## 2021-05-02 DIAGNOSIS — F330 Major depressive disorder, recurrent, mild: Secondary | ICD-10-CM | POA: Diagnosis not present

## 2021-05-02 DIAGNOSIS — R000 Tachycardia, unspecified: Secondary | ICD-10-CM | POA: Diagnosis not present

## 2021-05-02 DIAGNOSIS — J9611 Chronic respiratory failure with hypoxia: Secondary | ICD-10-CM | POA: Diagnosis not present

## 2021-05-03 DIAGNOSIS — Z23 Encounter for immunization: Secondary | ICD-10-CM | POA: Diagnosis not present

## 2021-05-04 ENCOUNTER — Emergency Department: Payer: Medicare Other

## 2021-05-04 ENCOUNTER — Other Ambulatory Visit: Payer: Self-pay

## 2021-05-04 ENCOUNTER — Inpatient Hospital Stay
Admission: EM | Admit: 2021-05-04 | Discharge: 2021-05-09 | DRG: 871 | Disposition: A | Discharge status: Home Health | Payer: Medicare Other | Attending: Internal Medicine | Admitting: Internal Medicine

## 2021-05-04 DIAGNOSIS — I110 Hypertensive heart disease with heart failure: Secondary | ICD-10-CM | POA: Diagnosis present

## 2021-05-04 DIAGNOSIS — R0902 Hypoxemia: Secondary | ICD-10-CM | POA: Diagnosis not present

## 2021-05-04 DIAGNOSIS — J9811 Atelectasis: Secondary | ICD-10-CM | POA: Diagnosis not present

## 2021-05-04 DIAGNOSIS — J9601 Acute respiratory failure with hypoxia: Secondary | ICD-10-CM

## 2021-05-04 DIAGNOSIS — Z96652 Presence of left artificial knee joint: Secondary | ICD-10-CM | POA: Diagnosis present

## 2021-05-04 DIAGNOSIS — I739 Peripheral vascular disease, unspecified: Secondary | ICD-10-CM | POA: Diagnosis present

## 2021-05-04 DIAGNOSIS — Z6833 Body mass index (BMI) 33.0-33.9, adult: Secondary | ICD-10-CM

## 2021-05-04 DIAGNOSIS — Z8249 Family history of ischemic heart disease and other diseases of the circulatory system: Secondary | ICD-10-CM

## 2021-05-04 DIAGNOSIS — F0390 Unspecified dementia without behavioral disturbance: Secondary | ICD-10-CM | POA: Diagnosis present

## 2021-05-04 DIAGNOSIS — E872 Acidosis, unspecified: Secondary | ICD-10-CM | POA: Diagnosis present

## 2021-05-04 DIAGNOSIS — K224 Dyskinesia of esophagus: Secondary | ICD-10-CM | POA: Diagnosis present

## 2021-05-04 DIAGNOSIS — A419 Sepsis, unspecified organism: Secondary | ICD-10-CM | POA: Diagnosis not present

## 2021-05-04 DIAGNOSIS — R17 Unspecified jaundice: Secondary | ICD-10-CM | POA: Diagnosis present

## 2021-05-04 DIAGNOSIS — G4733 Obstructive sleep apnea (adult) (pediatric): Secondary | ICD-10-CM | POA: Diagnosis present

## 2021-05-04 DIAGNOSIS — R410 Disorientation, unspecified: Secondary | ICD-10-CM

## 2021-05-04 DIAGNOSIS — B9620 Unspecified Escherichia coli [E. coli] as the cause of diseases classified elsewhere: Secondary | ICD-10-CM | POA: Diagnosis present

## 2021-05-04 DIAGNOSIS — Z79899 Other long term (current) drug therapy: Secondary | ICD-10-CM

## 2021-05-04 DIAGNOSIS — M1990 Unspecified osteoarthritis, unspecified site: Secondary | ICD-10-CM | POA: Diagnosis present

## 2021-05-04 DIAGNOSIS — E890 Postprocedural hypothyroidism: Secondary | ICD-10-CM | POA: Diagnosis present

## 2021-05-04 DIAGNOSIS — I499 Cardiac arrhythmia, unspecified: Secondary | ICD-10-CM | POA: Diagnosis not present

## 2021-05-04 DIAGNOSIS — Z20822 Contact with and (suspected) exposure to covid-19: Secondary | ICD-10-CM | POA: Diagnosis present

## 2021-05-04 DIAGNOSIS — R5381 Other malaise: Secondary | ICD-10-CM | POA: Diagnosis present

## 2021-05-04 DIAGNOSIS — Z1612 Extended spectrum beta lactamase (ESBL) resistance: Secondary | ICD-10-CM | POA: Diagnosis present

## 2021-05-04 DIAGNOSIS — J449 Chronic obstructive pulmonary disease, unspecified: Secondary | ICD-10-CM | POA: Diagnosis present

## 2021-05-04 DIAGNOSIS — Z86718 Personal history of other venous thrombosis and embolism: Secondary | ICD-10-CM

## 2021-05-04 DIAGNOSIS — Z79891 Long term (current) use of opiate analgesic: Secondary | ICD-10-CM

## 2021-05-04 DIAGNOSIS — R4182 Altered mental status, unspecified: Secondary | ICD-10-CM | POA: Diagnosis not present

## 2021-05-04 DIAGNOSIS — I4891 Unspecified atrial fibrillation: Secondary | ICD-10-CM

## 2021-05-04 DIAGNOSIS — E785 Hyperlipidemia, unspecified: Secondary | ICD-10-CM | POA: Diagnosis present

## 2021-05-04 DIAGNOSIS — Z79890 Hormone replacement therapy (postmenopausal): Secondary | ICD-10-CM

## 2021-05-04 DIAGNOSIS — N390 Urinary tract infection, site not specified: Secondary | ICD-10-CM

## 2021-05-04 DIAGNOSIS — I491 Atrial premature depolarization: Secondary | ICD-10-CM | POA: Diagnosis not present

## 2021-05-04 DIAGNOSIS — R6520 Severe sepsis without septic shock: Secondary | ICD-10-CM | POA: Diagnosis present

## 2021-05-04 DIAGNOSIS — I712 Thoracic aortic aneurysm, without rupture, unspecified: Secondary | ICD-10-CM | POA: Diagnosis present

## 2021-05-04 DIAGNOSIS — G9341 Metabolic encephalopathy: Secondary | ICD-10-CM | POA: Diagnosis present

## 2021-05-04 DIAGNOSIS — R404 Transient alteration of awareness: Secondary | ICD-10-CM | POA: Diagnosis not present

## 2021-05-04 DIAGNOSIS — J9621 Acute and chronic respiratory failure with hypoxia: Secondary | ICD-10-CM

## 2021-05-04 DIAGNOSIS — K2289 Other specified disease of esophagus: Secondary | ICD-10-CM | POA: Diagnosis present

## 2021-05-04 DIAGNOSIS — E669 Obesity, unspecified: Secondary | ICD-10-CM | POA: Diagnosis present

## 2021-05-04 DIAGNOSIS — I959 Hypotension, unspecified: Secondary | ICD-10-CM | POA: Diagnosis not present

## 2021-05-04 DIAGNOSIS — G8929 Other chronic pain: Secondary | ICD-10-CM | POA: Diagnosis present

## 2021-05-04 DIAGNOSIS — I5042 Chronic combined systolic (congestive) and diastolic (congestive) heart failure: Secondary | ICD-10-CM | POA: Diagnosis present

## 2021-05-04 DIAGNOSIS — Z7901 Long term (current) use of anticoagulants: Secondary | ICD-10-CM

## 2021-05-04 DIAGNOSIS — Z923 Personal history of irradiation: Secondary | ICD-10-CM

## 2021-05-04 DIAGNOSIS — G9340 Encephalopathy, unspecified: Secondary | ICD-10-CM

## 2021-05-04 DIAGNOSIS — Z8679 Personal history of other diseases of the circulatory system: Secondary | ICD-10-CM

## 2021-05-04 DIAGNOSIS — R0600 Dyspnea, unspecified: Secondary | ICD-10-CM

## 2021-05-04 DIAGNOSIS — Z7401 Bed confinement status: Secondary | ICD-10-CM | POA: Diagnosis not present

## 2021-05-04 DIAGNOSIS — I10 Essential (primary) hypertension: Secondary | ICD-10-CM | POA: Diagnosis not present

## 2021-05-04 DIAGNOSIS — I5022 Chronic systolic (congestive) heart failure: Secondary | ICD-10-CM

## 2021-05-04 DIAGNOSIS — C3490 Malignant neoplasm of unspecified part of unspecified bronchus or lung: Secondary | ICD-10-CM | POA: Diagnosis not present

## 2021-05-04 DIAGNOSIS — Z833 Family history of diabetes mellitus: Secondary | ICD-10-CM

## 2021-05-04 DIAGNOSIS — Z85118 Personal history of other malignant neoplasm of bronchus and lung: Secondary | ICD-10-CM

## 2021-05-04 DIAGNOSIS — I517 Cardiomegaly: Secondary | ICD-10-CM | POA: Diagnosis not present

## 2021-05-04 DIAGNOSIS — Z9981 Dependence on supplemental oxygen: Secondary | ICD-10-CM

## 2021-05-04 DIAGNOSIS — Z87891 Personal history of nicotine dependence: Secondary | ICD-10-CM

## 2021-05-04 DIAGNOSIS — N3001 Acute cystitis with hematuria: Secondary | ICD-10-CM | POA: Diagnosis not present

## 2021-05-04 DIAGNOSIS — Z9221 Personal history of antineoplastic chemotherapy: Secondary | ICD-10-CM

## 2021-05-04 LAB — CBC WITH DIFFERENTIAL/PLATELET
Abs Immature Granulocytes: 0.03 10*3/uL (ref 0.00–0.07)
Basophils Absolute: 0 10*3/uL (ref 0.0–0.1)
Basophils Relative: 0 %
Eosinophils Absolute: 0 10*3/uL (ref 0.0–0.5)
Eosinophils Relative: 0 %
HCT: 48.6 % — ABNORMAL HIGH (ref 36.0–46.0)
Hemoglobin: 16.5 g/dL — ABNORMAL HIGH (ref 12.0–15.0)
Immature Granulocytes: 0 %
Lymphocytes Relative: 12 %
Lymphs Abs: 1.2 10*3/uL (ref 0.7–4.0)
MCH: 35.6 pg — ABNORMAL HIGH (ref 26.0–34.0)
MCHC: 34 g/dL (ref 30.0–36.0)
MCV: 105 fL — ABNORMAL HIGH (ref 80.0–100.0)
Monocytes Absolute: 1 10*3/uL (ref 0.1–1.0)
Monocytes Relative: 9 %
Neutro Abs: 8.2 10*3/uL — ABNORMAL HIGH (ref 1.7–7.7)
Neutrophils Relative %: 79 %
Platelets: 177 10*3/uL (ref 150–400)
RBC: 4.63 MIL/uL (ref 3.87–5.11)
RDW: 13.4 % (ref 11.5–15.5)
WBC: 10.5 10*3/uL (ref 4.0–10.5)
nRBC: 0 % (ref 0.0–0.2)

## 2021-05-04 LAB — COMPREHENSIVE METABOLIC PANEL
ALT: 19 U/L (ref 0–44)
AST: 48 U/L — ABNORMAL HIGH (ref 15–41)
Albumin: 3.9 g/dL (ref 3.5–5.0)
Alkaline Phosphatase: 44 U/L (ref 38–126)
Anion gap: 9 (ref 5–15)
BUN: 24 mg/dL — ABNORMAL HIGH (ref 8–23)
CO2: 27 mmol/L (ref 22–32)
Calcium: 9.4 mg/dL (ref 8.9–10.3)
Chloride: 99 mmol/L (ref 98–111)
Creatinine, Ser: 0.97 mg/dL (ref 0.44–1.00)
GFR, Estimated: 58 mL/min — ABNORMAL LOW (ref >60)
Glucose, Bld: 118 mg/dL — ABNORMAL HIGH (ref 70–99)
Potassium: 4.2 mmol/L (ref 3.5–5.1)
Sodium: 135 mmol/L (ref 135–145)
Total Bilirubin: 2.3 mg/dL — ABNORMAL HIGH (ref 0.3–1.2)
Total Protein: 8.1 g/dL (ref 6.5–8.1)

## 2021-05-04 LAB — URINALYSIS, COMPLETE (UACMP) WITH MICROSCOPIC
Bilirubin Urine: NEGATIVE
Glucose, UA: NEGATIVE mg/dL
Ketones, ur: 5 mg/dL — AB
Nitrite: POSITIVE — AB
Protein, ur: 100 mg/dL — AB
RBC / HPF: >50 RBC/hpf — ABNORMAL HIGH (ref 0–5)
Specific Gravity, Urine: 1.021 (ref 1.005–1.030)
WBC, UA: >50 WBC/hpf — ABNORMAL HIGH (ref 0–5)
pH: 5 (ref 5.0–8.0)

## 2021-05-04 LAB — PROTIME-INR
INR: 2.5 — ABNORMAL HIGH (ref 0.8–1.2)
Prothrombin Time: 26.6 seconds — ABNORMAL HIGH (ref 11.4–15.2)

## 2021-05-04 LAB — LACTIC ACID, PLASMA
Lactic Acid, Venous: 2 mmol/L (ref 0.5–1.9)
Lactic Acid, Venous: 2.1 mmol/L (ref 0.5–1.9)

## 2021-05-04 LAB — APTT: aPTT: 33 seconds (ref 24–36)

## 2021-05-04 LAB — RESP PANEL BY RT-PCR (FLU A&B, COVID) ARPGX2
Influenza A by PCR: NEGATIVE
Influenza B by PCR: NEGATIVE
SARS Coronavirus 2 by RT PCR: NEGATIVE

## 2021-05-04 MED ORDER — VITAMIN B-12 100 MCG PO TABS
100.0000 ug | ORAL_TABLET | Freq: Every day | ORAL | Status: DC
  Administered 2021-05-05 – 2021-05-09 (×5): 100 ug via ORAL
  Filled 2021-05-04 (×7): qty 1

## 2021-05-04 MED ORDER — ACETAMINOPHEN 325 MG PO TABS
650.0000 mg | ORAL_TABLET | Freq: Four times a day (QID) | ORAL | Status: DC | PRN
  Administered 2021-05-05: 650 mg via ORAL
  Filled 2021-05-04: qty 2

## 2021-05-04 MED ORDER — LACTATED RINGERS IV BOLUS (SEPSIS): 1000.0000 mL | Freq: Once | INTRAVENOUS | Status: DC

## 2021-05-04 MED ORDER — TEMAZEPAM 15 MG PO CAPS
15.0000 mg | ORAL_CAPSULE | Freq: Every evening | ORAL | Status: DC | PRN
  Administered 2021-05-07: 15 mg via ORAL
  Filled 2021-05-04: qty 1

## 2021-05-04 MED ORDER — ACETAMINOPHEN 650 MG RE SUPP: 650.0000 mg | Freq: Four times a day (QID) | RECTAL | Status: DC | PRN

## 2021-05-04 MED ORDER — VANCOMYCIN HCL IN DEXTROSE 1-5 GM/200ML-% IV SOLN
1000.0000 mg | Freq: Once | INTRAVENOUS | Status: AC
  Administered 2021-05-04: 1000 mg via INTRAVENOUS
  Filled 2021-05-04: qty 200

## 2021-05-04 MED ORDER — LACTATED RINGERS IV BOLUS (SEPSIS)
1000.0000 mL | Freq: Once | INTRAVENOUS | Status: AC
  Administered 2021-05-04: 1000 mL via INTRAVENOUS

## 2021-05-04 MED ORDER — CITALOPRAM HYDROBROMIDE 20 MG PO TABS
20.0000 mg | ORAL_TABLET | Freq: Every day | ORAL | Status: DC
  Administered 2021-05-05 – 2021-05-09 (×5): 20 mg via ORAL
  Filled 2021-05-04 (×5): qty 1

## 2021-05-04 MED ORDER — SODIUM CHLORIDE 0.9 % IV SOLN
2.0000 g | Freq: Once | INTRAVENOUS | Status: AC
  Administered 2021-05-04: 2 g via INTRAVENOUS
  Filled 2021-05-04: qty 2

## 2021-05-04 MED ORDER — MEGESTROL ACETATE 20 MG PO TABS
40.0000 mg | ORAL_TABLET | Freq: Two times a day (BID) | ORAL | Status: DC
  Administered 2021-05-05 – 2021-05-09 (×9): 40 mg via ORAL
  Filled 2021-05-04 (×12): qty 2

## 2021-05-04 MED ORDER — ONDANSETRON HCL 4 MG PO TABS: 4.0000 mg | ORAL_TABLET | Freq: Four times a day (QID) | ORAL | Status: DC | PRN

## 2021-05-04 MED ORDER — LEVOTHYROXINE SODIUM 88 MCG PO TABS
88.0000 ug | ORAL_TABLET | Freq: Every day | ORAL | Status: DC
  Administered 2021-05-05 – 2021-05-09 (×5): 88 ug via ORAL
  Filled 2021-05-04 (×9): qty 1

## 2021-05-04 MED ORDER — DIGOXIN 125 MCG PO TABS
125.0000 ug | ORAL_TABLET | Freq: Every day | ORAL | Status: DC
  Administered 2021-05-05 – 2021-05-09 (×5): 125 ug via ORAL
  Filled 2021-05-04 (×7): qty 1

## 2021-05-04 MED ORDER — ALBUTEROL SULFATE (2.5 MG/3ML) 0.083% IN NEBU: 2.5000 mg | INHALATION_SOLUTION | Freq: Four times a day (QID) | RESPIRATORY_TRACT | Status: DC | PRN

## 2021-05-04 MED ORDER — FUROSEMIDE 40 MG PO TABS
40.0000 mg | ORAL_TABLET | Freq: Two times a day (BID) | ORAL | Status: DC
  Administered 2021-05-05 – 2021-05-09 (×9): 40 mg via ORAL
  Filled 2021-05-04 (×2): qty 1
  Filled 2021-05-04: qty 2
  Filled 2021-05-04: qty 1
  Filled 2021-05-04: qty 2
  Filled 2021-05-04 (×2): qty 1
  Filled 2021-05-04 (×2): qty 2

## 2021-05-04 MED ORDER — TIOTROPIUM BROMIDE MONOHYDRATE 18 MCG IN CAPS
18.0000 ug | ORAL_CAPSULE | Freq: Every day | RESPIRATORY_TRACT | Status: DC
  Administered 2021-05-05 – 2021-05-09 (×5): 18 ug via RESPIRATORY_TRACT
  Filled 2021-05-04 (×2): qty 5

## 2021-05-04 MED ORDER — ONDANSETRON HCL 4 MG/2ML IJ SOLN: 4.0000 mg | Freq: Four times a day (QID) | INTRAMUSCULAR | Status: DC | PRN

## 2021-05-04 MED ORDER — LACTATED RINGERS IV SOLN: INTRAVENOUS | Status: AC

## 2021-05-04 MED ORDER — SODIUM CHLORIDE 0.9 % IV SOLN
2.0000 g | INTRAVENOUS | Status: DC
  Administered 2021-05-05 – 2021-05-07 (×3): 2 g via INTRAVENOUS
  Filled 2021-05-04: qty 2
  Filled 2021-05-04 (×4): qty 20

## 2021-05-04 MED ORDER — GUAIFENESIN ER 600 MG PO TB12
1200.0000 mg | ORAL_TABLET | Freq: Two times a day (BID) | ORAL | Status: DC
  Administered 2021-05-05 – 2021-05-09 (×9): 1200 mg via ORAL
  Filled 2021-05-04 (×9): qty 2

## 2021-05-04 MED ORDER — SODIUM CHLORIDE 0.9 % IV SOLN
500.0000 mg | INTRAVENOUS | Status: DC
  Administered 2021-05-04 – 2021-05-05 (×2): 500 mg via INTRAVENOUS
  Filled 2021-05-04 (×3): qty 500

## 2021-05-04 MED ORDER — APIXABAN 5 MG PO TABS
5.0000 mg | ORAL_TABLET | Freq: Two times a day (BID) | ORAL | Status: DC
  Administered 2021-05-05 – 2021-05-09 (×10): 5 mg via ORAL
  Filled 2021-05-04 (×10): qty 1

## 2021-05-04 MED ORDER — POTASSIUM CHLORIDE CRYS ER 10 MEQ PO TBCR
10.0000 meq | EXTENDED_RELEASE_TABLET | Freq: Two times a day (BID) | ORAL | Status: DC
  Administered 2021-05-05 – 2021-05-09 (×9): 10 meq via ORAL
  Filled 2021-05-04 (×12): qty 1

## 2021-05-04 NOTE — H&P (Addendum)
History and Physical    [name] TJQ:[id number] DOB: [birth year] DOA: 05/04/2021  PCP: Burnard Bunting, MD   Patient coming from: home  I have personally briefly reviewed patient's relevant medical records in [place]  Chief Complaint: Altered mental status, weakness  HPI: [name] is a 80 y.o. female with medical history significant for A. fib on Eliquis, chronic combined CHF (EF 35 to 40% 03/21/2021), COPD on home O2 at 3 L, anxiety, HTN, hypothyroidism, small cell lung cancer, OSA, PVD who [facility] with altered mental status preceded by 3 to 4 days of increasing weakness.  She was hypoxic with EMS on her home dose of 3 L and was in rapid A. fib.  Patient was history is limited due to altered mental status and is taken from ER records.  ED course: On arrival she was afebrile, tachycardic to 137, tachypneic to 32 with O2 sat of 94% on 6 L BP 123/85, temp 98.6 Blood work WBC 10.5, hemoglobin 16.5, lactic acid 2.0> 2.1 CMP mostly unremarkable except for slightly elevated BUN of 24, AST 48 and total bili 2.3 COVID and flu negative Urinalysis strongly positive  EKG, personally viewed and interpreted A. fib with RVR at 130 with no acute ST-T wave changes  Imaging: Chest x-ray: Pulmonary hypoinflation, mild left basilar atelectasis or infiltrate.  Stable cardiomegaly  Patient was started on sepsis fluid bolus and broad-spectrum antibiotics cefepime and vancomycin to cover for possible pneumonia.  Urine subsequently resulted.  Hospitalist consulted for admission for sepsis secondary to UTI    Review of Systems: Unable to obtain due to altered mental status  Past Medical History:  Diagnosis Date   AAA (abdominal aortic aneurysm) ([place])    Anticoagulant long-term use    Failed on Coumadin. On Xarelto   Anxiety    Anxiety and depression    Atrial fib/flutter, transient June 2012   Chronic lower back pain    COPD (chronic obstructive pulmonary  disease) ([place])    DVT (deep venous thrombosis) ([place]) 2004   BLE   Esophageal dysmotility    Exertional dyspnea    Hiatal hernia    History of bronchitis    History of fibrocystic disease of breast    History of uterine fibroid    HTN (hypertension)    Hyperlipidemia    Hypothyroidism    Normal nuclear stress test 2012   May 2012   OA (osteoarthritis)    "knees; left shoulder"   OSA (obstructive sleep apnea)    "haven't been using my CPAP lately" (01/21/12)   Ovarian mass    right benign   Ovarian mass    benign, right   PAD (peripheral artery disease) ([place])    Small cell carcinoma of lung ([place]) 2004   NON-SMALL CELL CARCINOMA OF THE LUNG, METASTATIC TO THE SUPRACLAVICULAR AND MEDIASTINAL LYMPH NODES; in remission    Past Surgical History:  Procedure Laterality Date   ABDOMINAL AORTIC ANEURYSM REPAIR  ~ 2010   stent graft   BLADDER SURGERY  ~ 2003   sling   BREAST BIOPSY  1960's   both breast's - benign   CARDIOVASCULAR STRESS TEST  2012   No ischemia   CATARACT EXTRACTION W/ INTRAOCULAR LENS  IMPLANT, BILATERAL Bilateral ~ 2009   REPLACEMENT TOTAL KNEE Left ~ 2008   left   THYROIDECTOMY  ~ 1964     reports that she quit smoking about 19 years ago. Her smoking use included cigarettes. She  has a 40.00 pack-year smoking history. She has never used smokeless tobacco. She reports current alcohol use. She reports that she does not use drugs.  No Known Allergies  Family History  Problem Relation Age of Onset   Heart disease Father    Heart attack Father    Diabetes type II Mother    Diabetes Mother    Hypertension Mother    Heart disease Brother        before age 67   Heart disease Sister        See's Dr. Acie Fredrickson   Breast cancer Neg Hx       Prior to Admission medications   Medication Sig Start Date End Date Taking? Authorizing Provider  apixaban (ELIQUIS) 5 MG TABS tablet Take 1 tablet (5 mg total) by mouth 2 (two) times daily. 06/27/20  Yes [place], Peter M, MD   Cholecalciferol (VITAMIN D) 1000 UNITS capsule Take 1,000 Units by mouth daily.   Yes [provider]  citalopram (CELEXA) 20 MG tablet Take 20 mg by mouth daily.   Yes [provider]  digoxin (LANOXIN) 0.125 MG tablet Take 1 tablet (125 mcg total) by mouth daily. 12/21/20  Yes [place], Peter M, MD  furosemide (LASIX) 40 MG tablet Take 2 tablets ( 80 mg ) daily 12/21/20  Yes [place], Peter M, MD  gabapentin (NEURONTIN) 100 MG capsule Take 1 capsule by mouth 3 (three) times daily.   Yes [provider]  guaiFENesin (MUCINEX) 600 MG 12 hr tablet Take 1,200 mg by mouth 2 (two) times daily.   Yes [provider]  levothyroxine (SYNTHROID, LEVOTHROID) 88 MCG tablet Take 88 mcg by mouth daily.   Yes [provider]  megestrol (MEGACE) 40 MG tablet Take 40 mg by mouth 2 (two) times daily.   Yes [provider]  metoprolol tartrate (LOPRESSOR) 25 MG tablet TAKE 1/2 TABLET TWICE DAILY Patient taking differently: Take 6.25 mg by mouth 2 (two) times daily. TAKE 1/4 TABLET TWICE DAILY 11/08/20  Yes [place], Peter M, MD  morphine (MSIR) 15 MG tablet Take 5 mg by mouth as needed. 10/13/16  Yes [provider]  potassium chloride (KLOR-CON) 10 MEQ tablet Take 1 tablet (10 mEq total) by mouth 2 (two) times daily. 12/21/20  Yes [place], Peter M, MD  tiotropium (SPIRIVA HANDIHALER) 18 MCG inhalation capsule Place 1 capsule (18 mcg total) into inhaler and inhale daily. 09/10/20  Yes Young, Tarri Fuller D, MD  vitamin B-12 (CYANOCOBALAMIN) 100 MCG tablet Take 100 mcg by mouth daily.   Yes [provider]  acetaminophen (TYLENOL) 650 MG CR tablet [place] Arthritis Pain 650 mg tablet,extended release  TAKE 1 TABLET BY MOUTH EVERYDAY AT BEDTIME Patient not taking: No sig reported    [provider]  albuterol (VENTOLIN HFA) 108 (90 Base) MCG/ACT inhaler TAKE 2 PUFFS EVERY 6 HOURS AS NEEDED FORSHORTNESS OF BREATH/WHEEZING 09/10/20   Baird Lyons D, MD   CALCIUM-MAGNESIUM-ZINC PO Take 1 tablet by mouth daily.  Patient not taking: No sig reported    [provider]  cefdinir (OMNICEF) 300 MG capsule Take 300 mg by mouth 2 (two) times daily. Patient not taking: No sig reported 10/25/20   [provider]  metolazone (ZAROXOLYN) 2.5 MG tablet Take 1 tablet (2.5 mg total) by mouth daily as needed (ONE DAILY PRN WEIGHT GAIN). USE AS NEEDED FOR WEIGHT GAIN 09/06/19   Deberah Pelton, NP  mupirocin ointment (BACTROBAN) 2 % mupirocin 2 % topical ointment  APPLY TO WOUNDS ON SKIN ONCE DAILY AT BANDAGE CHANGES    [provider]  OXYGEN Inhale 4 L into the lungs daily. Patient states she wears 2L at night and 4L when up and moving    [provider]  temazepam (RESTORIL) 15 MG capsule TAKE 1 CAPSULE AT BEDTIME AS NEEDED  FOR  SLEEP 07/16/16   Young, Kasandra Knudsen, MD  tiZANidine (ZANAFLEX) 2 MG tablet Take 1 tablet (2 mg total) by mouth every 6 (six) hours as needed for muscle spasms. Patient not taking: No sig reported 11/30/19   Faustino Congress, NP    Physical Exam: Vitals:   05/04/21 1900 05/04/21 2000 05/04/21 2100 05/04/21 2130  BP: 136/80 (!) 101/59 117/79 117/80  Pulse: (!) 122 97 (!) 124 (!) 124  Resp: (!) 29 (!) 25 18 20   Temp:      TempSrc:      SpO2: 94% 96% 94% 95%  Weight:       Constitutional: Lethargic, arousable but will readily fall back asleep.  HEENT:      Head: Normocephalic and atraumatic.         Eyes: PERLA, EOMI, Conjunctivae are normal. Sclera is non-icteric.       Mouth/Throat: Mucous membranes are moist.       Neck: Supple with no signs of meningismus. Cardiovascular: Tachycardic and irregular. No murmurs, gallops, or rubs. 2+ symmetrical distal pulses are present . No JVD. No  LE edema Respiratory: Respiratory effort increased.  Tachypneic.  Diminished bilaterally Gastrointestinal: Soft, non tender, non distended. Positive bowel sounds.  Genitourinary: No CVA  tenderness. Musculoskeletal: Nontender with normal range of motion in all extremities. No cyanosis, or erythema of extremities. Neurologic:  Face is symmetric. Moving all extremities. No gross focal neurologic deficits . Skin: Skin is warm, dry.  No rash or ulcers Psychiatric: Unable to assess due to lethargy and altered mental status  Labs on Admission: I have personally reviewed following labs and imaging studies  CBC: Recent Labs  Lab 05/04/21 1816  WBC 10.5  NEUTROABS 8.2*  HGB 16.5*  HCT 48.6*  MCV 105.0*  PLT 314   Basic Metabolic Panel: Recent Labs  Lab 05/04/21 1816  NA 135  K 4.2  CL 99  CO2 27  GLUCOSE 118*  BUN 24*  CREATININE 0.97  CALCIUM 9.4   GFR: Estimated Creatinine Clearance: 47.4 mL/min (by C-G formula based on SCr of 0.97 mg/dL). Liver Function Tests: Recent Labs  Lab 05/04/21 1816  AST 48*  ALT 19  ALKPHOS 44  BILITOT 2.3*  PROT 8.1  ALBUMIN 3.9   No results for input(s): LIPASE, AMYLASE in the last 168 hours. No results for input(s): AMMONIA in the last 168 hours. Coagulation Profile: Recent Labs  Lab 05/04/21 1816  INR 2.5*   Cardiac Enzymes: No results for input(s): CKTOTAL, CKMB, CKMBINDEX, TROPONINI in the last 168 hours. BNP (last 3 results) No results for input(s): PROBNP in the last 8760 hours. HbA1C: No results for input(s): HGBA1C in the last 72 hours. CBG: No results for input(s): GLUCAP in the last 168 hours. Lipid Profile: No results for input(s): CHOL, HDL, LDLCALC, TRIG, CHOLHDL, LDLDIRECT in the last 72 hours. Thyroid Function Tests: No results for input(s): TSH, T4TOTAL, FREET4, T3FREE, THYROIDAB in the last 72 hours. Anemia Panel: No results for input(s): VITAMINB12, FOLATE, FERRITIN, TIBC, IRON, RETICCTPCT in the last 72 hours. Urine analysis:    Component Value Date/Time   COLORURINE AMBER (A) 05/04/2021 1815  APPEARANCEUR CLOUDY (A) 05/04/2021 1815   LABSPEC 1.021 05/04/2021 1815   PHURINE 5.0  05/04/2021 1815   GLUCOSEU NEGATIVE 05/04/2021 1815   HGBUR LARGE (A) 05/04/2021 1815   BILIRUBINUR NEGATIVE 05/04/2021 1815   KETONESUR 5 (A) 05/04/2021 1815   PROTEINUR 100 (A) 05/04/2021 1815   UROBILINOGEN 1.0 05/29/2009 2253   NITRITE POSITIVE (A) 05/04/2021 1815   LEUKOCYTESUR LARGE (A) 05/04/2021 1815    Radiological Exams on Admission: DG Chest Port 1 View  Result Date: 05/04/2021 CLINICAL DATA:  Sepsis EXAM: PORTABLE CHEST 1 VIEW COMPARISON:  03/07/2021 FINDINGS: Lung volumes are small and pulmonary insufflation has diminished since prior examination. There is diffuse interstitial thickening which may relate to pulmonary hypoinflation. Superimposed mild left basilar atelectasis or infiltrate. No pneumothorax or pleural effusion. Cardiomegaly is stable when accounting for poor pulmonary insufflation. Superior mediastinal widening, again, likely relates to poor pulmonary insufflation. No acute bone abnormality. IMPRESSION: Pulmonary hypoinflation. Mild left basilar atelectasis or infiltrate. Stable cardiomegaly. Electronically Signed   By: Fidela Salisbury M.D.   On: 05/04/2021 19:06    Assessment/Plan    Sepsis ([place]) -IV fluid resuscitation with close monitoring for fluid overload in view of combined CHF - Suspect mostly related to UTI however patient was very tachypneic and chest x-ray showing possibility of infiltrate - Treat UTI as outlined below.  Treat for possible pneumonia and de-escalate as necessary     UTI (urinary tract infection) - Urinalysis strongly consistent with UTI - Rocephin - Follow cultures    Atrial fibrillation with RVR (HCC) Chronic anticoagulation - A. fib with rate of 130, improving with fluids - Likely related to sepsis - Continue to treat sepsis - Continue Eliquis for stroke prevention - Continue digoxin, metoprolol as blood pressure will tolerate    Acute on chronic respiratory failure with hypoxia (HCC) Possible pneumonia - Patient requiring  6 L to maintain sats in the mid 90s.  Uses 3 L at baseline - Chest x-ray with mild left basilar atelectasis or infiltrate - Low suspicion for PE as etiology as patient on chronic anticoagulation with Eliquis - We will treat as pneumonia as well and de-escalate as needed - Rocephin and azithromycin - Supplemental oxygen    Acute metabolic encephalopathy - Secondary to sepsis - Neurologic checks and aspiration precautions    COPD (chronic obstructive pulmonary disease) (HCC) - No overt wheezing heard on clinical exam - DuoNebs as needed - Continue home inhalers    Chronic combined systolic and diastolic CHF (congestive heart failure) (HCC) - Appears mostly euvolemic - Last echo showed EF 35 to 40%, indeterminate diastolic parameters - Monitor for fluid overload given IV fluid resuscitation and management of sepsis - Continue digoxin, metoprolol, furosemide - Daily weights with intake and output monitoring  Hyperbilirubinemia - Bilirubin of 2.3, up from 1.5 five months prior - Suspecting chronic and related to chronic congestion from CHF but could be related to sepsis - Continue to monitor  History of small cell carcinoma of lung (HCC) - S/p XRT/chemotherapy  Thoracic aortic aneurysm AAA s/p stent - Had CTA chest/aorta 02/14/2020 with saccular aneurysm measuring 3.3 cm and stable.  Stent graft stable   DVT prophylaxis: Eliquis  code Status: full code  Family Communication:  none  Disposition Plan: Back to previous home environment Consults called: none  Status:At the time of admission, it appears that the appropriate admission status for this patient is INPATIENT. This is judged to be reasonable and necessary in order to provide the required intensity  of service to ensure the patient's safety given the presenting symptoms, physical exam findings, and initial radiographic and laboratory data in the context of their  Comorbid conditions.   Patient requires inpatient status due to  high intensity of service, high risk for further deterioration and high frequency of surveillance required.   I certify that at the point of admission it is my clinical judgment that the patient will require inpatient hospital care spanning beyond [place] MD [facility]   05/04/2021, 9:58 PM

## 2021-05-04 NOTE — Consult Note (Signed)
CODE SEPSIS - PHARMACY COMMUNICATION  **Broad Spectrum Antibiotics should be administered within 1 hour of Sepsis diagnosis**  Time Code Sepsis Called/Page Received: 1829  Antibiotics Ordered: Vancomycin and cefepime  Time of 1st antibiotic administration: 1847  Additional action taken by pharmacy: none  If necessary, Name of Provider/Nurse Contacted: n/a    Charnika Herbst Rodriguez-Guzman PharmD, BCPS 05/04/2021 6:48 PM

## 2021-05-04 NOTE — Sepsis Progress Note (Signed)
Sepsis protocol is being monitored by eLink. 

## 2021-05-04 NOTE — Consult Note (Signed)
PHARMACY -  BRIEF ANTIBIOTIC NOTE   Pharmacy has received consult(s) for Cefepime and Vancomycin from an ED provider.  The patient's profile has been reviewed for ht/wt/allergies/indication/available labs.    One time order(s) placed for Vancomycin and Cefepime  Further antibiotics/pharmacy consults should be ordered by admitting physician if indicated.                       Thank you, Jazlyn Tippens Rodriguez-Guzman PharmD, BCPS 05/04/2021 6:47 PM

## 2021-05-04 NOTE — ED Triage Notes (Signed)
Patient to ED via EMS from [place] [facility] for c/o AMS. Per EMS, staff states patient has had increasing weakness x 3-4 days. Patient was hypoxic on scene on her home dose of 3LNC. Afib RVR per EMS.

## 2021-05-04 NOTE — ED Notes (Signed)
Dr. Cheri Fowler notified of critical labs.

## 2021-05-04 NOTE — ED Provider Notes (Signed)
[facility] Emergency Department Provider Note   ____________________________________________   Event Date/Time   First MD Initiated Contact with Patient 05/04/21 1752     (approximate)  I have reviewed the triage vital signs and the nursing notes.   HISTORY  Chief Complaint Altered Mental Status    HPI [name] is a 80 y.o. female who presents via EMS from her assisted living facility for altered mental status.  Staff noted patient to have increasing weakness over the last 3-4 days.  Patient also wears 3 L nasal cannula all the time but had to be increased to 5 L nasal cannula due to O2 sat of 82%.  EMS also found patient to be in A. fib RVR.  Patient has significant dementia and cannot participate in history/review of systems          Past Medical History:  Diagnosis Date   AAA (abdominal aortic aneurysm) ([place])    Anticoagulant long-term use    Failed on Coumadin. On Xarelto   Anxiety    Anxiety and depression    Atrial fib/flutter, transient June 2012   Chronic lower back pain    COPD (chronic obstructive pulmonary disease) ([place])    DVT (deep venous thrombosis) ([place]) 2004   BLE   Esophageal dysmotility    Exertional dyspnea    Hiatal hernia    History of bronchitis    History of fibrocystic disease of breast    History of uterine fibroid    HTN (hypertension)    Hyperlipidemia    Hypothyroidism    Normal nuclear stress test 2012   May 2012   OA (osteoarthritis)    "knees; left shoulder"   OSA (obstructive sleep apnea)    "haven't been using my CPAP lately" (01/21/12)   Ovarian mass    right benign   Ovarian mass    benign, right   PAD (peripheral artery disease) ([place])    Small cell carcinoma of lung ([place]) 2004   NON-SMALL CELL CARCINOMA OF THE LUNG, METASTATIC TO THE SUPRACLAVICULAR AND MEDIASTINAL LYMPH NODES; in remission    Patient Active Problem List   Diagnosis Date Noted   Physical deconditioning 06/16/2019    Hypokalemia    Acute on chronic combined systolic and diastolic CHF (congestive heart failure) ([place]) 09/15/2016   Hyperlipidemia    Chronic respiratory failure with hypoxia ([place]) 12/03/2014   Insomnia, chronic 01/27/2014   A-fib ([place])    Multiple thyroid nodules 10/17/2012   AAA (abdominal aortic aneurysm) 05/11/2012   Bradycardia 01/23/2012   Hypothyroid 01/21/2012   HTN (hypertension) 01/21/2012   Edema 11/19/2011   Atrial flutter ([place]) 11/20/2010   Obstructive sleep apnea 10/16/2008   DVT 08/19/2008   COPD mixed type ([place]) 08/19/2008   NEOPLASM, MALIGNANT, LUNG, HX OF 19/75/8832   Diastolic heart failure, NYHA class 2 ([place]) 08/03/2008   Malignant neoplasm of lung ([place]) 06/23/2002    Past Surgical History:  Procedure Laterality Date   ABDOMINAL AORTIC ANEURYSM REPAIR  ~ 2010   stent graft   BLADDER SURGERY  ~ 2003   sling   BREAST BIOPSY  1960's   both breast's - benign   CARDIOVASCULAR STRESS TEST  2012   No ischemia   CATARACT EXTRACTION W/ INTRAOCULAR LENS  IMPLANT, BILATERAL Bilateral ~ 2009   REPLACEMENT TOTAL KNEE Left ~ 2008   left   THYROIDECTOMY  ~ 1964    Prior to Admission medications   Medication Sig Start Date End Date  Taking? Authorizing Provider  apixaban (ELIQUIS) 5 MG TABS tablet Take 1 tablet (5 mg total) by mouth 2 (two) times daily. 06/27/20  Yes [place], Peter M, MD  Cholecalciferol (VITAMIN D) 1000 UNITS capsule Take 1,000 Units by mouth daily.   Yes [provider]  citalopram (CELEXA) 20 MG tablet Take 20 mg by mouth daily.   Yes [provider]  digoxin (LANOXIN) 0.125 MG tablet Take 1 tablet (125 mcg total) by mouth daily. 12/21/20  Yes [place], Peter M, MD  furosemide (LASIX) 40 MG tablet Take 2 tablets ( 80 mg ) daily 12/21/20  Yes [place], Peter M, MD  gabapentin (NEURONTIN) 100 MG capsule Take 1 capsule by mouth 3 (three) times daily.   Yes [provider]  guaiFENesin (MUCINEX) 600 MG 12 hr tablet Take 1,200 mg by mouth 2  (two) times daily.   Yes [provider]  levothyroxine (SYNTHROID, LEVOTHROID) 88 MCG tablet Take 88 mcg by mouth daily.   Yes [provider]  megestrol (MEGACE) 40 MG tablet Take 40 mg by mouth 2 (two) times daily.   Yes [provider]  metoprolol tartrate (LOPRESSOR) 25 MG tablet TAKE 1/2 TABLET TWICE DAILY Patient taking differently: Take 6.25 mg by mouth 2 (two) times daily. TAKE 1/4 TABLET TWICE DAILY 11/08/20  Yes [place], Peter M, MD  morphine (MSIR) 15 MG tablet Take 5 mg by mouth as needed. 10/13/16  Yes [provider]  potassium chloride (KLOR-CON) 10 MEQ tablet Take 1 tablet (10 mEq total) by mouth 2 (two) times daily. 12/21/20  Yes [place], Peter M, MD  tiotropium (SPIRIVA HANDIHALER) 18 MCG inhalation capsule Place 1 capsule (18 mcg total) into inhaler and inhale daily. 09/10/20  Yes Young, Tarri Fuller D, MD  vitamin B-12 (CYANOCOBALAMIN) 100 MCG tablet Take 100 mcg by mouth daily.   Yes [provider]  acetaminophen (TYLENOL) 650 MG CR tablet Mapap Arthritis Pain 650 mg tablet,extended release  TAKE 1 TABLET BY MOUTH EVERYDAY AT BEDTIME Patient not taking: No sig reported    [provider]  albuterol (VENTOLIN HFA) 108 (90 Base) MCG/ACT inhaler TAKE 2 PUFFS EVERY 6 HOURS AS NEEDED FORSHORTNESS OF BREATH/WHEEZING 09/10/20   Baird Lyons D, MD  CALCIUM-MAGNESIUM-ZINC PO Take 1 tablet by mouth daily.  Patient not taking: No sig reported    [provider]  cefdinir (OMNICEF) 300 MG capsule Take 300 mg by mouth 2 (two) times daily. Patient not taking: No sig reported 10/25/20   [provider]  metolazone (ZAROXOLYN) 2.5 MG tablet Take 1 tablet (2.5 mg total) by mouth daily as needed (ONE DAILY PRN WEIGHT GAIN). USE AS NEEDED FOR WEIGHT GAIN 09/06/19   Deberah Pelton, NP  mupirocin ointment (BACTROBAN) 2 % mupirocin 2 % topical ointment  APPLY TO WOUNDS ON SKIN ONCE DAILY AT BANDAGE CHANGES    [provider]   OXYGEN Inhale 4 L into the lungs daily. Patient states she wears 2L at night and 4L when up and moving    [provider]  temazepam (RESTORIL) 15 MG capsule TAKE 1 CAPSULE AT BEDTIME AS NEEDED  FOR  SLEEP 07/16/16   Young, Kasandra Knudsen, MD  tiZANidine (ZANAFLEX) 2 MG tablet Take 1 tablet (2 mg total) by mouth every 6 (six) hours as needed for muscle spasms. Patient not taking: No sig reported 11/30/19   Faustino Congress, NP    Allergies Patient has no known allergies.  Family History  Problem Relation Age of Onset  Heart disease Father    Heart attack Father    Diabetes type II Mother    Diabetes Mother    Hypertension Mother    Heart disease Brother        before age 60   Heart disease Sister        See's Dr. Acie Fredrickson   Breast cancer Neg Hx     Social History Social History   Tobacco Use   Smoking status: Former    Packs/day: 1.00    Years: 40.00    Pack years: 40.00    Types: Cigarettes    Quit date: 06/23/2001    Years since quitting: 19.8   Smokeless tobacco: Never  Vaping Use   Vaping Use: Never used  Substance Use Topics   Alcohol use: Yes    Alcohol/week: 0.0 standard drinks    Comment: once a week (glass of wine)   Drug use: No    Review of Systems Unable to assess ____________________________________________   PHYSICAL EXAM:  VITAL SIGNS: ED Triage Vitals  Enc Vitals Group     BP 05/04/21 1802 123/85     Pulse Rate 05/04/21 1802 (!) 127     Resp 05/04/21 1802 (!) 32     Temp 05/04/21 1802 98.6 F (37 C)     Temp Source 05/04/21 1802 Oral     SpO2 05/04/21 1759 (!) 85 %     Weight 05/04/21 1851 204 lb 9.4 oz (92.8 kg)     Height --      Head Circumference --      Peak Flow --      Pain Score 05/04/21 1802 0     Pain Loc --      Pain Edu? --      Excl. in [place]? --    Constitutional: Alert and disoriented. Well appearing and in no acute distress. Eyes: Conjunctivae are normal. PERRL. Head: Atraumatic. Nose: No  congestion/rhinnorhea. Mouth/Throat: Mucous membranes are moist. Neck: No stridor Cardiovascular: Grossly normal heart sounds.  Good peripheral circulation. Respiratory: Increased respiratory effort on 5 L nasal cannula.  No retractions. Gastrointestinal: Soft and nontender. No distention. Musculoskeletal: No obvious deformities Neurologic:  Normal speech and language. No gross focal neurologic deficits are appreciated. Skin:  Skin is warm and dry. No rash noted. Psychiatric: Mood and affect are normal. Speech and behavior are normal.  ____________________________________________   LABS (all labs ordered are listed, but only abnormal results are displayed)  Labs Reviewed  LACTIC ACID, PLASMA - Abnormal; Notable for the following components:      Result Value   Lactic Acid, Venous 2.0 (*)    All other components within normal limits  LACTIC ACID, PLASMA - Abnormal; Notable for the following components:   Lactic Acid, Venous 2.1 (*)    All other components within normal limits  COMPREHENSIVE METABOLIC PANEL - Abnormal; Notable for the following components:   Glucose, Bld 118 (*)    BUN 24 (*)    AST 48 (*)    Total Bilirubin 2.3 (*)    GFR, Estimated 58 (*)    All other components within normal limits  CBC WITH DIFFERENTIAL/PLATELET - Abnormal; Notable for the following components:   Hemoglobin 16.5 (*)    HCT 48.6 (*)    MCV 105.0 (*)    MCH 35.6 (*)    Neutro Abs 8.2 (*)    All other components within normal limits  PROTIME-INR - Abnormal; Notable for the following components:  Prothrombin Time 26.6 (*)    INR 2.5 (*)    All other components within normal limits  URINALYSIS, COMPLETE (UACMP) WITH MICROSCOPIC - Abnormal; Notable for the following components:   Color, Urine AMBER (*)    APPearance CLOUDY (*)    Hgb urine dipstick LARGE (*)    Ketones, ur 5 (*)    Protein, ur 100 (*)    Nitrite POSITIVE (*)    Leukocytes,Ua LARGE (*)    RBC / HPF >50 (*)    WBC, UA  >50 (*)    Bacteria, UA MANY (*)    Non Squamous Epithelial PRESENT (*)    All other components within normal limits  RESP PANEL BY RT-PCR (FLU A&B, COVID) ARPGX2  CULTURE, BLOOD (ROUTINE X 2)  CULTURE, BLOOD (ROUTINE X 2)  URINE CULTURE  APTT   ____________________________________________  EKG  ED ECG REPORT I, Naaman Plummer, the attending physician, personally viewed and interpreted this ECG.  Date: 05/04/2021 EKG Time: 1823 Rate: 130 Rhythm: Atrial fibrillation with rapid ventricular response QRS Axis: normal Intervals: normal ST/T Wave abnormalities: normal Narrative Interpretation: Atrial fibrillation with rapid ventricular response.  No evidence of acute ischemia  ____________________________________________  RADIOLOGY  ED MD interpretation: Single view portable chest x-ray shows pulmonary hypoinflation as well as mild left basilar atelectasis versus infiltrate  Official radiology report(s): DG Chest Port 1 View  Result Date: 05/04/2021 CLINICAL DATA:  Sepsis EXAM: PORTABLE CHEST 1 VIEW COMPARISON:  03/07/2021 FINDINGS: Lung volumes are small and pulmonary insufflation has diminished since prior examination. There is diffuse interstitial thickening which may relate to pulmonary hypoinflation. Superimposed mild left basilar atelectasis or infiltrate. No pneumothorax or pleural effusion. Cardiomegaly is stable when accounting for poor pulmonary insufflation. Superior mediastinal widening, again, likely relates to poor pulmonary insufflation. No acute bone abnormality. IMPRESSION: Pulmonary hypoinflation. Mild left basilar atelectasis or infiltrate. Stable cardiomegaly. Electronically Signed   By: Fidela Salisbury M.D.   On: 05/04/2021 19:06    ____________________________________________   PROCEDURES  Procedure(s) performed (including Critical Care):  .1-3 Lead EKG Interpretation Performed by: Naaman Plummer, MD Authorized by: Naaman Plummer, MD      Interpretation: abnormal     ECG rate:  97   ECG rate assessment: normal     Rhythm: atrial fibrillation     Ectopy: none     Conduction: normal    CRITICAL CARE Performed by: Naaman Plummer   Total critical care time: 35 minutes  Critical care time was exclusive of separately billable procedures and treating other patients.  Critical care was necessary to treat or prevent imminent or life-threatening deterioration.  Critical care was time spent personally by me on the following activities: development of treatment plan with patient and/or surrogate as well as nursing, discussions with consultants, evaluation of patient's response to treatment, examination of patient, obtaining history from patient or surrogate, ordering and performing treatments and interventions, ordering and review of laboratory studies, ordering and review of radiographic studies, pulse oximetry and re-evaluation of patient's condition.  ____________________________________________   INITIAL IMPRESSION / ASSESSMENT AND PLAN / ED COURSE  As part of my medical decision making, I reviewed the following data within the electronic medical record, if available:  Nursing notes reviewed and incorporated, Labs reviewed, EKG interpreted, Old chart reviewed, Radiograph reviewed and Notes from prior ED visits reviewed and incorporated      The Pt presents with altered mental status highly concerning for sepsis (suspected urinary source). At this time,  the Pt is satting well on 5 L nasal cannula, normotensive, and appears HDS.  Will start empiric antibiotics and fluids.  Due to lower blood pressures, will administer fluids gradually with frequent reassessment. Have low suspicion for a GI, skin/soft tissue, or CNS source at this time, but will reconsider if initial workup is unremarkable.  - CBC, BMP, LFTs - VBG - UA - BCx x2, Lactate - EKG - CXR - Empiric Abx: Rocephin/azithromycin - Fluids: 1 L LR, 150 cc/h LR  Dispo:  Admit to medicine      ____________________________________________   FINAL CLINICAL IMPRESSION(S) / ED DIAGNOSES  Final diagnoses:  Altered mental status, unspecified altered mental status type  Lower urinary tract infectious disease  Confusion  Acute respiratory failure with hypoxia [facility])     ED Discharge Orders     None        Note:  This document was prepared using Dragon voice recognition software and may include unintentional dictation errors.    Naaman Plummer, MD 05/04/21 2218

## 2021-05-05 ENCOUNTER — Inpatient Hospital Stay: Payer: Medicare Other

## 2021-05-05 ENCOUNTER — Other Ambulatory Visit: Payer: Self-pay

## 2021-05-05 ENCOUNTER — Encounter: Payer: Self-pay | Admitting: Internal Medicine

## 2021-05-05 DIAGNOSIS — J9621 Acute and chronic respiratory failure with hypoxia: Secondary | ICD-10-CM | POA: Diagnosis not present

## 2021-05-05 DIAGNOSIS — G9341 Metabolic encephalopathy: Secondary | ICD-10-CM

## 2021-05-05 DIAGNOSIS — N3001 Acute cystitis with hematuria: Secondary | ICD-10-CM

## 2021-05-05 DIAGNOSIS — I4891 Unspecified atrial fibrillation: Secondary | ICD-10-CM

## 2021-05-05 DIAGNOSIS — A419 Sepsis, unspecified organism: Secondary | ICD-10-CM | POA: Diagnosis not present

## 2021-05-05 DIAGNOSIS — C3490 Malignant neoplasm of unspecified part of unspecified bronchus or lung: Secondary | ICD-10-CM

## 2021-05-05 DIAGNOSIS — I5042 Chronic combined systolic (congestive) and diastolic (congestive) heart failure: Secondary | ICD-10-CM

## 2021-05-05 LAB — COMPREHENSIVE METABOLIC PANEL
ALT: 16 U/L (ref 0–44)
AST: 39 U/L (ref 15–41)
Albumin: 3 g/dL — ABNORMAL LOW (ref 3.5–5.0)
Alkaline Phosphatase: 37 U/L — ABNORMAL LOW (ref 38–126)
Anion gap: 8 (ref 5–15)
BUN: 20 mg/dL (ref 8–23)
CO2: 28 mmol/L (ref 22–32)
Calcium: 8.9 mg/dL (ref 8.9–10.3)
Chloride: 101 mmol/L (ref 98–111)
Creatinine, Ser: 0.76 mg/dL (ref 0.44–1.00)
GFR, Estimated: >60 mL/min (ref >60)
Glucose, Bld: 142 mg/dL — ABNORMAL HIGH (ref 70–99)
Potassium: 3.8 mmol/L (ref 3.5–5.1)
Sodium: 137 mmol/L (ref 135–145)
Total Bilirubin: 1.4 mg/dL — ABNORMAL HIGH (ref 0.3–1.2)
Total Protein: 6.7 g/dL (ref 6.5–8.1)

## 2021-05-05 LAB — BLOOD GAS, ARTERIAL
Acid-Base Excess: 3.1 mmol/L — ABNORMAL HIGH (ref 0.0–2.0)
Bicarbonate: 27.9 mmol/L (ref 20.0–28.0)
FIO2: 0.4
O2 Saturation: 96.8 %
Patient temperature: 37
pCO2 arterial: 42 mmHg (ref 32.0–48.0)
pH, Arterial: 7.43 (ref 7.350–7.450)
pO2, Arterial: 86 mmHg (ref 83.0–108.0)

## 2021-05-05 LAB — CBC
HCT: 45.1 % (ref 36.0–46.0)
Hemoglobin: 14.9 g/dL (ref 12.0–15.0)
MCH: 34.8 pg — ABNORMAL HIGH (ref 26.0–34.0)
MCHC: 33 g/dL (ref 30.0–36.0)
MCV: 105.4 fL — ABNORMAL HIGH (ref 80.0–100.0)
Platelets: 141 10*3/uL — ABNORMAL LOW (ref 150–400)
RBC: 4.28 MIL/uL (ref 3.87–5.11)
RDW: 13.4 % (ref 11.5–15.5)
WBC: 7.8 10*3/uL (ref 4.0–10.5)
nRBC: 0 % (ref 0.0–0.2)

## 2021-05-05 LAB — LACTIC ACID, PLASMA
Lactic Acid, Venous: 6.3 mmol/L (ref 0.5–1.9)
Lactic Acid, Venous: 1.8 mmol/L (ref 0.5–1.9)

## 2021-05-05 LAB — PROTIME-INR
INR: 2.3 — ABNORMAL HIGH (ref 0.8–1.2)
Prothrombin Time: 25.6 seconds — ABNORMAL HIGH (ref 11.4–15.2)

## 2021-05-05 LAB — PROCALCITONIN: Procalcitonin: <0.1 ng/mL

## 2021-05-05 LAB — GLUCOSE, CAPILLARY: Glucose-Capillary: 129 mg/dL — ABNORMAL HIGH (ref 70–99)

## 2021-05-05 LAB — CORTISOL-AM, BLOOD: Cortisol - AM: 15.5 ug/dL (ref 6.7–22.6)

## 2021-05-05 LAB — MRSA NEXT GEN BY PCR, NASAL: MRSA by PCR Next Gen: NOT DETECTED

## 2021-05-05 LAB — BRAIN NATRIURETIC PEPTIDE: B Natriuretic Peptide: 249.5 pg/mL — ABNORMAL HIGH (ref 0.0–100.0)

## 2021-05-05 MED ORDER — METHOCARBAMOL 500 MG PO TABS
500.0000 mg | ORAL_TABLET | Freq: Once | ORAL | Status: AC
  Administered 2021-05-05: 500 mg via ORAL
  Filled 2021-05-05: qty 1

## 2021-05-05 MED ORDER — ACETAMINOPHEN 500 MG PO TABS
1000.0000 mg | ORAL_TABLET | Freq: Four times a day (QID) | ORAL | Status: DC | PRN
  Administered 2021-05-05 – 2021-05-09 (×3): 1000 mg via ORAL
  Filled 2021-05-05 (×3): qty 2

## 2021-05-05 MED ORDER — METOPROLOL TARTRATE 5 MG/5ML IV SOLN
5.0000 mg | Freq: Once | INTRAVENOUS | Status: DC
  Filled 2021-05-05: qty 5

## 2021-05-05 MED ORDER — METOPROLOL TARTRATE 25 MG PO TABS
25.0000 mg | ORAL_TABLET | Freq: Two times a day (BID) | ORAL | Status: DC
  Administered 2021-05-05 – 2021-05-09 (×9): 25 mg via ORAL
  Filled 2021-05-05 (×9): qty 1

## 2021-05-05 MED ORDER — METOPROLOL TARTRATE 5 MG/5ML IV SOLN
5.0000 mg | Freq: Once | INTRAVENOUS | Status: AC
Start: 2021-05-05 — End: 2021-05-05
  Administered 2021-05-05: 5 mg via INTRAVENOUS
  Filled 2021-05-05: qty 5

## 2021-05-05 MED ORDER — CHLORHEXIDINE GLUCONATE CLOTH 2 % EX PADS
6.0000 | MEDICATED_PAD | Freq: Every day | CUTANEOUS | Status: DC
  Administered 2021-05-05 – 2021-05-09 (×5): 6 via TOPICAL

## 2021-05-05 NOTE — ED Notes (Signed)
Took report from [employer]. Pt has HR of 140s uncontrolled AFIb that hasnt been treated and lactic acid of 6.1. RN advised she had contacted MD and they was to refer back to hospitalist,. This RN message MD ASAP about critical lab and uncontrolled AFIB.

## 2021-05-05 NOTE — Consult Note (Signed)
Consulted by Dr. Damita Dunnings due to concerns regarding AMS, acute hypoxia, A-Fib RVR and worsening lactic acidosis. Upon bedside assessment patient alert and responsive, able to follow commands, no current complaints at this time. Vitals stable, HR improved status post 5 mg lopressor IVP, patient remains hemodynamically stable, Oxygen titrated back to chronic level at 3 L Cut [place].  Discussed with Dr. Damita Dunnings, will hold off on official consult at this time, please re-consult if patient's condition deteriorates and she becomes hemodynamically unstable or requires increasing oxygen support.    Domingo Pulse [place], AGACNP-BC Acute Care Nurse Practitioner [facility]   [phone] / [phone] Please see Amion for pager details.

## 2021-05-05 NOTE — Progress Notes (Addendum)
Progress Note    [name]  EQA:[id number] DOB: Feb 22, 1941  DOA: 05/04/2021 PCP: Burnard Bunting, MD      Brief Narrative:    Medical records reviewed and are as summarized below:  [name] is a 80 y.o. female with medical history significant for chronic combined systolic and diastolic CHF (EF 35 to 97% in September 2022) COPD, atrial fibrillation on Eliquis, chronic hypoxemic respiratory failure on 3 to 4 L/min oxygen, anxiety, hypertension, hypothyroidism, small cell lung cancer, OSA, PVD.  She presented to the hospital for assisted-living facility because of altered mental status and generalized weakness.  When EMS picked her up, she was hypoxemic and was in rapid atrial fibrillation.  She was admitted to the hospital for severe sepsis secondary to UTI complicated by acute metabolic encephalopathy and acute hypoxemic respiratory failure.    Assessment/Plan:   Principal Problem:   Severe sepsis (HCC) Active Problems:   COPD (chronic obstructive pulmonary disease) (HCC)   Atrial fibrillation with RVR (HCC)   Chronic combined systolic and diastolic CHF (congestive heart failure) (HCC)   Small cell carcinoma of lung (HCC)   Acute on chronic respiratory failure with hypoxia (HCC)   Acute metabolic encephalopathy   UTI (urinary tract infection)   Anticoagulant long-term use   Hyperbilirubinemia    Body mass index is 33.2 kg/m.  (Obesity)  Severe sepsis secondary to acute UTI: Continue empiric IV antibiotics.  Continue IV fluids we will discontinue fluids later today to avoid fluid overload.  Lactic acid has normalized.  Follow-up urine and blood cultures.  Repeat chest x-ray showed atelectasis but no evidence of infection.  Atrial fibrillation with RVR: IV metoprolol as needed for tachycardia.  Continue home metoprolol for A. fib.  Continue Eliquis.  Acute on chronic hypoxemic respiratory failure: Continue 3L/ min oxygen via nasal cannula  COPD,  chronic systolic and diastolic CHF: Compensated.  Continue bronchodilators  History of small cell lung cancer  Diet Order             Diet Heart Room service appropriate? Yes; Fluid consistency: Thin  Diet effective now                      Consultants: None  Procedures: None    Medications:    apixaban  5 mg Oral BID   Chlorhexidine Gluconate Cloth  6 each Topical Daily   citalopram  20 mg Oral Daily   digoxin  125 mcg Oral Daily   furosemide  40 mg Oral BID   guaiFENesin  1,200 mg Oral BID   levothyroxine  88 mcg Oral Daily   megestrol  40 mg Oral BID   metoprolol tartrate  5 mg Intravenous Once   metoprolol tartrate  25 mg Oral BID   potassium chloride  10 mEq Oral BID   tiotropium  18 mcg Inhalation Daily   vitamin B-12  100 mcg Oral Daily   Continuous Infusions:  azithromycin Stopped (05/05/21 0056)   cefTRIAXone (ROCEPHIN)  IV Stopped (05/05/21 0229)   lactated ringers 150 mL/hr at 05/04/21 2003     Anti-infectives (From admission, onward)    Start     Dose/Rate Route Frequency Ordered Stop   05/05/21 0200  cefTRIAXone (ROCEPHIN) 2 g in sodium chloride 0.9 % 100 mL IVPB        2 g 200 mL/hr over 30 Minutes Intravenous Every 24 hours 05/04/21 2218 05/10/21 0159   05/04/21 2230  azithromycin (ZITHROMAX)  500 mg in sodium chloride 0.9 % 250 mL IVPB        500 mg 250 mL/hr over 60 Minutes Intravenous Every 24 hours 05/04/21 2218 05/09/21 2229   05/04/21 1830  vancomycin (VANCOCIN) IVPB 1000 mg/200 mL premix        1,000 mg 200 mL/hr over 60 Minutes Intravenous  Once 05/04/21 1822 05/04/21 2003   05/04/21 1830  ceFEPIme (MAXIPIME) 2 g in sodium chloride 0.9 % 100 mL IVPB        2 g 200 mL/hr over 30 Minutes Intravenous  Once 05/04/21 1822 05/04/21 1908              Family Communication/Anticipated D/C date and plan/Code Status   DVT prophylaxis:  apixaban (ELIQUIS) tablet 5 mg     Code Status: Full Code  Family Communication:  None Disposition Plan: 3 to 4 days   Status is: Inpatient  Remains inpatient appropriate because: On IV antibiotics           Subjective:   Interval events noted.  No abdominal pain, chest pain or shortness of breath  Objective:    Vitals:   05/05/21 0730 05/05/21 0745 05/05/21 0833 05/05/21 0900  BP: 107/80 110/79 119/77 102/75  Pulse: (!) 145 (!) 110 (!) 120 (!) 33  Resp: (!) 26 (!) 28 17 (!) 29  Temp:  98.7 F (37.1 C) 98 F (36.7 C)   TempSrc:  Oral Oral   SpO2: 92% 93% 94% 92%  Weight:   93.3 kg   Height:   5\' 6"  (1.676 m)    No data found.   Intake/Output Summary (Last 24 hours) at 05/05/2021 1033 Last data filed at 05/05/2021 0229 Gross per 24 hour  Intake 350 ml  Output --  Net 350 ml   Filed Weights   05/04/21 1851 05/05/21 0833  Weight: 92.8 kg 93.3 kg    Exam:  GEN: NAD SKIN: Warm and dry EYES: No pallor or icterus ENT: MMM CV: Regular rate and rhythm, tachycardic PULM: CTA B ABD: soft, obese, NT, +BS CNS: AAO x 3, non focal EXT: No edema or tenderness        Data Reviewed:   I have personally reviewed following labs and imaging studies:  Labs: Labs show the following:   Basic Metabolic Panel: Recent Labs  Lab 05/04/21 1816 05/05/21 0647  NA 135 137  K 4.2 3.8  CL 99 101  CO2 27 28  GLUCOSE 118* 142*  BUN 24* 20  CREATININE 0.97 0.76  CALCIUM 9.4 8.9   GFR Estimated Creatinine Clearance: 62.4 mL/min (by C-G formula based on SCr of 0.76 mg/dL). Liver Function Tests: Recent Labs  Lab 05/04/21 1816 05/05/21 0647  AST 48* 39  ALT 19 16  ALKPHOS 44 37*  BILITOT 2.3* 1.4*  PROT 8.1 6.7  ALBUMIN 3.9 3.0*   No results for input(s): LIPASE, AMYLASE in the last 168 hours. No results for input(s): AMMONIA in the last 168 hours. Coagulation profile Recent Labs  Lab 05/04/21 1816 05/05/21 0647  INR 2.5* 2.3*    CBC: Recent Labs  Lab 05/04/21 1816 05/05/21 0647  WBC 10.5 7.8  NEUTROABS 8.2*  --   HGB  16.5* 14.9  HCT 48.6* 45.1  MCV 105.0* 105.4*  PLT 177 141*   Cardiac Enzymes: No results for input(s): CKTOTAL, CKMB, CKMBINDEX, TROPONINI in the last 168 hours. BNP (last 3 results) No results for input(s): PROBNP in the last 8760 hours. CBG: Recent Labs  Lab 05/05/21 0828  GLUCAP 129*   D-Dimer: No results for input(s): DDIMER in the last 72 hours. Hgb A1c: No results for input(s): HGBA1C in the last 72 hours. Lipid Profile: No results for input(s): CHOL, HDL, LDLCALC, TRIG, CHOLHDL, LDLDIRECT in the last 72 hours. Thyroid function studies: No results for input(s): TSH, T4TOTAL, T3FREE, THYROIDAB in the last 72 hours.  Invalid input(s): FREET3 Anemia work up: No results for input(s): VITAMINB12, FOLATE, FERRITIN, TIBC, IRON, RETICCTPCT in the last 72 hours. Sepsis Labs: Recent Labs  Lab 05/04/21 1816 05/04/21 1821 05/04/21 2000 05/05/21 0246 05/05/21 0647  PROCALCITON  --   --   --   --  <0.10  WBC 10.5  --   --   --  7.8  LATICACIDVEN  --  2.0* 2.1* 6.3* 1.8    Microbiology Recent Results (from the past 240 hour(s))  Resp Panel by RT-PCR (Flu A&B, Covid) Nasopharyngeal Swab     Status: None   Collection Time: 05/04/21  6:21 PM   Specimen: Nasopharyngeal Swab; Nasopharyngeal(NP) swabs in vial transport medium  Result Value Ref Range Status   SARS Coronavirus 2 by RT PCR NEGATIVE NEGATIVE Final    Comment: (NOTE) SARS-CoV-2 target nucleic acids are NOT DETECTED.  The SARS-CoV-2 RNA is generally detectable in upper respiratory specimens during the acute phase of infection. The lowest concentration of SARS-CoV-2 viral copies this assay can detect is 138 copies/mL. A negative result does not preclude SARS-Cov-2 infection and should not be used as the sole basis for treatment or other patient management decisions. A negative result may occur with  improper specimen collection/handling, submission of specimen other than nasopharyngeal swab, presence of viral  mutation(s) within the areas targeted by this assay, and inadequate number of viral copies(<138 copies/mL). A negative result must be combined with clinical observations, patient history, and epidemiological information. The expected result is Negative.  Fact Sheet for Patients:  EntrepreneurPulse.com.au  Fact Sheet for Healthcare Providers:  IncredibleEmployment.be  This test is no t yet approved or cleared by the [place] FDA and  has been authorized for detection and/or diagnosis of SARS-CoV-2 by FDA under an Emergency Use Authorization (EUA). This EUA will remain  in effect (meaning this test can be used) for the duration of the COVID-19 declaration under Section 564(b)(1) of the Act, 21 U.S.C.section 360bbb-3(b)(1), unless the authorization is terminated  or revoked sooner.       Influenza A by PCR NEGATIVE NEGATIVE Final   Influenza B by PCR NEGATIVE NEGATIVE Final    Comment: (NOTE) The Xpert Xpress SARS-CoV-2/FLU/RSV plus assay is intended as an aid in the diagnosis of influenza from Nasopharyngeal swab specimens and should not be used as a sole basis for treatment. Nasal washings and aspirates are unacceptable for Xpert Xpress SARS-CoV-2/FLU/RSV testing.  Fact Sheet for Patients: EntrepreneurPulse.com.au  Fact Sheet for Healthcare Providers: IncredibleEmployment.be  This test is not yet approved or cleared by the [place] FDA and has been authorized for detection and/or diagnosis of SARS-CoV-2 by FDA under an Emergency Use Authorization (EUA). This EUA will remain in effect (meaning this test can be used) for the duration of the COVID-19 declaration under Section 564(b)(1) of the Act, 21 U.S.C. section 360bbb-3(b)(1), unless the authorization is terminated or revoked.  Performed at [facility], [address] [place], [place] [address]   Blood Culture (routine x 2)      Status: None (Preliminary result)   Collection Time: 05/04/21  6:21 PM  Specimen: BLOOD  Result Value Ref Range Status   Specimen Description BLOOD RIGHT [facility]  Final   Special Requests   Final    BOTTLES DRAWN AEROBIC AND ANAEROBIC Blood Culture adequate volume   Culture   Final    NO GROWTH < 12 HOURS Performed at [facility], [address], [place], [place] [address]    Report Status PENDING  Incomplete  Blood Culture (routine x 2)     Status: None (Preliminary result)   Collection Time: 05/04/21  6:26 PM   Specimen: BLOOD  Result Value Ref Range Status   Specimen Description BLOOD LEFT HAND  Final   Special Requests   Final    BOTTLES DRAWN AEROBIC AND ANAEROBIC Blood Culture adequate volume   Culture   Final    NO GROWTH < 12 HOURS Performed at [facility], [address], [place], [place] [address]    Report Status PENDING  Incomplete  MRSA Next Gen by PCR, Nasal     Status: None   Collection Time: 05/05/21  8:50 AM   Specimen: Nasal Mucosa; Nasal Swab  Result Value Ref Range Status   MRSA by PCR Next Gen NOT DETECTED NOT DETECTED Final    Comment: (NOTE) The GeneXpert MRSA Assay (FDA approved for NASAL specimens only), is one component of a comprehensive MRSA colonization surveillance program. It is not intended to diagnose MRSA infection nor to guide or monitor treatment for MRSA infections. Test performance is not FDA approved in patients less than 8 years old. Performed at [facility], [address] [place], [place] [address]     Procedures and diagnostic studies:  DG Chest [facility] 1 View  Result Date: 05/05/2021 CLINICAL DATA:  Dyspnea EXAM: PORTABLE CHEST 1 VIEW COMPARISON:  05/04/2021 FINDINGS: Since the prior examination, there has been little change. Lung volumes are small, however, pulmonary insufflation is stable. Interstitial thickening persists, likely related to underlying interstitial disease compounded by pulmonary  hypoinflation. Mild left basilar atelectasis is present. No pneumothorax or pleural effusion. Cardiac size is mildly enlarged, unchanged. Pulmonary vascularity is normal. No acute bone abnormality. IMPRESSION: Stable pulmonary hypoinflation.  Left basilar atelectasis. Stable cardiomegaly. Electronically Signed   By: Fidela Salisbury M.D.   On: 05/05/2021 02:22   DG Chest Port 1 View  Result Date: 05/04/2021 CLINICAL DATA:  Sepsis EXAM: PORTABLE CHEST 1 VIEW COMPARISON:  03/07/2021 FINDINGS: Lung volumes are small and pulmonary insufflation has diminished since prior examination. There is diffuse interstitial thickening which may relate to pulmonary hypoinflation. Superimposed mild left basilar atelectasis or infiltrate. No pneumothorax or pleural effusion. Cardiomegaly is stable when accounting for poor pulmonary insufflation. Superior mediastinal widening, again, likely relates to poor pulmonary insufflation. No acute bone abnormality. IMPRESSION: Pulmonary hypoinflation. Mild left basilar atelectasis or infiltrate. Stable cardiomegaly. Electronically Signed   By: Fidela Salisbury M.D.   On: 05/04/2021 19:06               LOS: 1 day   [facility]   Pager on [email or web address]. If 7PM-7AM, please contact night-coverage at [email or web address]     05/05/2021, 10:33 AM

## 2021-05-06 DIAGNOSIS — I4891 Unspecified atrial fibrillation: Secondary | ICD-10-CM | POA: Diagnosis not present

## 2021-05-06 DIAGNOSIS — J9621 Acute and chronic respiratory failure with hypoxia: Secondary | ICD-10-CM | POA: Diagnosis not present

## 2021-05-06 DIAGNOSIS — G9341 Metabolic encephalopathy: Secondary | ICD-10-CM | POA: Diagnosis not present

## 2021-05-06 DIAGNOSIS — A419 Sepsis, unspecified organism: Secondary | ICD-10-CM | POA: Diagnosis not present

## 2021-05-06 LAB — MAGNESIUM: Magnesium: 2.1 mg/dL (ref 1.7–2.4)

## 2021-05-06 MED ORDER — ADULT MULTIVITAMIN W/MINERALS CH
1.0000 | ORAL_TABLET | Freq: Every day | ORAL | Status: DC
  Administered 2021-05-07 – 2021-05-09 (×3): 1 via ORAL
  Filled 2021-05-06 (×3): qty 1

## 2021-05-06 MED ORDER — ENSURE ENLIVE PO LIQD
237.0000 mL | Freq: Two times a day (BID) | ORAL | Status: DC
Start: 2021-05-06 — End: 2021-05-09
  Administered 2021-05-07 – 2021-05-09 (×4): 237 mL via ORAL

## 2021-05-06 MED ORDER — AZITHROMYCIN 500 MG PO TABS
500.0000 mg | ORAL_TABLET | Freq: Every day | ORAL | Status: DC
  Filled 2021-05-06: qty 1

## 2021-05-06 NOTE — Progress Notes (Signed)
Initial Nutrition Assessment  DOCUMENTATION CODES:   Obesity unspecified  INTERVENTION:   Ensure Enlive po BID, each supplement provides 350 kcal and 20 grams of protein  MVI po daily   Liberalize diet  Pt at high refeed risk; recommend monitor potassium, magnesium and phosphorus labs daily until stable  NUTRITION DIAGNOSIS:   Inadequate oral intake related to acute illness as evidenced by per patient/family report.  GOAL:   Patient will meet greater than or equal to 90% of their needs  MONITOR:   PO intake, Supplement acceptance, Labs, Weight trends, Skin, I & O's  REASON FOR ASSESSMENT:   Malnutrition Screening Tool    ASSESSMENT:   80 y/o female with h/o Afib, AAA, anxiety, depression, COPD, DVT, NSCLC, hiatal hernia, OSA and ovarian mass who is admitted with UTI and sepsis.  Met with pt in room today. Pt reports good appetite and oral intake at baseline but reports that her oral intake has been poor for several days pta r/t nausea. Pt reports that she did not eat much of her breakfast this morning. Pt documented to have eaten 80% of her dinner last night. Pt reports that she drinks chocolate Boost at home. RD will add supplements and MVI to help pt meet her estimated needs. Pt is likely at refeed risk. Per chart, pt is down 14lbs(6%) since March; this is not significant weight loss.    Medications reviewed and include: celexa, lasix, synthroid, megace, KCl, B12, ceftriaxone  Labs reviewed: K 3.8 wnl  NUTRITION - FOCUSED PHYSICAL EXAM:  Flowsheet Row Most Recent Value  Orbital Region No depletion  Upper Arm Region No depletion  Thoracic and Lumbar Region No depletion  Buccal Region No depletion  Temple Region No depletion  Clavicle Bone Region No depletion  Clavicle and Acromion Bone Region No depletion  Scapular Bone Region No depletion  Dorsal Hand No depletion  Patellar Region No depletion  Anterior Thigh Region No depletion  Posterior Calf Region No  depletion  Edema (RD Assessment) Moderate  Hair Reviewed  Eyes Reviewed  Mouth Reviewed  Skin Reviewed  Nails Reviewed   Diet Order:   Diet Order             Diet Heart Room service appropriate? Yes; Fluid consistency: Thin  Diet effective now                  EDUCATION NEEDS:   Education needs have been addressed  Skin:  Skin Assessment: Reviewed RN Assessment  Last BM:  11/13- type 6  Height:   Ht Readings from Last 1 Encounters:  05/05/21 5' 6"  (1.676 m)    Weight:   Wt Readings from Last 1 Encounters:  05/05/21 93.3 kg    Ideal Body Weight:  59 kg  BMI:  Body mass index is 33.2 kg/m.  Estimated Nutritional Needs:   Kcal:  1800-2100kcal/day  Protein:  90-105g/day  Fluid:  1.5-1.8L/day  Koleen Distance MS, RD, LDN Please refer to [facility] for RD and/or RD on-call/weekend/after hours pager

## 2021-05-06 NOTE — Progress Notes (Signed)
Intermittent confusion. Pulled off oxygen and trying to get up out of the bed. Unaware of place/where and why she is here. Reoriented to surroundings and situation.  She is aware and acknowledges that she is having intermittent confusion upon waking and unsure where she is.  She is alert to person and place and year later on.  O2 @ 4L [place] in place now. VS stable. Bed mats placed on each side of bed and bed alarm is engaged. Education provided on call system and how to call for assistance when needed. Pure wick placed and educated on the use of the pure wick. Verbalized understanding of safety and all education provided.  Continuing to monitor.

## 2021-05-06 NOTE — Progress Notes (Signed)
Pt remains on 4L [place].   Pt voided over 1L, some unaccounted for on bed.   Pt increasingly confused and agitated this AM, neuro status improved over course of day. Pt still remains confused to situation.

## 2021-05-06 NOTE — Progress Notes (Addendum)
Progress Note    Athena Baltz  DGU:[id number] DOB: 1940-08-21  DOA: 05/04/2021 PCP: Burnard Bunting, MD      Brief Narrative:    Medical records reviewed and are as summarized below:  Kathern Lobosco is a 80 y.o. female with medical history significant for chronic combined systolic and diastolic CHF (EF 35 to 95% in September 2022) COPD, atrial fibrillation on Eliquis, chronic hypoxemic respiratory failure on 3 to 4 L/min oxygen, anxiety, hypertension, hypothyroidism, small cell lung cancer, OSA, PVD.  She presented to the hospital for assisted-living facility because of altered mental status and generalized weakness.  When EMS picked her up, she was hypoxemic and was in rapid atrial fibrillation.  She was admitted to the hospital for severe sepsis secondary to UTI complicated by acute metabolic encephalopathy and acute hypoxemic respiratory failure.    Assessment/Plan:   Principal Problem:   Severe sepsis (HCC) Active Problems:   COPD (chronic obstructive pulmonary disease) (HCC)   Atrial fibrillation with RVR (HCC)   Chronic combined systolic and diastolic CHF (congestive heart failure) (HCC)   Small cell carcinoma of lung (HCC)   Acute on chronic respiratory failure with hypoxia (HCC)   Acute metabolic encephalopathy   UTI (urinary tract infection)   Anticoagulant long-term use   Hyperbilirubinemia    Body mass index is 33.2 kg/m.  (Obesity)  Severe sepsis secondary to acute UTI: Urine culture showed E. coli.  Susceptibility report is pending.  Continue empiric IV Rocephin.  Discontinue azithromycin.  Follow-up urine and blood cultures.    Atrial fibrillation with RVR: Continue metoprolol and Eliquis.  Acute metabolic encephalopathy: Continue supportive care  Acute on chronic hypoxemic respiratory failure: She is on 4 L/min oxygen via nasal cannula which is about her baseline.  COPD, chronic systolic and diastolic CHF: Compensated.  Continue  bronchodilators  History of small cell lung cancer  Diet Order             Diet Heart Room service appropriate? Yes; Fluid consistency: Thin  Diet effective now                      Consultants: None  Procedures: None    Medications:    apixaban  5 mg Oral BID   Chlorhexidine Gluconate Cloth  6 each Topical Daily   citalopram  20 mg Oral Daily   digoxin  125 mcg Oral Daily   furosemide  40 mg Oral BID   guaiFENesin  1,200 mg Oral BID   levothyroxine  88 mcg Oral Daily   megestrol  40 mg Oral BID   metoprolol tartrate  5 mg Intravenous Once   metoprolol tartrate  25 mg Oral BID   potassium chloride  10 mEq Oral BID   tiotropium  18 mcg Inhalation Daily   vitamin B-12  100 mcg Oral Daily   Continuous Infusions:  azithromycin Stopped (05/05/21 2139)   cefTRIAXone (ROCEPHIN)  IV Stopped (05/06/21 0322)     Anti-infectives (From admission, onward)    Start     Dose/Rate Route Frequency Ordered Stop   05/05/21 0200  cefTRIAXone (ROCEPHIN) 2 g in sodium chloride 0.9 % 100 mL IVPB        2 g 200 mL/hr over 30 Minutes Intravenous Every 24 hours 05/04/21 2218 05/10/21 0159   05/04/21 2230  azithromycin (ZITHROMAX) 500 mg in sodium chloride 0.9 % 250 mL IVPB        500 mg 250 mL/hr  over 60 Minutes Intravenous Every 24 hours 05/04/21 2218 05/09/21 2229   05/04/21 1830  vancomycin (VANCOCIN) IVPB 1000 mg/200 mL premix        1,000 mg 200 mL/hr over 60 Minutes Intravenous  Once 05/04/21 1822 05/04/21 2003   05/04/21 1830  ceFEPIme (MAXIPIME) 2 g in sodium chloride 0.9 % 100 mL IVPB        2 g 200 mL/hr over 30 Minutes Intravenous  Once 05/04/21 1822 05/04/21 1908              Family Communication/Anticipated D/C date and plan/Code Status   DVT prophylaxis:  apixaban (ELIQUIS) tablet 5 mg     Code Status: Full Code  Family Communication: Anne Ng, daughter, over the phone Disposition Plan: 2 to 3 days    Status is: Inpatient  Remains inpatient  appropriate because: On IV antibiotics           Subjective:   Interval events noted.  Patient is more confused today.  Spoke to [place], Therapist, sports, who acknowledged that patient has been confused.  Objective:    Vitals:   05/06/21 0500 05/06/21 0600 05/06/21 0700 05/06/21 0800  BP: 119/90 132/78 131/84 (!) 133/98  Pulse: 92 (!) 101 75 82  Resp: (!) 27 (!) 31 16 (!) 28  Temp:      TempSrc:      SpO2: (!) 86% 93% 96% 97%  Weight:      Height:       No data found.   Intake/Output Summary (Last 24 hours) at 05/06/2021 0851 Last data filed at 05/06/2021 0456 Gross per 24 hour  Intake 2330.07 ml  Output 900 ml  Net 1430.07 ml   Filed Weights   05/04/21 1851 05/05/21 0833  Weight: 92.8 kg 93.3 kg    Exam:  GEN: NAD SKIN: Warm and dry EYES: No pallor or icterus ENT: MMM CV: Irregular rate and rhythm, mildly tachycardic PULM: CTA B ABD: soft, obese, NT, +BS CNS: AAO x 1 (person), non focal EXT: No edema or tenderness        Data Reviewed:   I have personally reviewed following labs and imaging studies:  Labs: Labs show the following:   Basic Metabolic Panel: Recent Labs  Lab 05/04/21 1816 05/05/21 0647  NA 135 137  K 4.2 3.8  CL 99 101  CO2 27 28  GLUCOSE 118* 142*  BUN 24* 20  CREATININE 0.97 0.76  CALCIUM 9.4 8.9   GFR Estimated Creatinine Clearance: 62.4 mL/min (by C-G formula based on SCr of 0.76 mg/dL). Liver Function Tests: Recent Labs  Lab 05/04/21 1816 05/05/21 0647  AST 48* 39  ALT 19 16  ALKPHOS 44 37*  BILITOT 2.3* 1.4*  PROT 8.1 6.7  ALBUMIN 3.9 3.0*   No results for input(s): LIPASE, AMYLASE in the last 168 hours. No results for input(s): AMMONIA in the last 168 hours. Coagulation profile Recent Labs  Lab 05/04/21 1816 05/05/21 0647  INR 2.5* 2.3*    CBC: Recent Labs  Lab 05/04/21 1816 05/05/21 0647  WBC 10.5 7.8  NEUTROABS 8.2*  --   HGB 16.5* 14.9  HCT 48.6* 45.1  MCV 105.0* 105.4*  PLT 177 141*    Cardiac Enzymes: No results for input(s): CKTOTAL, CKMB, CKMBINDEX, TROPONINI in the last 168 hours. BNP (last 3 results) No results for input(s): PROBNP in the last 8760 hours. CBG: Recent Labs  Lab 05/05/21 0828  GLUCAP 129*   D-Dimer: No results for input(s): DDIMER in  the last 72 hours. Hgb A1c: No results for input(s): HGBA1C in the last 72 hours. Lipid Profile: No results for input(s): CHOL, HDL, LDLCALC, TRIG, CHOLHDL, LDLDIRECT in the last 72 hours. Thyroid function studies: No results for input(s): TSH, T4TOTAL, T3FREE, THYROIDAB in the last 72 hours.  Invalid input(s): FREET3 Anemia work up: No results for input(s): VITAMINB12, FOLATE, FERRITIN, TIBC, IRON, RETICCTPCT in the last 72 hours. Sepsis Labs: Recent Labs  Lab 05/04/21 1816 05/04/21 1821 05/04/21 2000 05/05/21 0246 05/05/21 0647  PROCALCITON  --   --   --   --  <0.10  WBC 10.5  --   --   --  7.8  LATICACIDVEN  --  2.0* 2.1* 6.3* 1.8    Microbiology Recent Results (from the past 240 hour(s))  Urine Culture     Status: Abnormal (Preliminary result)   Collection Time: 05/04/21  6:15 PM   Specimen: In/Out Cath Urine  Result Value Ref Range Status   Specimen Description   Final    IN/OUT CATH URINE Performed at [facility], [address] [place], [place] [address]    Special Requests   Final    NONE Performed at [facility], [address] [place], [place] [address]    Culture (A)  Final    20,000 COLONIES/mL ESCHERICHIA COLI SUSCEPTIBILITIES TO FOLLOW Performed at [facility], [place] [address], [place],  [address]    Report Status PENDING  Incomplete  Resp Panel by RT-PCR (Flu A&B, Covid) Nasopharyngeal Swab     Status: None   Collection Time: 05/04/21  6:21 PM   Specimen: Nasopharyngeal Swab; Nasopharyngeal(NP) swabs in vial transport medium  Result Value Ref Range Status   SARS Coronavirus 2 by RT PCR NEGATIVE NEGATIVE Final    Comment:  (NOTE) SARS-CoV-2 target nucleic acids are NOT DETECTED.  The SARS-CoV-2 RNA is generally detectable in upper respiratory specimens during the acute phase of infection. The lowest concentration of SARS-CoV-2 viral copies this assay can detect is 138 copies/mL. A negative result does not preclude SARS-Cov-2 infection and should not be used as the sole basis for treatment or other patient management decisions. A negative result may occur with  improper specimen collection/handling, submission of specimen other than nasopharyngeal swab, presence of viral mutation(s) within the areas targeted by this assay, and inadequate number of viral copies(<138 copies/mL). A negative result must be combined with clinical observations, patient history, and epidemiological information. The expected result is Negative.  Fact Sheet for Patients:  EntrepreneurPulse.com.au  Fact Sheet for Healthcare Providers:  IncredibleEmployment.be  This test is no t yet approved or cleared by the [place] FDA and  has been authorized for detection and/or diagnosis of SARS-CoV-2 by FDA under an Emergency Use Authorization (EUA). This EUA will remain  in effect (meaning this test can be used) for the duration of the COVID-19 declaration under Section 564(b)(1) of the Act, 21 U.S.C.section 360bbb-3(b)(1), unless the authorization is terminated  or revoked sooner.       Influenza A by PCR NEGATIVE NEGATIVE Final   Influenza B by PCR NEGATIVE NEGATIVE Final    Comment: (NOTE) The Xpert Xpress SARS-CoV-2/FLU/RSV plus assay is intended as an aid in the diagnosis of influenza from Nasopharyngeal swab specimens and should not be used as a sole basis for treatment. Nasal washings and aspirates are unacceptable for Xpert Xpress SARS-CoV-2/FLU/RSV testing.  Fact Sheet for Patients: EntrepreneurPulse.com.au  Fact Sheet for Healthcare  Providers: IncredibleEmployment.be  This test  is not yet approved or cleared by the [place] and has been authorized for detection and/or diagnosis of SARS-CoV-2 by FDA under an Emergency Use Authorization (EUA). This EUA will remain in effect (meaning this test can be used) for the duration of the COVID-19 declaration under Section 564(b)(1) of the Act, 21 U.S.C. section 360bbb-3(b)(1), unless the authorization is terminated or revoked.  Performed at [facility], [address] [place], [place] [address]   Blood Culture (routine x 2)     Status: None (Preliminary result)   Collection Time: 05/04/21  6:21 PM   Specimen: BLOOD  Result Value Ref Range Status   Specimen Description BLOOD RIGHT [facility]  Final   Special Requests   Final    BOTTLES DRAWN AEROBIC AND ANAEROBIC Blood Culture adequate volume   Culture   Final    NO GROWTH < 12 HOURS Performed at [facility], [address] [place], [place] [address]    Report Status PENDING  Incomplete  Blood Culture (routine x 2)     Status: None (Preliminary result)   Collection Time: 05/04/21  6:26 PM   Specimen: BLOOD  Result Value Ref Range Status   Specimen Description BLOOD LEFT HAND  Final   Special Requests   Final    BOTTLES DRAWN AEROBIC AND ANAEROBIC Blood Culture adequate volume   Culture   Final    NO GROWTH < 12 HOURS Performed at [facility], [address] [place], [place] [address]    Report Status PENDING  Incomplete  MRSA Next Gen by PCR, Nasal     Status: None   Collection Time: 05/05/21  8:50 AM   Specimen: Nasal Mucosa; Nasal Swab  Result Value Ref Range Status   MRSA by PCR Next Gen NOT DETECTED NOT DETECTED Final    Comment: (NOTE) The GeneXpert MRSA Assay (FDA approved for NASAL specimens only), is one component of a comprehensive MRSA colonization surveillance program. It is not intended to diagnose MRSA infection nor to guide or monitor  treatment for MRSA infections. Test performance is not FDA approved in patients less than 95 years old. Performed at [facility], [address] [place], [place] [address]     Procedures and diagnostic studies:  DG Chest [facility] 1 View  Result Date: 05/05/2021 CLINICAL DATA:  Dyspnea EXAM: PORTABLE CHEST 1 VIEW COMPARISON:  05/04/2021 FINDINGS: Since the prior examination, there has been little change. Lung volumes are small, however, pulmonary insufflation is stable. Interstitial thickening persists, likely related to underlying interstitial disease compounded by pulmonary hypoinflation. Mild left basilar atelectasis is present. No pneumothorax or pleural effusion. Cardiac size is mildly enlarged, unchanged. Pulmonary vascularity is normal. No acute bone abnormality. IMPRESSION: Stable pulmonary hypoinflation.  Left basilar atelectasis. Stable cardiomegaly. Electronically Signed   By: Fidela Salisbury M.D.   On: 05/05/2021 02:22   DG Chest Port 1 View  Result Date: 05/04/2021 CLINICAL DATA:  Sepsis EXAM: PORTABLE CHEST 1 VIEW COMPARISON:  03/07/2021 FINDINGS: Lung volumes are small and pulmonary insufflation has diminished since prior examination. There is diffuse interstitial thickening which may relate to pulmonary hypoinflation. Superimposed mild left basilar atelectasis or infiltrate. No pneumothorax or pleural effusion. Cardiomegaly is stable when accounting for poor pulmonary insufflation. Superior mediastinal widening, again, likely relates to poor pulmonary insufflation. No acute bone abnormality. IMPRESSION: Pulmonary hypoinflation. Mild left basilar atelectasis or infiltrate. Stable cardiomegaly. Electronically Signed   By: Fidela Salisbury M.D.   On: 05/04/2021 19:06  LOS: 2 days   Ziyan Hillmer  [facility]   Pager on [email or web address]. If 7PM-7AM, please contact night-coverage at [email or web address]     05/06/2021, 8:51 AM

## 2021-05-06 NOTE — Progress Notes (Signed)
PHARMACIST - PHYSICIAN COMMUNICATION  CONCERNING: Antibiotic IV to Oral Route Change Policy  RECOMMENDATION: This patient is receiving azithromycin by the intravenous route.  Based on criteria approved by the Pharmacy and Therapeutics Committee, the antibiotic(s) is/are being converted to the equivalent oral dose form(s).   DESCRIPTION: These criteria include: Patient being treated for a respiratory tract infection, urinary tract infection, cellulitis or clostridium difficile associated diarrhea if on metronidazole The patient is not neutropenic and does not exhibit a GI malabsorption state The patient is eating (either orally or via tube) and/or has been taking other orally administered medications for a least 24 hours The patient is improving clinically and has a Tmax < 100.5  If you have questions about this conversion, please contact the [place]  05/06/21

## 2021-05-07 DIAGNOSIS — I4891 Unspecified atrial fibrillation: Secondary | ICD-10-CM | POA: Diagnosis not present

## 2021-05-07 DIAGNOSIS — A419 Sepsis, unspecified organism: Secondary | ICD-10-CM | POA: Diagnosis not present

## 2021-05-07 DIAGNOSIS — R6520 Severe sepsis without septic shock: Secondary | ICD-10-CM | POA: Diagnosis not present

## 2021-05-07 DIAGNOSIS — N3001 Acute cystitis with hematuria: Secondary | ICD-10-CM | POA: Diagnosis not present

## 2021-05-07 LAB — URINE CULTURE: Culture: 20000 — AB

## 2021-05-07 MED ORDER — MORPHINE SULFATE 15 MG PO TABS: 15.0000 mg | ORAL_TABLET | ORAL | Status: DC | PRN

## 2021-05-07 MED ORDER — SULFAMETHOXAZOLE-TRIMETHOPRIM 800-160 MG PO TABS
1.0000 | ORAL_TABLET | Freq: Two times a day (BID) | ORAL | Status: DC
  Administered 2021-05-07 – 2021-05-09 (×4): 1 via ORAL
  Filled 2021-05-07 (×5): qty 1

## 2021-05-07 MED ORDER — OXYCODONE HCL 5 MG PO TABS
5.0000 mg | ORAL_TABLET | Freq: Four times a day (QID) | ORAL | Status: DC | PRN
  Administered 2021-05-08 – 2021-05-09 (×3): 5 mg via ORAL
  Filled 2021-05-07 (×4): qty 1

## 2021-05-07 NOTE — Evaluation (Signed)
Clinical/Bedside Swallow Evaluation Patient Details  Name: [name] MRN: [id number] Date of Birth: Sep 03, 1940  Today's Date: 05/07/2021 Time: SLP Start Time (ACUTE ONLY): 31 SLP Stop Time (ACUTE ONLY): 1215 SLP Time Calculation (min) (ACUTE ONLY): 45 min  Past Medical History:  Past Medical History:  Diagnosis Date   AAA (abdominal aortic aneurysm)    Anticoagulant long-term use    Failed on Coumadin. On Xarelto   Anxiety    Anxiety and depression    Atrial fib/flutter, transient June 2012   Chronic lower back pain    COPD (chronic obstructive pulmonary disease) ([place])    DVT (deep venous thrombosis) ([place]) 2004   BLE   Esophageal dysmotility    Exertional dyspnea    Hiatal hernia    History of bronchitis    History of fibrocystic disease of breast    History of uterine fibroid    HTN (hypertension)    Hyperlipidemia    Hypothyroidism    Normal nuclear stress test 2012   May 2012   OA (osteoarthritis)    "knees; left shoulder"   OSA (obstructive sleep apnea)    "haven't been using my CPAP lately" (01/21/12)   Ovarian mass    right benign   Ovarian mass    benign, right   PAD (peripheral artery disease) ([place])    Small cell carcinoma of lung ([place]) 2004   NON-SMALL CELL CARCINOMA OF THE LUNG, METASTATIC TO THE SUPRACLAVICULAR AND MEDIASTINAL LYMPH NODES; in remission   Past Surgical History:  Past Surgical History:  Procedure Laterality Date   ABDOMINAL AORTIC ANEURYSM REPAIR  ~ 2010   stent graft   BLADDER SURGERY  ~ 2003   sling   BREAST BIOPSY  1960's   both breast's - benign   CARDIOVASCULAR STRESS TEST  2012   No ischemia   CATARACT EXTRACTION W/ INTRAOCULAR LENS  IMPLANT, BILATERAL Bilateral ~ 2009   REPLACEMENT TOTAL KNEE Left ~ 2008   left   THYROIDECTOMY  ~ 1964   HPI:  Pt is a 80 year old who presents from [facility] facility with altered mental status preceded by 3 to 4 days of increasing weakness.  She was hypoxic with EMS on her home  dose of 3 L and was in rapid A. fib.  PMH + for COPD, lung cancer s/p chemoradiation in 2005 per pt, sleep apnea (pt not using CPAP much d/t respiratory infection per pt), Afib, OEA, HH, depression/anxiety, thyroidectomy, thrrombocytopenia, hypothyroidism, AAA. Pt underwent an esophagram Apr 25, 2008 indicating mild to moderate Presbyesophagus, small pulsion diverticulum upper thoracic esophagus may related to previous rad tx, no hiatal hernia.  Pt reports this type of dysphagia "for years".   CXR at admit: Chest x-ray: Pulmonary hypoinflation, mild left basilar atelectasis or infiltrate. Stable cardiomegaly.    Assessment / Plan / Recommendation  Clinical Impression  Pt appears to present w/ adequate oropharyngeal phase swallow w/ No oropharyngeal phase dysphagia noted, No neuromuscular deficits noted. Pt consumed po trials w/ No overt, clinical s/s of aspiration during po trials. Pt appears at reduced risk for aspiration from an oropharyngeal phase standpoint following general aspiration precautions.   OF NOTE: Pt has a baseline of REFLUX/GERD and Esophageal phase Dysmotility as per DG Esophagus in 2009 indicating "mild to moderate Presbyesophagus, small pulsion diverticulum upper thoracic esophagus may related to previous rad tx, no hiatal hernia.".  Pt reports this type of dysphagia "for years". She is not currently on a PPI per chart. ANY  Dysmotility or Regurgitation of Reflux material can increase risk for aspiration of the Reflux material during Retrograde flow thus impact Pulmonary status.        During po trials, pt consumed all consistencies w/ No overt coughing, decline in vocal quality, or change in respiratory presentation during/post trials. Oral phase appeared grossly [facility] w/ timely bolus management, mastication/mashing, and control of bolus propulsion for A-P transfer for swallowing. Oral clearing achieved w/ all trial consistencies given min extra Time for mastication of softened solids d/t  missing Dentition. OM Exam appeared [facility] w/ no unilateral weakness noted. Speech Clear. Pt fed self w/ setup support.  Recommend a Mech Soft/Regular consistency diet w/ well-Cut meats, moistened foods; Thin liquids VIA CUP - Straws are NOT recommended for less air swallowing and improved oral control when drinking. Recommend continue Regluar diet for ease of choice of manageable foods as tolerates(w/ less meats/breads in diet IF problematic d/t bulk). General aspiration precautions. Rest Breaks during meals/oral intake to allow for Esophageal clearing. STRICT REFLUX PRECAUTIONS strongly recommended to lessen chance for Regurgitation. Recommend pt f/u w/ GI for management of Reflux and tx as indicated. Handouts given. No further skilled ST services indicated at this time; NSG to reconsult if any new needs while admitted. Pt agreed. SLP Visit Diagnosis: Dysphagia, unspecified (R13.10) (Esophageal dysmotility baseline)    Aspiration Risk  Mild aspiration risk;Risk for inadequate nutrition/hydration (reduced following REFLUX precautions; general aspiration precautions)    Diet Recommendation   Mech Soft/Regular consistency diet w/ well-Cut meats, moistened foods; Thin liquids VIA CUP - Straws are NOT recommended for less air swallowing and improved oral control when drinking. General aspiration precautions. Rest Breaks during meals/oral intake to allow for Esophageal clearing. STRICT REFLUX PRECAUTIONS strongly recommended to lessen chance for Regurgitation.   Medication Administration: Whole meds with puree (for safer swallowing)    Other  Recommendations Recommended Consults: Consider GI evaluation;Consider esophageal assessment (Dietician f/u) Oral Care Recommendations: Oral care BID;Oral care before and after PO;Patient independent with oral care Other Recommendations:  (n/a)    Recommendations for follow up therapy are one component of a multi-disciplinary discharge planning process, led by the  attending physician.  Recommendations may be updated based on patient status, additional functional criteria and insurance authorization.  Follow up Recommendations No SLP follow up      Assistance Recommended at Discharge None  Functional Status Assessment Patient has had a recent decline in their functional status and demonstrates the ability to make significant improvements in function in a reasonable and predictable amount of time.  Frequency and Duration  (n/a)   (n/a)       Prognosis Prognosis for Safe Diet Advancement: Fair (-Good) Barriers to Reach Goals: Time post onset;Severity of deficits Barriers/Prognosis Comment: Esophageal phase dysmotility baseline      Swallow Study   General Date of Onset: 05/04/21 HPI: Pt is a 80 year old who presents from [facility] facility with altered mental status preceded by 3 to 4 days of increasing weakness.  She was hypoxic with EMS on her home dose of 3 L and was in rapid A. fib.  PMH + for COPD, lung cancer s/p chemoradiation in 2005 per pt, sleep apnea (pt not using CPAP much d/t respiratory infection per pt), Afib, OEA, HH, depression/anxiety, thyroidectomy, thrrombocytopenia, hypothyroidism, AAA. Pt underwent an esophagram Apr 25, 2008 indicating mild to moderate Presbyesophagus, small pulsion diverticulum upper thoracic esophagus may related to previous rad tx, no hiatal hernia.  Pt reports this type of  dysphagia "for years".   CXR at admit: Chest x-ray: Pulmonary hypoinflation, mild left basilar atelectasis or infiltrate. Stable cardiomegaly. Type of Study: Bedside Swallow Evaluation Previous Swallow Assessment: MBSS in 2012: functional oropharyngeal phase swallow and primary Esopahgeal phase dysmotility w/ bolus stasis. Baseline Esopahgeal phase deficits and dysmotility per chart notes. Diet Prior to this Study: Regular;Thin liquids Temperature Spikes Noted: No (wbc 4.8) Respiratory Status: Nasal cannula (4L) History of Recent  Intubation: No Behavior/Cognition: Alert;Cooperative;Pleasant mood Oral Cavity Assessment: Within Functional Limits Oral Care Completed by SLP: Yes Oral Cavity - Dentition: Poor condition;Missing dentition (few front lower teeth) Vision: Functional for self-feeding Self-Feeding Abilities: Able to feed self;Needs assist;Needs set up Patient Positioning: Upright in bed (EOB positioning given) Baseline Vocal Quality: Normal Volitional Cough: Strong Volitional Swallow: Able to elicit    Oral/Motor/Sensory Function Overall Oral Motor/Sensory Function: Within functional limits   Ice Chips Ice chips: Not tested   Thin Liquid Thin Liquid: Within functional limits Presentation: Cup;Self Fed (8 trials accepted) Other Comments: NO STRAW    Nectar Thick Nectar Thick Liquid: Not tested   Honey Thick Honey Thick Liquid: Not tested   Puree Puree: Within functional limits Presentation: Self Fed;Spoon (4 trials)   Solid     Solid: Impaired (missing dentition) Presentation: Self Fed;Spoon (3 trials) Oral Phase Impairments: Impaired mastication (missing dentition) Oral Phase Functional Implications:  (lengthier time for mastication but adequate) Pharyngeal Phase Impairments:  (none)        Orinda Kenner, MS, [employer] Speech Language Pathologist Rehab Services [phone] 05/07/2021,5:09 PM

## 2021-05-07 NOTE — Care Management Important Message (Signed)
Important Message  Patient Details  Name: [name] MRN: [id number] Date of Birth: 27-Aug-1940   Medicare Important Message Given:  N/A - LOS <3 / Initial given by admissions     [name] 05/07/2021, 11:17 AM

## 2021-05-07 NOTE — Progress Notes (Signed)
Report called to [facility], Therapist, sports . Pt transferring to [facility].

## 2021-05-07 NOTE — Progress Notes (Signed)
Progress Note    [name]  KXF:[id number] DOB: September 27, 1940  DOA: 05/04/2021 PCP: Burnard Bunting, MD      Brief Narrative:    Medical records reviewed and are as summarized below:  [name] is a 80 y.o. female with medical history significant for chronic combined systolic and diastolic CHF (EF 35 to 69% in September 2022) COPD, atrial fibrillation on Eliquis, chronic hypoxemic respiratory failure on 3 to 4 L/min oxygen, anxiety, hypertension, hypothyroidism, small cell lung cancer, OSA, PVD.  She presented to the hospital for assisted-living facility because of altered mental status and generalized weakness.  When EMS picked her up, she was hypoxemic and was in rapid atrial fibrillation.  She was admitted to the hospital for severe sepsis secondary to UTI complicated by acute metabolic encephalopathy and acute hypoxemic respiratory failure.    Assessment/Plan:   Principal Problem:   Severe sepsis (HCC) Active Problems:   COPD (chronic obstructive pulmonary disease) (HCC)   Atrial fibrillation with RVR (HCC)   Chronic combined systolic and diastolic CHF (congestive heart failure) (HCC)   Small cell carcinoma of lung (HCC)   Acute on chronic respiratory failure with hypoxia (HCC)   Acute metabolic encephalopathy   UTI (urinary tract infection)   Anticoagulant long-term use   Hyperbilirubinemia    Body mass index is 33.2 kg/m.  (Obesity)  Severe sepsis secondary to acute UTI: Urine culture showed ESBL E. coli.  Switch antibiotics from IV Rocephin to Bactrim.  Discussed with Rachel Bo, pharmacist.    Atrial fibrillation with RVR: Heart rate is better.  Continue metoprolol and Eliquis.  Acute metabolic encephalopathy: Continue supportive care.  Acute on chronic hypoxemic respiratory failure: She is on 4 L/min oxygen via nasal cannula which is about her baseline.  COPD, chronic systolic and diastolic CHF: Compensated.  Continue bronchodilators  Debility:  Consult PT  History of small cell lung cancer  Diet Order             Diet 2 gram sodium Room service appropriate? Yes with Assist; Fluid consistency: Thin  Diet effective now                      Consultants: None  Procedures: None    Medications:    apixaban  5 mg Oral BID   Chlorhexidine Gluconate Cloth  6 each Topical Daily   citalopram  20 mg Oral Daily   digoxin  125 mcg Oral Daily   feeding supplement  237 mL Oral BID BM   furosemide  40 mg Oral BID   guaiFENesin  1,200 mg Oral BID   levothyroxine  88 mcg Oral Daily   megestrol  40 mg Oral BID   metoprolol tartrate  25 mg Oral BID   multivitamin with minerals  1 tablet Oral Daily   potassium chloride  10 mEq Oral BID   sulfamethoxazole-trimethoprim  1 tablet Oral Q12H   tiotropium  18 mcg Inhalation Daily   vitamin B-12  100 mcg Oral Daily   Continuous Infusions:     Anti-infectives (From admission, onward)    Start     Dose/Rate Route Frequency Ordered Stop   05/07/21 1600  sulfamethoxazole-trimethoprim (BACTRIM DS) 800-160 MG per tablet 1 tablet        1 tablet Oral Every 12 hours 05/07/21 1429 05/11/21 0959   05/06/21 2200  azithromycin (ZITHROMAX) tablet 500 mg  Status:  Discontinued        500 mg  Oral Daily at bedtime 05/06/21 0942 05/06/21 1154   05/05/21 0200  cefTRIAXone (ROCEPHIN) 2 g in sodium chloride 0.9 % 100 mL IVPB  Status:  Discontinued        2 g 200 mL/hr over 30 Minutes Intravenous Every 24 hours 05/04/21 2218 05/07/21 1429   05/04/21 2230  azithromycin (ZITHROMAX) 500 mg in sodium chloride 0.9 % 250 mL IVPB  Status:  Discontinued        500 mg 250 mL/hr over 60 Minutes Intravenous Every 24 hours 05/04/21 2218 05/06/21 0942   05/04/21 1830  vancomycin (VANCOCIN) IVPB 1000 mg/200 mL premix        1,000 mg 200 mL/hr over 60 Minutes Intravenous  Once 05/04/21 1822 05/04/21 2003   05/04/21 1830  ceFEPIme (MAXIPIME) 2 g in sodium chloride 0.9 % 100 mL IVPB        2 g 200 mL/hr  over 30 Minutes Intravenous  Once 05/04/21 1822 05/04/21 1908              Family Communication/Anticipated D/C date and plan/Code Status   DVT prophylaxis:  apixaban (ELIQUIS) tablet 5 mg     Code Status: Full Code  Family Communication: None Disposition Plan: Discharge to home in 1 to 2 days   Status is: Inpatient  Remains inpatient appropriate because: Debility, intermittent confusion           Subjective:   Interval events noted.  She complains of cough.  No shortness of breath or chest pain.  She feels a little better today.  Objective:    Vitals:   05/06/21 2300 05/07/21 0000 05/07/21 0140 05/07/21 0841  BP: (!) 152/86 130/87 (!) 126/95 (!) 124/99  Pulse: 62  67 (!) 101  Resp: (!) 22 20 20 20   Temp:   97.7 F (36.5 C) 97.6 F (36.4 C)  TempSrc:   Oral   SpO2: 96% 96% 95% 95%  Weight:      Height:       No data found.   Intake/Output Summary (Last 24 hours) at 05/07/2021 1527 Last data filed at 05/07/2021 1104 Gross per 24 hour  Intake 340 ml  Output 600 ml  Net -260 ml   Filed Weights   05/04/21 1851 05/05/21 0833  Weight: 92.8 kg 93.3 kg    Exam:  GEN: NAD SKIN: Warm and dry EYES: EOMI ENT: MMM CV: Irregular rate and rhythm PULM: CTA B ABD: soft, obese, NT, +BS CNS: AAO x 3, non focal EXT: No edema or tenderness          Data Reviewed:   I have personally reviewed following labs and imaging studies:  Labs: Labs show the following:   Basic Metabolic Panel: Recent Labs  Lab 05/04/21 1816 05/05/21 0647  NA 135 137  K 4.2 3.8  CL 99 101  CO2 27 28  GLUCOSE 118* 142*  BUN 24* 20  CREATININE 0.97 0.76  CALCIUM 9.4 8.9  MG  --  2.1   GFR Estimated Creatinine Clearance: 62.4 mL/min (by C-G formula based on SCr of 0.76 mg/dL). Liver Function Tests: Recent Labs  Lab 05/04/21 1816 05/05/21 0647  AST 48* 39  ALT 19 16  ALKPHOS 44 37*  BILITOT 2.3* 1.4*  PROT 8.1 6.7  ALBUMIN 3.9 3.0*   No results  for input(s): LIPASE, AMYLASE in the last 168 hours. No results for input(s): AMMONIA in the last 168 hours. Coagulation profile Recent Labs  Lab 05/04/21 1816 05/05/21 6283  INR 2.5* 2.3*    CBC: Recent Labs  Lab 05/04/21 1816 05/05/21 0647  WBC 10.5 7.8  NEUTROABS 8.2*  --   HGB 16.5* 14.9  HCT 48.6* 45.1  MCV 105.0* 105.4*  PLT 177 141*   Cardiac Enzymes: No results for input(s): CKTOTAL, CKMB, CKMBINDEX, TROPONINI in the last 168 hours. BNP (last 3 results) No results for input(s): PROBNP in the last 8760 hours. CBG: Recent Labs  Lab 05/05/21 0828  GLUCAP 129*   D-Dimer: No results for input(s): DDIMER in the last 72 hours. Hgb A1c: No results for input(s): HGBA1C in the last 72 hours. Lipid Profile: No results for input(s): CHOL, HDL, LDLCALC, TRIG, CHOLHDL, LDLDIRECT in the last 72 hours. Thyroid function studies: No results for input(s): TSH, T4TOTAL, T3FREE, THYROIDAB in the last 72 hours.  Invalid input(s): FREET3 Anemia work up: No results for input(s): VITAMINB12, FOLATE, FERRITIN, TIBC, IRON, RETICCTPCT in the last 72 hours. Sepsis Labs: Recent Labs  Lab 05/04/21 1816 05/04/21 1821 05/04/21 2000 05/05/21 0246 05/05/21 0647  PROCALCITON  --   --   --   --  <0.10  WBC 10.5  --   --   --  7.8  LATICACIDVEN  --  2.0* 2.1* 6.3* 1.8    Microbiology Recent Results (from the past 240 hour(s))  Urine Culture     Status: Abnormal   Collection Time: 05/04/21  6:15 PM   Specimen: In/Out Cath Urine  Result Value Ref Range Status   Specimen Description   Final    IN/OUT CATH URINE Performed at [facility], [address], [place], [place] [address]    Special Requests   Final    NONE Performed at [facility], [address] [place], [place] [address]    Culture (A)  Final    20,000 COLONIES/mL ESCHERICHIA COLI Confirmed Extended Spectrum Beta-Lactamase Producer (ESBL).  In bloodstream infections from ESBL organisms,  carbapenems are preferred over piperacillin/tazobactam. They are shown to have a lower risk of mortality. 60,000 COLONIES/mL LACTOBACILLUS SPECIES Standardized susceptibility testing for this organism is not available. Performed at [facility], [place] [address], [place], [place] [address]    Report Status 05/07/2021 FINAL  Final   Organism ID, Bacteria ESCHERICHIA COLI (A)  Final      Susceptibility   Escherichia coli - MIC*    AMPICILLIN >=32 RESISTANT Resistant     CEFAZOLIN >=64 RESISTANT Resistant     CEFEPIME <=0.12 SENSITIVE Sensitive     CEFTRIAXONE 0.5 SENSITIVE Sensitive     CIPROFLOXACIN <=0.25 SENSITIVE Sensitive     GENTAMICIN <=1 SENSITIVE Sensitive     IMIPENEM <=0.25 SENSITIVE Sensitive     NITROFURANTOIN <=16 SENSITIVE Sensitive     TRIMETH/SULFA <=20 SENSITIVE Sensitive     AMPICILLIN/SULBACTAM >=32 RESISTANT Resistant     PIP/TAZO 8 SENSITIVE Sensitive     * 20,000 COLONIES/mL ESCHERICHIA COLI  Resp Panel by RT-PCR (Flu A&B, Covid) Nasopharyngeal Swab     Status: None   Collection Time: 05/04/21  6:21 PM   Specimen: Nasopharyngeal Swab; Nasopharyngeal(NP) swabs in vial transport medium  Result Value Ref Range Status   SARS Coronavirus 2 by RT PCR NEGATIVE NEGATIVE Final    Comment: (NOTE) SARS-CoV-2 target nucleic acids are NOT DETECTED.  The SARS-CoV-2 RNA is generally detectable in upper respiratory specimens during the acute phase of infection. The lowest concentration of SARS-CoV-2 viral copies this assay can detect is 138 copies/mL. A negative result does not  preclude SARS-Cov-2 infection and should not be used as the sole basis for treatment or other patient management decisions. A negative result may occur with  improper specimen collection/handling, submission of specimen other than nasopharyngeal swab, presence of viral mutation(s) within the areas targeted by this assay, and inadequate number of viral copies(<138 copies/mL). A negative result  must be combined with clinical observations, patient history, and epidemiological information. The expected result is Negative.  Fact Sheet for Patients:  EntrepreneurPulse.com.au  Fact Sheet for Healthcare Providers:  IncredibleEmployment.be  This test is no t yet approved or cleared by the [place] FDA and  has been authorized for detection and/or diagnosis of SARS-CoV-2 by FDA under an Emergency Use Authorization (EUA). This EUA will remain  in effect (meaning this test can be used) for the duration of the COVID-19 declaration under Section 564(b)(1) of the Act, 21 U.S.C.section 360bbb-3(b)(1), unless the authorization is terminated  or revoked sooner.       Influenza A by PCR NEGATIVE NEGATIVE Final   Influenza B by PCR NEGATIVE NEGATIVE Final    Comment: (NOTE) The Xpert Xpress SARS-CoV-2/FLU/RSV plus assay is intended as an aid in the diagnosis of influenza from Nasopharyngeal swab specimens and should not be used as a sole basis for treatment. Nasal washings and aspirates are unacceptable for Xpert Xpress SARS-CoV-2/FLU/RSV testing.  Fact Sheet for Patients: EntrepreneurPulse.com.au  Fact Sheet for Healthcare Providers: IncredibleEmployment.be  This test is not yet approved or cleared by the [place] FDA and has been authorized for detection and/or diagnosis of SARS-CoV-2 by FDA under an Emergency Use Authorization (EUA). This EUA will remain in effect (meaning this test can be used) for the duration of the COVID-19 declaration under Section 564(b)(1) of the Act, 21 U.S.C. section 360bbb-3(b)(1), unless the authorization is terminated or revoked.  Performed at [facility], [address] [place], [place] [address]   Blood Culture (routine x 2)     Status: None (Preliminary result)   Collection Time: 05/04/21  6:21 PM   Specimen: BLOOD  Result Value Ref Range Status    Specimen Description BLOOD RIGHT [facility]  Final   Special Requests   Final    BOTTLES DRAWN AEROBIC AND ANAEROBIC Blood Culture adequate volume   Culture   Final    NO GROWTH 3 DAYS Performed at [facility], [address] [place], [place] [address]    Report Status PENDING  Incomplete  Blood Culture (routine x 2)     Status: None (Preliminary result)   Collection Time: 05/04/21  6:26 PM   Specimen: BLOOD  Result Value Ref Range Status   Specimen Description BLOOD LEFT HAND  Final   Special Requests   Final    BOTTLES DRAWN AEROBIC AND ANAEROBIC Blood Culture adequate volume   Culture   Final    NO GROWTH 3 DAYS Performed at [facility], [address], [place], [place] [address]    Report Status PENDING  Incomplete  MRSA Next Gen by PCR, Nasal     Status: None   Collection Time: 05/05/21  8:50 AM   Specimen: Nasal Mucosa; Nasal Swab  Result Value Ref Range Status   MRSA by PCR Next Gen NOT DETECTED NOT DETECTED Final    Comment: (NOTE) The GeneXpert MRSA Assay (FDA approved for NASAL specimens only), is one component of a comprehensive MRSA colonization surveillance program. It is not intended to diagnose MRSA infection nor to guide or monitor treatment for MRSA infections.  Test performance is not FDA approved in patients less than 41 years old. Performed at [facility], [address] [place], [place] [address]     Procedures and diagnostic studies:  No results found.             LOS: 3 days   Yigit Norkus  [facility]   Pager on [email or web address]. If 7PM-7AM, please contact night-coverage at [email or web address]     05/07/2021, 3:27 PM

## 2021-05-08 DIAGNOSIS — R6520 Severe sepsis without septic shock: Secondary | ICD-10-CM | POA: Diagnosis not present

## 2021-05-08 DIAGNOSIS — A419 Sepsis, unspecified organism: Secondary | ICD-10-CM | POA: Diagnosis not present

## 2021-05-08 MED ORDER — PANTOPRAZOLE SODIUM 40 MG PO TBEC
40.0000 mg | DELAYED_RELEASE_TABLET | Freq: Every day | ORAL | Status: DC
  Administered 2021-05-09: 10:00:00 40 mg via ORAL
  Filled 2021-05-08: qty 1

## 2021-05-08 NOTE — Progress Notes (Signed)
PROGRESS NOTE    [name]  WUJ:[id number] DOB: [birth year] DOA: 05/04/2021 PCP: Burnard Bunting, MD    Brief Narrative:  80 y.o. female with medical history significant for chronic combined systolic and diastolic CHF (EF 35 to 95% in September 2022) COPD, atrial fibrillation on Eliquis, chronic hypoxemic respiratory failure on 3 to 4 L/min oxygen, anxiety, hypertension, hypothyroidism, small cell lung cancer, OSA, PVD.  She presented to the hospital for assisted-living facility because of altered mental status and generalized weakness.  When EMS picked her up, she was hypoxemic and was in rapid atrial fibrillation.   She was admitted to the hospital for severe sepsis secondary to UTI complicated by acute metabolic encephalopathy and acute hypoxemic respiratory failure.    Overall improving.  As of 11/16 not yet at baseline.   Assessment & Plan:   Principal Problem:   Severe sepsis ([place]) Active Problems:   COPD (chronic obstructive pulmonary disease) (HCC)   Atrial fibrillation with RVR (HCC)   Chronic combined systolic and diastolic CHF (congestive heart failure) (HCC)   Small cell carcinoma of lung (HCC)   Acute on chronic respiratory failure with hypoxia (HCC)   Acute metabolic encephalopathy   UTI (urinary tract infection)   Anticoagulant long-term use   Hyperbilirubinemia  Severe sepsis secondary to UTI Urine culture with ESBL E. Coli Was on Rocephin, switched to Bactrim Plan: Continue Bactrim No IV fluids in setting of chronic heart failure Monitor vitals and fever curve Hopeful to discharge in 24 hours  Atrial fibrillation rapid ventricular response Heart rate improved Continue metoprolol and Eliquis  Acute metabolic encephalopathy Presumably secondary to sepsis Mental status improving  Acute on chronic hypoxemic respiratory failure Patient back on baseline 3 to 4 L oxygen via nasal cannula  COPD Not exacerbated Continue as needed  bronchodilators  Chronic combined systolic and diastolic congestive heart failure Not acutely exacerbated Continue Lasix 40 p.o. twice daily  Functional decline Therapy evaluations  History of small cell lung cancer Outpatient oncology follow-up   DVT prophylaxis: Eliquis Code Status: Full Family Communication:Daughter Anne Ng [phone] Disposition Plan: Status is: Inpatient  Remains inpatient appropriate because: Sepsis physiology improving.  Patient not yet at baseline.  Anticipated date of discharge 11/17       Level of care: Med-Surg  Consultants:  None  Procedures:  None  Antimicrobials: Bactrim   Subjective: Seen and examined.  Sitting comfortably in bed.  No visible distress.  No pain complaints  Objective: Vitals:   05/08/21 0017 05/08/21 0551 05/08/21 0821 05/08/21 1223  BP: (!) 140/95 120/71 (!) 131/117 (!) 141/100  Pulse: 82 83 (!) 49 86  Resp: 19 17 18 19   Temp: (!) 97.4 F (36.3 C) 97.8 F (36.6 C) 98.5 F (36.9 C) 97.9 F (36.6 C)  TempSrc:      SpO2: 92% 90% 90% 93%  Weight:      Height:        Intake/Output Summary (Last 24 hours) at 05/08/2021 1401 Last data filed at 05/08/2021 1234 Gross per 24 hour  Intake 120 ml  Output 1950 ml  Net -1830 ml   Filed Weights   05/04/21 1851 05/05/21 0833  Weight: 92.8 kg 93.3 kg    Examination:  General exam: No acute distress.  Appears frail Respiratory system: Tachypneic.  Lungs clear.  Normal work of breathing.  2 L Cardiovascular system: S1-S2, RRR, no murmurs, trace pitting edema Gastrointestinal system: Soft, NT/ND, normal bowel sounds Central nervous system: Alert and oriented. No focal neurological  deficits. Extremities: Symmetric 5 x 5 power. Skin: No rashes, lesions or ulcers Psychiatry: Judgement and insight appear normal. Mood & affect appropriate.     Data Reviewed: I have personally reviewed following labs and imaging studies  CBC: Recent Labs  Lab 05/04/21 1816  05/05/21 0647  WBC 10.5 7.8  NEUTROABS 8.2*  --   HGB 16.5* 14.9  HCT 48.6* 45.1  MCV 105.0* 105.4*  PLT 177 160*   Basic Metabolic Panel: Recent Labs  Lab 05/04/21 1816 05/05/21 0647  NA 135 137  K 4.2 3.8  CL 99 101  CO2 27 28  GLUCOSE 118* 142*  BUN 24* 20  CREATININE 0.97 0.76  CALCIUM 9.4 8.9  MG  --  2.1   GFR: Estimated Creatinine Clearance: 62.4 mL/min (by C-G formula based on SCr of 0.76 mg/dL). Liver Function Tests: Recent Labs  Lab 05/04/21 1816 05/05/21 0647  AST 48* 39  ALT 19 16  ALKPHOS 44 37*  BILITOT 2.3* 1.4*  PROT 8.1 6.7  ALBUMIN 3.9 3.0*   No results for input(s): LIPASE, AMYLASE in the last 168 hours. No results for input(s): AMMONIA in the last 168 hours. Coagulation Profile: Recent Labs  Lab 05/04/21 1816 05/05/21 0647  INR 2.5* 2.3*   Cardiac Enzymes: No results for input(s): CKTOTAL, CKMB, CKMBINDEX, TROPONINI in the last 168 hours. BNP (last 3 results) No results for input(s): PROBNP in the last 8760 hours. HbA1C: No results for input(s): HGBA1C in the last 72 hours. CBG: Recent Labs  Lab 05/05/21 0828  GLUCAP 129*   Lipid Profile: No results for input(s): CHOL, HDL, LDLCALC, TRIG, CHOLHDL, LDLDIRECT in the last 72 hours. Thyroid Function Tests: No results for input(s): TSH, T4TOTAL, FREET4, T3FREE, THYROIDAB in the last 72 hours. Anemia Panel: No results for input(s): VITAMINB12, FOLATE, FERRITIN, TIBC, IRON, RETICCTPCT in the last 72 hours. Sepsis Labs: Recent Labs  Lab 05/04/21 1821 05/04/21 2000 05/05/21 0246 05/05/21 0647  PROCALCITON  --   --   --  <0.10  LATICACIDVEN 2.0* 2.1* 6.3* 1.8    Recent Results (from the past 240 hour(s))  Urine Culture     Status: Abnormal   Collection Time: 05/04/21  6:15 PM   Specimen: In/Out Cath Urine  Result Value Ref Range Status   Specimen Description   Final    IN/OUT CATH URINE Performed at [facility], [address] [place], [place] [address]     Special Requests   Final    NONE Performed at [facility], [address] [place], [place] [address]    Culture (A)  Final    20,000 COLONIES/mL ESCHERICHIA COLI Confirmed Extended Spectrum Beta-Lactamase Producer (ESBL).  In bloodstream infections from ESBL organisms, carbapenems are preferred over piperacillin/tazobactam. They are shown to have a lower risk of mortality. 60,000 COLONIES/mL LACTOBACILLUS SPECIES Standardized susceptibility testing for this organism is not available. Performed at [facility], [place] [address] [place], [place] [address]    Report Status 05/07/2021 FINAL  Final   Organism ID, Bacteria ESCHERICHIA COLI (A)  Final      Susceptibility   Escherichia coli - MIC*    AMPICILLIN >=32 RESISTANT Resistant     CEFAZOLIN >=64 RESISTANT Resistant     CEFEPIME <=0.12 SENSITIVE Sensitive     CEFTRIAXONE 0.5 SENSITIVE Sensitive     CIPROFLOXACIN <=0.25 SENSITIVE Sensitive     GENTAMICIN <=1 SENSITIVE Sensitive     IMIPENEM <=0.25 SENSITIVE Sensitive     NITROFURANTOIN <=  16 SENSITIVE Sensitive     TRIMETH/SULFA <=20 SENSITIVE Sensitive     AMPICILLIN/SULBACTAM >=32 RESISTANT Resistant     PIP/TAZO 8 SENSITIVE Sensitive     * 20,000 COLONIES/mL ESCHERICHIA COLI  Resp Panel by RT-PCR (Flu A&B, Covid) Nasopharyngeal Swab     Status: None   Collection Time: 05/04/21  6:21 PM   Specimen: Nasopharyngeal Swab; Nasopharyngeal(NP) swabs in vial transport medium  Result Value Ref Range Status   SARS Coronavirus 2 by RT PCR NEGATIVE NEGATIVE Final    Comment: (NOTE) SARS-CoV-2 target nucleic acids are NOT DETECTED.  The SARS-CoV-2 RNA is generally detectable in upper respiratory specimens during the acute phase of infection. The lowest concentration of SARS-CoV-2 viral copies this assay can detect is 138 copies/mL. A negative result does not preclude SARS-Cov-2 infection and should not be used as the sole basis for treatment or other patient management  decisions. A negative result may occur with  improper specimen collection/handling, submission of specimen other than nasopharyngeal swab, presence of viral mutation(s) within the areas targeted by this assay, and inadequate number of viral copies(<138 copies/mL). A negative result must be combined with clinical observations, patient history, and epidemiological information. The expected result is Negative.  Fact Sheet for Patients:  EntrepreneurPulse.com.au  Fact Sheet for Healthcare Providers:  IncredibleEmployment.be  This test is no t yet approved or cleared by the [place] FDA and  has been authorized for detection and/or diagnosis of SARS-CoV-2 by FDA under an Emergency Use Authorization (EUA). This EUA will remain  in effect (meaning this test can be used) for the duration of the COVID-19 declaration under Section 564(b)(1) of the Act, 21 U.S.C.section 360bbb-3(b)(1), unless the authorization is terminated  or revoked sooner.       Influenza A by PCR NEGATIVE NEGATIVE Final   Influenza B by PCR NEGATIVE NEGATIVE Final    Comment: (NOTE) The Xpert Xpress SARS-CoV-2/FLU/RSV plus assay is intended as an aid in the diagnosis of influenza from Nasopharyngeal swab specimens and should not be used as a sole basis for treatment. Nasal washings and aspirates are unacceptable for Xpert Xpress SARS-CoV-2/FLU/RSV testing.  Fact Sheet for Patients: EntrepreneurPulse.com.au  Fact Sheet for Healthcare Providers: IncredibleEmployment.be  This test is not yet approved or cleared by the [place] FDA and has been authorized for detection and/or diagnosis of SARS-CoV-2 by FDA under an Emergency Use Authorization (EUA). This EUA will remain in effect (meaning this test can be used) for the duration of the COVID-19 declaration under Section 564(b)(1) of the Act, 21 U.S.C. section 360bbb-3(b)(1), unless the  authorization is terminated or revoked.  Performed at [facility], [address] [place], [place] [address]   Blood Culture (routine x 2)     Status: None (Preliminary result)   Collection Time: 05/04/21  6:21 PM   Specimen: BLOOD  Result Value Ref Range Status   Specimen Description BLOOD RIGHT [facility]  Final   Special Requests   Final    BOTTLES DRAWN AEROBIC AND ANAEROBIC Blood Culture adequate volume   Culture   Final    NO GROWTH 4 DAYS Performed at [facility], [address] [place], [place] [address]    Report Status PENDING  Incomplete  Blood Culture (routine x 2)     Status: None (Preliminary result)   Collection Time: 05/04/21  6:26 PM   Specimen: BLOOD  Result Value Ref Range Status   Specimen Description BLOOD LEFT HAND  Final   Special Requests  Final    BOTTLES DRAWN AEROBIC AND ANAEROBIC Blood Culture adequate volume   Culture   Final    NO GROWTH 4 DAYS Performed at [facility], [address] [place], [place] [address]    Report Status PENDING  Incomplete  MRSA Next Gen by PCR, Nasal     Status: None   Collection Time: 05/05/21  8:50 AM   Specimen: Nasal Mucosa; Nasal Swab  Result Value Ref Range Status   MRSA by PCR Next Gen NOT DETECTED NOT DETECTED Final    Comment: (NOTE) The GeneXpert MRSA Assay (FDA approved for NASAL specimens only), is one component of a comprehensive MRSA colonization surveillance program. It is not intended to diagnose MRSA infection nor to guide or monitor treatment for MRSA infections. Test performance is not FDA approved in patients less than 75 years old. Performed at [facility], [address] [place], [place] [address]          Radiology Studies: No results found.      Scheduled Meds:  apixaban  5 mg Oral BID   Chlorhexidine Gluconate Cloth  6 each Topical Daily   citalopram  20 mg Oral Daily   digoxin  125 mcg Oral Daily   feeding supplement  237 mL Oral BID  BM   furosemide  40 mg Oral BID   guaiFENesin  1,200 mg Oral BID   levothyroxine  88 mcg Oral Daily   megestrol  40 mg Oral BID   metoprolol tartrate  25 mg Oral BID   multivitamin with minerals  1 tablet Oral Daily   potassium chloride  10 mEq Oral BID   sulfamethoxazole-trimethoprim  1 tablet Oral Q12H   tiotropium  18 mcg Inhalation Daily   vitamin B-12  100 mcg Oral Daily   Continuous Infusions:   LOS: 4 days    Time spent: 35 minutes    Sidney Ace, MD [facility]   If 7PM-7AM, please contact night-coverage  05/08/2021, 2:01 PM

## 2021-05-08 NOTE — Evaluation (Signed)
Physical Therapy Evaluation Patient Details Name: [name] MRN: [id number] DOB: [birth year] Today's Date: 05/08/2021  History of Present Illness  Patient is an 80 year old female who presents from [facility]) with altered mental status preceded by 3 to 4 days of increasing weakness. Per EMS, patient was hypoxic on scene on her home dose of 3LNC. Patient was admitted to [facility] for severe spesis secondary to UTI complicated by acute metabolic encephalopathy and acute hypoxemic respiratory failure. PMH of chronic sppsis, COPD, A-Fib, anti-coagulan long term use, and Hyperbilirubinemia.   Clinical Impression  Patient tolerated session well and was agreeable to treatment. Upon entering room patient was supine in bed with HOB raised. Patient reported wanting to rest but was agreeable to therapy. Patient is Mod I with bed mobility (supine to sit) Requires use of hand rails to pull up to sitting. BLE strength globally 3-/5. No pain reported throughout session. O2 ranged from 89-91 on 2L [place] throughout session. Patient was able to ambulate a total of 10 feet CGA with RW. Terminated going further due to patient's reports of dizziness. Dizziness decreased following sitting down in chair. Recommend HHPT upon return to [facility] Patient would continue to benefit from skilled physical therapy in order to work on gait abnormalities, balance, and LE strength.      Recommendations for follow up therapy are one component of a multi-disciplinary discharge planning process, led by the attending physician.  Recommendations may be updated based on patient status, additional functional criteria and insurance authorization.  Follow Up Recommendations Home health PT (Home health PT upon returning to SNF [facility]))    Assistance Recommended at Discharge Intermittent Supervision/Assistance  Functional Status Assessment Patient has had a recent decline in their functional status and  demonstrates the ability to make significant improvements in function in a reasonable and predictable amount of time.  Equipment Recommendations  Rolling walker (2 wheels)    Recommendations for Other Services       Precautions / Restrictions Precautions Precautions: None Restrictions Weight Bearing Restrictions: No      Mobility  Bed Mobility Overal bed mobility: Modified Independent (increased time and effort; required use of L hand rail)                  Transfers Overall transfer level: Needs assistance Equipment used: Rolling walker (2 wheels) Transfers: Sit to/from Stand Sit to Stand: Min assist           General transfer comment: patient required x3 attempts before completing sit to stand    Ambulation/Gait Ambulation/Gait assistance: Min guard Gait Distance (Feet): 10 Feet (ambulated forward 5 feet before reporting feeling dizzy, was able to ambulate backwards to chair) Assistive device: Rolling walker (2 wheels) Gait Pattern/deviations: Shuffle;Decreased stride length;Step-to pattern;Narrow base of support Gait velocity: decreased cadence        Stairs            Wheelchair Mobility    Modified Rankin (Stroke Patients Only)       Balance Overall balance assessment: Needs assistance Sitting-balance support: Bilateral upper extremity supported;Feet supported Sitting balance-Leahy Scale: Good Sitting balance - Comments: able to reach outside BOS   Standing balance support: Bilateral upper extremity supported Standing balance-Leahy Scale: Fair                               Pertinent Vitals/Pain Pain Assessment: 0-10 Pain Score: 0-No pain Pain Intervention(s): Monitored during  session    Home Living Family/patient expects to be discharged to:: Skilled nursing facility [facility])                        Prior Function Prior Level of Function : Independent/Modified Independent                      Hand Dominance   Dominant Hand: Right    Extremity/Trunk Assessment   Upper Extremity Assessment Upper Extremity Assessment: LUE deficits/detail;Defer to OT evaluation LUE Deficits / Details: limited shoulder flexion due to "no cartilage in this shoulder" per patient report. Patient was able to initate about 20 degrees    Lower Extremity Assessment Lower Extremity Assessment: Generalized weakness (ROM bilaterally WFL, strenght globally 3-/5)       Communication   Communication: No difficulties  Cognition Arousal/Alertness: Awake/alert Behavior During Therapy: WFL for tasks assessed/performed Overall Cognitive Status: Within Functional Limits for tasks assessed                                          General Comments      Exercises Other Exercises Other Exercises: verbal cueing on hand placement to/from sit to stand, cueing to push up from bed and to not pull from RW Other Exercises: verbal cueing to feel chair on back of knees prior to sitting Other Exercises: x2 sit to stands from EOB   Assessment/Plan    PT Assessment Patient needs continued PT services  PT Problem List Decreased strength;Decreased activity tolerance;Decreased balance       PT Treatment Interventions Gait training;Functional mobility training;Therapeutic activities;Therapeutic exercise;Balance training;DME instruction;Neuromuscular re-education;Patient/family education    PT Goals (Current goals can be found in the Care Plan section)  Acute Rehab PT Goals Patient Stated Goal: wants to go home PT Goal Formulation: With patient Time For Goal Achievement: 05/22/21 Potential to Achieve Goals: Good    Frequency     Barriers to discharge        Co-evaluation               AM-PAC PT "6 Clicks" Mobility  Outcome Measure Help needed turning from your back to your side while in a flat bed without using bedrails?: A Little Help needed moving from lying on your back to  sitting on the side of a flat bed without using bedrails?: A Little Help needed moving to and from a bed to a chair (including a wheelchair)?: A Little Help needed standing up from a chair using your arms (e.g., wheelchair or bedside chair)?: A Little Help needed to walk in hospital room?: A Little Help needed climbing 3-5 steps with a railing? : A Lot 6 Click Score: 17    End of Session Equipment Utilized During Treatment: Gait belt Activity Tolerance: Patient tolerated treatment well;No increased pain Patient left: in chair;with call bell/phone within reach;with chair alarm set Nurse Communication: Mobility status PT Visit Diagnosis: Unsteadiness on feet (R26.81);Other abnormalities of gait and mobility (R26.89);Muscle weakness (generalized) (M62.81);Difficulty in walking, not elsewhere classified (R26.2)    Time: 0973-5329 PT Time Calculation (min) (ACUTE ONLY): 37 min   Charges:   PT Evaluation $PT Eval Low Complexity: 1 Low PT Treatments $Therapeutic Activity: 8-22 mins        Iva Boop, PT  05/08/21. 10:54 AM

## 2021-05-09 LAB — URINALYSIS, COMPLETE (UACMP) WITH MICROSCOPIC
Bilirubin Urine: NEGATIVE
Glucose, UA: NEGATIVE mg/dL
Ketones, ur: NEGATIVE mg/dL
Leukocytes,Ua: NEGATIVE
Nitrite: NEGATIVE
Protein, ur: 30 mg/dL — AB
Specific Gravity, Urine: 1.025 (ref 1.005–1.030)
pH: 5 (ref 5.0–8.0)

## 2021-05-09 LAB — CULTURE, BLOOD (ROUTINE X 2)
Culture: NO GROWTH
Culture: NO GROWTH
Special Requests: ADEQUATE
Special Requests: ADEQUATE

## 2021-05-09 MED ORDER — ACETAMINOPHEN 500 MG PO TABS: 1000.0000 mg | ORAL_TABLET | Freq: Four times a day (QID) | ORAL | 0 refills | Status: AC | PRN

## 2021-05-09 MED ORDER — PANTOPRAZOLE SODIUM 40 MG PO TBEC: 40.0000 mg | DELAYED_RELEASE_TABLET | Freq: Every day | ORAL | 0 refills | Status: AC

## 2021-05-09 MED ORDER — SULFAMETHOXAZOLE-TRIMETHOPRIM 800-160 MG PO TABS: 1.0000 | ORAL_TABLET | Freq: Two times a day (BID) | ORAL | 0 refills | Status: AC

## 2021-05-09 NOTE — Care Management Important Message (Signed)
Important Message  Patient Details  Name: [name] MRN: [id number] Date of Birth: 23-Dec-1940   Medicare Important Message Given:  Yes  Patient is in an isolation room so I reviewed her Important Message from Medicare with her by phone [phone]). She is in agreement with her discharge plan but asked me to talk with her daughter, Mc Hollen ([phone]) so I called her and she was in agreement with the discharge. She asked if she would be transported back to ALF by EMS as that is how she was transported last time. I talked with the RNCM as the patient thought her daughter would be transporting. She will call the patient and daughter and arrange for EMS to transport. I thanked them for their time.  [name] A Choya Tornow 05/09/2021, 3:20 PM

## 2021-05-09 NOTE — Care Management Important Message (Signed)
Important Message  Patient Details  Name: [name] MRN: [id number] Date of Birth: 04/28/1941   Medicare Important Message Given:  Other (see comment)  Patient is in an isolation room so I called her room [phone]) and she said the nurse was giving medications so she asked me to call back later as her daughter would be here after her appointment. I thanked for her time and let her know I would call back.   [name] [name] 05/09/2021, 10:25 AM

## 2021-05-09 NOTE — NC FL2 (Signed)
[place] LEVEL OF CARE SCREENING TOOL     IDENTIFICATION  Patient Name: [name] Birthdate: 20-Feb-1941 Sex: female Admission Date (Current Location): 05/04/2021  [facility] and [place] Number:  Engineering geologist and Address:  [facility], [address] [place], [place] [address]      Provider Number: [id number]  Attending Physician Name and Address:  Sidney Ace, MD  Relative Name and Phone Number:  Anne Ng Daughter [phone]    Current Level of Care: Hospital Recommended Level of Care: [place] Prior Approval Number:    Date Approved/Denied:   PASRR Number:    Discharge Plan: Other (Comment) ([place])    Current Diagnoses: Patient Active Problem List   Diagnosis Date Noted   Acute on chronic respiratory failure with hypoxia ([place]) 09/32/3557   Acute metabolic encephalopathy 32/20/2542   UTI (urinary tract infection)    Severe sepsis (HCC)    Anticoagulant long-term use    Physical deconditioning 06/16/2019   Hypokalemia    Chronic combined systolic and diastolic CHF (congestive heart failure) ([place]) 09/15/2016   Hyperlipidemia    Chronic respiratory failure with hypoxia ([place]) 12/03/2014   Insomnia, chronic 01/27/2014   Atrial fibrillation with RVR ([place])    Multiple thyroid nodules 10/17/2012   AAA (abdominal aortic aneurysm) 05/11/2012   Bradycardia 01/23/2012   Hypothyroid 01/21/2012   HTN (hypertension) 01/21/2012   Edema 11/19/2011   Atrial flutter ([place]) 11/20/2010   Obstructive sleep apnea 10/16/2008   DVT 08/19/2008   COPD (chronic obstructive pulmonary disease) ([place]) 08/19/2008   NEOPLASM, MALIGNANT, LUNG, HX OF 70/62/3762   Diastolic heart failure, NYHA class 2 ([place]) 08/03/2008   Small cell carcinoma of lung ([place]) 06/23/2002   Hyperbilirubinemia 2004    Orientation RESPIRATION BLADDER Height & Weight     Self, Time, Situation, Place  O2 (2 liters)  Continent Weight: 93.3 kg Height:  5\' 6"  (167.6 cm)  BEHAVIORAL SYMPTOMS/MOOD NEUROLOGICAL BOWEL NUTRITION STATUS      Continent Diet (regular)  AMBULATORY STATUS COMMUNICATION OF NEEDS Skin   Limited Assist Verbally Normal                       Personal Care Assistance Level of Assistance  Bathing, Feeding, Dressing Bathing Assistance: Limited assistance Feeding assistance: Independent Dressing Assistance: Limited assistance Total Care Assistance: Independent   Functional Limitations Info             SPECIAL CARE FACTORS FREQUENCY  PT (By licensed PT)     PT Frequency: 2 - 3 times per week              Contractures      Additional Factors Info  Code Status, Allergies Code Status Info: full code Allergies Info: NKDA           Current Medications (05/09/2021):  This is the current hospital active medication list Current Facility-Administered Medications  Medication Dose Route Frequency Provider Last Rate Last Admin   acetaminophen (TYLENOL) tablet 1,000 mg  1,000 mg Oral Q6H PRN Jennye Boroughs, MD   1,000 mg at 05/09/21 1027   albuterol (PROVENTIL) (2.5 MG/3ML) 0.083% nebulizer solution 2.5 mg  2.5 mg Inhalation Q6H PRN Jennye Boroughs, MD       apixaban Arne Cleveland) tablet 5 mg  5 mg Oral BID Jennye Boroughs, MD   5 mg at 05/09/21 1021   Chlorhexidine Gluconate Cloth 2 % PADS 6 each  6 each Topical  Daily Jennye Boroughs, MD   6 each at 05/09/21 1022   citalopram (CELEXA) tablet 20 mg  20 mg Oral Daily Jennye Boroughs, MD   20 mg at 05/09/21 1021   digoxin (LANOXIN) tablet 125 mcg  125 mcg Oral Daily Jennye Boroughs, MD   125 mcg at 05/09/21 1022   feeding supplement (ENSURE ENLIVE / ENSURE PLUS) liquid 237 mL  237 mL Oral BID BM Jennye Boroughs, MD   237 mL at 05/09/21 1447   furosemide (LASIX) tablet 40 mg  40 mg Oral BID Jennye Boroughs, MD   40 mg at 05/09/21 0837   guaiFENesin (MUCINEX) 12 hr tablet 1,200 mg  1,200 mg Oral BID Jennye Boroughs, MD   1,200 mg at  05/09/21 1021   levothyroxine (SYNTHROID) tablet 88 mcg  88 mcg Oral Daily Jennye Boroughs, MD   88 mcg at 05/09/21 6979   megestrol (MEGACE) tablet 40 mg  40 mg Oral BID Jennye Boroughs, MD   40 mg at 05/09/21 1020   metoprolol tartrate (LOPRESSOR) tablet 25 mg  25 mg Oral BID Jennye Boroughs, MD   25 mg at 05/09/21 1022   multivitamin with minerals tablet 1 tablet  1 tablet Oral Daily Jennye Boroughs, MD   1 tablet at 05/09/21 1021   ondansetron (ZOFRAN) tablet 4 mg  4 mg Oral Q6H PRN Jennye Boroughs, MD       Or   ondansetron [facility]) injection 4 mg  4 mg Intravenous Q6H PRN Jennye Boroughs, MD       oxyCODONE (Oxy IR/ROXICODONE) immediate release tablet 5 mg  5 mg Oral Q6H PRN Jennye Boroughs, MD   5 mg at 05/09/21 0551   pantoprazole (PROTONIX) EC tablet 40 mg  40 mg Oral Daily Ralene Muskrat B, MD   40 mg at 05/09/21 1022   potassium chloride (KLOR-CON) CR tablet 10 mEq  10 mEq Oral BID Jennye Boroughs, MD   10 mEq at 05/09/21 1022   sulfamethoxazole-trimethoprim (BACTRIM DS) 800-160 MG per tablet 1 tablet  1 tablet Oral Q12H Jennye Boroughs, MD   1 tablet at 05/09/21 1021   temazepam (RESTORIL) capsule 15 mg  15 mg Oral QHS PRN Jennye Boroughs, MD   15 mg at 05/07/21 2355   tiotropium (SPIRIVA) inhalation capsule ([facility] use ONLY) 18 mcg  18 mcg Inhalation Daily Jennye Boroughs, MD   18 mcg at 05/09/21 1024   vitamin B-12 (CYANOCOBALAMIN) tablet 100 mcg  100 mcg Oral Daily Jennye Boroughs, MD   100 mcg at 05/09/21 1023     Discharge Medications: Please see discharge summary for a list of discharge medications.  Relevant Imaging Results:  Relevant Lab Results:   Additional Information SS# [id number]  Conception Oms, RN

## 2021-05-09 NOTE — TOC Progression Note (Signed)
Transition of Care [facility]) - Progression Note    Patient Details  Name: [name] MRN: [id number] Date of Birth: 11/27/1940  Transition of Care [facility]) CM/SW [place], RN Phone Number: 05/09/2021, 3:30 PM  Clinical Narrative:    Spoke to Daughter Anne Ng on the phone She would like EMS to transport the patient back to [facility] room 40 today, Called EMS to transport,         Expected Discharge Plan and Services           Expected Discharge Date: 05/09/21                                     Social Determinants of Health (SDOH) Interventions    Readmission Risk Interventions No flowsheet data found.

## 2021-05-09 NOTE — Plan of Care (Signed)
Patient discharged per MD orders at this time.All discharge instructions,education and medications reviewed with patient at the bedside.Pt expressed understanding and will comply with dc instructions.follow up appointments was also communicated to patient.no verbal c/o or any ssx of distress.Pt was discharged to [facility] per order.Daughter was updated with AVS before transport.patient was transported by two EMS personnel on a stretcher.

## 2021-05-09 NOTE — Discharge Summary (Signed)
Physician Discharge Summary  [name] NFA:[id number] DOB: 10/27/1940 DOA: 05/04/2021  PCP: Burnard Bunting, MD  Admit date: 05/04/2021 Discharge date: 05/09/2021  Admitted From: Assisted living facility Disposition: [facility] assisted living  Recommendations for Outpatient Follow-up:  Follow up with PCP in 1-2 weeks Ambulatory referral to gastroenterology   Home Health: Yes, PT OT RN aide Equipment/Devices:None  Discharge Condition:Stable CODE STATUS:FULL  Diet recommendation: Regular  Brief/Interim Summary:  80 y.o. female with medical history significant for chronic combined systolic and diastolic CHF (EF 35 to 46% in September 2022) COPD, atrial fibrillation on Eliquis, chronic hypoxemic respiratory failure on 3 to 4 L/min oxygen, anxiety, hypertension, hypothyroidism, small cell lung cancer, OSA, PVD.  She presented to the hospital for assisted-living facility because of altered mental status and generalized weakness.  When EMS picked her up, she was hypoxemic and was in rapid atrial fibrillation.   She was admitted to the hospital for severe sepsis secondary to UTI complicated by acute metabolic encephalopathy and acute hypoxemic respiratory failure.     Improved and back to baseline as of 1117.  Stable for discharge back to assisted living.  Home health services ordered.  Ambulatory referral to GI placed at time of discharge.  Seen by speech therapy while hospitalized.  Presentation consistent with esophageal dysmotility and presbyesophagus.   Discharge Diagnoses:  Principal Problem:   Severe sepsis ([place]) Active Problems:   COPD (chronic obstructive pulmonary disease) (HCC)   Atrial fibrillation with RVR (HCC)   Chronic combined systolic and diastolic CHF (congestive heart failure) (HCC)   Small cell carcinoma of lung (HCC)   Acute on chronic respiratory failure with hypoxia (HCC)   Acute metabolic encephalopathy   UTI (urinary tract infection)   Anticoagulant  long-term use   Hyperbilirubinemia  Severe sepsis secondary to UTI Urine culture with ESBL E. Coli Was on Rocephin, switched to Bactrim Plan: Discharge back to assisted living.  Complete course of Bactrim.  Repeat urinalysis at time of discharge shows clearance of bacteria.   Atrial fibrillation rapid ventricular response Heart rate improved Continue metoprolol and Eliquis   Acute metabolic encephalopathy Presumably secondary to sepsis Mental status improving  at baseline at time of discharge   Acute on chronic hypoxemic respiratory failure Patient back on baseline 3 to 4 L oxygen via nasal cannula   COPD Not exacerbated Continue as needed bronchodilators   Chronic combined systolic and diastolic congestive heart failure Not acutely exacerbated Continue Lasix 40 p.o. twice daily   Functional decline Therapy evaluations   History of small cell lung cancer Outpatient oncology follow-up  Esophageal dysmotility Presbyesophagus Seen in consultation by speech therapy during hospitalization.  Patient not on any antireflux therapy.  Recommend Protonix 40 mg p.o. daily at time of discharge.  Ambulatory referral to GI.  Discharge Instructions  Discharge Instructions     Ambulatory referral to Gastroenterology   Complete by: As directed    Presbyesophagus; esophageal dysmotility   Diet - low sodium heart healthy   Complete by: As directed    Increase activity slowly   Complete by: As directed       Allergies as of 05/09/2021   No Known Allergies      Medication List     STOP taking these medications    acetaminophen 650 MG CR tablet Commonly known as: TYLENOL Replaced by: acetaminophen 500 MG tablet   CALCIUM-MAGNESIUM-ZINC PO   cefdinir 300 MG capsule Commonly known as: OMNICEF   tiZANidine 2 MG tablet Commonly known as:  ZANAFLEX       TAKE these medications    acetaminophen 500 MG tablet Commonly known as: TYLENOL Take 2 tablets (1,000 mg  total) by mouth every 6 (six) hours as needed for fever, headache, mild pain or moderate pain (backpain). Replaces: acetaminophen 650 MG CR tablet   albuterol 108 (90 Base) MCG/ACT inhaler Commonly known as: Ventolin HFA TAKE 2 PUFFS EVERY 6 HOURS AS NEEDED FORSHORTNESS OF BREATH/WHEEZING   apixaban 5 MG Tabs tablet Commonly known as: ELIQUIS Take 1 tablet (5 mg total) by mouth 2 (two) times daily.   citalopram 20 MG tablet Commonly known as: CELEXA Take 20 mg by mouth daily.   digoxin 0.125 MG tablet Commonly known as: LANOXIN Take 1 tablet (125 mcg total) by mouth daily.   furosemide 40 MG tablet Commonly known as: LASIX Take 2 tablets ( 80 mg ) daily   gabapentin 100 MG capsule Commonly known as: NEURONTIN Take 1 capsule by mouth 3 (three) times daily.   guaiFENesin 600 MG 12 hr tablet Commonly known as: MUCINEX Take 1,200 mg by mouth 2 (two) times daily.   levothyroxine 88 MCG tablet Commonly known as: SYNTHROID Take 88 mcg by mouth daily.   megestrol 40 MG tablet Commonly known as: MEGACE Take 40 mg by mouth 2 (two) times daily.   metolazone 2.5 MG tablet Commonly known as: ZAROXOLYN Take 1 tablet (2.5 mg total) by mouth daily as needed (ONE DAILY PRN WEIGHT GAIN). USE AS NEEDED FOR WEIGHT GAIN   metoprolol tartrate 25 MG tablet Commonly known as: LOPRESSOR TAKE 1/2 TABLET TWICE DAILY What changed:  how much to take how to take this when to take this additional instructions   morphine 15 MG tablet Commonly known as: MSIR Take 5 mg by mouth as needed.   mupirocin ointment 2 % Commonly known as: BACTROBAN mupirocin 2 % topical ointment  APPLY TO WOUNDS ON SKIN ONCE DAILY AT BANDAGE CHANGES   OXYGEN Inhale 4 L into the lungs daily. Patient states she wears 2L at night and 4L when up and moving   pantoprazole 40 MG tablet Commonly known as: PROTONIX Take 1 tablet (40 mg total) by mouth daily. Start taking on: May 10, 2021   potassium chloride  10 MEQ tablet Commonly known as: KLOR-CON Take 1 tablet (10 mEq total) by mouth 2 (two) times daily.   Spiriva HandiHaler 18 MCG inhalation capsule Generic drug: tiotropium Place 1 capsule (18 mcg total) into inhaler and inhale daily.   sulfamethoxazole-trimethoprim 800-160 MG tablet Commonly known as: BACTRIM DS Take 1 tablet by mouth every 12 (twelve) hours for 4 days.   temazepam 15 MG capsule Commonly known as: RESTORIL TAKE 1 CAPSULE AT BEDTIME AS NEEDED  FOR  SLEEP   vitamin B-12 100 MCG tablet Commonly known as: CYANOCOBALAMIN Take 100 mcg by mouth daily.   Vitamin D 1000 units capsule Take 1,000 Units by mouth daily.        No Known Allergies  Consultations: None   Procedures/Studies: DG Chest Port 1 View  Result Date: 05/05/2021 CLINICAL DATA:  Dyspnea EXAM: PORTABLE CHEST 1 VIEW COMPARISON:  05/04/2021 FINDINGS: Since the prior examination, there has been little change. Lung volumes are small, however, pulmonary insufflation is stable. Interstitial thickening persists, likely related to underlying interstitial disease compounded by pulmonary hypoinflation. Mild left basilar atelectasis is present. No pneumothorax or pleural effusion. Cardiac size is mildly enlarged, unchanged. Pulmonary vascularity is normal. No acute bone abnormality. IMPRESSION: Stable pulmonary hypoinflation.  Left basilar atelectasis. Stable cardiomegaly. Electronically Signed   By: Fidela Salisbury M.D.   On: 05/05/2021 02:22   DG Chest Port 1 View  Result Date: 05/04/2021 CLINICAL DATA:  Sepsis EXAM: PORTABLE CHEST 1 VIEW COMPARISON:  03/07/2021 FINDINGS: Lung volumes are small and pulmonary insufflation has diminished since prior examination. There is diffuse interstitial thickening which may relate to pulmonary hypoinflation. Superimposed mild left basilar atelectasis or infiltrate. No pneumothorax or pleural effusion. Cardiomegaly is stable when accounting for poor pulmonary insufflation.  Superior mediastinal widening, again, likely relates to poor pulmonary insufflation. No acute bone abnormality. IMPRESSION: Pulmonary hypoinflation. Mild left basilar atelectasis or infiltrate. Stable cardiomegaly. Electronically Signed   By: Fidela Salisbury M.D.   On: 05/04/2021 19:06      Subjective: Seen and examined the day of discharge.  Mental status at baseline.  No dysuria.  Vital stable.  Stable for discharge to assisted living.  Discharge Exam: Vitals:   05/09/21 0502 05/09/21 0733  BP: 106/72 99/66  Pulse: 75 77  Resp: 20 15  Temp: 98.6 F (37 C) 97.9 F (36.6 C)  SpO2: 93% 92%   Vitals:   05/08/21 1223 05/08/21 1953 05/09/21 0502 05/09/21 0733  BP: (!) 141/100 107/74 106/72 99/66  Pulse: 86 95 75 77  Resp: 19 17 20 15   Temp: 97.9 F (36.6 C) 97.9 F (36.6 C) 98.6 F (37 C) 97.9 F (36.6 C)  TempSrc:  Oral    SpO2: 93% 93% 93% 92%  Weight:      Height:        General: Pt is alert, awake, not in acute distress Cardiovascular: RRR, S1/S2 +, no rubs, no gallops Respiratory: CTA bilaterally, no wheezing, no rhonchi Abdominal: Soft, NT, ND, bowel sounds + Extremities: no edema, no cyanosis    The results of significant diagnostics from this hospitalization (including imaging, microbiology, ancillary and laboratory) are listed below for reference.     Microbiology: Recent Results (from the past 240 hour(s))  Urine Culture     Status: Abnormal   Collection Time: 05/04/21  6:15 PM   Specimen: In/Out Cath Urine  Result Value Ref Range Status   Specimen Description   Final    IN/OUT CATH URINE Performed at [facility], [address] [place], [place] [address]    Special Requests   Final    NONE Performed at [facility], [address] [place], [place] [address]    Culture (A)  Final    20,000 COLONIES/mL ESCHERICHIA COLI Confirmed Extended Spectrum Beta-Lactamase Producer (ESBL).  In bloodstream infections from ESBL organisms,  carbapenems are preferred over piperacillin/tazobactam. They are shown to have a lower risk of mortality. 60,000 COLONIES/mL LACTOBACILLUS SPECIES Standardized susceptibility testing for this organism is not available. Performed at [facility], [place] [address] [place], [place] [address]    Report Status 05/07/2021 FINAL  Final   Organism ID, Bacteria ESCHERICHIA COLI (A)  Final      Susceptibility   Escherichia coli - MIC*    AMPICILLIN >=32 RESISTANT Resistant     CEFAZOLIN >=64 RESISTANT Resistant     CEFEPIME <=0.12 SENSITIVE Sensitive     CEFTRIAXONE 0.5 SENSITIVE Sensitive     CIPROFLOXACIN <=0.25 SENSITIVE Sensitive     GENTAMICIN <=1 SENSITIVE Sensitive     IMIPENEM <=0.25 SENSITIVE Sensitive     NITROFURANTOIN <=16 SENSITIVE Sensitive     TRIMETH/SULFA <=20 SENSITIVE Sensitive     AMPICILLIN/SULBACTAM >=32 RESISTANT Resistant  PIP/TAZO 8 SENSITIVE Sensitive     * 20,000 COLONIES/mL ESCHERICHIA COLI  Resp Panel by RT-PCR (Flu A&B, Covid) Nasopharyngeal Swab     Status: None   Collection Time: 05/04/21  6:21 PM   Specimen: Nasopharyngeal Swab; Nasopharyngeal(NP) swabs in vial transport medium  Result Value Ref Range Status   SARS Coronavirus 2 by RT PCR NEGATIVE NEGATIVE Final    Comment: (NOTE) SARS-CoV-2 target nucleic acids are NOT DETECTED.  The SARS-CoV-2 RNA is generally detectable in upper respiratory specimens during the acute phase of infection. The lowest concentration of SARS-CoV-2 viral copies this assay can detect is 138 copies/mL. A negative result does not preclude SARS-Cov-2 infection and should not be used as the sole basis for treatment or other patient management decisions. A negative result may occur with  improper specimen collection/handling, submission of specimen other than nasopharyngeal swab, presence of viral mutation(s) within the areas targeted by this assay, and inadequate number of viral copies(<138 copies/mL). A negative result  must be combined with clinical observations, patient history, and epidemiological information. The expected result is Negative.  Fact Sheet for Patients:  EntrepreneurPulse.com.au  Fact Sheet for Healthcare Providers:  IncredibleEmployment.be  This test is no t yet approved or cleared by the [place] FDA and  has been authorized for detection and/or diagnosis of SARS-CoV-2 by FDA under an Emergency Use Authorization (EUA). This EUA will remain  in effect (meaning this test can be used) for the duration of the COVID-19 declaration under Section 564(b)(1) of the Act, 21 U.S.C.section 360bbb-3(b)(1), unless the authorization is terminated  or revoked sooner.       Influenza A by PCR NEGATIVE NEGATIVE Final   Influenza B by PCR NEGATIVE NEGATIVE Final    Comment: (NOTE) The Xpert Xpress SARS-CoV-2/FLU/RSV plus assay is intended as an aid in the diagnosis of influenza from Nasopharyngeal swab specimens and should not be used as a sole basis for treatment. Nasal washings and aspirates are unacceptable for Xpert Xpress SARS-CoV-2/FLU/RSV testing.  Fact Sheet for Patients: EntrepreneurPulse.com.au  Fact Sheet for Healthcare Providers: IncredibleEmployment.be  This test is not yet approved or cleared by the [place] FDA and has been authorized for detection and/or diagnosis of SARS-CoV-2 by FDA under an Emergency Use Authorization (EUA). This EUA will remain in effect (meaning this test can be used) for the duration of the COVID-19 declaration under Section 564(b)(1) of the Act, 21 U.S.C. section 360bbb-3(b)(1), unless the authorization is terminated or revoked.  Performed at [facility], [address] [place], [place] [address]   Blood Culture (routine x 2)     Status: None   Collection Time: 05/04/21  6:21 PM   Specimen: BLOOD  Result Value Ref Range Status   Specimen Description  BLOOD RIGHT [facility]  Final   Special Requests   Final    BOTTLES DRAWN AEROBIC AND ANAEROBIC Blood Culture adequate volume   Culture   Final    NO GROWTH 5 DAYS Performed at [facility], [address] [place], [place] [address]    Report Status 05/09/2021 FINAL  Final  Blood Culture (routine x 2)     Status: None   Collection Time: 05/04/21  6:26 PM   Specimen: BLOOD  Result Value Ref Range Status   Specimen Description BLOOD LEFT HAND  Final   Special Requests   Final    BOTTLES DRAWN AEROBIC AND ANAEROBIC Blood Culture adequate volume   Culture   Final    NO GROWTH  5 DAYS Performed at [facility], [address] [place], [place] [address]    Report Status 05/09/2021 FINAL  Final  MRSA Next Gen by PCR, Nasal     Status: None   Collection Time: 05/05/21  8:50 AM   Specimen: Nasal Mucosa; Nasal Swab  Result Value Ref Range Status   MRSA by PCR Next Gen NOT DETECTED NOT DETECTED Final    Comment: (NOTE) The GeneXpert MRSA Assay (FDA approved for NASAL specimens only), is one component of a comprehensive MRSA colonization surveillance program. It is not intended to diagnose MRSA infection nor to guide or monitor treatment for MRSA infections. Test performance is not FDA approved in patients less than 16 years old. Performed at [facility], [address] [place], [place] [address]      Labs: BNP (last 3 results) Recent Labs    11/18/20 1914 03/07/21 1223 05/05/21 0246  BNP 455.2* 386.7* 250.0*   Basic Metabolic Panel: Recent Labs  Lab 05/04/21 1816 05/05/21 0647  NA 135 137  K 4.2 3.8  CL 99 101  CO2 27 28  GLUCOSE 118* 142*  BUN 24* 20  CREATININE 0.97 0.76  CALCIUM 9.4 8.9  MG  --  2.1   Liver Function Tests: Recent Labs  Lab 05/04/21 1816 05/05/21 0647  AST 48* 39  ALT 19 16  ALKPHOS 44 37*  BILITOT 2.3* 1.4*  PROT 8.1 6.7  ALBUMIN 3.9 3.0*   No results for input(s): LIPASE, AMYLASE in the last 168 hours. No  results for input(s): AMMONIA in the last 168 hours. CBC: Recent Labs  Lab 05/04/21 1816 05/05/21 0647  WBC 10.5 7.8  NEUTROABS 8.2*  --   HGB 16.5* 14.9  HCT 48.6* 45.1  MCV 105.0* 105.4*  PLT 177 141*   Cardiac Enzymes: No results for input(s): CKTOTAL, CKMB, CKMBINDEX, TROPONINI in the last 168 hours. BNP: Invalid input(s): POCBNP CBG: Recent Labs  Lab 05/05/21 0828  GLUCAP 129*   D-Dimer No results for input(s): DDIMER in the last 72 hours. Hgb A1c No results for input(s): HGBA1C in the last 72 hours. Lipid Profile No results for input(s): CHOL, HDL, LDLCALC, TRIG, CHOLHDL, LDLDIRECT in the last 72 hours. Thyroid function studies No results for input(s): TSH, T4TOTAL, T3FREE, THYROIDAB in the last 72 hours.  Invalid input(s): FREET3 Anemia work up No results for input(s): VITAMINB12, FOLATE, FERRITIN, TIBC, IRON, RETICCTPCT in the last 72 hours. Urinalysis    Component Value Date/Time   COLORURINE YELLOW (A) 05/09/2021 1040   APPEARANCEUR CLEAR (A) 05/09/2021 1040   LABSPEC 1.025 05/09/2021 1040   PHURINE 5.0 05/09/2021 1040   GLUCOSEU NEGATIVE 05/09/2021 1040   HGBUR LARGE (A) 05/09/2021 1040   BILIRUBINUR NEGATIVE 05/09/2021 1040   KETONESUR NEGATIVE 05/09/2021 1040   PROTEINUR 30 (A) 05/09/2021 1040   UROBILINOGEN 1.0 05/29/2009 2253   NITRITE NEGATIVE 05/09/2021 1040   LEUKOCYTESUR NEGATIVE 05/09/2021 1040   Sepsis Labs Invalid input(s): PROCALCITONIN,  WBC,  LACTICIDVEN Microbiology Recent Results (from the past 240 hour(s))  Urine Culture     Status: Abnormal   Collection Time: 05/04/21  6:15 PM   Specimen: In/Out Cath Urine  Result Value Ref Range Status   Specimen Description   Final    IN/OUT CATH URINE Performed at [facility], [address], [place], [place] [address]    Special Requests   Final    NONE Performed at [facility], [address], [place], [place] [address]  Culture (A)  Final    20,000  COLONIES/mL ESCHERICHIA COLI Confirmed Extended Spectrum Beta-Lactamase Producer (ESBL).  In bloodstream infections from ESBL organisms, carbapenems are preferred over piperacillin/tazobactam. They are shown to have a lower risk of mortality. 60,000 COLONIES/mL LACTOBACILLUS SPECIES Standardized susceptibility testing for this organism is not available. Performed at [facility], [place] [address], [place], [place] [address]    Report Status 05/07/2021 FINAL  Final   Organism ID, Bacteria ESCHERICHIA COLI (A)  Final      Susceptibility   Escherichia coli - MIC*    AMPICILLIN >=32 RESISTANT Resistant     CEFAZOLIN >=64 RESISTANT Resistant     CEFEPIME <=0.12 SENSITIVE Sensitive     CEFTRIAXONE 0.5 SENSITIVE Sensitive     CIPROFLOXACIN <=0.25 SENSITIVE Sensitive     GENTAMICIN <=1 SENSITIVE Sensitive     IMIPENEM <=0.25 SENSITIVE Sensitive     NITROFURANTOIN <=16 SENSITIVE Sensitive     TRIMETH/SULFA <=20 SENSITIVE Sensitive     AMPICILLIN/SULBACTAM >=32 RESISTANT Resistant     PIP/TAZO 8 SENSITIVE Sensitive     * 20,000 COLONIES/mL ESCHERICHIA COLI  Resp Panel by RT-PCR (Flu A&B, Covid) Nasopharyngeal Swab     Status: None   Collection Time: 05/04/21  6:21 PM   Specimen: Nasopharyngeal Swab; Nasopharyngeal(NP) swabs in vial transport medium  Result Value Ref Range Status   SARS Coronavirus 2 by RT PCR NEGATIVE NEGATIVE Final    Comment: (NOTE) SARS-CoV-2 target nucleic acids are NOT DETECTED.  The SARS-CoV-2 RNA is generally detectable in upper respiratory specimens during the acute phase of infection. The lowest concentration of SARS-CoV-2 viral copies this assay can detect is 138 copies/mL. A negative result does not preclude SARS-Cov-2 infection and should not be used as the sole basis for treatment or other patient management decisions. A negative result may occur with  improper specimen collection/handling, submission of specimen other than nasopharyngeal swab, presence of  viral mutation(s) within the areas targeted by this assay, and inadequate number of viral copies(<138 copies/mL). A negative result must be combined with clinical observations, patient history, and epidemiological information. The expected result is Negative.  Fact Sheet for Patients:  EntrepreneurPulse.com.au  Fact Sheet for Healthcare Providers:  IncredibleEmployment.be  This test is no t yet approved or cleared by the [place] FDA and  has been authorized for detection and/or diagnosis of SARS-CoV-2 by FDA under an Emergency Use Authorization (EUA). This EUA will remain  in effect (meaning this test can be used) for the duration of the COVID-19 declaration under Section 564(b)(1) of the Act, 21 U.S.C.section 360bbb-3(b)(1), unless the authorization is terminated  or revoked sooner.       Influenza A by PCR NEGATIVE NEGATIVE Final   Influenza B by PCR NEGATIVE NEGATIVE Final    Comment: (NOTE) The Xpert Xpress SARS-CoV-2/FLU/RSV plus assay is intended as an aid in the diagnosis of influenza from Nasopharyngeal swab specimens and should not be used as a sole basis for treatment. Nasal washings and aspirates are unacceptable for Xpert Xpress SARS-CoV-2/FLU/RSV testing.  Fact Sheet for Patients: EntrepreneurPulse.com.au  Fact Sheet for Healthcare Providers: IncredibleEmployment.be  This test is not yet approved or cleared by the [place] FDA and has been authorized for detection and/or diagnosis of SARS-CoV-2 by FDA under an Emergency Use Authorization (EUA). This EUA will remain in effect (meaning this test can be used) for the duration of the COVID-19 declaration under Section 564(b)(1) of the Act, 21 U.S.C. section 360bbb-3(b)(1), unless the authorization  is terminated or revoked.  Performed at [facility], [address] [place], [place] [address]   Blood Culture (routine x  2)     Status: None   Collection Time: 05/04/21  6:21 PM   Specimen: BLOOD  Result Value Ref Range Status   Specimen Description BLOOD RIGHT [facility]  Final   Special Requests   Final    BOTTLES DRAWN AEROBIC AND ANAEROBIC Blood Culture adequate volume   Culture   Final    NO GROWTH 5 DAYS Performed at [facility], [address], [place], [place] [address]    Report Status 05/09/2021 FINAL  Final  Blood Culture (routine x 2)     Status: None   Collection Time: 05/04/21  6:26 PM   Specimen: BLOOD  Result Value Ref Range Status   Specimen Description BLOOD LEFT HAND  Final   Special Requests   Final    BOTTLES DRAWN AEROBIC AND ANAEROBIC Blood Culture adequate volume   Culture   Final    NO GROWTH 5 DAYS Performed at [facility], [address] [place], [place] [address]    Report Status 05/09/2021 FINAL  Final  MRSA Next Gen by PCR, Nasal     Status: None   Collection Time: 05/05/21  8:50 AM   Specimen: Nasal Mucosa; Nasal Swab  Result Value Ref Range Status   MRSA by PCR Next Gen NOT DETECTED NOT DETECTED Final    Comment: (NOTE) The GeneXpert MRSA Assay (FDA approved for NASAL specimens only), is one component of a comprehensive MRSA colonization surveillance program. It is not intended to diagnose MRSA infection nor to guide or monitor treatment for MRSA infections. Test performance is not FDA approved in patients less than 16 years old. Performed at [facility], [address], [place], [place] [address]      Time coordinating discharge: Over 30 minutes  SIGNED:   Sidney Ace, MD  [facility] 05/09/2021, 12:56 PM Pager   If 7PM-7AM, please contact night-coverage

## 2021-05-09 NOTE — Progress Notes (Signed)
Nutrition Follow-up  DOCUMENTATION CODES:   Obesity unspecified  INTERVENTION:   -Continue Ensure Enlive po BID, each supplement provides 350 kcal and 20 grams of protein  -MVI with minerals daily -Liberalize diet to regular  NUTRITION DIAGNOSIS:   Inadequate oral intake related to acute illness as evidenced by per patient/family report.  Progressing   GOAL:   Patient will meet greater than or equal to 90% of their needs  Progressing   MONITOR:   PO intake, Supplement acceptance, Labs, Weight trends, Skin, I & O's  REASON FOR ASSESSMENT:   Malnutrition Screening Tool    ASSESSMENT:   80 y/o female with h/o Afib, AAA, anxiety, depression, COPD, DVT, NSCLC, hiatal hernia, OSA and ovarian mass who is admitted with UTI and sepsis.  11/15- s/p BSE_ recommending mechanical soft/ regular consistency diet  Reviewed I/O's: -1.2 L x 24 hours and -2.2 L since admission  Spoke with pt, who reports she is "not feeling too well" today. She endorses a decreased appetite and consumed a few bites of sausage and pancake this morning. Noted documented meal completions 80-100%. Pt currently on a heart healthy diet and tolerating current diet textures well.   Pt does not recall being given Ensure supplements. She prefers chocolate Boost, but is willing to drink chocolate Ensure while hospitalized. Discussed importance of good meal and supplement intake to promote healing.   Medications reviewed and include lasix, megace, and vitamin B-12.   Labs reviewed: CBGS: 129 (inpatient orders for glycemic control are none).    Diet Order:   Diet Order             Diet 2 gram sodium Room service appropriate? Yes with Assist; Fluid consistency: Thin  Diet effective now                   EDUCATION NEEDS:   Education needs have been addressed  Skin:  Skin Assessment: Reviewed RN Assessment  Last BM:  05/07/21  Height:   Ht Readings from Last 1 Encounters:  05/05/21 5\' 6"  (1.676  m)    Weight:   Wt Readings from Last 1 Encounters:  05/05/21 93.3 kg    Ideal Body Weight:  59 kg  BMI:  Body mass index is 33.2 kg/m.  Estimated Nutritional Needs:   Kcal:  1800-2100kcal/day  Protein:  90-105g/day  Fluid:  1.5-1.8L/day    Loistine Chance, RD, LDN, [place] Registered Dietitian II Certified Diabetes Care and Education Specialist Please refer to AMION for RD and/or RD on-call/weekend/after hours pager

## 2021-05-09 NOTE — TOC Progression Note (Signed)
Transition of Care [facility]) - Progression Note    Patient Details  Name: [name] MRN: [id number] Date of Birth: Apr 05, 1941  [facility] CM/SW [place], RN Phone Number: 05/09/2021, 1:46 PM  Clinical Narrative:    Met with the patient in the room, She lives at [facility]  She stated that she has a rolling walker and oxygen at home I faxed her [facility] orders to [facility] [phone], they also have seen them in the Hub. I spoke with [name] and notified them that she will be discharged today.  The patient stated that her daughter was coming to transport her. No additional needs        Expected Discharge Plan and Services           Expected Discharge Date: 05/09/21                                     Social Determinants of Health (SDOH) Interventions    Readmission Risk Interventions No flowsheet data found.

## 2021-05-13 DIAGNOSIS — J9611 Chronic respiratory failure with hypoxia: Secondary | ICD-10-CM | POA: Diagnosis not present

## 2021-05-13 DIAGNOSIS — N3000 Acute cystitis without hematuria: Secondary | ICD-10-CM | POA: Diagnosis not present

## 2021-05-13 DIAGNOSIS — K224 Dyskinesia of esophagus: Secondary | ICD-10-CM | POA: Diagnosis not present

## 2021-05-13 DIAGNOSIS — J449 Chronic obstructive pulmonary disease, unspecified: Secondary | ICD-10-CM | POA: Diagnosis not present

## 2021-05-13 DIAGNOSIS — I5042 Chronic combined systolic (congestive) and diastolic (congestive) heart failure: Secondary | ICD-10-CM | POA: Diagnosis not present

## 2021-05-13 DIAGNOSIS — I4891 Unspecified atrial fibrillation: Secondary | ICD-10-CM | POA: Diagnosis not present

## 2021-05-21 DIAGNOSIS — Z9181 History of falling: Secondary | ICD-10-CM | POA: Diagnosis not present

## 2021-05-21 DIAGNOSIS — F419 Anxiety disorder, unspecified: Secondary | ICD-10-CM | POA: Diagnosis not present

## 2021-05-21 DIAGNOSIS — A4151 Sepsis due to Escherichia coli [E. coli]: Secondary | ICD-10-CM | POA: Diagnosis not present

## 2021-05-21 DIAGNOSIS — E785 Hyperlipidemia, unspecified: Secondary | ICD-10-CM | POA: Diagnosis not present

## 2021-05-21 DIAGNOSIS — I739 Peripheral vascular disease, unspecified: Secondary | ICD-10-CM | POA: Diagnosis not present

## 2021-05-21 DIAGNOSIS — Z7901 Long term (current) use of anticoagulants: Secondary | ICD-10-CM | POA: Diagnosis not present

## 2021-05-21 DIAGNOSIS — J9621 Acute and chronic respiratory failure with hypoxia: Secondary | ICD-10-CM | POA: Diagnosis not present

## 2021-05-21 DIAGNOSIS — I4891 Unspecified atrial fibrillation: Secondary | ICD-10-CM | POA: Diagnosis not present

## 2021-05-21 DIAGNOSIS — E039 Hypothyroidism, unspecified: Secondary | ICD-10-CM | POA: Diagnosis not present

## 2021-05-21 DIAGNOSIS — I5042 Chronic combined systolic (congestive) and diastolic (congestive) heart failure: Secondary | ICD-10-CM | POA: Diagnosis not present

## 2021-05-21 DIAGNOSIS — F32A Depression, unspecified: Secondary | ICD-10-CM | POA: Diagnosis not present

## 2021-05-21 DIAGNOSIS — N390 Urinary tract infection, site not specified: Secondary | ICD-10-CM | POA: Diagnosis not present

## 2021-05-21 DIAGNOSIS — I110 Hypertensive heart disease with heart failure: Secondary | ICD-10-CM | POA: Diagnosis not present

## 2021-05-21 DIAGNOSIS — J449 Chronic obstructive pulmonary disease, unspecified: Secondary | ICD-10-CM | POA: Diagnosis not present

## 2021-05-21 DIAGNOSIS — Z85118 Personal history of other malignant neoplasm of bronchus and lung: Secondary | ICD-10-CM | POA: Diagnosis not present

## 2021-05-28 DIAGNOSIS — A4151 Sepsis due to Escherichia coli [E. coli]: Secondary | ICD-10-CM | POA: Diagnosis not present

## 2021-05-28 DIAGNOSIS — J449 Chronic obstructive pulmonary disease, unspecified: Secondary | ICD-10-CM | POA: Diagnosis not present

## 2021-05-28 DIAGNOSIS — I110 Hypertensive heart disease with heart failure: Secondary | ICD-10-CM | POA: Diagnosis not present

## 2021-05-28 DIAGNOSIS — I5042 Chronic combined systolic (congestive) and diastolic (congestive) heart failure: Secondary | ICD-10-CM | POA: Diagnosis not present

## 2021-05-28 DIAGNOSIS — N390 Urinary tract infection, site not specified: Secondary | ICD-10-CM | POA: Diagnosis not present

## 2021-05-28 DIAGNOSIS — J9621 Acute and chronic respiratory failure with hypoxia: Secondary | ICD-10-CM | POA: Diagnosis not present

## 2021-05-29 DIAGNOSIS — J449 Chronic obstructive pulmonary disease, unspecified: Secondary | ICD-10-CM | POA: Diagnosis not present

## 2021-05-29 DIAGNOSIS — A4151 Sepsis due to Escherichia coli [E. coli]: Secondary | ICD-10-CM | POA: Diagnosis not present

## 2021-05-29 DIAGNOSIS — I5042 Chronic combined systolic (congestive) and diastolic (congestive) heart failure: Secondary | ICD-10-CM | POA: Diagnosis not present

## 2021-05-29 DIAGNOSIS — I110 Hypertensive heart disease with heart failure: Secondary | ICD-10-CM | POA: Diagnosis not present

## 2021-05-29 DIAGNOSIS — J9621 Acute and chronic respiratory failure with hypoxia: Secondary | ICD-10-CM | POA: Diagnosis not present

## 2021-05-29 DIAGNOSIS — N390 Urinary tract infection, site not specified: Secondary | ICD-10-CM | POA: Diagnosis not present

## 2021-05-30 DIAGNOSIS — A4151 Sepsis due to Escherichia coli [E. coli]: Secondary | ICD-10-CM | POA: Diagnosis not present

## 2021-05-30 DIAGNOSIS — J9621 Acute and chronic respiratory failure with hypoxia: Secondary | ICD-10-CM | POA: Diagnosis not present

## 2021-05-30 DIAGNOSIS — I4891 Unspecified atrial fibrillation: Secondary | ICD-10-CM | POA: Diagnosis not present

## 2021-05-30 DIAGNOSIS — J449 Chronic obstructive pulmonary disease, unspecified: Secondary | ICD-10-CM | POA: Diagnosis not present

## 2021-05-30 DIAGNOSIS — I5042 Chronic combined systolic (congestive) and diastolic (congestive) heart failure: Secondary | ICD-10-CM | POA: Diagnosis not present

## 2021-05-30 DIAGNOSIS — R269 Unspecified abnormalities of gait and mobility: Secondary | ICD-10-CM | POA: Diagnosis not present

## 2021-05-30 DIAGNOSIS — I110 Hypertensive heart disease with heart failure: Secondary | ICD-10-CM | POA: Diagnosis not present

## 2021-05-30 DIAGNOSIS — I10 Essential (primary) hypertension: Secondary | ICD-10-CM | POA: Diagnosis not present

## 2021-05-30 DIAGNOSIS — N390 Urinary tract infection, site not specified: Secondary | ICD-10-CM | POA: Diagnosis not present

## 2021-06-03 DIAGNOSIS — A4151 Sepsis due to Escherichia coli [E. coli]: Secondary | ICD-10-CM | POA: Diagnosis not present

## 2021-06-03 DIAGNOSIS — I110 Hypertensive heart disease with heart failure: Secondary | ICD-10-CM | POA: Diagnosis not present

## 2021-06-03 DIAGNOSIS — J9621 Acute and chronic respiratory failure with hypoxia: Secondary | ICD-10-CM | POA: Diagnosis not present

## 2021-06-03 DIAGNOSIS — J449 Chronic obstructive pulmonary disease, unspecified: Secondary | ICD-10-CM | POA: Diagnosis not present

## 2021-06-03 DIAGNOSIS — I5042 Chronic combined systolic (congestive) and diastolic (congestive) heart failure: Secondary | ICD-10-CM | POA: Diagnosis not present

## 2021-06-03 DIAGNOSIS — N390 Urinary tract infection, site not specified: Secondary | ICD-10-CM | POA: Diagnosis not present

## 2021-06-07 DIAGNOSIS — J449 Chronic obstructive pulmonary disease, unspecified: Secondary | ICD-10-CM | POA: Diagnosis not present

## 2021-06-07 DIAGNOSIS — I110 Hypertensive heart disease with heart failure: Secondary | ICD-10-CM | POA: Diagnosis not present

## 2021-06-07 DIAGNOSIS — N390 Urinary tract infection, site not specified: Secondary | ICD-10-CM | POA: Diagnosis not present

## 2021-06-07 DIAGNOSIS — I5042 Chronic combined systolic (congestive) and diastolic (congestive) heart failure: Secondary | ICD-10-CM | POA: Diagnosis not present

## 2021-06-07 DIAGNOSIS — A4151 Sepsis due to Escherichia coli [E. coli]: Secondary | ICD-10-CM | POA: Diagnosis not present

## 2021-06-07 DIAGNOSIS — J9621 Acute and chronic respiratory failure with hypoxia: Secondary | ICD-10-CM | POA: Diagnosis not present

## 2021-06-12 DIAGNOSIS — A4151 Sepsis due to Escherichia coli [E. coli]: Secondary | ICD-10-CM | POA: Diagnosis not present

## 2021-06-12 DIAGNOSIS — J9621 Acute and chronic respiratory failure with hypoxia: Secondary | ICD-10-CM | POA: Diagnosis not present

## 2021-06-12 DIAGNOSIS — N390 Urinary tract infection, site not specified: Secondary | ICD-10-CM | POA: Diagnosis not present

## 2021-06-12 DIAGNOSIS — J449 Chronic obstructive pulmonary disease, unspecified: Secondary | ICD-10-CM | POA: Diagnosis not present

## 2021-06-12 DIAGNOSIS — I110 Hypertensive heart disease with heart failure: Secondary | ICD-10-CM | POA: Diagnosis not present

## 2021-06-12 DIAGNOSIS — I5042 Chronic combined systolic (congestive) and diastolic (congestive) heart failure: Secondary | ICD-10-CM | POA: Diagnosis not present

## 2021-06-17 ENCOUNTER — Other Ambulatory Visit: Payer: Self-pay

## 2021-06-17 ENCOUNTER — Inpatient Hospital Stay
Admission: EM | Admit: 2021-06-17 | Discharge: 2021-06-25 | DRG: 308 | Disposition: A | Discharge status: Skilled Nursing Facility | Payer: Medicare Other | Source: Skilled Nursing Facility | Attending: Student | Admitting: Student

## 2021-06-17 ENCOUNTER — Emergency Department: Payer: Medicare Other

## 2021-06-17 ENCOUNTER — Encounter: Payer: Self-pay | Admitting: Emergency Medicine

## 2021-06-17 DIAGNOSIS — Z8249 Family history of ischemic heart disease and other diseases of the circulatory system: Secondary | ICD-10-CM

## 2021-06-17 DIAGNOSIS — G4733 Obstructive sleep apnea (adult) (pediatric): Secondary | ICD-10-CM | POA: Diagnosis present

## 2021-06-17 DIAGNOSIS — R4182 Altered mental status, unspecified: Secondary | ICD-10-CM | POA: Diagnosis not present

## 2021-06-17 DIAGNOSIS — I959 Hypotension, unspecified: Secondary | ICD-10-CM | POA: Diagnosis present

## 2021-06-17 DIAGNOSIS — G319 Degenerative disease of nervous system, unspecified: Secondary | ICD-10-CM | POA: Diagnosis not present

## 2021-06-17 DIAGNOSIS — I4891 Unspecified atrial fibrillation: Secondary | ICD-10-CM | POA: Diagnosis not present

## 2021-06-17 DIAGNOSIS — R9431 Abnormal electrocardiogram [ECG] [EKG]: Secondary | ICD-10-CM | POA: Diagnosis not present

## 2021-06-17 DIAGNOSIS — I10 Essential (primary) hypertension: Secondary | ICD-10-CM | POA: Diagnosis present

## 2021-06-17 DIAGNOSIS — R2681 Unsteadiness on feet: Secondary | ICD-10-CM | POA: Diagnosis not present

## 2021-06-17 DIAGNOSIS — I4821 Permanent atrial fibrillation: Secondary | ICD-10-CM | POA: Diagnosis not present

## 2021-06-17 DIAGNOSIS — Z961 Presence of intraocular lens: Secondary | ICD-10-CM | POA: Diagnosis present

## 2021-06-17 DIAGNOSIS — I509 Heart failure, unspecified: Secondary | ICD-10-CM | POA: Diagnosis not present

## 2021-06-17 DIAGNOSIS — I5082 Biventricular heart failure: Secondary | ICD-10-CM | POA: Diagnosis present

## 2021-06-17 DIAGNOSIS — Z66 Do not resuscitate: Secondary | ICD-10-CM | POA: Diagnosis present

## 2021-06-17 DIAGNOSIS — R410 Disorientation, unspecified: Secondary | ICD-10-CM

## 2021-06-17 DIAGNOSIS — E785 Hyperlipidemia, unspecified: Secondary | ICD-10-CM | POA: Diagnosis present

## 2021-06-17 DIAGNOSIS — Z86718 Personal history of other venous thrombosis and embolism: Secondary | ICD-10-CM | POA: Diagnosis not present

## 2021-06-17 DIAGNOSIS — F32A Depression, unspecified: Secondary | ICD-10-CM | POA: Diagnosis present

## 2021-06-17 DIAGNOSIS — I5043 Acute on chronic combined systolic (congestive) and diastolic (congestive) heart failure: Secondary | ICD-10-CM | POA: Diagnosis not present

## 2021-06-17 DIAGNOSIS — R0902 Hypoxemia: Secondary | ICD-10-CM

## 2021-06-17 DIAGNOSIS — J9811 Atelectasis: Secondary | ICD-10-CM | POA: Diagnosis present

## 2021-06-17 DIAGNOSIS — F419 Anxiety disorder, unspecified: Secondary | ICD-10-CM | POA: Diagnosis present

## 2021-06-17 DIAGNOSIS — Z9181 History of falling: Secondary | ICD-10-CM | POA: Diagnosis not present

## 2021-06-17 DIAGNOSIS — E871 Hypo-osmolality and hyponatremia: Secondary | ICD-10-CM | POA: Diagnosis not present

## 2021-06-17 DIAGNOSIS — Z736 Limitation of activities due to disability: Secondary | ICD-10-CM | POA: Diagnosis not present

## 2021-06-17 DIAGNOSIS — R4189 Other symptoms and signs involving cognitive functions and awareness: Secondary | ICD-10-CM | POA: Diagnosis present

## 2021-06-17 DIAGNOSIS — R52 Pain, unspecified: Secondary | ICD-10-CM | POA: Diagnosis not present

## 2021-06-17 DIAGNOSIS — J9621 Acute and chronic respiratory failure with hypoxia: Secondary | ICD-10-CM | POA: Diagnosis not present

## 2021-06-17 DIAGNOSIS — R5383 Other fatigue: Secondary | ICD-10-CM | POA: Diagnosis not present

## 2021-06-17 DIAGNOSIS — I428 Other cardiomyopathies: Secondary | ICD-10-CM | POA: Diagnosis present

## 2021-06-17 DIAGNOSIS — Z9842 Cataract extraction status, left eye: Secondary | ICD-10-CM

## 2021-06-17 DIAGNOSIS — S0990XA Unspecified injury of head, initial encounter: Secondary | ICD-10-CM | POA: Diagnosis not present

## 2021-06-17 DIAGNOSIS — I517 Cardiomegaly: Secondary | ICD-10-CM | POA: Diagnosis not present

## 2021-06-17 DIAGNOSIS — I714 Abdominal aortic aneurysm, without rupture, unspecified: Secondary | ICD-10-CM | POA: Diagnosis present

## 2021-06-17 DIAGNOSIS — Z79899 Other long term (current) drug therapy: Secondary | ICD-10-CM

## 2021-06-17 DIAGNOSIS — E039 Hypothyroidism, unspecified: Secondary | ICD-10-CM | POA: Diagnosis not present

## 2021-06-17 DIAGNOSIS — Z20822 Contact with and (suspected) exposure to covid-19: Secondary | ICD-10-CM | POA: Diagnosis present

## 2021-06-17 DIAGNOSIS — I4819 Other persistent atrial fibrillation: Secondary | ICD-10-CM | POA: Diagnosis not present

## 2021-06-17 DIAGNOSIS — G4709 Other insomnia: Secondary | ICD-10-CM | POA: Diagnosis not present

## 2021-06-17 DIAGNOSIS — I6529 Occlusion and stenosis of unspecified carotid artery: Secondary | ICD-10-CM | POA: Diagnosis not present

## 2021-06-17 DIAGNOSIS — I110 Hypertensive heart disease with heart failure: Secondary | ICD-10-CM | POA: Diagnosis present

## 2021-06-17 DIAGNOSIS — I272 Pulmonary hypertension, unspecified: Secondary | ICD-10-CM | POA: Diagnosis present

## 2021-06-17 DIAGNOSIS — M6282 Rhabdomyolysis: Secondary | ICD-10-CM | POA: Diagnosis present

## 2021-06-17 DIAGNOSIS — J99 Respiratory disorders in diseases classified elsewhere: Secondary | ICD-10-CM | POA: Diagnosis not present

## 2021-06-17 DIAGNOSIS — Z7901 Long term (current) use of anticoagulants: Secondary | ICD-10-CM

## 2021-06-17 DIAGNOSIS — E876 Hypokalemia: Secondary | ICD-10-CM | POA: Diagnosis not present

## 2021-06-17 DIAGNOSIS — A4151 Sepsis due to Escherichia coli [E. coli]: Secondary | ICD-10-CM | POA: Diagnosis not present

## 2021-06-17 DIAGNOSIS — I7123 Aneurysm of the descending thoracic aorta, without rupture: Secondary | ICD-10-CM | POA: Diagnosis present

## 2021-06-17 DIAGNOSIS — Z96652 Presence of left artificial knee joint: Secondary | ICD-10-CM | POA: Diagnosis present

## 2021-06-17 DIAGNOSIS — I5022 Chronic systolic (congestive) heart failure: Secondary | ICD-10-CM | POA: Diagnosis present

## 2021-06-17 DIAGNOSIS — Z85118 Personal history of other malignant neoplasm of bronchus and lung: Secondary | ICD-10-CM

## 2021-06-17 DIAGNOSIS — Z9981 Dependence on supplemental oxygen: Secondary | ICD-10-CM

## 2021-06-17 DIAGNOSIS — J439 Emphysema, unspecified: Secondary | ICD-10-CM | POA: Diagnosis not present

## 2021-06-17 DIAGNOSIS — I5042 Chronic combined systolic (congestive) and diastolic (congestive) heart failure: Secondary | ICD-10-CM

## 2021-06-17 DIAGNOSIS — R404 Transient alteration of awareness: Secondary | ICD-10-CM | POA: Diagnosis not present

## 2021-06-17 DIAGNOSIS — G9341 Metabolic encephalopathy: Secondary | ICD-10-CM | POA: Diagnosis present

## 2021-06-17 DIAGNOSIS — Z743 Need for continuous supervision: Secondary | ICD-10-CM | POA: Diagnosis not present

## 2021-06-17 DIAGNOSIS — J449 Chronic obstructive pulmonary disease, unspecified: Secondary | ICD-10-CM | POA: Diagnosis present

## 2021-06-17 DIAGNOSIS — I5021 Acute systolic (congestive) heart failure: Secondary | ICD-10-CM | POA: Diagnosis not present

## 2021-06-17 DIAGNOSIS — M6259 Muscle wasting and atrophy, not elsewhere classified, multiple sites: Secondary | ICD-10-CM | POA: Diagnosis not present

## 2021-06-17 DIAGNOSIS — R0689 Other abnormalities of breathing: Secondary | ICD-10-CM | POA: Diagnosis not present

## 2021-06-17 DIAGNOSIS — R000 Tachycardia, unspecified: Secondary | ICD-10-CM | POA: Diagnosis not present

## 2021-06-17 DIAGNOSIS — I739 Peripheral vascular disease, unspecified: Secondary | ICD-10-CM | POA: Diagnosis present

## 2021-06-17 DIAGNOSIS — N390 Urinary tract infection, site not specified: Secondary | ICD-10-CM | POA: Diagnosis not present

## 2021-06-17 DIAGNOSIS — I499 Cardiac arrhythmia, unspecified: Secondary | ICD-10-CM | POA: Diagnosis not present

## 2021-06-17 DIAGNOSIS — I5023 Acute on chronic systolic (congestive) heart failure: Secondary | ICD-10-CM | POA: Diagnosis not present

## 2021-06-17 DIAGNOSIS — Z833 Family history of diabetes mellitus: Secondary | ICD-10-CM

## 2021-06-17 DIAGNOSIS — Z87891 Personal history of nicotine dependence: Secondary | ICD-10-CM

## 2021-06-17 DIAGNOSIS — Z79890 Hormone replacement therapy (postmenopausal): Secondary | ICD-10-CM

## 2021-06-17 DIAGNOSIS — Z9841 Cataract extraction status, right eye: Secondary | ICD-10-CM

## 2021-06-17 LAB — CBC
HCT: 46.4 % — ABNORMAL HIGH (ref 36.0–46.0)
Hemoglobin: 15.2 g/dL — ABNORMAL HIGH (ref 12.0–15.0)
MCH: 33.8 pg (ref 26.0–34.0)
MCHC: 32.8 g/dL (ref 30.0–36.0)
MCV: 103.1 fL — ABNORMAL HIGH (ref 80.0–100.0)
Platelets: 145 10*3/uL — ABNORMAL LOW (ref 150–400)
RBC: 4.5 MIL/uL (ref 3.87–5.11)
RDW: 13.6 % (ref 11.5–15.5)
WBC: 8.5 10*3/uL (ref 4.0–10.5)
nRBC: 0 % (ref 0.0–0.2)

## 2021-06-17 LAB — COMPREHENSIVE METABOLIC PANEL
ALT: 16 U/L (ref 0–44)
AST: 36 U/L (ref 15–41)
Albumin: 3.6 g/dL (ref 3.5–5.0)
Alkaline Phosphatase: 48 U/L (ref 38–126)
Anion gap: 8 (ref 5–15)
BUN: 22 mg/dL (ref 8–23)
CO2: 24 mmol/L (ref 22–32)
Calcium: 9.6 mg/dL (ref 8.9–10.3)
Chloride: 105 mmol/L (ref 98–111)
Creatinine, Ser: 0.97 mg/dL (ref 0.44–1.00)
GFR, Estimated: 58 mL/min — ABNORMAL LOW (ref >60)
Glucose, Bld: 113 mg/dL — ABNORMAL HIGH (ref 70–99)
Potassium: 4.8 mmol/L (ref 3.5–5.1)
Sodium: 137 mmol/L (ref 135–145)
Total Bilirubin: 1.5 mg/dL — ABNORMAL HIGH (ref 0.3–1.2)
Total Protein: 7.1 g/dL (ref 6.5–8.1)

## 2021-06-17 LAB — CK: Total CK: 909 U/L — ABNORMAL HIGH (ref 38–234)

## 2021-06-17 LAB — BRAIN NATRIURETIC PEPTIDE: B Natriuretic Peptide: 363.5 pg/mL — ABNORMAL HIGH (ref 0.0–100.0)

## 2021-06-17 LAB — RESP PANEL BY RT-PCR (FLU A&B, COVID) ARPGX2
Influenza A by PCR: NEGATIVE
Influenza B by PCR: NEGATIVE
SARS Coronavirus 2 by RT PCR: NEGATIVE

## 2021-06-17 LAB — TROPONIN I (HIGH SENSITIVITY): Troponin I (High Sensitivity): 35 ng/L — ABNORMAL HIGH (ref <18)

## 2021-06-17 MED ORDER — TIOTROPIUM BROMIDE MONOHYDRATE 18 MCG IN CAPS
18.0000 ug | ORAL_CAPSULE | Freq: Every day | RESPIRATORY_TRACT | Status: DC
  Administered 2021-06-18 – 2021-06-24 (×7): 18 ug via RESPIRATORY_TRACT
  Filled 2021-06-17 (×4): qty 5

## 2021-06-17 MED ORDER — APIXABAN 5 MG PO TABS
5.0000 mg | ORAL_TABLET | Freq: Two times a day (BID) | ORAL | Status: DC
  Administered 2021-06-17 – 2021-06-25 (×17): 5 mg via ORAL
  Filled 2021-06-17 (×18): qty 1

## 2021-06-17 MED ORDER — FUROSEMIDE 10 MG/ML IJ SOLN
60.0000 mg | Freq: Once | INTRAMUSCULAR | Status: AC
  Administered 2021-06-17: 60 mg via INTRAVENOUS
  Filled 2021-06-17: qty 8

## 2021-06-17 MED ORDER — ALBUTEROL SULFATE (2.5 MG/3ML) 0.083% IN NEBU
2.5000 mg | INHALATION_SOLUTION | RESPIRATORY_TRACT | Status: DC
  Administered 2021-06-17 – 2021-06-18 (×4): 2.5 mg via RESPIRATORY_TRACT
  Filled 2021-06-17 (×4): qty 3

## 2021-06-17 MED ORDER — METOPROLOL TARTRATE 25 MG PO TABS
12.5000 mg | ORAL_TABLET | Freq: Two times a day (BID) | ORAL | Status: DC
  Administered 2021-06-17 – 2021-06-18 (×2): 12.5 mg via ORAL
  Filled 2021-06-17 (×2): qty 1

## 2021-06-17 MED ORDER — GABAPENTIN 100 MG PO CAPS
100.0000 mg | ORAL_CAPSULE | Freq: Three times a day (TID) | ORAL | Status: DC
  Administered 2021-06-17 – 2021-06-25 (×24): 100 mg via ORAL
  Filled 2021-06-17 (×24): qty 1

## 2021-06-17 MED ORDER — HYDRALAZINE HCL 20 MG/ML IJ SOLN: 5.0000 mg | INTRAMUSCULAR | Status: DC | PRN

## 2021-06-17 MED ORDER — OXYCODONE HCL 5 MG PO TABS
5.0000 mg | ORAL_TABLET | ORAL | Status: DC | PRN
  Administered 2021-06-18 – 2021-06-20 (×5): 5 mg via ORAL
  Filled 2021-06-17 (×5): qty 1

## 2021-06-17 MED ORDER — FUROSEMIDE 10 MG/ML IJ SOLN
40.0000 mg | Freq: Two times a day (BID) | INTRAMUSCULAR | Status: AC
  Administered 2021-06-18 – 2021-06-21 (×8): 40 mg via INTRAVENOUS
  Filled 2021-06-17 (×8): qty 4

## 2021-06-17 MED ORDER — TRAZODONE HCL 50 MG PO TABS
25.0000 mg | ORAL_TABLET | Freq: Every evening | ORAL | Status: DC | PRN
  Administered 2021-06-18 – 2021-06-25 (×4): 25 mg via ORAL
  Filled 2021-06-17 (×4): qty 1

## 2021-06-17 MED ORDER — MORPHINE SULFATE (PF) 2 MG/ML IV SOLN: 2.0000 mg | INTRAVENOUS | Status: DC | PRN

## 2021-06-17 MED ORDER — POLYETHYLENE GLYCOL 3350 17 G PO PACK: 17.0000 g | PACK | Freq: Every day | ORAL | Status: DC | PRN

## 2021-06-17 MED ORDER — SODIUM CHLORIDE 0.9% FLUSH
3.0000 mL | Freq: Two times a day (BID) | INTRAVENOUS | Status: DC
  Administered 2021-06-17 – 2021-06-25 (×15): 3 mL via INTRAVENOUS

## 2021-06-17 MED ORDER — ONDANSETRON HCL 4 MG/2ML IJ SOLN: 4.0000 mg | Freq: Four times a day (QID) | INTRAMUSCULAR | Status: DC | PRN

## 2021-06-17 MED ORDER — DILTIAZEM HCL 25 MG/5ML IV SOLN
10.0000 mg | Freq: Once | INTRAVENOUS | Status: AC
  Administered 2021-06-17: 18:00:00 10 mg via INTRAVENOUS
  Filled 2021-06-17: qty 5

## 2021-06-17 MED ORDER — LEVOTHYROXINE SODIUM 88 MCG PO TABS
88.0000 ug | ORAL_TABLET | Freq: Every day | ORAL | Status: DC
  Administered 2021-06-18 – 2021-06-25 (×8): 88 ug via ORAL
  Filled 2021-06-17 (×9): qty 1

## 2021-06-17 MED ORDER — DOCUSATE SODIUM 100 MG PO CAPS
100.0000 mg | ORAL_CAPSULE | Freq: Two times a day (BID) | ORAL | Status: DC
  Administered 2021-06-17 – 2021-06-25 (×17): 100 mg via ORAL
  Filled 2021-06-17 (×17): qty 1

## 2021-06-17 MED ORDER — CITALOPRAM HYDROBROMIDE 20 MG PO TABS
20.0000 mg | ORAL_TABLET | Freq: Every day | ORAL | Status: DC
  Administered 2021-06-18 – 2021-06-25 (×8): 20 mg via ORAL
  Filled 2021-06-17 (×8): qty 1

## 2021-06-17 MED ORDER — ONDANSETRON HCL 4 MG PO TABS: 4.0000 mg | ORAL_TABLET | Freq: Four times a day (QID) | ORAL | Status: DC | PRN

## 2021-06-17 MED ORDER — ACETAMINOPHEN 650 MG RE SUPP: 650.0000 mg | Freq: Four times a day (QID) | RECTAL | Status: DC | PRN

## 2021-06-17 MED ORDER — BISACODYL 5 MG PO TBEC: 5.0000 mg | DELAYED_RELEASE_TABLET | Freq: Every day | ORAL | Status: DC | PRN

## 2021-06-17 MED ORDER — NICOTINE 14 MG/24HR TD PT24
14.0000 mg | MEDICATED_PATCH | Freq: Every day | TRANSDERMAL | Status: DC
  Filled 2021-06-17 (×7): qty 1

## 2021-06-17 MED ORDER — ACETAMINOPHEN 325 MG PO TABS
650.0000 mg | ORAL_TABLET | Freq: Four times a day (QID) | ORAL | Status: DC | PRN
  Administered 2021-06-18 – 2021-06-23 (×2): 650 mg via ORAL
  Filled 2021-06-17 (×2): qty 2

## 2021-06-17 MED ORDER — TEMAZEPAM 15 MG PO CAPS: 15.0000 mg | ORAL_CAPSULE | Freq: Every evening | ORAL | Status: DC | PRN

## 2021-06-17 NOTE — ED Notes (Signed)
Called lab for adding on CK and verifying other labs in process because doctor was checking also on BMP

## 2021-06-17 NOTE — H&P (Signed)
History and Physical    [name] CZY:[id number] DOB: 08-09-1940 DOA: 06/17/2021  PCP: Burnard Bunting, MD    [name]   Chief Complaint:  AMS/ SOB.   HPI:  [name] is a 80 y.o. female seen in ed with complaints of AMS and fall today , pt slid out of bed this morning no head injury. Per EMS pt in Afib rvr , COPD, DVT on Eliquis, A. fib on Eliquis, hypertension, hyperlipidemia, congestive heart failure.  123 glucose, RR 40, afebrile and 134/86.  Patient currently on 6 L of oxygen and by nasal cannula chronically she is on 3 L. Pt also in a.fib rvr in ed. patient was last admitted on 17th November of this year for severe sepsis secondary to UTI, A. fib RVR. Pt starts by saying her name and tried to answer but is disoriented and cannot answer questions.  Pt has past medical history of abdominal aortic aneurysm, long-term  anticoagulation with Eliquis, COPD, A. fib/flutter, DVT, esophageal dysmotility hypothyroidism, hyperlipidemia, hypertension.  ED Course:  Vitals:   06/17/21 1930 06/17/21 1945 06/17/21 2000 06/17/21 2015  BP:      Pulse: (!) 118 100 (!) 114 (!) 104  Resp: 19 (!) 27 (!) 22 (!) 25  Temp:      TempSrc:      SpO2: 94% 91% 93% 94%  Weight:      Height:      In the emergency room patient is alert awake oriented and hypoxic on 6 L nasal cannula.labs shows glucose of 113 and T of 1.5, o/w normal; lft. BNP of 363.5 and cpk elevation of 909.  In ed pt was in s.fib rvr on telemetry.  Cbc shows normal wbc of 8.5 and hb of 15.2 and platelet of 145. Resp panel neg for covid and flu. Head ct neg for any acute IC abnormality. In ed pt got lasix 60 and diltiazem 10 mg.    Review of Systems:  Review of Systems  Unable to perform ROS: Dementia  Respiratory:  Positive for shortness of breath.   All other systems reviewed and are negative.   Past Medical History:  Diagnosis Date   AAA (abdominal aortic aneurysm)     Anticoagulant long-term use    Failed on Coumadin. On Xarelto   Anxiety    Anxiety and depression    Atrial fib/flutter, transient June 2012   Chronic lower back pain    COPD (chronic obstructive pulmonary disease) ([place])    DVT (deep venous thrombosis) ([place]) 2004   BLE   Esophageal dysmotility    Exertional dyspnea    Hiatal hernia    History of bronchitis    History of fibrocystic disease of breast    History of uterine fibroid    HTN (hypertension)    Hyperlipidemia    Hypothyroidism    Normal nuclear stress test 2012   May 2012   OA (osteoarthritis)    "knees; left shoulder"   OSA (obstructive sleep apnea)    "haven't been using my CPAP lately" (01/21/12)   Ovarian mass    right benign   Ovarian mass    benign, right   PAD (peripheral artery disease) ([place])    Small cell carcinoma of lung ([place]) 2004   NON-SMALL CELL CARCINOMA OF THE LUNG, METASTATIC TO THE SUPRACLAVICULAR AND MEDIASTINAL LYMPH NODES; in remission    Past Surgical History:  Procedure Laterality Date   ABDOMINAL AORTIC ANEURYSM REPAIR  ~  2010   stent graft   BLADDER SURGERY  ~ 2003   sling   BREAST BIOPSY  1960's   both breast's - benign   CARDIOVASCULAR STRESS TEST  2012   No ischemia   CATARACT EXTRACTION W/ INTRAOCULAR LENS  IMPLANT, BILATERAL Bilateral ~ 2009   REPLACEMENT TOTAL KNEE Left ~ 2008   left   THYROIDECTOMY  ~ 1964     reports that she quit smoking about 20 years ago. Her smoking use included cigarettes. She has a 40.00 pack-year smoking history. She has never used smokeless tobacco. She reports current alcohol use. She reports that she does not use drugs.  No Known Allergies  Family History  Problem Relation Age of Onset   Heart disease Father    Heart attack Father    Diabetes type II Mother    Diabetes Mother    Hypertension Mother    Heart disease Brother        before age 21   Heart disease Sister        See's Dr. Acie Fredrickson   Breast cancer Neg Hx     Prior to  Admission medications   Medication Sig Start Date End Date Taking? Authorizing Provider  apixaban (ELIQUIS) 5 MG TABS tablet Take 1 tablet (5 mg total) by mouth 2 (two) times daily. 06/27/20  Yes [place], Peter M, MD  Cholecalciferol (VITAMIN D) 1000 UNITS capsule Take 1,000 Units by mouth daily.   Yes [provider]  citalopram (CELEXA) 20 MG tablet Take 20 mg by mouth daily.   Yes [provider]  digoxin (LANOXIN) 0.125 MG tablet Take 1 tablet (125 mcg total) by mouth daily. 12/21/20  Yes [place], Peter M, MD  furosemide (LASIX) 40 MG tablet Take 2 tablets ( 80 mg ) daily 12/21/20  Yes [place], Peter M, MD  gabapentin (NEURONTIN) 100 MG capsule Take 1 capsule by mouth 3 (three) times daily.   Yes [provider]  guaiFENesin (MUCINEX) 600 MG 12 hr tablet Take 1,200 mg by mouth 2 (two) times daily.   Yes [provider]  levothyroxine (SYNTHROID, LEVOTHROID) 88 MCG tablet Take 88 mcg by mouth daily.   Yes [provider]  megestrol (MEGACE) 40 MG tablet Take 40 mg by mouth 2 (two) times daily.   Yes [provider]  metoprolol tartrate (LOPRESSOR) 25 MG tablet TAKE 1/2 TABLET TWICE DAILY Patient taking differently: Take 12.5 mg by mouth 2 (two) times daily. 11/08/20  Yes [place], Peter M, MD  morphine (MSIR) 15 MG tablet Take 5 mg by mouth as needed. 10/13/16  Yes [provider]  omeprazole (PRILOSEC) 40 MG capsule Take 40 mg by mouth daily.   Yes [provider]  potassium chloride (KLOR-CON) 10 MEQ tablet Take 1 tablet (10 mEq total) by mouth 2 (two) times daily. 12/21/20  Yes [place], Peter M, MD  tiotropium (SPIRIVA HANDIHALER) 18 MCG inhalation capsule Place 1 capsule (18 mcg total) into inhaler and inhale daily. 09/10/20  Yes Young, Tarri Fuller D, MD  vitamin B-12 (CYANOCOBALAMIN) 100 MCG tablet Take 100 mcg by mouth daily.   Yes [provider]  acetaminophen (TYLENOL) 500 MG tablet Take 2 tablets (1,000 mg total) by mouth  every 6 (six) hours as needed for fever, headache, mild pain or moderate pain (backpain). 05/09/21   Sidney Ace, MD  albuterol (VENTOLIN HFA) 108 (90 Base) MCG/ACT inhaler TAKE 2 PUFFS EVERY 6 HOURS AS NEEDED FORSHORTNESS OF BREATH/WHEEZING 09/10/20   Young,  Kasandra Knudsen, MD  metolazone (ZAROXOLYN) 2.5 MG tablet Take 1 tablet (2.5 mg total) by mouth daily as needed (ONE DAILY PRN WEIGHT GAIN). USE AS NEEDED FOR WEIGHT GAIN 09/06/19   Deberah Pelton, NP  mupirocin ointment (BACTROBAN) 2 % mupirocin 2 % topical ointment  APPLY TO WOUNDS ON SKIN ONCE DAILY AT BANDAGE CHANGES Patient not taking: Reported on 06/17/2021    [provider]  OXYGEN Inhale 4 L into the lungs daily. Patient states she wears 2L at night and 4L when up and moving    [provider]  pantoprazole (PROTONIX) 40 MG tablet Take 1 tablet (40 mg total) by mouth daily. 05/10/21 06/09/21  Sidney Ace, MD  temazepam (RESTORIL) 15 MG capsule TAKE 1 CAPSULE AT BEDTIME AS NEEDED  FOR  SLEEP 07/16/16   Deneise Lever, MD    Physical Exam: Vitals:   06/17/21 1930 06/17/21 1945 06/17/21 2000 06/17/21 2015  BP:      Pulse: (!) 118 100 (!) 114 (!) 104  Resp: 19 (!) 27 (!) 22 (!) 25  Temp:      TempSrc:      SpO2: 94% 91% 93% 94%  Weight:      Height:       Physical Exam Constitutional:      General: She is not in acute distress.    Appearance: She is obese. She is not ill-appearing.  HENT:     Head: Normocephalic and atraumatic.     Right Ear: External ear normal.     Left Ear: External ear normal.     Nose: Nose normal.     Mouth/Throat:     Mouth: Mucous membranes are moist.  Eyes:     Extraocular Movements: Extraocular movements intact.     Pupils: Pupils are equal, round, and reactive to light.  Cardiovascular:     Rate and Rhythm: Regular rhythm.     Pulses: Normal pulses.     Heart sounds: Normal heart sounds.  Pulmonary:     Effort: Pulmonary effort is normal.     Breath sounds:  Rales present.  Abdominal:     General: Bowel sounds are normal. There is no distension.     Palpations: Abdomen is soft. There is no mass.     Tenderness: There is no abdominal tenderness. There is no guarding.     Hernia: No hernia is present.  Musculoskeletal:     Right lower leg: Edema present.     Left lower leg: Edema present.  Skin:    Findings: No lesion or rash.  Neurological:     General: No focal deficit present.     Mental Status: She is alert and oriented to person, place, and time.  Psychiatric:        Mood and Affect: Mood normal.        Behavior: Behavior normal.     Labs on Admission: I have personally reviewed following labs and imaging studies  Recent Labs    06/17/21 1802  CKTOTAL 909*   Lab Results  Component Value Date   WBC 8.5 06/17/2021   HGB 15.2 (H) 06/17/2021   HCT 46.4 (H) 06/17/2021   MCV 103.1 (H) 06/17/2021   PLT 145 (L) 06/17/2021    Recent Labs  Lab 06/17/21 1802  NA 137  K 4.8  CL 105  CO2 24  BUN 22  CREATININE 0.97  CALCIUM 9.6  PROT 7.1  BILITOT 1.5*  ALKPHOS 48  ALT  16  AST 36  GLUCOSE 113*   No results found for: CHOL, HDL, LDLCALC, TRIG Lab Results  Component Value Date   DDIMER 1.87 (H) 01/20/2012   Invalid input(s): POCBNP   COVID-19 Labs No results for input(s): DDIMER, FERRITIN, LDH, CRP in the last 72 hours. Lab Results  Component Value Date   SARSCOV2NAA NEGATIVE 06/17/2021   SARSCOV2NAA NEGATIVE 05/04/2021   SARSCOV2NAA Not Detected 03/25/2019    Radiological Exams on Admission: CT HEAD WO CONTRAST (5MM)  Result Date: 06/17/2021 CLINICAL DATA:  Head trauma unwitnessed fall EXAM: CT HEAD WITHOUT CONTRAST TECHNIQUE: Contiguous axial images were obtained from the base of the skull through the vertex without intravenous contrast. COMPARISON:  CT brain 07/05/2014 FINDINGS: Brain: No acute territorial infarction, hemorrhage or intracranial mass. Extensive white matter hypodensity progressed compared to  prior. Age indeterminate hypodensity in the right thalamus. Normal ventricle size. Mild atrophy. Vascular: No hyperdense vessels.  Carotid vascular calcification Skull: Normal. Negative for fracture or focal lesion. Sinuses/Orbits: Opacified left maxillary sinus. Other: None IMPRESSION: 1. No definite CT evidence for acute intracranial abnormality. 2. Small age indeterminate hypodensity in the right thalamus which may reflect lacunar infarct or small vessel disease. Extensive white matter hypodensity consistent with chronic small vessel ischemic change, progressed compared to prior head CT. Electronically Signed   By: Donavan Foil M.D.   On: 06/17/2021 19:18   DG Chest Portable 1 View  Result Date: 06/17/2021 CLINICAL DATA:  Hypoxia EXAM: PORTABLE CHEST 1 VIEW COMPARISON:  May 05, 2021 FINDINGS: Enlarged cardiac silhouette, similar prior. Low lung volumes with bibasilar atelectasis. No significant interval change in the persistent bilateral interstitial thickening. No new focal consolidation. No visible pleural effusion or pneumothorax. The visualized skeletal structures are unchanged. IMPRESSION: 1. No significant interval change in the persistent bilateral interstitial thickening with superimposed atelectasis. 2. Stable cardiomegaly. Electronically Signed   By: Dahlia Bailiff M.D.   On: 06/17/2021 16:10    EKG: Independently reviewed.  A.fib at 126 rvr, lad and normal st  and t waves.  Assessment/Plan: Principal Problem:   AMS (altered mental status) Active Problems:   COPD (chronic obstructive pulmonary disease) (HCC)   Hypothyroid   HTN (hypertension)   Atrial fibrillation with RVR (HCC)   Chronic combined systolic and diastolic CHF (congestive heart failure) (HCC)   Acute on chronic respiratory failure with hypoxia (HCC) AMS: Pt is alert but disoriented and reports SOB. Is slow and perseverates with answers.   COPD: Cont PRN MDI Spiriva Currently supplemental oxygen. SpO2: 92  % O2 Flow Rate (L/min): 6 L/min   Hypothyroid: We will continue levothyroxine at 88 mcg.  HTN: Blood pressure 135/86, pulse (!) 104, temperature 98.7 F (37.1 C), temperature source Axillary, resp. rate (!) 25, height 5\' 6"  (1.676 m), weight 93.3 kg, SpO2 94 %. We will restart  metoprolol, as needed Lasix.  Atrial fibrillation with RVR: We will continue patient on Eliquis and digoxin. Will check digoxin level.  Monitor potassium level and replace to prevent medication toxicity and complications.  Chronic combined systolic diastolic congestive heart failure: Again patient continued on digoxin, as needed Lasix, potassium. Patient's chart also has Zaroxolyn we will wait for med rec and reorder appropriate meds.   Acute on chronic respiratory failure with hypoxia: SpO2: 94 % O2 Flow Rate (L/min): 6 L/min Pt stable we will administer supplemental oxygen and as needed diuretics. 2D echo per a.m. team.  Patient was given 60 of Lasix in the emergency room.  We will  also obtain an ABG. In the meantime we will continue patient on apixaban, bisoprolol, digoxin, metolazone as needed.  Rhabdomyolysis: Cpk of 900's. Will encourage po intake and ivf at 20 cc/ hour x 12 hours.  Urinalysis.   DVT prophylaxis:  Eliquis    Code Status:  DNR   Family Communication:  Leialoha, Hanna (Daughter)  [phone] (Mobile)   Disposition Plan:  [place]    Consults called:  None   Admission status: Inpatient.     Para Skeans MD [facility] [phone] How to contact the [facility] Attending or Consulting provider [place] or covering provider during after hours [place], for this patient.    Check the care team in [facility] and look for a) attending/consulting [facility] provider listed and b) the [facility] team listed Log into [email or web address] and use [place]'s universal password to access. If you do not have the password, please contact the hospital operator. Locate the [facility] provider you are looking for  under [facility] and page to a number that you can be directly reached. If you still have difficulty reaching the provider, please page the [facility] (Director on Call) for the Hospitalists listed on amion for assistance. [email or web address] Password [facility] 06/18/2021, 12:55 AM

## 2021-06-17 NOTE — ED Triage Notes (Signed)
Arrives from [place] for ED evaluation of confusion today. NO history of dementia, wears 3l/ [place] home oxygen, from [place].  ? Unwhitnessed fall this morning, slid  out of bed.  Patient denies head injury.  Per EMS report, Afib\ RVR.  CBG:  123.  R:  40.  T 98.1.  134/86.  STroke screen negative.  20 g RAC

## 2021-06-17 NOTE — ED Provider Notes (Signed)
[facility] Emergency Department Provider Note  Time seen: 3:44 PM  I have reviewed the triage vital signs and the nursing notes.   HISTORY  Chief Complaint Altered Mental Status   HPI [name] is a 80 y.o. female with a past medical history of anxiety, COPD, DVT on Eliquis, atrial fibrillation, hypertension, hyperlipidemia, CHF, presents to the emergency department for confusion and shortness of breath.  According to report patient is coming from [place] where she was noted to be confused.  Patient typically wears 3 L of oxygen currently wearing 6 L satting 89 to 90%.  Patient noted to have lower extremity edema.  Patient is not sure why she is in the hospital, does states she feels short of breath on review of systems questioning.  Patient noted to be in A. fib with RVR around 130 bpm.   Past Medical History:  Diagnosis Date   AAA (abdominal aortic aneurysm)    Anticoagulant long-term use    Failed on Coumadin. On Xarelto   Anxiety    Anxiety and depression    Atrial fib/flutter, transient June 2012   Chronic lower back pain    COPD (chronic obstructive pulmonary disease) ([place])    DVT (deep venous thrombosis) ([place]) 2004   BLE   Esophageal dysmotility    Exertional dyspnea    Hiatal hernia    History of bronchitis    History of fibrocystic disease of breast    History of uterine fibroid    HTN (hypertension)    Hyperlipidemia    Hypothyroidism    Normal nuclear stress test 2012   May 2012   OA (osteoarthritis)    "knees; left shoulder"   OSA (obstructive sleep apnea)    "haven't been using my CPAP lately" (01/21/12)   Ovarian mass    right benign   Ovarian mass    benign, right   PAD (peripheral artery disease) ([place])    Small cell carcinoma of lung ([place]) 2004   NON-SMALL CELL CARCINOMA OF THE LUNG, METASTATIC TO THE SUPRACLAVICULAR AND MEDIASTINAL LYMPH NODES; in remission    Patient Active Problem List   Diagnosis  Date Noted   Acute on chronic respiratory failure with hypoxia ([place]) 53/61/4431   Acute metabolic encephalopathy 54/00/8676   UTI (urinary tract infection)    Severe sepsis (H[facility])    Anticoagulant long-term use    Physical deconditioning 06/16/2019   Hypokalemia    Chronic combined systolic and diastolic CHF (congestive heart failure) ([place]) 09/15/2016   Hyperlipidemia    Chronic respiratory failure with hypoxia ([place]) 12/03/2014   Insomnia, chronic 01/27/2014   Atrial fibrillation with RVR ([place])    Multiple thyroid nodules 10/17/2012   AAA (abdominal aortic aneurysm) 05/11/2012   Bradycardia 01/23/2012   Hypothyroid 01/21/2012   HTN (hypertension) 01/21/2012   Edema 11/19/2011   Atrial flutter ([place]) 11/20/2010   Obstructive sleep apnea 10/16/2008   DVT 08/19/2008   COPD (chronic obstructive pulmonary disease) ([place]) 08/19/2008   NEOPLASM, MALIGNANT, LUNG, HX OF 19/50/9326   Diastolic heart failure, NYHA class 2 ([place]) 08/03/2008   Small cell carcinoma of lung ([place]) 06/23/2002   Hyperbilirubinemia 2004    Past Surgical History:  Procedure Laterality Date   ABDOMINAL AORTIC ANEURYSM REPAIR  ~ 2010   stent graft   BLADDER SURGERY  ~ 2003   sling   BREAST BIOPSY  1960's   both breast's - benign   CARDIOVASCULAR STRESS TEST  2012   No  ischemia   CATARACT EXTRACTION W/ INTRAOCULAR LENS  IMPLANT, BILATERAL Bilateral ~ 2009   REPLACEMENT TOTAL KNEE Left ~ 2008   left   THYROIDECTOMY  ~ 1964    Prior to Admission medications   Medication Sig Start Date End Date Taking? Authorizing Provider  apixaban (ELIQUIS) 5 MG TABS tablet Take 1 tablet (5 mg total) by mouth 2 (two) times daily. 06/27/20  Yes [place], Peter M, MD  Cholecalciferol (VITAMIN D) 1000 UNITS capsule Take 1,000 Units by mouth daily.   Yes [provider]  citalopram (CELEXA) 20 MG tablet Take 20 mg by mouth daily.   Yes [provider]  digoxin (LANOXIN) 0.125 MG tablet Take 1 tablet (125 mcg total) by  mouth daily. 12/21/20  Yes [place], Peter M, MD  furosemide (LASIX) 40 MG tablet Take 2 tablets ( 80 mg ) daily 12/21/20  Yes [place], Peter M, MD  gabapentin (NEURONTIN) 100 MG capsule Take 1 capsule by mouth 3 (three) times daily.   Yes [provider]  guaiFENesin (MUCINEX) 600 MG 12 hr tablet Take 1,200 mg by mouth 2 (two) times daily.   Yes [provider]  levothyroxine (SYNTHROID, LEVOTHROID) 88 MCG tablet Take 88 mcg by mouth daily.   Yes [provider]  megestrol (MEGACE) 40 MG tablet Take 40 mg by mouth 2 (two) times daily.   Yes [provider]  metoprolol tartrate (LOPRESSOR) 25 MG tablet TAKE 1/2 TABLET TWICE DAILY Patient taking differently: Take 12.5 mg by mouth 2 (two) times daily. 11/08/20  Yes [place], Peter M, MD  morphine (MSIR) 15 MG tablet Take 5 mg by mouth as needed. 10/13/16  Yes [provider]  omeprazole (PRILOSEC) 40 MG capsule Take 40 mg by mouth daily.   Yes [provider]  potassium chloride (KLOR-CON) 10 MEQ tablet Take 1 tablet (10 mEq total) by mouth 2 (two) times daily. 12/21/20  Yes [place], Peter M, MD  tiotropium (SPIRIVA HANDIHALER) 18 MCG inhalation capsule Place 1 capsule (18 mcg total) into inhaler and inhale daily. 09/10/20  Yes Young, Tarri Fuller D, MD  vitamin B-12 (CYANOCOBALAMIN) 100 MCG tablet Take 100 mcg by mouth daily.   Yes [provider]  acetaminophen (TYLENOL) 500 MG tablet Take 2 tablets (1,000 mg total) by mouth every 6 (six) hours as needed for fever, headache, mild pain or moderate pain (backpain). 05/09/21   Sidney Ace, MD  albuterol (VENTOLIN HFA) 108 (90 Base) MCG/ACT inhaler TAKE 2 PUFFS EVERY 6 HOURS AS NEEDED FORSHORTNESS OF BREATH/WHEEZING 09/10/20   Baird Lyons D, MD  metolazone (ZAROXOLYN) 2.5 MG tablet Take 1 tablet (2.5 mg total) by mouth daily as needed (ONE DAILY PRN WEIGHT GAIN). USE AS NEEDED FOR WEIGHT GAIN 09/06/19   Deberah Pelton, NP  mupirocin ointment  (BACTROBAN) 2 % mupirocin 2 % topical ointment  APPLY TO WOUNDS ON SKIN ONCE DAILY AT BANDAGE CHANGES Patient not taking: Reported on 06/17/2021    [provider]  OXYGEN Inhale 4 L into the lungs daily. Patient states she wears 2L at night and 4L when up and moving    [provider]  pantoprazole (PROTONIX) 40 MG tablet Take 1 tablet (40 mg total) by mouth daily. 05/10/21 06/09/21  Sidney Ace, MD  temazepam (RESTORIL) 15 MG capsule TAKE 1 CAPSULE AT BEDTIME AS NEEDED  FOR  SLEEP 07/16/16   Deneise Lever, MD    No Known Allergies  Family History  Problem Relation Age of  Onset   Heart disease Father    Heart attack Father    Diabetes type II Mother    Diabetes Mother    Hypertension Mother    Heart disease Brother        before age 64   Heart disease Sister        See's Dr. Acie Fredrickson   Breast cancer Neg Hx     Social History Social History   Tobacco Use   Smoking status: Former    Packs/day: 1.00    Years: 40.00    Pack years: 40.00    Types: Cigarettes    Quit date: 06/23/2001    Years since quitting: 19.9   Smokeless tobacco: Never  Vaping Use   Vaping Use: Never used  Substance Use Topics   Alcohol use: Yes    Alcohol/week: 0.0 standard drinks    Comment: once a week (glass of wine)   Drug use: No    Review of Systems Unable to obtain adequate/accurate review of systems secondary to confusion/altered mental state ____________________________________________   PHYSICAL EXAM:  VITAL SIGNS: ED Triage Vitals  Enc Vitals Group     BP 06/17/21 1441 (!) 138/99     Pulse Rate 06/17/21 1441 (!) 126     Resp 06/17/21 1441 (!) 25     Temp 06/17/21 1441 98.7 F (37.1 C)     Temp Source 06/17/21 1441 Axillary     SpO2 06/17/21 1441 92 %     Weight 06/17/21 1440 205 lb 11 oz (93.3 kg)     Height 06/17/21 1440 5\' 6"  (1.676 m)     Head Circumference --      Peak Flow --      Pain Score 06/17/21 1440 0     Pain Loc --      Pain Edu? --       Excl. in [place]? --    Constitutional: Patient is awake alert watching TV, no acute distress.  Currently on 6 L of oxygen satting 89 to 90%. Eyes: Normal exam ENT      Head: Normocephalic and atraumatic.      Mouth/Throat: Mucous membranes are moist. Cardiovascular: Irregular rhythm rate around 120 bpm. Respiratory: Mild tachypnea with diminished lung sounds bilaterally but no obvious wheeze rales or rhonchi. Gastrointestinal: Soft and nontender. No distention.  Musculoskeletal: 1-2+ lower extreme edema bilaterally. Neurologic: Normal speech and language.  Mild confusion. Skin:  Skin is warm, dry and intact.  Psychiatric: Mood and affect are normal.   ____________________________________________    EKG  EKG viewed and interpreted by myself shows atrial fibrillation with rapid ventricular response at 126 bpm with a narrow QRS, left axis deviation, largely normal intervals with nonspecific ST changes.  No ST elevation.  ____________________________________________    RADIOLOGY  Chest x-ray shows bilateral interstitial thickening and atelectasis with cardiomegaly. CT scan head is negative for acute abnormality.  ____________________________________________   INITIAL IMPRESSION / ASSESSMENT AND PLAN / ED COURSE  Pertinent labs & imaging results that were available during my care of the patient were reviewed by me and considered in my medical decision making (see chart for details).   Patient presents emergency department for confusion and shortness of breath.  Patient found to be hypoxic on her normal 3 L currently satting around 90% on 6 L.  Patient has lower extremity edema and somewhat diminished breath sounds with mild tachypnea.  Patient is in atrial fibrillation with rapid ventricular response.  Patient has a history  of atrial fibrillation at baseline.  We will dose 10 mg of IV diltiazem.  We will obtain a chest x-ray, lab work including cardiac enzymes and BNP and continue to  closely monitor.  Given the patient's increased oxygen requirement and shortness of breath anticipate admission to the hospital once the patient's emergency department work-up is been completed.  Lab work is finally resulted.  We will start the patient on IV Lasix given her hypoxia chest x-ray findings and increased lower extremity edema.  I discussed plan of care with the daughter who is agreeable.  [name] was evaluated in Emergency Department on 06/17/2021 for the symptoms described in the history of present illness. She was evaluated in the context of the global COVID-19 pandemic, which necessitated consideration that the patient might be at risk for infection with the SARS-CoV-2 virus that causes COVID-19. Institutional protocols and algorithms that pertain to the evaluation of patients at risk for COVID-19 are in a state of rapid change based on information released by regulatory bodies including the CDC and federal and state organizations. These policies and algorithms were followed during the patient's care in the ED.  ____________________________________________   FINAL CLINICAL IMPRESSION(S) / ED DIAGNOSES  Confusion Dyspnea Hypoxia CHF exacerbation   Harvest Dark, MD 06/17/21 2250

## 2021-06-18 ENCOUNTER — Inpatient Hospital Stay (HOSPITAL_COMMUNITY)
Admit: 2021-06-18 | Discharge: 2021-06-18 | Disposition: A | Discharge status: Home / Self Care | Payer: Medicare Other | Attending: Internal Medicine | Admitting: Internal Medicine

## 2021-06-18 DIAGNOSIS — I4819 Other persistent atrial fibrillation: Secondary | ICD-10-CM | POA: Diagnosis not present

## 2021-06-18 DIAGNOSIS — I5022 Chronic systolic (congestive) heart failure: Secondary | ICD-10-CM

## 2021-06-18 DIAGNOSIS — R4182 Altered mental status, unspecified: Secondary | ICD-10-CM | POA: Diagnosis not present

## 2021-06-18 DIAGNOSIS — I5021 Acute systolic (congestive) heart failure: Secondary | ICD-10-CM

## 2021-06-18 DIAGNOSIS — J9621 Acute and chronic respiratory failure with hypoxia: Secondary | ICD-10-CM | POA: Diagnosis not present

## 2021-06-18 LAB — ECHOCARDIOGRAM COMPLETE
AR max vel: 2.26 cm2
AV Area VTI: 2.72 cm2
AV Area mean vel: 2.15 cm2
AV Mean grad: 2 mmHg
AV Peak grad: 4 mmHg
Ao pk vel: 1 m/s
Area-P 1/2: 6.12 cm2
Height: 66 in
MV VTI: 2.91 cm2
S' Lateral: 3.4 cm
Weight: 3291.03 oz

## 2021-06-18 LAB — BASIC METABOLIC PANEL
Anion gap: 7 (ref 5–15)
BUN: 20 mg/dL (ref 8–23)
CO2: 27 mmol/L (ref 22–32)
Calcium: 9 mg/dL (ref 8.9–10.3)
Chloride: 102 mmol/L (ref 98–111)
Creatinine, Ser: 1.02 mg/dL — ABNORMAL HIGH (ref 0.44–1.00)
GFR, Estimated: 55 mL/min — ABNORMAL LOW (ref >60)
Glucose, Bld: 113 mg/dL — ABNORMAL HIGH (ref 70–99)
Potassium: 4.1 mmol/L (ref 3.5–5.1)
Sodium: 136 mmol/L (ref 135–145)

## 2021-06-18 LAB — CBC
HCT: 47.1 % — ABNORMAL HIGH (ref 36.0–46.0)
Hemoglobin: 16 g/dL — ABNORMAL HIGH (ref 12.0–15.0)
MCH: 34.6 pg — ABNORMAL HIGH (ref 26.0–34.0)
MCHC: 34 g/dL (ref 30.0–36.0)
MCV: 101.7 fL — ABNORMAL HIGH (ref 80.0–100.0)
Platelets: 144 10*3/uL — ABNORMAL LOW (ref 150–400)
RBC: 4.63 MIL/uL (ref 3.87–5.11)
RDW: 13.6 % (ref 11.5–15.5)
WBC: 7 10*3/uL (ref 4.0–10.5)
nRBC: 0 % (ref 0.0–0.2)

## 2021-06-18 LAB — URINALYSIS, ROUTINE W REFLEX MICROSCOPIC
Bilirubin Urine: NEGATIVE
Glucose, UA: NEGATIVE mg/dL
Leukocytes,Ua: NEGATIVE
Nitrite: NEGATIVE
Protein, ur: NEGATIVE mg/dL
Specific Gravity, Urine: 1.02 (ref 1.005–1.030)
pH: 5.5 (ref 5.0–8.0)

## 2021-06-18 LAB — URINALYSIS, MICROSCOPIC (REFLEX): Bacteria, UA: NONE SEEN

## 2021-06-18 LAB — TROPONIN I (HIGH SENSITIVITY)
Troponin I (High Sensitivity): 41 ng/L — ABNORMAL HIGH (ref <18)
Troponin I (High Sensitivity): 42 ng/L — ABNORMAL HIGH (ref <18)
Troponin I (High Sensitivity): 31 ng/L — ABNORMAL HIGH (ref <18)

## 2021-06-18 LAB — DIGOXIN LEVEL: Digoxin Level: 0.7 ng/mL — ABNORMAL LOW (ref 0.8–2.0)

## 2021-06-18 MED ORDER — SODIUM CHLORIDE 0.9 % IV SOLN: INTRAVENOUS | Status: DC

## 2021-06-18 MED ORDER — LEVALBUTEROL HCL 1.25 MG/0.5ML IN NEBU
1.2500 mg | INHALATION_SOLUTION | Freq: Four times a day (QID) | RESPIRATORY_TRACT | Status: DC | PRN
  Filled 2021-06-18: qty 0.5

## 2021-06-18 MED ORDER — METOPROLOL TARTRATE 25 MG PO TABS
12.5000 mg | ORAL_TABLET | Freq: Four times a day (QID) | ORAL | Status: DC
  Administered 2021-06-18 – 2021-06-19 (×3): 12.5 mg via ORAL
  Filled 2021-06-18 (×3): qty 1

## 2021-06-18 NOTE — Consult Note (Signed)
Cardiology Consultation:   Patient ID: [name]; [id number]; [birth year]   Admit date: 06/17/2021 Date of Consult: 06/18/2021  Primary Care Provider: Burnard Bunting, MD Primary Cardiologist: [name] Primary Electrophysiologist:  None   Patient Profile:   [name] is a 80 y.o. female with a hx of persistent A. fib on apixaban, chronic combined systolic and diastolic CHF, chronic hypoxic respiratory failure on supplemental oxygen at 3 L via nasal cannula, COPD, pulmonary hypertension, DVT, non-small cell lung cancer status post chemoradiation, AAA status post aortic stent graft followed by vascular surgery, descending thoracic aortic aneurysm, HTN, HLD, hypothyroidism, osteoarthritis, OSA, who is being seen today for the evaluation of A. fib with RVR at the request of [name].  History of Present Illness:   [name] was initially diagnosed with A. fib and cardiomyopathy in 08/2016.  Echo at that time demonstrated an EF of 35 to 40%.  Prior remote normal nuclear stress test.    She was last seen in the office in 02/2021 noting progressive dyspnea.  O2 saturations 89% at that time.  She underwent echo in 02/2021 which showed a stable cardiomyopathy with an EF of 35 to 40%, global hypokinesis, normal RV systolic function with mildly enlarged RV cavity size, moderately elevated PASP estimated at 45.2 mmHg, severe biatrial enlargement, moderate mitral gravitation, moderate tricuspid regurgitation, and an estimated right atrial pressure of 15 mmHg.  She was admitted to the hospital in 04/2021 with altered mental status and generalized weakness and found to have severe sepsis secondary to UTI with admission complicated by acute metabolic encephalopathy secondary to infection, acute on chronic hypoxic respiratory failure, and A. fib with RVR.  She presented to [facility] on 12/26 with confusion, shortness of breath, and a possible unwitnessed fall.  In the ED, she was unclear as to why  she was brought to the ED.  Chest x-ray showed no interval change in the previously noted persistent bilateral interstitial thickening with superimposed atelectasis and stable cardiomegaly.  CT head showed no definite evidence for acute intracranial abnormality along with a small age indeterminate hypodensity in the right thalamus possibly reflecting a lacunar infarct or small vessel disease as well as extensive white matter hypodensity consistent with chronic small vessel ischemic changes that had progressed when compared to prior imaging.  High-sensitivity troponin of 35 with a delta of 41, currently trended to 42.  BNP 363 which is consistent with prior readings dating back to at least 2018.  Digoxin 0.7.  Potassium 4.1, BUN 20, serum creatinine 1.02.  WBC 7.0, Hgb 16.0, PLT 144.  CK 909.  COVID and influenza negative.  She did require increased supplemental oxygen at 6 L via nasal cannula, with a baseline of 3 L.  In the ED, she has received albuterol, IV Lasix 60 mg, and IV Cardizem 10 mg.  Upon admission, she was placed on IV Lasix 40 mg twice daily currently, as well as Lopressor 12.5 mg twice daily, and PTA apixaban.  She remains in A. fib with RVR with ventricular rates ranging from the low 100s to 140s bpm.  She denies any chest pain, palpitations, dizziness, presyncope, or syncope.  She reports she is chronically short of breath.  BP remains elevated in the 062B to 762G systolic.    Past Medical History:  Diagnosis Date   AAA (abdominal aortic aneurysm)    Anticoagulant long-term use    Failed on Coumadin. On Xarelto   Anxiety    Anxiety and depression  Atrial fib/flutter, transient June 2012   Chronic lower back pain    COPD (chronic obstructive pulmonary disease) (HCC)    DVT (deep venous thrombosis) ([place]) 2004   BLE   Esophageal dysmotility    Exertional dyspnea    Hiatal hernia    History of bronchitis    History of fibrocystic disease of breast    History of uterine fibroid     HTN (hypertension)    Hyperlipidemia    Hypothyroidism    Normal nuclear stress test 2012   May 2012   OA (osteoarthritis)    "knees; left shoulder"   OSA (obstructive sleep apnea)    "haven't been using my CPAP lately" (01/21/12)   Ovarian mass    right benign   Ovarian mass    benign, right   PAD (peripheral artery disease) ([place])    Small cell carcinoma of lung ([place]) 2004   NON-SMALL CELL CARCINOMA OF THE LUNG, METASTATIC TO THE SUPRACLAVICULAR AND MEDIASTINAL LYMPH NODES; in remission    Past Surgical History:  Procedure Laterality Date   ABDOMINAL AORTIC ANEURYSM REPAIR  ~ 2010   stent graft   BLADDER SURGERY  ~ 2003   sling   BREAST BIOPSY  1960's   both breast's - benign   CARDIOVASCULAR STRESS TEST  2012   No ischemia   CATARACT EXTRACTION W/ INTRAOCULAR LENS  IMPLANT, BILATERAL Bilateral ~ 2009   REPLACEMENT TOTAL KNEE Left ~ 2008   left   THYROIDECTOMY  ~ [place]: Prior to Admission medications   Medication Sig Start Date End Date Taking? Authorizing Provider  apixaban (ELIQUIS) 5 MG TABS tablet Take 1 tablet (5 mg total) by mouth 2 (two) times daily. 06/27/20  Yes [name], Peter M, MD  Cholecalciferol (VITAMIN D) 1000 UNITS capsule Take 1,000 Units by mouth daily.   Yes [provider]  citalopram (CELEXA) 20 MG tablet Take 20 mg by mouth daily.   Yes [provider]  digoxin (LANOXIN) 0.125 MG tablet Take 1 tablet (125 mcg total) by mouth daily. 12/21/20  Yes [name], Peter M, MD  furosemide (LASIX) 40 MG tablet Take 2 tablets ( 80 mg ) daily 12/21/20  Yes [name], Peter M, MD  gabapentin (NEURONTIN) 100 MG capsule Take 1 capsule by mouth 3 (three) times daily.   Yes [provider]  guaiFENesin (MUCINEX) 600 MG 12 hr tablet Take 1,200 mg by mouth 2 (two) times daily.   Yes [provider]  levothyroxine (SYNTHROID, LEVOTHROID) 88 MCG tablet Take 88 mcg by mouth daily.   Yes [provider]  megestrol (MEGACE) 40 MG  tablet Take 40 mg by mouth 2 (two) times daily.   Yes [provider]  metoprolol tartrate (LOPRESSOR) 25 MG tablet TAKE 1/2 TABLET TWICE DAILY Patient taking differently: Take 12.5 mg by mouth 2 (two) times daily. 11/08/20  Yes [name], Peter M, MD  morphine (MSIR) 15 MG tablet Take 5 mg by mouth as needed. 10/13/16  Yes [provider]  omeprazole (PRILOSEC) 40 MG capsule Take 40 mg by mouth daily.   Yes [provider]  potassium chloride (KLOR-CON) 10 MEQ tablet Take 1 tablet (10 mEq total) by mouth 2 (two) times daily. 12/21/20  Yes [name], Peter M, MD  tiotropium (SPIRIVA HANDIHALER) 18 MCG inhalation capsule Place 1 capsule (18 mcg total) into inhaler and inhale daily. 09/10/20  Yes Young, Tarri Fuller D, MD  vitamin B-12 (CYANOCOBALAMIN) 100 MCG tablet Take 100  mcg by mouth daily.   Yes [provider]  acetaminophen (TYLENOL) 500 MG tablet Take 2 tablets (1,000 mg total) by mouth every 6 (six) hours as needed for fever, headache, mild pain or moderate pain (backpain). 05/09/21   Sidney Ace, MD  albuterol (VENTOLIN HFA) 108 (90 Base) MCG/ACT inhaler TAKE 2 PUFFS EVERY 6 HOURS AS NEEDED FORSHORTNESS OF BREATH/WHEEZING 09/10/20   Baird Lyons D, MD  metolazone (ZAROXOLYN) 2.5 MG tablet Take 1 tablet (2.5 mg total) by mouth daily as needed (ONE DAILY PRN WEIGHT GAIN). USE AS NEEDED FOR WEIGHT GAIN 09/06/19   Deberah Pelton, NP  mupirocin ointment (BACTROBAN) 2 % mupirocin 2 % topical ointment  APPLY TO WOUNDS ON SKIN ONCE DAILY AT BANDAGE CHANGES Patient not taking: Reported on 06/17/2021    [provider]  OXYGEN Inhale 4 L into the lungs daily. Patient states she wears 2L at night and 4L when up and moving    [provider]  pantoprazole (PROTONIX) 40 MG tablet Take 1 tablet (40 mg total) by mouth daily. 05/10/21 06/09/21  Sidney Ace, MD  temazepam (RESTORIL) 15 MG capsule TAKE 1 CAPSULE AT BEDTIME AS NEEDED  FOR  SLEEP 07/16/16    Deneise Lever, MD    Inpatient Medications: Scheduled Meds:  albuterol  2.5 mg Inhalation Q4H   apixaban  5 mg Oral BID   citalopram  20 mg Oral Daily   docusate sodium  100 mg Oral BID   furosemide  40 mg Intravenous BID   gabapentin  100 mg Oral TID   levothyroxine  88 mcg Oral Daily   metoprolol tartrate  12.5 mg Oral Q6H   nicotine  14 mg Transdermal Daily   sodium chloride flush  3 mL Intravenous Q12H   tiotropium  18 mcg Inhalation Daily   Continuous Infusions:  PRN Meds: acetaminophen **OR** acetaminophen, bisacodyl, hydrALAZINE, ondansetron **OR** ondansetron (ZOFRAN) IV, oxyCODONE, polyethylene glycol, traZODone  Allergies:  No Known Allergies  Social History:   Social History   Socioeconomic History   Marital status: Divorced    Spouse name: Not on file   Number of children: 2   Years of education: Not on file   Highest education level: Not on file  Occupational History   Occupation: retired    Comment: Chiropractor  Tobacco Use   Smoking status: Former    Packs/day: 1.00    Years: 40.00    Pack years: 40.00    Types: Cigarettes    Quit date: 06/23/2001    Years since quitting: 20.0   Smokeless tobacco: Never  Vaping Use   Vaping Use: Never used  Substance and Sexual Activity   Alcohol use: Yes    Alcohol/week: 0.0 standard drinks    Comment: once a week (glass of wine)   Drug use: No   Sexual activity: Never  Other Topics Concern   Not on file  Social History Narrative   Not on file   Social Determinants of Health   Financial Resource Strain: Not on file  Food Insecurity: Not on file  Transportation Needs: Not on file  Physical Activity: Not on file  Stress: Not on file  Social Connections: Not on file  Intimate Partner Violence: Not on file     Family History:   Family History  Problem Relation Age of Onset   Heart disease Father    Heart attack Father    Diabetes type II Mother    Diabetes  Mother    Hypertension Mother     Heart disease Brother        before age 98   Heart disease Sister        See's Dr. Acie Fredrickson   Breast cancer Neg Hx     ROS:  Review of Systems  Constitutional:  Positive for malaise/fatigue. Negative for chills, diaphoresis, fever and weight loss.  HENT:  Negative for congestion.   Eyes:  Negative for discharge and redness.  Respiratory:  Positive for shortness of breath. Negative for cough, sputum production and wheezing.   Cardiovascular:  Negative for chest pain, palpitations, orthopnea, claudication, leg swelling and PND.  Gastrointestinal:  Negative for abdominal pain, blood in stool, heartburn, melena, nausea and vomiting.  Musculoskeletal:  Positive for falls. Negative for myalgias.  Skin:  Negative for rash.  Neurological:  Positive for weakness. Negative for dizziness, tingling, tremors, sensory change, speech change, focal weakness and loss of consciousness.  Endo/Heme/Allergies:  Does not bruise/bleed easily.  Psychiatric/Behavioral:  Negative for substance abuse. The patient is not nervous/anxious.   All other systems reviewed and are negative.    Physical Exam/Data:   Vitals:   06/18/21 0421 06/18/21 0721 06/18/21 0900 06/18/21 0923  BP:  122/88 (!) 146/95 (!) 146/95  Pulse: (!) 138 (!) 110 62 (!) 141  Resp: (!) 26 (!) 21 (!) 24   Temp:      TempSrc:      SpO2: 94% 92% 93%   Weight:      Height:        Intake/Output Summary (Last 24 hours) at 06/18/2021 1115 Last data filed at 06/18/2021 0920 Gross per 24 hour  Intake --  Output 3200 ml  Net -3200 ml   Filed Weights   06/17/21 1440  Weight: 93.3 kg   Body mass index is 33.2 kg/m.   Physical Exam: General: Well developed, well nourished, in no acute distress. Head: Normocephalic, atraumatic, sclera non-icteric, no xanthomas, nares without discharge.  Neck: Negative for carotid bruits. JVD not elevated. Lungs: Clear bilaterally to auscultation without wheezes, rales, or rhonchi. Breathing is  unlabored. Heart: Tachycardic, irregularly irregular with S1 S2. II/VI systolic murmur LSB, no rubs, or gallops appreciated. Abdomen: Soft, non-tender, non-distended with normoactive bowel sounds. No hepatomegaly. No rebound/guarding. No obvious abdominal masses. Msk:  Strength and tone appear normal for age. Extremities: No clubbing or cyanosis. No edema. Distal pedal pulses are 2+ and equal bilaterally. Neuro: Alert and oriented X 3. No facial asymmetry. No focal deficit. Moves all extremities spontaneously. Psych:  Responds to questions appropriately with a normal affect.   EKG:  The EKG was personally reviewed and demonstrates: A. fib with RVR, 126 bpm, left anterior fascicular block, poor R wave progression along the precordial leads, nonspecific anterolateral ST-T changes consistent with prior tracing Telemetry:  Telemetry was personally reviewed and demonstrates: A. fib with ventricular rates ranging from the low 100s to 140s bpm  Weights: Filed Weights   06/17/21 1440  Weight: 93.3 kg    Relevant CV Studies:  2D echo 03/21/2021: 1. Left ventricular ejection fraction, by estimation, is 35 to 40%. The  left ventricle has moderately decreased function. The left ventricle  demonstrates global hypokinesis. Left ventricular diastolic parameters are  indeterminate.   2. Right ventricular systolic function is normal. The right ventricular  size is mildly enlarged. There is moderately elevated pulmonary artery  systolic pressure. The estimated right ventricular systolic pressure is  95.2 mmHg.   3. Left atrial size  was severely dilated.   4. Right atrial size was severely dilated.   5. The mitral valve is normal in structure. Moderate mitral valve  regurgitation.   6. Tricuspid valve regurgitation is moderate.   7. The inferior vena cava is dilated in size with <50% respiratory  variability, suggesting right atrial pressure of 15 mmHg.   Comparison(s): LVEF 35-40%, Moderate  TR. __________  2D echo 09/17/2016: - Left ventricle: The cavity size was normal. Wall thickness was    increased in a pattern of moderate LVH. Systolic function was    moderately reduced. The estimated ejection fraction was in the    range of 35% to 40%. Diffuse hypokinesis. The study is not    technically sufficient to allow evaluation of LV diastolic    function.  - Aortic valve: Trileaflet. Sclerosis without stenosis. There was    no regurgitation.  - Mitral valve: Calcified annulus. Mildly thickened leaflets .  - Left atrium: The atrium was mildly dilated.  - Right ventricle: The cavity size was normal. Mildly decreased RV    systolic function.  - Right atrium: Moderately dilated.  - Tricuspid valve: There was moderate regurgitation.  - Pulmonary arteries: PA peak pressure: 49 mm Hg (S).  - Inferior vena cava: The vessel was dilated. The respirophasic    diameter changes were blunted (< 50%), consistent with elevated    central venous pressure.   Impressions:   - Compared to a prior study in 2016, the LVEF is reduced now to    35-40%. A-fib wtih RVR is noted. The ascending aorta meausres 4.0    cm. There is mild AI, mild LAE, moderate RAE, moderate TR and    RVSP of 49 mmHg with a dilated IVC. __________  2D echo 04/23/2015: - Left ventricle: The cavity size was normal. Wall thickness was    increased in a pattern of mild LVH. Systolic function was normal.    The estimated ejection fraction was in the range of 55% to 60%.    Wall motion was normal; there were no regional wall motion    abnormalities. Doppler parameters are consistent with    pseudonormal left ventricular relaxation (grade 2 diastolic    dysfunction). The E/e&' ratio is >15, suggesting elevated LV    filling pressure.  - Mitral valve: Calcified annulus.  - Left atrium: The atrium was normal in size.  - Tricuspid valve: There was mild regurgitation.  - Pulmonary arteries: PA peak pressure: 32 mm Hg (S).   - Inferior vena cava: The vessel was dilated. The respirophasic    diameter changes were blunted (< 50%), consistent with elevated    central venous pressure.   Impressions:   - Compared to the prior study in 01/2012, there are few changes. The    RVSP is lower at 32 mmHg, down from 55 mmHg. __________  2D echo 01/22/2012: - Left ventricle: The cavity size was normal. Wall thickness    was normal. Systolic function was normal. The estimated    ejection fraction was in the range of 55% to 60%. Features    are consistent with a pseudonormal left ventricular    filling pattern, with concomitant abnormal relaxation and    increased filling pressure (grade 2 diastolic    dysfunction).  - Mitral valve: Mild regurgitation.  - Left atrium: The atrium was moderately dilated.  - Pulmonary arteries: PA peak pressure: 84mm Hg (S).    Laboratory Data:  Chemistry Recent Labs  Lab 06/17/21  1802 06/18/21 0328  NA 137 136  K 4.8 4.1  CL 105 102  CO2 24 27  GLUCOSE 113* 113*  BUN 22 20  CREATININE 0.97 1.02*  CALCIUM 9.6 9.0  GFRNONAA 58* 55*  ANIONGAP 8 7    Recent Labs  Lab 06/17/21 1802  PROT 7.1  ALBUMIN 3.6  AST 36  ALT 16  ALKPHOS 48  BILITOT 1.5*   Hematology Recent Labs  Lab 06/17/21 1802 06/18/21 0328  WBC 8.5 7.0  RBC 4.50 4.63  HGB 15.2* 16.0*  HCT 46.4* 47.1*  MCV 103.1* 101.7*  MCH 33.8 34.6*  MCHC 32.8 34.0  RDW 13.6 13.6  PLT 145* 144*   Cardiac EnzymesNo results for input(s): TROPONINI in the last 168 hours. No results for input(s): TROPIPOC in the last 168 hours.  BNP Recent Labs  Lab 06/17/21 1802  BNP 363.5*    DDimer No results for input(s): DDIMER in the last 168 hours.  Radiology/Studies:  CT HEAD WO CONTRAST (5MM)  Result Date: 06/17/2021 IMPRESSION: 1. No definite CT evidence for acute intracranial abnormality. 2. Small age indeterminate hypodensity in the right thalamus which may reflect lacunar infarct or small vessel disease.  Extensive white matter hypodensity consistent with chronic small vessel ischemic change, progressed compared to prior head CT. Electronically Signed   By: Donavan Foil M.D.   On: 06/17/2021 19:18   DG Chest Portable 1 View  Result Date: 06/17/2021 IMPRESSION: 1. No significant interval change in the persistent bilateral interstitial thickening with superimposed atelectasis. 2. Stable cardiomegaly. Electronically Signed   By: Dahlia Bailiff M.D.   On: 06/17/2021 16:10    Assessment and Plan:   1.  Persistent A. fib with RVR: -She remains in A. fib with RVR with ventricular rates in the low 100s to 140s bpm -Last documented to be in sinus rhythm on 07/21/2019 -Increase Lopressor to 25 mg every 6 hours, may need to escalate further -PTA digoxin with current digoxin level 0.7 -Consider transitioning albuterol to Xopenex, will defer to primary service -CHA2DS2-VASc at least 6 (CHF, HTN, age x2, vascular disease, sex category) -PTA apixaban -Not a good candidate for amiodarone given severe lung disease; Tikosyn/sotalol have been contraindicated due to prolonged QT; Multaq and flecainide not ideal choices with CHF  2.  Chronic combined systolic and diastolic CHF: -Appears largely euvolemic -Documented weight in the ED is up almost 2 kg when compared to her visit in 02/2021 -Recent echo in 02/2021 demonstrated a stable cardiomyopathy -Primary service has ordered a repeat echo, await results  3.  Acute on chronic hypoxic respiratory failure/NSC lung cancer: -She continues to require increased supplemental oxygen -Her chronic dyspnea has previously been felt to be related to underlying pulmonary disease including prior lung cancer, COPD, and pulmonary hypertension -She does not appear grossly volume overloaded on exam, with chest x-ray, and has a BNP that is consistent with her prior readings -PE is less likely given that she has been chronically anticoagulated with apixaban, and less she has been  missing doses -May need to discuss ischemic testing pending echo results given her progressive dyspnea  4.  Rhabdomyolysis: -Gentle hydration -Management per primary service  5.  AAA/saccular descending thoracic aortic aneurysm: -Stable on most recent imaging -Followed by vascular surgery    For questions or updates, please contact [facility] Please consult [email or web address] for contact info under Cardiology/STEMI.   Signed, Christell Faith, PA-C [place] Pager: [phone] 06/18/2021, 11:15 AM

## 2021-06-18 NOTE — ED Notes (Signed)
Pt resting with eyes closed, NAD. VSS. Call light within reach. Will continue to monitor.

## 2021-06-18 NOTE — Progress Notes (Addendum)
PT Cancellation Note  Patient Details Name: Greenly Rarick MRN: [id number] DOB: Nov 09, 1940   Cancelled Treatment:    Reason Eval/Treat Not Completed: Patient not medically ready.  PT orders received and therapist performed chart review. Pt noted to have elevated resting RR and HR this AM (130-140 bpm), with current resting HR fluctuating between 100-130bpm while sleeping in bed. PT intervention not appropriate at this time (exertional activity contraindicated 2/2 elevated resting HR).  Communication between PT, OT, and PA via secure chat was made, with PA requesting hold until rates were under control.  Will f/u as pt is medically stable.    Gwenlyn Saran, PT, DPT 06/18/21, 12:19 PM

## 2021-06-18 NOTE — ED Notes (Signed)
Pt was complaining of leg pain, gave medication see MAR for details. Will continue to monitor for changes. Call light within reach. Will continue to monitor.

## 2021-06-18 NOTE — Progress Notes (Signed)
*  PRELIMINARY RESULTS* Echocardiogram 2D Echocardiogram has been performed.  [name] 06/18/2021, 11:26 AM

## 2021-06-18 NOTE — Progress Notes (Addendum)
OT Cancellation Note  Patient Details Name: [name] MRN: [id number] DOB: Sep 01, 1940   Cancelled Treatment:    Reason Eval/Treat Not Completed: Patient not medically ready. OT orders received, chart reviewed. Pt noted to have elevated resting RR and HR this AM (130-140 bpm), with current resting HR fluctuating between 100-130bpm while sleeping in bed. OT intervention not appropriate at this time (exertional activity contraindicated 2/2 elevated resting HR).   Addendum: Cardiology contacted via secure chat and currently requesting to hold OT/PT until HR better controlled. Will f/u as pt is medically stable.   Fredirick Maudlin, OTR/L [place]

## 2021-06-18 NOTE — ED Notes (Signed)
Pt sitting up in bed eating her breakfast. Call light within reach. Will continue to monitor for changes.

## 2021-06-18 NOTE — Progress Notes (Signed)
[facility] [place] NAME: [name]    MR#:  [id number]  DATE OF BIRTH:  1940/12/09  SUBJECTIVE:  patient came in with increasing shortness of breath and was found to be in rapid a fib with RVR. She had fallen at her assisted living. Denies any pain at present. No family at bedside. Complains of bilateral thigh pain. She tells me she got pain meds at present feeling better.  Heart rate remains 100- 130s.  REVIEW OF SYSTEMS:   Review of Systems  Constitutional:  Negative for chills, fever and weight loss.  HENT:  Negative for ear discharge, ear pain and nosebleeds.   Eyes:  Negative for blurred vision, pain and discharge.  Respiratory:  Positive for shortness of breath. Negative for sputum production, wheezing and stridor.   Cardiovascular:  Negative for chest pain, palpitations, orthopnea and PND.  Gastrointestinal:  Negative for abdominal pain, diarrhea, nausea and vomiting.  Genitourinary:  Negative for frequency and urgency.  Musculoskeletal:  Positive for falls and joint pain. Negative for back pain.  Neurological:  Positive for weakness. Negative for sensory change, speech change and focal weakness.  Psychiatric/Behavioral:  Negative for depression and hallucinations. The patient is not nervous/anxious.   Tolerating Diet:yes Tolerating PT: pending due to increased HR  DRUG ALLERGIES:  No Known Allergies  VITALS:  Blood pressure 109/87, pulse 84, temperature 98.7 F (37.1 C), temperature source Axillary, resp. rate (!) 22, height 5\' 6"  (1.676 m), weight 93.3 kg, SpO2 96 %.  PHYSICAL EXAMINATION:   Physical Exam  GENERAL:  80 y.o.-year-old patient lying in the bed with no acute distress. Appears chronically ill HEENT: Head atraumatic, normocephalic. Oropharynx and nasopharynx clear.  LUNGS: decreased breath sounds bilaterally, no wheezing, rales, rhonchi. No use of accessory muscles of respiration.  CARDIOVASCULAR: S1, S2  normal. No murmurs, rubs, or gallops. Tachycardia+ ABDOMEN: Soft, nontender, nondistended. Bowel sounds present.EXTREMITIES: No cyanosis, clubbing or edema b/l.    NEUROLOGIC: nonfocal PSYCHIATRIC:  patient is alert and oriented x 3.  SKIN: No obvious rash, lesion, or ulcer.   LABORATORY PANEL:  CBC Recent Labs  Lab 06/18/21 0328  WBC 7.0  HGB 16.0*  HCT 47.1*  PLT 144*    Chemistries  Recent Labs  Lab 06/17/21 1802 06/18/21 0328  NA 137 136  K 4.8 4.1  CL 105 102  CO2 24 27  GLUCOSE 113* 113*  BUN 22 20  CREATININE 0.97 1.02*  CALCIUM 9.6 9.0  AST 36  --   ALT 16  --   ALKPHOS 48  --   BILITOT 1.5*  --    Cardiac Enzymes No results for input(s): TROPONINI in the last 168 hours. RADIOLOGY:  CT HEAD WO CONTRAST (5MM)  Result Date: 06/17/2021 CLINICAL DATA:  Head trauma unwitnessed fall EXAM: CT HEAD WITHOUT CONTRAST TECHNIQUE: Contiguous axial images were obtained from the base of the skull through the vertex without intravenous contrast. COMPARISON:  CT brain 07/05/2014 FINDINGS: Brain: No acute territorial infarction, hemorrhage or intracranial mass. Extensive white matter hypodensity progressed compared to prior. Age indeterminate hypodensity in the right thalamus. Normal ventricle size. Mild atrophy. Vascular: No hyperdense vessels.  Carotid vascular calcification Skull: Normal. Negative for fracture or focal lesion. Sinuses/Orbits: Opacified left maxillary sinus. Other: None IMPRESSION: 1. No definite CT evidence for acute intracranial abnormality. 2. Small age indeterminate hypodensity in the right thalamus which may reflect lacunar infarct or small vessel disease. Extensive white matter hypodensity consistent with  chronic small vessel ischemic change, progressed compared to prior head CT. Electronically Signed   By: Donavan Foil M.D.   On: 06/17/2021 19:18   DG Chest Portable 1 View  Result Date: 06/17/2021 CLINICAL DATA:  Hypoxia EXAM: PORTABLE CHEST 1 VIEW  COMPARISON:  May 05, 2021 FINDINGS: Enlarged cardiac silhouette, similar prior. Low lung volumes with bibasilar atelectasis. No significant interval change in the persistent bilateral interstitial thickening. No new focal consolidation. No visible pleural effusion or pneumothorax. The visualized skeletal structures are unchanged. IMPRESSION: 1. No significant interval change in the persistent bilateral interstitial thickening with superimposed atelectasis. 2. Stable cardiomegaly. Electronically Signed   By: Dahlia Bailiff M.D.   On: 06/17/2021 16:10   ECHOCARDIOGRAM COMPLETE  Result Date: 06/18/2021    ECHOCARDIOGRAM REPORT   Patient Name:   [name] Date of Exam: 06/18/2021 Medical Rec #:  [id number]        Height:       66.0 in Accession #:    [id number]       Weight:       205.7 lb Date of Birth:  [birth year]        BSA:          2.024 m Patient Age:    43 years         BP:           146/95 mmHg Patient Gender: F                HR:           141 bpm. Exam Location:  [facility] Procedure: 2D Echo, Cardiac Doppler and Color Doppler Indications:     CHF-acute systolic 976.73 / A19.37  History:         Patient has prior history of Echocardiogram examinations, most                  recent 03/21/2021. COPD, Arrythmias:Atrial Fibrillation and                  Atrial Flutter; Risk Factors:Hypertension and Dyslipidemia.  Sonographer:     Sherrie Sport Referring Phys:  [id number] Gretta Cool Eurika Sandy Diagnosing Phys: Nelva Bush MD  Sonographer Comments: Suboptimal apical window. IMPRESSIONS  1. Left ventricular ejection fraction, by estimation, is 35 to 40%. The left ventricle has moderately decreased function. The left ventricle demonstrates global hypokinesis. There is mild left ventricular hypertrophy. Left ventricular diastolic function  could not be evaluated.  2. Right ventricular systolic function is moderately reduced. The right ventricular size is normal. There is moderately elevated pulmonary artery systolic  pressure.  3. Left atrial size was moderately dilated.  4. Right atrial size was moderately dilated.  5. The mitral valve is degenerative. Mild mitral valve regurgitation. No evidence of mitral stenosis. Severe mitral annular calcification.  6. Tricuspid valve regurgitation is moderate.  7. The aortic valve is tricuspid. There is mild thickening of the aortic valve. Aortic valve regurgitation is not visualized. Aortic valve sclerosis is present, with no evidence of aortic valve stenosis.  8. The inferior vena cava is normal in size with greater than 50% respiratory variability, suggesting right atrial pressure of 3 mmHg. FINDINGS  Left Ventricle: Left ventricular ejection fraction, by estimation, is 35 to 40%. The left ventricle has moderately decreased function. The left ventricle demonstrates global hypokinesis. The left ventricular internal cavity size was normal in size. There is mild left ventricular hypertrophy. Left ventricular diastolic function could not be evaluated due  to atrial fibrillation. Left ventricular diastolic function could not be evaluated. Right Ventricle: The right ventricular size is normal. No increase in right ventricular wall thickness. Right ventricular systolic function is moderately reduced. There is moderately elevated pulmonary artery systolic pressure. The tricuspid regurgitant velocity is 3.35 m/s, and with an assumed right atrial pressure of 3 mmHg, the estimated right ventricular systolic pressure is 09.6 mmHg. Left Atrium: Left atrial size was moderately dilated. Right Atrium: Right atrial size was moderately dilated. Pericardium: Trivial pericardial effusion is present. Mitral Valve: The mitral valve is degenerative in appearance. There is mild thickening of the mitral valve leaflet(s). Severe mitral annular calcification. Mild mitral valve regurgitation. No evidence of mitral valve stenosis. MV peak gradient, 3.3 mmHg.  The mean mitral valve gradient is 1.0 mmHg. Tricuspid  Valve: The tricuspid valve is normal in structure. Tricuspid valve regurgitation is moderate. Aortic Valve: The aortic valve is tricuspid. There is mild thickening of the aortic valve. Aortic valve regurgitation is not visualized. Aortic valve sclerosis is present, with no evidence of aortic valve stenosis. Aortic valve mean gradient measures 2.0  mmHg. Aortic valve peak gradient measures 4.0 mmHg. Aortic valve area, by VTI measures 2.72 cm. Pulmonic Valve: The pulmonic valve was grossly normal. Pulmonic valve regurgitation is trivial. No evidence of pulmonic stenosis. Aorta: The aortic root is normal in size and structure. Venous: The inferior vena cava is normal in size with greater than 50% respiratory variability, suggesting right atrial pressure of 3 mmHg. IAS/Shunts: No atrial level shunt detected by color flow Doppler.  LEFT VENTRICLE PLAX 2D LVIDd:         4.20 cm LVIDs:         3.40 cm LV PW:         1.20 cm LV IVS:        1.10 cm LVOT diam:     2.00 cm LV SV:         32 LV SV Index:   16 LVOT Area:     3.14 cm  RIGHT VENTRICLE RV Basal diam:  3.50 cm RV S prime:     10.40 cm/s LEFT ATRIUM             Index        RIGHT ATRIUM           Index LA diam:        3.20 cm 1.58 cm/m   RA Area:     25.00 cm LA Vol (A2C):   99.1 ml 48.97 ml/m  RA Volume:   82.60 ml  40.81 ml/m LA Vol (A4C):   83.5 ml 41.26 ml/m LA Biplane Vol: 95.8 ml 47.34 ml/m  AORTIC VALVE                    PULMONIC VALVE AV Area (Vmax):    2.26 cm     PV Vmax:        0.61 m/s AV Area (Vmean):   2.15 cm     PV Vmean:       40.400 cm/s AV Area (VTI):     2.72 cm     PV VTI:         0.077 m AV Vmax:           100.00 cm/s  PV Peak grad:   1.5 mmHg AV Vmean:          61.200 cm/s  PV Mean grad:   1.0 mmHg AV VTI:  0.118 m      RVOT Peak grad: 3 mmHg AV Peak Grad:      4.0 mmHg AV Mean Grad:      2.0 mmHg LVOT Vmax:         71.80 cm/s LVOT Vmean:        41.800 cm/s LVOT VTI:          0.102 m LVOT/AV VTI ratio: 0.86  AORTA Ao Root  diam: 3.20 cm MITRAL VALVE                TRICUSPID VALVE MV Area (PHT): 6.12 cm     TR Peak grad:   44.9 mmHg MV Area VTI:   2.91 cm     TR Vmax:        335.00 cm/s MV Peak grad:  3.3 mmHg MV Mean grad:  1.0 mmHg     SHUNTS MV Vmax:       0.90 m/s     Systemic VTI:  0.10 m MV Vmean:      52.9 cm/s    Systemic Diam: 2.00 cm MV Decel Time: 124 msec     Pulmonic VTI:  0.101 m MV E velocity: 119.00 cm/s Nelva Bush MD Electronically signed by Nelva Bush MD Signature Date/Time: 06/18/2021/11:44:19 AM    Final    ASSESSMENT AND PLAN:  Meleane Selinger is a 80 y.o. female with a hx of persistent A. fib on apixaban, chronic combined systolic and diastolic CHF, chronic hypoxic respiratory failure on supplemental oxygen at 3 L via nasal cannula, COPD, pulmonary hypertension, DVT, non-small cell lung cancer status post chemoradiation, AAA status post aortic stent graft followed by vascular surgery, descending thoracic aortic aneurysm, HTN, HLD, hypothyroidism, osteoarthritis, OSA emergency room for evaluation of confusion. She had unwitnessed fall yesterday. Patient denies head injury. Uses chronically 3 L nasal cannula oxygen.  Acute on chronic hypoxic respiratory failure non-small cell lung cancer chronic respiratory failure with chronic oxygen -- continue PRN bronchodilators with xopenex -- wean oxygen down as tolerated  persistent a fib with RVR -- patient's Lopressor is been increased to 12.5 mg Q6 hourly -- resume digoxin -- continue eliquis -- cardiology consultation with [facility]. Patient is not deemed a good candidate for amiodarone giver severe lung disease. Not a candidate for Bobette Mo and sotalol due to prolonged QT  combined systolic diastolic heart failure -- appears well compensated -- echo from September 2022 demonstrate stable cardiomyopathy with EF 35-- 45% -- continue Lasix  AAA -- stable  Hypothyroidism -- continue Synthroid  generalized weakness, debility -- physical  therapy to see one's heart rate more stable  Procedures: Family communication : none today Consults : [facility] cardiology CODE STATUS: DNR DNI DVT Prophylaxis : eliquis Level of care: Telemetry Cardiac Status is: Inpatient  Remains inpatient appropriate because: Afib with RVR        TOTAL TIME TAKING CARE OF THIS PATIENT: 30 minutes.  >50% time spent on counselling and coordination of care  Note: This dictation was prepared with Dragon dictation along with smaller phrase technology. Any transcriptional errors that result from this process are unintentional.  Fritzi Mandes M.D    [facility]   CC: Primary care physician; Burnard Bunting, MD Patient ID: Romie Levee, female   DOB: Nov 30, 1940, 80 y.o.   MRN: [id number]

## 2021-06-18 NOTE — ED Notes (Signed)
Pt resting with eyes closed. Resp non-labored and equal. VSS. Call light within reach. Will continue to monitor for changes.

## 2021-06-19 DIAGNOSIS — J9621 Acute and chronic respiratory failure with hypoxia: Secondary | ICD-10-CM | POA: Diagnosis not present

## 2021-06-19 DIAGNOSIS — I5042 Chronic combined systolic (congestive) and diastolic (congestive) heart failure: Secondary | ICD-10-CM | POA: Diagnosis not present

## 2021-06-19 DIAGNOSIS — R410 Disorientation, unspecified: Secondary | ICD-10-CM

## 2021-06-19 DIAGNOSIS — E039 Hypothyroidism, unspecified: Secondary | ICD-10-CM

## 2021-06-19 DIAGNOSIS — I4891 Unspecified atrial fibrillation: Secondary | ICD-10-CM | POA: Diagnosis not present

## 2021-06-19 DIAGNOSIS — I10 Essential (primary) hypertension: Secondary | ICD-10-CM

## 2021-06-19 LAB — BASIC METABOLIC PANEL
Anion gap: 8 (ref 5–15)
BUN: 17 mg/dL (ref 8–23)
CO2: 31 mmol/L (ref 22–32)
Calcium: 9.1 mg/dL (ref 8.9–10.3)
Chloride: 98 mmol/L (ref 98–111)
Creatinine, Ser: 0.78 mg/dL (ref 0.44–1.00)
GFR, Estimated: >60 mL/min (ref >60)
Glucose, Bld: 113 mg/dL — ABNORMAL HIGH (ref 70–99)
Potassium: 4 mmol/L (ref 3.5–5.1)
Sodium: 137 mmol/L (ref 135–145)

## 2021-06-19 LAB — MRSA NEXT GEN BY PCR, NASAL: MRSA by PCR Next Gen: NOT DETECTED

## 2021-06-19 MED ORDER — METOPROLOL TARTRATE 25 MG PO TABS
25.0000 mg | ORAL_TABLET | Freq: Four times a day (QID) | ORAL | Status: DC
  Administered 2021-06-19: 11:00:00 25 mg via ORAL
  Filled 2021-06-19: qty 1

## 2021-06-19 MED ORDER — DIGOXIN 125 MCG PO TABS
0.1250 mg | ORAL_TABLET | Freq: Every day | ORAL | Status: DC
  Administered 2021-06-20 – 2021-06-21 (×2): 0.125 mg via ORAL
  Filled 2021-06-19 (×2): qty 1

## 2021-06-19 MED ORDER — DIGOXIN 250 MCG PO TABS
0.2500 mg | ORAL_TABLET | Freq: Once | ORAL | Status: AC
  Administered 2021-06-19: 21:00:00 0.25 mg via ORAL
  Filled 2021-06-19: qty 1

## 2021-06-19 MED ORDER — DIGOXIN 250 MCG PO TABS
0.2500 mg | ORAL_TABLET | ORAL | Status: DC
  Administered 2021-06-19 (×2): 0.25 mg via ORAL
  Filled 2021-06-19 (×3): qty 1

## 2021-06-19 MED ORDER — METOPROLOL TARTRATE 25 MG PO TABS
12.5000 mg | ORAL_TABLET | Freq: Four times a day (QID) | ORAL | Status: AC
  Administered 2021-06-19 – 2021-06-20 (×4): 12.5 mg via ORAL
  Filled 2021-06-19 (×4): qty 1

## 2021-06-19 NOTE — Progress Notes (Signed)
Patient HR continues to be elevated. Will administer scheduled and PRN meds and continue to monitor rate   06/19/21 0731  Assess: MEWS Score  Temp 98.3 F (36.8 C)  BP 100/73  Pulse Rate (!) 104  Resp 16  Level of Consciousness Alert  SpO2 93 %  O2 Device Nasal Cannula  Assess: MEWS Score  MEWS Temp 0  MEWS Systolic 1  MEWS Pulse 1  MEWS RR 0  MEWS LOC 0  MEWS Score 2  MEWS Score Color Yellow  Assess: if the MEWS score is Yellow or Red  Were vital signs taken at a resting state? Yes  Focused Assessment No change from prior assessment  Does the patient meet 2 or more of the SIRS criteria? No  MEWS guidelines implemented *See Row Information* No, previously yellow, continue vital signs every 4 hours  Document  Progress note created (see row info) Yes  Assess: SIRS CRITERIA  SIRS Temperature  0  SIRS Pulse 1  SIRS Respirations  0  SIRS WBC 0  SIRS Score Sum  1

## 2021-06-19 NOTE — Evaluation (Signed)
Occupational Therapy Evaluation Patient Details Name: [name] MRN: [id number] DOB: [birth year] Today's Date: 06/19/2021   History of Present Illness Pt is a 80 y.o. female seen in ed with complaints of AMS and fall today , pt slid out of bed, no head injury. Per EMS pt in Afib with RVR. PMH of cOPD, DVT on Eliquis, A. fib on Eliquis, hypertension, hyperlipidemia, congestive heart failure, chronic O2 use at 3L baseline, non-small cell lung cancer status post chemoradiation, AAA status post aortic stent graft followed by vascular surgery, descending thoracic aortic aneurysm.   Clinical Impression   Pt seen for OT evaluation this date. Upon arrival to room, pt finishing session with PT. Pt agreeable to OT eval/tx. At baseline, pt lives in an assisted living facility and is independent with feeding, dressing, and toileting. Pt reports that she receives SUPERVISION for bathing and "furniture walks" around her apartment. Pt currently requires SET-UP assist for seated grooming tasks, MIN GUARD for functional mobility of short household distances with RW, and MIN A for BSC transfers due to current functional impairments (See OT Problem List below). Pt only able to walk ~75ft and perform BSC transfer before reporting significant fatigue and requesting rest break. Pt currently presents with decreased activity tolerance and strength compared to baseline, and would benefit from additional skilled OT services to maximize return to PLOF and minimize risk of future falls, injury, caregiver burden, and readmission. Upon discharge, recommend SNF.      Recommendations for follow up therapy are one component of a multi-disciplinary discharge planning process, led by the attending physician.  Recommendations may be updated based on patient status, additional functional criteria and insurance authorization.   Follow Up Recommendations  Skilled nursing-short term rehab (<3 hours/day)    Assistance Recommended at  Discharge Frequent or constant Supervision/Assistance  Functional Status Assessment  Patient has had a recent decline in their functional status and demonstrates the ability to make significant improvements in function in a reasonable and predictable amount of time.  Equipment Recommendations  Other (comment) (defer to next venue of care)       Precautions / Restrictions Precautions Precautions: Fall Precaution Comments: watch HR, O2 Restrictions Weight Bearing Restrictions: No      Mobility Bed Mobility Overal bed mobility: Needs Assistance Bed Mobility: Supine to Sit     Supine to sit: Supervision;HOB elevated     General bed mobility comments: not assessed, pt in recliner at beginning/end of session    Transfers Overall transfer level: Needs assistance   Transfers: Sit to/from Stand Sit to Stand: Min assist;Min guard     Step pivot transfers: Min guard     General transfer comment: MIN GUARD from recliner, MIN A from BSC (d/t low height)      Balance Overall balance assessment: Needs assistance Sitting-balance support: No upper extremity supported;Feet supported Sitting balance-Leahy Scale: Fair Sitting balance - Comments: Supervision reaching within BOS   Standing balance support: Bilateral upper extremity supported;During functional activity;Reliant on assistive device for balance Standing balance-Leahy Scale: Fair Standing balance comment: requires MIN GUARD for functional mobility of short household distances                           ADL either performed or assessed with clinical judgement   ADL Overall ADL's : Needs assistance/impaired     Grooming: Wash/dry face;Applying deodorant;Set up;Sitting  Toilet Transfer: Minimal assistance;Ambulation;BSC/3in1           Functional mobility during ADLs: Min guard;Rolling walker (2 wheels) (to walk 5 ft)       Vision Baseline Vision/History: 1 Wears  glasses Ability to See in Adequate Light: 0 Adequate Patient Visual Report: No change from baseline              Pertinent Vitals/Pain Pain Assessment: No/denies pain Pain Score: 7  Pain Location: back pain Pain Descriptors / Indicators: Aching Pain Intervention(s): Limited activity within patient's tolerance;Monitored during session     Hand Dominance Right   Extremity/Trunk Assessment Upper Extremity Assessment Upper Extremity Assessment: RUE deficits/detail;LUE deficits/detail RUE Deficits / Details: Grossly at least 3+/5 in all movements LUE Deficits / Details: Unable to flex shoulder >30 degrees. Grossly at least 3+/5 in remaining distal movements LUE: Shoulder pain with ROM   Lower Extremity Assessment Lower Extremity Assessment: Generalized weakness   Cervical / Trunk Assessment Cervical / Trunk Assessment: Kyphotic   Communication Communication Communication: No difficulties   Cognition Arousal/Alertness: Awake/alert Behavior During Therapy: WFL for tasks assessed/performed Overall Cognitive Status: No family/caregiver present to determine baseline cognitive functioning                                 General Comments: A&Ox4. Requires increased processing time                Home Living Family/patient expects to be discharged to:: Assisted living                             Home Equipment: Rolling Walker (2 wheels);Toilet riser;Grab bars - tub/shower;Grab bars - toilet;Hand held shower head;Shower seat - built in          Prior Functioning/Environment Prior Level of Function : Needs assist;Driving;Independent/Modified Independent       Physical Assist : ADLs (physical)   ADLs (physical): IADLs Mobility Comments: furniture walks in her apartment, keeps a walker in her car for community ambulation. pt stated she has not driven or left her apartment in several weeks ADLs Comments: able to dress, feed, toilet independently.  assisted living provides supervision for showering, provides meals, cleans        OT Problem List: Decreased strength;Decreased activity tolerance;Impaired balance (sitting and/or standing)      OT Treatment/Interventions: Self-care/ADL training;Therapeutic exercise;Energy conservation;DME and/or AE instruction;Therapeutic activities;Patient/family education;Balance training    OT Goals(Current goals can be found in the care plan section) Acute Rehab OT Goals Patient Stated Goal: to return home OT Goal Formulation: With patient Time For Goal Achievement: 07/03/21 Potential to Achieve Goals: Good ADL Goals Pt Will Perform Lower Body Dressing: with min guard assist;sitting/lateral leans (with LRAD) Pt Will Transfer to Toilet: with supervision;ambulating;bedside commode Pt Will Perform Toileting - Clothing Manipulation and hygiene: with min guard assist;sit to/from stand  OT Frequency: Min 2X/week    AM-PAC OT "6 Clicks" Daily Activity     Outcome Measure Help from another person eating meals?: None Help from another person taking care of personal grooming?: A Little Help from another person toileting, which includes using toliet, bedpan, or urinal?: A Little Help from another person bathing (including washing, rinsing, drying)?: A Lot Help from another person to put on and taking off regular upper body clothing?: A Little Help from another person to put on and taking off regular lower body clothing?:  A Lot 6 Click Score: 17   End of Session Equipment Utilized During Treatment: Rolling walker (2 wheels);Oxygen  Activity Tolerance: Patient tolerated treatment well Patient left: in chair;with call bell/phone within reach;with chair alarm set  OT Visit Diagnosis: Muscle weakness (generalized) (M62.81);Unsteadiness on feet (R26.81);History of falling (Z91.81)                Time: 2091-9802 OT Time Calculation (min): 25 min Charges:  OT General Charges $OT Visit: 1 Visit OT  Evaluation $OT Eval Moderate Complexity: 1 Mod OT Treatments $Self Care/Home Management : 8-22 mins  Fredirick Maudlin, OTR/L Vera

## 2021-06-19 NOTE — Progress Notes (Addendum)
PROGRESS NOTE    [name]  ZSW:[id number] DOB: 06/17/1941 DOA: 06/17/2021 PCP: Burnard Bunting, MD   Brief Narrative: [name] is a 80 y.o. female with a history of persistent atrial fibrillation, AAA, anxiety, depression, COPD, hypothyroidism, chronic combined systolic and diastolic heart failure. Patient presented secondary to altered mental status and dyspnea from ALF and found to have acute on chronic respiratory failure.   Assessment & Plan:   Principal Problem:   AMS (altered mental status) Active Problems:   COPD (chronic obstructive pulmonary disease) (HCC)   Hypothyroid   HTN (hypertension)   Atrial fibrillation with RVR (HCC)   Chronic combined systolic and diastolic CHF (congestive heart failure) (HCC)   Acute on chronic respiratory failure with hypoxia (HCC)   Acute on chronic respiratory failure Patient is on 3L/min of oxygen at baseline. Requiring up to 6 L/min of oxygen this admission. Unsure of etiology of acute worsening but possibly related to atelectasis. No evidence of infection or fluid on imaging. Patient has been on Lasix IV though which may be contributing to improvement. -Wean to home 3 L/min  Persistent atrial fibrillation with RVR Cardiology consulted. Continued RVR. -Cardiology recommendations: metoprolol 12.5 mg QID, digoxin, Eliquis  AAA Stable  Hypothyroidism -Continue Synthroid  Generalized weakness PT recommending SNF.  Chronic combined systolic and diastolic heart failure Well compensated. EF of 35-40%. Started on Lasix IV while inpatient -Continue Lasix IV as long as creatinine can tolerate  COPD Appears stable and without exacerbation. -Continue Spiriva, Xopenex  Disorientation Resolved. Possibly secondary to hypoxia.   DVT prophylaxis: Eliquis Code Status:   Code Status: DNR Family Communication: None at bedside Disposition Plan: Discharge to SNF likely in 1-3 days pending ability to wean down oxygen to  baseline and control of RVR with cardiology recommendations   Consultants:  Cardiology  Procedures:  TRANSTHORACIC ECHOCARDIOGRAM (06/18/2021) IMPRESSIONS     1. Left ventricular ejection fraction, by estimation, is 35 to 40%. The  left ventricle has moderately decreased function. The left ventricle  demonstrates global hypokinesis. There is mild left ventricular  hypertrophy. Left ventricular diastolic function   could not be evaluated.   2. Right ventricular systolic function is moderately reduced. The right  ventricular size is normal. There is moderately elevated pulmonary artery  systolic pressure.   3. Left atrial size was moderately dilated.   4. Right atrial size was moderately dilated.   5. The mitral valve is degenerative. Mild mitral valve regurgitation. No  evidence of mitral stenosis. Severe mitral annular calcification.   6. Tricuspid valve regurgitation is moderate.   7. The aortic valve is tricuspid. There is mild thickening of the aortic  valve. Aortic valve regurgitation is not visualized. Aortic valve  sclerosis is present, with no evidence of aortic valve stenosis.   8. The inferior vena cava is normal in size with greater than 50%  respiratory variability, suggesting right atrial pressure of 3 mmHg.  Antimicrobials: None    Subjective: Continued RVR today, although mild. Oxygen down to 5 L/min. Breathing is better but not baseline.  Objective: Vitals:   06/19/21 1532 06/19/21 1553 06/19/21 1741 06/19/21 2022  BP: 128/84 103/79 (!) 109/93 122/61  Pulse: (!) 102 (!) 125 (!) 115 (!) 117  Resp:  16  20  Temp:  98.6 F (37 C)  98.1 F (36.7 C)  TempSrc:      SpO2: 94% 95%  94%  Weight:      Height:  Intake/Output Summary (Last 24 hours) at 06/19/2021 2042 Last data filed at 06/19/2021 1700 Gross per 24 hour  Intake 773 ml  Output 850 ml  Net -77 ml   Filed Weights   06/17/21 1440 06/19/21 0438  Weight: 93.3 kg 88.8 kg   Physical  exam  General exam: Appears calm and comfortable Respiratory system: Clear to auscultation but diminished. No wheezing heard. Respiratory effort normal. Cardiovascular system: S1 & S2 heard. No murmurs, rubs, gallops or clicks. Gastrointestinal system: Abdomen is nondistended, soft and nontender. No organomegaly or masses felt. Normal bowel sounds heard. Central nervous system: Alert and oriented. No focal neurological deficits. Musculoskeletal: Trace BLE edema. No calf tenderness Skin: No cyanosis. Chronic venous changes Psychiatry: Judgement and insight appear normal. Mood & affect appropriate.   Data Reviewed: I have personally reviewed following labs and imaging studies  CBC Lab Results  Component Value Date   WBC 7.0 06/18/2021   RBC 4.63 06/18/2021   HGB 16.0 (H) 06/18/2021   HCT 47.1 (H) 06/18/2021   MCV 101.7 (H) 06/18/2021   MCH 34.6 (H) 06/18/2021   PLT 144 (L) 06/18/2021   MCHC 34.0 06/18/2021   RDW 13.6 06/18/2021   LYMPHSABS 1.2 05/04/2021   MONOABS 1.0 05/04/2021   EOSABS 0.0 05/04/2021   BASOSABS 0.0 32/99/2426     Last metabolic panel Lab Results  Component Value Date   NA 137 06/19/2021   K 4.0 06/19/2021   CL 98 06/19/2021   CO2 31 06/19/2021   BUN 17 06/19/2021   CREATININE 0.78 06/19/2021   GLUCOSE 113 (H) 06/19/2021   GFRNONAA >60 06/19/2021   GFRAA 61 07/21/2019   CALCIUM 9.1 06/19/2021   PHOS 3.5 01/21/2012   PROT 7.1 06/17/2021   ALBUMIN 3.6 06/17/2021   BILITOT 1.5 (H) 06/17/2021   ALKPHOS 48 06/17/2021   AST 36 06/17/2021   ALT 16 06/17/2021   ANIONGAP 8 06/19/2021    CBG (last 3)  No results for input(s): GLUCAP in the last 72 hours.   GFR: Estimated Creatinine Clearance: 60.9 mL/min (by C-G formula based on SCr of 0.78 mg/dL).  Coagulation Profile: No results for input(s): INR, PROTIME in the last 168 hours.  Recent Results (from the past 240 hour(s))  Resp Panel by RT-PCR (Flu A&B, Covid) Nasopharyngeal Swab     Status:  None   Collection Time: 06/17/21  6:02 PM   Specimen: Nasopharyngeal Swab; Nasopharyngeal(NP) swabs in vial transport medium  Result Value Ref Range Status   SARS Coronavirus 2 by RT PCR NEGATIVE NEGATIVE Final    Comment: (NOTE) SARS-CoV-2 target nucleic acids are NOT DETECTED.  The SARS-CoV-2 RNA is generally detectable in upper respiratory specimens during the acute phase of infection. The lowest concentration of SARS-CoV-2 viral copies this assay can detect is 138 copies/mL. A negative result does not preclude SARS-Cov-2 infection and should not be used as the sole basis for treatment or other patient management decisions. A negative result may occur with  improper specimen collection/handling, submission of specimen other than nasopharyngeal swab, presence of viral mutation(s) within the areas targeted by this assay, and inadequate number of viral copies(<138 copies/mL). A negative result must be combined with clinical observations, patient history, and epidemiological information. The expected result is Negative.  Fact Sheet for Patients:  EntrepreneurPulse.com.au  Fact Sheet for Healthcare Providers:  IncredibleEmployment.be  This test is no t yet approved or cleared by the [place] FDA and  has been authorized for detection and/or diagnosis of SARS-CoV-2  by FDA under an Emergency Use Authorization (EUA). This EUA will remain  in effect (meaning this test can be used) for the duration of the COVID-19 declaration under Section 564(b)(1) of the Act, 21 U.S.C.section 360bbb-3(b)(1), unless the authorization is terminated  or revoked sooner.       Influenza A by PCR NEGATIVE NEGATIVE Final   Influenza B by PCR NEGATIVE NEGATIVE Final    Comment: (NOTE) The Xpert Xpress SARS-CoV-2/FLU/RSV plus assay is intended as an aid in the diagnosis of influenza from Nasopharyngeal swab specimens and should not be used as a sole basis for  treatment. Nasal washings and aspirates are unacceptable for Xpert Xpress SARS-CoV-2/FLU/RSV testing.  Fact Sheet for Patients: EntrepreneurPulse.com.au  Fact Sheet for Healthcare Providers: IncredibleEmployment.be  This test is not yet approved or cleared by the [place] FDA and has been authorized for detection and/or diagnosis of SARS-CoV-2 by FDA under an Emergency Use Authorization (EUA). This EUA will remain in effect (meaning this test can be used) for the duration of the COVID-19 declaration under Section 564(b)(1) of the Act, 21 U.S.C. section 360bbb-3(b)(1), unless the authorization is terminated or revoked.  Performed at [facility], [address] [place], [place] [address]   MRSA Next Gen by PCR, Nasal     Status: None   Collection Time: 06/19/21  4:47 AM   Specimen: Nasal Mucosa; Nasal Swab  Result Value Ref Range Status   MRSA by PCR Next Gen NOT DETECTED NOT DETECTED Final    Comment: (NOTE) The GeneXpert MRSA Assay (FDA approved for NASAL specimens only), is one component of a comprehensive MRSA colonization surveillance program. It is not intended to diagnose MRSA infection nor to guide or monitor treatment for MRSA infections. Test performance is not FDA approved in patients less than 6 years old. Performed at [facility], [address] [place], [place] [address]         Radiology Studies: ECHOCARDIOGRAM COMPLETE  Result Date: 06/18/2021    ECHOCARDIOGRAM REPORT   Patient Name:   [name] Date of Exam: 06/18/2021 Medical Rec #:  [id number]        Height:       66.0 in Accession #:    [id number]       Weight:       205.7 lb Date of Birth:  27-Apr-1941        BSA:          2.024 m Patient Age:    6 years         BP:           146/95 mmHg Patient Gender: F                HR:           141 bpm. Exam Location:  [facility] Procedure: 2D Echo, Cardiac Doppler and Color Doppler Indications:      CHF-acute systolic 195.09 / T26.71  History:         Patient has prior history of Echocardiogram examinations, most                  recent 03/21/2021. COPD, Arrythmias:Atrial Fibrillation and                  Atrial Flutter; Risk Factors:Hypertension and Dyslipidemia.  Sonographer:     Sherrie Sport Referring Phys:  [id number] Gretta Cool PATEL Diagnosing Phys: Nelva Bush MD  Sonographer Comments: Suboptimal apical window. IMPRESSIONS  1. Left  ventricular ejection fraction, by estimation, is 35 to 40%. The left ventricle has moderately decreased function. The left ventricle demonstrates global hypokinesis. There is mild left ventricular hypertrophy. Left ventricular diastolic function  could not be evaluated.  2. Right ventricular systolic function is moderately reduced. The right ventricular size is normal. There is moderately elevated pulmonary artery systolic pressure.  3. Left atrial size was moderately dilated.  4. Right atrial size was moderately dilated.  5. The mitral valve is degenerative. Mild mitral valve regurgitation. No evidence of mitral stenosis. Severe mitral annular calcification.  6. Tricuspid valve regurgitation is moderate.  7. The aortic valve is tricuspid. There is mild thickening of the aortic valve. Aortic valve regurgitation is not visualized. Aortic valve sclerosis is present, with no evidence of aortic valve stenosis.  8. The inferior vena cava is normal in size with greater than 50% respiratory variability, suggesting right atrial pressure of 3 mmHg. FINDINGS  Left Ventricle: Left ventricular ejection fraction, by estimation, is 35 to 40%. The left ventricle has moderately decreased function. The left ventricle demonstrates global hypokinesis. The left ventricular internal cavity size was normal in size. There is mild left ventricular hypertrophy. Left ventricular diastolic function could not be evaluated due to atrial fibrillation. Left ventricular diastolic function could not be evaluated.  Right Ventricle: The right ventricular size is normal. No increase in right ventricular wall thickness. Right ventricular systolic function is moderately reduced. There is moderately elevated pulmonary artery systolic pressure. The tricuspid regurgitant velocity is 3.35 m/s, and with an assumed right atrial pressure of 3 mmHg, the estimated right ventricular systolic pressure is 94.8 mmHg. Left Atrium: Left atrial size was moderately dilated. Right Atrium: Right atrial size was moderately dilated. Pericardium: Trivial pericardial effusion is present. Mitral Valve: The mitral valve is degenerative in appearance. There is mild thickening of the mitral valve leaflet(s). Severe mitral annular calcification. Mild mitral valve regurgitation. No evidence of mitral valve stenosis. MV peak gradient, 3.3 mmHg.  The mean mitral valve gradient is 1.0 mmHg. Tricuspid Valve: The tricuspid valve is normal in structure. Tricuspid valve regurgitation is moderate. Aortic Valve: The aortic valve is tricuspid. There is mild thickening of the aortic valve. Aortic valve regurgitation is not visualized. Aortic valve sclerosis is present, with no evidence of aortic valve stenosis. Aortic valve mean gradient measures 2.0  mmHg. Aortic valve peak gradient measures 4.0 mmHg. Aortic valve area, by VTI measures 2.72 cm. Pulmonic Valve: The pulmonic valve was grossly normal. Pulmonic valve regurgitation is trivial. No evidence of pulmonic stenosis. Aorta: The aortic root is normal in size and structure. Venous: The inferior vena cava is normal in size with greater than 50% respiratory variability, suggesting right atrial pressure of 3 mmHg. IAS/Shunts: No atrial level shunt detected by color flow Doppler.  LEFT VENTRICLE PLAX 2D LVIDd:         4.20 cm LVIDs:         3.40 cm LV PW:         1.20 cm LV IVS:        1.10 cm LVOT diam:     2.00 cm LV SV:         32 LV SV Index:   16 LVOT Area:     3.14 cm  RIGHT VENTRICLE RV Basal diam:  3.50 cm RV  S prime:     10.40 cm/s LEFT ATRIUM             Index  RIGHT ATRIUM           Index LA diam:        3.20 cm 1.58 cm/m   RA Area:     25.00 cm LA Vol (A2C):   99.1 ml 48.97 ml/m  RA Volume:   82.60 ml  40.81 ml/m LA Vol (A4C):   83.5 ml 41.26 ml/m LA Biplane Vol: 95.8 ml 47.34 ml/m  AORTIC VALVE                    PULMONIC VALVE AV Area (Vmax):    2.26 cm     PV Vmax:        0.61 m/s AV Area (Vmean):   2.15 cm     PV Vmean:       40.400 cm/s AV Area (VTI):     2.72 cm     PV VTI:         0.077 m AV Vmax:           100.00 cm/s  PV Peak grad:   1.5 mmHg AV Vmean:          61.200 cm/s  PV Mean grad:   1.0 mmHg AV VTI:            0.118 m      RVOT Peak grad: 3 mmHg AV Peak Grad:      4.0 mmHg AV Mean Grad:      2.0 mmHg LVOT Vmax:         71.80 cm/s LVOT Vmean:        41.800 cm/s LVOT VTI:          0.102 m LVOT/AV VTI ratio: 0.86  AORTA Ao Root diam: 3.20 cm MITRAL VALVE                TRICUSPID VALVE MV Area (PHT): 6.12 cm     TR Peak grad:   44.9 mmHg MV Area VTI:   2.91 cm     TR Vmax:        335.00 cm/s MV Peak grad:  3.3 mmHg MV Mean grad:  1.0 mmHg     SHUNTS MV Vmax:       0.90 m/s     Systemic VTI:  0.10 m MV Vmean:      52.9 cm/s    Systemic Diam: 2.00 cm MV Decel Time: 124 msec     Pulmonic VTI:  0.101 m MV E velocity: 119.00 cm/s Nelva Bush MD Electronically signed by Nelva Bush MD Signature Date/Time: 06/18/2021/11:44:19 AM    Final         Scheduled Meds:  apixaban  5 mg Oral BID   citalopram  20 mg Oral Daily   [START ON 06/20/2021] digoxin  0.125 mg Oral Daily   docusate sodium  100 mg Oral BID   furosemide  40 mg Intravenous BID   gabapentin  100 mg Oral TID   levothyroxine  88 mcg Oral Daily   metoprolol tartrate  12.5 mg Oral Q6H   nicotine  14 mg Transdermal Daily   sodium chloride flush  3 mL Intravenous Q12H   tiotropium  18 mcg Inhalation Daily   Continuous Infusions:   LOS: 2 days     Cordelia Poche, MD [facility] 06/19/2021, 8:42 PM  If  7PM-7AM, please contact night-coverage [email or web address]

## 2021-06-19 NOTE — Evaluation (Signed)
Physical Therapy Evaluation Patient Details Name: Denaja Verhoeven MRN: [id number] DOB: 06-Oct-1940 Today's Date: 06/19/2021  History of Present Illness  Pt is a 80 y.o. female seen in ed with complaints of AMS and fall today , pt slid out of bed, no head injury. Per EMS pt in Afib with RVR. PMH of cOPD, DVT on Eliquis, A. fib on Eliquis, hypertension, hyperlipidemia, congestive heart failure, chronic O2 use at 3L baseline, non-small cell lung cancer status post chemoradiation, AAA status post aortic stent graft followed by vascular surgery, descending thoracic aortic aneurysm.   Clinical Impression  Pt alert, agreeable to PT, oriented x4 (did need orientation to exact date in December), denied pain. Pt reported at baseline she lives in assisted living, where meals and cleaning are provided. Furniture walks in her apartment, independent for dressing/toileting, supervision for bathing provided by staff. At least 2 falls in the last 3 months that led to hospital admissions.  The patient was able to move all extremities against gravity. Supine to sit with HOB elevated, extended time needed but pt able to complete without physical assist. Fair sitting balance noted. Sit <> stand with RW and CGA three times during session, effortful. She was able to ambulate ~6ft in the room, limited due to elevated HR and spO2. The patient was very slow, cautious. On 5L desaturated to 85%, able to recover to 90% with rest. HR in 120s-low 140s, did return to 110s-120s with rest as well.  Overall the patient demonstrated deficits (see "PT Problem List") that impede the patient's functional abilities, safety, and mobility and would benefit from skilled PT intervention. Recommendation at this time is SNF due to pt's history of falling  and current deconditioning to maximize pt function and safety.      Recommendations for follow up therapy are one component of a multi-disciplinary discharge planning process, led by the  attending physician.  Recommendations may be updated based on patient status, additional functional criteria and insurance authorization.  Follow Up Recommendations Skilled nursing-short term rehab (<3 hours/day)    Assistance Recommended at Discharge    Functional Status Assessment Patient has had a recent decline in their functional status and demonstrates the ability to make significant improvements in function in a reasonable and predictable amount of time.  Equipment Recommendations  Other (comment) (TBD, but may benefit from a hospital bed if discharging home)    Recommendations for Other Services       Precautions / Restrictions Precautions Precautions: Fall Precaution Comments: watch HR, O2 Restrictions Weight Bearing Restrictions: No      Mobility  Bed Mobility Overal bed mobility: Needs Assistance Bed Mobility: Supine to Sit     Supine to sit: Supervision;HOB elevated     General bed mobility comments: extended time, effortful for patient but able to complete    Transfers Overall transfer level: Needs assistance   Transfers: Sit to/from Stand;Bed to chair/wheelchair/BSC Sit to Stand: Min guard   Step pivot transfers: Min guard            Ambulation/Gait Ambulation/Gait assistance: Min guard Gait Distance (Feet): 7 Feet Assistive device: Rolling walker (2 wheels)   Gait velocity: decreased     General Gait Details: pt very slow, cautious. on 5L desaturated to 85%, able to recover to 90% with rest. HR in 120s-low 140s. does return to 110s-120s with rest.  Stairs            Wheelchair Mobility    Modified Rankin (Stroke Patients Only)  Balance Overall balance assessment: Needs assistance Sitting-balance support: Feet supported Sitting balance-Leahy Scale: Fair     Standing balance support: During functional activity Standing balance-Leahy Scale: Fair                               Pertinent Vitals/Pain Pain  Assessment: No/denies pain    Home Living Family/patient expects to be discharged to:: Assisted living                 Home Equipment: Conservation officer, nature (2 wheels);Toilet riser;Grab bars - tub/shower;Grab bars - toilet;Hand held shower head;Shower seat - built in      Prior Function Prior Level of Function : Needs assist;Driving;Independent/Modified Independent       Physical Assist : ADLs (physical)   ADLs (physical): IADLs Mobility Comments: furniture walks in her apartment, keeps a walker in her car for community ambulation. pt stated she has not driven or left her apartment in several weeks ADLs Comments: able to dress, feed, toilet independently. assisted living provides supervision for showering, provides meals, cleans     Hand Dominance   Dominant Hand: Right    Extremity/Trunk Assessment   Upper Extremity Assessment Upper Extremity Assessment: Defer to OT evaluation    Lower Extremity Assessment Lower Extremity Assessment: Generalized weakness    Cervical / Trunk Assessment Cervical / Trunk Assessment: Kyphotic  Communication   Communication: No difficulties  Cognition Arousal/Alertness: Awake/alert Behavior During Therapy: WFL for tasks assessed/performed Overall Cognitive Status: Within Functional Limits for tasks assessed                                          General Comments      Exercises     Assessment/Plan    PT Assessment Patient needs continued PT services  PT Problem List Decreased strength;Decreased mobility;Decreased activity tolerance;Decreased balance       PT Treatment Interventions DME instruction;Therapeutic exercise;Gait training;Balance training;Neuromuscular re-education;Functional mobility training;Therapeutic activities;Patient/family education    PT Goals (Current goals can be found in the Care Plan section)  Acute Rehab PT Goals Patient Stated Goal: to go home PT Goal Formulation: With patient Time  For Goal Achievement: 07/03/21 Potential to Achieve Goals: Good    Frequency Min 2X/week   Barriers to discharge        Co-evaluation               AM-PAC PT "6 Clicks" Mobility  Outcome Measure Help needed turning from your back to your side while in a flat bed without using bedrails?: A Little Help needed moving from lying on your back to sitting on the side of a flat bed without using bedrails?: A Little Help needed moving to and from a bed to a chair (including a wheelchair)?: A Little Help needed standing up from a chair using your arms (e.g., wheelchair or bedside chair)?: A Little Help needed to walk in hospital room?: A Little Help needed climbing 3-5 steps with a railing? : A Lot 6 Click Score: 17    End of Session Equipment Utilized During Treatment: Gait belt;Oxygen (5L) Activity Tolerance: Patient tolerated treatment well Patient left: in chair;with call bell/phone within reach (with OT at bedside) Nurse Communication: Mobility status PT Visit Diagnosis: Other abnormalities of gait and mobility (R26.89);Difficulty in walking, not elsewhere classified (R26.2);Muscle weakness (generalized) (M62.81)  Time: 0923-1001 PT Time Calculation (min) (ACUTE ONLY): 38 min   Charges:   PT Evaluation $PT Eval Low Complexity: 1 Low PT Treatments $Therapeutic Activity: 23-37 mins       Lieutenant Diego PT, DPT 10:26 AM,06/19/21

## 2021-06-19 NOTE — Progress Notes (Signed)
Progress Note  Patient Name: [name] Date of Encounter: 06/19/2021  Primary Cardiologist: [place]  Subjective   Feeling better today, less SOB. No angina or palpitations. Oxygen requirement down to 5 L via nasal cannula, baseline 3 L. Documented UOP 2.8 L for the past 24 hours, minimal intake noted, net - 2 L for the admission. She remains in Afib with ventricular rates improved from the 130s to 140s down to the low 100s to 120s bpm.  Inpatient Medications    Scheduled Meds:  apixaban  5 mg Oral BID   citalopram  20 mg Oral Daily   docusate sodium  100 mg Oral BID   furosemide  40 mg Intravenous BID   gabapentin  100 mg Oral TID   levothyroxine  88 mcg Oral Daily   metoprolol tartrate  12.5 mg Oral Q6H   nicotine  14 mg Transdermal Daily   sodium chloride flush  3 mL Intravenous Q12H   tiotropium  18 mcg Inhalation Daily   Continuous Infusions:  PRN Meds: acetaminophen **OR** acetaminophen, bisacodyl, hydrALAZINE, levalbuterol, ondansetron **OR** ondansetron (ZOFRAN) IV, oxyCODONE, polyethylene glycol, traZODone   Vital Signs    Vitals:   06/18/21 2325 06/19/21 0437 06/19/21 0438 06/19/21 0731  BP: 106/89 117/70  100/73  Pulse: 93 (!) 110  (!) 104  Resp: 18 20  16   Temp: (!) 97.4 F (36.3 C) 98.6 F (37 C)  98.3 F (36.8 C)  TempSrc:      SpO2: 97% (!) 89% 91% 93%  Weight:   88.8 kg   Height:        Intake/Output Summary (Last 24 hours) at 06/19/2021 0820 Last data filed at 06/19/2021 0600 Gross per 24 hour  Intake 290 ml  Output 2100 ml  Net -1810 ml   Filed Weights   06/17/21 1440 06/19/21 0438  Weight: 93.3 kg 88.8 kg    Telemetry    Afib with RVR with ventricular rates in the low 100s to 120s bpm, rare episodes into the 140s bpm - Personally Reviewed  ECG    No new tracings - Personally Reviewed  Physical Exam   GEN: No acute distress.   Neck: No JVD. Cardiac: Tachycardic, IRIR, II/VI systolic murmur LSB, no rubs, or gallops.   Respiratory: Clear to auscultation bilaterally.  GI: Soft, nontender, non-distended.   MS: No edema; No deformity. Neuro:  Alert and oriented x 3; Nonfocal.  Psych: Normal affect.  Labs    Chemistry Recent Labs  Lab 06/17/21 1802 06/18/21 0328  NA 137 136  K 4.8 4.1  CL 105 102  CO2 24 27  GLUCOSE 113* 113*  BUN 22 20  CREATININE 0.97 1.02*  CALCIUM 9.6 9.0  PROT 7.1  --   ALBUMIN 3.6  --   AST 36  --   ALT 16  --   ALKPHOS 48  --   BILITOT 1.5*  --   GFRNONAA 58* 55*  ANIONGAP 8 7     Hematology Recent Labs  Lab 06/17/21 1802 06/18/21 0328  WBC 8.5 7.0  RBC 4.50 4.63  HGB 15.2* 16.0*  HCT 46.4* 47.1*  MCV 103.1* 101.7*  MCH 33.8 34.6*  MCHC 32.8 34.0  RDW 13.6 13.6  PLT 145* 144*    Cardiac EnzymesNo results for input(s): TROPONINI in the last 168 hours. No results for input(s): TROPIPOC in the last 168 hours.   BNP Recent Labs  Lab 06/17/21 1802  BNP 363.5*  DDimer No results for input(s): DDIMER in the last 168 hours.   Radiology    CT HEAD WO CONTRAST (5MM)  Result Date: 06/17/2021 IMPRESSION: 1. No definite CT evidence for acute intracranial abnormality. 2. Small age indeterminate hypodensity in the right thalamus which may reflect lacunar infarct or small vessel disease. Extensive white matter hypodensity consistent with chronic small vessel ischemic change, progressed compared to prior head CT. Electronically Signed   By: Donavan Foil M.D.   On: 06/17/2021 19:18   DG Chest Portable 1 View  Result Date: 06/17/2021 IMPRESSION: 1. No significant interval change in the persistent bilateral interstitial thickening with superimposed atelectasis. 2. Stable cardiomegaly. Electronically Signed   By: Dahlia Bailiff M.D.   On: 06/17/2021 16:10    Cardiac Studies   2D echo 06/18/2021: 1. Left ventricular ejection fraction, by estimation, is 35 to 40%. The  left ventricle has moderately decreased function. The left ventricle  demonstrates  global hypokinesis. There is mild left ventricular  hypertrophy. Left ventricular diastolic function   could not be evaluated.   2. Right ventricular systolic function is moderately reduced. The right  ventricular size is normal. There is moderately elevated pulmonary artery  systolic pressure.   3. Left atrial size was moderately dilated.   4. Right atrial size was moderately dilated.   5. The mitral valve is degenerative. Mild mitral valve regurgitation. No  evidence of mitral stenosis. Severe mitral annular calcification.   6. Tricuspid valve regurgitation is moderate.   7. The aortic valve is tricuspid. There is mild thickening of the aortic  valve. Aortic valve regurgitation is not visualized. Aortic valve  sclerosis is present, with no evidence of aortic valve stenosis.   8. The inferior vena cava is normal in size with greater than 50%  respiratory variability, suggesting right atrial pressure of 3 mmHg. __________  2D echo 03/21/2021: 1. Left ventricular ejection fraction, by estimation, is 35 to 40%. The  left ventricle has moderately decreased function. The left ventricle  demonstrates global hypokinesis. Left ventricular diastolic parameters are  indeterminate.   2. Right ventricular systolic function is normal. The right ventricular  size is mildly enlarged. There is moderately elevated pulmonary artery  systolic pressure. The estimated right ventricular systolic pressure is  25.9 mmHg.   3. Left atrial size was severely dilated.   4. Right atrial size was severely dilated.   5. The mitral valve is normal in structure. Moderate mitral valve  regurgitation.   6. Tricuspid valve regurgitation is moderate.   7. The inferior vena cava is dilated in size with <50% respiratory  variability, suggesting right atrial pressure of 15 mmHg.   Comparison(s): LVEF 35-40%, Moderate TR. __________   2D echo 09/17/2016: - Left ventricle: The cavity size was normal. Wall thickness was     increased in a pattern of moderate LVH. Systolic function was    moderately reduced. The estimated ejection fraction was in the    range of 35% to 40%. Diffuse hypokinesis. The study is not    technically sufficient to allow evaluation of LV diastolic    function.  - Aortic valve: Trileaflet. Sclerosis without stenosis. There was    no regurgitation.  - Mitral valve: Calcified annulus. Mildly thickened leaflets .  - Left atrium: The atrium was mildly dilated.  - Right ventricle: The cavity size was normal. Mildly decreased RV    systolic function.  - Right atrium: Moderately dilated.  - Tricuspid valve: There was moderate regurgitation.  -  Pulmonary arteries: PA peak pressure: 49 mm Hg (S).  - Inferior vena cava: The vessel was dilated. The respirophasic    diameter changes were blunted (< 50%), consistent with elevated    central venous pressure.   Impressions:   - Compared to a prior study in 2016, the LVEF is reduced now to    35-40%. A-fib wtih RVR is noted. The ascending aorta meausres 4.0    cm. There is mild AI, mild LAE, moderate RAE, moderate TR and    RVSP of 49 mmHg with a dilated IVC. __________   2D echo 04/23/2015: - Left ventricle: The cavity size was normal. Wall thickness was    increased in a pattern of mild LVH. Systolic function was normal.    The estimated ejection fraction was in the range of 55% to 60%.    Wall motion was normal; there were no regional wall motion    abnormalities. Doppler parameters are consistent with    pseudonormal left ventricular relaxation (grade 2 diastolic    dysfunction). The E/e&' ratio is >15, suggesting elevated LV    filling pressure.  - Mitral valve: Calcified annulus.  - Left atrium: The atrium was normal in size.  - Tricuspid valve: There was mild regurgitation.  - Pulmonary arteries: PA peak pressure: 32 mm Hg (S).  - Inferior vena cava: The vessel was dilated. The respirophasic    diameter changes were blunted (<  50%), consistent with elevated    central venous pressure.   Impressions:   - Compared to the prior study in 01/2012, there are few changes. The    RVSP is lower at 32 mmHg, down from 55 mmHg. __________   2D echo 01/22/2012: - Left ventricle: The cavity size was normal. Wall thickness    was normal. Systolic function was normal. The estimated    ejection fraction was in the range of 55% to 60%. Features    are consistent with a pseudonormal left ventricular    filling pattern, with concomitant abnormal relaxation and    increased filling pressure (grade 2 diastolic    dysfunction).  - Mitral valve: Mild regurgitation.  - Left atrium: The atrium was moderately dilated.  - Pulmonary arteries: PA peak pressure: 49mm Hg (S).   Patient Profile     80 y.o. female with history of persistent A. fib on apixaban, chronic combined systolic and diastolic CHF, chronic hypoxic respiratory failure on supplemental oxygen at 3 L via nasal cannula, COPD, pulmonary hypertension, DVT, non-small cell lung cancer status post chemoradiation, AAA status post aortic stent graft followed by vascular surgery, descending thoracic aortic aneurysm, HTN, HLD, hypothyroidism, osteoarthritis, OSA, who is being seen today for the evaluation of A. fib with RVR at the request of Dr. Posey Pronto.  Assessment & Plan    1. Persistent A. fib with RVR: -She remains in A. fib with RVR with ventricular rates in the low 100s to 120s bpm -Possibly exacerbated by underlying decline in pulmonary status, hopefully as this improves her ventricular rates will follow -Last documented to be in sinus rhythm on 07/21/2019 -Continue Lopressor to 12.5 mg every 6 hours, may need to escalate further if BP allows -PTA digoxin with current digoxin level 0.7, continue current dose of digoxin -Would favor avoiding diltiazem with her underlying cardiomyopathy  -Albuterol has been stopped, if nebs are needed moving forward, consider Xopenex in place of  albuterol -CHA2DS2-VASc at least 6 (CHF, HTN, age x2, vascular disease, sex category) -PTA apixaban -Not a  good candidate for amiodarone given severe lung disease; Tikosyn/sotalol have been contraindicated due to prolonged QT; Multaq and flecainide not ideal choices with CHF   2.  Chronic combined systolic and diastolic CHF: -Appears largely euvolemic -Echo this admission demonstrated a stable cardiomyopathy -Consider noninvasive ischemic testing once respiratory status has improved    3.  Acute on chronic hypoxic respiratory failure/NSC lung cancer: -She continues to require increased supplemental oxygen -Her chronic dyspnea has previously been felt to be related to underlying pulmonary disease including prior lung cancer, COPD, and pulmonary hypertension -She does not appear grossly volume overloaded on exam, with chest x-ray, and has a BNP that is consistent with her prior readings -PE is less likely given that she has been chronically anticoagulated with apixaban, and less she has been missing doses -Less likely heart failure in etiology, though if needed, could consider RHC to further assess hemodynamics  -Ongoing management per primary team   4.  Rhabdomyolysis: -Gentle hydration -Management per primary service   5.  AAA/saccular descending thoracic aortic aneurysm: -Stable on most recent imaging -Followed by vascular surgery    For questions or updates, please contact [facility] Please consult [email or web address] for contact info under Cardiology/STEMI.    Signed, Christell Faith, PA-C [place] Pager: [phone] 06/19/2021, 8:20 AM

## 2021-06-19 NOTE — Progress Notes (Signed)
Nutrition Brief Note  RD consulted for assessment of nutritional requirements/ status.   Wt Readings from Last 15 Encounters:  06/19/21 88.8 kg  05/05/21 93.3 kg  03/07/21 90.3 kg  02/21/21 91.5 kg  01/09/21 89 kg  09/10/20 99.5 kg  06/27/20 96.6 kg  12/05/19 99.8 kg  11/14/19 100.7 kg  08/23/19 101.5 kg  07/21/19 100.9 kg  06/15/19 102.4 kg  01/19/19 102.8 kg  01/15/19 102.5 kg  11/04/18 103.1 kg   [name] is a 80 y.o. female seen in ed with complaints of AMS and fall today , pt slid out of bed this morning no head injury. Per EMS pt in Afib rvr , COPD, DVT on Eliquis, A. fib on Eliquis, hypertension, hyperlipidemia, congestive heart failure.  123 glucose, RR 40, afebrile and 134/86.  Patient currently on 6 L of oxygen and by nasal cannula chronically she is on 3 L.  Pt admitted with AMS.  Pt sleeping soundly at time of visit. She did not arouse to voice or touch. Noted meal tray on tray table- pt consumed 100%.   Reviewed wt hx; wt has been stable over the past 3 months.   Nutrition-Focused physical exam completed. Findings are no fat depletion, no muscle depletion, and mild edema.    Medications reviewed and include colace and lasix  Labs reviewed.   Current diet order is Heart Healthy with 1.5 L fluid restriction, patient is consuming approximately 100% of meals at this time. Labs and medications reviewed.   No nutrition interventions warranted at this time. If nutrition issues arise, please consult RD.   Loistine Chance, RD, LDN, [place] Registered Dietitian II Certified Diabetes Care and Education Specialist Please refer to [facility] for RD and/or RD on-call/weekend/after hours pager

## 2021-06-20 DIAGNOSIS — F32A Depression, unspecified: Secondary | ICD-10-CM | POA: Diagnosis not present

## 2021-06-20 DIAGNOSIS — Z85118 Personal history of other malignant neoplasm of bronchus and lung: Secondary | ICD-10-CM | POA: Diagnosis not present

## 2021-06-20 DIAGNOSIS — F419 Anxiety disorder, unspecified: Secondary | ICD-10-CM | POA: Diagnosis not present

## 2021-06-20 DIAGNOSIS — I4891 Unspecified atrial fibrillation: Secondary | ICD-10-CM | POA: Diagnosis not present

## 2021-06-20 DIAGNOSIS — Z7901 Long term (current) use of anticoagulants: Secondary | ICD-10-CM | POA: Diagnosis not present

## 2021-06-20 DIAGNOSIS — I4819 Other persistent atrial fibrillation: Secondary | ICD-10-CM | POA: Diagnosis not present

## 2021-06-20 DIAGNOSIS — Z9181 History of falling: Secondary | ICD-10-CM | POA: Diagnosis not present

## 2021-06-20 DIAGNOSIS — I739 Peripheral vascular disease, unspecified: Secondary | ICD-10-CM | POA: Diagnosis not present

## 2021-06-20 DIAGNOSIS — J449 Chronic obstructive pulmonary disease, unspecified: Secondary | ICD-10-CM | POA: Diagnosis not present

## 2021-06-20 DIAGNOSIS — R410 Disorientation, unspecified: Secondary | ICD-10-CM | POA: Diagnosis not present

## 2021-06-20 DIAGNOSIS — E039 Hypothyroidism, unspecified: Secondary | ICD-10-CM | POA: Diagnosis not present

## 2021-06-20 DIAGNOSIS — N390 Urinary tract infection, site not specified: Secondary | ICD-10-CM | POA: Diagnosis not present

## 2021-06-20 DIAGNOSIS — E785 Hyperlipidemia, unspecified: Secondary | ICD-10-CM | POA: Diagnosis not present

## 2021-06-20 DIAGNOSIS — A4151 Sepsis due to Escherichia coli [E. coli]: Secondary | ICD-10-CM | POA: Diagnosis not present

## 2021-06-20 DIAGNOSIS — J9621 Acute and chronic respiratory failure with hypoxia: Secondary | ICD-10-CM | POA: Diagnosis not present

## 2021-06-20 DIAGNOSIS — I110 Hypertensive heart disease with heart failure: Secondary | ICD-10-CM | POA: Diagnosis not present

## 2021-06-20 DIAGNOSIS — I5042 Chronic combined systolic (congestive) and diastolic (congestive) heart failure: Secondary | ICD-10-CM | POA: Diagnosis not present

## 2021-06-20 LAB — DIGOXIN LEVEL: Digoxin Level: 1.4 ng/mL (ref 0.8–2.0)

## 2021-06-20 LAB — BASIC METABOLIC PANEL
Anion gap: 7 (ref 5–15)
BUN: 16 mg/dL (ref 8–23)
CO2: 29 mmol/L (ref 22–32)
Calcium: 8.8 mg/dL — ABNORMAL LOW (ref 8.9–10.3)
Chloride: 99 mmol/L (ref 98–111)
Creatinine, Ser: 0.68 mg/dL (ref 0.44–1.00)
GFR, Estimated: >60 mL/min (ref >60)
Glucose, Bld: 91 mg/dL (ref 70–99)
Potassium: 3.5 mmol/L (ref 3.5–5.1)
Sodium: 135 mmol/L (ref 135–145)

## 2021-06-20 MED ORDER — METOPROLOL SUCCINATE ER 25 MG PO TB24
25.0000 mg | ORAL_TABLET | Freq: Two times a day (BID) | ORAL | Status: DC
  Administered 2021-06-20 – 2021-06-25 (×9): 25 mg via ORAL
  Filled 2021-06-20 (×10): qty 1

## 2021-06-20 MED ORDER — MORPHINE SULFATE 15 MG PO TABS
15.0000 mg | ORAL_TABLET | Freq: Three times a day (TID) | ORAL | Status: DC | PRN
  Administered 2021-06-20 – 2021-06-24 (×6): 15 mg via ORAL
  Filled 2021-06-20 (×7): qty 1

## 2021-06-20 MED ORDER — POTASSIUM CHLORIDE CRYS ER 20 MEQ PO TBCR
40.0000 meq | EXTENDED_RELEASE_TABLET | ORAL | Status: AC
  Administered 2021-06-20 (×2): 40 meq via ORAL
  Filled 2021-06-20 (×2): qty 2

## 2021-06-20 NOTE — Progress Notes (Signed)
Mobility Specialist - Progress Note   06/20/21 1100  Mobility  Activity Dangled on edge of bed;Stood at bedside;Transferred:  Bed to chair  Level of Assistance Standby assist, set-up cues, supervision of patient - no hands on  Assistive Device Front wheel walker  Distance Ambulated (ft) 4 ft  Mobility Out of bed to chair with meals  Mobility Response Tolerated well  Mobility performed by Mobility specialist  $Mobility charge 1 Mobility    Pt lying in bed upon arrival, utilizing 4L. AOx3. Pt initally denied pain, nausea, and fatigue. MinA and extended time to sit EOB, pt voiced feeling generalized weakness once sitting. Noted soiled sheets, NT entered to assist with pt bathing and linen change. Pt stood to RW with minA and took several retro steps towards recliner. VC for hand placement and sequencing. O2 desat to 88% with transfer---PLB recovery to 92% prior to exit. Pt left in chair with alarm set, needs in reach. Pt requesting pain medicine at end of session. RN notified.    Kathee Delton Mobility Specialist 06/20/21, 11:34 AM

## 2021-06-20 NOTE — Progress Notes (Addendum)
Mobility Specialist - Progress Note   06/20/21 1400  Mobility  Activity Transferred:  Chair to bed  Level of Assistance Standby assist, set-up cues, supervision of patient - no hands on  Assistive Device Front wheel walker  Distance Ambulated (ft) 2 ft  Mobility Sit up in bed/chair position for meals  Mobility Response Tolerated well  Mobility performed by Mobility specialist  $Mobility charge 1 Mobility    Pt returned chair-bed with supervision. MinA and extra time to stand. Fatigued with transfer. Still slow and cautious with OOB activity. Requires cueing for sequencing. O2 desat to 83% 4L with transfer. Assistance for LE support to return supine. Alarm set, needs in reach.    Kathee Delton Mobility Specialist 06/20/21, 3:00 PM

## 2021-06-20 NOTE — TOC Initial Note (Signed)
Transition of Care [facility]) - Initial/Assessment Note    Patient Details  Name: [name] MRN: [id number] Date of Birth: 21-Jan-1941  [facility] CM/SW Contact:    Alberteen Sam, LCSW Phone Number: 06/20/2021, 3:35 PM  Clinical Narrative:                  Patient from [facility]F and per MD has desire to return there instead of SNF. Per RN patient's mobility appears improved, will request PT assess for appropriate dispo at discharge.   TOC will continue to follow.    Expected Discharge Plan: Assisted Living Barriers to Discharge: Continued Medical Work up   Patient Goals and CMS Choice Patient states their goals for this hospitalization and ongoing recovery are:: to go home CMS Medicare.gov Compare Post Acute Care list provided to:: Patient Choice offered to / list presented to : Patient  Expected Discharge Plan and Services Expected Discharge Plan: Assisted Living       Living arrangements for the past 2 months: Assisted Living Facility                                      Prior Living Arrangements/Services Living arrangements for the past 2 months: [place] Lives with:: Facility Resident                   Activities of Daily Living Home Assistive Devices/Equipment: Oxygen, Environmental consultant (specify type) ADL Screening (condition at time of admission) Patient's cognitive ability adequate to safely complete daily activities?: Yes Is the patient deaf or have difficulty hearing?: No Does the patient have difficulty seeing, even when wearing glasses/contacts?: No Does the patient have difficulty concentrating, remembering, or making decisions?: No Patient able to express need for assistance with ADLs?: Yes Does the patient have difficulty dressing or bathing?: Yes Independently performs ADLs?: No Communication: Independent Dressing (OT): Needs assistance Is this a change from baseline?: Pre-admission baseline Grooming:  Independent Feeding: Independent Bathing: Needs assistance Is this a change from baseline?: Pre-admission baseline In/Out Bed: Independent with device (comment) Does the patient have difficulty walking or climbing stairs?: Yes Weakness of Legs: Both Weakness of Arms/Hands: None  Permission Sought/Granted                  Emotional Assessment       Orientation: : Oriented to Self, Oriented to Place, Oriented to  Time, Oriented to Situation Alcohol / Substance Use: Not Applicable Psych Involvement: No (comment)  Admission diagnosis:  Confusion [R41.0] Hypoxia [R09.02] Acute on chronic congestive heart failure, unspecified heart failure type ([place]) [I50.9] AMS (altered mental status) [R41.82] Patient Active Problem List   Diagnosis Date Noted   AMS (altered mental status) 06/17/2021   Acute on chronic respiratory failure with hypoxia ([place]) 50/02/3817   Acute metabolic encephalopathy 29/93/7169   UTI (urinary tract infection)    Severe sepsis ([place])    Anticoagulant long-term use    Physical deconditioning 06/16/2019   Hypokalemia    Chronic combined systolic and diastolic CHF (congestive heart failure) ([place]) 09/15/2016   Hyperlipidemia    Chronic respiratory failure with hypoxia ([place]) 12/03/2014   Insomnia, chronic 01/27/2014   Atrial fibrillation with RVR ([place])    Multiple thyroid nodules 10/17/2012   AAA (abdominal aortic aneurysm) 05/11/2012   Bradycardia 01/23/2012   Hypothyroid 01/21/2012   HTN (hypertension) 01/21/2012   Edema 11/19/2011   Atrial  flutter ([place]) 11/20/2010   Obstructive sleep apnea 10/16/2008   DVT 08/19/2008   COPD (chronic obstructive pulmonary disease) ([place]) 08/19/2008   NEOPLASM, MALIGNANT, LUNG, HX OF 68/05/7516   Diastolic heart failure, NYHA class 2 ([place]) 08/03/2008   Small cell carcinoma of lung ([place]) 06/23/2002   Hyperbilirubinemia 2004   PCP:  Burnard Bunting, MD Pharmacy:   [place], [place] [address] Phone: [phone] Fax: [phone]  [id number] - [place], [place] - [address] [place] [address] Phone: [phone] Fax: [phone]  [id number] - [place], [place] [address] [place] [address] Phone: [phone] Fax: [phone]     Social Determinants of Health (SDOH) Interventions    Readmission Risk Interventions No flowsheet data found.

## 2021-06-20 NOTE — Progress Notes (Signed)
Physical Therapy Treatment Patient Details Name: [name] MRN: [id number] DOB: [birth year] Today's Date: 06/20/2021   History of Present Illness Pt is a 80 y.o. female seen in ed with complaints of AMS and fall today , pt slid out of bed, no head injury. Per EMS pt in Afib with RVR. PMH of cOPD, DVT on Eliquis, A. fib on Eliquis, hypertension, hyperlipidemia, congestive heart failure, chronic O2 use at 3L baseline, non-small cell lung cancer status post chemoradiation, AAA status post aortic stent graft followed by vascular surgery, descending thoracic aortic aneurysm.    PT Comments    " Pt was long sitting in bed upon arriving without O2 in place. Sao2 78%. Reapplied 4L o2 [place] and pt was able to recover to >90%. Re-educated pt on importance of wearing O2 all the time." I didn't even realize I didn't have it on."  She was able to maintain >88% on 4 L o2 throughout the remainder of session. She required slightly more assistance to exit bed today versus previous date. She stood to RW and ambulated ~ 15 ft with extremely slow cadence. No LOB however fatigued quickly with slight SOB noted. Acute PT continues to recommend DC to SNF however pt is planning to return to ALF. Per pt request, Author did contact pt's daughter and discussed recommendation and pt's progress. She will continue to benefit from skilled PT at DC to address deficits while maximizing independence with all ADLs.   Recommendations for follow up therapy are one component of a multi-disciplinary discharge planning process, led by the attending physician.  Recommendations may be updated based on patient status, additional functional criteria and insurance authorization.  Follow Up Recommendations  Skilled nursing-short term rehab (<3 hours/day)     Assistance Recommended at Discharge Frequent or constant Supervision/Assistance  Equipment Recommendations  Rolling walker (2 wheels);BSC/3in1;Other (comment) (home O2 already in  place)       Precautions / Restrictions Precautions Precautions: Fall Precaution Comments: watch HR, O2 Restrictions Weight Bearing Restrictions: No     Mobility  Bed Mobility Overal bed mobility: Needs Assistance Bed Mobility: Supine to Sit     Supine to sit: Min guard;Min assist;HOB elevated     General bed mobility comments: Pt required increased assistance to exit bed today 2/2 to fatigue. took ~ 4 mintes to achieve EOB short sit    Transfers Overall transfer level: Needs assistance Equipment used: Rolling walker (2 wheels) Transfers: Sit to/from Stand Sit to Stand: Min assist;Min guard           General transfer comment: CGA to stand form elevated bed height. min assist to stand from lower surface heights    Ambulation/Gait Ambulation/Gait assistance: Min guard;Min assist Gait Distance (Feet): 15 Feet Assistive device: Rolling walker (2 wheels) Gait Pattern/deviations: Step-to pattern;Trunk flexed Gait velocity: decreased     General Gait Details: Pt was able to tolerate ambulation ~ 15 ft with very poor posture and extremely slow gait cadence. distance limited by fatigue and slight SOB. sao2 88% on 5 L    Balance Overall balance assessment: Needs assistance Sitting-balance support: No upper extremity supported;Feet supported Sitting balance-Leahy Scale: Fair     Standing balance support: Bilateral upper extremity supported;During functional activity;Reliant on assistive device for balance Standing balance-Leahy Scale: Fair Standing balance comment: requires BUE support throughout standing activity      Cognition Arousal/Alertness: Awake/alert Behavior During Therapy: WFL for tasks assessed/performed Overall Cognitive Status: No family/caregiver present to determine baseline cognitive functioning  General Comments: A&Ox4. Requires increased processing time               Pertinent Vitals/Pain Pain Assessment: No/denies pain Pain Score: 0-No  pain Pain Location: back pain Pain Descriptors / Indicators: Aching Pain Intervention(s): Limited activity within patient's tolerance;Monitored during session;Repositioned     PT Goals (current goals can now be found in the care plan section) Acute Rehab PT Goals Patient Stated Goal: to go home Progress towards PT goals: Progressing toward goals    Frequency    Min 2X/week      PT Plan Current plan remains appropriate       AM-PAC PT "6 Clicks" Mobility   Outcome Measure  Help needed turning from your back to your side while in a flat bed without using bedrails?: A Little Help needed moving from lying on your back to sitting on the side of a flat bed without using bedrails?: A Little Help needed moving to and from a bed to a chair (including a wheelchair)?: A Little Help needed standing up from a chair using your arms (e.g., wheelchair or bedside chair)?: A Little Help needed to walk in hospital room?: A Little Help needed climbing 3-5 steps with a railing? : A Lot 6 Click Score: 17    End of Session Equipment Utilized During Treatment: Gait belt;Oxygen Activity Tolerance: Patient tolerated treatment well;Patient limited by fatigue Patient left: in bed;with call bell/phone within reach;with bed alarm set Nurse Communication: Mobility status PT Visit Diagnosis: Other abnormalities of gait and mobility (R26.89);Difficulty in walking, not elsewhere classified (R26.2);Muscle weakness (generalized) (M62.81)     Time: 1610-9604 PT Time Calculation (min) (ACUTE ONLY): 38 min  Charges:  $Gait Training: 8-22 mins $Therapeutic Activity: 23-37 mins                    Julaine Fusi PTA 06/20/21, 4:48 PM

## 2021-06-20 NOTE — Progress Notes (Addendum)
Progress Note  Patient Name: [name] Date of Encounter: 06/20/2021  Primary Cardiologist: Peter Martinique, MD  Subjective   Breathing slightly improved.  Remains on 5 L O2 (on 3 L at home).  Hopeful to get home soon but notes that she does not like working with physical therapy but understands that it will be important in order to be discharged.  Inpatient Medications    Scheduled Meds:  apixaban  5 mg Oral BID   citalopram  20 mg Oral Daily   digoxin  0.125 mg Oral Daily   docusate sodium  100 mg Oral BID   furosemide  40 mg Intravenous BID   gabapentin  100 mg Oral TID   levothyroxine  88 mcg Oral Daily   metoprolol succinate  25 mg Oral BID   metoprolol tartrate  12.5 mg Oral Q6H   nicotine  14 mg Transdermal Daily   sodium chloride flush  3 mL Intravenous Q12H   tiotropium  18 mcg Inhalation Daily   Continuous Infusions:  PRN Meds: acetaminophen **OR** acetaminophen, bisacodyl, hydrALAZINE, levalbuterol, ondansetron **OR** ondansetron (ZOFRAN) IV, oxyCODONE, polyethylene glycol, traZODone   Vital Signs    Vitals:   06/19/21 2302 06/20/21 0500 06/20/21 0532 06/20/21 0746  BP: 116/72  114/82 117/82  Pulse: 89  77 100  Resp: 18  20 18   Temp: 97.9 F (36.6 C)  97.7 F (36.5 C) 97.6 F (36.4 C)  TempSrc: Oral  Oral   SpO2: 94%  92% 91%  Weight:  88.1 kg    Height:        Intake/Output Summary (Last 24 hours) at 06/20/2021 1134 Last data filed at 06/20/2021 0535 Gross per 24 hour  Intake 600 ml  Output 750 ml  Net -150 ml   Filed Weights   06/17/21 1440 06/19/21 0438 06/20/21 0500  Weight: 93.3 kg 88.8 kg 88.1 kg    Physical Exam   GEN: Well nourished, well developed, in no acute distress.  HEENT: Grossly normal.  Neck: Supple, no JVD, carotid bruits, or masses. Cardiac: RRR, no murmurs, rubs, or gallops. No clubbing, cyanosis, trace woody lower extremity edema with chronic venous stasis changes.  Radials 2+, DP/PT 2+ and equal bilaterally.   Respiratory:  Respirations regular and unlabored, diminished breath sounds bilaterally. GI: Soft, nontender, nondistended, BS + x 4. MS: no deformity or atrophy. Skin: warm and dry, no rash. Neuro:  Strength and sensation are intact. Psych: AAOx3.  Normal affect.  Labs    Chemistry Recent Labs  Lab 06/17/21 1802 06/18/21 0328 06/19/21 0910 06/20/21 0643  NA 137 136 137 135  K 4.8 4.1 4.0 3.5  CL 105 102 98 99  CO2 24 27 31 29   GLUCOSE 113* 113* 113* 91  BUN 22 20 17 16   CREATININE 0.97 1.02* 0.78 0.68  CALCIUM 9.6 9.0 9.1 8.8*  PROT 7.1  --   --   --   ALBUMIN 3.6  --   --   --   AST 36  --   --   --   ALT 16  --   --   --   ALKPHOS 48  --   --   --   BILITOT 1.5*  --   --   --   GFRNONAA 58* 55* >60 >60  ANIONGAP 8 7 8 7      Hematology Recent Labs  Lab 06/17/21 1802 06/18/21 0328  WBC 8.5 7.0  RBC 4.50 4.63  HGB 15.2* 16.0*  HCT 46.4* 47.1*  MCV 103.1* 101.7*  MCH 33.8 34.6*  MCHC 32.8 34.0  RDW 13.6 13.6  PLT 145* 144*    Cardiac Enzymes  Recent Labs  Lab 06/17/21 1802 06/18/21 0122 06/18/21 0322 06/18/21 2157  TROPONINIHS 35* 41* 42* 31*      BNP Recent Labs  Lab 06/17/21 1802  BNP 363.5*     Radiology    CT HEAD WO CONTRAST (5MM)  Result Date: 06/17/2021 CLINICAL DATA:  Head trauma unwitnessed fall EXAM: CT HEAD WITHOUT CONTRAST TECHNIQUE: Contiguous axial images were obtained from the base of the skull through the vertex without intravenous contrast. COMPARISON:  CT brain 07/05/2014 FINDINGS: Brain: No acute territorial infarction, hemorrhage or intracranial mass. Extensive white matter hypodensity progressed compared to prior. Age indeterminate hypodensity in the right thalamus. Normal ventricle size. Mild atrophy. Vascular: No hyperdense vessels.  Carotid vascular calcification Skull: Normal. Negative for fracture or focal lesion. Sinuses/Orbits: Opacified left maxillary sinus. Other: None IMPRESSION: 1. No definite CT evidence for  acute intracranial abnormality. 2. Small age indeterminate hypodensity in the right thalamus which may reflect lacunar infarct or small vessel disease. Extensive white matter hypodensity consistent with chronic small vessel ischemic change, progressed compared to prior head CT. Electronically Signed   By: Donavan Foil M.D.   On: 06/17/2021 19:18   DG Chest Portable 1 View  Result Date: 06/17/2021 CLINICAL DATA:  Hypoxia EXAM: PORTABLE CHEST 1 VIEW COMPARISON:  May 05, 2021 FINDINGS: Enlarged cardiac silhouette, similar prior. Low lung volumes with bibasilar atelectasis. No significant interval change in the persistent bilateral interstitial thickening. No new focal consolidation. No visible pleural effusion or pneumothorax. The visualized skeletal structures are unchanged. IMPRESSION: 1. No significant interval change in the persistent bilateral interstitial thickening with superimposed atelectasis. 2. Stable cardiomegaly. Electronically Signed   By: Dahlia Bailiff M.D.   On: 06/17/2021 16:10   Telemetry    Afib, 80's to 90's - Personally Reviewed  Cardiac Studies   2D Echocardiogram 12.27.2022  1. Left ventricular ejection fraction, by estimation, is 35 to 40%. The  left ventricle has moderately decreased function. The left ventricle  demonstrates global hypokinesis. There is mild left ventricular  hypertrophy. Left ventricular diastolic function   could not be evaluated.   2. Right ventricular systolic function is moderately reduced. The right  ventricular size is normal. There is moderately elevated pulmonary artery  systolic pressure.   3. Left atrial size was moderately dilated.   4. Right atrial size was moderately dilated.   5. The mitral valve is degenerative. Mild mitral valve regurgitation. No  evidence of mitral stenosis. Severe mitral annular calcification.   6. Tricuspid valve regurgitation is moderate.   7. The aortic valve is tricuspid. There is mild thickening of the  aortic  valve. Aortic valve regurgitation is not visualized. Aortic valve  sclerosis is present, with no evidence of aortic valve stenosis.   8. The inferior vena cava is normal in size with greater than 50%  respiratory variability, suggesting right atrial pressure of 3 mmHg.   Patient Profile     80 y.o. female with history of persistent A. fib on apixaban, chronic combined systolic and diastolic CHF, chronic hypoxic respiratory failure on supplemental oxygen at 3 L via nasal cannula, COPD, pulmonary hypertension, DVT, non-small cell lung cancer status post chemoradiation, AAA status post aortic stent graft followed by vascular surgery, descending thoracic aortic aneurysm, HTN, HLD, hypothyroidism, osteoarthritis, OSA, who is being seen today for the evaluation of  A. fib with RVR at the request of Dr. Posey Pronto.  Assessment & Plan    1.  Acute on chronic hypoxic respiratory failure: Likely multifactorial in the setting of COPD and chronic combined heart failure.  She remains on 5 L oxygen (usually 3 L at home).  Breathing not quite back to baseline.  EF 35 to 40% by echo on the 27th, which is stable over time.  BNP was elevated at 363.5 on admission and she is currently on Lasix 40 mg IV twice daily (see below).  Nebulizer therapy per primary team.  2.  Acute on chronic HFrEF/nonischemic cardiomyopathy: As above, EF 35 to 40% by echo on December 27, which is stable.  BNP elevated at 363.5 on admission and she is currently on Lasix 40 mg IV twice daily.  She is -207 mL yesterday and -4.2 L since admission.  Renal function remained stable.  Body habitus makes evaluation of volume status difficult.  With ongoing increasing oxygen demands and stable renal function recommend continuing IV diuresis.  Consider SGLT2 inhibitor though given body habitus and sedentary status, may not be ideal candidate.  3.  Permanent atrial fibrillation with rapid ventricular response: In the setting of above, heart rate is  elevated on admission.  Home dose of beta-blocker has been doubled and she was loaded with intravenous digoxin yesterday.  Rates have been stable in the 80s to 90s.  We will transition her to Toprol-XL 25 mg twice daily to begin this evening.  She does have a history of bradycardia in the outpatient setting and this will need to be watched closely.  Continue Eliquis.  4.  Demand ischemia: Mild troponin elevation to a peak of 42 with flat trend in the setting of above.  It appears that she had a negative stress test in 2012.  Stable LV dysfunction by echo this admission.  We will arrange for outpatient follow-up with Dr. Martinique with consideration to be given to repeat noninvasive testing.  Continue beta-blocker therapy.  No aspirin in the setting of chronic Eliquis.  Signed, Murray Hodgkins, NP  06/20/2021, 11:34 AM    For questions or updates, please contact   Please consult [email or web address] for contact info under Cardiology/STEMI.

## 2021-06-20 NOTE — Progress Notes (Signed)
PROGRESS NOTE    Paz Fuentes  YKZ:[id number] DOB: 1941-05-05 DOA: 06/17/2021 PCP: Burnard Bunting, MD   Brief Narrative: [name] is a 80 y.o. female with a history of persistent atrial fibrillation, AAA, anxiety, depression, COPD, hypothyroidism, chronic combined systolic and diastolic heart failure. Patient presented secondary to altered mental status and dyspnea from ALF and found to have acute on chronic respiratory failure.   Assessment & Plan:   Principal Problem:   AMS (altered mental status) Active Problems:   COPD (chronic obstructive pulmonary disease) (HCC)   Hypothyroid   HTN (hypertension)   Atrial fibrillation with RVR (HCC)   Chronic combined systolic and diastolic CHF (congestive heart failure) (HCC)   Acute on chronic respiratory failure with hypoxia (HCC)   Acute on chronic respiratory failure Patient is on 3L/min of oxygen at baseline. Requiring up to 6 L/min of oxygen this admission. Unsure of etiology of acute worsening but possibly related to atelectasis. No evidence of infection or fluid on imaging. Patient has been on Lasix IV though which may be contributing to improvement. Down to 4 L/min -Wean to home 3 L/min  Persistent atrial fibrillation with RVR Cardiology consulted. Continued RVR. -Cardiology recommendations: metoprolol 12.5 mg QID, digoxin, Eliquis  AAA Stable  Hypothyroidism -Continue Synthroid  Generalized weakness PT recommending SNF.  Chronic combined systolic and diastolic heart failure Well compensated. EF of 35-40%. Started on Lasix IV while inpatient. UOP of 1.05 L over the last 24 hours. Weight of 93.3 kg on admission. Weight of 88.1 kg on 12/29 -Continue Lasix IV as long as creatinine can tolerate  COPD Appears stable and without exacerbation. -Continue Spiriva, Xopenex  Disorientation Resolved. Possibly secondary to hypoxia.   DVT prophylaxis: Eliquis Code Status:   Code Status: DNR Family  Communication: None at bedside Disposition Plan: Discharge to ALF vs SNF likely in 1-2 days pending ability to wean down oxygen to baseline and control of RVR with cardiology recommendations   Consultants:  Cardiology  Procedures:  TRANSTHORACIC ECHOCARDIOGRAM (06/18/2021) IMPRESSIONS     1. Left ventricular ejection fraction, by estimation, is 35 to 40%. The  left ventricle has moderately decreased function. The left ventricle  demonstrates global hypokinesis. There is mild left ventricular  hypertrophy. Left ventricular diastolic function   could not be evaluated.   2. Right ventricular systolic function is moderately reduced. The right  ventricular size is normal. There is moderately elevated pulmonary artery  systolic pressure.   3. Left atrial size was moderately dilated.   4. Right atrial size was moderately dilated.   5. The mitral valve is degenerative. Mild mitral valve regurgitation. No  evidence of mitral stenosis. Severe mitral annular calcification.   6. Tricuspid valve regurgitation is moderate.   7. The aortic valve is tricuspid. There is mild thickening of the aortic  valve. Aortic valve regurgitation is not visualized. Aortic valve  sclerosis is present, with no evidence of aortic valve stenosis.   8. The inferior vena cava is normal in size with greater than 50%  respiratory variability, suggesting right atrial pressure of 3 mmHg.  Antimicrobials: None    Subjective: No issues this morning. Feels much better. Dyspnea improved.  Objective: Vitals:   06/20/21 0500 06/20/21 0532 06/20/21 0746 06/20/21 1203  BP:  114/82 117/82 115/64  Pulse:  77 100 67  Resp:  20 18 18   Temp:  97.7 F (36.5 C) 97.6 F (36.4 C) 98 F (36.7 C)  TempSrc:  Oral  SpO2:  92% 91% 94%  Weight: 88.1 kg     Height:        Intake/Output Summary (Last 24 hours) at 06/20/2021 1310 Last data filed at 06/20/2021 0535 Gross per 24 hour  Intake 600 ml  Output 750 ml  Net -150  ml    Filed Weights   06/17/21 1440 06/19/21 0438 06/20/21 0500  Weight: 93.3 kg 88.8 kg 88.1 kg   Physical exam  General exam: Appears calm and comfortable Respiratory system: mid-lower lung field rales bilaterally. Respiratory effort normal. Cardiovascular system: S1 & S2 heard. No murmurs, rubs, gallops or clicks. Gastrointestinal system: Abdomen is nondistended, soft and nontender. No organomegaly or masses felt. Normal bowel sounds heard. Central nervous system: Alert and oriented. No focal neurological deficits. Musculoskeletal: Trace edema. No calf tenderness Skin: No cyanosis. BLE rubor Psychiatry: Judgement and insight appear normal. Mood & affect appropriate.    Data Reviewed: I have personally reviewed following labs and imaging studies  CBC Lab Results  Component Value Date   WBC 7.0 06/18/2021   RBC 4.63 06/18/2021   HGB 16.0 (H) 06/18/2021   HCT 47.1 (H) 06/18/2021   MCV 101.7 (H) 06/18/2021   MCH 34.6 (H) 06/18/2021   PLT 144 (L) 06/18/2021   MCHC 34.0 06/18/2021   RDW 13.6 06/18/2021   LYMPHSABS 1.2 05/04/2021   MONOABS 1.0 05/04/2021   EOSABS 0.0 05/04/2021   BASOSABS 0.0 33/29/5188     Last metabolic panel Lab Results  Component Value Date   NA 135 06/20/2021   K 3.5 06/20/2021   CL 99 06/20/2021   CO2 29 06/20/2021   BUN 16 06/20/2021   CREATININE 0.68 06/20/2021   GLUCOSE 91 06/20/2021   GFRNONAA >60 06/20/2021   GFRAA 61 07/21/2019   CALCIUM 8.8 (L) 06/20/2021   PHOS 3.5 01/21/2012   PROT 7.1 06/17/2021   ALBUMIN 3.6 06/17/2021   BILITOT 1.5 (H) 06/17/2021   ALKPHOS 48 06/17/2021   AST 36 06/17/2021   ALT 16 06/17/2021   ANIONGAP 7 06/20/2021    CBG (last 3)  No results for input(s): GLUCAP in the last 72 hours.   GFR: Estimated Creatinine Clearance: 60.6 mL/min (by C-G formula based on SCr of 0.68 mg/dL).  Coagulation Profile: No results for input(s): INR, PROTIME in the last 168 hours.  Recent Results (from the past 240  hour(s))  Resp Panel by RT-PCR (Flu A&B, Covid) Nasopharyngeal Swab     Status: None   Collection Time: 06/17/21  6:02 PM   Specimen: Nasopharyngeal Swab; Nasopharyngeal(NP) swabs in vial transport medium  Result Value Ref Range Status   SARS Coronavirus 2 by RT PCR NEGATIVE NEGATIVE Final    Comment: (NOTE) SARS-CoV-2 target nucleic acids are NOT DETECTED.  The SARS-CoV-2 RNA is generally detectable in upper respiratory specimens during the acute phase of infection. The lowest concentration of SARS-CoV-2 viral copies this assay can detect is 138 copies/mL. A negative result does not preclude SARS-Cov-2 infection and should not be used as the sole basis for treatment or other patient management decisions. A negative result may occur with  improper specimen collection/handling, submission of specimen other than nasopharyngeal swab, presence of viral mutation(s) within the areas targeted by this assay, and inadequate number of viral copies(<138 copies/mL). A negative result must be combined with clinical observations, patient history, and epidemiological information. The expected result is Negative.  Fact Sheet for Patients:  EntrepreneurPulse.com.au  Fact Sheet for Healthcare Providers:  IncredibleEmployment.be  This test is  no t yet approved or cleared by the [place] and  has been authorized for detection and/or diagnosis of SARS-CoV-2 by FDA under an Emergency Use Authorization (EUA). This EUA will remain  in effect (meaning this test can be used) for the duration of the COVID-19 declaration under Section 564(b)(1) of the Act, 21 U.S.C.section 360bbb-3(b)(1), unless the authorization is terminated  or revoked sooner.       Influenza A by PCR NEGATIVE NEGATIVE Final   Influenza B by PCR NEGATIVE NEGATIVE Final    Comment: (NOTE) The Xpert Xpress SARS-CoV-2/FLU/RSV plus assay is intended as an aid in the diagnosis of influenza from  Nasopharyngeal swab specimens and should not be used as a sole basis for treatment. Nasal washings and aspirates are unacceptable for Xpert Xpress SARS-CoV-2/FLU/RSV testing.  Fact Sheet for Patients: EntrepreneurPulse.com.au  Fact Sheet for Healthcare Providers: IncredibleEmployment.be  This test is not yet approved or cleared by the [place] FDA and has been authorized for detection and/or diagnosis of SARS-CoV-2 by FDA under an Emergency Use Authorization (EUA). This EUA will remain in effect (meaning this test can be used) for the duration of the COVID-19 declaration under Section 564(b)(1) of the Act, 21 U.S.C. section 360bbb-3(b)(1), unless the authorization is terminated or revoked.  Performed at [facility], [address] [place], [place] [address]   MRSA Next Gen by PCR, Nasal     Status: None   Collection Time: 06/19/21  4:47 AM   Specimen: Nasal Mucosa; Nasal Swab  Result Value Ref Range Status   MRSA by PCR Next Gen NOT DETECTED NOT DETECTED Final    Comment: (NOTE) The GeneXpert MRSA Assay (FDA approved for NASAL specimens only), is one component of a comprehensive MRSA colonization surveillance program. It is not intended to diagnose MRSA infection nor to guide or monitor treatment for MRSA infections. Test performance is not FDA approved in patients less than 77 years old. Performed at [facility] [address], [place], [place] [address]         Radiology Studies: No results found.      Scheduled Meds:  apixaban  5 mg Oral BID   citalopram  20 mg Oral Daily   digoxin  0.125 mg Oral Daily   docusate sodium  100 mg Oral BID   furosemide  40 mg Intravenous BID   gabapentin  100 mg Oral TID   levothyroxine  88 mcg Oral Daily   metoprolol succinate  25 mg Oral BID   metoprolol tartrate  12.5 mg Oral Q6H   nicotine  14 mg Transdermal Daily   sodium chloride flush  3 mL Intravenous  Q12H   tiotropium  18 mcg Inhalation Daily   Continuous Infusions:   LOS: 3 days     Cordelia Poche, MD [facility] 06/20/2021, 1:10 PM  If 7PM-7AM, please contact night-coverage [email or web address]

## 2021-06-21 DIAGNOSIS — R410 Disorientation, unspecified: Secondary | ICD-10-CM | POA: Diagnosis not present

## 2021-06-21 DIAGNOSIS — I4819 Other persistent atrial fibrillation: Secondary | ICD-10-CM | POA: Diagnosis not present

## 2021-06-21 DIAGNOSIS — J9621 Acute and chronic respiratory failure with hypoxia: Secondary | ICD-10-CM | POA: Diagnosis not present

## 2021-06-21 DIAGNOSIS — I4891 Unspecified atrial fibrillation: Secondary | ICD-10-CM | POA: Diagnosis not present

## 2021-06-21 DIAGNOSIS — I5042 Chronic combined systolic (congestive) and diastolic (congestive) heart failure: Secondary | ICD-10-CM | POA: Diagnosis not present

## 2021-06-21 LAB — BASIC METABOLIC PANEL
Anion gap: 8 (ref 5–15)
BUN: 18 mg/dL (ref 8–23)
CO2: 31 mmol/L (ref 22–32)
Calcium: 9.1 mg/dL (ref 8.9–10.3)
Chloride: 98 mmol/L (ref 98–111)
Creatinine, Ser: 0.8 mg/dL (ref 0.44–1.00)
GFR, Estimated: >60 mL/min (ref >60)
Glucose, Bld: 94 mg/dL (ref 70–99)
Potassium: 4.4 mmol/L (ref 3.5–5.1)
Sodium: 137 mmol/L (ref 135–145)

## 2021-06-21 LAB — CBC
HCT: 49.8 % — ABNORMAL HIGH (ref 36.0–46.0)
Hemoglobin: 16.7 g/dL — ABNORMAL HIGH (ref 12.0–15.0)
MCH: 34.6 pg — ABNORMAL HIGH (ref 26.0–34.0)
MCHC: 33.5 g/dL (ref 30.0–36.0)
MCV: 103.3 fL — ABNORMAL HIGH (ref 80.0–100.0)
Platelets: 134 10*3/uL — ABNORMAL LOW (ref 150–400)
RBC: 4.82 MIL/uL (ref 3.87–5.11)
RDW: 13.4 % (ref 11.5–15.5)
WBC: 6.1 10*3/uL (ref 4.0–10.5)
nRBC: 0 % (ref 0.0–0.2)

## 2021-06-21 LAB — DIGOXIN LEVEL: Digoxin Level: 1.4 ng/mL (ref 0.8–2.0)

## 2021-06-21 MED ORDER — DIGOXIN 125 MCG PO TABS
0.0625 mg | ORAL_TABLET | Freq: Every day | ORAL | Status: DC
  Filled 2021-06-21: qty 0.5

## 2021-06-21 MED ORDER — FUROSEMIDE 40 MG PO TABS
80.0000 mg | ORAL_TABLET | Freq: Every day | ORAL | Status: DC
  Administered 2021-06-22 – 2021-06-25 (×4): 80 mg via ORAL
  Filled 2021-06-21 (×4): qty 2

## 2021-06-21 NOTE — Progress Notes (Signed)
Physical Therapy Treatment Patient Details Name: [name] MRN: [id number] DOB: [birth year] Today's Date: 06/21/2021   History of Present Illness Pt is a 80 y.o. female seen in ed with complaints of AMS and fall today , pt slid out of bed, no head injury. Per EMS pt in Af[facility] with RVR. PMH of cOPD, DVT on Eliquis, A. fib on Eliquis, hypertension, hyperlipidemia, congestive heart failure, chronic O2 use at 3L baseline, non-small cell lung cancer status post chemoradiation, AAA status post aortic stent graft followed by vascular surgery, descending thoracic aortic aneurysm.    PT Comments    Pt was long long sitting in bed, somewhat lethargic however does become more alert throughout session. She was issued morphine to decrease L shoulder pain. Pt was on 4L o2 throughout session with sao2 > 91%. She was able to exit L side of bed, stand to RW, and ambulate ~ 15 ft without LOB. Pt is very limited by activity tolerance. HR elevated to 140s (A-fib) and with slight SOB noted. She wa son 3 L o2 at baseline. PT recommends DC to STR to improve activity tolerance and independence with ADLs.    Recommendations for follow up therapy are one component of a multi-disciplinary discharge planning process, led by the attending physician.  Recommendations may be updated based on patient status, additional functional criteria and insurance authorization.  Follow Up Recommendations  Skilled nursing-short term rehab (<3 hours/day)     Assistance Recommended at Discharge Frequent or constant Supervision/Assistance  Equipment Recommendations  BSC/3in1;Rollator (4 wheels)       Precautions / Restrictions Precautions Precautions: Fall Precaution Comments: watch HR, O2 ( A fib) Restrictions Weight Bearing Restrictions: No     Mobility  Bed Mobility Overal bed mobility: Needs Assistance Bed Mobility: Supine to Sit     Supine to sit: Min assist;HOB elevated          Transfers Overall transfer  level: Needs assistance Equipment used: Rolling walker (2 wheels) Transfers: Sit to/from Stand Sit to Stand: Min guard           General transfer comment: CGA for safety with vcs for improved technique and sequencing    Ambulation/Gait Ambulation/Gait assistance: Min guard Gait Distance (Feet): 15 Feet Assistive device: Rolling walker (2 wheels) Gait Pattern/deviations: Step-through pattern;Trunk flexed Gait velocity: decreased     General Gait Details: pt was able to ambulate to 15 ft around room without LOB however is slightly SOB and does endorse fatigue with minimal activity.     Balance Overall balance assessment: Needs assistance Sitting-balance support: No upper extremity supported;Feet supported Sitting balance-Leahy Scale: Fair     Standing balance support: Bilateral upper extremity supported;During functional activity;Reliant on assistive device for balance Standing balance-Leahy Scale: Fair      Cognition Arousal/Alertness: Awake/alert Behavior During Therapy: WFL for tasks assessed/performed Overall Cognitive Status: No family/caregiver present to determine baseline cognitive functioning      General Comments: A&Ox4. Requires increased processing time               Pertinent Vitals/Pain Pain Assessment: 0-10 Pain Score: 4  Pain Location: L shoulder pain- was given morphine for pain recently Pain Descriptors / Indicators: Discomfort Pain Intervention(s): Limited activity within patient's tolerance;Monitored during session;Premedicated before session;Repositioned     PT Goals (current goals can now be found in the care plan section) Acute Rehab PT Goals Patient Stated Goal: to go home Progress towards PT goals: Progressing toward goals    Frequency  Min 2X/week      PT Plan Current plan remains appropriate       AM-PAC PT "6 Clicks" Mobility   Outcome Measure  Help needed turning from your back to your side while in a flat bed  without using bedrails?: A Little Help needed moving from lying on your back to sitting on the side of a flat bed without using bedrails?: A Little Help needed moving to and from a bed to a chair (including a wheelchair)?: A Little Help needed standing up from a chair using your arms (e.g., wheelchair or bedside chair)?: A Little Help needed to walk in hospital room?: A Lot   6 Click Score: 14    End of Session Equipment Utilized During Treatment: Gait belt;Oxygen Activity Tolerance: Patient tolerated treatment well;Patient limited by fatigue Patient left: in bed;with call bell/phone within reach;with bed alarm set Nurse Communication: Mobility status PT Visit Diagnosis: Other abnormalities of gait and mobility (R26.89);Difficulty in walking, not elsewhere classified (R26.2);Muscle weakness (generalized) (M62.81)     Time: 6568-1275 PT Time Calculation (min) (ACUTE ONLY): 15 min  Charges:  $Therapeutic Activity: 8-22 mins                     Julaine Fusi PTA 06/21/21, 11:39 AM

## 2021-06-21 NOTE — NC FL2 (Signed)
[place] LEVEL OF CARE SCREENING TOOL     IDENTIFICATION  Patient Name: [name] Birthdate: 1940/08/02 Sex: female Admission Date (Current Location): 06/17/2021  [facility] and [place] Number:  Engineering geologist and Address:  [facility], [address], [place], [place] [address]      Provider Number: [id number]  Attending Physician Name and Address:  Mariel Aloe, MD  Relative Name and Phone Number:  Anne Ng (daughter) [phone]    Current Level of Care: Hospital Recommended Level of Care: [place] Prior Approval Number:    Date Approved/Denied:   PASRR Number: [id number]  Discharge Plan: SNF    Current Diagnoses: Patient Active Problem List   Diagnosis Date Noted   AMS (altered mental status) 06/17/2021   Acute on chronic respiratory failure with hypoxia ([place]) 35/32/9924   Acute metabolic encephalopathy 26/83/4196   UTI (urinary tract infection)    Severe sepsis ([place])    Anticoagulant long-term use    Physical deconditioning 06/16/2019   Hypokalemia    Chronic combined systolic and diastolic CHF (congestive heart failure) ([place]) 09/15/2016   Hyperlipidemia    Chronic respiratory failure with hypoxia ([place]) 12/03/2014   Insomnia, chronic 01/27/2014   Atrial fibrillation with RVR (HCC)    Multiple thyroid nodules 10/17/2012   AAA (abdominal aortic aneurysm) 05/11/2012   Bradycardia 01/23/2012   Hypothyroid 01/21/2012   HTN (hypertension) 01/21/2012   Edema 11/19/2011   Atrial flutter ([place]) 11/20/2010   Obstructive sleep apnea 10/16/2008   DVT 08/19/2008   COPD (chronic obstructive pulmonary disease) ([place]) 08/19/2008   NEOPLASM, MALIGNANT, LUNG, HX OF 22/29/7989   Diastolic heart failure, NYHA class 2 ([place]) 08/03/2008   Small cell carcinoma of lung ([place]) 06/23/2002   Hyperbilirubinemia 2004    Orientation RESPIRATION BLADDER Height & Weight     Time, Self, Situation, Place  O2 (3L  nasal cannula) Incontinent, External catheter Weight: 192 lb 3.9 oz (87.2 kg) Height:  5\' 6"  (167.6 cm)  BEHAVIORAL SYMPTOMS/MOOD NEUROLOGICAL BOWEL NUTRITION STATUS      Continent Diet (see discharge summary)  AMBULATORY STATUS COMMUNICATION OF NEEDS Skin   Limited Assist Verbally Other (Comment) (cellulitis leg)                       Personal Care Assistance Level of Assistance  Bathing, Feeding, Dressing, Total care Bathing Assistance: Limited assistance Feeding assistance: Independent Dressing Assistance: Limited assistance Total Care Assistance: Limited assistance   Functional Limitations Info  Sight, Hearing, Speech Sight Info: Adequate Hearing Info: Adequate Speech Info: Adequate    SPECIAL CARE FACTORS FREQUENCY  PT (By licensed PT), OT (By licensed OT)     PT Frequency: min 4x weekly OT Frequency: min 4x weekly            Contractures Contractures Info: Not present    Additional Factors Info  Code Status, Allergies, Isolation Precautions Code Status Info: DNR Allergies Info: No Known Allergies     Isolation Precautions Info: ESBL     Current Medications (06/21/2021):  This is the current hospital active medication list Current Facility-Administered Medications  Medication Dose Route Frequency Provider Last Rate Last Admin   acetaminophen (TYLENOL) tablet 650 mg  650 mg Oral Q6H PRN Para Skeans, MD   650 mg at 06/18/21 1845   Or   acetaminophen (TYLENOL) suppository 650 mg  650 mg Rectal Q6H PRN Para Skeans, MD       apixaban (  ELIQUIS) tablet 5 mg  5 mg Oral BID Para Skeans, MD   5 mg at 06/21/21 0920   bisacodyl (DULCOLAX) EC tablet 5 mg  5 mg Oral Daily PRN Para Skeans, MD       citalopram (CELEXA) tablet 20 mg  20 mg Oral Daily Para Skeans, MD   20 mg at 06/21/21 0920   [START ON 06/22/2021] digoxin (LANOXIN) tablet 0.0625 mg  0.0625 mg Oral Daily Theora Gianotti, NP       docusate sodium (COLACE) capsule 100 mg  100 mg Oral BID  Florina Ou V, MD   100 mg at 06/21/21 0920   furosemide (LASIX) injection 40 mg  40 mg Intravenous BID Para Skeans, MD   40 mg at 06/21/21 0919   gabapentin (NEURONTIN) capsule 100 mg  100 mg Oral TID Para Skeans, MD   100 mg at 06/21/21 0920   hydrALAZINE (APRESOLINE) injection 5 mg  5 mg Intravenous Q4H PRN Para Skeans, MD       levalbuterol Penne Lash) nebulizer solution 1.25 mg  1.25 mg Nebulization Q6H PRN Fritzi Mandes, MD       levothyroxine (SYNTHROID) tablet 88 mcg  88 mcg Oral Daily Para Skeans, MD   88 mcg at 06/21/21 0510   metoprolol succinate (TOPROL-XL) 24 hr tablet 25 mg  25 mg Oral BID Theora Gianotti, NP   25 mg at 06/21/21 0920   morphine (MSIR) tablet 15 mg  15 mg Oral Q8H PRN Mariel Aloe, MD   15 mg at 06/21/21 1607   nicotine (NICODERM CQ - dosed in mg/24 hours) patch 14 mg  14 mg Transdermal Daily Para Skeans, MD       ondansetron (ZOFRAN) tablet 4 mg  4 mg Oral Q6H PRN Para Skeans, MD       Or   ondansetron (ZOFRAN) injection 4 mg  4 mg Intravenous Q6H PRN Para Skeans, MD       polyethylene glycol (MIRALAX / GLYCOLAX) packet 17 g  17 g Oral Daily PRN Para Skeans, MD       sodium chloride flush (NS) 0.9 % injection 3 mL  3 mL Intravenous Q12H Florina Ou V, MD   3 mL at 06/21/21 0927   tiotropium [facility]) inhalation capsule ([facility] use ONLY) 18 mcg  18 mcg Inhalation Daily Para Skeans, MD   18 mcg at 06/21/21 3710   traZODone (DESYREL) tablet 25 mg  25 mg Oral QHS PRN Para Skeans, MD   25 mg at 06/20/21 2109     Discharge Medications: Please see discharge summary for a list of discharge medications.  Relevant Imaging Results:  Relevant Lab Results:   Additional Information GYI:[id number]  Alberteen Sam, LCSW

## 2021-06-21 NOTE — Progress Notes (Signed)
PROGRESS NOTE    [name]  RDE:[id number] DOB: Aug 15, 1940 DOA: 06/17/2021 PCP: Burnard Bunting, MD   Brief Narrative: [name] is a 80 y.o. female with a history of persistent atrial fibrillation, AAA, anxiety, depression, COPD, hypothyroidism, chronic combined systolic and diastolic heart failure. Patient presented secondary to altered mental status and dyspnea from ALF and found to have acute on chronic respiratory failure.   Assessment & Plan:   Principal Problem:   AMS (altered mental status) Active Problems:   COPD (chronic obstructive pulmonary disease) (HCC)   Hypothyroid   HTN (hypertension)   Atrial fibrillation with RVR (HCC)   Chronic combined systolic and diastolic CHF (congestive heart failure) (HCC)   Acute on chronic respiratory failure with hypoxia (HCC)   Acute on chronic respiratory failure Patient is on 3L/min of oxygen at baseline. Requiring up to 6 L/min of oxygen this admission. Unsure of etiology of acute worsening but possibly related to atelectasis. No evidence of infection or fluid on imaging. Patient has been on Lasix IV though which may be contributing to improvement. Down to 3 L/min. -Wean to home 3 L/min  Persistent atrial fibrillation with RVR Cardiology consulted. RVR resolved. -Cardiology recommendations: metoprolol 12.5 mg QID, digoxin 62.5 mcg daily, Eliquis  AAA Stable  Hypothyroidism -Continue Synthroid  Generalized weakness PT recommending SNF.  Chronic combined systolic and diastolic heart failure Well compensated. EF of 35-40%. Started on Lasix IV while inpatient. UOP of 1.55 L over the last 24 hours. Weight of 93.3 kg on admission. Weight of 87.2 kg on 12/30. Slight increase in creatinine -Transition to oral Lasix for tomorrow  COPD Appears stable and without exacerbation. -Continue Spiriva, Xopenex  Disorientation Resolved. Possibly secondary to hypoxia.   DVT prophylaxis: Eliquis Code Status:   Code  Status: DNR Family Communication: None at bedside Disposition Plan: Discharge to SNF once bed is available. Medically stable for discharge   Consultants:  Cardiology  Procedures:  TRANSTHORACIC ECHOCARDIOGRAM (06/18/2021) IMPRESSIONS     1. Left ventricular ejection fraction, by estimation, is 35 to 40%. The  left ventricle has moderately decreased function. The left ventricle  demonstrates global hypokinesis. There is mild left ventricular  hypertrophy. Left ventricular diastolic function   could not be evaluated.   2. Right ventricular systolic function is moderately reduced. The right  ventricular size is normal. There is moderately elevated pulmonary artery  systolic pressure.   3. Left atrial size was moderately dilated.   4. Right atrial size was moderately dilated.   5. The mitral valve is degenerative. Mild mitral valve regurgitation. No  evidence of mitral stenosis. Severe mitral annular calcification.   6. Tricuspid valve regurgitation is moderate.   7. The aortic valve is tricuspid. There is mild thickening of the aortic  valve. Aortic valve regurgitation is not visualized. Aortic valve  sclerosis is present, with no evidence of aortic valve stenosis.   8. The inferior vena cava is normal in size with greater than 50%  respiratory variability, suggesting right atrial pressure of 3 mmHg.  Antimicrobials: None    Subjective: No concerns today. Continues to feel better. Sad that her ALF won't take her back prior to receiving SNF level care  Objective: Vitals:   06/21/21 0509 06/21/21 0745 06/21/21 1157 06/21/21 1702  BP:  112/74 111/75 115/70  Pulse:  72 (!) 54 70  Resp:  15 16 18   Temp:  98.1 F (36.7 C) 97.9 F (36.6 C) 98.1 F (36.7 C)  TempSrc:  SpO2:  92% 92%   Weight: 87.2 kg     Height:        Intake/Output Summary (Last 24 hours) at 06/21/2021 1727 Last data filed at 06/21/2021 1700 Gross per 24 hour  Intake 120 ml  Output 1800 ml  Net  -1680 ml    Filed Weights   06/19/21 0438 06/20/21 0500 06/21/21 0509  Weight: 88.8 kg 88.1 kg 87.2 kg   Physical exam  General exam: Appears calm and comfortable Respiratory system: Clear to auscultation. Respiratory effort normal. Cardiovascular system: S1 & S2 heard, irregular rhythm, normal rate. No murmurs, rubs, gallops or clicks. Gastrointestinal system: Abdomen is nondistended, soft and nontender. No organomegaly or masses felt. Normal bowel sounds heard. Central nervous system: Alert and oriented. No focal neurological deficits. Musculoskeletal: No edema. No calf tenderness Skin: No cyanosis. Psychiatry: Judgement and insight appear normal. Mood & affect appropriate.     Data Reviewed: I have personally reviewed following labs and imaging studies  CBC Lab Results  Component Value Date   WBC 6.1 06/21/2021   RBC 4.82 06/21/2021   HGB 16.7 (H) 06/21/2021   HCT 49.8 (H) 06/21/2021   MCV 103.3 (H) 06/21/2021   MCH 34.6 (H) 06/21/2021   PLT 134 (L) 06/21/2021   MCHC 33.5 06/21/2021   RDW 13.4 06/21/2021   LYMPHSABS 1.2 05/04/2021   MONOABS 1.0 05/04/2021   EOSABS 0.0 05/04/2021   BASOSABS 0.0 80/99/8338     Last metabolic panel Lab Results  Component Value Date   NA 137 06/21/2021   K 4.4 06/21/2021   CL 98 06/21/2021   CO2 31 06/21/2021   BUN 18 06/21/2021   CREATININE 0.80 06/21/2021   GLUCOSE 94 06/21/2021   GFRNONAA >60 06/21/2021   GFRAA 61 07/21/2019   CALCIUM 9.1 06/21/2021   PHOS 3.5 01/21/2012   PROT 7.1 06/17/2021   ALBUMIN 3.6 06/17/2021   BILITOT 1.5 (H) 06/17/2021   ALKPHOS 48 06/17/2021   AST 36 06/17/2021   ALT 16 06/17/2021   ANIONGAP 8 06/21/2021    CBG (last 3)  No results for input(s): GLUCAP in the last 72 hours.   GFR: Estimated Creatinine Clearance: 60.3 mL/min (by C-G formula based on SCr of 0.8 mg/dL).  Coagulation Profile: No results for input(s): INR, PROTIME in the last 168 hours.  Recent Results (from the past 240  hour(s))  Resp Panel by RT-PCR (Flu A&B, Covid) Nasopharyngeal Swab     Status: None   Collection Time: 06/17/21  6:02 PM   Specimen: Nasopharyngeal Swab; Nasopharyngeal(NP) swabs in vial transport medium  Result Value Ref Range Status   SARS Coronavirus 2 by RT PCR NEGATIVE NEGATIVE Final    Comment: (NOTE) SARS-CoV-2 target nucleic acids are NOT DETECTED.  The SARS-CoV-2 RNA is generally detectable in upper respiratory specimens during the acute phase of infection. The lowest concentration of SARS-CoV-2 viral copies this assay can detect is 138 copies/mL. A negative result does not preclude SARS-Cov-2 infection and should not be used as the sole basis for treatment or other patient management decisions. A negative result may occur with  improper specimen collection/handling, submission of specimen other than nasopharyngeal swab, presence of viral mutation(s) within the areas targeted by this assay, and inadequate number of viral copies(<138 copies/mL). A negative result must be combined with clinical observations, patient history, and epidemiological information. The expected result is Negative.  Fact Sheet for Patients:  EntrepreneurPulse.com.au  Fact Sheet for Healthcare Providers:  IncredibleEmployment.be  This test is  no t yet approved or cleared by the [place] and  has been authorized for detection and/or diagnosis of SARS-CoV-2 by FDA under an Emergency Use Authorization (EUA). This EUA will remain  in effect (meaning this test can be used) for the duration of the COVID-19 declaration under Section 564(b)(1) of the Act, 21 U.S.C.section 360bbb-3(b)(1), unless the authorization is terminated  or revoked sooner.       Influenza A by PCR NEGATIVE NEGATIVE Final   Influenza B by PCR NEGATIVE NEGATIVE Final    Comment: (NOTE) The Xpert Xpress SARS-CoV-2/FLU/RSV plus assay is intended as an aid in the diagnosis of influenza from  Nasopharyngeal swab specimens and should not be used as a sole basis for treatment. Nasal washings and aspirates are unacceptable for Xpert Xpress SARS-CoV-2/FLU/RSV testing.  Fact Sheet for Patients: EntrepreneurPulse.com.au  Fact Sheet for Healthcare Providers: IncredibleEmployment.be  This test is not yet approved or cleared by the [place] FDA and has been authorized for detection and/or diagnosis of SARS-CoV-2 by FDA under an Emergency Use Authorization (EUA). This EUA will remain in effect (meaning this test can be used) for the duration of the COVID-19 declaration under Section 564(b)(1) of the Act, 21 U.S.C. section 360bbb-3(b)(1), unless the authorization is terminated or revoked.  Performed at [facility], [address] [place], [place] [address]   MRSA Next Gen by PCR, Nasal     Status: None   Collection Time: 06/19/21  4:47 AM   Specimen: Nasal Mucosa; Nasal Swab  Result Value Ref Range Status   MRSA by PCR Next Gen NOT DETECTED NOT DETECTED Final    Comment: (NOTE) The GeneXpert MRSA Assay (FDA approved for NASAL specimens only), is one component of a comprehensive MRSA colonization surveillance program. It is not intended to diagnose MRSA infection nor to guide or monitor treatment for MRSA infections. Test performance is not FDA approved in patients less than 23 years old. Performed at [facility], [address], [place], [place] [address]         Radiology Studies: No results found.      Scheduled Meds:  apixaban  5 mg Oral BID   citalopram  20 mg Oral Daily   [START ON 06/22/2021] digoxin  0.0625 mg Oral Daily   docusate sodium  100 mg Oral BID   [START ON 06/22/2021] furosemide  80 mg Oral Daily   gabapentin  100 mg Oral TID   levothyroxine  88 mcg Oral Daily   metoprolol succinate  25 mg Oral BID   nicotine  14 mg Transdermal Daily   sodium chloride flush  3 mL Intravenous  Q12H   tiotropium  18 mcg Inhalation Daily   Continuous Infusions:   LOS: 4 days     Cordelia Poche, MD [facility] 06/21/2021, 5:27 PM  If 7PM-7AM, please contact night-coverage [email or web address]

## 2021-06-21 NOTE — TOC Progression Note (Signed)
Transition of Care [facility]) - Progression Note    Patient Details  Name: [name] MRN: [id number] Date of Birth: 1941/03/12  Transition [facility]) CM/SW [place], [place] Phone Number: 06/21/2021, 12:06 PM  Clinical Narrative:     CSW spoke with patient at bedside regarding [facility] reporting they are unable to accept patient back as she needs SNF short term rehab.   Patient expressed understanding, tearful in that she was looking forward to going home and family visiting on January 5th. CSW informed her family can visit at [facility] as well. Patient requested CSW inform her daughter to call her. CSW spoke with daughter who is aware of above and will call patient as well.   CSW has sent out referrals for SNF pending bed offers at this time.     Expected Discharge Plan: Assisted Living Barriers to Discharge: Continued Medical Work up  Expected Discharge Plan and Services Expected Discharge Plan: Assisted Living       Living arrangements for the past 2 months: Assisted Living Facility                                       Social Determinants of Health (SDOH) Interventions    Readmission Risk Interventions No flowsheet data found.

## 2021-06-21 NOTE — Progress Notes (Signed)
Progress Note  Patient Name: [name] Date of Encounter: 06/21/2021  Primary Cardiologist: Peter Martinique, MD  Subjective   Stable this morning.  Sitting up in chair at bedside and notes some ongoing improvement in breathing.  Has worked with physical therapy with heart rate elevations into the 140s associated with dyspnea.  Currently recommendation is for short-term rehab at discharge.  Inpatient Medications    Scheduled Meds:  apixaban  5 mg Oral BID   citalopram  20 mg Oral Daily   [START ON 06/22/2021] digoxin  0.0625 mg Oral Daily   docusate sodium  100 mg Oral BID   furosemide  40 mg Intravenous BID   [START ON 06/22/2021] furosemide  80 mg Oral Daily   gabapentin  100 mg Oral TID   levothyroxine  88 mcg Oral Daily   metoprolol succinate  25 mg Oral BID   nicotine  14 mg Transdermal Daily   sodium chloride flush  3 mL Intravenous Q12H   tiotropium  18 mcg Inhalation Daily   Continuous Infusions:  PRN Meds: acetaminophen **OR** acetaminophen, bisacodyl, hydrALAZINE, levalbuterol, morphine, ondansetron **OR** ondansetron (ZOFRAN) IV, polyethylene glycol, traZODone   Vital Signs    Vitals:   06/21/21 0507 06/21/21 0509 06/21/21 0745 06/21/21 1157  BP: 109/69  112/74 111/75  Pulse: 100  72 (!) 54  Resp: 18  15 16   Temp: 98.2 F (36.8 C)  98.1 F (36.7 C) 97.9 F (36.6 C)  TempSrc:      SpO2: 93%  92% 92%  Weight:  87.2 kg    Height:        Intake/Output Summary (Last 24 hours) at 06/21/2021 1245 Last data filed at 06/21/2021 0500 Gross per 24 hour  Intake 120 ml  Output 1550 ml  Net -1430 ml   Filed Weights   06/19/21 0438 06/20/21 0500 06/21/21 0509  Weight: 88.8 kg 88.1 kg 87.2 kg    Physical Exam   GEN: Obese, in no acute distress.  HEENT: Grossly normal.  Neck: Supple, obese, difficult to gauge JVP.  No carotid bruits or masses. Cardiac: Irregularly irregular, no murmurs, rubs, or gallops. No clubbing, cyanosis, trace bilateral woody  lower extremity edema with discoloration associated with chronic venous stasis.  Radials 2+, DP/PT 1+ and equal bilaterally.  Respiratory:  Respirations regular and unlabored, diminished breath sounds bilaterally. GI: Obese, soft, nontender, nondistended, BS + x 4. MS: no deformity or atrophy. Skin: warm and dry, no rash. Neuro:  Strength and sensation are intact. Psych: AAOx3.  Normal affect.  Labs    Chemistry Recent Labs  Lab 06/17/21 1802 06/18/21 0328 06/19/21 0910 06/20/21 0643 06/21/21 0713  NA 137   < > 137 135 137  K 4.8   < > 4.0 3.5 4.4  CL 105   < > 98 99 98  CO2 24   < > 31 29 31   GLUCOSE 113*   < > 113* 91 94  BUN 22   < > 17 16 18   CREATININE 0.97   < > 0.78 0.68 0.80  CALCIUM 9.6   < > 9.1 8.8* 9.1  PROT 7.1  --   --   --   --   ALBUMIN 3.6  --   --   --   --   AST 36  --   --   --   --   ALT 16  --   --   --   --   ALKPHOS 48  --   --   --   --  BILITOT 1.5*  --   --   --   --   GFRNONAA 58*   < > >60 >60 >60  ANIONGAP 8   < > 8 7 8    < > = values in this interval not displayed.     Hematology Recent Labs  Lab 06/17/21 1802 06/18/21 0328 06/21/21 0713  WBC 8.5 7.0 6.1  RBC 4.50 4.63 4.82  HGB 15.2* 16.0* 16.7*  HCT 46.4* 47.1* 49.8*  MCV 103.1* 101.7* 103.3*  MCH 33.8 34.6* 34.6*  MCHC 32.8 34.0 33.5  RDW 13.6 13.6 13.4  PLT 145* 144* 134*    Cardiac Enzymes  Recent Labs  Lab 06/17/21 1802 06/18/21 0122 06/18/21 0322 06/18/21 2157  TROPONINIHS 35* 41* 42* 31*      BNP Recent Labs  Lab 06/17/21 1802  BNP 363.5*     Radiology    CT HEAD WO CONTRAST (5MM)  Result Date: 06/17/2021 CLINICAL DATA:  Head trauma unwitnessed fall EXAM: CT HEAD WITHOUT CONTRAST TECHNIQUE: Contiguous axial images were obtained from the base of the skull through the vertex without intravenous contrast. COMPARISON:  CT brain 07/05/2014 FINDINGS: Brain: No acute territorial infarction, hemorrhage or intracranial mass. Extensive white matter hypodensity  progressed compared to prior. Age indeterminate hypodensity in the right thalamus. Normal ventricle size. Mild atrophy. Vascular: No hyperdense vessels.  Carotid vascular calcification Skull: Normal. Negative for fracture or focal lesion. Sinuses/Orbits: Opacified left maxillary sinus. Other: None IMPRESSION: 1. No definite CT evidence for acute intracranial abnormality. 2. Small age indeterminate hypodensity in the right thalamus which may reflect lacunar infarct or small vessel disease. Extensive white matter hypodensity consistent with chronic small vessel ischemic change, progressed compared to prior head CT. Electronically Signed   By: Donavan Foil M.D.   On: 06/17/2021 19:18   DG Chest Portable 1 View  Result Date: 06/17/2021 CLINICAL DATA:  Hypoxia EXAM: PORTABLE CHEST 1 VIEW COMPARISON:  May 05, 2021 FINDINGS: Enlarged cardiac silhouette, similar prior. Low lung volumes with bibasilar atelectasis. No significant interval change in the persistent bilateral interstitial thickening. No new focal consolidation. No visible pleural effusion or pneumothorax. The visualized skeletal structures are unchanged. IMPRESSION: 1. No significant interval change in the persistent bilateral interstitial thickening with superimposed atelectasis. 2. Stable cardiomegaly. Electronically Signed   By: Dahlia Bailiff M.D.   On: 06/17/2021 16:10   Telemetry    Atrial fibrillation, 90s to low 100s, PVCs and rare couplets - Personally Reviewed  Cardiac Studies   2D Echocardiogram 12.27.2022   1. Left ventricular ejection fraction, by estimation, is 35 to 40%. The  left ventricle has moderately decreased function. The left ventricle  demonstrates global hypokinesis. There is mild left ventricular  hypertrophy. Left ventricular diastolic function   could not be evaluated.   2. Right ventricular systolic function is moderately reduced. The right  ventricular size is normal. There is moderately elevated pulmonary  artery  systolic pressure.   3. Left atrial size was moderately dilated.   4. Right atrial size was moderately dilated.   5. The mitral valve is degenerative. Mild mitral valve regurgitation. No  evidence of mitral stenosis. Severe mitral annular calcification.   6. Tricuspid valve regurgitation is moderate.   7. The aortic valve is tricuspid. There is mild thickening of the aortic  valve. Aortic valve regurgitation is not visualized. Aortic valve  sclerosis is present, with no evidence of aortic valve stenosis.   8. The inferior vena cava is normal in size with greater  than 50%  respiratory variability, suggesting right atrial pressure of 3 mmHg.   Patient Profile     80 y.o. female with history of persistent A. fib on apixaban, chronic combined systolic and diastolic CHF, chronic hypoxic respiratory failure on supplemental oxygen at 3 L via nasal cannula, COPD, pulmonary hypertension, DVT, non-small cell lung cancer status post chemoradiation, AAA status post aortic stent graft followed by vascular surgery, descending thoracic aortic aneurysm, HTN, HLD, hypothyroidism, osteoarthritis, OSA, who is being seen today for the evaluation of A. fib with RVR at the request of Dr. Posey Pronto.  Assessment & Plan    1.  Acute on chronic hypoxic respiratory failure: Multifactorial in the setting of COPD and chronic combined heart failure.  Currently on 4 L oxygen (usually 3 L at home).  Breathing improving but not quite back to baseline.  EF 35 to 40% by echo on the 27th, which is stable over time.  We will transition to oral Lasix tomorrow.  Nebulizer therapy per primary team.  2.  Acute on chronic heart failure with reduced ejection fraction/nonischemic cardiomyopathy: As above, EF 35 to 40% by echo on December 27, which is stable.  BNP was elevated on admission at 363.5.  She has been receiving intravenous Lasix at 40 mg twice daily with good response.  She was -1.4 L yesterday, though limited intake was  reported.  Weight is down to 87.2 kg, which appears to be in line with previous dry weights.  We will allow for an additional dose of IV Lasix tonight and transition to previous home dose of Lasix 80 mg p.o. daily beginning in the morning.  Continue long-acting metoprolol.  Blood pressures trending in the low 100s.  Transitioning to oral Lasix may allow for a little more blood pressure room and we can reassess in the morning and consider ARB.  Poor candidate for SGLT2 inhibitor.  3.  Permanent atrial fibrillation with rapid ventricular sponsor: Heart rate elevated on admission in the setting of above.  Rates have been trending in the 90s to low 100s at rest, but do elevate when working with physical therapy.  She is now on Toprol-XL 25 mg twice daily.  In the setting of an elevated digoxin level of 1.4 this morning, we have reduced her digoxin to 62.5 mcg daily.  Suspect elevated level is secondary to recent intravenous loading and note that she was previously on 125 mcg daily with digoxin level of 0.7 on admission.  Can likely resume prior home dose at discharge or shortly thereafter.  4.  Demand ischemia: Mild troponin elevation to a peak of 42 with flat trend in the setting of above.  Stable LV dysfunction by echo this admission.  Previous negative stress test in 2012.  We will arrange for outpatient follow-up with Dr. Martinique with consideration for repeat noninvasive testing at that time.  Continue beta-blocker.  No aspirin in the setting of chronic Eliquis.  Signed, Murray Hodgkins, NP  06/21/2021, 12:45 PM    For questions or updates, please contact   Please consult [email or web address] for contact info under Cardiology/STEMI.

## 2021-06-22 DIAGNOSIS — I4891 Unspecified atrial fibrillation: Secondary | ICD-10-CM | POA: Diagnosis not present

## 2021-06-22 DIAGNOSIS — R410 Disorientation, unspecified: Secondary | ICD-10-CM | POA: Diagnosis not present

## 2021-06-22 DIAGNOSIS — J9621 Acute and chronic respiratory failure with hypoxia: Secondary | ICD-10-CM | POA: Diagnosis not present

## 2021-06-22 DIAGNOSIS — I5042 Chronic combined systolic (congestive) and diastolic (congestive) heart failure: Secondary | ICD-10-CM | POA: Diagnosis not present

## 2021-06-22 LAB — BASIC METABOLIC PANEL
Anion gap: 6 (ref 5–15)
BUN: 21 mg/dL (ref 8–23)
CO2: 32 mmol/L (ref 22–32)
Calcium: 9.1 mg/dL (ref 8.9–10.3)
Chloride: 97 mmol/L — ABNORMAL LOW (ref 98–111)
Creatinine, Ser: 0.87 mg/dL (ref 0.44–1.00)
GFR, Estimated: >60 mL/min (ref >60)
Glucose, Bld: 101 mg/dL — ABNORMAL HIGH (ref 70–99)
Potassium: 3.8 mmol/L (ref 3.5–5.1)
Sodium: 135 mmol/L (ref 135–145)

## 2021-06-22 LAB — DIGOXIN LEVEL: Digoxin Level: 1 ng/mL (ref 0.8–2.0)

## 2021-06-22 MED ORDER — DIGOXIN 125 MCG PO TABS
0.1250 mg | ORAL_TABLET | Freq: Every day | ORAL | Status: DC
  Administered 2021-06-23 – 2021-06-25 (×3): 0.125 mg via ORAL
  Filled 2021-06-22 (×4): qty 1

## 2021-06-22 NOTE — Progress Notes (Signed)
PROGRESS NOTE    [name]  YPP:[id number] DOB: 11-24-1940 DOA: 06/17/2021 PCP: Burnard Bunting, MD   Brief Narrative: [name] is a 80 y.o. female with a history of persistent atrial fibrillation, AAA, anxiety, depression, COPD, hypothyroidism, chronic combined systolic and diastolic heart failure. Patient presented secondary to altered mental status and dyspnea from ALF and found to have acute on chronic respiratory failure.   Assessment & Plan:   Principal Problem:   AMS (altered mental status) Active Problems:   COPD (chronic obstructive pulmonary disease) (HCC)   Hypothyroid   HTN (hypertension)   Atrial fibrillation with RVR (HCC)   Chronic combined systolic and diastolic CHF (congestive heart failure) (HCC)   Acute on chronic respiratory failure with hypoxia (HCC)   Acute on chronic respiratory failure Patient is on 3L/min of oxygen at baseline. Requiring up to 6 L/min of oxygen this admission. Unsure of etiology of acute worsening but possibly related to atelectasis. No evidence of infection or fluid on imaging. Patient has been on Lasix IV though which may be contributing to improvement. Down to 3 L/min. -Continue oxygen 3 L/min  Persistent atrial fibrillation with RVR Cardiology consulted. RVR resolved. -Cardiology recommendations: metoprolol 12.5 mg QID, digoxin 62.5 mcg daily, Eliquis  AAA Stable  Hypothyroidism -Continue Synthroid  Generalized weakness PT recommending SNF.  Chronic combined systolic and diastolic heart failure Well compensated. EF of 35-40%. Started on Lasix IV while inpatient. UOP of 1.55 L over the last 24 hours. Weight of 93.3 kg on admission. Weight of 87.2 kg on 12/30. Slight increase in creatinine -Lasix PO  COPD Appears stable and without exacerbation. -Continue Spiriva, Xopenex  Disorientation Resolved. Possibly secondary to hypoxia.   DVT prophylaxis: Eliquis Code Status:   Code Status: DNR Family  Communication: None at bedside Disposition Plan: Discharge to SNF once bed is available. Medically stable for discharge   Consultants:  Cardiology  Procedures:  TRANSTHORACIC ECHOCARDIOGRAM (06/18/2021) IMPRESSIONS     1. Left ventricular ejection fraction, by estimation, is 35 to 40%. The  left ventricle has moderately decreased function. The left ventricle  demonstrates global hypokinesis. There is mild left ventricular  hypertrophy. Left ventricular diastolic function   could not be evaluated.   2. Right ventricular systolic function is moderately reduced. The right  ventricular size is normal. There is moderately elevated pulmonary artery  systolic pressure.   3. Left atrial size was moderately dilated.   4. Right atrial size was moderately dilated.   5. The mitral valve is degenerative. Mild mitral valve regurgitation. No  evidence of mitral stenosis. Severe mitral annular calcification.   6. Tricuspid valve regurgitation is moderate.   7. The aortic valve is tricuspid. There is mild thickening of the aortic  valve. Aortic valve regurgitation is not visualized. Aortic valve  sclerosis is present, with no evidence of aortic valve stenosis.   8. The inferior vena cava is normal in size with greater than 50%  respiratory variability, suggesting right atrial pressure of 3 mmHg.  Antimicrobials: None    Subjective: No issues overnight.  Objective: Vitals:   06/21/21 2311 06/22/21 0517 06/22/21 0534 06/22/21 0816  BP: (!) 129/96 121/81  115/81  Pulse: 68 96  82  Resp: 18 19  18   Temp: 98 F (36.7 C) (!) 97.5 F (36.4 C)  97.9 F (36.6 C)  TempSrc:      SpO2: 95% 92%  (!) 89%  Weight:   86.5 kg   Height:  Intake/Output Summary (Last 24 hours) at 06/22/2021 1104 Last data filed at 06/22/2021 0534 Gross per 24 hour  Intake --  Output 1200 ml  Net -1200 ml    Filed Weights   06/20/21 0500 06/21/21 0509 06/22/21 0534  Weight: 88.1 kg 87.2 kg 86.5 kg    Physical exam  General exam: Appears calm and comfortable Respiratory system: Rales at bases. Respiratory effort normal. Cardiovascular system: S1 & S2 heard, irregular rhythm with normal rate. No murmurs, rubs, gallops or clicks. Gastrointestinal system: Abdomen is nondistended, soft and nontender. No organomegaly or masses felt. Normal bowel sounds heard. Central nervous system: Alert and oriented. No focal neurological deficits. Musculoskeletal: No edema. No calf tenderness Skin: No cyanosis. No rashes Psychiatry: Judgement and insight appear normal. Mood & affect appropriate.    Data Reviewed: I have personally reviewed following labs and imaging studies  CBC Lab Results  Component Value Date   WBC 6.1 06/21/2021   RBC 4.82 06/21/2021   HGB 16.7 (H) 06/21/2021   HCT 49.8 (H) 06/21/2021   MCV 103.3 (H) 06/21/2021   MCH 34.6 (H) 06/21/2021   PLT 134 (L) 06/21/2021   MCHC 33.5 06/21/2021   RDW 13.4 06/21/2021   LYMPHSABS 1.2 05/04/2021   MONOABS 1.0 05/04/2021   EOSABS 0.0 05/04/2021   BASOSABS 0.0 17/61/6073     Last metabolic panel Lab Results  Component Value Date   NA 135 06/22/2021   K 3.8 06/22/2021   CL 97 (L) 06/22/2021   CO2 32 06/22/2021   BUN 21 06/22/2021   CREATININE 0.87 06/22/2021   GLUCOSE 101 (H) 06/22/2021   GFRNONAA >60 06/22/2021   GFRAA 61 07/21/2019   CALCIUM 9.1 06/22/2021   PHOS 3.5 01/21/2012   PROT 7.1 06/17/2021   ALBUMIN 3.6 06/17/2021   BILITOT 1.5 (H) 06/17/2021   ALKPHOS 48 06/17/2021   AST 36 06/17/2021   ALT 16 06/17/2021   ANIONGAP 6 06/22/2021    CBG (last 3)  No results for input(s): GLUCAP in the last 72 hours.   GFR: Estimated Creatinine Clearance: 55.3 mL/min (by C-G formula based on SCr of 0.87 mg/dL).  Coagulation Profile: No results for input(s): INR, PROTIME in the last 168 hours.  Recent Results (from the past 240 hour(s))  Resp Panel by RT-PCR (Flu A&B, Covid) Nasopharyngeal Swab     Status: None    Collection Time: 06/17/21  6:02 PM   Specimen: Nasopharyngeal Swab; Nasopharyngeal(NP) swabs in vial transport medium  Result Value Ref Range Status   SARS Coronavirus 2 by RT PCR NEGATIVE NEGATIVE Final    Comment: (NOTE) SARS-CoV-2 target nucleic acids are NOT DETECTED.  The SARS-CoV-2 RNA is generally detectable in upper respiratory specimens during the acute phase of infection. The lowest concentration of SARS-CoV-2 viral copies this assay can detect is 138 copies/mL. A negative result does not preclude SARS-Cov-2 infection and should not be used as the sole basis for treatment or other patient management decisions. A negative result may occur with  improper specimen collection/handling, submission of specimen other than nasopharyngeal swab, presence of viral mutation(s) within the areas targeted by this assay, and inadequate number of viral copies(<138 copies/mL). A negative result must be combined with clinical observations, patient history, and epidemiological information. The expected result is Negative.  Fact Sheet for Patients:  EntrepreneurPulse.com.au  Fact Sheet for Healthcare Providers:  IncredibleEmployment.be  This test is no t yet approved or cleared by the [place] FDA and  has been authorized for detection  and/or diagnosis of SARS-CoV-2 by FDA under an Emergency Use Authorization (EUA). This EUA will remain  in effect (meaning this test can be used) for the duration of the COVID-19 declaration under Section 564(b)(1) of the Act, 21 U.S.C.section 360bbb-3(b)(1), unless the authorization is terminated  or revoked sooner.       Influenza A by PCR NEGATIVE NEGATIVE Final   Influenza B by PCR NEGATIVE NEGATIVE Final    Comment: (NOTE) The Xpert Xpress SARS-CoV-2/FLU/RSV plus assay is intended as an aid in the diagnosis of influenza from Nasopharyngeal swab specimens and should not be used as a sole basis for treatment.  Nasal washings and aspirates are unacceptable for Xpert Xpress SARS-CoV-2/FLU/RSV testing.  Fact Sheet for Patients: EntrepreneurPulse.com.au  Fact Sheet for Healthcare Providers: IncredibleEmployment.be  This test is not yet approved or cleared by the [place] FDA and has been authorized for detection and/or diagnosis of SARS-CoV-2 by FDA under an Emergency Use Authorization (EUA). This EUA will remain in effect (meaning this test can be used) for the duration of the COVID-19 declaration under Section 564(b)(1) of the Act, 21 U.S.C. section 360bbb-3(b)(1), unless the authorization is terminated or revoked.  Performed at [facility], [address] [place], [place] [address]   MRSA Next Gen by PCR, Nasal     Status: None   Collection Time: 06/19/21  4:47 AM   Specimen: Nasal Mucosa; Nasal Swab  Result Value Ref Range Status   MRSA by PCR Next Gen NOT DETECTED NOT DETECTED Final    Comment: (NOTE) The GeneXpert MRSA Assay (FDA approved for NASAL specimens only), is one component of a comprehensive MRSA colonization surveillance program. It is not intended to diagnose MRSA infection nor to guide or monitor treatment for MRSA infections. Test performance is not FDA approved in patients less than 30 years old. Performed at [facility], [address], [place], [place] [address]         Radiology Studies: No results found.      Scheduled Meds:  apixaban  5 mg Oral BID   citalopram  20 mg Oral Daily   [START ON 06/23/2021] digoxin  0.125 mg Oral Daily   docusate sodium  100 mg Oral BID   furosemide  80 mg Oral Daily   gabapentin  100 mg Oral TID   levothyroxine  88 mcg Oral Daily   metoprolol succinate  25 mg Oral BID   nicotine  14 mg Transdermal Daily   sodium chloride flush  3 mL Intravenous Q12H   tiotropium  18 mcg Inhalation Daily   Continuous Infusions:   LOS: 5 days     Cordelia Poche,  MD [facility] 06/22/2021, 11:04 AM  If 7PM-7AM, please contact night-coverage [email or web address]

## 2021-06-22 NOTE — Progress Notes (Signed)
Progress Note  Patient Name: [name] Date of Encounter: 06/22/2021  Primary Cardiologist: Peter Martinique, MD  Subjective   Breathing stable.  Now back on p.o. Lasix.  Heart rates a little bit higher over the past 24 hours.  Inpatient Medications    Scheduled Meds:  apixaban  5 mg Oral BID   citalopram  20 mg Oral Daily   digoxin  0.0625 mg Oral Daily   docusate sodium  100 mg Oral BID   furosemide  80 mg Oral Daily   gabapentin  100 mg Oral TID   levothyroxine  88 mcg Oral Daily   metoprolol succinate  25 mg Oral BID   nicotine  14 mg Transdermal Daily   sodium chloride flush  3 mL Intravenous Q12H   tiotropium  18 mcg Inhalation Daily   Continuous Infusions:  PRN Meds: acetaminophen **OR** acetaminophen, bisacodyl, hydrALAZINE, levalbuterol, morphine, ondansetron **OR** ondansetron (ZOFRAN) IV, polyethylene glycol, traZODone   Vital Signs    Vitals:   06/21/21 2032 06/21/21 2311 06/22/21 0517 06/22/21 0534  BP: 120/88 (!) 129/96 121/81   Pulse: 88 68 96   Resp: 19 18 19    Temp: 98.2 F (36.8 C) 98 F (36.7 C) (!) 97.5 F (36.4 C)   TempSrc:      SpO2: 93% 95% 92%   Weight:    86.5 kg  Height:        Intake/Output Summary (Last 24 hours) at 06/22/2021 0802 Last data filed at 06/22/2021 0534 Gross per 24 hour  Intake --  Output 1200 ml  Net -1200 ml   Filed Weights   06/20/21 0500 06/21/21 0509 06/22/21 0534  Weight: 88.1 kg 87.2 kg 86.5 kg    Physical Exam   GEN: Obese, in no acute distress.  HEENT: Grossly normal.  Neck: Supple, no JVD, carotid bruits, or masses. Cardiac: Irregularly irregular, no murmurs, rubs, or gallops. No clubbing, cyanosis, trace lower extremity edema with evidence of chronic venous stasis.  Radials 2+, DP/PT 2+ and equal bilaterally.  Respiratory:  Respirations regular and unlabored, diminished breath sounds bilaterally. GI: Soft, nontender, nondistended, BS + x 4. MS: no deformity or atrophy. Skin: warm and dry,  no rash. Neuro:  Strength and sensation are intact. Psych: AAOx3.  Normal affect.  Labs    Chemistry Recent Labs  Lab 06/17/21 1802 06/18/21 0328 06/19/21 0910 06/20/21 0643 06/21/21 0713  NA 137   < > 137 135 137  K 4.8   < > 4.0 3.5 4.4  CL 105   < > 98 99 98  CO2 24   < > 31 29 31   GLUCOSE 113*   < > 113* 91 94  BUN 22   < > 17 16 18   CREATININE 0.97   < > 0.78 0.68 0.80  CALCIUM 9.6   < > 9.1 8.8* 9.1  PROT 7.1  --   --   --   --   ALBUMIN 3.6  --   --   --   --   AST 36  --   --   --   --   ALT 16  --   --   --   --   ALKPHOS 48  --   --   --   --   BILITOT 1.5*  --   --   --   --   GFRNONAA 58*   < > >60 >60 >60  ANIONGAP 8   < > 8  7 8   < > = values in this interval not displayed.     Hematology Recent Labs  Lab 06/17/21 1802 06/18/21 0328 06/21/21 0713  WBC 8.5 7.0 6.1  RBC 4.50 4.63 4.82  HGB 15.2* 16.0* 16.7*  HCT 46.4* 47.1* 49.8*  MCV 103.1* 101.7* 103.3*  MCH 33.8 34.6* 34.6*  MCHC 32.8 34.0 33.5  RDW 13.6 13.6 13.4  PLT 145* 144* 134*    Cardiac Enzymes  Recent Labs  Lab 06/17/21 1802 06/18/21 0122 06/18/21 0322 06/18/21 2157  TROPONINIHS 35* 41* 42* 31*      BNP Recent Labs  Lab 06/17/21 1802  BNP 363.5*   Radiology    -----------------  Telemetry    Atrial fibrillation, 80s to 90s with rises into the 120s- Personally Reviewed  Cardiac Studies   2D Echocardiogram 12.27.2022   1. Left ventricular ejection fraction, by estimation, is 35 to 40%. The  left ventricle has moderately decreased function. The left ventricle  demonstrates global hypokinesis. There is mild left ventricular  hypertrophy. Left ventricular diastolic function   could not be evaluated.   2. Right ventricular systolic function is moderately reduced. The right  ventricular size is normal. There is moderately elevated pulmonary artery  systolic pressure.   3. Left atrial size was moderately dilated.   4. Right atrial size was moderately dilated.   5.  The mitral valve is degenerative. Mild mitral valve regurgitation. No  evidence of mitral stenosis. Severe mitral annular calcification.   6. Tricuspid valve regurgitation is moderate.   7. The aortic valve is tricuspid. There is mild thickening of the aortic  valve. Aortic valve regurgitation is not visualized. Aortic valve  sclerosis is present, with no evidence of aortic valve stenosis.   8. The inferior vena cava is normal in size with greater than 50%  respiratory variability, suggesting right atrial pressure of 3 mmHg.   Patient Profile     80 y.o. female with history of persistent A. fib on apixaban, chronic combined systolic and diastolic CHF, chronic hypoxic respiratory failure on supplemental oxygen at 3 L via nasal cannula, COPD, pulmonary hypertension, DVT, non-small cell lung cancer status post chemoradiation, AAA status post aortic stent graft followed by vascular surgery, descending thoracic aortic aneurysm, HTN, HLD, hypothyroidism, osteoarthritis, OSA, who is being seen today for the evaluation of A. fib with RVR at the request of Dr. Posey Pronto.  Assessment & Plan    1.  Acute on chronic hypoxic respiratory failure: Multifactorial in the setting of COPD and chronic combined heart failure.  Remains on 4 L oxygen currently (usually on 3 L at home).  Patient notes breathing is stable.  EF 35 to 40% by echo on the 27th, which is stable over time.  Transitioned to oral Lasix this morning.  Nebulizer therapy per primary team.  2.  Acute on chronic heart failure with reduced ejection fraction/nonischemic cardiomyopathy:  As above, EF 35 to 40% by echo on December 27, which is stable.  BNP was elevated on admission at 363.5.  Transition to oral Lasix this morning.  Minus 1.2L yesterday and minus 6.8 L since admission, though intakes are not accurate.  Wt down to 86.5kg, which is lower than we have on record in the past >10 yrs. body habitus makes exam challenging but appears relatively  euvolemic.  Continue beta-blocker.  Pressures have been stable and will consider addition of ARB.  Poor candidate for SGLT2 inhibitor.  3.  Permanent atrial fibrillation with rapid ventricular  response: Heart rates elevated in the setting of above.  Now on Toprol-XL 25 mg twice daily.  Initially dropped digoxin dose but will increase back to prior home dose of 0.125 mg daily as rates higher yesterday.  She is anticoagulated with Eliquis.  4.  Demand ischemia:  Mild troponin elevation to a peak of 42 with flat trend in the setting of above.  Stable LV dysfunction by echo this admission.  Previous negative stress test in 2012.  We will arrange for outpatient follow-up with Dr. Martinique with consideration for repeat noninvasive testing at that time.  Continue beta-blocker.  No aspirin in the setting of chronic Eliquis.  Signed, Murray Hodgkins, NP  06/22/2021, 8:02 AM    For questions or updates, please contact   Please consult [email or web address] for contact info under Cardiology/STEMI.

## 2021-06-23 DIAGNOSIS — I5042 Chronic combined systolic (congestive) and diastolic (congestive) heart failure: Secondary | ICD-10-CM | POA: Diagnosis not present

## 2021-06-23 DIAGNOSIS — I4891 Unspecified atrial fibrillation: Secondary | ICD-10-CM | POA: Diagnosis not present

## 2021-06-23 DIAGNOSIS — J9621 Acute and chronic respiratory failure with hypoxia: Secondary | ICD-10-CM | POA: Diagnosis not present

## 2021-06-23 DIAGNOSIS — R410 Disorientation, unspecified: Secondary | ICD-10-CM | POA: Diagnosis not present

## 2021-06-23 MED ORDER — LOSARTAN POTASSIUM 25 MG PO TABS
12.5000 mg | ORAL_TABLET | Freq: Every day | ORAL | Status: DC
  Administered 2021-06-23 – 2021-06-24 (×2): 12.5 mg via ORAL
  Filled 2021-06-23 (×2): qty 1

## 2021-06-23 NOTE — Assessment & Plan Note (Signed)
Appears euvolemic - Continue furosemide, losartan, metoprolol

## 2021-06-23 NOTE — Assessment & Plan Note (Signed)
No wheezing.  Severe disease at baseline, 2018 PFT showed FEV1 43% predicted. - Continue LAMA

## 2021-06-23 NOTE — Assessment & Plan Note (Signed)
Blood pressure controlled - Continue furosemide, losartan, metoprolol

## 2021-06-23 NOTE — Assessment & Plan Note (Signed)
At baseline, mentation is normal without memory loss.  Presented with confusion. Due to respiratory failure.  Now resolved.

## 2021-06-23 NOTE — Progress Notes (Signed)
Progress Note  Patient Name: [name] Date of Encounter: 06/23/2021  [facility] Cardiologist: Peter Martinique, MD   Subjective   No acute events overnight.  Still waiting for placement for short-term rehab/SNF facility.  Denies palpitations.  Inpatient Medications    Scheduled Meds:  apixaban  5 mg Oral BID   citalopram  20 mg Oral Daily   digoxin  0.125 mg Oral Daily   docusate sodium  100 mg Oral BID   furosemide  80 mg Oral Daily   gabapentin  100 mg Oral TID   levothyroxine  88 mcg Oral Daily   metoprolol succinate  25 mg Oral BID   nicotine  14 mg Transdermal Daily   sodium chloride flush  3 mL Intravenous Q12H   tiotropium  18 mcg Inhalation Daily   Continuous Infusions:  PRN Meds: acetaminophen **OR** acetaminophen, bisacodyl, hydrALAZINE, levalbuterol, morphine, ondansetron **OR** ondansetron (ZOFRAN) IV, polyethylene glycol, traZODone   Vital Signs    Vitals:   06/23/21 0110 06/23/21 0500 06/23/21 0547 06/23/21 0813  BP: 109/83  108/70 111/79  Pulse: (!) 108  99 (!) 109  Resp: 19  20 18   Temp: 97.7 F (36.5 C)  97.7 F (36.5 C) 98.1 F (36.7 C)  TempSrc:   Oral Oral  SpO2: (!) 89%  92% 94%  Weight:  89.1 kg    Height:        Intake/Output Summary (Last 24 hours) at 06/23/2021 1400 Last data filed at 06/23/2021 0546 Gross per 24 hour  Intake --  Output 525 ml  Net -525 ml   Last 3 Weights 06/23/2021 06/22/2021 06/21/2021  Weight (lbs) 196 lb 6.9 oz 190 lb 11.2 oz 192 lb 3.9 oz  Weight (kg) 89.1 kg 86.5 kg 87.2 kg      Telemetry    Atrial fibrillation, heart rate 88-101- Personally Reviewed  ECG     - Personally Reviewed  Physical Exam   GEN: No acute distress.   Neck: No JVD Cardiac: Irregular irregular Respiratory: Clear to auscultation bilaterally. GI: Soft, nontender, non-distended  MS: No edema; No deformity. Neuro:  Nonfocal  Psych: Normal affect   Labs    High Sensitivity Troponin:   Recent Labs  Lab 06/17/21 1802  06/18/21 0122 06/18/21 0322 06/18/21 2157  TROPONINIHS 35* 41* 42* 31*     Chemistry Recent Labs  Lab 06/17/21 1802 06/18/21 0328 06/20/21 0643 06/21/21 0713 06/22/21 1032  NA 137   < > 135 137 135  K 4.8   < > 3.5 4.4 3.8  CL 105   < > 99 98 97*  CO2 24   < > 29 31 32  GLUCOSE 113*   < > 91 94 101*  BUN 22   < > 16 18 21   CREATININE 0.97   < > 0.68 0.80 0.87  CALCIUM 9.6   < > 8.8* 9.1 9.1  PROT 7.1  --   --   --   --   ALBUMIN 3.6  --   --   --   --   AST 36  --   --   --   --   ALT 16  --   --   --   --   ALKPHOS 48  --   --   --   --   BILITOT 1.5*  --   --   --   --   GFRNONAA 58*   < > >60 >60 >60  ANIONGAP  8   < > 7 8 6    < > = values in this interval not displayed.    Lipids No results for input(s): CHOL, TRIG, HDL, LABVLDL, LDLCALC, CHOLHDL in the last 168 hours.  Hematology Recent Labs  Lab 06/17/21 1802 06/18/21 0328 06/21/21 0713  WBC 8.5 7.0 6.1  RBC 4.50 4.63 4.82  HGB 15.2* 16.0* 16.7*  HCT 46.4* 47.1* 49.8*  MCV 103.1* 101.7* 103.3*  MCH 33.8 34.6* 34.6*  MCHC 32.8 34.0 33.5  RDW 13.6 13.6 13.4  PLT 145* 144* 134*   Thyroid No results for input(s): TSH, FREET4 in the last 168 hours.  BNP Recent Labs  Lab 06/17/21 1802  BNP 363.5*    DDimer No results for input(s): DDIMER in the last 168 hours.   Radiology    No results found.  Cardiac Studies   TTE 06/18/2021 1. Left ventricular ejection fraction, by estimation, is 35 to 40%. The  left ventricle has moderately decreased function. The left ventricle  demonstrates global hypokinesis. There is mild left ventricular  hypertrophy. Left ventricular diastolic function   could not be evaluated.   2. Right ventricular systolic function is moderately reduced. The right  ventricular size is normal. There is moderately elevated pulmonary artery  systolic pressure.   3. Left atrial size was moderately dilated.   4. Right atrial size was moderately dilated.   5. The mitral valve is  degenerative. Mild mitral valve regurgitation. No  evidence of mitral stenosis. Severe mitral annular calcification.   6. Tricuspid valve regurgitation is moderate.   7. The aortic valve is tricuspid. There is mild thickening of the aortic  valve. Aortic valve regurgitation is not visualized. Aortic valve  sclerosis is present, with no evidence of aortic valve stenosis.   8. The inferior vena cava is normal in size with greater than 50%  respiratory variability, suggesting right atrial pressure of 3 mmHg.   Patient Profile     81 y.o. female with history of persistent atrial fibrillation, HFrEF, COPD, presenting with shortness of breath and confusion, being seen for A. fib RVR.    Assessment & Plan   Atrial fibrillation Heart rate controlled Cont digoxin to 0.125 mg daily. Toprol-XL 25 mg twice daily  Eliquis 5 mg twice daily.   PTA Lasix 80 mg daily   2. cardiomyopathy EF 35 to 40%.  Toprol-XL 25 mg twice daily Will challenge patient with a low-dose ARB/losartan 12.5 mg daily.  Monitor BP Consider outpatient ischemic work-up if LV dysfunction persists despite adequate A. fib control.   3.  Respiratory failure -Appears improved and stable, -Management as per primary team -Dispo planning to SNF pending bed availability.     Greater than 50% was spent in counseling and coordination of care with patient Total encounter time 35 minutes     Signed, Kate Sable, MD  06/23/2021, 2:00 PM

## 2021-06-23 NOTE — Assessment & Plan Note (Signed)
P/w tachypnea into the 30s, Hypoxia to the 80s and requiring up to 6L O2.  Now resolved to baseline.  Likely main factor was rapid Afib, in the setting of untreated OSA, pHTN and COPD.

## 2021-06-23 NOTE — Progress Notes (Signed)
[facility] PROGRESS NOTE    [name]  LHT:[id number] DOB: 07/12/1940 DOA: 06/17/2021 PCP: Burnard Bunting, MD      Brief Narrative:  Mrs. [name] is a 81 y.o. female with a history of  persistent atrial fibrillation, chronic HFrEF, chronic respiratory failure with hypoxia, COPD, pulmonary hypertension, DVT, NSCLC status post chemoradiation, AAA status post endovascular repair, hypertension, hyperlipidemia, and obstructive sleep apnea who presented secondary to altered mental status and dyspnea from ALF and found to have acute on chronic respiratory failure due to rapid Afib.        Assessment & Plan:  * Acute on chronic respiratory failure with hypoxia (HCC)- (present on admission) P/w tachypnea into the 30s, Hypoxia to the 80s and requiring up to 6L O2.  Now resolved to baseline.  Likely main factor was rapid Afib, in the setting of untreated OSA, pHTN and COPD.  AMS (altered mental status)- (present on admission) At baseline, mentation is normal without memory loss.  Presented with confusion. Due to respiratory failure.  Now resolved.  Atrial fibrillation with RVR ([place])- (present on admission) - Consult cardiology, appreciate cares - Continue apixaban - Continue digoxin, metoprolol  Chronic combined systolic and diastolic CHF (congestive heart failure) ([place])- (present on admission) Appears euvolemic - Continue furosemide, losartan, metoprolol  COPD (chronic obstructive pulmonary disease) ([place])- (present on admission) No wheezing.  Severe disease at baseline, 2018 PFT showed FEV1 43% predicted. - Continue LAMA  HTN (hypertension)- (present on admission) Blood pressure controlled - Continue furosemide, losartan, metoprolol  Hypothyroid- (present on admission) - Continue levothyroxine         Disposition: Status is: Inpatient  Remains inpatient appropriate because: She will require nursing care at the time of discharge, and we are still  awaiting SNF discharge.  She is medically ready for discharge.       Level of care: Telemetry Cardiac       MDM: The below labs and imaging reports were reviewed and summarized above.  Medication management as above.    DVT prophylaxis:  apixaban (ELIQUIS) tablet 5 mg  Code Status: DNR Family Communication:     Consultants:  Cardiology           Subjective: No new fever, cough, dyspnea, confusion, chest pain, palpitations.  Objective: Vitals:   06/23/21 0110 06/23/21 0500 06/23/21 0547 06/23/21 0813  BP: 109/83  108/70 111/79  Pulse: (!) 108  99 (!) 109  Resp: 19  20 18   Temp: 97.7 F (36.5 C)  97.7 F (36.5 C) 98.1 F (36.7 C)  TempSrc:   Oral Oral  SpO2: (!) 89%  92% 94%  Weight:  89.1 kg    Height:        Intake/Output Summary (Last 24 hours) at 06/23/2021 1627 Last data filed at 06/23/2021 0546 Gross per 24 hour  Intake --  Output 525 ml  Net -525 ml   Filed Weights   06/21/21 0509 06/22/21 0534 06/23/21 0500  Weight: 87.2 kg 86.5 kg 89.1 kg    Examination: General appearance:  adult female, alert and in no acute distress.  In bed HEENT:   Skin:  Cardiac: RRR, nl S1-S2, no murmurs appreciated.  Capillary refill is brisk.No LE edema.  Radial pulses 2+ and symmetric. Respiratory: Normal respiratory rate and rhythm.  CTAB without rales or wheezes. Abdomen:     MSK:   Neuro: Awake and alert  EOMI, moves all extremities. Speech fluent.    Psych: Sensorium intact and responding to  questions, attention normal. Affect blunted.  Judgment and insight appear normal .    Data Reviewed: I have personally reviewed following labs and imaging studies:  CBC: Recent Labs  Lab 06/17/21 1802 06/18/21 0328 06/21/21 0713  WBC 8.5 7.0 6.1  HGB 15.2* 16.0* 16.7*  HCT 46.4* 47.1* 49.8*  MCV 103.1* 101.7* 103.3*  PLT 145* 144* 888*   Basic Metabolic Panel: Recent Labs  Lab 06/18/21 0328 06/19/21 0910 06/20/21 0643 06/21/21 0713 06/22/21 1032   NA 136 137 135 137 135  K 4.1 4.0 3.5 4.4 3.8  CL 102 98 99 98 97*  CO2 27 31 29 31  32  GLUCOSE 113* 113* 91 94 101*  BUN 20 17 16 18 21   CREATININE 1.02* 0.78 0.68 0.80 0.87  CALCIUM 9.0 9.1 8.8* 9.1 9.1   GFR: Estimated Creatinine Clearance: 56 mL/min (by C-G formula based on SCr of 0.87 mg/dL). Liver Function Tests: Recent Labs  Lab 06/17/21 1802  AST 36  ALT 16  ALKPHOS 48  BILITOT 1.5*  PROT 7.1  ALBUMIN 3.6   No results for input(s): LIPASE, AMYLASE in the last 168 hours. No results for input(s): AMMONIA in the last 168 hours. Coagulation Profile: No results for input(s): INR, PROTIME in the last 168 hours. Cardiac Enzymes: Recent Labs  Lab 06/17/21 1802  CKTOTAL 909*   BNP (last 3 results) No results for input(s): PROBNP in the last 8760 hours. HbA1C: No results for input(s): HGBA1C in the last 72 hours. CBG: No results for input(s): GLUCAP in the last 168 hours. Lipid Profile: No results for input(s): CHOL, HDL, LDLCALC, TRIG, CHOLHDL, LDLDIRECT in the last 72 hours. Thyroid Function Tests: No results for input(s): TSH, T4TOTAL, FREET4, T3FREE, THYROIDAB in the last 72 hours. Anemia Panel: No results for input(s): VITAMINB12, FOLATE, FERRITIN, TIBC, IRON, RETICCTPCT in the last 72 hours. Urine analysis:    Component Value Date/Time   COLORURINE YELLOW 06/18/2021 0123   APPEARANCEUR CLEAR 06/18/2021 0123   LABSPEC 1.020 06/18/2021 0123   PHURINE 5.5 06/18/2021 0123   GLUCOSEU NEGATIVE 06/18/2021 0123   HGBUR MODERATE (A) 06/18/2021 0123   BILIRUBINUR NEGATIVE 06/18/2021 0123   KETONESUR TRACE (A) 06/18/2021 0123   PROTEINUR NEGATIVE 06/18/2021 0123   UROBILINOGEN 1.0 05/29/2009 2253   NITRITE NEGATIVE 06/18/2021 0123   LEUKOCYTESUR NEGATIVE 06/18/2021 0123   Sepsis Labs: @LABRCNTIP (procalcitonin:4,lacticacidven:4)  ) Recent Results (from the past 240 hour(s))  Resp Panel by RT-PCR (Flu A&B, Covid) Nasopharyngeal Swab     Status: None    Collection Time: 06/17/21  6:02 PM   Specimen: Nasopharyngeal Swab; Nasopharyngeal(NP) swabs in vial transport medium  Result Value Ref Range Status   SARS Coronavirus 2 by RT PCR NEGATIVE NEGATIVE Final    Comment: (NOTE) SARS-CoV-2 target nucleic acids are NOT DETECTED.  The SARS-CoV-2 RNA is generally detectable in upper respiratory specimens during the acute phase of infection. The lowest concentration of SARS-CoV-2 viral copies this assay can detect is 138 copies/mL. A negative result does not preclude SARS-Cov-2 infection and should not be used as the sole basis for treatment or other patient management decisions. A negative result may occur with  improper specimen collection/handling, submission of specimen other than nasopharyngeal swab, presence of viral mutation(s) within the areas targeted by this assay, and inadequate number of viral copies(<138 copies/mL). A negative result must be combined with clinical observations, patient history, and epidemiological information. The expected result is Negative.  Fact Sheet for Patients:  EntrepreneurPulse.com.au  Fact Sheet for  Healthcare Providers:  IncredibleEmployment.be  This test is no t yet approved or cleared by the [place] and  has been authorized for detection and/or diagnosis of SARS-CoV-2 by FDA under an Emergency Use Authorization (EUA). This EUA will remain  in effect (meaning this test can be used) for the duration of the COVID-19 declaration under Section 564(b)(1) of the Act, 21 U.S.C.section 360bbb-3(b)(1), unless the authorization is terminated  or revoked sooner.       Influenza A by PCR NEGATIVE NEGATIVE Final   Influenza B by PCR NEGATIVE NEGATIVE Final    Comment: (NOTE) The Xpert Xpress SARS-CoV-2/FLU/RSV plus assay is intended as an aid in the diagnosis of influenza from Nasopharyngeal swab specimens and should not be used as a sole basis for treatment.  Nasal washings and aspirates are unacceptable for Xpert Xpress SARS-CoV-2/FLU/RSV testing.  Fact Sheet for Patients: EntrepreneurPulse.com.au  Fact Sheet for Healthcare Providers: IncredibleEmployment.be  This test is not yet approved or cleared by the [place] FDA and has been authorized for detection and/or diagnosis of SARS-CoV-2 by FDA under an Emergency Use Authorization (EUA). This EUA will remain in effect (meaning this test can be used) for the duration of the COVID-19 declaration under Section 564(b)(1) of the Act, 21 U.S.C. section 360bbb-3(b)(1), unless the authorization is terminated or revoked.  Performed at [facility], [address] [place], [place] [address]   MRSA Next Gen by PCR, Nasal     Status: None   Collection Time: 06/19/21  4:47 AM   Specimen: Nasal Mucosa; Nasal Swab  Result Value Ref Range Status   MRSA by PCR Next Gen NOT DETECTED NOT DETECTED Final    Comment: (NOTE) The GeneXpert MRSA Assay (FDA approved for NASAL specimens only), is one component of a comprehensive MRSA colonization surveillance program. It is not intended to diagnose MRSA infection nor to guide or monitor treatment for MRSA infections. Test performance is not FDA approved in patients less than 86 years old. Performed at [facility], [address], [place], [place] [address]          Radiology Studies: No results found.      Scheduled Meds:  apixaban  5 mg Oral BID   citalopram  20 mg Oral Daily   digoxin  0.125 mg Oral Daily   docusate sodium  100 mg Oral BID   furosemide  80 mg Oral Daily   gabapentin  100 mg Oral TID   levothyroxine  88 mcg Oral Daily   losartan  12.5 mg Oral Daily   metoprolol succinate  25 mg Oral BID   nicotine  14 mg Transdermal Daily   sodium chloride flush  3 mL Intravenous Q12H   tiotropium  18 mcg Inhalation Daily   Continuous Infusions:   LOS: 6 days        Edwin Dada, MD [facility] 06/23/2021, 4:27 PM     Please page though [place] or Epic secure chat:  For [employer], Adult nurse

## 2021-06-23 NOTE — Assessment & Plan Note (Signed)
-   Consult cardiology, appreciate cares - Continue apixaban - Continue digoxin, metoprolol

## 2021-06-23 NOTE — Assessment & Plan Note (Signed)
Continue levothyroxine 

## 2021-06-24 DIAGNOSIS — J9621 Acute and chronic respiratory failure with hypoxia: Secondary | ICD-10-CM | POA: Diagnosis not present

## 2021-06-24 DIAGNOSIS — I4821 Permanent atrial fibrillation: Secondary | ICD-10-CM | POA: Diagnosis not present

## 2021-06-24 DIAGNOSIS — I5023 Acute on chronic systolic (congestive) heart failure: Secondary | ICD-10-CM | POA: Diagnosis not present

## 2021-06-24 LAB — BASIC METABOLIC PANEL
Anion gap: 8 (ref 5–15)
BUN: 22 mg/dL (ref 8–23)
CO2: 30 mmol/L (ref 22–32)
Calcium: 9 mg/dL (ref 8.9–10.3)
Chloride: 95 mmol/L — ABNORMAL LOW (ref 98–111)
Creatinine, Ser: 0.73 mg/dL (ref 0.44–1.00)
GFR, Estimated: >60 mL/min (ref >60)
Glucose, Bld: 91 mg/dL (ref 70–99)
Potassium: 3.5 mmol/L (ref 3.5–5.1)
Sodium: 133 mmol/L — ABNORMAL LOW (ref 135–145)

## 2021-06-24 NOTE — Progress Notes (Signed)
Occupational Therapy Treatment Patient Details Name: [name] MRN: [id number] DOB: 07-Jul-1940 Today's Date: 06/24/2021   History of present illness Pt is a 81 y.o. female seen in ed with complaints of AMS and fall today , pt slid out of bed, no head injury. Per EMS pt in Afib with RVR. PMH of cOPD, DVT on Eliquis, A. fib on Eliquis, hypertension, hyperlipidemia, congestive heart failure, chronic O2 use at 3L baseline, non-small cell lung cancer status post chemoradiation, AAA status post aortic stent graft followed by vascular surgery, descending thoracic aortic aneurysm.   OT comments  Pt seen for OT treatment on this date. Upon arrival to room, pt awake and seated upright in bed. Pt A&Ox4, however required increased processing time and cues for sequencing familiar tasks this date. Pt performed bed mobility and sit>stand transfer, requiring MIN GUARD d/t decreased activity tolerance and strength. Once standing, pt reported urge to urinate; pt required MIN GUARD and verbal cues from this author to walk to [facility] (~66ft) and perform BSC transfer. Pt performed sit>stand peri-care with MIN GUARD and seated hand hygiene with SET-UP assist. Pt walked to ~64ft BSC>recliner, however required MIN A d/t increasing fatigue during OOB mobility. Pt is making good progress toward goals and continues to benefit from skilled OT services to maximize return to PLOF and minimize risk of future falls, injury, caregiver burden, and readmission. Will continue to follow POC. Discharge recommendation remains appropriate.   Recommendations for follow up therapy are one component of a multi-disciplinary discharge planning process, led by the attending physician.  Recommendations may be updated based on patient status, additional functional criteria and insurance authorization.    Follow Up Recommendations  Skilled nursing-short term rehab (<3 hours/day)    Assistance Recommended at Discharge Frequent or constant  Supervision/Assistance  Equipment Recommendations  Other (comment) (defer to next venue of care)       Precautions / Restrictions Precautions Precautions: Fall Precaution Comments: watch HR, O2 ( A fib) Restrictions Weight Bearing Restrictions: No       Mobility Bed Mobility Overal bed mobility: Needs Assistance Bed Mobility: Supine to Sit     Supine to sit: Min guard;HOB elevated     General bed mobility comments: Requires use of bedrails, and increased time/effort    Transfers Overall transfer level: Needs assistance Equipment used: Rolling walker (2 wheels) Transfers: Sit to/from Stand Sit to Stand: Min guard           General transfer comment: Requires MIN GUARD for safety & vcs for improved technique and sequencing     Balance Overall balance assessment: Needs assistance Sitting-balance support: No upper extremity supported;Feet supported Sitting balance-Leahy Scale: Fair Sitting balance - Comments: Supervision reaching within BOS   Standing balance support: Bilateral upper extremity supported;During functional activity;Reliant on assistive device for balance Standing balance-Leahy Scale: Poor Standing balance comment: requires MIN GUARD-MIN A for functional mobility of short household distances                           ADL either performed or assessed with clinical judgement   ADL Overall ADL's : Needs assistance/impaired     Grooming: Wash/dry hands;Set up;Sitting                   Toilet Transfer: Min guard;Ambulation;BSC/3in1   Toileting- Water quality scientist and Hygiene: Min guard;Sit to/from stand       Functional mobility during ADLs: Minimal assistance;Rolling walker (2 wheels) (to walk  69ft+3ft)        Cognition Arousal/Alertness: Awake/alert Behavior During Therapy: WFL for tasks assessed/performed Overall Cognitive Status: No family/caregiver present to determine baseline cognitive functioning                                  General Comments: A&Ox4. Requires increased processing time and cues for sequencing familiar tasks.                     Pertinent Vitals/ Pain       Pain Assessment: Faces Faces Pain Scale: Hurts a little bit Pain Location: back Pain Descriptors / Indicators: Discomfort Pain Intervention(s): Limited activity within patient's tolerance;Monitored during session;Repositioned         Frequency  Min 2X/week        Progress Toward Goals  OT Goals(current goals can now be found in the care plan section)  Progress towards OT goals: Progressing toward goals  Acute Rehab OT Goals Patient Stated Goal: to return home OT Goal Formulation: With patient Time For Goal Achievement: 07/03/21 Potential to Achieve Goals: Good  Plan Discharge plan remains appropriate;Frequency remains appropriate       AM-PAC OT "6 Clicks" Daily Activity     Outcome Measure   Help from another person eating meals?: None Help from another person taking care of personal grooming?: A Little Help from another person toileting, which includes using toliet, bedpan, or urinal?: A Little Help from another person bathing (including washing, rinsing, drying)?: A Lot Help from another person to put on and taking off regular upper body clothing?: A Little Help from another person to put on and taking off regular lower body clothing?: A Lot 6 Click Score: 17    End of Session Equipment Utilized During Treatment: Rolling walker (2 wheels);Oxygen  OT Visit Diagnosis: Muscle weakness (generalized) (M62.81);Unsteadiness on feet (R26.81);History of falling (Z91.81)   Activity Tolerance Patient tolerated treatment well   Patient Left in chair;with call bell/phone within reach;with chair alarm set   Nurse Communication Mobility status        Time: 9169-4503 OT Time Calculation (min): 38 min  Charges: OT General Charges $OT Visit: 1 Visit OT Treatments $Self Care/Home  Management : 38-52 mins  Fredirick Maudlin, OTR/L [place]

## 2021-06-24 NOTE — Progress Notes (Signed)
Physical Therapy Treatment Patient Details Name: [name] MRN: [id number] DOB: September 24, 1940 Today's Date: 06/24/2021   History of Present Illness Pt is a 81 y.o. female seen in ed with complaints of AMS and fall today , pt slid out of bed, no head injury. Per EMS pt in Afib with RVR. PMH of cOPD, DVT on Eliquis, A. fib on Eliquis, hypertension, hyperlipidemia, congestive heart failure, chronic O2 use at 3L baseline, non-small cell lung cancer status post chemoradiation, AAA status post aortic stent graft followed by vascular surgery, descending thoracic aortic aneurysm.    PT Comments    forward/backward stepping with RW, constant manual assist to advance RW and facilitate weight shift/stepping (mild difficulty coordinating forward stepping on command).  Mild buckling to R knee; notably fatigued with very minimal effort during gait trial.  Desat to 86% on 3L with exertion; requires seated rest break and cuing for pursed lip breathing for recovery Patient very motivated to participate/progress, goal of being able to see/meet great grandson (one year old).  Continues to mild word-finding difficulties, mild motor planning/coordination deficits and overall AMS that necessitates 24-hour sup/care.    Recommendations for follow up therapy are one component of a multi-disciplinary discharge planning process, led by the attending physician.  Recommendations may be updated based on patient status, additional functional criteria and insurance authorization.  Follow Up Recommendations  Skilled nursing-short term rehab (<3 hours/day)     Assistance Recommended at Discharge    Equipment Recommendations  BSC/3in1;Rollator (4 wheels)    Recommendations for Other Services       Precautions / Restrictions Precautions Precautions: Fall Precaution Comments: watch HR, O2 ( A fib) Restrictions Weight Bearing Restrictions: No     Mobility  Bed Mobility Overal bed mobility: Needs Assistance Bed  Mobility: Supine to Sit     Supine to sit: Min guard;HOB elevated     General bed mobility comments: seated in recliner beginning/end of treatment session    Transfers Overall transfer level: Needs assistance Equipment used: Rolling walker (2 wheels) Transfers: Sit to/from Stand Sit to Stand: Min assist           General transfer comment: consistent cuing for hand placement; physical assist for anterior weight translation, tendancy towards posterior LOB with each repetition    Ambulation/Gait Ambulation/Gait assistance: Min assist;Mod assist Gait Distance (Feet):  (5' x3) Assistive device: Rolling walker (2 wheels)         General Gait Details: forward/backward stepping with RW, constant manual assist to advance RW and facilitate weight shift/stepping (mild difficulty coordinating forward stepping on command).  Mild buckling to R knee; notably fatigued with very minimal effort during gait trial.  Desat to 86% on 3L with exertion; requires seated rest break and cuing for pursed lip breathing for recovery   Stairs             Wheelchair Mobility    Modified Rankin (Stroke Patients Only)       Balance Overall balance assessment: Needs assistance Sitting-balance support: No upper extremity supported;Feet supported Sitting balance-Leahy Scale: Good Sitting balance - Comments: Supervision reaching within BOS   Standing balance support: Bilateral upper extremity supported Standing balance-Leahy Scale: Poor Standing balance comment: consistent posterior LOB with standing efforts                            Cognition Arousal/Alertness: Awake/alert Behavior During Therapy: WFL for tasks assessed/performed Overall Cognitive Status: No family/caregiver present to determine  baseline cognitive functioning                                 General Comments: A&Ox4. Requires increased processing time and cues for sequencing familiar tasks.  Intermittent word-finding difficulties; mild motor-planning deficits at times        Exercises Other Exercises Other Exercises: Seated LE therex, 1x10, active ROM for muscular strength/endurance-consistent cuing for movement throughout full, available ROM Other Exercises: Sit/stand x5 with RW, min assist-emphasis on hand placement, weight shift, standing posture and deep breathing    General Comments        Pertinent Vitals/Pain Pain Assessment: No/denies pain    Home Living                          Prior Function            PT Goals (current goals can now be found in the care plan section) Acute Rehab PT Goals Patient Stated Goal: to go home PT Goal Formulation: With patient Time For Goal Achievement: 07/03/21 Potential to Achieve Goals: Good Progress towards PT goals: Progressing toward goals    Frequency    Min 2X/week      PT Plan Current plan remains appropriate    Co-evaluation              AM-PAC PT "6 Clicks" Mobility   Outcome Measure  Help needed turning from your back to your side while in a flat bed without using bedrails?: A Little Help needed moving from lying on your back to sitting on the side of a flat bed without using bedrails?: A Little Help needed moving to and from a bed to a chair (including a wheelchair)?: A Little Help needed standing up from a chair using your arms (e.g., wheelchair or bedside chair)?: A Little Help needed to walk in hospital room?: A Lot Help needed climbing 3-5 steps with a railing? : A Lot 6 Click Score: 16    End of Session Equipment Utilized During Treatment: Gait belt;Oxygen Activity Tolerance: Patient tolerated treatment well Patient left: with call bell/phone within reach;in chair;with chair alarm set Nurse Communication: Mobility status PT Visit Diagnosis: Other abnormalities of gait and mobility (R26.89);Difficulty in walking, not elsewhere classified (R26.2);Muscle weakness (generalized)  (M62.81)     Time: 1610-9604 PT Time Calculation (min) (ACUTE ONLY): 29 min  Charges:  $Gait Training: 8-22 mins $Therapeutic Activity: 8-22 mins                     [place] H. Owens Shark, PT, DPT, NCS 06/24/21, 4:56 PM [phone]

## 2021-06-24 NOTE — TOC Progression Note (Signed)
Transition of Care [facility]) - Progression Note    Patient Details  Name: [name] MRN: [id number] Date of Birth: Jul 21, 1940  Transition of Care [facility]) CM/SW [place], [place] Phone Number: 06/24/2021, 2:54 PM  Clinical Narrative:     Patient has bed offer at [facility] which she has accepted. Patient currently not ready for d/c.   Expected Discharge Plan: Assisted Living Barriers to Discharge: Continued Medical Work up  Expected Discharge Plan and Services Expected Discharge Plan: Assisted Living       Living arrangements for the past 2 months: Assisted Living Facility                                       Social Determinants of Health (SDOH) Interventions    Readmission Risk Interventions No flowsheet data found.

## 2021-06-24 NOTE — Progress Notes (Signed)
Progress Note  Patient Name: [name] Date of Encounter: 06/24/2021  [facility] Cardiologist: Peter Martinique, MD   Subjective   Breathing back to baseline.  No chest pain, palpitations, or lightheadedness.  No significant edema reported by patient.  She is awaiting transfer to rehab.  Inpatient Medications    Scheduled Meds:  apixaban  5 mg Oral BID   citalopram  20 mg Oral Daily   digoxin  0.125 mg Oral Daily   docusate sodium  100 mg Oral BID   furosemide  80 mg Oral Daily   gabapentin  100 mg Oral TID   levothyroxine  88 mcg Oral Daily   losartan  12.5 mg Oral Daily   metoprolol succinate  25 mg Oral BID   sodium chloride flush  3 mL Intravenous Q12H   tiotropium  18 mcg Inhalation Daily   Continuous Infusions:  PRN Meds: acetaminophen **OR** acetaminophen, bisacodyl, hydrALAZINE, levalbuterol, morphine, ondansetron **OR** ondansetron (ZOFRAN) IV, polyethylene glycol, traZODone   Vital Signs    Vitals:   06/24/21 0514 06/24/21 0559 06/24/21 0754 06/24/21 0913  BP: 106/84  108/77 120/77  Pulse: 62  64 85  Resp: 20     Temp: 97.6 F (36.4 C)     TempSrc:      SpO2: 93%  94%   Weight:  87 kg    Height:        Intake/Output Summary (Last 24 hours) at 06/24/2021 1000 Last data filed at 06/24/2021 0522 Gross per 24 hour  Intake --  Output 600 ml  Net -600 ml   Last 3 Weights 06/24/2021 06/24/2021 06/23/2021  Weight (lbs) 191 lb 12.8 oz 193 lb 5.5 oz 196 lb 6.9 oz  Weight (kg) 87 kg 87.7 kg 89.1 kg      Telemetry    Atrial fibrillation with ventricular rates 85-115 bpm. - Personally Reviewed.  ECG    No new tracing  Physical Exam   GEN: No acute distress.   Neck: No obvious JVD, though body habitus limits evaluation. Cardiac: Irregularly irregular rhythm without murmurs. Respiratory: Coarse breath sounds, mildly diminished at the lung bases. GI: Soft, nontender, non-distended  MS: Trace chronic appearing pretibial edema; No deformity. Neuro:   Nonfocal  Psych: Normal affect   Labs    High Sensitivity Troponin:   Recent Labs  Lab 06/17/21 1802 06/18/21 0122 06/18/21 0322 06/18/21 2157  TROPONINIHS 35* 41* 42* 31*     Chemistry Recent Labs  Lab 06/17/21 1802 06/18/21 0328 06/20/21 0643 06/21/21 0713 06/22/21 1032  NA 137   < > 135 137 135  K 4.8   < > 3.5 4.4 3.8  CL 105   < > 99 98 97*  CO2 24   < > 29 31 32  GLUCOSE 113*   < > 91 94 101*  BUN 22   < > 16 18 21   CREATININE 0.97   < > 0.68 0.80 0.87  CALCIUM 9.6   < > 8.8* 9.1 9.1  PROT 7.1  --   --   --   --   ALBUMIN 3.6  --   --   --   --   AST 36  --   --   --   --   ALT 16  --   --   --   --   ALKPHOS 48  --   --   --   --   BILITOT 1.5*  --   --   --   --  GFRNONAA 58*   < > >60 >60 >60  ANIONGAP 8   < > 7 8 6    < > = values in this interval not displayed.    Lipids No results for input(s): CHOL, TRIG, HDL, LABVLDL, LDLCALC, CHOLHDL in the last 168 hours.  Hematology Recent Labs  Lab 06/17/21 1802 06/18/21 0328 06/21/21 0713  WBC 8.5 7.0 6.1  RBC 4.50 4.63 4.82  HGB 15.2* 16.0* 16.7*  HCT 46.4* 47.1* 49.8*  MCV 103.1* 101.7* 103.3*  MCH 33.8 34.6* 34.6*  MCHC 32.8 34.0 33.5  RDW 13.6 13.6 13.4  PLT 145* 144* 134*   Thyroid No results for input(s): TSH, FREET4 in the last 168 hours.  BNP Recent Labs  Lab 06/17/21 1802  BNP 363.5*    DDimer No results for input(s): DDIMER in the last 168 hours.   Radiology    No results found.  Cardiac Studies   TTE (06/18/2021): 1. Left ventricular ejection fraction, by estimation, is 35 to 40%. The  left ventricle has moderately decreased function. The left ventricle  demonstrates global hypokinesis. There is mild left ventricular  hypertrophy. Left ventricular diastolic function   could not be evaluated.   2. Right ventricular systolic function is moderately reduced. The right  ventricular size is normal. There is moderately elevated pulmonary artery  systolic pressure.   3. Left atrial  size was moderately dilated.   4. Right atrial size was moderately dilated.   5. The mitral valve is degenerative. Mild mitral valve regurgitation. No  evidence of mitral stenosis. Severe mitral annular calcification.   6. Tricuspid valve regurgitation is moderate.   7. The aortic valve is tricuspid. There is mild thickening of the aortic  valve. Aortic valve regurgitation is not visualized. Aortic valve  sclerosis is present, with no evidence of aortic valve stenosis.   8. The inferior vena cava is normal in size with greater than 50%  respiratory variability, suggesting right atrial pressure of 3 mmHg.   Patient Profile     81 y.o. female with history of chronic HFrEF, permanent atrial fibrillation, and COPD, admitted with confusion and acute on chronic respiratory failure with hypoxia, found to be in atrial fibrillation with rapid ventricular response.  Assessment & Plan    Permanent atrial fibrillation: Ventricular rates stable, typically ranging 90 to 110 bpm at rest. -Continue current doses of metoprolol and digoxin. -Continue apixaban.  Acute on chronic HFrEF: Volume status is stable.  Weight down 2 pounds from yesterday.  Continue home patient started on low-dose losartan yesterday as part of GDMT.  Repeat BMP pending. -Follow-up today's BMP. -Continue current doses of losartan and metoprolol succinate.  Acute on chronic respiratory failure with hypoxia: Breathing seems to be back to baseline. -Ongoing management per primary team.  For questions or updates, please contact [place] Please consult [email or web address] for contact info under [facility].     Signed, Nelva Bush, MD  06/24/2021, 10:00 AM

## 2021-06-24 NOTE — Progress Notes (Signed)
PROGRESS NOTE    [name]  VPX:[id number] DOB: 1941-02-17 DOA: 06/17/2021 PCP: Burnard Bunting, MD    Chief Complaint  Patient presents with   Altered Mental Status    Brief Narrative:   [name] is a 81 y.o. female with a history of  persistent atrial fibrillation, chronic HFrEF, chronic respiratory failure with hypoxia, COPD, pulmonary hypertension, DVT, NSCLC status post chemoradiation, AAA status post endovascular repair, hypertension, hyperlipidemia, and obstructive sleep apnea who presented secondary to altered mental status and dyspnea from ALF and found to have acute on chronic respiratory failure due to rapid Afib.    Assessment & Plan:   Principal Problem:   Acute on chronic respiratory failure with hypoxia (HCC) Active Problems:   COPD (chronic obstructive pulmonary disease) (HCC)   Hypothyroid   HTN (hypertension)   Atrial fibrillation with RVR (HCC)   Chronic combined systolic and diastolic CHF (congestive heart failure) (HCC)   AMS (altered mental status)   Acute on chronic respiratory failure with hypoxia:  Much improved.  In the setting of OSA, COPD.    Acute metabolic encephalopathy in the setting of cognitive deficits.  Appears to have improved.    Atrial fibrillation with RVR.  Cardiology consulted and recommendations given.  Resume eliquis for anti coagulation.  Continue with metoprolol and digoxin.    Chronic combined systolic and diastolic CHF;  Resume lasix metoprolol, d.c losartan due to low BP parameters.    Hypotension:  Hold losartan.    Hypothyroidism:  Resume Synthroid.    COPD:  No wheezing heard on exam.  Resume bronchodilators.    DVT prophylaxis: Eliquis.  Code Status: full code.  Family Communication: none at bedside.  Disposition:   Status is: Inpatient  Remains inpatient appropriate because: snf placement. Hypotension.        Consultants:  Cardiology.   Procedures: none.  Antimicrobials:  None.   Subjective: No new complaints , asymptomatic.   Objective: Vitals:   06/24/21 0913 06/24/21 1203 06/24/21 1400 06/24/21 1518  BP: 120/77 (!) 71/60 (!) 99/54 (!) 86/69  Pulse: 85 76 68 (!) 108  Resp:   16   Temp:   (!) 97.4 F (36.3 C)   TempSrc:   Oral   SpO2:  95% 97% 100%  Weight:      Height:        Intake/Output Summary (Last 24 hours) at 06/24/2021 1530 Last data filed at 06/24/2021 0522 Gross per 24 hour  Intake --  Output 600 ml  Net -600 ml   Filed Weights   06/23/21 0500 06/24/21 0134 06/24/21 0559  Weight: 89.1 kg 87.7 kg 87 kg    Examination:  General exam: Appears calm and comfortable  Respiratory system: Clear to auscultation. Respiratory effort normal. Cardiovascular system: S1 & S2 heard, RRR. No JVD,  No pedal edema. Gastrointestinal system: Abdomen is nondistended, soft and nontender.  Normal bowel sounds heard. Central nervous system: Alert and oriented. No focal neurological deficits. Extremities: Symmetric 5 x 5 power. Skin: No rashes, lesions or ulcers Psychiatry:  Mood & affect appropriate.     Data Reviewed: I have personally reviewed following labs and imaging studies  CBC: Recent Labs  Lab 06/17/21 1802 06/18/21 0328 06/21/21 0713  WBC 8.5 7.0 6.1  HGB 15.2* 16.0* 16.7*  HCT 46.4* 47.1* 49.8*  MCV 103.1* 101.7* 103.3*  PLT 145* 144* 134*    Basic Metabolic Panel: Recent Labs  Lab 06/19/21 0910 06/20/21 0643 06/21/21 0713 06/22/21 1032 06/24/21 1003  NA 137 135 137 135 133*  K 4.0 3.5 4.4 3.8 3.5  CL 98 99 98 97* 95*  CO2 31 29 31  32 30  GLUCOSE 113* 91 94 101* 91  BUN 17 16 18 21 22   CREATININE 0.78 0.68 0.80 0.87 0.73  CALCIUM 9.1 8.8* 9.1 9.1 9.0    GFR: Estimated Creatinine Clearance: 60.3 mL/min (by C-G formula based on SCr of 0.73 mg/dL).  Liver Function Tests: Recent Labs  Lab 06/17/21 1802  AST 36  ALT 16  ALKPHOS 48  BILITOT 1.5*  PROT 7.1  ALBUMIN 3.6    CBG: No results for input(s):  GLUCAP in the last 168 hours.   Recent Results (from the past 240 hour(s))  Resp Panel by RT-PCR (Flu A&B, Covid) Nasopharyngeal Swab     Status: None   Collection Time: 06/17/21  6:02 PM   Specimen: Nasopharyngeal Swab; Nasopharyngeal(NP) swabs in vial transport medium  Result Value Ref Range Status   SARS Coronavirus 2 by RT PCR NEGATIVE NEGATIVE Final    Comment: (NOTE) SARS-CoV-2 target nucleic acids are NOT DETECTED.  The SARS-CoV-2 RNA is generally detectable in upper respiratory specimens during the acute phase of infection. The lowest concentration of SARS-CoV-2 viral copies this assay can detect is 138 copies/mL. A negative result does not preclude SARS-Cov-2 infection and should not be used as the sole basis for treatment or other patient management decisions. A negative result may occur with  improper specimen collection/handling, submission of specimen other than nasopharyngeal swab, presence of viral mutation(s) within the areas targeted by this assay, and inadequate number of viral copies(<138 copies/mL). A negative result must be combined with clinical observations, patient history, and epidemiological information. The expected result is Negative.  Fact Sheet for Patients:  EntrepreneurPulse.com.au  Fact Sheet for Healthcare Providers:  IncredibleEmployment.be  This test is no t yet approved or cleared by the [place] FDA and  has been authorized for detection and/or diagnosis of SARS-CoV-2 by FDA under an Emergency Use Authorization (EUA). This EUA will remain  in effect (meaning this test can be used) for the duration of the COVID-19 declaration under Section 564(b)(1) of the Act, 21 U.S.C.section 360bbb-3(b)(1), unless the authorization is terminated  or revoked sooner.       Influenza A by PCR NEGATIVE NEGATIVE Final   Influenza B by PCR NEGATIVE NEGATIVE Final    Comment: (NOTE) The Xpert Xpress SARS-CoV-2/FLU/RSV  plus assay is intended as an aid in the diagnosis of influenza from Nasopharyngeal swab specimens and should not be used as a sole basis for treatment. Nasal washings and aspirates are unacceptable for Xpert Xpress SARS-CoV-2/FLU/RSV testing.  Fact Sheet for Patients: EntrepreneurPulse.com.au  Fact Sheet for Healthcare Providers: IncredibleEmployment.be  This test is not yet approved or cleared by the [place] FDA and has been authorized for detection and/or diagnosis of SARS-CoV-2 by FDA under an Emergency Use Authorization (EUA). This EUA will remain in effect (meaning this test can be used) for the duration of the COVID-19 declaration under Section 564(b)(1) of the Act, 21 U.S.C. section 360bbb-3(b)(1), unless the authorization is terminated or revoked.  Performed at [facility], [address] [place], [place] [address]   MRSA Next Gen by PCR, Nasal     Status: None   Collection Time: 06/19/21  4:47 AM   Specimen: Nasal Mucosa; Nasal Swab  Result Value Ref Range Status   MRSA by PCR Next Gen NOT DETECTED NOT DETECTED Final    Comment: (  NOTE) The GeneXpert MRSA Assay (FDA approved for NASAL specimens only), is one component of a comprehensive MRSA colonization surveillance program. It is not intended to diagnose MRSA infection nor to guide or monitor treatment for MRSA infections. Test performance is not FDA approved in patients less than 29 years old. Performed at [facility], [address], [place], [place] [address]          Radiology Studies: No results found.      Scheduled Meds:  apixaban  5 mg Oral BID   citalopram  20 mg Oral Daily   digoxin  0.125 mg Oral Daily   docusate sodium  100 mg Oral BID   furosemide  80 mg Oral Daily   gabapentin  100 mg Oral TID   levothyroxine  88 mcg Oral Daily   metoprolol succinate  25 mg Oral BID   sodium chloride flush  3 mL Intravenous Q12H    tiotropium  18 mcg Inhalation Daily   Continuous Infusions:   LOS: 7 days        Hosie Poisson, MD [facility]   To contact the attending provider between 7A-7P or the covering provider during after hours 7P-7A, please log into the web site [email or web address] and access using universal [place] password for that web site. If you do not have the password, please call the hospital operator.  06/24/2021, 3:30 PM

## 2021-06-25 DIAGNOSIS — I5043 Acute on chronic combined systolic (congestive) and diastolic (congestive) heart failure: Secondary | ICD-10-CM | POA: Diagnosis not present

## 2021-06-25 DIAGNOSIS — J99 Respiratory disorders in diseases classified elsewhere: Secondary | ICD-10-CM | POA: Diagnosis not present

## 2021-06-25 DIAGNOSIS — J9620 Acute and chronic respiratory failure, unspecified whether with hypoxia or hypercapnia: Secondary | ICD-10-CM | POA: Diagnosis not present

## 2021-06-25 DIAGNOSIS — G4709 Other insomnia: Secondary | ICD-10-CM | POA: Diagnosis not present

## 2021-06-25 DIAGNOSIS — Z743 Need for continuous supervision: Secondary | ICD-10-CM | POA: Diagnosis not present

## 2021-06-25 DIAGNOSIS — I5042 Chronic combined systolic (congestive) and diastolic (congestive) heart failure: Secondary | ICD-10-CM | POA: Diagnosis not present

## 2021-06-25 DIAGNOSIS — J9621 Acute and chronic respiratory failure with hypoxia: Secondary | ICD-10-CM | POA: Diagnosis not present

## 2021-06-25 DIAGNOSIS — E871 Hypo-osmolality and hyponatremia: Secondary | ICD-10-CM | POA: Diagnosis not present

## 2021-06-25 DIAGNOSIS — I509 Heart failure, unspecified: Secondary | ICD-10-CM | POA: Diagnosis not present

## 2021-06-25 DIAGNOSIS — F32A Depression, unspecified: Secondary | ICD-10-CM | POA: Diagnosis not present

## 2021-06-25 DIAGNOSIS — I10 Essential (primary) hypertension: Secondary | ICD-10-CM | POA: Diagnosis not present

## 2021-06-25 DIAGNOSIS — R2681 Unsteadiness on feet: Secondary | ICD-10-CM | POA: Diagnosis not present

## 2021-06-25 DIAGNOSIS — Z85118 Personal history of other malignant neoplasm of bronchus and lung: Secondary | ICD-10-CM | POA: Diagnosis not present

## 2021-06-25 DIAGNOSIS — E876 Hypokalemia: Secondary | ICD-10-CM | POA: Diagnosis not present

## 2021-06-25 DIAGNOSIS — I4891 Unspecified atrial fibrillation: Secondary | ICD-10-CM | POA: Diagnosis not present

## 2021-06-25 DIAGNOSIS — G9341 Metabolic encephalopathy: Secondary | ICD-10-CM | POA: Diagnosis not present

## 2021-06-25 DIAGNOSIS — G4733 Obstructive sleep apnea (adult) (pediatric): Secondary | ICD-10-CM | POA: Diagnosis not present

## 2021-06-25 DIAGNOSIS — R5383 Other fatigue: Secondary | ICD-10-CM | POA: Diagnosis not present

## 2021-06-25 DIAGNOSIS — E039 Hypothyroidism, unspecified: Secondary | ICD-10-CM | POA: Diagnosis not present

## 2021-06-25 DIAGNOSIS — R52 Pain, unspecified: Secondary | ICD-10-CM | POA: Diagnosis not present

## 2021-06-25 DIAGNOSIS — Z736 Limitation of activities due to disability: Secondary | ICD-10-CM | POA: Diagnosis not present

## 2021-06-25 DIAGNOSIS — M6259 Muscle wasting and atrophy, not elsewhere classified, multiple sites: Secondary | ICD-10-CM | POA: Diagnosis not present

## 2021-06-25 DIAGNOSIS — R0902 Hypoxemia: Secondary | ICD-10-CM | POA: Diagnosis not present

## 2021-06-25 DIAGNOSIS — I4819 Other persistent atrial fibrillation: Secondary | ICD-10-CM | POA: Diagnosis not present

## 2021-06-25 DIAGNOSIS — M6281 Muscle weakness (generalized): Secondary | ICD-10-CM | POA: Diagnosis not present

## 2021-06-25 DIAGNOSIS — J449 Chronic obstructive pulmonary disease, unspecified: Secondary | ICD-10-CM | POA: Diagnosis not present

## 2021-06-25 LAB — PHOSPHORUS: Phosphorus: 3.3 mg/dL (ref 2.5–4.6)

## 2021-06-25 LAB — CBC
HCT: 47.1 % — ABNORMAL HIGH (ref 36.0–46.0)
Hemoglobin: 15.9 g/dL — ABNORMAL HIGH (ref 12.0–15.0)
MCH: 34.7 pg — ABNORMAL HIGH (ref 26.0–34.0)
MCHC: 33.8 g/dL (ref 30.0–36.0)
MCV: 102.8 fL — ABNORMAL HIGH (ref 80.0–100.0)
Platelets: 136 10*3/uL — ABNORMAL LOW (ref 150–400)
RBC: 4.58 MIL/uL (ref 3.87–5.11)
RDW: 13.7 % (ref 11.5–15.5)
WBC: 7.6 10*3/uL (ref 4.0–10.5)
nRBC: 0 % (ref 0.0–0.2)

## 2021-06-25 LAB — BASIC METABOLIC PANEL
Anion gap: 6 (ref 5–15)
BUN: 24 mg/dL — ABNORMAL HIGH (ref 8–23)
CO2: 29 mmol/L (ref 22–32)
Calcium: 8.9 mg/dL (ref 8.9–10.3)
Chloride: 96 mmol/L — ABNORMAL LOW (ref 98–111)
Creatinine, Ser: 0.84 mg/dL (ref 0.44–1.00)
GFR, Estimated: >60 mL/min (ref >60)
Glucose, Bld: 118 mg/dL — ABNORMAL HIGH (ref 70–99)
Potassium: 3.5 mmol/L (ref 3.5–5.1)
Sodium: 131 mmol/L — ABNORMAL LOW (ref 135–145)

## 2021-06-25 LAB — MAGNESIUM: Magnesium: 2 mg/dL (ref 1.7–2.4)

## 2021-06-25 MED ORDER — METOPROLOL SUCCINATE ER 25 MG PO TB24: 12.5000 mg | ORAL_TABLET | Freq: Two times a day (BID) | ORAL | Status: DC

## 2021-06-25 MED ORDER — MELATONIN 5 MG PO TABS: 5.0000 mg | ORAL_TABLET | Freq: Every day | ORAL | 0 refills | Status: AC

## 2021-06-25 MED ORDER — MORPHINE SULFATE 15 MG PO TABS: 7.5000 mg | ORAL_TABLET | Freq: Four times a day (QID) | ORAL | 0 refills | Status: AC | PRN

## 2021-06-25 NOTE — TOC Progression Note (Signed)
Transition of Care [facility]) - Progression Note    Patient Details  Name: [name] MRN: [id number] Date of Birth: 1941/02/07  Transition of Care [facility]) CM/SW Contact  Eileen Stanford, LCSW Phone Number: 06/25/2021, 1:53 PM  Clinical Narrative:   CSW confirmed with [facility] that pt has a bed there today. CSW spoke with pt and she is agreeable and aware. CSW also spoke to pt's daughter Anette requesting updated and she is also aware. Pt's daughter is requesting a call from RN when pt is picked up-- RN notified.     Expected Discharge Plan: Assisted Living Barriers to Discharge: Continued Medical Work up  Expected Discharge Plan and Services Expected Discharge Plan: Assisted Living       Living arrangements for the past 2 months: Assisted Living Facility                                       Social Determinants of Health (SDOH) Interventions    Readmission Risk Interventions No flowsheet data found.

## 2021-06-25 NOTE — Care Management Important Message (Signed)
Important Message  Patient Details  Name: [name] MRN: [id number] Date of Birth: 1940-10-20   Medicare Important Message Given:  Yes  Reviewed Medicare IM with patient via room phone due to isolation status.  Copy of Medicare IM mailed to patient's attention at home address on file.    Dannette Barbara 06/25/2021, 2:14 PM

## 2021-06-25 NOTE — Progress Notes (Addendum)
Mobility Specialist - Progress Note   06/25/21 1100  Mobility  Activity Ambulated in room;Transferred:  Bed to chair;Sat and stood x 3  Range of Motion/Exercises Active;Right leg;Left leg  Level of Assistance Standby assist, set-up cues, supervision of patient - no hands on  Assistive Device Front wheel walker  Distance Ambulated (ft) 5 ft  Mobility Out of bed to chair with meals;Ambulated with assistance in room  Mobility Response Tolerated well  Mobility performed by Mobility specialist  $Mobility charge 1 Mobility    Pre-mobility: 90 HR, 89% SpO2 During mobility: 98 HR, 88-92% SpO2 Post-mobility: 89 HR, 90% SpO2   Pt lying in bed upon arrival, utilizing 3L. Pt agreeable to mobility with min encouragement. Pt confused about discharge plans. States she was told she would be d/c today to [facility]" but notified pt current plans were to [facility] facility with author unaware of time frame. Labs entered for blood draw, pt initially reluctant but agreeable with encouragement from author. O2 in high 80s on arrival at rest, but stays > 88% throughout session. MinA to sit EOB with rest break needed with transfer. MinG and VC for sequencing during ambulation. Supervision and extra time to perform STSx4 (3 from recliner). Pt performed seated LE therex with cues on form and technique. Fatigued with activity. Pt left in chair with alarm set, needs in reach.    Kathee Delton Mobility Specialist 06/25/21, 11:50 AM

## 2021-06-25 NOTE — Progress Notes (Signed)
Progress Note  Patient Name: [name] Date of Encounter: 06/25/2021  [facility] Cardiologist: Peter Martinique, MD   Subjective   Patient overall feeling better. She is back to baseline O2. Awaiting d/c to rehab facility.   Inpatient Medications    Scheduled Meds:  apixaban  5 mg Oral BID   citalopram  20 mg Oral Daily   digoxin  0.125 mg Oral Daily   docusate sodium  100 mg Oral BID   furosemide  80 mg Oral Daily   gabapentin  100 mg Oral TID   levothyroxine  88 mcg Oral Daily   [START ON 06/26/2021] metoprolol succinate  12.5 mg Oral BID   sodium chloride flush  3 mL Intravenous Q12H   tiotropium  18 mcg Inhalation Daily   Continuous Infusions:  PRN Meds: acetaminophen **OR** acetaminophen, bisacodyl, levalbuterol, morphine, ondansetron **OR** ondansetron (ZOFRAN) IV, polyethylene glycol, traZODone   Vital Signs    Vitals:   06/24/21 2331 06/25/21 0500 06/25/21 0515 06/25/21 0749  BP: 110/63  106/66 108/75  Pulse: 77  77 99  Resp: 18  18 18   Temp: 97.9 F (36.6 C)  97.9 F (36.6 C) 98.3 F (36.8 C)  TempSrc:   Oral   SpO2: 91%  95% 92%  Weight:  86.6 kg    Height:        Intake/Output Summary (Last 24 hours) at 06/25/2021 0950 Last data filed at 06/25/2021 0500 Gross per 24 hour  Intake --  Output 1300 ml  Net -1300 ml   Last 3 Weights 06/25/2021 06/24/2021 06/24/2021  Weight (lbs) 190 lb 14.7 oz 191 lb 12.8 oz 193 lb 5.5 oz  Weight (kg) 86.6 kg 87 kg 87.7 kg      Telemetry     Afib HR around the 80s - Personally Reviewed  ECG     - Personally Reviewed  Physical Exam   GEN: No acute distress.   Neck: No JVD Cardiac: Irreg Irreg, no murmurs, rubs, or gallops.  Respiratory: Clear to auscultation bilaterally. GI: Soft, nontender, non-distended  MS: No edema; No deformity. Neuro:  Nonfocal  Psych: Normal affect   Labs    High Sensitivity Troponin:   Recent Labs  Lab 06/17/21 1802 06/18/21 0122 06/18/21 0322 06/18/21 2157  TROPONINIHS  35* 41* 42* 31*     Chemistry Recent Labs  Lab 06/21/21 0713 06/22/21 1032 06/24/21 1003  NA 137 135 133*  K 4.4 3.8 3.5  CL 98 97* 95*  CO2 31 32 30  GLUCOSE 94 101* 91  BUN 18 21 22   CREATININE 0.80 0.87 0.73  CALCIUM 9.1 9.1 9.0  GFRNONAA >60 >60 >60  ANIONGAP 8 6 8     Lipids No results for input(s): CHOL, TRIG, HDL, LABVLDL, LDLCALC, CHOLHDL in the last 168 hours.  Hematology Recent Labs  Lab 06/21/21 0713  WBC 6.1  RBC 4.82  HGB 16.7*  HCT 49.8*  MCV 103.3*  MCH 34.6*  MCHC 33.5  RDW 13.4  PLT 134*   Thyroid No results for input(s): TSH, FREET4 in the last 168 hours.  BNPNo results for input(s): BNP, PROBNP in the last 168 hours.  DDimer No results for input(s): DDIMER in the last 168 hours.   Radiology    No results found.  Cardiac Studies   TTE (06/18/2021): 1. Left ventricular ejection fraction, by estimation, is 35 to 40%. The  left ventricle has moderately decreased function. The left ventricle  demonstrates global hypokinesis. There is mild  left ventricular  hypertrophy. Left ventricular diastolic function   could not be evaluated.   2. Right ventricular systolic function is moderately reduced. The right  ventricular size is normal. There is moderately elevated pulmonary artery  systolic pressure.   3. Left atrial size was moderately dilated.   4. Right atrial size was moderately dilated.   5. The mitral valve is degenerative. Mild mitral valve regurgitation. No  evidence of mitral stenosis. Severe mitral annular calcification.   6. Tricuspid valve regurgitation is moderate.   7. The aortic valve is tricuspid. There is mild thickening of the aortic  valve. Aortic valve regurgitation is not visualized. Aortic valve  sclerosis is present, with no evidence of aortic valve stenosis.   8. The inferior vena cava is normal in size with greater than 50%  respiratory variability, suggesting right atrial pressure of 3 mmHg.   Patient Profile     81  y.o. female with history of chronic HFrEF, permanent atrial fibrillation, DVT on Eliquis, AAA s/p endovascular repair, HTN, HLD, and COPD on 3L O2, admitted with confusion and acute on chronic respiratory failure with hypoxia, found to be in atrial fibrillation with rapid ventricular response.  Assessment & Plan    Permanent Afib - rates controlled in afib - continue rate control with metoprolol - continue digoxin - Eliquis for stroke ppx  Acute on chronic HFrEF - Patient appears euvolemic - started on Losartan>>held for hypotension - continue metoprolol - continue lasix 80mg  daily  Acute on chronic respiratory failure COPD on home O2 OSA - patient is back to baseline 3 L O2  Hypotension - Losartan recently started>>held for hypotension - BP improved today - continue metoprolol  For questions or updates, please contact [place] Please consult [email or web address] for contact info under        Signed, Pasha Broad Ninfa Meeker, PA-C  06/25/2021, 9:50 AM

## 2021-06-25 NOTE — Progress Notes (Signed)
Mobility Specialist - Progress Note   06/25/21 1600  Mobility  Activity Transferred:  Chair to bed  Level of Assistance Minimal assist, patient does 75% or more  Assistive Device Front wheel walker  Mobility Sit up in bed/chair position for meals  Mobility Response Tolerated well  Mobility performed by Mobility specialist  $Mobility charge 1 Mobility    Pt sitting in recliner upon arrival, finishing lunch. Agreeable to return to bed. Pt stood x2 with minA from chair with RUE only as she voiced L shoulder pain 8/10. VC for sequencing turns with RW. Pt returned supine with support onto LE. Noted soiled gown d/t pure-wick malfunction, pt was assisted with peri-care and gown change. Pt left in bed with alarm set. RN notified.   Kathee Delton Mobility Specialist 06/25/21, 4:11 PM

## 2021-06-25 NOTE — TOC Transition Note (Signed)
Transition of Care [facility]) - CM/SW Discharge Note   Patient Details  Name: [name] MRN: [id number] Date of Birth: 10-03-1940  Transition [facility]) CM/SW Contact:  Eileen Stanford, LCSW Phone Number: 06/25/2021, 2:37 PM   Clinical Narrative:   Clinical Social Worker facilitated patient discharge including contacting patient family and facility to confirm patient discharge plans.  Clinical information faxed to facility and family agreeable with plan.  CSW arranged ambulance transport via [facility] to [facility] .  RN to call [phone] ask for 600 hall RN for report prior to discharge.      Final next level of care: Skilled Nursing Facility Barriers to Discharge: No Barriers Identified   Patient Goals and CMS Choice Patient states their goals for this hospitalization and ongoing recovery are:: to go home CMS Medicare.gov Compare Post Acute Care list provided to:: Patient Choice offered to / list presented to : Patient  Discharge Placement              Patient chooses bed at:  ([facility]) Patient to be transferred to facility by: [facility] Name of family member notified: daughter Anette Patient and family notified of of transfer: 06/25/21  Discharge Plan and Services                                     Social Determinants of Health (SDOH) Interventions     Readmission Risk Interventions No flowsheet data found.

## 2021-06-25 NOTE — Plan of Care (Signed)

## 2021-06-25 NOTE — Progress Notes (Signed)
OT Cancellation Note  Patient Details Name: [name] MRN: [id number] DOB: [birth year]   Cancelled Treatment:    Reason Eval/Treat Not Completed: Other (comment). Pt working with Risk analyst. Will re-attempt OT tx at later date/time as available.   Ardeth Perfect., MPH, MS, OTR/L ascom [phone] 06/25/21, 11:34 AM

## 2021-06-25 NOTE — Plan of Care (Signed)
Problem: Education: Goal: Knowledge of General Education information will improve Description: Including pain rating scale, medication(s)/side effects and non-pharmacologic comfort measures 06/25/2021 2238 by Bonner Puna, RN Outcome: Completed/Met 06/25/2021 2237 by Bonner Puna, RN Outcome: Progressing   Problem: Health Behavior/Discharge Planning: Goal: Ability to manage health-related needs will improve 06/25/2021 2238 by Bonner Puna, RN Outcome: Completed/Met 06/25/2021 2237 by Bonner Puna, RN Outcome: Progressing   Problem: Clinical Measurements: Goal: Ability to maintain clinical measurements within normal limits will improve 06/25/2021 2238 by Bonner Puna, RN Outcome: Completed/Met 06/25/2021 2237 by Bonner Puna, RN Outcome: Progressing Goal: Will remain free from infection 06/25/2021 2238 by Bonner Puna, RN Outcome: Completed/Met 06/25/2021 2237 by Bonner Puna, RN Outcome: Progressing Goal: Diagnostic test results will improve 06/25/2021 2238 by Bonner Puna, RN Outcome: Completed/Met 06/25/2021 2237 by Bonner Puna, RN Outcome: Progressing Goal: Respiratory complications will improve 06/25/2021 2238 by Bonner Puna, RN Outcome: Completed/Met 06/25/2021 2237 by Bonner Puna, RN Outcome: Progressing Goal: Cardiovascular complication will be avoided 06/25/2021 2238 by Bonner Puna, RN Outcome: Completed/Met 06/25/2021 2237 by Bonner Puna, RN Outcome: Progressing   Problem: Activity: Goal: Risk for activity intolerance will decrease 06/25/2021 2238 by Bonner Puna, RN Outcome: Completed/Met 06/25/2021 2237 by Bonner Puna, RN Outcome: Progressing   Problem: Nutrition: Goal: Adequate nutrition will be maintained 06/25/2021 2238 by Bonner Puna, RN Outcome: Completed/Met 06/25/2021 2237 by Bonner Puna, RN Outcome: Progressing   Problem: Coping: Goal: Level of anxiety will decrease 06/25/2021 2238 by Bonner Puna, RN Outcome: Completed/Met 06/25/2021 2237 by Bonner Puna, RN Outcome: Progressing    Problem: Elimination: Goal: Will not experience complications related to bowel motility 06/25/2021 2238 by Bonner Puna, RN Outcome: Completed/Met 06/25/2021 2237 by Bonner Puna, RN Outcome: Progressing Goal: Will not experience complications related to urinary retention 06/25/2021 2238 by Bonner Puna, RN Outcome: Completed/Met 06/25/2021 2237 by Bonner Puna, RN Outcome: Progressing   Problem: Pain Managment: Goal: General experience of comfort will improve 06/25/2021 2238 by Bonner Puna, RN Outcome: Completed/Met 06/25/2021 2237 by Bonner Puna, RN Outcome: Progressing   Problem: Safety: Goal: Ability to remain free from injury will improve 06/25/2021 2238 by Bonner Puna, RN Outcome: Completed/Met 06/25/2021 2237 by Bonner Puna, RN Outcome: Progressing   Problem: Skin Integrity: Goal: Risk for impaired skin integrity will decrease 06/25/2021 2238 by Bonner Puna, RN Outcome: Completed/Met 06/25/2021 2237 by Bonner Puna, RN Outcome: Progressing   Problem: Education: Goal: Ability to demonstrate management of disease process will improve 06/25/2021 2238 by Bonner Puna, RN Outcome: Completed/Met 06/25/2021 2237 by Bonner Puna, RN Outcome: Progressing Goal: Ability to verbalize understanding of medication therapies will improve 06/25/2021 2238 by Bonner Puna, RN Outcome: Completed/Met 06/25/2021 2237 by Bonner Puna, RN Outcome: Progressing Goal: Individualized Educational Video(s) 06/25/2021 2238 by Bonner Puna, RN Outcome: Completed/Met 06/25/2021 2237 by Bonner Puna, RN Outcome: Progressing   Problem: Activity: Goal: Capacity to carry out activities will improve 06/25/2021 2238 by Bonner Puna, RN Outcome: Completed/Met 06/25/2021 2237 by Bonner Puna, RN Outcome: Progressing   Problem: Cardiac: Goal: Ability to achieve and maintain adequate cardiopulmonary perfusion will improve 06/25/2021 2238 by Bonner Puna, RN Outcome: Completed/Met 06/25/2021 2237 by Bonner Puna, RN Outcome: Progressing

## 2021-06-25 NOTE — Discharge Summary (Signed)
[facility] Discharge Summary   Patient: Suhailah Kwan BHA:[id number]  PCP: Burnard Bunting, MD  Date of admission: 06/17/2021   Date of discharge:  06/25/2021     Discharge Diagnoses:  Principal Problem:   Acute on chronic respiratory failure with hypoxia ([place]) Active Problems:   COPD (chronic obstructive pulmonary disease) ([place])   Hypothyroid   HTN (hypertension)   Atrial fibrillation with RVR (H[facility])   Chronic combined systolic and diastolic CHF (congestive heart failure) ([place])   AMS (altered mental status)   Admitted From: ALF Disposition:  SNF   Recommendations for Outpatient Follow-up:  PCP: in 1-2 days Follow up LABS/TEST:  BMP in 1 wk   Diet recommendation: Cardiac diet  Activity: The patient is advised to gradually reintroduce usual activities, as tolerated  Discharge Condition: stable  Code Status: DNR   History of present illness: As per the H and P dictated on admission Hospital Course:  Mrs. Batty is a 81 y.o. female with a history of  persistent atrial fibrillation, chronic HFrEF, chronic respiratory failure with hypoxia, COPD, pulmonary hypertension, DVT, NSCLC status post chemoradiation, AAA status post endovascular repair, hypertension, hyperlipidemia, and obstructive sleep apnea who presented secondary to altered mental status and dyspnea from ALF and found to have acute on chronic respiratory failure due to rapid Afib. Assessment & Plan: Acute on chronic respiratory failure with hypoxia: Much improved. In the setting of OSA, COPD.  Currently back to her baseline Acute metabolic encephalopathy in the setting of cognitive deficits. Appears to have improved.  Atrial fibrillation with RVR. Cardiology consulted and recommendations given. Resume eliquis for anti coagulation. Continue with metoprolol and digoxin.  Currently heart rate is controlled. Chronic combined systolic and diastolic CHF; Resume lasix and metoprolol, d.c losartan due to low BP  parameters.  Hypotension: Discontinue losartan.  BP stable now, continue to monitor and titrate medications accordingly. Hypothyroidism: Resume Synthroid.  COPD: No wheezing heard on exam. Resume bronchodilators.  Body mass index is 30.81 kg/m.  Nutrition Interventions:     Pain control  - [place] Controlled Substance Reporting System database was reviewed. - 5 day supply was provided. - Patient was instructed, not to drive, operate heavy machinery, perform activities at heights, swimming or participation in water activities or provide baby sitting services while on Pain, Sleep and Anxiety Medications; until her outpatient Physician has advised to do so again.  - Also recommended to not to take more than prescribed Pain, Sleep and Anxiety Medications.  Patient was seen by physical therapy, who recommended SNF, which was arranged. On the day of the discharge the patient's vitals were stable, and no other acute medical condition were reported by patient. the patient was felt safe to be discharge at [facility].  Consultants: Cradio Procedures: none  Discharge Exam: General: Appear in no distress, no Rash; Oral Mucosa Clear, moist. Cardiovascular: S1 and S2 Present, no Murmur, Respiratory: normal respiratory effort, Bilateral Air entry present and no Crackles, no wheezes Abdomen: Bowel Sound present, Soft and no tenderness, no hernia Extremities: mild Pedal edema, no calf tenderness Neurology: alert and oriented to time, place, and person affect appropriate.  Filed Weights   06/24/21 0134 06/24/21 0559 06/25/21 0500  Weight: 87.7 kg 87 kg 86.6 kg   Vitals:   06/25/21 0749 06/25/21 1100  BP: 108/75 115/73  Pulse: 99 97  Resp: 18 18  Temp: 98.3 F (36.8 C) 98.5 F (36.9 C)  SpO2: 92% 96%    DISCHARGE MEDICATION: Allergies as of 06/25/2021  No Known Allergies      Medication List     STOP taking these medications    guaiFENesin 600 MG 12 hr tablet Commonly known as:  MUCINEX   temazepam 15 MG capsule Commonly known as: RESTORIL       TAKE these medications    acetaminophen 500 MG tablet Commonly known as: TYLENOL Take 2 tablets (1,000 mg total) by mouth every 6 (six) hours as needed for fever, headache, mild pain or moderate pain (backpain).   albuterol 108 (90 Base) MCG/ACT inhaler Commonly known as: Ventolin HFA TAKE 2 PUFFS EVERY 6 HOURS AS NEEDED FORSHORTNESS OF BREATH/WHEEZING   apixaban 5 MG Tabs tablet Commonly known as: ELIQUIS Take 1 tablet (5 mg total) by mouth 2 (two) times daily.   citalopram 20 MG tablet Commonly known as: CELEXA Take 20 mg by mouth daily.   digoxin 0.125 MG tablet Commonly known as: LANOXIN Take 1 tablet (125 mcg total) by mouth daily.   furosemide 40 MG tablet Commonly known as: LASIX Take 2 tablets ( 80 mg ) daily   gabapentin 100 MG capsule Commonly known as: NEURONTIN Take 1 capsule by mouth 3 (three) times daily.   levothyroxine 88 MCG tablet Commonly known as: SYNTHROID Take 88 mcg by mouth daily.   megestrol 40 MG tablet Commonly known as: MEGACE Take 40 mg by mouth 2 (two) times daily.   melatonin 5 MG Tabs Take 1 tablet (5 mg total) by mouth at bedtime.   metolazone 2.5 MG tablet Commonly known as: ZAROXOLYN Take 1 tablet (2.5 mg total) by mouth daily as needed (ONE DAILY PRN WEIGHT GAIN). USE AS NEEDED FOR WEIGHT GAIN   metoprolol tartrate 25 MG tablet Commonly known as: LOPRESSOR TAKE 1/2 TABLET TWICE DAILY What changed:  how much to take how to take this when to take this additional instructions   morphine 15 MG tablet Commonly known as: MSIR Take 0.5 tablets (7.5 mg total) by mouth every 6 (six) hours as needed. What changed:  how much to take when to take this   mupirocin ointment 2 % Commonly known as: BACTROBAN mupirocin 2 % topical ointment  APPLY TO WOUNDS ON SKIN ONCE DAILY AT BANDAGE CHANGES   omeprazole 40 MG capsule Commonly known as: PRILOSEC Take 40  mg by mouth daily.   OXYGEN Inhale 4 L into the lungs daily. Patient states she wears 2L at night and 4L when up and moving   pantoprazole 40 MG tablet Commonly known as: PROTONIX Take 1 tablet (40 mg total) by mouth daily.   potassium chloride 10 MEQ tablet Commonly known as: KLOR-CON Take 1 tablet (10 mEq total) by mouth 2 (two) times daily.   Spiriva HandiHaler 18 MCG inhalation capsule Generic drug: tiotropium Place 1 capsule (18 mcg total) into inhaler and inhale daily.   vitamin B-12 100 MCG tablet Commonly known as: CYANOCOBALAMIN Take 100 mcg by mouth daily.   Vitamin D 1000 units capsule Take 1,000 Units by mouth daily.               Durable Medical Equipment  (From admission, onward)           Start     Ordered   06/20/21 1643  For home use only DME Bedside commode  Once       Question Answer Comment  Patient needs a bedside commode to treat with the following condition Acute on chronic respiratory failure with hypoxia [facility])   Patient needs a  bedside commode to treat with the following condition Atrial fibrillation with RVR ([place])      06/20/21 1642   06/20/21 1642  For home use only DME Walker rolling  Once       Question Answer Comment  Walker: With 5 Inch Wheels   Patient needs a walker to treat with the following condition Atrial fibrillation with RVR ([place])   Patient needs a walker to treat with the following condition Acute on chronic respiratory failure with hypoxia ([place])      06/20/21 1642           No Known Allergies Discharge Instructions     ([place]) Call MD:  Anytime you have any of the following symptoms: 1) 3 pound weight gain in 24 hours or 5 pounds in 1 week 2) shortness of breath, with or without a dry hacking cough 3) swelling in the hands, feet or stomach 4) if you have to sleep on extra pillows at night in order to breathe.   Complete by: As directed    Call MD for:  difficulty breathing, headache or visual  disturbances   Complete by: As directed    Call MD for:  extreme fatigue   Complete by: As directed    Call MD for:  persistant dizziness or light-headedness   Complete by: As directed    Call MD for:  severe uncontrolled pain   Complete by: As directed    Call MD for:  temperature >100.4   Complete by: As directed    Diet - low sodium heart healthy   Complete by: As directed    Discharge instructions   Complete by: As directed    Follow with PCP, patient should be seen by an MD in 1 to 2 days, repeat BMP after 1 week to check sodium level, potassium level and renal functions   Heart Failure patients record your daily weight using the same scale at the same time of day   Complete by: As directed    Increase activity slowly   Complete by: As directed    STOP any activity that causes chest pain, shortness of breath, dizziness, sweating, or exessive weakness   Complete by: As directed        The results of significant diagnostics from this hospitalization (including imaging, microbiology, ancillary and laboratory) are listed below for reference.    Significant Diagnostic Studies: CT HEAD WO CONTRAST (5MM)  Result Date: 06/17/2021 CLINICAL DATA:  Head trauma unwitnessed fall EXAM: CT HEAD WITHOUT CONTRAST TECHNIQUE: Contiguous axial images were obtained from the base of the skull through the vertex without intravenous contrast. COMPARISON:  CT brain 07/05/2014 FINDINGS: Brain: No acute territorial infarction, hemorrhage or intracranial mass. Extensive white matter hypodensity progressed compared to prior. Age indeterminate hypodensity in the right thalamus. Normal ventricle size. Mild atrophy. Vascular: No hyperdense vessels.  Carotid vascular calcification Skull: Normal. Negative for fracture or focal lesion. Sinuses/Orbits: Opacified left maxillary sinus. Other: None IMPRESSION: 1. No definite CT evidence for acute intracranial abnormality. 2. Small age indeterminate hypodensity in the  right thalamus which may reflect lacunar infarct or small vessel disease. Extensive white matter hypodensity consistent with chronic small vessel ischemic change, progressed compared to prior head CT. Electronically Signed   By: Donavan Foil M.D.   On: 06/17/2021 19:18   DG Chest Portable 1 View  Result Date: 06/17/2021 CLINICAL DATA:  Hypoxia EXAM: PORTABLE CHEST 1 VIEW COMPARISON:  May 05, 2021 FINDINGS: Enlarged cardiac silhouette,  similar prior. Low lung volumes with bibasilar atelectasis. No significant interval change in the persistent bilateral interstitial thickening. No new focal consolidation. No visible pleural effusion or pneumothorax. The visualized skeletal structures are unchanged. IMPRESSION: 1. No significant interval change in the persistent bilateral interstitial thickening with superimposed atelectasis. 2. Stable cardiomegaly. Electronically Signed   By: Dahlia Bailiff M.D.   On: 06/17/2021 16:10   ECHOCARDIOGRAM COMPLETE  Result Date: 06/18/2021    ECHOCARDIOGRAM REPORT   Patient Name:   NADEZHDA POLLITT Date of Exam: 06/18/2021 Medical Rec #:  [id number]        Height:       66.0 in Accession #:    [id number]       Weight:       205.7 lb Date of Birth:  Jan 30, 1941        BSA:          2.024 m Patient Age:    29 years         BP:           146/95 mmHg Patient Gender: F                HR:           141 bpm. Exam Location:  [facility] Procedure: 2D Echo, Cardiac Doppler and Color Doppler Indications:     CHF-acute systolic 573.22 / G25.42  History:         Patient has prior history of Echocardiogram examinations, most                  recent 03/21/2021. COPD, Arrythmias:Atrial Fibrillation and                  Atrial Flutter; Risk Factors:Hypertension and Dyslipidemia.  Sonographer:     Sherrie Sport Referring Phys:  [id number] Gretta Cool PATEL Diagnosing Phys: Nelva Bush MD  Sonographer Comments: Suboptimal apical window. IMPRESSIONS  1. Left ventricular ejection fraction, by estimation, is 35  to 40%. The left ventricle has moderately decreased function. The left ventricle demonstrates global hypokinesis. There is mild left ventricular hypertrophy. Left ventricular diastolic function  could not be evaluated.  2. Right ventricular systolic function is moderately reduced. The right ventricular size is normal. There is moderately elevated pulmonary artery systolic pressure.  3. Left atrial size was moderately dilated.  4. Right atrial size was moderately dilated.  5. The mitral valve is degenerative. Mild mitral valve regurgitation. No evidence of mitral stenosis. Severe mitral annular calcification.  6. Tricuspid valve regurgitation is moderate.  7. The aortic valve is tricuspid. There is mild thickening of the aortic valve. Aortic valve regurgitation is not visualized. Aortic valve sclerosis is present, with no evidence of aortic valve stenosis.  8. The inferior vena cava is normal in size with greater than 50% respiratory variability, suggesting right atrial pressure of 3 mmHg. FINDINGS  Left Ventricle: Left ventricular ejection fraction, by estimation, is 35 to 40%. The left ventricle has moderately decreased function. The left ventricle demonstrates global hypokinesis. The left ventricular internal cavity size was normal in size. There is mild left ventricular hypertrophy. Left ventricular diastolic function could not be evaluated due to atrial fibrillation. Left ventricular diastolic function could not be evaluated. Right Ventricle: The right ventricular size is normal. No increase in right ventricular wall thickness. Right ventricular systolic function is moderately reduced. There is moderately elevated pulmonary artery systolic pressure. The tricuspid regurgitant velocity is 3.35 m/s, and with an assumed right atrial pressure  of 3 mmHg, the estimated right ventricular systolic pressure is 95.2 mmHg. Left Atrium: Left atrial size was moderately dilated. Right Atrium: Right atrial size was moderately  dilated. Pericardium: Trivial pericardial effusion is present. Mitral Valve: The mitral valve is degenerative in appearance. There is mild thickening of the mitral valve leaflet(s). Severe mitral annular calcification. Mild mitral valve regurgitation. No evidence of mitral valve stenosis. MV peak gradient, 3.3 mmHg.  The mean mitral valve gradient is 1.0 mmHg. Tricuspid Valve: The tricuspid valve is normal in structure. Tricuspid valve regurgitation is moderate. Aortic Valve: The aortic valve is tricuspid. There is mild thickening of the aortic valve. Aortic valve regurgitation is not visualized. Aortic valve sclerosis is present, with no evidence of aortic valve stenosis. Aortic valve mean gradient measures 2.0  mmHg. Aortic valve peak gradient measures 4.0 mmHg. Aortic valve area, by VTI measures 2.72 cm. Pulmonic Valve: The pulmonic valve was grossly normal. Pulmonic valve regurgitation is trivial. No evidence of pulmonic stenosis. Aorta: The aortic root is normal in size and structure. Venous: The inferior vena cava is normal in size with greater than 50% respiratory variability, suggesting right atrial pressure of 3 mmHg. IAS/Shunts: No atrial level shunt detected by color flow Doppler.  LEFT VENTRICLE PLAX 2D LVIDd:         4.20 cm LVIDs:         3.40 cm LV PW:         1.20 cm LV IVS:        1.10 cm LVOT diam:     2.00 cm LV SV:         32 LV SV Index:   16 LVOT Area:     3.14 cm  RIGHT VENTRICLE RV Basal diam:  3.50 cm RV S prime:     10.40 cm/s LEFT ATRIUM             Index        RIGHT ATRIUM           Index LA diam:        3.20 cm 1.58 cm/m   RA Area:     25.00 cm LA Vol (A2C):   99.1 ml 48.97 ml/m  RA Volume:   82.60 ml  40.81 ml/m LA Vol (A4C):   83.5 ml 41.26 ml/m LA Biplane Vol: 95.8 ml 47.34 ml/m  AORTIC VALVE                    PULMONIC VALVE AV Area (Vmax):    2.26 cm     PV Vmax:        0.61 m/s AV Area (Vmean):   2.15 cm     PV Vmean:       40.400 cm/s AV Area (VTI):     2.72 cm     PV  VTI:         0.077 m AV Vmax:           100.00 cm/s  PV Peak grad:   1.5 mmHg AV Vmean:          61.200 cm/s  PV Mean grad:   1.0 mmHg AV VTI:            0.118 m      RVOT Peak grad: 3 mmHg AV Peak Grad:      4.0 mmHg AV Mean Grad:      2.0 mmHg LVOT Vmax:         71.80 cm/s LVOT  Vmean:        41.800 cm/s LVOT VTI:          0.102 m LVOT/AV VTI ratio: 0.86  AORTA Ao Root diam: 3.20 cm MITRAL VALVE                TRICUSPID VALVE MV Area (PHT): 6.12 cm     TR Peak grad:   44.9 mmHg MV Area VTI:   2.91 cm     TR Vmax:        335.00 cm/s MV Peak grad:  3.3 mmHg MV Mean grad:  1.0 mmHg     SHUNTS MV Vmax:       0.90 m/s     Systemic VTI:  0.10 m MV Vmean:      52.9 cm/s    Systemic Diam: 2.00 cm MV Decel Time: 124 msec     Pulmonic VTI:  0.101 m MV E velocity: 119.00 cm/s Nelva Bush MD Electronically signed by Nelva Bush MD Signature Date/Time: 06/18/2021/11:44:19 AM    Final     Microbiology: Recent Results (from the past 240 hour(s))  Resp Panel by RT-PCR (Flu A&B, Covid) Nasopharyngeal Swab     Status: None   Collection Time: 06/17/21  6:02 PM   Specimen: Nasopharyngeal Swab; Nasopharyngeal(NP) swabs in vial transport medium  Result Value Ref Range Status   SARS Coronavirus 2 by RT PCR NEGATIVE NEGATIVE Final    Comment: (NOTE) SARS-CoV-2 target nucleic acids are NOT DETECTED.  The SARS-CoV-2 RNA is generally detectable in upper respiratory specimens during the acute phase of infection. The lowest concentration of SARS-CoV-2 viral copies this assay can detect is 138 copies/mL. A negative result does not preclude SARS-Cov-2 infection and should not be used as the sole basis for treatment or other patient management decisions. A negative result may occur with  improper specimen collection/handling, submission of specimen other than nasopharyngeal swab, presence of viral mutation(s) within the areas targeted by this assay, and inadequate number of viral copies(<138 copies/mL). A negative  result must be combined with clinical observations, patient history, and epidemiological information. The expected result is Negative.  Fact Sheet for Patients:  EntrepreneurPulse.com.au  Fact Sheet for Healthcare Providers:  IncredibleEmployment.be  This test is no t yet approved or cleared by the [place] FDA and  has been authorized for detection and/or diagnosis of SARS-CoV-2 by FDA under an Emergency Use Authorization (EUA). This EUA will remain  in effect (meaning this test can be used) for the duration of the COVID-19 declaration under Section 564(b)(1) of the Act, 21 U.S.C.section 360bbb-3(b)(1), unless the authorization is terminated  or revoked sooner.       Influenza A by PCR NEGATIVE NEGATIVE Final   Influenza B by PCR NEGATIVE NEGATIVE Final    Comment: (NOTE) The Xpert Xpress SARS-CoV-2/FLU/RSV plus assay is intended as an aid in the diagnosis of influenza from Nasopharyngeal swab specimens and should not be used as a sole basis for treatment. Nasal washings and aspirates are unacceptable for Xpert Xpress SARS-CoV-2/FLU/RSV testing.  Fact Sheet for Patients: EntrepreneurPulse.com.au  Fact Sheet for Healthcare Providers: IncredibleEmployment.be  This test is not yet approved or cleared by the [place] FDA and has been authorized for detection and/or diagnosis of SARS-CoV-2 by FDA under an Emergency Use Authorization (EUA). This EUA will remain in effect (meaning this test can be used) for the duration of the COVID-19 declaration under Section 564(b)(1) of the Act, 21 U.S.C. section 360bbb-3(b)(1), unless the authorization is terminated  or revoked.  Performed at [facility], [address] [place], [place] [address]   MRSA Next Gen by PCR, Nasal     Status: None   Collection Time: 06/19/21  4:47 AM   Specimen: Nasal Mucosa; Nasal Swab  Result Value Ref Range Status    MRSA by PCR Next Gen NOT DETECTED NOT DETECTED Final    Comment: (NOTE) The GeneXpert MRSA Assay (FDA approved for NASAL specimens only), is one component of a comprehensive MRSA colonization surveillance program. It is not intended to diagnose MRSA infection nor to guide or monitor treatment for MRSA infections. Test performance is not FDA approved in patients less than 70 years old. Performed at [facility], [address] [place], [place] [address]      Labs: CBC: Recent Labs  Lab 06/21/21 [id number] 06/25/21 1116  WBC 6.1 7.6  HGB 16.7* 15.9*  HCT 49.8* 47.1*  MCV 103.3* 102.8*  PLT 134* 275*   Basic Metabolic Panel: Recent Labs  Lab 06/20/21 0643 06/21/21 0713 06/22/21 1032 06/24/21 1003 06/25/21 1116  NA 135 137 135 133* 131*  K 3.5 4.4 3.8 3.5 3.5  CL 99 98 97* 95* 96*  CO2 29 31 32 30 29  GLUCOSE 91 94 101* 91 118*  BUN 16 18 21 22  24*  CREATININE 0.68 0.80 0.87 0.73 0.84  CALCIUM 8.8* 9.1 9.1 9.0 8.9  MG  --   --   --   --  2.0  PHOS  --   --   --   --  3.3   Liver Function Tests: No results for input(s): AST, ALT, ALKPHOS, BILITOT, PROT, ALBUMIN in the last 168 hours. No results for input(s): LIPASE, AMYLASE in the last 168 hours. No results for input(s): AMMONIA in the last 168 hours. Cardiac Enzymes: No results for input(s): CKTOTAL, CKMB, CKMBINDEX, TROPONINI in the last 168 hours. BNP (last 3 results) Recent Labs    03/07/21 1223 05/05/21 0246 06/17/21 1802  BNP 386.7* 249.5* 363.5*   CBG: No results for input(s): GLUCAP in the last 168 hours.  Time spent: 35 minutes  Signed:  Val Riles  [facility]  06/25/2021 2:00 PM

## 2021-06-25 NOTE — Progress Notes (Signed)
Report given to [facility].

## 2021-06-26 DIAGNOSIS — G4733 Obstructive sleep apnea (adult) (pediatric): Secondary | ICD-10-CM | POA: Diagnosis not present

## 2021-06-26 DIAGNOSIS — J449 Chronic obstructive pulmonary disease, unspecified: Secondary | ICD-10-CM | POA: Diagnosis not present

## 2021-06-26 DIAGNOSIS — J9620 Acute and chronic respiratory failure, unspecified whether with hypoxia or hypercapnia: Secondary | ICD-10-CM | POA: Diagnosis not present

## 2021-06-26 DIAGNOSIS — I4891 Unspecified atrial fibrillation: Secondary | ICD-10-CM | POA: Diagnosis not present

## 2021-06-26 DIAGNOSIS — I509 Heart failure, unspecified: Secondary | ICD-10-CM | POA: Diagnosis not present

## 2021-06-27 DIAGNOSIS — I4891 Unspecified atrial fibrillation: Secondary | ICD-10-CM | POA: Diagnosis not present

## 2021-06-27 DIAGNOSIS — J9620 Acute and chronic respiratory failure, unspecified whether with hypoxia or hypercapnia: Secondary | ICD-10-CM | POA: Diagnosis not present

## 2021-06-27 DIAGNOSIS — G4733 Obstructive sleep apnea (adult) (pediatric): Secondary | ICD-10-CM | POA: Diagnosis not present

## 2021-06-27 DIAGNOSIS — J449 Chronic obstructive pulmonary disease, unspecified: Secondary | ICD-10-CM | POA: Diagnosis not present

## 2021-06-27 DIAGNOSIS — I509 Heart failure, unspecified: Secondary | ICD-10-CM | POA: Diagnosis not present

## 2021-06-29 NOTE — Progress Notes (Signed)
°   06/24/21 1203  Assess: MEWS Score  BP (!) 71/60  Pulse Rate 76  SpO2 95 %  Assess: MEWS Score  MEWS Temp 0  MEWS Systolic 2  MEWS Pulse 0  MEWS RR 0  MEWS LOC 0  MEWS Score 2  MEWS Score Color Yellow  Assess: if the MEWS score is Yellow or Red  Were vital signs taken at a resting state? Yes  Focused Assessment No change from prior assessment  Does the patient meet 2 or more of the SIRS criteria? No  MEWS guidelines implemented *See Row Information* Yes  Treat  MEWS Interventions Escalated (See documentation below)  Take Vital Signs  Increase Vital Sign Frequency  Yellow: Q 2hr X 2 then Q 4hr X 2, if remains yellow, continue Q 4hrs  Escalate  MEWS: Escalate Yellow: discuss with charge nurse/RN and consider discussing with provider and RRT  Notify: Charge Nurse/RN  Name of Charge Nurse/RN Notified Brittney, RN  Date Charge Nurse/RN Notified 06/24/21  Time Charge Nurse/RN Notified 1205  Assess: SIRS CRITERIA  SIRS Temperature  0  SIRS Pulse 0  SIRS Respirations  0  SIRS WBC 0  SIRS Score Sum  0

## 2021-07-01 DIAGNOSIS — J449 Chronic obstructive pulmonary disease, unspecified: Secondary | ICD-10-CM | POA: Diagnosis not present

## 2021-07-01 DIAGNOSIS — I4891 Unspecified atrial fibrillation: Secondary | ICD-10-CM | POA: Diagnosis not present

## 2021-07-04 DIAGNOSIS — G4733 Obstructive sleep apnea (adult) (pediatric): Secondary | ICD-10-CM | POA: Diagnosis not present

## 2021-07-04 DIAGNOSIS — I4891 Unspecified atrial fibrillation: Secondary | ICD-10-CM | POA: Diagnosis not present

## 2021-07-04 DIAGNOSIS — M6281 Muscle weakness (generalized): Secondary | ICD-10-CM | POA: Diagnosis not present

## 2021-07-04 DIAGNOSIS — J449 Chronic obstructive pulmonary disease, unspecified: Secondary | ICD-10-CM | POA: Diagnosis not present

## 2021-07-04 DIAGNOSIS — I509 Heart failure, unspecified: Secondary | ICD-10-CM | POA: Diagnosis not present

## 2021-07-06 DIAGNOSIS — J9621 Acute and chronic respiratory failure with hypoxia: Secondary | ICD-10-CM | POA: Diagnosis not present

## 2021-07-06 DIAGNOSIS — N390 Urinary tract infection, site not specified: Secondary | ICD-10-CM | POA: Diagnosis not present

## 2021-07-06 DIAGNOSIS — I5042 Chronic combined systolic (congestive) and diastolic (congestive) heart failure: Secondary | ICD-10-CM | POA: Diagnosis not present

## 2021-07-06 DIAGNOSIS — A4151 Sepsis due to Escherichia coli [E. coli]: Secondary | ICD-10-CM | POA: Diagnosis not present

## 2021-07-06 DIAGNOSIS — I110 Hypertensive heart disease with heart failure: Secondary | ICD-10-CM | POA: Diagnosis not present

## 2021-07-06 DIAGNOSIS — J449 Chronic obstructive pulmonary disease, unspecified: Secondary | ICD-10-CM | POA: Diagnosis not present

## 2021-07-10 DIAGNOSIS — N390 Urinary tract infection, site not specified: Secondary | ICD-10-CM | POA: Diagnosis not present

## 2021-07-10 DIAGNOSIS — A4151 Sepsis due to Escherichia coli [E. coli]: Secondary | ICD-10-CM | POA: Diagnosis not present

## 2021-07-10 DIAGNOSIS — I110 Hypertensive heart disease with heart failure: Secondary | ICD-10-CM | POA: Diagnosis not present

## 2021-07-10 DIAGNOSIS — J449 Chronic obstructive pulmonary disease, unspecified: Secondary | ICD-10-CM | POA: Diagnosis not present

## 2021-07-10 DIAGNOSIS — J9621 Acute and chronic respiratory failure with hypoxia: Secondary | ICD-10-CM | POA: Diagnosis not present

## 2021-07-10 DIAGNOSIS — I5042 Chronic combined systolic (congestive) and diastolic (congestive) heart failure: Secondary | ICD-10-CM | POA: Diagnosis not present

## 2021-07-11 DIAGNOSIS — J9621 Acute and chronic respiratory failure with hypoxia: Secondary | ICD-10-CM | POA: Diagnosis not present

## 2021-07-11 DIAGNOSIS — I4891 Unspecified atrial fibrillation: Secondary | ICD-10-CM | POA: Diagnosis not present

## 2021-07-11 DIAGNOSIS — I5042 Chronic combined systolic (congestive) and diastolic (congestive) heart failure: Secondary | ICD-10-CM | POA: Diagnosis not present

## 2021-07-11 DIAGNOSIS — I110 Hypertensive heart disease with heart failure: Secondary | ICD-10-CM | POA: Diagnosis not present

## 2021-07-11 DIAGNOSIS — J449 Chronic obstructive pulmonary disease, unspecified: Secondary | ICD-10-CM | POA: Diagnosis not present

## 2021-07-11 DIAGNOSIS — J9611 Chronic respiratory failure with hypoxia: Secondary | ICD-10-CM | POA: Diagnosis not present

## 2021-07-11 DIAGNOSIS — N390 Urinary tract infection, site not specified: Secondary | ICD-10-CM | POA: Diagnosis not present

## 2021-07-11 DIAGNOSIS — A4151 Sepsis due to Escherichia coli [E. coli]: Secondary | ICD-10-CM | POA: Diagnosis not present

## 2021-07-15 ENCOUNTER — Encounter: Payer: Self-pay | Admitting: Internal Medicine

## 2021-07-15 ENCOUNTER — Emergency Department: Payer: Medicare Other

## 2021-07-15 ENCOUNTER — Other Ambulatory Visit: Payer: Self-pay

## 2021-07-15 ENCOUNTER — Observation Stay
Admission: EM | Admit: 2021-07-15 | Discharge: 2021-07-16 | Disposition: A | Discharge status: Home / Self Care | Payer: Medicare Other | Attending: Hospitalist | Admitting: Hospitalist

## 2021-07-15 DIAGNOSIS — I4891 Unspecified atrial fibrillation: Secondary | ICD-10-CM | POA: Diagnosis not present

## 2021-07-15 DIAGNOSIS — Z79899 Other long term (current) drug therapy: Secondary | ICD-10-CM | POA: Insufficient documentation

## 2021-07-15 DIAGNOSIS — M545 Low back pain, unspecified: Secondary | ICD-10-CM

## 2021-07-15 DIAGNOSIS — W19XXXA Unspecified fall, initial encounter: Secondary | ICD-10-CM

## 2021-07-15 DIAGNOSIS — M1611 Unilateral primary osteoarthritis, right hip: Secondary | ICD-10-CM | POA: Diagnosis not present

## 2021-07-15 DIAGNOSIS — R531 Weakness: Secondary | ICD-10-CM

## 2021-07-15 DIAGNOSIS — R079 Chest pain, unspecified: Secondary | ICD-10-CM | POA: Diagnosis not present

## 2021-07-15 DIAGNOSIS — Z85110 Personal history of malignant carcinoid tumor of bronchus and lung: Secondary | ICD-10-CM | POA: Insufficient documentation

## 2021-07-15 DIAGNOSIS — M25512 Pain in left shoulder: Secondary | ICD-10-CM | POA: Diagnosis not present

## 2021-07-15 DIAGNOSIS — I517 Cardiomegaly: Secondary | ICD-10-CM | POA: Diagnosis not present

## 2021-07-15 DIAGNOSIS — J9611 Chronic respiratory failure with hypoxia: Secondary | ICD-10-CM | POA: Diagnosis not present

## 2021-07-15 DIAGNOSIS — J449 Chronic obstructive pulmonary disease, unspecified: Secondary | ICD-10-CM | POA: Insufficient documentation

## 2021-07-15 DIAGNOSIS — Z86718 Personal history of other venous thrombosis and embolism: Secondary | ICD-10-CM | POA: Diagnosis not present

## 2021-07-15 DIAGNOSIS — Z20822 Contact with and (suspected) exposure to covid-19: Secondary | ICD-10-CM | POA: Diagnosis not present

## 2021-07-15 DIAGNOSIS — Z87891 Personal history of nicotine dependence: Secondary | ICD-10-CM | POA: Insufficient documentation

## 2021-07-15 DIAGNOSIS — I6782 Cerebral ischemia: Secondary | ICD-10-CM | POA: Diagnosis not present

## 2021-07-15 DIAGNOSIS — R52 Pain, unspecified: Secondary | ICD-10-CM

## 2021-07-15 DIAGNOSIS — R296 Repeated falls: Principal | ICD-10-CM | POA: Diagnosis present

## 2021-07-15 DIAGNOSIS — R102 Pelvic and perineal pain: Secondary | ICD-10-CM | POA: Diagnosis not present

## 2021-07-15 DIAGNOSIS — S0990XA Unspecified injury of head, initial encounter: Secondary | ICD-10-CM | POA: Diagnosis not present

## 2021-07-15 DIAGNOSIS — M19012 Primary osteoarthritis, left shoulder: Secondary | ICD-10-CM | POA: Diagnosis not present

## 2021-07-15 DIAGNOSIS — M4126 Other idiopathic scoliosis, lumbar region: Secondary | ICD-10-CM | POA: Diagnosis not present

## 2021-07-15 DIAGNOSIS — Y92129 Unspecified place in nursing home as the place of occurrence of the external cause: Secondary | ICD-10-CM | POA: Insufficient documentation

## 2021-07-15 DIAGNOSIS — W06XXXA Fall from bed, initial encounter: Secondary | ICD-10-CM | POA: Diagnosis not present

## 2021-07-15 DIAGNOSIS — I5022 Chronic systolic (congestive) heart failure: Secondary | ICD-10-CM

## 2021-07-15 DIAGNOSIS — G319 Degenerative disease of nervous system, unspecified: Secondary | ICD-10-CM | POA: Diagnosis not present

## 2021-07-15 DIAGNOSIS — B961 Klebsiella pneumoniae [K. pneumoniae] as the cause of diseases classified elsewhere: Secondary | ICD-10-CM | POA: Diagnosis not present

## 2021-07-15 DIAGNOSIS — Z7901 Long term (current) use of anticoagulants: Secondary | ICD-10-CM | POA: Diagnosis not present

## 2021-07-15 DIAGNOSIS — G8929 Other chronic pain: Secondary | ICD-10-CM | POA: Diagnosis not present

## 2021-07-15 DIAGNOSIS — E039 Hypothyroidism, unspecified: Secondary | ICD-10-CM | POA: Diagnosis not present

## 2021-07-15 DIAGNOSIS — I10 Essential (primary) hypertension: Secondary | ICD-10-CM | POA: Diagnosis present

## 2021-07-15 DIAGNOSIS — N390 Urinary tract infection, site not specified: Secondary | ICD-10-CM

## 2021-07-15 DIAGNOSIS — Z96652 Presence of left artificial knee joint: Secondary | ICD-10-CM | POA: Diagnosis not present

## 2021-07-15 DIAGNOSIS — I5042 Chronic combined systolic (congestive) and diastolic (congestive) heart failure: Secondary | ICD-10-CM | POA: Insufficient documentation

## 2021-07-15 DIAGNOSIS — I110 Hypertensive heart disease with heart failure: Secondary | ICD-10-CM | POA: Insufficient documentation

## 2021-07-15 LAB — COMPREHENSIVE METABOLIC PANEL
ALT: 18 U/L (ref 0–44)
AST: 27 U/L (ref 15–41)
Albumin: 3.8 g/dL (ref 3.5–5.0)
Alkaline Phosphatase: 56 U/L (ref 38–126)
Anion gap: 9 (ref 5–15)
BUN: 17 mg/dL (ref 8–23)
CO2: 26 mmol/L (ref 22–32)
Calcium: 9.1 mg/dL (ref 8.9–10.3)
Chloride: 102 mmol/L (ref 98–111)
Creatinine, Ser: 1.07 mg/dL — ABNORMAL HIGH (ref 0.44–1.00)
GFR, Estimated: 52 mL/min — ABNORMAL LOW (ref >60)
Glucose, Bld: 153 mg/dL — ABNORMAL HIGH (ref 70–99)
Potassium: 4 mmol/L (ref 3.5–5.1)
Sodium: 137 mmol/L (ref 135–145)
Total Bilirubin: 1.4 mg/dL — ABNORMAL HIGH (ref 0.3–1.2)
Total Protein: 7.5 g/dL (ref 6.5–8.1)

## 2021-07-15 LAB — CBC WITH DIFFERENTIAL/PLATELET
Abs Immature Granulocytes: 0.02 10*3/uL (ref 0.00–0.07)
Basophils Absolute: 0 10*3/uL (ref 0.0–0.1)
Basophils Relative: 0 %
Eosinophils Absolute: 0.1 10*3/uL (ref 0.0–0.5)
Eosinophils Relative: 1 %
HCT: 48.7 % — ABNORMAL HIGH (ref 36.0–46.0)
Hemoglobin: 16.1 g/dL — ABNORMAL HIGH (ref 12.0–15.0)
Immature Granulocytes: 0 %
Lymphocytes Relative: 23 %
Lymphs Abs: 1.4 10*3/uL (ref 0.7–4.0)
MCH: 34.3 pg — ABNORMAL HIGH (ref 26.0–34.0)
MCHC: 33.1 g/dL (ref 30.0–36.0)
MCV: 103.8 fL — ABNORMAL HIGH (ref 80.0–100.0)
Monocytes Absolute: 0.5 10*3/uL (ref 0.1–1.0)
Monocytes Relative: 7 %
Neutro Abs: 4.1 10*3/uL (ref 1.7–7.7)
Neutrophils Relative %: 69 %
Platelets: 153 10*3/uL (ref 150–400)
RBC: 4.69 MIL/uL (ref 3.87–5.11)
RDW: 14.2 % (ref 11.5–15.5)
WBC: 6.1 10*3/uL (ref 4.0–10.5)
nRBC: 0 % (ref 0.0–0.2)

## 2021-07-15 LAB — URINALYSIS, ROUTINE W REFLEX MICROSCOPIC
Bilirubin Urine: NEGATIVE
Glucose, UA: NEGATIVE mg/dL
Ketones, ur: NEGATIVE mg/dL
Nitrite: POSITIVE — AB
Protein, ur: NEGATIVE mg/dL
Specific Gravity, Urine: 1.012 (ref 1.005–1.030)
pH: 5 (ref 5.0–8.0)

## 2021-07-15 LAB — RESP PANEL BY RT-PCR (FLU A&B, COVID) ARPGX2
Influenza A by PCR: NEGATIVE
Influenza B by PCR: NEGATIVE
SARS Coronavirus 2 by RT PCR: NEGATIVE

## 2021-07-15 LAB — DIGOXIN LEVEL: Digoxin Level: 0.8 ng/mL (ref 0.8–2.0)

## 2021-07-15 LAB — TROPONIN I (HIGH SENSITIVITY)
Troponin I (High Sensitivity): 22 ng/L — ABNORMAL HIGH (ref <18)
Troponin I (High Sensitivity): 21 ng/L — ABNORMAL HIGH (ref <18)

## 2021-07-15 LAB — TSH: TSH: 3.6 u[IU]/mL (ref 0.350–4.500)

## 2021-07-15 MED ORDER — GABAPENTIN 100 MG PO CAPS
100.0000 mg | ORAL_CAPSULE | Freq: Three times a day (TID) | ORAL | Status: DC
  Administered 2021-07-15 – 2021-07-16 (×4): 100 mg via ORAL
  Filled 2021-07-15 (×4): qty 1

## 2021-07-15 MED ORDER — VITAMIN D 25 MCG (1000 UNIT) PO TABS
1000.0000 [IU] | ORAL_TABLET | Freq: Every day | ORAL | Status: DC
  Administered 2021-07-15 – 2021-07-16 (×2): 1000 [IU] via ORAL
  Filled 2021-07-15 (×2): qty 1

## 2021-07-15 MED ORDER — DIGOXIN 125 MCG PO TABS
125.0000 ug | ORAL_TABLET | Freq: Every day | ORAL | Status: DC
  Administered 2021-07-15 – 2021-07-16 (×2): 125 ug via ORAL
  Filled 2021-07-15 (×2): qty 1

## 2021-07-15 MED ORDER — CITALOPRAM HYDROBROMIDE 20 MG PO TABS
20.0000 mg | ORAL_TABLET | Freq: Every day | ORAL | Status: DC
Start: 2021-07-15 — End: 2021-07-16
  Administered 2021-07-15 – 2021-07-16 (×2): 20 mg via ORAL
  Filled 2021-07-15 (×2): qty 1

## 2021-07-15 MED ORDER — SODIUM CHLORIDE 0.9 % IV SOLN: 250.0000 mL | INTRAVENOUS | Status: DC | PRN

## 2021-07-15 MED ORDER — FUROSEMIDE 40 MG PO TABS
80.0000 mg | ORAL_TABLET | Freq: Every day | ORAL | Status: DC
  Administered 2021-07-15 – 2021-07-16 (×2): 80 mg via ORAL
  Filled 2021-07-15 (×2): qty 2

## 2021-07-15 MED ORDER — ALBUTEROL SULFATE (2.5 MG/3ML) 0.083% IN NEBU: 3.0000 mL | INHALATION_SOLUTION | Freq: Four times a day (QID) | RESPIRATORY_TRACT | Status: DC | PRN

## 2021-07-15 MED ORDER — POTASSIUM CHLORIDE ER 10 MEQ PO TBCR: 10.0000 meq | EXTENDED_RELEASE_TABLET | Freq: Every day | ORAL | Status: DC

## 2021-07-15 MED ORDER — SODIUM CHLORIDE 0.9 % IV SOLN
1.0000 g | Freq: Two times a day (BID) | INTRAVENOUS | Status: DC
  Administered 2021-07-15 (×2): 1 g via INTRAVENOUS
  Filled 2021-07-15 (×4): qty 1

## 2021-07-15 MED ORDER — MORPHINE SULFATE 15 MG PO TABS: 7.5000 mg | ORAL_TABLET | Freq: Four times a day (QID) | ORAL | Status: DC | PRN

## 2021-07-15 MED ORDER — ONDANSETRON HCL 4 MG/2ML IJ SOLN: 4.0000 mg | Freq: Four times a day (QID) | INTRAMUSCULAR | Status: DC | PRN

## 2021-07-15 MED ORDER — TIOTROPIUM BROMIDE MONOHYDRATE 18 MCG IN CAPS
18.0000 ug | ORAL_CAPSULE | Freq: Every day | RESPIRATORY_TRACT | Status: DC
  Administered 2021-07-16: 13:00:00 18 ug via RESPIRATORY_TRACT
  Filled 2021-07-15: qty 5

## 2021-07-15 MED ORDER — APIXABAN 5 MG PO TABS
5.0000 mg | ORAL_TABLET | Freq: Two times a day (BID) | ORAL | Status: DC
  Administered 2021-07-15 – 2021-07-16 (×3): 5 mg via ORAL
  Filled 2021-07-15 (×3): qty 1

## 2021-07-15 MED ORDER — DILTIAZEM HCL 25 MG/5ML IV SOLN
10.0000 mg | Freq: Once | INTRAVENOUS | Status: AC
  Administered 2021-07-15: 10 mg via INTRAVENOUS
  Filled 2021-07-15: qty 5

## 2021-07-15 MED ORDER — ACETAMINOPHEN 500 MG PO TABS
1000.0000 mg | ORAL_TABLET | Freq: Four times a day (QID) | ORAL | Status: DC | PRN
  Administered 2021-07-15 – 2021-07-16 (×2): 1000 mg via ORAL
  Filled 2021-07-15 (×2): qty 2

## 2021-07-15 MED ORDER — ONDANSETRON HCL 4 MG PO TABS: 4.0000 mg | ORAL_TABLET | Freq: Four times a day (QID) | ORAL | Status: DC | PRN

## 2021-07-15 MED ORDER — MEGESTROL ACETATE 20 MG PO TABS
40.0000 mg | ORAL_TABLET | Freq: Two times a day (BID) | ORAL | Status: DC
  Administered 2021-07-15 – 2021-07-16 (×2): 40 mg via ORAL
  Filled 2021-07-15 (×3): qty 2

## 2021-07-15 MED ORDER — METOPROLOL TARTRATE 25 MG PO TABS
12.5000 mg | ORAL_TABLET | Freq: Two times a day (BID) | ORAL | Status: DC
  Administered 2021-07-15 – 2021-07-16 (×3): 12.5 mg via ORAL
  Filled 2021-07-15 (×3): qty 1

## 2021-07-15 MED ORDER — MELATONIN 5 MG PO TABS
5.0000 mg | ORAL_TABLET | Freq: Every day | ORAL | Status: DC
Start: 2021-07-15 — End: 2021-07-16
  Administered 2021-07-15: 5 mg via ORAL
  Filled 2021-07-15 (×2): qty 1

## 2021-07-15 MED ORDER — FENTANYL CITRATE PF 50 MCG/ML IJ SOSY
25.0000 ug | PREFILLED_SYRINGE | Freq: Once | INTRAMUSCULAR | Status: AC
  Administered 2021-07-15: 25 ug via INTRAVENOUS
  Filled 2021-07-15: qty 1

## 2021-07-15 MED ORDER — POTASSIUM CHLORIDE CRYS ER 10 MEQ PO TBCR
10.0000 meq | EXTENDED_RELEASE_TABLET | Freq: Every day | ORAL | Status: DC
  Filled 2021-07-15: qty 1

## 2021-07-15 MED ORDER — SODIUM CHLORIDE 0.9% FLUSH
3.0000 mL | Freq: Two times a day (BID) | INTRAVENOUS | Status: DC
  Administered 2021-07-15 – 2021-07-16 (×3): 3 mL via INTRAVENOUS

## 2021-07-15 MED ORDER — LEVOTHYROXINE SODIUM 88 MCG PO TABS
88.0000 ug | ORAL_TABLET | Freq: Every day | ORAL | Status: DC
  Administered 2021-07-16: 07:00:00 88 ug via ORAL
  Filled 2021-07-15 (×2): qty 1

## 2021-07-15 MED ORDER — SODIUM CHLORIDE 0.9 % IV SOLN: 1.0000 g | INTRAVENOUS | Status: DC

## 2021-07-15 MED ORDER — SODIUM CHLORIDE 0.9% FLUSH
3.0000 mL | Freq: Two times a day (BID) | INTRAVENOUS | Status: DC
  Administered 2021-07-15 – 2021-07-16 (×2): 3 mL via INTRAVENOUS

## 2021-07-15 MED ORDER — SODIUM CHLORIDE 0.9% FLUSH: 3.0000 mL | INTRAVENOUS | Status: DC | PRN

## 2021-07-15 MED ORDER — PANTOPRAZOLE SODIUM 40 MG PO TBEC
40.0000 mg | DELAYED_RELEASE_TABLET | Freq: Every day | ORAL | Status: DC
Start: 2021-07-15 — End: 2021-07-16
  Administered 2021-07-15: 40 mg via ORAL
  Filled 2021-07-15: qty 1

## 2021-07-15 MED ORDER — VITAMIN B-12 100 MCG PO TABS
100.0000 ug | ORAL_TABLET | Freq: Every day | ORAL | Status: DC
  Administered 2021-07-15 – 2021-07-16 (×2): 100 ug via ORAL
  Filled 2021-07-15 (×2): qty 1

## 2021-07-15 MED ORDER — PANTOPRAZOLE SODIUM 40 MG PO TBEC: 40.0000 mg | DELAYED_RELEASE_TABLET | Freq: Every day | ORAL | Status: DC

## 2021-07-15 NOTE — H&P (Signed)
History and Physical    [name] GEX:[id number] DOB: 17-Jul-1940 DOA: 07/15/2021  PCP: Burnard Bunting, MD   Patient coming from: [name]  I have personally briefly reviewed patient's old medical records in [place]  Chief Complaint: Fall  HPI: [name] is a 81 y.o. female with medical history significant for COPD with chronic respiratory failure on 4 L of oxygen, history of A. fib on anticoagulation therapy, history of chronic low back pain, anxiety and depression, history of CHF presents to the ER via EMS for evaluation of multiple falls over the last 24 hours. Per patient she fell out of bed and is not sure what happened but denies hitting her head or loss of consciousness. She complains of pain in her lower back which is chronic but worse today as well as pain in her left shoulder.  At baseline she ambulates with a Rollator. At baseline she is usually short of breath and this is unchanged.  She also complains of frequency of urination but denies having any dysuria, nocturia or hematuria.  She denies having any chest pain, no nausea, no vomiting, no abdominal pain, no changes in her bowel habits, no headache, no neck pain, no blurred vision, no focal deficit. Sodium 137, potassium 4.0, chloride 102, bicarb 26, glucose 153, BUN 17, creatinine 1.07, calcium 9.1, alkaline phosphatase 56, albumin 3.8, AST 27, ALT 18, total protein 7.5, troponin 22, white count 6.1, hemoglobin 16.1, hematocrit 48.7, MCV 103.8, RDW 14.2, platelet count 153 Respiratory viral panel is negative Urine analysis shows pyuria CT scan of the head without contrast shows no acute intracranial abnormalities.  Mild cerebral atrophy with extensive chronic microvascular ischemic changes throughout the cerebral white matter.  Chronic left maxillary sinusitis. Chest x-ray reviewed by me shows cardiomegaly.  No acute cardiopulmonary disease. Lumbar spine x-ray shows no acute  osseous abnormality in the lumbar spine.  Advanced chronic disc and endplate degeneration at L4-L5 and L5-S1. X-ray of the pelvis shows no acute fracture or dislocation Left shoulder x-ray shows no acute fracture or dislocation.  Advanced degenerative changes Twelve-lead EKG reviewed by me shows A. fib with a rapid ventricular rate.  LVH  ED Course: Patient is an 81 year old Caucasian female who resides in an assisted living facility and was sent to the ER for evaluation of multiple falls over the last 24 hours.   In the ER she was noted to be in A. fib with a rapid ventricular and received a dose of IV diltiazem with an initial improvement in her heart rate. She has pyuria and prior urine culture yielded ESBL E. Coli She will be admitted to the hospital for further evaluation   Review of Systems: As per HPI otherwise all other systems reviewed and negative.    Past Medical History:  Diagnosis Date   AAA (abdominal aortic aneurysm)    Anticoagulant long-term use    Failed on Coumadin. On Xarelto   Anxiety    Anxiety and depression    Atrial fib/flutter, transient June 2012   Chronic lower back pain    Chronic respiratory failure with hypoxia ([place]) 12/03/2014   COPD (chronic obstructive pulmonary disease) ([place])    DVT (deep venous thrombosis) ([place]) 2004   BLE   Esophageal dysmotility    Exertional dyspnea    Hiatal hernia    History of bronchitis    History of fibrocystic disease of breast    History of uterine fibroid    HTN (  hypertension)    Hyperlipidemia    Hypothyroidism    Normal nuclear stress test 2012   May 2012   OA (osteoarthritis)    "knees; left shoulder"   OSA (obstructive sleep apnea)    "haven't been using my CPAP lately" (01/21/12)   Ovarian mass    right benign   Ovarian mass    benign, right   PAD (peripheral artery disease) ([place])    Small cell carcinoma of lung ([place]) 2004   NON-SMALL CELL CARCINOMA OF THE LUNG, METASTATIC TO THE SUPRACLAVICULAR AND  MEDIASTINAL LYMPH NODES; in remission    Past Surgical History:  Procedure Laterality Date   ABDOMINAL AORTIC ANEURYSM REPAIR  ~ 2010   stent graft   BLADDER SURGERY  ~ 2003   sling   BREAST BIOPSY  1960's   both breast's - benign   CARDIOVASCULAR STRESS TEST  2012   No ischemia   CATARACT EXTRACTION W/ INTRAOCULAR LENS  IMPLANT, BILATERAL Bilateral ~ 2009   REPLACEMENT TOTAL KNEE Left ~ 2008   left   THYROIDECTOMY  ~ 1964     reports that she quit smoking about 20 years ago. Her smoking use included cigarettes. She has a 40.00 pack-year smoking history. She has never used smokeless tobacco. She reports current alcohol use. She reports that she does not use drugs.  No Known Allergies  Family History  Problem Relation Age of Onset   Heart disease Father    Heart attack Father    Diabetes type II Mother    Diabetes Mother    Hypertension Mother    Heart disease Brother        before age 60   Heart disease Sister        See's Dr. Acie Fredrickson   Breast cancer Neg Hx       Prior to Admission medications   Medication Sig Start Date End Date Taking? Authorizing Provider  acetaminophen (TYLENOL) 500 MG tablet Take 2 tablets (1,000 mg total) by mouth every 6 (six) hours as needed for fever, headache, mild pain or moderate pain (backpain). 05/09/21   Sidney Ace, MD  albuterol (VENTOLIN HFA) 108 (90 Base) MCG/ACT inhaler TAKE 2 PUFFS EVERY 6 HOURS AS NEEDED FORSHORTNESS OF BREATH/WHEEZING 09/10/20   Baird Lyons D, MD  apixaban (ELIQUIS) 5 MG TABS tablet Take 1 tablet (5 mg total) by mouth 2 (two) times daily. 06/27/20   Martinique, Peter M, MD  Cholecalciferol (VITAMIN D) 1000 UNITS capsule Take 1,000 Units by mouth daily.    [provider]  citalopram (CELEXA) 20 MG tablet Take 20 mg by mouth daily.    [provider]  digoxin (LANOXIN) 0.125 MG tablet Take 1 tablet (125 mcg total) by mouth daily. 12/21/20   [place], Peter M, MD  furosemide (LASIX) 40 MG tablet Take  2 tablets ( 80 mg ) daily 12/21/20   [place], Peter M, MD  gabapentin (NEURONTIN) 100 MG capsule Take 1 capsule by mouth 3 (three) times daily.    [provider]  levothyroxine (SYNTHROID, LEVOTHROID) 88 MCG tablet Take 88 mcg by mouth daily.    [provider]  megestrol (MEGACE) 40 MG tablet Take 40 mg by mouth 2 (two) times daily.    [provider]  melatonin 5 MG TABS Take 1 tablet (5 mg total) by mouth at bedtime. 06/25/21 07/25/21  Val Riles, MD  metolazone (ZAROXOLYN) 2.5 MG tablet Take 1 tablet (2.5 mg total) by mouth daily as needed (ONE  DAILY PRN WEIGHT GAIN). USE AS NEEDED FOR WEIGHT GAIN 09/06/19   Deberah Pelton, NP  metoprolol tartrate (LOPRESSOR) 25 MG tablet TAKE 1/2 TABLET TWICE DAILY Patient taking differently: Take 12.5 mg by mouth 2 (two) times daily. 11/08/20   [place], Peter M, MD  morphine (MSIR) 15 MG tablet Take 0.5 tablets (7.5 mg total) by mouth every 6 (six) hours as needed. 06/25/21   Val Riles, MD  mupirocin ointment (BACTROBAN) 2 % mupirocin 2 % topical ointment  APPLY TO WOUNDS ON SKIN ONCE DAILY AT BANDAGE CHANGES Patient not taking: Reported on 06/17/2021    [provider]  omeprazole (PRILOSEC) 40 MG capsule Take 40 mg by mouth daily.    [provider]  OXYGEN Inhale 4 L into the lungs daily. Patient states she wears 2L at night and 4L when up and moving    [provider]  pantoprazole (PROTONIX) 40 MG tablet Take 1 tablet (40 mg total) by mouth daily. 05/10/21 06/09/21  Sidney Ace, MD  potassium chloride (KLOR-CON) 10 MEQ tablet Take 1 tablet (10 mEq total) by mouth 2 (two) times daily. 12/21/20   [place], Peter M, MD  tiotropium (SPIRIVA HANDIHALER) 18 MCG inhalation capsule Place 1 capsule (18 mcg total) into inhaler and inhale daily. 09/10/20   Deneise Lever, MD  vitamin B-12 (CYANOCOBALAMIN) 100 MCG tablet Take 100 mcg by mouth daily.    [provider]    Physical Exam: Vitals:    07/15/21 0637 07/15/21 0700 07/15/21 0730 07/15/21 0820  BP: 126/85 114/84 (!) 120/95 114/74  Pulse: 89 (!) 107 (!) 115 (!) 130  Resp: (!) 22 (!) 24 20 (!) 27  Temp:      TempSrc:      SpO2: 95% 97% 97% 93%     Vitals:   07/15/21 0637 07/15/21 0700 07/15/21 0730 07/15/21 0820  BP: 126/85 114/84 (!) 120/95 114/74  Pulse: 89 (!) 107 (!) 115 (!) 130  Resp: (!) 22 (!) 24 20 (!) 27  Temp:      TempSrc:      SpO2: 95% 97% 97% 93%      Constitutional: Alert and oriented x 2 . Person and  place.  Not in any apparent distress HEENT:      Head: Normocephalic and atraumatic.         Eyes: PERLA, EOMI, Conjunctivae are normal. Sclera is non-icteric.       Mouth/Throat: Mucous membranes are moist.       Neck: Supple with no signs of meningismus. Cardiovascular: Irregularly irregular, tachycardic. No murmurs, gallops, or rubs. 2+ symmetrical distal pulses are present . No JVD. 1+ LE edema Respiratory: Tachypnea.bilateral air entry in both lung fields  Gastrointestinal: Soft, non tender, and non distended with positive bowel sounds.  Genitourinary: No CVA tenderness. Musculoskeletal: Nontender with normal range of motion in all extremities. No cyanosis, or erythema of extremities. Neurologic:  Face is symmetric. Moving all extremities. No gross focal neurologic deficits .  Generalized weakness Skin: Skin is warm, dry.  No rash or ulcers Psychiatric: Mood and affect are normal    Labs on Admission: I have personally reviewed following labs and imaging studies  CBC: Recent Labs  Lab 07/15/21 0555  WBC 6.1  NEUTROABS 4.1  HGB 16.1*  HCT 48.7*  MCV 103.8*  PLT 716   Basic Metabolic Panel: Recent Labs  Lab 07/15/21 0555  NA 137  K 4.0  CL 102  CO2 26  GLUCOSE 153*  BUN 17  CREATININE 1.07*  CALCIUM 9.1   GFR: CrCl cannot be calculated (Unknown ideal weight.). Liver Function Tests: Recent Labs  Lab 07/15/21 0555  AST 27  ALT 18  ALKPHOS 56  BILITOT 1.4*  PROT 7.5   ALBUMIN 3.8   No results for input(s): LIPASE, AMYLASE in the last 168 hours. No results for input(s): AMMONIA in the last 168 hours. Coagulation Profile: No results for input(s): INR, PROTIME in the last 168 hours. Cardiac Enzymes: No results for input(s): CKTOTAL, CKMB, CKMBINDEX, TROPONINI in the last 168 hours. BNP (last 3 results) No results for input(s): PROBNP in the last 8760 hours. HbA1C: No results for input(s): HGBA1C in the last 72 hours. CBG: No results for input(s): GLUCAP in the last 168 hours. Lipid Profile: No results for input(s): CHOL, HDL, LDLCALC, TRIG, CHOLHDL, LDLDIRECT in the last 72 hours. Thyroid Function Tests: No results for input(s): TSH, T4TOTAL, FREET4, T3FREE, THYROIDAB in the last 72 hours. Anemia Panel: No results for input(s): VITAMINB12, FOLATE, FERRITIN, TIBC, IRON, RETICCTPCT in the last 72 hours. Urine analysis:    Component Value Date/Time   COLORURINE YELLOW (A) 07/15/2021 0644   APPEARANCEUR HAZY (A) 07/15/2021 0644   LABSPEC 1.012 07/15/2021 0644   PHURINE 5.0 07/15/2021 0644   GLUCOSEU NEGATIVE 07/15/2021 0644   HGBUR MODERATE (A) 07/15/2021 0644   BILIRUBINUR NEGATIVE 07/15/2021 0644   KETONESUR NEGATIVE 07/15/2021 0644   PROTEINUR NEGATIVE 07/15/2021 0644   UROBILINOGEN 1.0 05/29/2009 2253   NITRITE POSITIVE (A) 07/15/2021 0644   LEUKOCYTESUR TRACE (A) 07/15/2021 0644    Radiological Exams on Admission: DG Chest 1 View  Result Date: 07/15/2021 CLINICAL DATA:  82 year old female with pain after fall. EXAM: CHEST  1 VIEW COMPARISON:  Portable chest 06/17/2021 and earlier. FINDINGS: Portable AP supine view at 0602 hours. Stable cardiomegaly and mediastinal contours. Visualized tracheal air column is within normal limits. Mildly improved lung volumes. Stable to improved bilateral increased pulmonary interstitial opacity with no overt edema. No pneumothorax, pleural effusion or consolidation identified on this supine view. Osteopenia.  Advanced degenerative changes at the left shoulder. No acute osseous abnormality identified. IMPRESSION: Chronic cardiomegaly. No acute cardiopulmonary abnormality or acute traumatic injury identified. Electronically Signed   By: Genevie Ann M.D.   On: 07/15/2021 07:23   DG Lumbar Spine Complete  Result Date: 07/15/2021 CLINICAL DATA:  81 year old female with pain after fall. EXAM: LUMBAR SPINE - COMPLETE 4+ VIEW COMPARISON:  Lumbar radiographs 03/26/2010. Lumbar spine CT 03/26/2010. FINDINGS: Bifurcated abdominal aortic endograft. Normal lumbar segmentation. Stable lumbar lordosis since 2011. Advanced chronic disc and endplate degeneration at L4-L5 and L5-S1. Grade 1 chronic anterolisthesis at the latter. Widespread lumbar facet hypertrophy. Mild levoconvex lumbar scoliosis. No acute osseous abnormality identified. Negative visible bowel gas. IMPRESSION: 1. No acute osseous abnormality identified in the lumbar spine. 2. Advanced chronic disc and endplate degeneration at L4-L5 and L5-S1 with grade 1 anterolisthesis at the latter. 3. Abdominal Aortic Endograft. Electronically Signed   By: Genevie Ann M.D.   On: 07/15/2021 07:22   DG Pelvis 1-2 Views  Result Date: 07/15/2021 CLINICAL DATA:  81 year old female with pain after fall. EXAM: PELVIS - 1-2 VIEW COMPARISON:  CT Abdomen and Pelvis 02/14/2020. FINDINGS: Partially visible bifurcated abdominal aortic endograft. Sequelae of lower abdominal hernia repair with mesh. Asymmetric right hip osteoarthritis. Femoral heads normally located. Grossly intact proximal femurs. No pelvis fracture identified. Negative visible bowel gas. IMPRESSION: No acute fracture or dislocation identified about the pelvis. If there  is lateralizing hip pain then dedicated hip series is recommended. Electronically Signed   By: Genevie Ann M.D.   On: 07/15/2021 07:31   CT Head Wo Contrast  Result Date: 07/15/2021 CLINICAL DATA:  81 year old female with history of minor head trauma from multiple  falls. EXAM: CT HEAD WITHOUT CONTRAST TECHNIQUE: Contiguous axial images were obtained from the base of the skull through the vertex without intravenous contrast. RADIATION DOSE REDUCTION: This exam was performed according to the departmental dose-optimization program which includes automated exposure control, adjustment of the mA and/or kV according to patient size and/or use of iterative reconstruction technique. COMPARISON:  Head CT 06/17/2021. FINDINGS: Brain: Mild cerebral atrophy. Patchy and confluent areas of decreased attenuation are noted throughout the deep and periventricular white matter of the cerebral hemispheres bilaterally, compatible with chronic microvascular ischemic disease. No evidence of acute infarction, hemorrhage, hydrocephalus, extra-axial collection or mass lesion/mass effect. Vascular: No hyperdense vessel or unexpected calcification. Skull: Normal. Negative for fracture or focal lesion. Sinuses/Orbits: Chronic mucoperiosteal thickening and high attenuation secretions filling the left maxillary sinus, similar to the prior study. Other: None. IMPRESSION: 1. No acute intracranial abnormalities. 2. Mild cerebral atrophy with extensive chronic microvascular ischemic changes throughout the cerebral white matter, as above. 3. Chronic left maxillary sinusitis again noted. Electronically Signed   By: Vinnie Langton M.D.   On: 07/15/2021 06:26   DG Shoulder Left  Result Date: 07/15/2021 CLINICAL DATA:  81 year old female with pain after fall. EXAM: LEFT SHOULDER - 2+ VIEW COMPARISON:  Chest radiographs 03/07/2021 and earlier. FINDINGS: Chronic severe glenohumeral joint space loss with bulky osteophytosis. Chronic superior subluxation of the humeral head in keeping with chronic rotator cuff deficiency. Proximal left humerus intact. No acute fracture or dislocation identified. Visible left ribs appear intact. IMPRESSION: Advanced degenerative changes. No acute fracture or dislocation  identified . Electronically Signed   By: Genevie Ann M.D.   On: 07/15/2021 07:29     Assessment/Plan Principal Problem:   Falls Active Problems:   COPD (chronic obstructive pulmonary disease) (H[facility])   HTN (hypertension)   Atrial fibrillation with RVR ([facility])   Chronic respiratory failure with hypoxia ([facility])   Chronic systolic CHF (congestive heart failure) ([facility])   Acute lower UTI    Patient is an 81 year old female who presents to the ER for evaluation of multiple falls    Multiple falls Unclear etiology Patient is significantly deconditioned from multiple medical problems Will place on fall precautions We will request PT consult     UTI Patient noted to have pyuria and urine culture from 04/2021 yields 20,000 CFU ESBL E. Coli Will treat empirically with Meropenem and de-escalate once urine culture results.    A. fib with rapid ventricular rate Patient received 1 dose of IV diltiazem with initial improvement in her heart rate Continue metoprolol and digoxin May need IV diltiazem if rate remains uncontrolled Continue apixaban as primary prophylaxis for an acute stroke    Chronic systolic heart failure Last known LVEF 35 to 40% Continue furosemide and metoprol Patient not on an ACE inhibitor due to relative hypotension     COPD with chronic respiratory failure Continue oxygen supplementation at 4 L Continue as needed bronchodilator therapy as well as inhaled steroids    Hypothyroidism Stable Continue Synthroid    Depression Continue citalopram    DVT prophylaxis: Apixaban Code Status: full code  Family Communication: Greater than 50% of time was spent discussing patient's condition and plan of care with her in detail.  All questions and concerns have been addressed.  She verbalizes understanding and agrees with the plan.  CODE STATUS was discussed and she is a full code Disposition Plan: Back to previous home environment Consults called: Physical  therapy Status: Observation    Craven Crean MD [facility]     07/15/2021, 8:45 AM

## 2021-07-15 NOTE — ED Notes (Signed)
Pure wick placed. Assisted with eating, no changes.

## 2021-07-15 NOTE — Consult Note (Signed)
Pharmacy Antibiotic Note  [name] is a 81 y.o. female admitted on 07/15/2021 with UTI.  Pharmacy has been consulted for meropenem dosing.  Plan: Meropenem 1gm IV every 12 hours  Weight: 87.7 kg (193 lb 5.5 oz)  Temp (24hrs), Avg:98.1 F (36.7 C), Min:98.1 F (36.7 C), Max:98.1 F (36.7 C)  Recent Labs  Lab 07/15/21 0555  WBC 6.1  CREATININE 1.07*    Estimated Creatinine Clearance: 45.2 mL/min (A) (by C-G formula based on SCr of 1.07 mg/dL (H)).    No Known Allergies  Antimicrobials this admission: 1/23 Meropenem >>   Microbiology results:  BCx:   UCx:     Thank you for allowing pharmacy to be a part of this patients care.  Darrick Penna 07/15/2021 9:09 AM

## 2021-07-15 NOTE — ED Provider Notes (Signed)
[facility] Provider Note    Event Date/Time   First MD Initiated Contact with Patient 07/15/21 [phone]     (approximate)   History   Recurrent falls   HPI  [name] is a 81 y.o. female brought to the ED via EMS from memory care facility status post 3-4 falls over the past 24 hours.  Patient reports 3 of the falls were rolling off her bed.  Denies striking head or LOC.  Patient is on Eliquis.  History of COPD on 4 L continuous oxygen, DVT, atrial fibrillation, PAD.  Complains of acute on chronic left shoulder pain, and low back pain.  States she usually uses a Rollator to assist her with walking.  Denies headache, vision changes, neck pain, chest pain, shortness of breath, abdominal pain, nausea, vomiting or dizziness.     Past Medical History   Past Medical History:  Diagnosis Date   AAA (abdominal aortic aneurysm)    Anticoagulant long-term use    Failed on Coumadin. On Xarelto   Anxiety    Anxiety and depression    Atrial fib/flutter, transient June 2012   Chronic lower back pain    COPD (chronic obstructive pulmonary disease) ([place])    DVT (deep venous thrombosis) ([place]) 2004   BLE   Esophageal dysmotility    Exertional dyspnea    Hiatal hernia    History of bronchitis    History of fibrocystic disease of breast    History of uterine fibroid    HTN (hypertension)    Hyperlipidemia    Hypothyroidism    Normal nuclear stress test 2012   May 2012   OA (osteoarthritis)    "knees; left shoulder"   OSA (obstructive sleep apnea)    "haven't been using my CPAP lately" (01/21/12)   Ovarian mass    right benign   Ovarian mass    benign, right   PAD (peripheral artery disease) ([place])    Small cell carcinoma of lung ([place]) 2004   NON-SMALL CELL CARCINOMA OF THE LUNG, METASTATIC TO THE SUPRACLAVICULAR AND MEDIASTINAL LYMPH NODES; in remission     Active Problem List   Patient Active Problem List   Diagnosis Date Noted   Falls 07/15/2021    AMS (altered mental status) 06/17/2021   Acute on chronic respiratory failure with hypoxia ([place]) 24/26/8341   Acute metabolic encephalopathy 96/22/2979   UTI (urinary tract infection)    Severe sepsis (HCC)    Anticoagulant long-term use    Physical deconditioning 06/16/2019   Hypokalemia    Chronic combined systolic and diastolic CHF (congestive heart failure) ([place]) 09/15/2016   Hyperlipidemia    Chronic respiratory failure with hypoxia ([place]) 12/03/2014   Insomnia, chronic 01/27/2014   Atrial fibrillation with RVR ([place])    Multiple thyroid nodules 10/17/2012   AAA (abdominal aortic aneurysm) 05/11/2012   Bradycardia 01/23/2012   Hypothyroid 01/21/2012   HTN (hypertension) 01/21/2012   Edema 11/19/2011   Atrial flutter ([place]) 11/20/2010   Obstructive sleep apnea 10/16/2008   DVT 08/19/2008   COPD (chronic obstructive pulmonary disease) ([place]) 08/19/2008   NEOPLASM, MALIGNANT, LUNG, HX OF 89/[birth year]/1941   Diastolic heart failure, NYHA class 2 ([place]) 08/03/2008   Small cell carcinoma of lung ([place]) 06/23/2002   Hyperbilirubinemia 2004     Past Surgical History   Past Surgical History:  Procedure Laterality Date   ABDOMINAL AORTIC ANEURYSM REPAIR  ~ 2010   stent graft   BLADDER SURGERY  ~ 2003  sling   BREAST BIOPSY  1960's   both breast's - benign   CARDIOVASCULAR STRESS TEST  2012   No ischemia   CATARACT EXTRACTION W/ INTRAOCULAR LENS  IMPLANT, BILATERAL Bilateral ~ 2009   REPLACEMENT TOTAL KNEE Left ~ 2008   left   THYROIDECTOMY  ~ [place] Medications   Prior to Admission medications   Medication Sig Start Date End Date Taking? Authorizing Provider  acetaminophen (TYLENOL) 500 MG tablet Take 2 tablets (1,000 mg total) by mouth every 6 (six) hours as needed for fever, headache, mild pain or moderate pain (backpain). 05/09/21   Sidney Ace, MD  albuterol (VENTOLIN HFA) 108 (90 Base) MCG/ACT inhaler TAKE 2 PUFFS EVERY 6 HOURS AS NEEDED FORSHORTNESS OF  BREATH/WHEEZING 09/10/20   Baird Lyons D, MD  apixaban (ELIQUIS) 5 MG TABS tablet Take 1 tablet (5 mg total) by mouth 2 (two) times daily. 06/27/20   [place], Peter M, MD  Cholecalciferol (VITAMIN D) 1000 UNITS capsule Take 1,000 Units by mouth daily.    [provider]  citalopram (CELEXA) 20 MG tablet Take 20 mg by mouth daily.    [provider]  digoxin (LANOXIN) 0.125 MG tablet Take 1 tablet (125 mcg total) by mouth daily. 12/21/20   [place], Peter M, MD  furosemide (LASIX) 40 MG tablet Take 2 tablets ( 80 mg ) daily 12/21/20   [place], Peter M, MD  gabapentin (NEURONTIN) 100 MG capsule Take 1 capsule by mouth 3 (three) times daily.    [provider]  levothyroxine (SYNTHROID, LEVOTHROID) 88 MCG tablet Take 88 mcg by mouth daily.    [provider]  megestrol (MEGACE) 40 MG tablet Take 40 mg by mouth 2 (two) times daily.    [provider]  melatonin 5 MG TABS Take 1 tablet (5 mg total) by mouth at bedtime. 06/25/21 07/25/21  Val Riles, MD  metolazone (ZAROXOLYN) 2.5 MG tablet Take 1 tablet (2.5 mg total) by mouth daily as needed (ONE DAILY PRN WEIGHT GAIN). USE AS NEEDED FOR WEIGHT GAIN 09/06/19   Deberah Pelton, NP  metoprolol tartrate (LOPRESSOR) 25 MG tablet TAKE 1/2 TABLET TWICE DAILY Patient taking differently: Take 12.5 mg by mouth 2 (two) times daily. 11/08/20   [place], Peter M, MD  morphine (MSIR) 15 MG tablet Take 0.5 tablets (7.5 mg total) by mouth every 6 (six) hours as needed. 06/25/21   Val Riles, MD  mupirocin ointment (BACTROBAN) 2 % mupirocin 2 % topical ointment  APPLY TO WOUNDS ON SKIN ONCE DAILY AT BANDAGE CHANGES Patient not taking: Reported on 06/17/2021    [provider]  omeprazole (PRILOSEC) 40 MG capsule Take 40 mg by mouth daily.    [provider]  OXYGEN Inhale 4 L into the lungs daily. Patient states she wears 2L at night and 4L when up and moving    [provider]  pantoprazole (PROTONIX) 40  MG tablet Take 1 tablet (40 mg total) by mouth daily. 05/10/21 06/09/21  Sidney Ace, MD  potassium chloride (KLOR-CON) 10 MEQ tablet Take 1 tablet (10 mEq total) by mouth 2 (two) times daily. 12/21/20   [place], Peter M, MD  tiotropium (SPIRIVA HANDIHALER) 18 MCG inhalation capsule Place 1 capsule (18 mcg total) into inhaler and inhale daily. 09/10/20   Deneise Lever, MD  vitamin B-12 (CYANOCOBALAMIN) 100 MCG tablet Take 100 mcg by mouth daily.    [provider]     Allergies  Patient has no known allergies.   Family History   Family History  Problem Relation Age of Onset   Heart disease Father    Heart attack Father    Diabetes type II Mother    Diabetes Mother    Hypertension Mother    Heart disease Brother        before age 62   Heart disease Sister        See's Dr. Acie Fredrickson   Breast cancer Neg Hx      Physical Exam  Triage Vital Signs: ED Triage Vitals  Enc Vitals Group     BP      Pulse      Resp      Temp      Temp src      SpO2      Weight      Height      Head Circumference      Peak Flow      Pain Score      Pain Loc      Pain Edu?      Excl. in [place]?     Updated Vital Signs: BP 114/84    Pulse (!) 123    Temp 98.1 F (36.7 C) (Oral)    Resp (!) 27    SpO2 97%    General: Awake, no distress.  Eating peanut butter crackers. CV:  Irregular and tachycardic rhythm.  Good peripheral perfusion.  Resp:  Increased effort.  No wheezing, rales or rhonchi. Abd:  Nontender.  No distention.  Other:  Head and face atraumatic.  Cervical spine not tender to palpation.  Lumbar spine tender to palpation.  Left shoulder limited range of motion secondary to pain.  No obvious deformity noted.  Pelvis is stable.  Full range of motion hips without pain.  BLE 2+ nonpitting edema.   ED Results / Procedures / Treatments  Labs (all labs ordered are listed, but only abnormal results are displayed) Labs Reviewed  CBC WITH DIFFERENTIAL/PLATELET - Abnormal;  Notable for the following components:      Result Value   Hemoglobin 16.1 (*)    HCT 48.7 (*)    MCV 103.8 (*)    MCH 34.3 (*)    All other components within normal limits  COMPREHENSIVE METABOLIC PANEL - Abnormal; Notable for the following components:   Glucose, Bld 153 (*)    Creatinine, Ser 1.07 (*)    Total Bilirubin 1.4 (*)    GFR, Estimated 52 (*)    All other components within normal limits  TROPONIN I (HIGH SENSITIVITY) - Abnormal; Notable for the following components:   Troponin I (High Sensitivity) 22 (*)    All other components within normal limits  RESP PANEL BY RT-PCR (FLU A&B, COVID) ARPGX2  DIGOXIN LEVEL  URINALYSIS, ROUTINE W REFLEX MICROSCOPIC     EKG  ED ECG REPORT I, Kalasia Crafton J, the attending physician, personally viewed and interpreted this ECG.   Date: 07/15/2021  EKG Time: 0548  Rate: 127  Rhythm: atrial fibrillation, rate 127  Axis: LAD  Intervals:left anterior fascicular block  ST&T Change: Nonspecific    RADIOLOGY I have personally reviewed patient's CT head, chest x-ray, left shoulder x-ray, pelvis x-ray, lumbar spine x-ray as well as the radiology interpretation:  CT head: No ICH  Chest x-ray: pending  Left shoulder x-ray: pending  Pelvis x-ray: pending  Lumbar spine x-ray: pending  Official radiology report(s): CT Head Wo Contrast  Result Date: 07/15/2021 CLINICAL  DATA:  81 year old female with history of minor head trauma from multiple falls. EXAM: CT HEAD WITHOUT CONTRAST TECHNIQUE: Contiguous axial images were obtained from the base of the skull through the vertex without intravenous contrast. RADIATION DOSE REDUCTION: This exam was performed according to the departmental dose-optimization program which includes automated exposure control, adjustment of the mA and/or kV according to patient size and/or use of iterative reconstruction technique. COMPARISON:  Head CT 06/17/2021. FINDINGS: Brain: Mild cerebral atrophy. Patchy and  confluent areas of decreased attenuation are noted throughout the deep and periventricular white matter of the cerebral hemispheres bilaterally, compatible with chronic microvascular ischemic disease. No evidence of acute infarction, hemorrhage, hydrocephalus, extra-axial collection or mass lesion/mass effect. Vascular: No hyperdense vessel or unexpected calcification. Skull: Normal. Negative for fracture or focal lesion. Sinuses/Orbits: Chronic mucoperiosteal thickening and high attenuation secretions filling the left maxillary sinus, similar to the prior study. Other: None. IMPRESSION: 1. No acute intracranial abnormalities. 2. Mild cerebral atrophy with extensive chronic microvascular ischemic changes throughout the cerebral white matter, as above. 3. Chronic left maxillary sinusitis again noted. Electronically Signed   By: Vinnie Langton M.D.   On: 07/15/2021 06:26     PROCEDURES:  Critical Care performed: No  .1-3 Lead EKG Interpretation Performed by: Paulette Blanch, MD Authorized by: Paulette Blanch, MD     Interpretation: abnormal     ECG rate:  131   ECG rate assessment: tachycardic     Rhythm: atrial fibrillation     Ectopy: none     Conduction: normal   Comments:     Patient placed on cardiac monitor to evaluate for arrhythmias   MEDICATIONS ORDERED IN ED: Medications  fentaNYL (SUBLIMAZE) injection 25 mcg (25 mcg Intravenous Given 07/15/21 0635)  diltiazem (CARDIZEM) injection 10 mg (10 mg Intravenous Given 07/15/21 8032)     IMPRESSION / MDM / ASSESSMENT AND PLAN / ED COURSE  I reviewed the triage vital signs and the nursing notes.                             81 year old female presenting with recurrent falls in the past 24 hours.  Differential diagnosis includes but is not limited to CVA, ICH, metabolic, infectious etiologies, etc.  I have personally reviewed patient's MAR and note that she is on Eliquis as well as digoxin.  I have noted a recent hospitalization  06/17/2021-06/25/2021 for altered mental status and dyspnea from acute on chronic respiratory failure secondary to rapid atrial fibrillation  The patient is on the cardiac monitor to evaluate for evidence of arrhythmia and/or significant heart rate changes.  Will obtain lab work, UA, CT head, x-rays of chest, left shoulder, pelvis and lumbar spine.  Administer low-dose IV fentanyl for pain.  Will reassess.  Clinical Course as of 07/15/21 0708  Mon Jul 15, 2021  0559 Patient noted to be in atrial fibrillation with RVR.  Will administer 10 mg IV Cardizem bolus and reassess. [JS]  1224 Heart rate 89 after IV Cardizem bolus.  Laboratory results notable for normal WBC, mild AKI compared to 2 weeks ago, normal digoxin level, mildly elevated troponin.  CT head negative for ICH.  Wet read plain film x-rays unremarkable.  Will consult hospitalist services for admission. [JS]    Clinical Course User Index [JS] Paulette Blanch, MD     FINAL CLINICAL IMPRESSION(S) / ED DIAGNOSES   Final diagnoses:  Pain  Fall, initial encounter  Acute  midline low back pain without sciatica  Atrial fibrillation with rapid ventricular response ([place])  Weakness generalized  Recurrent falls     Rx / DC Orders   ED Discharge Orders     None        Note:  This document was prepared using Dragon voice recognition software and may include unintentional dictation errors.   Paulette Blanch, MD 07/15/21 [phone]

## 2021-07-15 NOTE — ED Notes (Signed)
Repositioned onto R side per request, denies pain, alert, NAD, calm, interactive. Remains Afib on monitor, HR 104-130. VSS.

## 2021-07-15 NOTE — ED Notes (Signed)
Transport initiated, pending arrival.

## 2021-07-15 NOTE — ED Triage Notes (Signed)
Arrives via EMS from [place] for multiple falls. Patient alert, oriented to name and place. Reports low back pain. Denies hitting head, denies LOC.

## 2021-07-16 DIAGNOSIS — Z7401 Bed confinement status: Secondary | ICD-10-CM | POA: Diagnosis not present

## 2021-07-16 DIAGNOSIS — J9611 Chronic respiratory failure with hypoxia: Secondary | ICD-10-CM | POA: Diagnosis not present

## 2021-07-16 DIAGNOSIS — R296 Repeated falls: Secondary | ICD-10-CM | POA: Diagnosis not present

## 2021-07-16 DIAGNOSIS — R069 Unspecified abnormalities of breathing: Secondary | ICD-10-CM | POA: Diagnosis not present

## 2021-07-16 DIAGNOSIS — N390 Urinary tract infection, site not specified: Secondary | ICD-10-CM | POA: Diagnosis not present

## 2021-07-16 DIAGNOSIS — W19XXXA Unspecified fall, initial encounter: Secondary | ICD-10-CM | POA: Diagnosis not present

## 2021-07-16 LAB — CBC
HCT: 42.9 % (ref 36.0–46.0)
Hemoglobin: 14.3 g/dL (ref 12.0–15.0)
MCH: 34 pg (ref 26.0–34.0)
MCHC: 33.3 g/dL (ref 30.0–36.0)
MCV: 101.9 fL — ABNORMAL HIGH (ref 80.0–100.0)
Platelets: 129 10*3/uL — ABNORMAL LOW (ref 150–400)
RBC: 4.21 MIL/uL (ref 3.87–5.11)
RDW: 14.1 % (ref 11.5–15.5)
WBC: 4.9 10*3/uL (ref 4.0–10.5)
nRBC: 0 % (ref 0.0–0.2)

## 2021-07-16 LAB — BASIC METABOLIC PANEL
Anion gap: 7 (ref 5–15)
BUN: 13 mg/dL (ref 8–23)
CO2: 28 mmol/L (ref 22–32)
Calcium: 8.6 mg/dL — ABNORMAL LOW (ref 8.9–10.3)
Chloride: 102 mmol/L (ref 98–111)
Creatinine, Ser: 0.77 mg/dL (ref 0.44–1.00)
GFR, Estimated: >60 mL/min (ref >60)
Glucose, Bld: 107 mg/dL — ABNORMAL HIGH (ref 70–99)
Potassium: 3.6 mmol/L (ref 3.5–5.1)
Sodium: 137 mmol/L (ref 135–145)

## 2021-07-16 MED ORDER — ORAL CARE MOUTH RINSE
15.0000 mL | Freq: Two times a day (BID) | OROMUCOSAL | Status: DC
  Administered 2021-07-16: 08:00:00 15 mL via OROMUCOSAL

## 2021-07-16 MED ORDER — POTASSIUM CHLORIDE 20 MEQ/15ML (10%) PO SOLN
10.0000 meq | Freq: Every day | ORAL | Status: DC
  Filled 2021-07-16: qty 15

## 2021-07-16 NOTE — NC FL2 (Signed)
[place] LEVEL OF CARE SCREENING TOOL     IDENTIFICATION  Patient Name: [name] Birthdate: [birth year] Sex: female Admission Date (Current Location): 07/15/2021  [facility] and [place] Number:  Engineering geologist and Address:  [facility], [address] [place], [place] [address]      Provider Number: [id number]  Attending Physician Name and Address:  Enzo Bi, MD  Relative Name and Phone Number:  Justyce, Yeater (Daughter)   [phone] [facility])    Current Level of Care: Hospital Recommended Level of Care:  ([place]) Prior Approval Number:    Date Approved/Denied:   PASRR Number: [id number]  Discharge Plan: Other (Comment) ([place], [place])    Current Diagnoses: Patient Active Problem List   Diagnosis Date Noted   Falls 07/15/2021   AMS (altered mental status) 06/17/2021   Acute on chronic respiratory failure with hypoxia ([place]) 27/25/3664   Acute metabolic encephalopathy 40/34/7425   Acute lower UTI    Severe sepsis (HCC)    Anticoagulant long-term use    Physical deconditioning 06/16/2019   Hypokalemia    Chronic systolic CHF (congestive heart failure) ([place]) 09/15/2016   Hyperlipidemia    Chronic respiratory failure with hypoxia ([place]) 12/03/2014   Insomnia, chronic 01/27/2014   Atrial fibrillation with RVR (HCC)    Multiple thyroid nodules 10/17/2012   AAA (abdominal aortic aneurysm) 05/11/2012   Bradycardia 01/23/2012   Hypothyroid 01/21/2012   HTN (hypertension) 01/21/2012   Edema 11/19/2011   Atrial flutter ([place]) 11/20/2010   Obstructive sleep apnea 10/16/2008   DVT 08/19/2008   COPD (chronic obstructive pulmonary disease) ([place]) 08/19/2008   NEOPLASM, MALIGNANT, LUNG, HX OF 95/63/8756   Diastolic heart failure, NYHA class 2 ([place]) 08/03/2008   Small cell carcinoma of lung ([place]) 06/23/2002   Hyperbilirubinemia 2004    Orientation RESPIRATION  BLADDER Height & Weight     Self, Time, Situation, Place  O2 (2L [place]) External catheter Weight: 87.7 kg Height:     BEHAVIORAL SYMPTOMS/MOOD NEUROLOGICAL BOWEL NUTRITION STATUS      Continent Diet (2g Sodium)  AMBULATORY STATUS COMMUNICATION OF NEEDS Skin   Supervision Verbally Bruising (Bilat legs and arms)                       Personal Care Assistance Level of Assistance  Bathing, Feeding, Dressing Bathing Assistance: Limited assistance (1 assist) Feeding assistance: Independent Dressing Assistance: Limited assistance     Functional Limitations Info    Sight Info: Adequate Hearing Info: Adequate      SPECIAL CARE FACTORS FREQUENCY                       Contractures Contractures Info: Present    Additional Factors Info  Code Status Code Status Info: Full Allergies Info: No Known Allergies     Isolation Precautions Info: ESBL     Current Medications (07/16/2021):  This is the current hospital active medication list Current Facility-Administered Medications  Medication Dose Route Frequency Provider Last Rate Last Admin   0.9 %  sodium chloride infusion  250 mL Intravenous PRN Agbata, Tochukwu, MD       acetaminophen (TYLENOL) tablet 1,000 mg  1,000 mg Oral Q6H PRN Agbata, Tochukwu, MD   1,000 mg at 07/16/21 0815   albuterol (PROVENTIL) (2.5 MG/3ML) 0.083% nebulizer solution 3 mL  3 mL Inhalation Q6H PRN Collier Bullock, MD  apixaban (ELIQUIS) tablet 5 mg  5 mg Oral BID Agbata, Tochukwu, MD   5 mg at 07/16/21 0815   cholecalciferol (VITAMIN D3) tablet 1,000 Units  1,000 Units Oral Daily Agbata, Tochukwu, MD   1,000 Units at 07/16/21 0816   citalopram (CELEXA) tablet 20 mg  20 mg Oral Daily Agbata, Tochukwu, MD   20 mg at 07/16/21 0815   digoxin (LANOXIN) tablet 125 mcg  125 mcg Oral Daily Agbata, Tochukwu, MD   125 mcg at 07/16/21 0815   furosemide (LASIX) tablet 80 mg  80 mg Oral Daily Agbata, Tochukwu, MD   80 mg at 07/16/21 0814   gabapentin  (NEURONTIN) capsule 100 mg  100 mg Oral TID Collier Bullock, MD   100 mg at 07/16/21 0815   levothyroxine (SYNTHROID) tablet 88 mcg  88 mcg Oral Daily Agbata, Tochukwu, MD   88 mcg at 07/16/21 0646   MEDLINE mouth rinse  15 mL Mouth Rinse BID [place]oust, Katy L, NP   15 mL at 07/16/21 0816   megestrol (MEGACE) tablet 40 mg  40 mg Oral BID Agbata, Tochukwu, MD   40 mg at 07/16/21 0815   melatonin tablet 5 mg  5 mg Oral QHS Agbata, Tochukwu, MD   5 mg at 07/15/21 2236   metoprolol tartrate (LOPRESSOR) tablet 12.5 mg  12.5 mg Oral BID Agbata, Tochukwu, MD   12.5 mg at 07/16/21 0814   ondansetron (ZOFRAN) tablet 4 mg  4 mg Oral Q6H PRN Agbata, Tochukwu, MD       Or   ondansetron (ZOFRAN) injection 4 mg  4 mg Intravenous Q6H PRN Agbata, Tochukwu, MD       pantoprazole (PROTONIX) EC tablet 40 mg  40 mg Oral QHS Agbata, Tochukwu, MD   40 mg at 07/15/21 2236   potassium chloride 20 MEQ/15ML (10%) solution 10 mEq  10 mEq Oral Daily Chinita Greenland A, RPH       sodium chloride flush (NS) 0.9 % injection 3 mL  3 mL Intravenous Q12H Agbata, Tochukwu, MD   3 mL at 07/16/21 0816   sodium chloride flush (NS) 0.9 % injection 3 mL  3 mL Intravenous Q12H Agbata, Tochukwu, MD   3 mL at 07/16/21 0816   sodium chloride flush (NS) 0.9 % injection 3 mL  3 mL Intravenous PRN Agbata, Tochukwu, MD       tiotropium (SPIRIVA) inhalation capsule ([facility] use ONLY) 18 mcg  18 mcg Inhalation Daily Agbata, Tochukwu, MD   18 mcg at 07/16/21 1237   vitamin B-12 (CYANOCOBALAMIN) tablet 100 mcg  100 mcg Oral Daily Agbata, Tochukwu, MD   100 mcg at 07/16/21 8563     Discharge Medications: Please see discharge summary for a list of discharge medications.  Relevant Imaging Results:  Relevant Lab Results:   Additional Information SSN [place] Fred Franzen, RN

## 2021-07-16 NOTE — Progress Notes (Signed)
Patient discharged via EMS back to [facility] in stable condition.

## 2021-07-16 NOTE — Evaluation (Addendum)
Physical Therapy Evaluation Patient Details Name: [name] MRN: [id number] DOB: 11-20-1940 Today's Date: 07/16/2021  History of Present Illness  Pt is an 81 y.o. female with medical history significant for COPD with chronic respiratory failure on 4 L of oxygen, history of A. fib on anticoagulation therapy, history of chronic low back pain, anxiety and depression, history of CHF presents to the ER via EMS for evaluation of multiple falls over the last 24 hours. MD assessment includes: multiple falls, UTI, and A-fib with rapid ventricular rate.   Clinical Impression  Pt was pleasant and motivated to participate during the session and put forth good effort throughout. Pt required no physical assistance during the session and was able to amb 20' with a RW including some backwards steps without LOB. Pt reported no adverse symptoms during the session while on 4LO2/min. Pt will benefit from HHPT upon discharge to safely address deficits listed in patient problem list for decreased caregiver assistance and eventual return to PLOF.         Recommendations for follow up therapy are one component of a multi-disciplinary discharge planning process, led by the attending physician.  Recommendations may be updated based on patient status, additional functional criteria and insurance authorization.  Follow Up Recommendations Home health PT (addendum)     Assistance Recommended at Discharge Intermittent Supervision/Assistance  Patient can return home with the following  A little help with walking and/or transfers;A little help with bathing/dressing/bathroom;Direct supervision/assist for medications management;Assist for transportation    Equipment Recommendations None recommended by PT  Recommendations for Other Services       Functional Status Assessment Patient has had a recent decline in their functional status and demonstrates the ability to make significant improvements in function in a  reasonable and predictable amount of time.     Precautions / Restrictions Precautions Precautions: Fall Restrictions Weight Bearing Restrictions: No      Mobility  Bed Mobility Overal bed mobility: Modified Independent             General bed mobility comments: Extra time and effort only    Transfers Overall transfer level: Needs assistance Equipment used: Rolling walker (2 wheels) Transfers: Sit to/from Stand Sit to Stand: Supervision           General transfer comment: Fair eccentric and concentric control with bed in standard position with min cues for hand placement    Ambulation/Gait Ambulation/Gait assistance: Min guard Gait Distance (Feet): 20 Feet Assistive device: Rolling walker (2 wheels) Gait Pattern/deviations: Step-through pattern Gait velocity: decreased     General Gait Details: Pt able to amb 20 feet before needing to sit secondary to fatigue with SpO2 and HR WNL on 4LO2/min  Stairs            Wheelchair Mobility    Modified Rankin (Stroke Patients Only)       Balance Overall balance assessment: Needs assistance Sitting-balance support: No upper extremity supported, Feet supported Sitting balance-Leahy Scale: Good     Standing balance support: Bilateral upper extremity supported, During functional activity Standing balance-Leahy Scale: Fair                               Pertinent Vitals/Pain Pain Assessment Pain Assessment: No/denies pain    Home Living Family/patient expects to be discharged to:: Assisted living                 Home Equipment: Conservation officer, nature (2  wheels);Toilet riser;Grab bars - tub/shower;Grab bars - toilet;Shower seat - built in;Rollator (4 wheels) Additional Comments: Pt lives at [facility]    Prior Function Prior Level of Function : Needs assist       Physical Assist : ADLs (physical)     Mobility Comments: Ind amb in her room without AD, Mod Ind amb with a rollator  facility distances, uses facility w/c for MD apts ADLs Comments: Assist from staff with bathing, meals, and meds; mostly Ind with dressing with occassional assist secondary to L shoulder limitations     Hand Dominance   Dominant Hand: Right    Extremity/Trunk Assessment   Upper Extremity Assessment Upper Extremity Assessment: LUE deficits/detail RUE Deficits / Details: Chronic L shoulder weakness and limited ROM    Lower Extremity Assessment Lower Extremity Assessment: Generalized weakness       Communication   Communication: No difficulties  Cognition Arousal/Alertness: Awake/alert Behavior During Therapy: WFL for tasks assessed/performed Overall Cognitive Status: Within Functional Limits for tasks assessed                                          General Comments      Exercises Total Joint Exercises Ankle Circles/Pumps: AROM, Strengthening, Both, 10 reps Quad Sets: Strengthening, Both, 10 reps Gluteal Sets: Strengthening, Both, 10 reps Hip ABduction/ADduction: AROM, Strengthening, Both, 5 reps Long Arc Quad: AROM, Strengthening, Both, 10 reps Marching in Standing: AROM, Strengthening, Both, 5 reps, Standing   Assessment/Plan    PT Assessment Patient needs continued PT services  PT Problem List Decreased strength;Decreased activity tolerance;Decreased balance;Decreased mobility;Decreased knowledge of use of DME       PT Treatment Interventions DME instruction;Gait training;Functional mobility training;Therapeutic activities;Therapeutic exercise;Balance training;Patient/family education    PT Goals (Current goals can be found in the Care Plan section)  Acute Rehab PT Goals Patient Stated Goal: To be more independent PT Goal Formulation: With patient Time For Goal Achievement: 07/29/21 Potential to Achieve Goals: Good    Frequency Min 2X/week     Co-evaluation               AM-PAC PT "6 Clicks" Mobility  Outcome Measure Help needed  turning from your back to your side while in a flat bed without using bedrails?: None Help needed moving from lying on your back to sitting on the side of a flat bed without using bedrails?: None Help needed moving to and from a bed to a chair (including a wheelchair)?: A Little Help needed standing up from a chair using your arms (e.g., wheelchair or bedside chair)?: A Little Help needed to walk in hospital room?: A Little Help needed climbing 3-5 steps with a railing? : A Little 6 Click Score: 20    End of Session Equipment Utilized During Treatment: Gait belt;Oxygen Activity Tolerance: Patient tolerated treatment well Patient left: with call bell/phone within reach;in chair;with chair alarm set Nurse Communication: Mobility status PT Visit Diagnosis: Muscle weakness (generalized) (M62.81);Difficulty in walking, not elsewhere classified (R26.2)    Time: 1001-1043 PT Time Calculation (min) (ACUTE ONLY): 42 min   Charges:   PT Evaluation $PT Eval Moderate Complexity: 1 Mod PT Treatments $Therapeutic Exercise: 8-22 mins        D. Royetta Asal PT, DPT 07/16/21, 2:13 PM

## 2021-07-16 NOTE — Discharge Summary (Signed)
Physician Discharge Summary   [name]  female DOB: 1940/11/16  UMP:[id number]  PCP: Burnard Bunting, MD  Admit date: 07/15/2021 Discharge date: 07/16/2021  Admitted From: ALF Disposition:  ALF Home Health: Yes CODE STATUS: Full code   Hospital Course:  For full details, please see H&P, progress notes, consult notes and ancillary notes.  Briefly,  [name] is a 81 y.o. female with medical history significant for COPD with chronic respiratory failure on 2-4 L of oxygen, history of A. fib on anticoagulation therapy, history of chronic low back pain, anxiety and depression, history of CHF presented to the ER via EMS for evaluation of multiple falls out of bed.  Multiple falls out of bed --Pt reported waking up on the ground next to her bed.  Did not fall while awake and walking.  Discussed ways to prevent injuries by placing matts next to the bed.  Pt mentioned getting bed rails, which may not be approved by facility.  Pt also mentioned getting a lower bed. --PT eval clear pt to return back to ALF with [facility] PT.   UTI, ruled out Asymptomatic bacteruria  --Pt was given Meropenem on admission.  Pt denied any urinary symptoms, no dysuria, no bladder pain, and was not septic.  Will not continue abx.     A. fib with rapid ventricular rate Patient received 1 dose of IV diltiazem with improvement in her heart rate --Continue home metoprolol and digoxin --Continue apixaban as primary prophylaxis    Chronic systolic heart failure Last known LVEF 35 to 40% Continue furosemide and metop Cont home potassium supplement. Patient not on an ACE inhibitor due to relative hypotension   COPD with chronic respiratory failure on 4L home O2 --cont home Spiriva   Hypothyroidism Continue Synthroid   Depression Continue citalopram    Discharge Diagnoses:  Principal Problem:   Falls Active Problems:   COPD (chronic obstructive pulmonary disease) (HCC)   HTN (hypertension)    Atrial fibrillation with RVR (HCC)   Chronic respiratory failure with hypoxia (HCC)   Chronic systolic CHF (congestive heart failure) (HCC)   Acute lower UTI   30 Day Unplanned Readmission Risk Score    Flowsheet Row ED to Hosp-Admission (Discharged) from 06/17/2021 in [facility]  30 Day Unplanned Readmission Risk Score (%) 27.43 Filed at 06/25/2021 2000       This score is the patient's risk of an unplanned readmission within 30 days of being discharged (0 -100%). The score is based on dignosis, age, lab data, medications, orders, and past utilization.   Low:  0-14.9   Medium: 15-21.9   High: 22-29.9   Extreme: 30 and above         Discharge Instructions:  Allergies as of 07/16/2021   No Known Allergies      Medication List     STOP taking these medications    metolazone 2.5 MG tablet Commonly known as: ZAROXOLYN   mupirocin ointment 2 % Commonly known as: BACTROBAN   omeprazole 40 MG capsule Commonly known as: PRILOSEC   potassium chloride 10 MEQ tablet Commonly known as: KLOR-CON       TAKE these medications    acetaminophen 500 MG tablet Commonly known as: TYLENOL Take 2 tablets (1,000 mg total) by mouth every 6 (six) hours as needed for fever, headache, mild pain or moderate pain (backpain).   albuterol 108 (90 Base) MCG/ACT inhaler Commonly known as: Ventolin HFA TAKE 2 PUFFS EVERY 6 HOURS  AS NEEDED FORSHORTNESS OF BREATH/WHEEZING   apixaban 5 MG Tabs tablet Commonly known as: ELIQUIS Take 1 tablet (5 mg total) by mouth 2 (two) times daily.   citalopram 20 MG tablet Commonly known as: CELEXA Take 20 mg by mouth daily.   digoxin 0.125 MG tablet Commonly known as: LANOXIN Take 1 tablet (125 mcg total) by mouth daily.   furosemide 40 MG tablet Commonly known as: LASIX Take 2 tablets ( 80 mg ) daily   gabapentin 100 MG capsule Commonly known as: NEURONTIN Take 1 capsule by mouth 3 (three) times daily.    levothyroxine 88 MCG tablet Commonly known as: SYNTHROID Take 88 mcg by mouth daily.   megestrol 40 MG tablet Commonly known as: MEGACE Take 40 mg by mouth 2 (two) times daily.   melatonin 5 MG Tabs Take 1 tablet (5 mg total) by mouth at bedtime.   metoprolol tartrate 25 MG tablet Commonly known as: LOPRESSOR TAKE 1/2 TABLET TWICE DAILY What changed:  how much to take how to take this when to take this additional instructions   morphine 15 MG tablet Commonly known as: MSIR Take 0.5 tablets (7.5 mg total) by mouth every 6 (six) hours as needed.   OXYGEN Inhale 4 L into the lungs daily. Patient states she wears 2L at night and 4L when up and moving   pantoprazole 40 MG tablet Commonly known as: PROTONIX Take 1 tablet (40 mg total) by mouth daily.   potassium chloride SA 20 MEQ tablet Commonly known as: KLOR-CON M Take 20 mEq by mouth 2 (two) times daily.   Spiriva HandiHaler 18 MCG inhalation capsule Generic drug: tiotropium Place 1 capsule (18 mcg total) into inhaler and inhale daily.   vitamin B-12 100 MCG tablet Commonly known as: CYANOCOBALAMIN Take 100 mcg by mouth daily.   Vitamin D 1000 units capsule Take 1,000 Units by mouth daily.         Follow-up Information     Burnard Bunting, MD Follow up in 1 week(s).   Specialty: Internal Medicine Contact information: [address] [place] [address] [phone]                 No Known Allergies   The results of significant diagnostics from this hospitalization (including imaging, microbiology, ancillary and laboratory) are listed below for reference.   Consultations:   Procedures/Studies: DG Chest 1 View  Result Date: 07/15/2021 CLINICAL DATA:  81 year old female with pain after fall. EXAM: CHEST  1 VIEW COMPARISON:  Portable chest 06/17/2021 and earlier. FINDINGS: Portable AP supine view at 0602 hours. Stable cardiomegaly and mediastinal contours. Visualized tracheal air column  is within normal limits. Mildly improved lung volumes. Stable to improved bilateral increased pulmonary interstitial opacity with no overt edema. No pneumothorax, pleural effusion or consolidation identified on this supine view. Osteopenia. Advanced degenerative changes at the left shoulder. No acute osseous abnormality identified. IMPRESSION: Chronic cardiomegaly. No acute cardiopulmonary abnormality or acute traumatic injury identified. Electronically Signed   By: Genevie Ann M.D.   On: 07/15/2021 07:23   DG Lumbar Spine Complete  Result Date: 07/15/2021 CLINICAL DATA:  81 year old female with pain after fall. EXAM: LUMBAR SPINE - COMPLETE 4+ VIEW COMPARISON:  Lumbar radiographs 03/26/2010. Lumbar spine CT 03/26/2010. FINDINGS: Bifurcated abdominal aortic endograft. Normal lumbar segmentation. Stable lumbar lordosis since 2011. Advanced chronic disc and endplate degeneration at L4-L5 and L5-S1. Grade 1 chronic anterolisthesis at the latter. Widespread lumbar facet hypertrophy. Mild levoconvex lumbar scoliosis. No acute osseous  abnormality identified. Negative visible bowel gas. IMPRESSION: 1. No acute osseous abnormality identified in the lumbar spine. 2. Advanced chronic disc and endplate degeneration at L4-L5 and L5-S1 with grade 1 anterolisthesis at the latter. 3. Abdominal Aortic Endograft. Electronically Signed   By: Genevie Ann M.D.   On: 07/15/2021 07:22   DG Pelvis 1-2 Views  Result Date: 07/15/2021 CLINICAL DATA:  81 year old female with pain after fall. EXAM: PELVIS - 1-2 VIEW COMPARISON:  CT Abdomen and Pelvis 02/14/2020. FINDINGS: Partially visible bifurcated abdominal aortic endograft. Sequelae of lower abdominal hernia repair with mesh. Asymmetric right hip osteoarthritis. Femoral heads normally located. Grossly intact proximal femurs. No pelvis fracture identified. Negative visible bowel gas. IMPRESSION: No acute fracture or dislocation identified about the pelvis. If there is lateralizing hip pain  then dedicated hip series is recommended. Electronically Signed   By: Genevie Ann M.D.   On: 07/15/2021 07:31   CT Head Wo Contrast  Result Date: 07/15/2021 CLINICAL DATA:  81 year old female with history of minor head trauma from multiple falls. EXAM: CT HEAD WITHOUT CONTRAST TECHNIQUE: Contiguous axial images were obtained from the base of the skull through the vertex without intravenous contrast. RADIATION DOSE REDUCTION: This exam was performed according to the departmental dose-optimization program which includes automated exposure control, adjustment of the mA and/or kV according to patient size and/or use of iterative reconstruction technique. COMPARISON:  Head CT 06/17/2021. FINDINGS: Brain: Mild cerebral atrophy. Patchy and confluent areas of decreased attenuation are noted throughout the deep and periventricular white matter of the cerebral hemispheres bilaterally, compatible with chronic microvascular ischemic disease. No evidence of acute infarction, hemorrhage, hydrocephalus, extra-axial collection or mass lesion/mass effect. Vascular: No hyperdense vessel or unexpected calcification. Skull: Normal. Negative for fracture or focal lesion. Sinuses/Orbits: Chronic mucoperiosteal thickening and high attenuation secretions filling the left maxillary sinus, similar to the prior study. Other: None. IMPRESSION: 1. No acute intracranial abnormalities. 2. Mild cerebral atrophy with extensive chronic microvascular ischemic changes throughout the cerebral white matter, as above. 3. Chronic left maxillary sinusitis again noted. Electronically Signed   By: Vinnie Langton M.D.   On: 07/15/2021 06:26   CT HEAD WO CONTRAST (5MM)  Result Date: 06/17/2021 CLINICAL DATA:  Head trauma unwitnessed fall EXAM: CT HEAD WITHOUT CONTRAST TECHNIQUE: Contiguous axial images were obtained from the base of the skull through the vertex without intravenous contrast. COMPARISON:  CT brain 07/05/2014 FINDINGS: Brain: No acute  territorial infarction, hemorrhage or intracranial mass. Extensive white matter hypodensity progressed compared to prior. Age indeterminate hypodensity in the right thalamus. Normal ventricle size. Mild atrophy. Vascular: No hyperdense vessels.  Carotid vascular calcification Skull: Normal. Negative for fracture or focal lesion. Sinuses/Orbits: Opacified left maxillary sinus. Other: None IMPRESSION: 1. No definite CT evidence for acute intracranial abnormality. 2. Small age indeterminate hypodensity in the right thalamus which may reflect lacunar infarct or small vessel disease. Extensive white matter hypodensity consistent with chronic small vessel ischemic change, progressed compared to prior head CT. Electronically Signed   By: Donavan Foil M.D.   On: 06/17/2021 19:18   DG Chest Portable 1 View  Result Date: 06/17/2021 CLINICAL DATA:  Hypoxia EXAM: PORTABLE CHEST 1 VIEW COMPARISON:  May 05, 2021 FINDINGS: Enlarged cardiac silhouette, similar prior. Low lung volumes with bibasilar atelectasis. No significant interval change in the persistent bilateral interstitial thickening. No new focal consolidation. No visible pleural effusion or pneumothorax. The visualized skeletal structures are unchanged. IMPRESSION: 1. No significant interval change in the persistent bilateral interstitial thickening  with superimposed atelectasis. 2. Stable cardiomegaly. Electronically Signed   By: Dahlia Bailiff M.D.   On: 06/17/2021 16:10   DG Shoulder Left  Result Date: 07/15/2021 CLINICAL DATA:  81 year old female with pain after fall. EXAM: LEFT SHOULDER - 2+ VIEW COMPARISON:  Chest radiographs 03/07/2021 and earlier. FINDINGS: Chronic severe glenohumeral joint space loss with bulky osteophytosis. Chronic superior subluxation of the humeral head in keeping with chronic rotator cuff deficiency. Proximal left humerus intact. No acute fracture or dislocation identified. Visible left ribs appear intact. IMPRESSION:  Advanced degenerative changes. No acute fracture or dislocation identified . Electronically Signed   By: Genevie Ann M.D.   On: 07/15/2021 07:29   ECHOCARDIOGRAM COMPLETE  Result Date: 06/18/2021    ECHOCARDIOGRAM REPORT   Patient Name:   EZABELLA TESKA Date of Exam: 06/18/2021 Medical Rec #:  [id number]        Height:       66.0 in Accession #:    [id number]       Weight:       205.7 lb Date of Birth:  Aug 08, 1940        BSA:          2.024 m Patient Age:    46 years         BP:           146/95 mmHg Patient Gender: F                HR:           141 bpm. Exam Location:  [facility] Procedure: 2D Echo, Cardiac Doppler and Color Doppler Indications:     CHF-acute systolic 300.76 / A26.33  History:         Patient has prior history of Echocardiogram examinations, most                  recent 03/21/2021. COPD, Arrythmias:Atrial Fibrillation and                  Atrial Flutter; Risk Factors:Hypertension and Dyslipidemia.  Sonographer:     Sherrie Sport Referring Phys:  [id number] Gretta Cool PATEL Diagnosing Phys: Nelva Bush MD  Sonographer Comments: Suboptimal apical window. IMPRESSIONS  1. Left ventricular ejection fraction, by estimation, is 35 to 40%. The left ventricle has moderately decreased function. The left ventricle demonstrates global hypokinesis. There is mild left ventricular hypertrophy. Left ventricular diastolic function  could not be evaluated.  2. Right ventricular systolic function is moderately reduced. The right ventricular size is normal. There is moderately elevated pulmonary artery systolic pressure.  3. Left atrial size was moderately dilated.  4. Right atrial size was moderately dilated.  5. The mitral valve is degenerative. Mild mitral valve regurgitation. No evidence of mitral stenosis. Severe mitral annular calcification.  6. Tricuspid valve regurgitation is moderate.  7. The aortic valve is tricuspid. There is mild thickening of the aortic valve. Aortic valve regurgitation is not visualized. Aortic  valve sclerosis is present, with no evidence of aortic valve stenosis.  8. The inferior vena cava is normal in size with greater than 50% respiratory variability, suggesting right atrial pressure of 3 mmHg. FINDINGS  Left Ventricle: Left ventricular ejection fraction, by estimation, is 35 to 40%. The left ventricle has moderately decreased function. The left ventricle demonstrates global hypokinesis. The left ventricular internal cavity size was normal in size. There is mild left ventricular hypertrophy. Left ventricular diastolic function could not be evaluated due to atrial fibrillation. Left ventricular  diastolic function could not be evaluated. Right Ventricle: The right ventricular size is normal. No increase in right ventricular wall thickness. Right ventricular systolic function is moderately reduced. There is moderately elevated pulmonary artery systolic pressure. The tricuspid regurgitant velocity is 3.35 m/s, and with an assumed right atrial pressure of 3 mmHg, the estimated right ventricular systolic pressure is 62.9 mmHg. Left Atrium: Left atrial size was moderately dilated. Right Atrium: Right atrial size was moderately dilated. Pericardium: Trivial pericardial effusion is present. Mitral Valve: The mitral valve is degenerative in appearance. There is mild thickening of the mitral valve leaflet(s). Severe mitral annular calcification. Mild mitral valve regurgitation. No evidence of mitral valve stenosis. MV peak gradient, 3.3 mmHg.  The mean mitral valve gradient is 1.0 mmHg. Tricuspid Valve: The tricuspid valve is normal in structure. Tricuspid valve regurgitation is moderate. Aortic Valve: The aortic valve is tricuspid. There is mild thickening of the aortic valve. Aortic valve regurgitation is not visualized. Aortic valve sclerosis is present, with no evidence of aortic valve stenosis. Aortic valve mean gradient measures 2.0  mmHg. Aortic valve peak gradient measures 4.0 mmHg. Aortic valve area, by  VTI measures 2.72 cm. Pulmonic Valve: The pulmonic valve was grossly normal. Pulmonic valve regurgitation is trivial. No evidence of pulmonic stenosis. Aorta: The aortic root is normal in size and structure. Venous: The inferior vena cava is normal in size with greater than 50% respiratory variability, suggesting right atrial pressure of 3 mmHg. IAS/Shunts: No atrial level shunt detected by color flow Doppler.  LEFT VENTRICLE PLAX 2D LVIDd:         4.20 cm LVIDs:         3.40 cm LV PW:         1.20 cm LV IVS:        1.10 cm LVOT diam:     2.00 cm LV SV:         32 LV SV Index:   16 LVOT Area:     3.14 cm  RIGHT VENTRICLE RV Basal diam:  3.50 cm RV S prime:     10.40 cm/s LEFT ATRIUM             Index        RIGHT ATRIUM           Index LA diam:        3.20 cm 1.58 cm/m   RA Area:     25.00 cm LA Vol (A2C):   99.1 ml 48.97 ml/m  RA Volume:   82.60 ml  40.81 ml/m LA Vol (A4C):   83.5 ml 41.26 ml/m LA Biplane Vol: 95.8 ml 47.34 ml/m  AORTIC VALVE                    PULMONIC VALVE AV Area (Vmax):    2.26 cm     PV Vmax:        0.61 m/s AV Area (Vmean):   2.15 cm     PV Vmean:       40.400 cm/s AV Area (VTI):     2.72 cm     PV VTI:         0.077 m AV Vmax:           100.00 cm/s  PV Peak grad:   1.5 mmHg AV Vmean:          61.200 cm/s  PV Mean grad:   1.0 mmHg AV VTI:  0.118 m      RVOT Peak grad: 3 mmHg AV Peak Grad:      4.0 mmHg AV Mean Grad:      2.0 mmHg LVOT Vmax:         71.80 cm/s LVOT Vmean:        41.800 cm/s LVOT VTI:          0.102 m LVOT/AV VTI ratio: 0.86  AORTA Ao Root diam: 3.20 cm MITRAL VALVE                TRICUSPID VALVE MV Area (PHT): 6.12 cm     TR Peak grad:   44.9 mmHg MV Area VTI:   2.91 cm     TR Vmax:        335.00 cm/s MV Peak grad:  3.3 mmHg MV Mean grad:  1.0 mmHg     SHUNTS MV Vmax:       0.90 m/s     Systemic VTI:  0.10 m MV Vmean:      52.9 cm/s    Systemic Diam: 2.00 cm MV Decel Time: 124 msec     Pulmonic VTI:  0.101 m MV E velocity: 119.00 cm/s Nelva Bush MD  Electronically signed by Nelva Bush MD Signature Date/Time: 06/18/2021/11:44:19 AM    Final       Labs: BNP (last 3 results) Recent Labs    03/07/21 1223 05/05/21 0246 06/17/21 1802  BNP 386.7* 249.5* 818.2*   Basic Metabolic Panel: Recent Labs  Lab 07/15/21 0555 07/16/21 0608  NA 137 137  K 4.0 3.6  CL 102 102  CO2 26 28  GLUCOSE 153* 107*  BUN 17 13  CREATININE 1.07* 0.77  CALCIUM 9.1 8.6*   Liver Function Tests: Recent Labs  Lab 07/15/21 0555  AST 27  ALT 18  ALKPHOS 56  BILITOT 1.4*  PROT 7.5  ALBUMIN 3.8   No results for input(s): LIPASE, AMYLASE in the last 168 hours. No results for input(s): AMMONIA in the last 168 hours. CBC: Recent Labs  Lab 07/15/21 0555 07/16/21 0608  WBC 6.1 4.9  NEUTROABS 4.1  --   HGB 16.1* 14.3  HCT 48.7* 42.9  MCV 103.8* 101.9*  PLT 153 129*   Cardiac Enzymes: No results for input(s): CKTOTAL, CKMB, CKMBINDEX, TROPONINI in the last 168 hours. BNP: Invalid input(s): POCBNP CBG: No results for input(s): GLUCAP in the last 168 hours. D-Dimer No results for input(s): DDIMER in the last 72 hours. Hgb A1c No results for input(s): HGBA1C in the last 72 hours. Lipid Profile No results for input(s): CHOL, HDL, LDLCALC, TRIG, CHOLHDL, LDLDIRECT in the last 72 hours. Thyroid function studies Recent Labs    07/15/21 0838  TSH 3.600   Anemia work up No results for input(s): VITAMINB12, FOLATE, FERRITIN, TIBC, IRON, RETICCTPCT in the last 72 hours. Urinalysis    Component Value Date/Time   COLORURINE YELLOW (A) 07/15/2021 0644   APPEARANCEUR HAZY (A) 07/15/2021 0644   LABSPEC 1.012 07/15/2021 0644   PHURINE 5.0 07/15/2021 0644   GLUCOSEU NEGATIVE 07/15/2021 0644   HGBUR MODERATE (A) 07/15/2021 0644   BILIRUBINUR NEGATIVE 07/15/2021 0644   KETONESUR NEGATIVE 07/15/2021 0644   PROTEINUR NEGATIVE 07/15/2021 0644   UROBILINOGEN 1.0 05/29/2009 2253   NITRITE POSITIVE (A) 07/15/2021 0644   LEUKOCYTESUR TRACE (A)  07/15/2021 0644   Sepsis Labs Invalid input(s): PROCALCITONIN,  WBC,  LACTICIDVEN Microbiology Recent Results (from the past 240 hour(s))  Resp Panel by RT-PCR (Flu A&B,  Covid) Nasopharyngeal Swab     Status: None   Collection Time: 07/15/21  6:48 AM   Specimen: Nasopharyngeal Swab; Nasopharyngeal(NP) swabs in vial transport medium  Result Value Ref Range Status   SARS Coronavirus 2 by RT PCR NEGATIVE NEGATIVE Final    Comment: (NOTE) SARS-CoV-2 target nucleic acids are NOT DETECTED.  The SARS-CoV-2 RNA is generally detectable in upper respiratory specimens during the acute phase of infection. The lowest concentration of SARS-CoV-2 viral copies this assay can detect is 138 copies/mL. A negative result does not preclude SARS-Cov-2 infection and should not be used as the sole basis for treatment or other patient management decisions. A negative result may occur with  improper specimen collection/handling, submission of specimen other than nasopharyngeal swab, presence of viral mutation(s) within the areas targeted by this assay, and inadequate number of viral copies(<138 copies/mL). A negative result must be combined with clinical observations, patient history, and epidemiological information. The expected result is Negative.  Fact Sheet for Patients:  EntrepreneurPulse.com.au  Fact Sheet for Healthcare Providers:  IncredibleEmployment.be  This test is no t yet approved or cleared by the [place] FDA and  has been authorized for detection and/or diagnosis of SARS-CoV-2 by FDA under an Emergency Use Authorization (EUA). This EUA will remain  in effect (meaning this test can be used) for the duration of the COVID-19 declaration under Section 564(b)(1) of the Act, 21 U.S.C.section 360bbb-3(b)(1), unless the authorization is terminated  or revoked sooner.       Influenza A by PCR NEGATIVE NEGATIVE Final   Influenza B by PCR NEGATIVE  NEGATIVE Final    Comment: (NOTE) The Xpert Xpress SARS-CoV-2/FLU/RSV plus assay is intended as an aid in the diagnosis of influenza from Nasopharyngeal swab specimens and should not be used as a sole basis for treatment. Nasal washings and aspirates are unacceptable for Xpert Xpress SARS-CoV-2/FLU/RSV testing.  Fact Sheet for Patients: EntrepreneurPulse.com.au  Fact Sheet for Healthcare Providers: IncredibleEmployment.be  This test is not yet approved or cleared by the [place] FDA and has been authorized for detection and/or diagnosis of SARS-CoV-2 by FDA under an Emergency Use Authorization (EUA). This EUA will remain in effect (meaning this test can be used) for the duration of the COVID-19 declaration under Section 564(b)(1) of the Act, 21 U.S.C. section 360bbb-3(b)(1), unless the authorization is terminated or revoked.  Performed at [facility], [address], [place], [place] [address]   Urine Culture     Status: Abnormal (Preliminary result)   Collection Time: 07/15/21  7:02 AM   Specimen: Urine, Random  Result Value Ref Range Status   Specimen Description   Final    URINE, RANDOM Performed at [facility], [address], [place], [place] [address]    Special Requests   Final    NONE Performed at [facility], [address], [place],  [address]    Culture >=100,000 COLONIES/mL GRAM NEGATIVE RODS (A)  Final   Report Status PENDING  Incomplete     Total time spend on discharging this patient, including the last patient exam, discussing the hospital stay, instructions for ongoing care as it relates to all pertinent caregivers, as well as preparing the medical discharge records, prescriptions, and/or referrals as applicable, is 40 minutes.    Enzo Bi, MD  [facility] 07/16/2021, 11:49 AM

## 2021-07-16 NOTE — Progress Notes (Signed)
°  Chaplain On-Call responded to [place] Order for Advance Directives information for the patient.  Chaplain conferred with RN Tiffanie, who stated that the patient is not alert and oriented, and would not be able to understand the documents.  Nurse stated also that she will cancel the Consult Order.  Chaplain Pollyann Samples M.Div., [facility]

## 2021-07-16 NOTE — Progress Notes (Signed)
Nutrition Brief Note  Patient identified on the Malnutrition Screening Tool (MST) Report.  Wt Readings from Last 15 Encounters:  07/15/21 87.7 kg  06/25/21 86.6 kg  05/05/21 93.3 kg  03/07/21 90.3 kg  02/21/21 91.5 kg  01/09/21 89 kg  09/10/20 99.5 kg  06/27/20 96.6 kg  12/05/19 99.8 kg  11/14/19 100.7 kg  08/23/19 101.5 kg  07/21/19 100.9 kg  06/15/19 102.4 kg  01/19/19 102.8 kg  01/15/19 102.5 kg  06/19/21         88.8 kg  81 yo female with a PMH of COPD with chronic respiratory failure on 4 L of oxygen, history of A. fib on anticoagulation therapy, history of chronic low back pain, anxiety and depression, history of CHF presents to the ER via EMS for evaluation of multiple falls over the last 24 hours.  Spoke with pt at bedside. Pt reports that she has been eating pretty well at her assisted living facility, but she has noticed that she cannot eat as much as she used to. Explained that this is a natural process with aging.  Observed 75% of breakfast tray eaten.  Weight remaining relatively stable over the past four months. No significant weight change.  On exam, pt with no significant depletions.  Body mass index is 31.21 kg/m.   Current diet order is 2 gram Na, patient is consuming approximately 75% of meals at this time. Labs and medications reviewed.   No nutrition interventions warranted at this time. If nutrition issues arise, please consult RD.   Derrel Nip, RD, LDN (she/her/hers) Clinical Inpatient Dietitian RD Pager/After-Hours/Weekend Pager # in [place]

## 2021-07-16 NOTE — Progress Notes (Signed)
Patient's daughter Giani Winther updated via phone on POC. Requesting MD to call her once rounds are completed.

## 2021-07-16 NOTE — TOC Initial Note (Addendum)
Transition of Care [facility]) - Initial/Assessment Note    Patient Details  Name: [name] MRN: [id number] Date of Birth: 14-Nov-1940  Transition of Care [facility]) CM/SW Contact:    Pete Pelt, RN Phone Number: 07/16/2021, 1:39 PM  Clinical Narrative:     Patient is a resident of [facility] to assess patient at hospital prior to return.              Addendum 1425:  Per lisa discharge to [facility] today with [facility] EMS called daughter aware.  Expected Discharge Plan:  ([facility] memory care) Barriers to Discharge: Barriers Resolved   Patient Goals and CMS Choice Patient states their goals for this hospitalization and ongoing recovery are:: to go back to [facility]   Choice offered to / list presented to : NA  Expected Discharge Plan and Services Expected Discharge Plan:  ([facility] care)   Discharge Planning Services: CM Consult   Living arrangements for the past 2 months:  [name]) Expected Discharge Date: 07/16/21                                    Prior Living Arrangements/Services Living arrangements for the past 2 months:  ([facility]) Lives with:: Facility Resident Patient language and need for interpreter reviewed:: Yes (No interpreter required) Do you feel safe going back to the place where you live?: Yes      Need for Family Participation in Patient Care: Yes (Comment) Care giver support system in place?: Yes (comment)   Criminal Activity/Legal Involvement Pertinent to Current Situation/Hospitalization: No - Comment as needed  Activities of Daily Living Home Assistive Devices/Equipment: Walker (specify type) ADL Screening (condition at time of admission) Patient's cognitive ability adequate to safely complete daily activities?: Yes Is the patient deaf or have difficulty hearing?: No Does the patient have difficulty seeing, even when wearing glasses/contacts?: No Does the patient have difficulty concentrating, remembering, or making  decisions?: No Patient able to express need for assistance with ADLs?: Yes Does the patient have difficulty dressing or bathing?: Yes Independently performs ADLs?: No Communication: Independent Dressing (OT): Needs assistance Is this a change from baseline?: Pre-admission baseline Grooming: Independent Feeding: Independent Bathing: Needs assistance Is this a change from baseline?: Pre-admission baseline Toileting: Needs assistance Is this a change from baseline?: Pre-admission baseline In/Out Bed: Needs assistance Is this a change from baseline?: Pre-admission baseline Walks in Home: Needs assistance Is this a change from baseline?: Pre-admission baseline Does the patient have difficulty walking or climbing stairs?: Yes Weakness of Legs: Both Weakness of Arms/Hands: Both  Permission Sought/Granted Permission sought to share information with : Case Manager, Chartered certified accountant granted to share information with : Yes, Verbal Permission Granted     Permission granted to share info w AGENCY: [facility]        Emotional Assessment Appearance:: Appears stated age       Alcohol / Substance Use: Not Applicable Psych Involvement: No (comment)  Admission diagnosis:  Weakness generalized [R53.1] Pain [R52] Atrial fibrillation with rapid ventricular response ([place]) [I48.91] Falls [W19.XXXA] Recurrent falls [R29.6] Fall, initial encounter [W19.XXXA] Acute midline low back pain without sciatica [M54.50] Patient Active Problem List   Diagnosis Date Noted   Falls 07/15/2021   AMS (altered mental status) 06/17/2021   Acute on chronic respiratory failure with hypoxia ([place]) 00/86/7619   Acute metabolic encephalopathy 50/93/2671   Acute lower UTI    Severe sepsis ([place])  Anticoagulant long-term use    Physical deconditioning 06/16/2019   Hypokalemia    Chronic systolic CHF (congestive heart failure) ([place]) 09/15/2016   Hyperlipidemia    Chronic respiratory  failure with hypoxia ([place]) 12/03/2014   Insomnia, chronic 01/27/2014   Atrial fibrillation with RVR ([place])    Multiple thyroid nodules 10/17/2012   AAA (abdominal aortic aneurysm) 05/11/2012   Bradycardia 01/23/2012   Hypothyroid 01/21/2012   HTN (hypertension) 01/21/2012   Edema 11/19/2011   Atrial flutter ([place]) 11/20/2010   Obstructive sleep apnea 10/16/2008   DVT 08/19/2008   COPD (chronic obstructive pulmonary disease) ([place]) 08/19/2008   NEOPLASM, MALIGNANT, LUNG, HX OF 74/82/7078   Diastolic heart failure, NYHA class 2 ([place]) 08/03/2008   Small cell carcinoma of lung ([place]) 06/23/2002   Hyperbilirubinemia 2004   PCP:  Burnard Bunting, MD Pharmacy:   [facility] [place], [place] [address] Phone: [phone] Fax: [phone]  [id number] [place], [place] - [address] [place] [address] Phone: [phone] Fax: [phone]  [id number] - [place], [place] [address] [place] [address] Phone: [phone] Fax: [phone]     Social Determinants of Health (SDOH) Interventions    Readmission Risk Interventions No flowsheet data found.

## 2021-07-16 NOTE — Progress Notes (Signed)
Attempt made x2 to call report to [facility] where patient will be returning. No answer either time.

## 2021-07-17 DIAGNOSIS — J9621 Acute and chronic respiratory failure with hypoxia: Secondary | ICD-10-CM | POA: Diagnosis not present

## 2021-07-17 DIAGNOSIS — J449 Chronic obstructive pulmonary disease, unspecified: Secondary | ICD-10-CM | POA: Diagnosis not present

## 2021-07-17 DIAGNOSIS — N390 Urinary tract infection, site not specified: Secondary | ICD-10-CM | POA: Diagnosis not present

## 2021-07-17 DIAGNOSIS — A4151 Sepsis due to Escherichia coli [E. coli]: Secondary | ICD-10-CM | POA: Diagnosis not present

## 2021-07-17 DIAGNOSIS — I110 Hypertensive heart disease with heart failure: Secondary | ICD-10-CM | POA: Diagnosis not present

## 2021-07-17 DIAGNOSIS — I5042 Chronic combined systolic (congestive) and diastolic (congestive) heart failure: Secondary | ICD-10-CM | POA: Diagnosis not present

## 2021-07-17 LAB — URINE CULTURE: Culture: >=100000 — AB

## 2021-07-18 DIAGNOSIS — I130 Hypertensive heart and chronic kidney disease with heart failure and stage 1 through stage 4 chronic kidney disease, or unspecified chronic kidney disease: Secondary | ICD-10-CM | POA: Diagnosis not present

## 2021-07-18 DIAGNOSIS — J449 Chronic obstructive pulmonary disease, unspecified: Secondary | ICD-10-CM | POA: Diagnosis not present

## 2021-07-18 DIAGNOSIS — R269 Unspecified abnormalities of gait and mobility: Secondary | ICD-10-CM | POA: Diagnosis not present

## 2021-07-18 DIAGNOSIS — I5042 Chronic combined systolic (congestive) and diastolic (congestive) heart failure: Secondary | ICD-10-CM | POA: Diagnosis not present

## 2021-07-18 DIAGNOSIS — R296 Repeated falls: Secondary | ICD-10-CM | POA: Diagnosis not present

## 2021-07-18 DIAGNOSIS — N1831 Chronic kidney disease, stage 3a: Secondary | ICD-10-CM | POA: Diagnosis not present

## 2021-07-18 DIAGNOSIS — I4891 Unspecified atrial fibrillation: Secondary | ICD-10-CM | POA: Diagnosis not present

## 2021-07-19 DIAGNOSIS — A4151 Sepsis due to Escherichia coli [E. coli]: Secondary | ICD-10-CM | POA: Diagnosis not present

## 2021-07-19 DIAGNOSIS — N390 Urinary tract infection, site not specified: Secondary | ICD-10-CM | POA: Diagnosis not present

## 2021-07-19 DIAGNOSIS — J9621 Acute and chronic respiratory failure with hypoxia: Secondary | ICD-10-CM | POA: Diagnosis not present

## 2021-07-19 DIAGNOSIS — I5042 Chronic combined systolic (congestive) and diastolic (congestive) heart failure: Secondary | ICD-10-CM | POA: Diagnosis not present

## 2021-07-19 DIAGNOSIS — I110 Hypertensive heart disease with heart failure: Secondary | ICD-10-CM | POA: Diagnosis not present

## 2021-07-19 DIAGNOSIS — J449 Chronic obstructive pulmonary disease, unspecified: Secondary | ICD-10-CM | POA: Diagnosis not present

## 2021-07-20 DIAGNOSIS — I4892 Unspecified atrial flutter: Secondary | ICD-10-CM | POA: Diagnosis not present

## 2021-07-20 DIAGNOSIS — F32A Depression, unspecified: Secondary | ICD-10-CM | POA: Diagnosis not present

## 2021-07-20 DIAGNOSIS — F419 Anxiety disorder, unspecified: Secondary | ICD-10-CM | POA: Diagnosis not present

## 2021-07-20 DIAGNOSIS — N390 Urinary tract infection, site not specified: Secondary | ICD-10-CM | POA: Diagnosis not present

## 2021-07-20 DIAGNOSIS — Z9181 History of falling: Secondary | ICD-10-CM | POA: Diagnosis not present

## 2021-07-20 DIAGNOSIS — Z9981 Dependence on supplemental oxygen: Secondary | ICD-10-CM | POA: Diagnosis not present

## 2021-07-20 DIAGNOSIS — I110 Hypertensive heart disease with heart failure: Secondary | ICD-10-CM | POA: Diagnosis not present

## 2021-07-20 DIAGNOSIS — E039 Hypothyroidism, unspecified: Secondary | ICD-10-CM | POA: Diagnosis not present

## 2021-07-20 DIAGNOSIS — I4891 Unspecified atrial fibrillation: Secondary | ICD-10-CM | POA: Diagnosis not present

## 2021-07-20 DIAGNOSIS — E785 Hyperlipidemia, unspecified: Secondary | ICD-10-CM | POA: Diagnosis not present

## 2021-07-20 DIAGNOSIS — J9621 Acute and chronic respiratory failure with hypoxia: Secondary | ICD-10-CM | POA: Diagnosis not present

## 2021-07-20 DIAGNOSIS — I5042 Chronic combined systolic (congestive) and diastolic (congestive) heart failure: Secondary | ICD-10-CM | POA: Diagnosis not present

## 2021-07-20 DIAGNOSIS — J449 Chronic obstructive pulmonary disease, unspecified: Secondary | ICD-10-CM | POA: Diagnosis not present

## 2021-07-20 DIAGNOSIS — Z7901 Long term (current) use of anticoagulants: Secondary | ICD-10-CM | POA: Diagnosis not present

## 2021-07-20 DIAGNOSIS — Z85118 Personal history of other malignant neoplasm of bronchus and lung: Secondary | ICD-10-CM | POA: Diagnosis not present

## 2021-07-20 DIAGNOSIS — I739 Peripheral vascular disease, unspecified: Secondary | ICD-10-CM | POA: Diagnosis not present

## 2021-07-24 DIAGNOSIS — J449 Chronic obstructive pulmonary disease, unspecified: Secondary | ICD-10-CM | POA: Diagnosis not present

## 2021-07-24 DIAGNOSIS — J9621 Acute and chronic respiratory failure with hypoxia: Secondary | ICD-10-CM | POA: Diagnosis not present

## 2021-07-24 DIAGNOSIS — I110 Hypertensive heart disease with heart failure: Secondary | ICD-10-CM | POA: Diagnosis not present

## 2021-07-24 DIAGNOSIS — N390 Urinary tract infection, site not specified: Secondary | ICD-10-CM | POA: Diagnosis not present

## 2021-07-24 DIAGNOSIS — I4891 Unspecified atrial fibrillation: Secondary | ICD-10-CM | POA: Diagnosis not present

## 2021-07-24 DIAGNOSIS — I5042 Chronic combined systolic (congestive) and diastolic (congestive) heart failure: Secondary | ICD-10-CM | POA: Diagnosis not present

## 2021-07-24 NOTE — Progress Notes (Signed)
Cardiology Clinic Note   Patient Name: [name] Date of Encounter: 07/25/2021  Primary Care Provider:  Burnard Bunting, MD Primary Cardiologist:  Peter Martinique, MD  Patient Profile    [name] 81 year old female presents today for follow-up evaluation of her bradycardia and chronic combined systolic and diastolic CHF. Past Medical History    Past Medical History:  Diagnosis Date   AAA (abdominal aortic aneurysm)    Anticoagulant long-term use    Failed on Coumadin. On Xarelto   Anxiety    Anxiety and depression    Atrial fib/flutter, transient June 2012   Chronic lower back pain    Chronic respiratory failure with hypoxia ([place]) 12/03/2014   COPD (chronic obstructive pulmonary disease) ([place])    DVT (deep venous thrombosis) ([place]) 2004   BLE   Esophageal dysmotility    Exertional dyspnea    Hiatal hernia    History of bronchitis    History of fibrocystic disease of breast    History of uterine fibroid    HTN (hypertension)    Hyperlipidemia    Hypothyroidism    Normal nuclear stress test 2012   May 2012   OA (osteoarthritis)    "knees; left shoulder"   OSA (obstructive sleep apnea)    "haven't been using my CPAP lately" (01/21/12)   Ovarian mass    right benign   Ovarian mass    benign, right   PAD (peripheral artery disease) ([place])    Small cell carcinoma of lung ([place]) 2004   NON-SMALL CELL CARCINOMA OF THE LUNG, METASTATIC TO THE SUPRACLAVICULAR AND MEDIASTINAL LYMPH NODES; in remission   Past Surgical History:  Procedure Laterality Date   ABDOMINAL AORTIC ANEURYSM REPAIR  ~ 2010   stent graft   BLADDER SURGERY  ~ 2003   sling   BREAST BIOPSY  1960's   both breast's - benign   CARDIOVASCULAR STRESS TEST  2012   No ischemia   CATARACT EXTRACTION W/ INTRAOCULAR LENS  IMPLANT, BILATERAL Bilateral ~ 2009   REPLACEMENT TOTAL KNEE Left ~ 2008   left   THYROIDECTOMY  ~ 1964    Allergies  No Known Allergies  History of Present Illness    Ms.  Ball has a past medical history of atrial fibrillation on Xarelto, diastolic CHF, COPD on home O2, anxiety, DVT, hypertension, hyperlipidemia, hypothyroidism, osteoarthritis, lung cancer, OSA, PVD, CHA2DS2-VASc score 7 (female, age x2, DVT x2, CHF, hypertension).   She was admitted to the hospital 3/26-09/28/2016 for A. fib RVR and CHF exacerbation.  She was not a candidate for anemia due to her lung disease, T consent and sotalol were also contraindicated due to her prolonged QT interval.  Multaq and flecainide are not good choices with congestive heart failure.  Choice was made to manage her symptoms with rate control and diuresis.   She was seen 05/2017 by Dr. Donnetta Hutching.  A CT showed stable saccular aneurysm.  She was also followed by Dr. Annamaria Boots for her chronic lung disease.   She was last seen by Dr. Martinique on 03/2018.  At that time she was doing well her breathing was stable.  She did experience shortness of breath with activity and she continued to use home oxygen.  Her weight was stable she denied any increased edema.  She continued to use metolazone sparingly as needed with good response.  However, she did not weigh regularly.  She was also having pain with her left shoulder and wanted to know if she  would be an acceptable surgical candidate.   She presented the clinic 07/21/19 and stated she called the office on the 25th due to her in-home health nurse noticing her low heart rate and increased fatigue.  She was instructed to decrease her metoprolol tartrate to 25 mg twice daily.  She also stated that she was taking care of her daughter who has amyloid and is receiving cancer treatment as well as her granddaughter who is mentally  and physically disabled.  She felt she had been managing fairly well with increased help from home health nursing.  She had been seeing pulmonary who were managing her home oxygen needs.     She presented to the clinic 08/23/2019 for follow-up and stated she had been doing well.   Her daughter was visiting from [place] and helping her with her other daughter.  Her daughter that had amyloid had been responding to treatment well.  She stated that she had started to climb the stairs in her house and that her weight had remained stable.  She continued to use 4 L nasal cannula through the day and 2 L at night.   She was still receiving assistance from home health.  She stated she had not been taking/needing her metolazone medication.  I  refilled her prescription  and planned follow-up with Dr. Martinique in 3 months.  She was seen by Dr. Martinique on 06/27/2020.  During that time she was noted to have stable chronic shortness of breath.  She was cardiac unaware.  Her PCP had switched her from Xarelto to apixaban.  She was unable to afford Xarelto.  Her weight was down and her swelling stable.  She was only taking furosemide 80 mg daily and continue to not need her metolazone.  She presented to the clinic 02/21/21 for follow-up evaluation stated she had moved into senior living.  She reported that she received her medications on a scheduled basis and was enjoying living there.  She reported that her daughter from [place] had gone back.  Her daughter with amyloidosis continued to receive treatment and was doing fairly well.  She reported that she felt her breathing and activity tolerance had gotten somewhat worse.  She had lost around 20 pounds.  Her blood pressure was 128/66 with a heart rate of 61.  She continued to try to eat well.  She reported that she enjoyed eating Brussels sprouts which were on the menu.   I will ordered an echocardiogram, continued her current medication regimen, ordered a CBC and BMP, and planned follow-up in 6 months.   She was admitted to the hospital 07/15/2021 and discharged on 07/16/2021 with COPD exacerbation and reporting multiple falls out of bed.  She was ruled out for UTI.  She received 1 dose of IV diltiazem and her heart rate improved.  Strategies for staying safe for  reviewed.  She presents to the clinic today for follow-up evaluation states she feels well.  We reviewed her hospitalization from the end of January.  She presents with her daughter Anne Ng.  She is working on getting a low bed.  They have switched her mattress covers to fabric mattress covers which were causing her to fall.  We reviewed the importance of her Eliquis medication.  We also reviewed the increased risk with her following.  At this time they wish to continue anticoagulation.  I have instructed them that if there is another follow-up we will reassess whether there is too much risk for continuing Eliquis.  She lives at [facility] assisted living facility and they do not transport to [facility].  She wishes to transfer care to [facility].  We will request follow-up with [facility] office.  No further testing is needed at this time.  We will plan follow-up for 3 to 4 months.  Today she denies chest pain, increased shortness of breath, increased lower extremity edema, fatigue, increased palpitations, melena, hematuria, hemoptysis, diaphoresis, weakness, presyncope, syncope, orthopnea, and PND.  Home Medications    Prior to Admission medications   Medication Sig Start Date End Date Taking? Authorizing Provider  albuterol (VENTOLIN HFA) 108 (90 Base) MCG/ACT inhaler TAKE 2 PUFFS EVERY 6 HOURS AS NEEDED FORSHORTNESS OF BREATH/WHEEZING 09/10/20   Baird Lyons D, MD  apixaban (ELIQUIS) 5 MG TABS tablet Take 1 tablet (5 mg total) by mouth 2 (two) times daily. 06/27/20   Martinique, Peter M, MD  CALCIUM-MAGNESIUM-ZINC PO Take 1 tablet by mouth daily.     [provider]  cephALEXin (KEFLEX) 500 MG capsule Take 1 capsule (500 mg total) by mouth 4 (four) times daily. 11/18/20   Hayden Rasmussen, MD  Cholecalciferol (VITAMIN D) 1000 UNITS capsule Take 1,000 Units by mouth daily.    [provider]  citalopram (CELEXA) 20 MG tablet Take 20 mg by mouth daily.    [provider]  digoxin (LANOXIN) 0.125 MG tablet Take 1 tablet (125 mcg total) by mouth daily. 12/21/20   Martinique, Peter M, MD  furosemide (LASIX) 40 MG tablet Take 2 tablets ( 80 mg ) daily 12/21/20   Martinique, Peter M, MD  gabapentin (NEURONTIN) 100 MG capsule Take 1 capsule by mouth 3 (three) times daily.    [provider]  levothyroxine (SYNTHROID, LEVOTHROID) 88 MCG tablet Take 88 mcg by mouth daily.    [provider]  Lidocaine (HM LIDOCAINE PATCH) 4 % PTCH Apply 1 patch topically as needed. Patient not taking: Reported on 09/10/2020 11/30/19   Faustino Congress, NP  megestrol (MEGACE) 40 MG tablet Take 40 mg by mouth daily.     [provider]  metolazone (ZAROXOLYN) 2.5 MG tablet Take 1 tablet (2.5 mg total) by mouth daily as needed (ONE DAILY PRN WEIGHT GAIN). USE AS NEEDED FOR WEIGHT GAIN 09/06/19   Deberah Pelton, NP  metoprolol tartrate (LOPRESSOR) 25 MG tablet TAKE 1/2 TABLET TWICE DAILY 11/08/20   Martinique, Peter M, MD  morphine (MSIR) 15 MG tablet Take 5 mg by mouth as needed. 10/13/16   [provider]  mupirocin ointment (BACTROBAN) 2 % mupirocin 2 % topical ointment  APPLY TO WOUNDS ON SKIN ONCE DAILY AT BANDAGE CHANGES    [provider]  OXYGEN Inhale 4 L into the lungs daily. Patient states she wears 2L at night and 4L when up and moving    [provider]  potassium chloride (KLOR-CON) 10 MEQ tablet Take 1 tablet (10 mEq total) by mouth 2 (two) times daily. 12/21/20   Martinique, Peter M, MD  temazepam (RESTORIL) 15 MG capsule TAKE 1 CAPSULE AT BEDTIME AS NEEDED  FOR  SLEEP 07/16/16   Baird Lyons D, MD  tiotropium (SPIRIVA HANDIHALER) 18 MCG inhalation capsule Place 1 capsule (18 mcg total) into inhaler and inhale daily. 09/10/20   Deneise Lever, MD  tiZANidine (ZANAFLEX) 2 MG tablet Take 1 tablet (2 mg total) by mouth every 6 (six) hours as needed for muscle spasms. 11/30/19   Faustino Congress, NP  vitamin B-12 (CYANOCOBALAMIN) 100 MCG tablet  Take 100 mcg by mouth daily.    [provider]    Family History    Family History  Problem Relation Age of Onset   Heart disease Father    Heart attack Father    Diabetes type II Mother    Diabetes Mother    Hypertension Mother    Heart disease Brother        before age 59   Heart disease Sister        See's Dr. Acie Fredrickson   Breast cancer Neg Hx    She indicated that her mother is deceased. She indicated that her father is deceased. She indicated that only one of her four sisters is alive. She indicated that two of her three brothers are alive. She indicated that both of her daughters are alive. She indicated that the status of her neg hx is unknown.   Social History    Social History   Socioeconomic History   Marital status: Divorced    Spouse name: Not on file   Number of children: 2   Years of education: Not on file   Highest education level: Not on file  Occupational History   Occupation: retired    Comment: Chiropractor  Tobacco Use   Smoking status: Former    Packs/day: 1.00    Years: 40.00    Pack years: 40.00    Types: Cigarettes    Quit date: 06/23/2001    Years since quitting: 20.1   Smokeless tobacco: Never  Vaping Use   Vaping Use: Never used  Substance and Sexual Activity   Alcohol use: Yes    Alcohol/week: 0.0 standard drinks    Comment: once a week (glass of wine)   Drug use: No   Sexual activity: Never  Other Topics Concern   Not on file  Social History Narrative   Not on file   Social Determinants of Health   Financial Resource Strain: Not on file  Food Insecurity: Not on file  Transportation Needs: Not on file  Physical Activity: Not on file  Stress: Not on file  Social Connections: Not on file  Intimate Partner Violence: Not on file     Review of Systems    General:  No chills, fever, night sweats or weight changes.  Cardiovascular:  No chest pain, dyspnea on exertion, edema, orthopnea, palpitations, paroxysmal nocturnal  dyspnea. Dermatological: No rash, lesions/masses Respiratory: No cough, dyspnea Urologic: No hematuria, dysuria Abdominal:   No nausea, vomiting, diarrhea, bright red blood per rectum, melena, or hematemesis Neurologic:  No visual changes, wkns, changes in mental status. All other systems reviewed and are otherwise negative except as noted above.  Physical Exam    VS:  BP 112/64    Pulse 92    Ht 5' (1.524 m)    Wt 194 lb 6.4 oz (88.2 kg)    SpO2 98%    BMI 37.97 kg/m  , BMI Body mass index is 37.97 kg/m. GEN: Well nourished, well developed, in no acute distress. HEENT: normal. Neck: Supple, no JVD, carotid bruits, or masses. Cardiac: RRR, no murmurs, rubs, or gallops. No clubbing, cyanosis, generalized bilateral lower extremity nonpitting edema.  Radials/DP/PT 2+ and equal bilaterally.  Respiratory:  Respirations regular and unlabored, clear to auscultation bilaterally. GI: Soft, nontender, nondistended, BS + x 4. MS: no deformity or atrophy. Skin: warm and dry, no rash. Neuro:  Strength and sensation are intact. Psych: Normal affect.  Accessory Clinical Findings    Recent Labs:  06/17/2021: B Natriuretic Peptide 363.5 06/25/2021: Magnesium 2.0 07/15/2021: ALT 18; TSH 3.600 07/16/2021: BUN 13; Creatinine, Ser 0.77; Hemoglobin 14.3; Platelets 129; Potassium 3.6; Sodium 137   Recent Lipid Panel No results found for: CHOL, TRIG, HDL, CHOLHDL, VLDL, LDLCALC, LDLDIRECT  ECG personally reviewed by me today-none today.  Echocardiogram 09/17/2016  - Left ventricle: The cavity size was normal. Wall thickness was   increased in a pattern of moderate LVH. Systolic function was   moderately reduced. The estimated ejection fraction was in the   range of 35% to 40%. Diffuse hypokinesis. The study is not   technically sufficient to allow evaluation of LV diastolic function. - Aortic valve: Trileaflet. Sclerosis without stenosis. There was   no regurgitation. - Mitral valve: Calcified  annulus. Mildly thickened leaflets . - Left atrium: The atrium was mildly dilated. - Right ventricle: The cavity size was normal. Mildly decreased RV   systolic function. - Right atrium: Moderately dilated. - Tricuspid valve: There was moderate regurgitation. - Pulmonary arteries: PA peak pressure: 49 mm Hg (S). - Inferior vena cava: The vessel was dilated. The respirophasic   diameter changes were blunted (< 50%), consistent with elevated   central venous pressure.  Impressions:  - Compared to a prior study in 2016, the LVEF is reduced now to   35-40%. A-fib wtih RVR is noted. The ascending aorta meausres 4.0   cm. There is mild AI, mild LAE, moderate RAE, moderate TR and   RVSP of 49 mmHg with a dilated IVC.  Assessment & Plan   1.   Chronic combined systolic/diastolic heart failure -generalized  bilateral lower extremity edema today.  No increased shortness of breath. Continue metoprolol tartrate, furosemide,  digoxin Heart healthy low-sodium diet-salty 6 given Continue fluid restriction Increase physical activity as tolerated   Bradycardia-heart rate today 92 bpm.    No increased fatigue or dizziness.  Denies presyncope or syncope. Continue metoprolol  Continue heart healthy diet   COPD-continues to use home O2 4 L liters through the day and 2 L at night.  Stable. Monitored by pulmonary   Persistent atrial fibrillation-heart rate today 92.  Reports compliance with Eliquis and denies bleeding issues.  Recently noted to have multiple falls from bed.  Now sleeping and low bed with floor mats. This patients CHA2DS2-VASc Score and unadjusted Ischemic Stroke Rate (% per year) is equal to 7.2 % stroke rate/year from a score of 5 CHF, HTN,  Female,Age > 75.  Discussed risks of continuing DOAC.  Understands risks and wishes to continue Eliquis at this time. Continue Eliquis, metoprolol Fall precautions  Hypercholesterolemia-LDL 120 01/19/20  Heart healthy low-sodium high-fiber  diet Increase physical activity as tolerated Request labs from PCP   Disposition: Follow-up with Dr. Martinique or me 3-4 months.   Jossie Ng. Makaelyn Aponte NP-C    07/25/2021, 3:17 PM [facility] [address] Office [phone] Fax [phone]  Notice: This dictation was prepared with Dragon dictation along with smaller phrase technology. Any transcriptional errors that result from this process are unintentional and may not be corrected upon review.  I spent 14 minutes examining this patient, reviewing medications, and using patient centered shared decision making involving her cardiac care.  Prior to her visit I spent greater than 20 minutes reviewing her past medical history,  medications, and prior cardiac tests.

## 2021-07-25 ENCOUNTER — Encounter: Payer: Self-pay | Admitting: General Practice

## 2021-07-25 ENCOUNTER — Ambulatory Visit (INDEPENDENT_AMBULATORY_CARE_PROVIDER_SITE_OTHER): Payer: Medicare Other | Admitting: General Practice

## 2021-07-25 ENCOUNTER — Telehealth: Payer: Self-pay | Admitting: Internal Medicine

## 2021-07-25 ENCOUNTER — Other Ambulatory Visit: Payer: Self-pay

## 2021-07-25 ENCOUNTER — Ambulatory Visit: Payer: Medicare Other | Admitting: General Practice

## 2021-07-25 VITALS — BP 112/64 | HR 92 | Ht 60.0 in | Wt 194.4 lb

## 2021-07-25 DIAGNOSIS — E039 Hypothyroidism, unspecified: Secondary | ICD-10-CM | POA: Diagnosis not present

## 2021-07-25 DIAGNOSIS — F32A Depression, unspecified: Secondary | ICD-10-CM | POA: Diagnosis not present

## 2021-07-25 DIAGNOSIS — E7800 Pure hypercholesterolemia, unspecified: Secondary | ICD-10-CM

## 2021-07-25 DIAGNOSIS — N390 Urinary tract infection, site not specified: Secondary | ICD-10-CM | POA: Diagnosis not present

## 2021-07-25 DIAGNOSIS — I509 Heart failure, unspecified: Secondary | ICD-10-CM | POA: Diagnosis not present

## 2021-07-25 DIAGNOSIS — I5042 Chronic combined systolic (congestive) and diastolic (congestive) heart failure: Secondary | ICD-10-CM | POA: Diagnosis not present

## 2021-07-25 DIAGNOSIS — R001 Bradycardia, unspecified: Secondary | ICD-10-CM

## 2021-07-25 DIAGNOSIS — J42 Unspecified chronic bronchitis: Secondary | ICD-10-CM

## 2021-07-25 DIAGNOSIS — R296 Repeated falls: Secondary | ICD-10-CM | POA: Diagnosis not present

## 2021-07-25 DIAGNOSIS — I4891 Unspecified atrial fibrillation: Secondary | ICD-10-CM | POA: Diagnosis not present

## 2021-07-25 DIAGNOSIS — J449 Chronic obstructive pulmonary disease, unspecified: Secondary | ICD-10-CM | POA: Diagnosis not present

## 2021-07-25 DIAGNOSIS — I4819 Other persistent atrial fibrillation: Secondary | ICD-10-CM

## 2021-07-25 NOTE — Patient Instructions (Signed)
Medication Instructions:  The current medical regimen is effective;  continue present plan and medications as directed. Please refer to the Current Medication list given to you today.   *If you need a refill on your cardiac medications before your next appointment, please call your pharmacy*  Lab Work:   Testing/Procedures:  NONE    NONE  Special Instructions PLEASE READ AND FOLLOW SALTY 6-ATTACHED-1,800mg  daily  PLEASE INCREASE PHYSICAL ACTIVITY AS TOLERATED   PLEASE MAKE SURE TO GET LOW BED AND FLOOR MATAS  PLEASE CALL IF YOU FALL WE MAY HAVE TO CHANGE YOUR ELIQUIS  Follow-Up: Your next appointment:  3-4 month(s) In Person with You may see MD IN Lorina Rabon  or one of the following Advanced Practice Providers on your designated Care Team:   Murray Hodgkins, NP Christell Faith, PA-C Cadence Kathlen Mody, PA-C  At [facility], you and your health needs are our priority.  As part of our continuing mission to provide you with exceptional heart care, we have created designated Provider Care Teams.  These Care Teams include your primary Cardiologist (physician) and Advanced Practice Providers (APPs -  Physician Assistants and Nurse Practitioners) who all work together to provide you with the care you need, when you need it.            6 SALTY THINGS TO AVOID     1,800MG  DAILY

## 2021-07-25 NOTE — Telephone Encounter (Addendum)
Anne Ng (daughter) notified that we would need a signed record release in order to get records from her Mom's previous PCP.  She will go by the [facility] office to sign a release.   Beatriz Stallion at [facility] is aware and has left records release up front.

## 2021-07-25 NOTE — Telephone Encounter (Signed)
Pt daughter called stating that pt has a new patient appointment with Dr Diona Browner on 08/30/21. Pt daughter states that she needs Dr Diona Browner to request pt medical records. Please advise.

## 2021-07-26 ENCOUNTER — Ambulatory Visit: Payer: Medicare Other | Admitting: Adult Health

## 2021-07-26 DIAGNOSIS — E559 Vitamin D deficiency, unspecified: Secondary | ICD-10-CM | POA: Diagnosis not present

## 2021-07-26 DIAGNOSIS — Z79899 Other long term (current) drug therapy: Secondary | ICD-10-CM | POA: Diagnosis not present

## 2021-07-29 DIAGNOSIS — I5042 Chronic combined systolic (congestive) and diastolic (congestive) heart failure: Secondary | ICD-10-CM | POA: Diagnosis not present

## 2021-07-29 DIAGNOSIS — I4891 Unspecified atrial fibrillation: Secondary | ICD-10-CM | POA: Diagnosis not present

## 2021-07-29 DIAGNOSIS — J449 Chronic obstructive pulmonary disease, unspecified: Secondary | ICD-10-CM | POA: Diagnosis not present

## 2021-07-29 DIAGNOSIS — I110 Hypertensive heart disease with heart failure: Secondary | ICD-10-CM | POA: Diagnosis not present

## 2021-07-29 DIAGNOSIS — N390 Urinary tract infection, site not specified: Secondary | ICD-10-CM | POA: Diagnosis not present

## 2021-07-29 DIAGNOSIS — J9621 Acute and chronic respiratory failure with hypoxia: Secondary | ICD-10-CM | POA: Diagnosis not present

## 2021-07-29 NOTE — Telephone Encounter (Signed)
Record Release faxed to Dr. Burnard Bunting at [phone].

## 2021-07-30 DIAGNOSIS — N390 Urinary tract infection, site not specified: Secondary | ICD-10-CM | POA: Diagnosis not present

## 2021-07-30 DIAGNOSIS — I110 Hypertensive heart disease with heart failure: Secondary | ICD-10-CM | POA: Diagnosis not present

## 2021-07-30 DIAGNOSIS — J9621 Acute and chronic respiratory failure with hypoxia: Secondary | ICD-10-CM | POA: Diagnosis not present

## 2021-07-30 DIAGNOSIS — I5042 Chronic combined systolic (congestive) and diastolic (congestive) heart failure: Secondary | ICD-10-CM | POA: Diagnosis not present

## 2021-07-30 DIAGNOSIS — I4891 Unspecified atrial fibrillation: Secondary | ICD-10-CM | POA: Diagnosis not present

## 2021-07-30 DIAGNOSIS — J449 Chronic obstructive pulmonary disease, unspecified: Secondary | ICD-10-CM | POA: Diagnosis not present

## 2021-08-01 DIAGNOSIS — I482 Chronic atrial fibrillation, unspecified: Secondary | ICD-10-CM | POA: Diagnosis not present

## 2021-08-01 DIAGNOSIS — R269 Unspecified abnormalities of gait and mobility: Secondary | ICD-10-CM | POA: Diagnosis not present

## 2021-08-01 DIAGNOSIS — N182 Chronic kidney disease, stage 2 (mild): Secondary | ICD-10-CM | POA: Diagnosis not present

## 2021-08-01 DIAGNOSIS — E039 Hypothyroidism, unspecified: Secondary | ICD-10-CM | POA: Diagnosis not present

## 2021-08-01 DIAGNOSIS — I130 Hypertensive heart and chronic kidney disease with heart failure and stage 1 through stage 4 chronic kidney disease, or unspecified chronic kidney disease: Secondary | ICD-10-CM | POA: Diagnosis not present

## 2021-08-01 DIAGNOSIS — J9611 Chronic respiratory failure with hypoxia: Secondary | ICD-10-CM | POA: Diagnosis not present

## 2021-08-01 DIAGNOSIS — E559 Vitamin D deficiency, unspecified: Secondary | ICD-10-CM | POA: Diagnosis not present

## 2021-08-01 DIAGNOSIS — I5042 Chronic combined systolic (congestive) and diastolic (congestive) heart failure: Secondary | ICD-10-CM | POA: Diagnosis not present

## 2021-08-01 DIAGNOSIS — R296 Repeated falls: Secondary | ICD-10-CM | POA: Diagnosis not present

## 2021-08-01 DIAGNOSIS — J449 Chronic obstructive pulmonary disease, unspecified: Secondary | ICD-10-CM | POA: Diagnosis not present

## 2021-08-01 DIAGNOSIS — D519 Vitamin B12 deficiency anemia, unspecified: Secondary | ICD-10-CM | POA: Diagnosis not present

## 2021-08-05 DIAGNOSIS — N390 Urinary tract infection, site not specified: Secondary | ICD-10-CM | POA: Diagnosis not present

## 2021-08-05 DIAGNOSIS — I110 Hypertensive heart disease with heart failure: Secondary | ICD-10-CM | POA: Diagnosis not present

## 2021-08-05 DIAGNOSIS — I5042 Chronic combined systolic (congestive) and diastolic (congestive) heart failure: Secondary | ICD-10-CM | POA: Diagnosis not present

## 2021-08-05 DIAGNOSIS — J449 Chronic obstructive pulmonary disease, unspecified: Secondary | ICD-10-CM | POA: Diagnosis not present

## 2021-08-05 DIAGNOSIS — I4891 Unspecified atrial fibrillation: Secondary | ICD-10-CM | POA: Diagnosis not present

## 2021-08-05 DIAGNOSIS — J9621 Acute and chronic respiratory failure with hypoxia: Secondary | ICD-10-CM | POA: Diagnosis not present

## 2021-08-06 DIAGNOSIS — I110 Hypertensive heart disease with heart failure: Secondary | ICD-10-CM | POA: Diagnosis not present

## 2021-08-06 DIAGNOSIS — N390 Urinary tract infection, site not specified: Secondary | ICD-10-CM | POA: Diagnosis not present

## 2021-08-06 DIAGNOSIS — J9621 Acute and chronic respiratory failure with hypoxia: Secondary | ICD-10-CM | POA: Diagnosis not present

## 2021-08-06 DIAGNOSIS — I4891 Unspecified atrial fibrillation: Secondary | ICD-10-CM | POA: Diagnosis not present

## 2021-08-06 DIAGNOSIS — I5042 Chronic combined systolic (congestive) and diastolic (congestive) heart failure: Secondary | ICD-10-CM | POA: Diagnosis not present

## 2021-08-06 DIAGNOSIS — J449 Chronic obstructive pulmonary disease, unspecified: Secondary | ICD-10-CM | POA: Diagnosis not present

## 2021-08-13 DIAGNOSIS — I5042 Chronic combined systolic (congestive) and diastolic (congestive) heart failure: Secondary | ICD-10-CM | POA: Diagnosis not present

## 2021-08-13 DIAGNOSIS — N390 Urinary tract infection, site not specified: Secondary | ICD-10-CM | POA: Diagnosis not present

## 2021-08-13 DIAGNOSIS — J449 Chronic obstructive pulmonary disease, unspecified: Secondary | ICD-10-CM | POA: Diagnosis not present

## 2021-08-13 DIAGNOSIS — I4891 Unspecified atrial fibrillation: Secondary | ICD-10-CM | POA: Diagnosis not present

## 2021-08-13 DIAGNOSIS — I110 Hypertensive heart disease with heart failure: Secondary | ICD-10-CM | POA: Diagnosis not present

## 2021-08-13 DIAGNOSIS — J9621 Acute and chronic respiratory failure with hypoxia: Secondary | ICD-10-CM | POA: Diagnosis not present

## 2021-08-14 DIAGNOSIS — I110 Hypertensive heart disease with heart failure: Secondary | ICD-10-CM | POA: Diagnosis not present

## 2021-08-14 DIAGNOSIS — N390 Urinary tract infection, site not specified: Secondary | ICD-10-CM | POA: Diagnosis not present

## 2021-08-14 DIAGNOSIS — J449 Chronic obstructive pulmonary disease, unspecified: Secondary | ICD-10-CM | POA: Diagnosis not present

## 2021-08-14 DIAGNOSIS — I4891 Unspecified atrial fibrillation: Secondary | ICD-10-CM | POA: Diagnosis not present

## 2021-08-14 DIAGNOSIS — J9621 Acute and chronic respiratory failure with hypoxia: Secondary | ICD-10-CM | POA: Diagnosis not present

## 2021-08-14 DIAGNOSIS — I5042 Chronic combined systolic (congestive) and diastolic (congestive) heart failure: Secondary | ICD-10-CM | POA: Diagnosis not present

## 2021-08-19 DIAGNOSIS — F32A Depression, unspecified: Secondary | ICD-10-CM | POA: Diagnosis not present

## 2021-08-19 DIAGNOSIS — Z9981 Dependence on supplemental oxygen: Secondary | ICD-10-CM | POA: Diagnosis not present

## 2021-08-19 DIAGNOSIS — Z85118 Personal history of other malignant neoplasm of bronchus and lung: Secondary | ICD-10-CM | POA: Diagnosis not present

## 2021-08-19 DIAGNOSIS — J9621 Acute and chronic respiratory failure with hypoxia: Secondary | ICD-10-CM | POA: Diagnosis not present

## 2021-08-19 DIAGNOSIS — I4891 Unspecified atrial fibrillation: Secondary | ICD-10-CM | POA: Diagnosis not present

## 2021-08-19 DIAGNOSIS — I110 Hypertensive heart disease with heart failure: Secondary | ICD-10-CM | POA: Diagnosis not present

## 2021-08-19 DIAGNOSIS — N390 Urinary tract infection, site not specified: Secondary | ICD-10-CM | POA: Diagnosis not present

## 2021-08-19 DIAGNOSIS — F419 Anxiety disorder, unspecified: Secondary | ICD-10-CM | POA: Diagnosis not present

## 2021-08-19 DIAGNOSIS — E039 Hypothyroidism, unspecified: Secondary | ICD-10-CM | POA: Diagnosis not present

## 2021-08-19 DIAGNOSIS — I739 Peripheral vascular disease, unspecified: Secondary | ICD-10-CM | POA: Diagnosis not present

## 2021-08-19 DIAGNOSIS — I5042 Chronic combined systolic (congestive) and diastolic (congestive) heart failure: Secondary | ICD-10-CM | POA: Diagnosis not present

## 2021-08-19 DIAGNOSIS — J449 Chronic obstructive pulmonary disease, unspecified: Secondary | ICD-10-CM | POA: Diagnosis not present

## 2021-08-19 DIAGNOSIS — E785 Hyperlipidemia, unspecified: Secondary | ICD-10-CM | POA: Diagnosis not present

## 2021-08-19 DIAGNOSIS — Z9181 History of falling: Secondary | ICD-10-CM | POA: Diagnosis not present

## 2021-08-19 DIAGNOSIS — Z7901 Long term (current) use of anticoagulants: Secondary | ICD-10-CM | POA: Diagnosis not present

## 2021-08-19 DIAGNOSIS — I4892 Unspecified atrial flutter: Secondary | ICD-10-CM | POA: Diagnosis not present

## 2021-08-21 DIAGNOSIS — I4891 Unspecified atrial fibrillation: Secondary | ICD-10-CM | POA: Diagnosis not present

## 2021-08-21 DIAGNOSIS — N390 Urinary tract infection, site not specified: Secondary | ICD-10-CM | POA: Diagnosis not present

## 2021-08-21 DIAGNOSIS — J9621 Acute and chronic respiratory failure with hypoxia: Secondary | ICD-10-CM | POA: Diagnosis not present

## 2021-08-21 DIAGNOSIS — I5042 Chronic combined systolic (congestive) and diastolic (congestive) heart failure: Secondary | ICD-10-CM | POA: Diagnosis not present

## 2021-08-21 DIAGNOSIS — I110 Hypertensive heart disease with heart failure: Secondary | ICD-10-CM | POA: Diagnosis not present

## 2021-08-21 DIAGNOSIS — J449 Chronic obstructive pulmonary disease, unspecified: Secondary | ICD-10-CM | POA: Diagnosis not present

## 2021-08-22 DIAGNOSIS — N390 Urinary tract infection, site not specified: Secondary | ICD-10-CM | POA: Diagnosis not present

## 2021-08-22 DIAGNOSIS — I110 Hypertensive heart disease with heart failure: Secondary | ICD-10-CM | POA: Diagnosis not present

## 2021-08-22 DIAGNOSIS — I4891 Unspecified atrial fibrillation: Secondary | ICD-10-CM | POA: Diagnosis not present

## 2021-08-22 DIAGNOSIS — J9621 Acute and chronic respiratory failure with hypoxia: Secondary | ICD-10-CM | POA: Diagnosis not present

## 2021-08-22 DIAGNOSIS — I5042 Chronic combined systolic (congestive) and diastolic (congestive) heart failure: Secondary | ICD-10-CM | POA: Diagnosis not present

## 2021-08-22 DIAGNOSIS — J449 Chronic obstructive pulmonary disease, unspecified: Secondary | ICD-10-CM | POA: Diagnosis not present

## 2021-08-27 DIAGNOSIS — N390 Urinary tract infection, site not specified: Secondary | ICD-10-CM | POA: Diagnosis not present

## 2021-08-27 DIAGNOSIS — I110 Hypertensive heart disease with heart failure: Secondary | ICD-10-CM | POA: Diagnosis not present

## 2021-08-27 DIAGNOSIS — J9621 Acute and chronic respiratory failure with hypoxia: Secondary | ICD-10-CM | POA: Diagnosis not present

## 2021-08-27 DIAGNOSIS — I4891 Unspecified atrial fibrillation: Secondary | ICD-10-CM | POA: Diagnosis not present

## 2021-08-27 DIAGNOSIS — J449 Chronic obstructive pulmonary disease, unspecified: Secondary | ICD-10-CM | POA: Diagnosis not present

## 2021-08-27 DIAGNOSIS — I5042 Chronic combined systolic (congestive) and diastolic (congestive) heart failure: Secondary | ICD-10-CM | POA: Diagnosis not present

## 2021-08-27 DIAGNOSIS — R300 Dysuria: Secondary | ICD-10-CM | POA: Diagnosis not present

## 2021-08-27 DIAGNOSIS — Z79899 Other long term (current) drug therapy: Secondary | ICD-10-CM | POA: Diagnosis not present

## 2021-08-28 DIAGNOSIS — J449 Chronic obstructive pulmonary disease, unspecified: Secondary | ICD-10-CM | POA: Diagnosis not present

## 2021-08-28 DIAGNOSIS — I5042 Chronic combined systolic (congestive) and diastolic (congestive) heart failure: Secondary | ICD-10-CM | POA: Diagnosis not present

## 2021-08-28 DIAGNOSIS — I110 Hypertensive heart disease with heart failure: Secondary | ICD-10-CM | POA: Diagnosis not present

## 2021-08-28 DIAGNOSIS — J9621 Acute and chronic respiratory failure with hypoxia: Secondary | ICD-10-CM | POA: Diagnosis not present

## 2021-08-28 DIAGNOSIS — N390 Urinary tract infection, site not specified: Secondary | ICD-10-CM | POA: Diagnosis not present

## 2021-08-28 DIAGNOSIS — I4891 Unspecified atrial fibrillation: Secondary | ICD-10-CM | POA: Diagnosis not present

## 2021-08-29 DIAGNOSIS — N182 Chronic kidney disease, stage 2 (mild): Secondary | ICD-10-CM | POA: Diagnosis not present

## 2021-08-29 DIAGNOSIS — E441 Mild protein-calorie malnutrition: Secondary | ICD-10-CM | POA: Diagnosis not present

## 2021-08-29 DIAGNOSIS — R269 Unspecified abnormalities of gait and mobility: Secondary | ICD-10-CM | POA: Diagnosis not present

## 2021-08-29 DIAGNOSIS — I130 Hypertensive heart and chronic kidney disease with heart failure and stage 1 through stage 4 chronic kidney disease, or unspecified chronic kidney disease: Secondary | ICD-10-CM | POA: Diagnosis not present

## 2021-08-29 DIAGNOSIS — F330 Major depressive disorder, recurrent, mild: Secondary | ICD-10-CM | POA: Diagnosis not present

## 2021-08-29 DIAGNOSIS — I5042 Chronic combined systolic (congestive) and diastolic (congestive) heart failure: Secondary | ICD-10-CM | POA: Diagnosis not present

## 2021-08-29 DIAGNOSIS — J449 Chronic obstructive pulmonary disease, unspecified: Secondary | ICD-10-CM | POA: Diagnosis not present

## 2021-08-29 DIAGNOSIS — R399 Unspecified symptoms and signs involving the genitourinary system: Secondary | ICD-10-CM | POA: Diagnosis not present

## 2021-08-29 DIAGNOSIS — J9611 Chronic respiratory failure with hypoxia: Secondary | ICD-10-CM | POA: Diagnosis not present

## 2021-08-30 ENCOUNTER — Ambulatory Visit (INDEPENDENT_AMBULATORY_CARE_PROVIDER_SITE_OTHER): Payer: Medicare Other | Admitting: Family Medicine

## 2021-08-30 ENCOUNTER — Other Ambulatory Visit: Payer: Self-pay

## 2021-08-30 ENCOUNTER — Encounter: Payer: Self-pay | Admitting: Family Medicine

## 2021-08-30 VITALS — BP 116/70 | HR 123 | Ht 64.0 in | Wt 195.0 lb

## 2021-08-30 DIAGNOSIS — I712 Thoracic aortic aneurysm, without rupture, unspecified: Secondary | ICD-10-CM

## 2021-08-30 DIAGNOSIS — Z86718 Personal history of other venous thrombosis and embolism: Secondary | ICD-10-CM | POA: Diagnosis not present

## 2021-08-30 DIAGNOSIS — N390 Urinary tract infection, site not specified: Secondary | ICD-10-CM | POA: Diagnosis not present

## 2021-08-30 DIAGNOSIS — I5022 Chronic systolic (congestive) heart failure: Secondary | ICD-10-CM

## 2021-08-30 DIAGNOSIS — I714 Abdominal aortic aneurysm, without rupture, unspecified: Secondary | ICD-10-CM | POA: Diagnosis not present

## 2021-08-30 DIAGNOSIS — Z85118 Personal history of other malignant neoplasm of bronchus and lung: Secondary | ICD-10-CM

## 2021-08-30 DIAGNOSIS — G4733 Obstructive sleep apnea (adult) (pediatric): Secondary | ICD-10-CM

## 2021-08-30 DIAGNOSIS — E039 Hypothyroidism, unspecified: Secondary | ICD-10-CM | POA: Diagnosis not present

## 2021-08-30 DIAGNOSIS — E782 Mixed hyperlipidemia: Secondary | ICD-10-CM | POA: Diagnosis not present

## 2021-08-30 DIAGNOSIS — M19012 Primary osteoarthritis, left shoulder: Secondary | ICD-10-CM

## 2021-08-30 DIAGNOSIS — I4891 Unspecified atrial fibrillation: Secondary | ICD-10-CM

## 2021-08-30 DIAGNOSIS — I10 Essential (primary) hypertension: Secondary | ICD-10-CM | POA: Diagnosis not present

## 2021-08-30 DIAGNOSIS — C3490 Malignant neoplasm of unspecified part of unspecified bronchus or lung: Secondary | ICD-10-CM | POA: Diagnosis not present

## 2021-08-30 DIAGNOSIS — J9611 Chronic respiratory failure with hypoxia: Secondary | ICD-10-CM

## 2021-08-30 DIAGNOSIS — J439 Emphysema, unspecified: Secondary | ICD-10-CM

## 2021-08-30 NOTE — Assessment & Plan Note (Signed)
Intolerant of CPAP. ?

## 2021-08-30 NOTE — Assessment & Plan Note (Signed)
°  On anticoagulant Eliquis

## 2021-08-30 NOTE — Assessment & Plan Note (Signed)
Has not been checked in a while... was on med years ago. ? ?

## 2021-08-30 NOTE — Progress Notes (Incomplete)
Patient ID: [name], female    DOB: 03/29/1941, 81 y.o.   MRN: [id number]  This visit was conducted in person.  BP 116/70    Pulse (!) 123    Ht 5\' 4"  (1.626 m)    Wt 195 lb (88.5 kg)    SpO2 93%    BMI 33.47 kg/m    CC:  Chief Complaint  Patient presents with   New Patient (Initial Visit)    Est care , new pt , having shortness of breath, need a referral to for breathing .    Subjective:   HPI: [name] is a 81 y.o. female presenting on 08/30/2021 for New Patient (Initial Visit) (Est care , new pt , having shortness of breath, need a referral to for breathing .)  Previous PCP Dr. Reynaldo Minium.Marland Kitchen last OV with NP  at that office 07/25/21 Following up on bradycardia and chronic combined systolic and diastolic heart failure.  Last AMW/CPX: over 1 year ago.   Moved into assisted living last July and heath has been more stable since.    CHF, systolic and diastolic : Continued metoprolol , furosemide and digoxin.  She states her breathing has been stable since this last OV. Swelling is improving overtime Cardiology  follows afib with RVR  On anticoagulant Eliquis Planning changing to [facility] to be closer.. next appt is    COPD, history of lung cancer ( per notes no recurrence sine 2013) On continuous oxygen.nPulmonary:  Dr. Annamaria Boots .. cannot get to [facility]  as easily   AAA:  S/p surgery, als has a thoracic aneurysm... needs referral to new vascular MD.   HTN:  BP Readings from Last 3 Encounters:  08/30/21 116/70  07/25/21 112/64  07/16/21 (!) 112/58    Left shoulder OA: followed by Dr. Nelva Bush at [facility].. plans to go locally.   Not a surgical candidate Treated with Morphine  Relevant past medical, surgical, family and social history reviewed and updated as indicated. Interim medical history since our last visit reviewed. Allergies and medications reviewed and updated. Outpatient Medications Prior to Visit  Medication Sig Dispense Refill    acetaminophen (TYLENOL) 500 MG tablet Take 2 tablets (1,000 mg total) by mouth every 6 (six) hours as needed for fever, headache, mild pain or moderate pain (backpain). 30 tablet 0   albuterol (VENTOLIN HFA) 108 (90 Base) MCG/ACT inhaler TAKE 2 PUFFS EVERY 6 HOURS AS NEEDED FORSHORTNESS OF BREATH/WHEEZING 54 g 4   apixaban (ELIQUIS) 5 MG TABS tablet Take 1 tablet (5 mg total) by mouth 2 (two) times daily. 60 tablet    Cholecalciferol (VITAMIN D) 1000 UNITS capsule Take 1,000 Units by mouth daily.     citalopram (CELEXA) 20 MG tablet Take 20 mg by mouth daily.     digoxin (LANOXIN) 0.125 MG tablet Take 1 tablet (125 mcg total) by mouth daily. 90 tablet 3   furosemide (LASIX) 40 MG tablet Take 2 tablets ( 80 mg ) daily 90 tablet 3   gabapentin (NEURONTIN) 100 MG capsule Take 1 capsule by mouth 3 (three) times daily.     levothyroxine (SYNTHROID, LEVOTHROID) 88 MCG tablet Take 88 mcg by mouth daily.     megestrol (MEGACE) 40 MG tablet Take 40 mg by mouth 2 (two) times daily.     metoprolol tartrate (LOPRESSOR) 25 MG tablet TAKE 1/2 TABLET TWICE DAILY (Patient taking differently: Take 12.5 mg by mouth 2 (two) times daily.) 90 tablet 3   morphine (MSIR)  15 MG tablet Take 0.5 tablets (7.5 mg total) by mouth every 6 (six) hours as needed. 10 tablet 0   OXYGEN Inhale 4 L into the lungs daily. Patient states she wears 2L at night and 4L when up and moving     potassium chloride SA (KLOR-CON M) 20 MEQ tablet Take 20 mEq by mouth 2 (two) times daily.     tiotropium (SPIRIVA HANDIHALER) 18 MCG inhalation capsule Place 1 capsule (18 mcg total) into inhaler and inhale daily. 90 capsule 4   vitamin B-12 (CYANOCOBALAMIN) 100 MCG tablet Take 100 mcg by mouth daily.     pantoprazole (PROTONIX) 40 MG tablet Take 1 tablet (40 mg total) by mouth daily. 30 tablet 0   No facility-administered medications prior to visit.     Per HPI unless specifically indicated in ROS section below Review of  Systems Objective:  BP 116/70    Pulse (!) 123    Ht 5\' 4"  (1.626 m)    Wt 195 lb (88.5 kg)    SpO2 93%    BMI 33.47 kg/m   Wt Readings from Last 3 Encounters:  08/30/21 195 lb (88.5 kg)  07/25/21 194 lb 6.4 oz (88.2 kg)  07/15/21 193 lb 5.5 oz (87.7 kg)      Physical Exam    Results for orders placed or performed during the hospital encounter of 07/15/21  Resp Panel by RT-PCR (Flu A&B, Covid) Nasopharyngeal Swab   Specimen: Nasopharyngeal Swab; Nasopharyngeal(NP) swabs in vial transport medium  Result Value Ref Range   SARS Coronavirus 2 by RT PCR NEGATIVE NEGATIVE   Influenza A by PCR NEGATIVE NEGATIVE   Influenza B by PCR NEGATIVE NEGATIVE  Urine Culture   Specimen: Urine, Random  Result Value Ref Range   Specimen Description      URINE, RANDOM Performed at [facility], [address] [place], [place] [address]    Special Requests      NONE Performed at [facility], [address] [place], [place] [address]    Culture >=100,000 COLONIES/mL KLEBSIELLA PNEUMONIAE (A)    Report Status 07/17/2021 FINAL    Organism ID, Bacteria KLEBSIELLA PNEUMONIAE (A)       Susceptibility   Klebsiella pneumoniae - MIC*    AMPICILLIN RESISTANT Resistant     CEFAZOLIN <=4 SENSITIVE Sensitive     CEFEPIME <=0.12 SENSITIVE Sensitive     CEFTRIAXONE <=0.25 SENSITIVE Sensitive     CIPROFLOXACIN <=0.25 SENSITIVE Sensitive     GENTAMICIN <=1 SENSITIVE Sensitive     IMIPENEM <=0.25 SENSITIVE Sensitive     NITROFURANTOIN 64 INTERMEDIATE Intermediate     TRIMETH/SULFA <=20 SENSITIVE Sensitive     AMPICILLIN/SULBACTAM 4 SENSITIVE Sensitive     PIP/TAZO <=4 SENSITIVE Sensitive     * >=100,000 COLONIES/mL KLEBSIELLA PNEUMONIAE  Digoxin level  Result Value Ref Range   Digoxin Level 0.8 0.8 - 2.0 ng/mL  CBC with Differential  Result Value Ref Range   WBC 6.1 4.0 - 10.5 K/uL   RBC 4.69 3.87 - 5.11 MIL/uL   Hemoglobin 16.1 (H) 12.0 - 15.0 g/dL   HCT 48.7 (H) 36.0 - 46.0 %    MCV 103.8 (H) 80.0 - 100.0 fL   MCH 34.3 (H) 26.0 - 34.0 pg   MCHC 33.1 30.0 - 36.0 g/dL   RDW 14.2 11.5 - 15.5 %   Platelets 153 150 - 400 K/uL   nRBC 0.0 0.0 - 0.2 %   Neutrophils Relative % 69 %  Neutro Abs 4.1 1.7 - 7.7 K/uL   Lymphocytes Relative 23 %   Lymphs Abs 1.4 0.7 - 4.0 K/uL   Monocytes Relative 7 %   Monocytes Absolute 0.5 0.1 - 1.0 K/uL   Eosinophils Relative 1 %   Eosinophils Absolute 0.1 0.0 - 0.5 K/uL   Basophils Relative 0 %   Basophils Absolute 0.0 0.0 - 0.1 K/uL   Immature Granulocytes 0 %   Abs Immature Granulocytes 0.02 0.00 - 0.07 K/uL  Comprehensive metabolic panel  Result Value Ref Range   Sodium 137 135 - 145 mmol/L   Potassium 4.0 3.5 - 5.1 mmol/L   Chloride 102 98 - 111 mmol/L   CO2 26 22 - 32 mmol/L   Glucose, Bld 153 (H) 70 - 99 mg/dL   BUN 17 8 - 23 mg/dL   Creatinine, Ser 1.07 (H) 0.44 - 1.00 mg/dL   Calcium 9.1 8.9 - 10.3 mg/dL   Total Protein 7.5 6.5 - 8.1 g/dL   Albumin 3.8 3.5 - 5.0 g/dL   AST 27 15 - 41 U/L   ALT 18 0 - 44 U/L   Alkaline Phosphatase 56 38 - 126 U/L   Total Bilirubin 1.4 (H) 0.3 - 1.2 mg/dL   GFR, Estimated 52 (L) >60 mL/min   Anion gap 9 5 - 15  Urinalysis, Routine w reflex microscopic  Result Value Ref Range   Color, Urine YELLOW (A) YELLOW   APPearance HAZY (A) CLEAR   Specific Gravity, Urine 1.012 1.005 - 1.030   pH 5.0 5.0 - 8.0   Glucose, UA NEGATIVE NEGATIVE mg/dL   Hgb urine dipstick MODERATE (A) NEGATIVE   Bilirubin Urine NEGATIVE NEGATIVE   Ketones, ur NEGATIVE NEGATIVE mg/dL   Protein, ur NEGATIVE NEGATIVE mg/dL   Nitrite POSITIVE (A) NEGATIVE   Leukocytes,Ua TRACE (A) NEGATIVE   RBC / HPF 0-5 0 - 5 RBC/hpf   WBC, UA 6-10 0 - 5 WBC/hpf   Bacteria, UA MANY (A) NONE SEEN   Squamous Epithelial / LPF 0-5 0 - 5   Mucus PRESENT    Hyaline Casts, UA PRESENT   TSH  Result Value Ref Range   TSH 3.600 0.350 - 4.500 uIU/mL  CBC  Result Value Ref Range   WBC 4.9 4.0 - 10.5 K/uL   RBC 4.21 3.87 - 5.11  MIL/uL   Hemoglobin 14.3 12.0 - 15.0 g/dL   HCT 42.9 36.0 - 46.0 %   MCV 101.9 (H) 80.0 - 100.0 fL   MCH 34.0 26.0 - 34.0 pg   MCHC 33.3 30.0 - 36.0 g/dL   RDW 14.1 11.5 - 15.5 %   Platelets 129 (L) 150 - 400 K/uL   nRBC 0.0 0.0 - 0.2 %  Basic metabolic panel  Result Value Ref Range   Sodium 137 135 - 145 mmol/L   Potassium 3.6 3.5 - 5.1 mmol/L   Chloride 102 98 - 111 mmol/L   CO2 28 22 - 32 mmol/L   Glucose, Bld 107 (H) 70 - 99 mg/dL   BUN 13 8 - 23 mg/dL   Creatinine, Ser 0.77 0.44 - 1.00 mg/dL   Calcium 8.6 (L) 8.9 - 10.3 mg/dL   GFR, Estimated >60 >60 mL/min   Anion gap 7 5 - 15  Troponin I (High Sensitivity)  Result Value Ref Range   Troponin I (High Sensitivity) 22 (H) <18 ng/L  Troponin I (High Sensitivity)  Result Value Ref Range   Troponin I (High Sensitivity) 21 (H) <18  ng/L    This visit occurred during the SARS-CoV-2 public health emergency.  Safety protocols were in place, including screening questions prior to the visit, additional usage of staff PPE, and extensive cleaning of exam room while observing appropriate contact time as indicated for disinfecting solutions.   COVID 19 screen:  No recent travel or known exposure to COVID19 The patient denies respiratory symptoms of COVID 19 at this time. The importance of social distancing was discussed today.   Assessment and Plan     Eliezer Lofts, MD

## 2021-08-30 NOTE — Assessment & Plan Note (Signed)
On oxygen 4 liters continuously  ?

## 2021-08-30 NOTE — Assessment & Plan Note (Signed)
Stable, chronic.  Continue current medication. ? ? ?Levothyroxine 88 mcg daily ?

## 2021-08-30 NOTE — Assessment & Plan Note (Signed)
Managed by [facility] ? On MSIR 0.5 mg 15 mg BID prn. ?

## 2021-08-30 NOTE — Assessment & Plan Note (Signed)
Chronic  On Spiriva and using albuterol as needed

## 2021-08-30 NOTE — Patient Instructions (Signed)
We will work on referrals to pulmonary and  vascular. ? Call if cardiology and ortho referrals not moving forward. ?

## 2021-08-30 NOTE — Assessment & Plan Note (Signed)
Hx of repair. ?Need referral to new Vascular MD for management. ?

## 2021-08-30 NOTE — Assessment & Plan Note (Signed)
Pending UA per [facility].. no current symptoms.  ? ?

## 2021-08-30 NOTE — Assessment & Plan Note (Signed)
Occurred when diagnosed with cancer ? On Eliquis anticoagulation. ?

## 2021-08-30 NOTE — Assessment & Plan Note (Signed)
Stable, chronic.  Continue current medication.    

## 2021-09-02 ENCOUNTER — Ambulatory Visit: Payer: Medicare Other | Admitting: Cardiology

## 2021-09-03 DIAGNOSIS — J9621 Acute and chronic respiratory failure with hypoxia: Secondary | ICD-10-CM | POA: Diagnosis not present

## 2021-09-03 DIAGNOSIS — I5042 Chronic combined systolic (congestive) and diastolic (congestive) heart failure: Secondary | ICD-10-CM | POA: Diagnosis not present

## 2021-09-03 DIAGNOSIS — N390 Urinary tract infection, site not specified: Secondary | ICD-10-CM | POA: Diagnosis not present

## 2021-09-03 DIAGNOSIS — J449 Chronic obstructive pulmonary disease, unspecified: Secondary | ICD-10-CM | POA: Diagnosis not present

## 2021-09-03 DIAGNOSIS — I110 Hypertensive heart disease with heart failure: Secondary | ICD-10-CM | POA: Diagnosis not present

## 2021-09-03 DIAGNOSIS — I4891 Unspecified atrial fibrillation: Secondary | ICD-10-CM | POA: Diagnosis not present

## 2021-09-10 DIAGNOSIS — Z7901 Long term (current) use of anticoagulants: Secondary | ICD-10-CM | POA: Diagnosis not present

## 2021-09-10 DIAGNOSIS — J9621 Acute and chronic respiratory failure with hypoxia: Secondary | ICD-10-CM | POA: Diagnosis not present

## 2021-09-10 DIAGNOSIS — I4891 Unspecified atrial fibrillation: Secondary | ICD-10-CM | POA: Diagnosis not present

## 2021-09-10 DIAGNOSIS — N1831 Chronic kidney disease, stage 3a: Secondary | ICD-10-CM | POA: Diagnosis not present

## 2021-09-10 DIAGNOSIS — E038 Other specified hypothyroidism: Secondary | ICD-10-CM | POA: Diagnosis not present

## 2021-09-10 DIAGNOSIS — J449 Chronic obstructive pulmonary disease, unspecified: Secondary | ICD-10-CM | POA: Diagnosis not present

## 2021-09-10 DIAGNOSIS — Z6831 Body mass index (BMI) 31.0-31.9, adult: Secondary | ICD-10-CM | POA: Diagnosis not present

## 2021-09-10 DIAGNOSIS — I110 Hypertensive heart disease with heart failure: Secondary | ICD-10-CM | POA: Diagnosis not present

## 2021-09-10 DIAGNOSIS — I482 Chronic atrial fibrillation, unspecified: Secondary | ICD-10-CM | POA: Diagnosis not present

## 2021-09-10 DIAGNOSIS — I130 Hypertensive heart and chronic kidney disease with heart failure and stage 1 through stage 4 chronic kidney disease, or unspecified chronic kidney disease: Secondary | ICD-10-CM | POA: Diagnosis not present

## 2021-09-10 DIAGNOSIS — I5042 Chronic combined systolic (congestive) and diastolic (congestive) heart failure: Secondary | ICD-10-CM | POA: Diagnosis not present

## 2021-09-10 DIAGNOSIS — N390 Urinary tract infection, site not specified: Secondary | ICD-10-CM | POA: Diagnosis not present

## 2021-09-26 DIAGNOSIS — J449 Chronic obstructive pulmonary disease, unspecified: Secondary | ICD-10-CM | POA: Diagnosis not present

## 2021-09-26 DIAGNOSIS — E441 Mild protein-calorie malnutrition: Secondary | ICD-10-CM | POA: Diagnosis not present

## 2021-09-26 DIAGNOSIS — I130 Hypertensive heart and chronic kidney disease with heart failure and stage 1 through stage 4 chronic kidney disease, or unspecified chronic kidney disease: Secondary | ICD-10-CM | POA: Diagnosis not present

## 2021-09-26 DIAGNOSIS — R269 Unspecified abnormalities of gait and mobility: Secondary | ICD-10-CM | POA: Diagnosis not present

## 2021-09-26 DIAGNOSIS — N182 Chronic kidney disease, stage 2 (mild): Secondary | ICD-10-CM | POA: Diagnosis not present

## 2021-09-26 NOTE — Assessment & Plan Note (Signed)
Chronic, followed by cardiology. ?Continue metoprolol , furosemide and digoxin. ?

## 2021-09-26 NOTE — Assessment & Plan Note (Signed)
No active treatment.  

## 2021-10-01 ENCOUNTER — Institutional Professional Consult (permissible substitution): Payer: Medicare Other | Admitting: Pulmonary Disease

## 2021-10-22 ENCOUNTER — Ambulatory Visit: Payer: Medicare Other | Admitting: Medical

## 2021-10-24 DIAGNOSIS — I5042 Chronic combined systolic (congestive) and diastolic (congestive) heart failure: Secondary | ICD-10-CM | POA: Diagnosis not present

## 2021-10-24 DIAGNOSIS — R269 Unspecified abnormalities of gait and mobility: Secondary | ICD-10-CM | POA: Diagnosis not present

## 2021-10-24 DIAGNOSIS — E441 Mild protein-calorie malnutrition: Secondary | ICD-10-CM | POA: Diagnosis not present

## 2021-10-24 DIAGNOSIS — N182 Chronic kidney disease, stage 2 (mild): Secondary | ICD-10-CM | POA: Diagnosis not present

## 2021-10-24 DIAGNOSIS — J449 Chronic obstructive pulmonary disease, unspecified: Secondary | ICD-10-CM | POA: Diagnosis not present

## 2021-10-24 DIAGNOSIS — I130 Hypertensive heart and chronic kidney disease with heart failure and stage 1 through stage 4 chronic kidney disease, or unspecified chronic kidney disease: Secondary | ICD-10-CM | POA: Diagnosis not present

## 2021-10-25 ENCOUNTER — Ambulatory Visit: Payer: Medicare Other | Admitting: Medical

## 2021-10-25 DIAGNOSIS — Z79899 Other long term (current) drug therapy: Secondary | ICD-10-CM | POA: Diagnosis not present

## 2021-10-28 NOTE — Progress Notes (Signed)
? ?Cardiology Office Note   ? ?Date:  10/29/2021  ? ?ID:  [name], DOB 1940/10/07, MRN [id number] ? ?PCP:  Jinny Sanders, MD  ?Cardiologist:  Peter Martinique, MD  ?Electrophysiologist:  None  ? ?Chief Complaint: Establish care in the [place] office ? ?History of Present Illness:  ? ?[name] is a 81 y.o. female with history of chronic combined systolic and diastolic CHF, permanent A-fib on apixaban, HTN, HLD, PAD, non-small cell lung cancer with metastasis to the supraclavicular and mediastinal lymph nodes diagnosed in 2004, bilateral lower extremity DVT, hypothyroidism, chronic hypoxic respiratory failure on home O2, COPD, and AAA status post aorto-biiliac stent graft, and descending thoracic aortic aneurysm measuring 3.3 cm followed by vascular surgery in [facility] who presents for follow-up of cardiomyopathy and A-fib. ? ?She was admitted to the hospital in 2018 with rapid A-fib and CHF.  She was managed with rate control and diuresis as she was not felt to be a candidate for amiodarone with underlying pulmonary disease, Tikosyn and sotalol were contraindicated secondary to prolonged QT, and Multaq and flecainide were not good choices with underlying CHF.  She has been followed historically in our [place] office by Dr. Martinique and Coletta Memos, NP with plans for her to transition her care to our [facility] office given the patient now resides at [facility].   ? ?She was admitted to the hospital in 04/2021 with sepsis secondary to ESBL UTI with admission complicated by acute metabolic encephalopathy, acute on chronic hypoxic respiratory failure, and A-fib with RVR.  She was readmitted in 05/2021 with acute on chronic hypoxic respiratory failure secondary to COPD with admission notable for acute metabolic encephalopathy and A-fib with RVR with recommendation to continue rate control strategy.  Echo during that admission demonstrated an EF of 35 to 40%, global hypokinesis, mild LVH,  moderately reduced RV systolic function with normal ventricular cavity size, moderately elevated PASP estimated at 47.9 mmHg, moderate biatrial enlargement, mild mitral valve regurgitation with severe mitral annular calcification, moderate tricuspid regurgitation, and aortic valve sclerosis without evidence of stenosis.  EF was unchanged when compared to prior echo dating back to 2018.  She was most recently admitted to the hospital in 06/2021 after sustaining multiple falls out of the bed. ? ?She comes in accompanied by one of her daughters today and is doing reasonably well from a cardiac perspective.  She continues to note chronic stable fatigue and dyspnea.  She notes her dyspnea is improved when using her nebulizer.  She remains on supplemental oxygen at 4 L, which is stable for her.  No symptoms of angina, palpitations, dizziness, presyncope, or syncope.  Her lower extremity swelling is somewhat improved.  No falls, hematochezia, or melena.  Her weight has been stable.  She is tolerating cardiac medications without issues. ? ? ?Labs independently reviewed: ?06/2021 - potassium 3.6, BUN 13, serum creatinine 0.77, Hgb 14.3, PLT 129, TSH normal, albumin 3.8, AST/ALT normal, digoxin 0.8, magnesium 2.0 ? ?Past Medical History:  ?Diagnosis Date  ? AAA (abdominal aortic aneurysm) ([place])   ? Anticoagulant long-term use   ? Failed on Coumadin. On Xarelto  ? Anxiety   ? Anxiety and depression   ? Atrial fib/flutter, transient June 2012  ? Chronic lower back pain   ? Chronic respiratory failure with hypoxia ([place]) 12/03/2014  ? COPD (chronic obstructive pulmonary disease) ([place])   ? DVT (deep venous thrombosis) ([place]) 2004  ? BLE  ? Esophageal dysmotility   ? Exertional  dyspnea   ? Hiatal hernia   ? History of bronchitis   ? History of fibrocystic disease of breast   ? History of uterine fibroid   ? HTN (hypertension)   ? Hyperlipidemia   ? Hypothyroidism   ? Normal nuclear stress test 2012  ? May 2012  ? OA (osteoarthritis)    ? "knees; left shoulder"  ? OSA (obstructive sleep apnea)   ? "haven't been using my CPAP lately" (01/21/12)  ? Ovarian mass   ? right benign  ? Ovarian mass   ? benign, right  ? PAD (peripheral artery disease) ([place])   ? Small cell carcinoma of lung ([place]) 2004  ? NON-SMALL CELL CARCINOMA OF THE LUNG, METASTATIC TO THE SUPRACLAVICULAR AND MEDIASTINAL LYMPH NODES; in remission  ? ? ?Past Surgical History:  ?Procedure Laterality Date  ? ABDOMINAL AORTIC ANEURYSM REPAIR  ~ 2010  ? stent graft  ? BLADDER SURGERY  ~ 2003  ? sling  ? BREAST BIOPSY  1960's  ? both breast's - benign  ? CARDIOVASCULAR STRESS TEST  2012  ? No ischemia  ? CATARACT EXTRACTION W/ INTRAOCULAR LENS  IMPLANT, BILATERAL Bilateral ~ 2009  ? REPLACEMENT TOTAL KNEE Left ~ 2008  ? left  ? THYROIDECTOMY  ~ 1964  ? ? ?Current Medications: ?Current Meds  ?Medication Sig  ? acetaminophen (TYLENOL) 500 MG tablet Take 2 tablets (1,000 mg total) by mouth every 6 (six) hours as needed for fever, headache, mild pain or moderate pain (backpain).  ? albuterol (VENTOLIN HFA) 108 (90 Base) MCG/ACT inhaler TAKE 2 PUFFS EVERY 6 HOURS AS NEEDED FORSHORTNESS OF BREATH/WHEEZING  ? apixaban (ELIQUIS) 5 MG TABS tablet Take 1 tablet (5 mg total) by mouth 2 (two) times daily.  ? Cholecalciferol (VITAMIN D) 1000 UNITS capsule Take 1,000 Units by mouth daily.  ? citalopram (CELEXA) 20 MG tablet Take 20 mg by mouth daily.  ? digoxin (LANOXIN) 0.125 MG tablet Take 1 tablet (125 mcg total) by mouth daily.  ? furosemide (LASIX) 40 MG tablet Take 2 tablets ( 80 mg ) daily  ? gabapentin (NEURONTIN) 100 MG capsule Take 1 capsule by mouth 3 (three) times daily.  ? ipratropium-albuterol (DUONEB) 0.5-2.5 (3) MG/3ML SOLN Take 3 mLs by nebulization 2 (two) times daily.  ? levalbuterol (XOPENEX HFA) 45 MCG/ACT inhaler Inhale 2 puffs into the lungs every 8 (eight) hours as needed.  ? levothyroxine (SYNTHROID, LEVOTHROID) 88 MCG tablet Take 88 mcg by mouth daily.  ? melatonin 5 MG TABS Take 5  mg by mouth at bedtime.  ? metoprolol tartrate (LOPRESSOR) 25 MG tablet TAKE 1/2 TABLET TWICE DAILY  ? morphine (MSIR) 15 MG tablet Take 0.5 tablets (7.5 mg total) by mouth every 6 (six) hours as needed.  ? OXYGEN Inhale 4 L into the lungs daily. Patient states she wears 2L at night and 4L when up and moving  ? pantoprazole (PROTONIX) 40 MG tablet Take 1 tablet (40 mg total) by mouth daily.  ? potassium chloride SA (KLOR-CON M) 20 MEQ tablet Take 20 mEq by mouth 2 (two) times daily.  ? tiotropium (SPIRIVA HANDIHALER) 18 MCG inhalation capsule Place 1 capsule (18 mcg total) into inhaler and inhale daily.  ? vitamin B-12 (CYANOCOBALAMIN) 100 MCG tablet Take 100 mcg by mouth daily.  ? ? ?Allergies:   Patient has no known allergies.  ? ?Social History  ? ?Socioeconomic History  ? Marital status: Divorced  ?  Spouse name: Not on file  ? Number  of children: 2  ? Years of education: Not on file  ? Highest education level: Not on file  ?Occupational History  ? Occupation: retired  ?  Comment: Chiropractor  ?Tobacco Use  ? Smoking status: Former  ?  Packs/day: 1.00  ?  Years: 40.00  ?  Pack years: 40.00  ?  Types: Cigarettes  ?  Quit date: 06/23/2001  ?  Years since quitting: 20.3  ? Smokeless tobacco: Never  ?Vaping Use  ? Vaping Use: Never used  ?Substance and Sexual Activity  ? Alcohol use: Not Currently  ?  Comment: once a week (glass of wine)  ? Drug use: No  ? Sexual activity: Never  ?Other Topics Concern  ? Not on file  ?Social History Narrative  ? Not on file  ? ?Social Determinants of Health  ? ?Financial Resource Strain: Not on file  ?Food Insecurity: Not on file  ?Transportation Needs: Not on file  ?Physical Activity: Not on file  ?Stress: Not on file  ?Social Connections: Not on file  ?  ? ?Family History:  ?The patient's family history includes Diabetes in her mother; Diabetes type II in her mother; Heart attack in her father; Heart disease in her brother, father, and sister; Hypertension in her mother. There  is no history of Breast cancer. ? ?ROS:   ?12-point review of systems is negative unless otherwise noted in the HPI. ? ? ?EKGs/Labs/Other Studies Reviewed:   ? ?Studies reviewed were summarized above.

## 2021-10-29 ENCOUNTER — Telehealth: Payer: Self-pay | Admitting: Physician Assistant

## 2021-10-29 ENCOUNTER — Ambulatory Visit (INDEPENDENT_AMBULATORY_CARE_PROVIDER_SITE_OTHER): Payer: Medicare Other | Admitting: Physician Assistant

## 2021-10-29 ENCOUNTER — Other Ambulatory Visit
Admission: RE | Admit: 2021-10-29 | Discharge: 2021-10-29 | Disposition: A | Discharge status: Home / Self Care | Payer: Medicare Other | Attending: Physician Assistant | Admitting: Physician Assistant

## 2021-10-29 ENCOUNTER — Encounter: Payer: Self-pay | Admitting: Physician Assistant

## 2021-10-29 VITALS — BP 110/64 | HR 94 | Ht 60.0 in | Wt 198.0 lb

## 2021-10-29 DIAGNOSIS — Z8679 Personal history of other diseases of the circulatory system: Secondary | ICD-10-CM | POA: Diagnosis not present

## 2021-10-29 DIAGNOSIS — I10 Essential (primary) hypertension: Secondary | ICD-10-CM

## 2021-10-29 DIAGNOSIS — Z79899 Other long term (current) drug therapy: Secondary | ICD-10-CM | POA: Insufficient documentation

## 2021-10-29 DIAGNOSIS — I5032 Chronic diastolic (congestive) heart failure: Secondary | ICD-10-CM | POA: Insufficient documentation

## 2021-10-29 DIAGNOSIS — Z9889 Other specified postprocedural states: Secondary | ICD-10-CM | POA: Diagnosis not present

## 2021-10-29 DIAGNOSIS — I5042 Chronic combined systolic (congestive) and diastolic (congestive) heart failure: Secondary | ICD-10-CM | POA: Insufficient documentation

## 2021-10-29 DIAGNOSIS — I4819 Other persistent atrial fibrillation: Secondary | ICD-10-CM | POA: Insufficient documentation

## 2021-10-29 DIAGNOSIS — J9611 Chronic respiratory failure with hypoxia: Secondary | ICD-10-CM

## 2021-10-29 DIAGNOSIS — I4821 Permanent atrial fibrillation: Secondary | ICD-10-CM

## 2021-10-29 DIAGNOSIS — I714 Abdominal aortic aneurysm, without rupture, unspecified: Secondary | ICD-10-CM | POA: Insufficient documentation

## 2021-10-29 DIAGNOSIS — I7123 Aneurysm of the descending thoracic aorta, without rupture: Secondary | ICD-10-CM | POA: Diagnosis not present

## 2021-10-29 LAB — CBC
HCT: 47.9 % — ABNORMAL HIGH (ref 36.0–46.0)
Hemoglobin: 15.7 g/dL — ABNORMAL HIGH (ref 12.0–15.0)
MCH: 34.2 pg — ABNORMAL HIGH (ref 26.0–34.0)
MCHC: 32.8 g/dL (ref 30.0–36.0)
MCV: 104.4 fL — ABNORMAL HIGH (ref 80.0–100.0)
Platelets: 139 10*3/uL — ABNORMAL LOW (ref 150–400)
RBC: 4.59 MIL/uL (ref 3.87–5.11)
RDW: 15 % (ref 11.5–15.5)
WBC: 5.8 10*3/uL (ref 4.0–10.5)
nRBC: 0 % (ref 0.0–0.2)

## 2021-10-29 LAB — BASIC METABOLIC PANEL
Anion gap: 8 (ref 5–15)
BUN: 15 mg/dL (ref 8–23)
CO2: 28 mmol/L (ref 22–32)
Calcium: 8.8 mg/dL — ABNORMAL LOW (ref 8.9–10.3)
Chloride: 98 mmol/L (ref 98–111)
Creatinine, Ser: 1 mg/dL (ref 0.44–1.00)
GFR, Estimated: 56 mL/min — ABNORMAL LOW (ref >60)
Glucose, Bld: 123 mg/dL — ABNORMAL HIGH (ref 70–99)
Potassium: 4 mmol/L (ref 3.5–5.1)
Sodium: 134 mmol/L — ABNORMAL LOW (ref 135–145)

## 2021-10-29 LAB — DIGOXIN LEVEL: Digoxin Level: 1.4 ng/mL (ref 0.8–2.0)

## 2021-10-29 NOTE — Patient Instructions (Signed)
Referral placed for vascular center. Their number is [phone].  ? ?Medication Instructions:  ?No changes at this time.  ? ?*If you need a refill on your cardiac medications before your next appointment, please call your pharmacy* ? ? ?Lab Work: ?BMET, CBC, and Dig level today. Go to [employer] entrance and check in at registration.  ? ? ?If you have labs (blood work) drawn today and your tests are completely normal, you will receive your results only by: ?MyChart Message (if you have MyChart) OR ?A paper copy in the mail ?If you have any lab test that is abnormal or we need to change your treatment, we will call you to review the results. ? ? ?Testing/Procedures: ?None ? ? ?Follow-Up: ?At [facility], you and your health needs are our priority.  As part of our continuing mission to provide you with exceptional heart care, we have created designated Provider Care Teams.  These Care Teams include your primary Cardiologist (physician) and Advanced Practice Providers (APPs -  Physician Assistants and Nurse Practitioners) who all work together to provide you with the care you need, when you need it. ? ? ?Your next appointment:   ?3 month(s) ? ?The format for your next appointment:   ?In Person ? ?Provider:   ?Nelva Bush, MD ONLY  ? ? ? ? ?Important Information About Sugar ? ? ? ? ?  ?

## 2021-10-29 NOTE — Telephone Encounter (Signed)
Rise Mu, PA-C  ?10/29/2021  1:18 PM EDT   ?  ?Renal function and potassium are stable ?Blood count is mildly elevated with platelet count being mildly low, though consistent with prior readings ?Digoxin level is mildly elevated ?  ?Recommendations: ?-Hold digoxin for 2 days then decrease digoxin to 0.0625 daily ?-Follow-up digoxin level in 2 weeks  ? ?

## 2021-10-29 NOTE — Telephone Encounter (Signed)
Attempted to call the patient at her preferred number. ?No answer- I left a message to please call back.  ? ? ?

## 2021-10-30 MED ORDER — DIGOXIN 125 MCG PO TABS: 0.0625 mg | ORAL_TABLET | Freq: Every day | ORAL | 3 refills | Status: AC

## 2021-10-30 NOTE — Telephone Encounter (Signed)
Reviewed results and recommendations with patient. She verbalized understanding with no further questions at this time.  ?

## 2021-11-06 ENCOUNTER — Telehealth: Payer: Self-pay | Admitting: Physician Assistant

## 2021-11-06 NOTE — Telephone Encounter (Signed)
Patient called stating her digoxin (LANOXIN) 0.125 MG tablet, was recently changed to 1/2 a tablet. She lives at [facility] in [place], they need to receive an order for them to change her medication dosage.  ?

## 2021-11-06 NOTE — Telephone Encounter (Signed)
Spoke with patient and she requested we send orders to [facility]. She provided both phone & fax numbers provided.  ? ?Phone: [phone] Line disconnects with this number. ?Fax [phone] ?Other number [phone]. Called this number and confirmed fax number. She provided different number of [phone]. Advised that I would fax to both numbers. ? ?Faxed to both numbers and confirmation received. Placed in my files under "Faxed".  ? ? ?

## 2021-11-14 ENCOUNTER — Institutional Professional Consult (permissible substitution): Payer: Medicare Other | Admitting: Internal Medicine

## 2021-11-14 ENCOUNTER — Encounter: Payer: Medicare Other | Admitting: Family Medicine

## 2021-11-14 ENCOUNTER — Ambulatory Visit (INDEPENDENT_AMBULATORY_CARE_PROVIDER_SITE_OTHER): Payer: Medicare Other | Admitting: Family Medicine

## 2021-11-14 ENCOUNTER — Encounter: Payer: Self-pay | Admitting: Family Medicine

## 2021-11-14 VITALS — BP 130/70 | HR 112 | Temp 97.5°F | Ht 60.0 in | Wt 200.5 lb

## 2021-11-14 DIAGNOSIS — Z0000 Encounter for general adult medical examination without abnormal findings: Secondary | ICD-10-CM | POA: Diagnosis not present

## 2021-11-14 DIAGNOSIS — E7800 Pure hypercholesterolemia, unspecified: Secondary | ICD-10-CM | POA: Diagnosis not present

## 2021-11-14 DIAGNOSIS — R413 Other amnesia: Secondary | ICD-10-CM | POA: Diagnosis not present

## 2021-11-14 DIAGNOSIS — I10 Essential (primary) hypertension: Secondary | ICD-10-CM | POA: Diagnosis not present

## 2021-11-14 DIAGNOSIS — J439 Emphysema, unspecified: Secondary | ICD-10-CM | POA: Diagnosis not present

## 2021-11-14 DIAGNOSIS — J9611 Chronic respiratory failure with hypoxia: Secondary | ICD-10-CM | POA: Diagnosis not present

## 2021-11-14 DIAGNOSIS — E559 Vitamin D deficiency, unspecified: Secondary | ICD-10-CM | POA: Diagnosis not present

## 2021-11-14 DIAGNOSIS — M19012 Primary osteoarthritis, left shoulder: Secondary | ICD-10-CM

## 2021-11-14 DIAGNOSIS — I5022 Chronic systolic (congestive) heart failure: Secondary | ICD-10-CM

## 2021-11-14 DIAGNOSIS — I714 Abdominal aortic aneurysm, without rupture, unspecified: Secondary | ICD-10-CM

## 2021-11-14 DIAGNOSIS — E039 Hypothyroidism, unspecified: Secondary | ICD-10-CM

## 2021-11-14 DIAGNOSIS — E2839 Other primary ovarian failure: Secondary | ICD-10-CM

## 2021-11-14 LAB — COMPREHENSIVE METABOLIC PANEL
ALT: 10 U/L (ref 0–35)
AST: 17 U/L (ref 0–37)
Albumin: 4.1 g/dL (ref 3.5–5.2)
Alkaline Phosphatase: 70 U/L (ref 39–117)
BUN: 12 mg/dL (ref 6–23)
CO2: 32 mEq/L (ref 19–32)
Calcium: 9.8 mg/dL (ref 8.4–10.5)
Chloride: 100 mEq/L (ref 96–112)
Creatinine, Ser: 0.82 mg/dL (ref 0.40–1.20)
GFR: 66.41 mL/min (ref >60.00)
Glucose, Bld: 113 mg/dL — ABNORMAL HIGH (ref 70–99)
Potassium: 4.3 mEq/L (ref 3.5–5.1)
Sodium: 141 mEq/L (ref 135–145)
Total Bilirubin: 1.2 mg/dL (ref 0.2–1.2)
Total Protein: 7 g/dL (ref 6.0–8.3)

## 2021-11-14 LAB — LIPID PANEL
Cholesterol: 198 mg/dL (ref 0–200)
HDL: 45 mg/dL (ref >39.00)
LDL Cholesterol: 130 mg/dL — ABNORMAL HIGH (ref 0–99)
NonHDL: 152.8
Total CHOL/HDL Ratio: 4
Triglycerides: 113 mg/dL (ref 0.0–149.0)
VLDL: 22.6 mg/dL (ref 0.0–40.0)

## 2021-11-14 LAB — VITAMIN D 25 HYDROXY (VIT D DEFICIENCY, FRACTURES): VITD: 36.53 ng/mL (ref 30.00–100.00)

## 2021-11-14 LAB — VITAMIN B12: Vitamin B-12: 671 pg/mL (ref 211–911)

## 2021-11-14 NOTE — Progress Notes (Signed)
Patient ID: [name], female    DOB: [birth year], 81 y.o.   MRN: [id number]  This visit was conducted in person.   CC:   Medicare Wellness  Subjective:   HPI: [name] is a 81 y.o. female presenting on 11/14/2021 for medicare wellness  The patient presents for annual medicare wellness, complete physical and review of chronic health problems. He/She also has the following acute concerns today:none  I have personally reviewed the Medicare Annual Wellness questionnaire and have noted 1. The patient's medical and social history 2. Their use of alcohol, tobacco or illicit drugs 3. Their current medications and supplements 4. The patient's functional ability including ADL's, fall risks, home safety risks and hearing or visual             impairment. 5. Diet and physical activities 6. Evidence for depression or mood disorders 7.         Updated provider list Cognitive evaluation was performed and recorded on pt medicare questionnaire form. The patients weight, height, BMI and visual acuity have been recorded in the chart   I have made referrals, counseling and provided education to the patient based review of the above and I have provided the pt with a written personalized care plan for preventive services.   Documentation of this information was scanned into the electronic record under the media tab.   Advance directives and end of life planning reviewed in detail with patient and documented in EMR. Patient given handout on advance care directives if needed. HCPOA and living will updated if needed.   1 accidental  fall in last 12 months. Using walker   CHF, systolic and diastolic, cardiomyopathy, nonischemic, permanent atrial fibrillation: Rate controlled with metoprolol and digoxin.  On Eliquis for anticoagulation.  Given a CHA2DS2-VASc of at least 6 (CHF, HTN, age x2, vascular disease, sex category), she remains on apixaban (does not meet reduced dosing  criteria). Peripheral edema stable on 80 mg furosemide daily Wt Readings from Last 3 Encounters:  11/14/21 200 lb 8 oz (90.9 kg)  10/29/21 198 lb (89.8 kg)  08/30/21 195 lb (88.5 kg)   Changing to [place] [facility] to be closer to home: Reviewed 10/29/2021 office visit with Bonnie Faith, PA Considering addition of Entresto in future as well as possible SGLT2 inhibitor.  Has follow up in 3 months  She denies chest pain, no increase in edema, but eating more food/salt ( cheezits)...   COPD, history of lung cancer, OSA: On continuous oxygen.  Referral pending to change to [facility] office pulmonologist. Using Spiriva daily, DuoNebs and albuterol as needed  AAA: Status post surgery, also has a thoracic aneurysm: Pending change to vascular MD in [place].  Hypertension:  Well controlled on metoprolol, furosemide and digoxin   BP Readings from Last 3 Encounters:  11/14/21 130/70  10/29/21 110/64  08/30/21 116/70     Hypothyroid : Stable control at last check in January on levothyroxine 88 mcg daily Lab Results  Component Value Date   TSH 3.600 07/15/2021    Left shoulder osteoarthritis, severe:  Not a surgical candidate. Steroid injections not helpful.  Plans to change to [facility] office as well.  She is not a surgical candidate it is currently treated with morphine.   Relevant past medical, surgical, family and social history reviewed and updated as indicated. Interim medical history since our last visit reviewed. Allergies and medications reviewed and updated. Outpatient Medications Prior to Visit  Medication Sig Dispense Refill  acetaminophen (TYLENOL) 500 MG tablet Take 2 tablets (1,000 mg total) by mouth every 6 (six) hours as needed for fever, headache, mild pain or moderate pain (backpain). 30 tablet 0   albuterol (VENTOLIN HFA) 108 (90 Base) MCG/ACT inhaler TAKE 2 PUFFS EVERY 6 HOURS AS NEEDED FORSHORTNESS OF BREATH/WHEEZING 54 g 4   apixaban (ELIQUIS) 5 MG TABS tablet  Take 1 tablet (5 mg total) by mouth 2 (two) times daily. 60 tablet    Cholecalciferol (VITAMIN D) 1000 UNITS capsule Take 1,000 Units by mouth daily.     citalopram (CELEXA) 20 MG tablet Take 20 mg by mouth daily.     digoxin (LANOXIN) 0.125 MG tablet Take 0.5 tablets (0.0625 mg total) by mouth daily. 90 tablet 3   furosemide (LASIX) 40 MG tablet Take 2 tablets ( 80 mg ) daily 90 tablet 3   gabapentin (NEURONTIN) 100 MG capsule Take 1 capsule by mouth 3 (three) times daily.     ipratropium-albuterol (DUONEB) 0.5-2.5 (3) MG/3ML SOLN Take 3 mLs by nebulization 2 (two) times daily.     levalbuterol (XOPENEX HFA) 45 MCG/ACT inhaler Inhale 2 puffs into the lungs every 8 (eight) hours as needed.     levothyroxine (SYNTHROID, LEVOTHROID) 88 MCG tablet Take 88 mcg by mouth daily.     melatonin 5 MG TABS Take 5 mg by mouth at bedtime.     metoprolol tartrate (LOPRESSOR) 25 MG tablet TAKE 1/2 TABLET TWICE DAILY 90 tablet 3   morphine (MSIR) 15 MG tablet Take 0.5 tablets (7.5 mg total) by mouth every 6 (six) hours as needed. 10 tablet 0   OXYGEN Inhale 4 L into the lungs daily. Patient states she wears 2L at night and 4L when up and moving     pantoprazole (PROTONIX) 40 MG tablet Take 1 tablet (40 mg total) by mouth daily. 30 tablet 0   potassium chloride SA (KLOR-CON M) 20 MEQ tablet Take 20 mEq by mouth 2 (two) times daily.     tiotropium (SPIRIVA HANDIHALER) 18 MCG inhalation capsule Place 1 capsule (18 mcg total) into inhaler and inhale daily. 90 capsule 4   vitamin B-12 (CYANOCOBALAMIN) 100 MCG tablet Take 100 mcg by mouth daily.     No facility-administered medications prior to visit.     Per HPI unless specifically indicated in ROS section below Review of Systems  Constitutional:  Positive for fatigue. Negative for fever.  HENT:  Negative for congestion.   Eyes:  Negative for pain.  Respiratory:  Positive for shortness of breath. Negative for cough.   Cardiovascular:  Negative for chest pain,  palpitations and leg swelling.  Gastrointestinal:  Negative for abdominal pain.  Genitourinary:  Negative for dysuria and vaginal bleeding.  Musculoskeletal:  Negative for back pain.  Neurological:  Negative for syncope, light-headedness and headaches.  Psychiatric/Behavioral:  Negative for dysphoric mood.    Objective:  There were no vitals taken for this visit.  Wt Readings from Last 3 Encounters:  10/29/21 198 lb (89.8 kg)  08/30/21 195 lb (88.5 kg)  07/25/21 194 lb 6.4 oz (88.2 kg)      Physical Exam Constitutional:      General: She is not in acute distress.    Appearance: Normal appearance. She is well-developed. She is not ill-appearing or toxic-appearing.  HENT:     Head: Normocephalic.     Right Ear: Hearing, tympanic membrane, ear canal and external ear normal. Tympanic membrane is not erythematous, retracted or bulging.  Left Ear: Hearing, tympanic membrane, ear canal and external ear normal. Tympanic membrane is not erythematous, retracted or bulging.     Nose: No mucosal edema or rhinorrhea.     Right Sinus: No maxillary sinus tenderness or frontal sinus tenderness.     Left Sinus: No maxillary sinus tenderness or frontal sinus tenderness.     Mouth/Throat:     Pharynx: Uvula midline.  Eyes:     General: Lids are normal. Lids are everted, no foreign bodies appreciated.     Conjunctiva/sclera: Conjunctivae normal.     Pupils: Pupils are equal, round, and reactive to light.  Neck:     Thyroid: No thyroid mass or thyromegaly.     Vascular: No carotid bruit.     Trachea: Trachea normal.  Cardiovascular:     Rate and Rhythm: Normal rate and regular rhythm.     Pulses: Normal pulses.     Heart sounds: Normal heart sounds, S1 normal and S2 normal. No murmur heard.    No friction rub. No gallop.     Comments: Chronic venous stasis changes Pulmonary:     Effort: Pulmonary effort is normal. No tachypnea or respiratory distress.     Breath sounds: Normal breath  sounds. No decreased breath sounds, wheezing, rhonchi or rales.  Abdominal:     General: Bowel sounds are normal.     Palpations: Abdomen is soft.     Tenderness: There is no abdominal tenderness.  Musculoskeletal:     Cervical back: Normal range of motion and neck supple.     Right lower leg: 1+ Edema present.     Left lower leg: 1+ Edema present.  Skin:    General: Skin is warm and dry.     Findings: No rash.  Neurological:     Mental Status: She is alert.  Psychiatric:        Mood and Affect: Mood is not anxious or depressed.        Speech: Speech normal.        Behavior: Behavior normal. Behavior is cooperative.        Thought Content: Thought content normal.        Judgment: Judgment normal.       Results for orders placed or performed during the hospital encounter of 10/29/21  Digoxin level  Result Value Ref Range   Digoxin Level 1.4 0.8 - 2.0 ng/mL  CBC  Result Value Ref Range   WBC 5.8 4.0 - 10.5 K/uL   RBC 4.59 3.87 - 5.11 MIL/uL   Hemoglobin 15.7 (H) 12.0 - 15.0 g/dL   HCT 47.9 (H) 36.0 - 46.0 %   MCV 104.4 (H) 80.0 - 100.0 fL   MCH 34.2 (H) 26.0 - 34.0 pg   MCHC 32.8 30.0 - 36.0 g/dL   RDW 15.0 11.5 - 15.5 %   Platelets 139 (L) 150 - 400 K/uL   nRBC 0.0 0.0 - 0.2 %  Basic metabolic panel  Result Value Ref Range   Sodium 134 (L) 135 - 145 mmol/L   Potassium 4.0 3.5 - 5.1 mmol/L   Chloride 98 98 - 111 mmol/L   CO2 28 22 - 32 mmol/L   Glucose, Bld 123 (H) 70 - 99 mg/dL   BUN 15 8 - 23 mg/dL   Creatinine, Ser 1.00 0.44 - 1.00 mg/dL   Calcium 8.8 (L) 8.9 - 10.3 mg/dL   GFR, Estimated 56 (L) >60 mL/min   Anion gap 8 5 - 15  COVID 19 screen:  No recent travel or known exposure to COVID19 The patient denies respiratory symptoms of COVID 19 at this time. The importance of social distancing was discussed today.   Assessment and Plan The patient's preventative maintenance and recommended screening tests for an annual wellness exam were reviewed in full  today. Brought up to date unless services declined.  Counselled on the importance of diet, exercise, and its role in overall health and mortality. The patient's FH and SH was reviewed, including their home life, tobacco status, and drug and alcohol status.    Vaccines: Consider tetanus, shingles as well as Prevnar 20 vaccines.  She has received 3 COVID vaccines, I recommended the by [facility] booster. Pap/DVE: No further indication given age Mammo: No further indication given age Bone Density: No record of previous bone densities. Colon: No further indication given age  Smoking Status: Former smoker ETOH/ drug use: None/none  Problem List Items Addressed This Visit     AAA (abdominal aortic aneurysm) ([place]) (Chronic)    Status post surgery, also has a thoracic aneurysm: Pending change to vascular MD in [place].      Chronic respiratory failure with hypoxia (HCC) (Chronic)    On continuous oxygen      Chronic systolic CHF (congestive heart failure) (HCC) (Chronic)    Rate controlled with metoprolol and digoxin.  On Eliquis for anticoagulation.  Given a CHA2DS2-VASc of at least 6 (CHF, HTN, age x2, vascular disease, sex category), she remains on apixaban (does not meet reduced dosing criteria). Peripheral edema stable on 80 mg furosemide daily Considering addition of Entresto in future as well as possible SGLT2 inhibitor.  Has follow up in 3 months      COPD (chronic obstructive pulmonary disease) (HCC) (Chronic)    On continuous oxygen.  Referral pending to change to [facility] office pulmonologist. Using Spiriva daily, DuoNebs and albuterol as needed      HTN (hypertension) (Chronic)    Well controlled on metoprolol, furosemide and digoxin      Hypothyroid (Chronic)    Stable control at last check in January on levothyroxine 88 mcg daily      Osteoarthritis of left shoulder    Not a surgical candidate. Steroid injections not helpful.  Plans to change to [facility] office  as well.  She is not a surgical candidate it is currently treated with morphine.      Other Visit Diagnoses     Medicare annual wellness visit, subsequent    -  Primary   Estrogen deficiency       Relevant Orders   DG Bone Density   Hypercholesteremia       Relevant Orders   Comprehensive metabolic panel (Completed)   Lipid panel (Completed)   Memory loss       Relevant Orders   Vitamin B12 (Completed)   Vitamin D deficiency       Relevant Orders   VITAMIN D 25 Hydroxy (Vit-D Deficiency, Fractures) (Completed)        Eliezer Lofts, MD

## 2021-11-14 NOTE — Patient Instructions (Addendum)
Consider shingrix vaccine.  Work on increasing activities mental and physical.  Please call the location of your choice from the menu below to schedule your Bone Density appointment.    [facility]                      Phone:  [phone], [place] [address]                                                             Services: Traditional and 3D Mammogram, [place] [facility]                 Phone: [phone] [address], [place] [address]    Service: Bone Density ONLY   *this site does NOT perform mammograms  [place]                        Phone:  [phone] [address], [address] [address]                                            Services:  3D Mammogram and [place] [facility]   Phone:  [phone]   [place], [place] [address]                                            Services: 3D Mammogram and [place]  [place] at [facility])  Phone:  [phone]   [address] [place],  [address]  Services:  3D Mammogram and Bone Density

## 2021-11-15 ENCOUNTER — Encounter: Payer: Self-pay | Admitting: Family Medicine

## 2021-11-20 ENCOUNTER — Other Ambulatory Visit: Payer: Self-pay | Admitting: *Deleted

## 2021-11-20 ENCOUNTER — Other Ambulatory Visit: Payer: Medicare Other

## 2021-11-20 DIAGNOSIS — R8290 Unspecified abnormal findings in urine: Secondary | ICD-10-CM

## 2021-11-21 DIAGNOSIS — I130 Hypertensive heart and chronic kidney disease with heart failure and stage 1 through stage 4 chronic kidney disease, or unspecified chronic kidney disease: Secondary | ICD-10-CM | POA: Diagnosis not present

## 2021-11-21 DIAGNOSIS — R269 Unspecified abnormalities of gait and mobility: Secondary | ICD-10-CM | POA: Diagnosis not present

## 2021-11-21 DIAGNOSIS — J449 Chronic obstructive pulmonary disease, unspecified: Secondary | ICD-10-CM | POA: Diagnosis not present

## 2021-11-21 DIAGNOSIS — R600 Localized edema: Secondary | ICD-10-CM | POA: Diagnosis not present

## 2021-11-21 DIAGNOSIS — N1831 Chronic kidney disease, stage 3a: Secondary | ICD-10-CM | POA: Diagnosis not present

## 2021-11-21 DIAGNOSIS — I5042 Chronic combined systolic (congestive) and diastolic (congestive) heart failure: Secondary | ICD-10-CM | POA: Diagnosis not present

## 2021-11-21 LAB — MICROSCOPIC MESSAGE

## 2021-11-21 LAB — URINALYSIS, ROUTINE W REFLEX MICROSCOPIC
Bilirubin Urine: NEGATIVE
Glucose, UA: NEGATIVE
Hgb urine dipstick: NEGATIVE
Hyaline Cast: NONE SEEN /LPF
Ketones, ur: NEGATIVE
Nitrite: NEGATIVE
Protein, ur: NEGATIVE
RBC / HPF: NONE SEEN /HPF (ref 0–2)
Specific Gravity, Urine: 1.006 (ref 1.001–1.035)
Squamous Epithelial / HPF: NONE SEEN /HPF (ref <=5)
WBC, UA: NONE SEEN /HPF (ref 0–5)
pH: 6 (ref 5.0–8.0)

## 2021-11-21 LAB — URINE CULTURE
MICRO NUMBER:: 13464764
SPECIMEN QUALITY:: ADEQUATE

## 2021-12-02 ENCOUNTER — Encounter: Payer: Self-pay | Admitting: Pulmonary Disease

## 2021-12-02 ENCOUNTER — Emergency Department: Payer: Medicare Other

## 2021-12-02 ENCOUNTER — Telehealth: Payer: Self-pay

## 2021-12-02 ENCOUNTER — Ambulatory Visit (INDEPENDENT_AMBULATORY_CARE_PROVIDER_SITE_OTHER): Payer: Medicare Other | Admitting: Pulmonary Disease

## 2021-12-02 ENCOUNTER — Other Ambulatory Visit: Payer: Self-pay

## 2021-12-02 ENCOUNTER — Inpatient Hospital Stay
Admission: EM | Admit: 2021-12-02 | Discharge: 2021-12-09 | DRG: 291 | Disposition: A | Discharge status: Home Health | Payer: Medicare Other | Attending: Internal Medicine | Admitting: Internal Medicine

## 2021-12-02 VITALS — BP 90/40 | HR 125 | Temp 97.8°F | Ht 60.0 in | Wt 190.0 lb

## 2021-12-02 DIAGNOSIS — I5033 Acute on chronic diastolic (congestive) heart failure: Secondary | ICD-10-CM | POA: Diagnosis not present

## 2021-12-02 DIAGNOSIS — I959 Hypotension, unspecified: Secondary | ICD-10-CM | POA: Diagnosis present

## 2021-12-02 DIAGNOSIS — I4892 Unspecified atrial flutter: Secondary | ICD-10-CM | POA: Diagnosis present

## 2021-12-02 DIAGNOSIS — I739 Peripheral vascular disease, unspecified: Secondary | ICD-10-CM | POA: Diagnosis present

## 2021-12-02 DIAGNOSIS — Z86718 Personal history of other venous thrombosis and embolism: Secondary | ICD-10-CM

## 2021-12-02 DIAGNOSIS — Z85118 Personal history of other malignant neoplasm of bronchus and lung: Secondary | ICD-10-CM

## 2021-12-02 DIAGNOSIS — I4891 Unspecified atrial fibrillation: Secondary | ICD-10-CM

## 2021-12-02 DIAGNOSIS — Z20822 Contact with and (suspected) exposure to covid-19: Secondary | ICD-10-CM | POA: Diagnosis present

## 2021-12-02 DIAGNOSIS — N6019 Diffuse cystic mastopathy of unspecified breast: Secondary | ICD-10-CM | POA: Diagnosis present

## 2021-12-02 DIAGNOSIS — E785 Hyperlipidemia, unspecified: Secondary | ICD-10-CM | POA: Diagnosis present

## 2021-12-02 DIAGNOSIS — M545 Low back pain, unspecified: Secondary | ICD-10-CM | POA: Diagnosis present

## 2021-12-02 DIAGNOSIS — M1990 Unspecified osteoarthritis, unspecified site: Secondary | ICD-10-CM | POA: Diagnosis present

## 2021-12-02 DIAGNOSIS — J9621 Acute and chronic respiratory failure with hypoxia: Secondary | ICD-10-CM | POA: Diagnosis not present

## 2021-12-02 DIAGNOSIS — R5381 Other malaise: Secondary | ICD-10-CM | POA: Diagnosis not present

## 2021-12-02 DIAGNOSIS — Z79899 Other long term (current) drug therapy: Secondary | ICD-10-CM

## 2021-12-02 DIAGNOSIS — R0902 Hypoxemia: Secondary | ICD-10-CM | POA: Diagnosis not present

## 2021-12-02 DIAGNOSIS — I110 Hypertensive heart disease with heart failure: Principal | ICD-10-CM | POA: Diagnosis present

## 2021-12-02 DIAGNOSIS — Z8249 Family history of ischemic heart disease and other diseases of the circulatory system: Secondary | ICD-10-CM

## 2021-12-02 DIAGNOSIS — G8929 Other chronic pain: Secondary | ICD-10-CM | POA: Diagnosis present

## 2021-12-02 DIAGNOSIS — E890 Postprocedural hypothyroidism: Secondary | ICD-10-CM | POA: Diagnosis present

## 2021-12-02 DIAGNOSIS — F32A Depression, unspecified: Secondary | ICD-10-CM | POA: Diagnosis present

## 2021-12-02 DIAGNOSIS — Z9981 Dependence on supplemental oxygen: Secondary | ICD-10-CM | POA: Diagnosis not present

## 2021-12-02 DIAGNOSIS — Z6838 Body mass index (BMI) 38.0-38.9, adult: Secondary | ICD-10-CM | POA: Diagnosis not present

## 2021-12-02 DIAGNOSIS — Z7189 Other specified counseling: Secondary | ICD-10-CM

## 2021-12-02 DIAGNOSIS — I4821 Permanent atrial fibrillation: Secondary | ICD-10-CM | POA: Diagnosis present

## 2021-12-02 DIAGNOSIS — J9690 Respiratory failure, unspecified, unspecified whether with hypoxia or hypercapnia: Secondary | ICD-10-CM | POA: Diagnosis not present

## 2021-12-02 DIAGNOSIS — E669 Obesity, unspecified: Secondary | ICD-10-CM | POA: Diagnosis present

## 2021-12-02 DIAGNOSIS — R0602 Shortness of breath: Secondary | ICD-10-CM | POA: Diagnosis not present

## 2021-12-02 DIAGNOSIS — F419 Anxiety disorder, unspecified: Secondary | ICD-10-CM | POA: Diagnosis present

## 2021-12-02 DIAGNOSIS — D696 Thrombocytopenia, unspecified: Secondary | ICD-10-CM | POA: Diagnosis present

## 2021-12-02 DIAGNOSIS — Z87891 Personal history of nicotine dependence: Secondary | ICD-10-CM

## 2021-12-02 DIAGNOSIS — Z7951 Long term (current) use of inhaled steroids: Secondary | ICD-10-CM

## 2021-12-02 DIAGNOSIS — I5043 Acute on chronic combined systolic (congestive) and diastolic (congestive) heart failure: Secondary | ICD-10-CM

## 2021-12-02 DIAGNOSIS — Z96652 Presence of left artificial knee joint: Secondary | ICD-10-CM | POA: Diagnosis present

## 2021-12-02 DIAGNOSIS — Z789 Other specified health status: Secondary | ICD-10-CM | POA: Diagnosis not present

## 2021-12-02 DIAGNOSIS — Z515 Encounter for palliative care: Secondary | ICD-10-CM | POA: Diagnosis not present

## 2021-12-02 DIAGNOSIS — I509 Heart failure, unspecified: Secondary | ICD-10-CM | POA: Diagnosis not present

## 2021-12-02 DIAGNOSIS — E039 Hypothyroidism, unspecified: Secondary | ICD-10-CM | POA: Diagnosis present

## 2021-12-02 DIAGNOSIS — Z8679 Personal history of other diseases of the circulatory system: Secondary | ICD-10-CM

## 2021-12-02 DIAGNOSIS — G4733 Obstructive sleep apnea (adult) (pediatric): Secondary | ICD-10-CM | POA: Diagnosis present

## 2021-12-02 DIAGNOSIS — Z7401 Bed confinement status: Secondary | ICD-10-CM | POA: Diagnosis not present

## 2021-12-02 DIAGNOSIS — Z7901 Long term (current) use of anticoagulants: Secondary | ICD-10-CM | POA: Diagnosis not present

## 2021-12-02 DIAGNOSIS — Z833 Family history of diabetes mellitus: Secondary | ICD-10-CM

## 2021-12-02 DIAGNOSIS — J9601 Acute respiratory failure with hypoxia: Secondary | ICD-10-CM

## 2021-12-02 DIAGNOSIS — R69 Illness, unspecified: Secondary | ICD-10-CM | POA: Diagnosis not present

## 2021-12-02 DIAGNOSIS — J449 Chronic obstructive pulmonary disease, unspecified: Secondary | ICD-10-CM | POA: Diagnosis present

## 2021-12-02 DIAGNOSIS — J90 Pleural effusion, not elsewhere classified: Secondary | ICD-10-CM | POA: Diagnosis not present

## 2021-12-02 DIAGNOSIS — Z79890 Hormone replacement therapy (postmenopausal): Secondary | ICD-10-CM

## 2021-12-02 LAB — CBC WITH DIFFERENTIAL/PLATELET
Abs Immature Granulocytes: 0.01 10*3/uL (ref 0.00–0.07)
Basophils Absolute: 0 10*3/uL (ref 0.0–0.1)
Basophils Relative: 0 %
Eosinophils Absolute: 0 10*3/uL (ref 0.0–0.5)
Eosinophils Relative: 1 %
HCT: 48.5 % — ABNORMAL HIGH (ref 36.0–46.0)
Hemoglobin: 15.8 g/dL — ABNORMAL HIGH (ref 12.0–15.0)
Immature Granulocytes: 0 %
Lymphocytes Relative: 19 %
Lymphs Abs: 1 10*3/uL (ref 0.7–4.0)
MCH: 34.7 pg — ABNORMAL HIGH (ref 26.0–34.0)
MCHC: 32.6 g/dL (ref 30.0–36.0)
MCV: 106.6 fL — ABNORMAL HIGH (ref 80.0–100.0)
Monocytes Absolute: 0.4 10*3/uL (ref 0.1–1.0)
Monocytes Relative: 7 %
Neutro Abs: 4 10*3/uL (ref 1.7–7.7)
Neutrophils Relative %: 73 %
Platelets: 105 10*3/uL — ABNORMAL LOW (ref 150–400)
RBC: 4.55 MIL/uL (ref 3.87–5.11)
RDW: 13.2 % (ref 11.5–15.5)
WBC: 5.4 10*3/uL (ref 4.0–10.5)
nRBC: 0 % (ref 0.0–0.2)

## 2021-12-02 LAB — BASIC METABOLIC PANEL
Anion gap: 5 (ref 5–15)
BUN: 14 mg/dL (ref 8–23)
CO2: 32 mmol/L (ref 22–32)
Calcium: 9.1 mg/dL (ref 8.9–10.3)
Chloride: 102 mmol/L (ref 98–111)
Creatinine, Ser: 0.73 mg/dL (ref 0.44–1.00)
GFR, Estimated: >60 mL/min (ref >60)
Glucose, Bld: 122 mg/dL — ABNORMAL HIGH (ref 70–99)
Potassium: 4.2 mmol/L (ref 3.5–5.1)
Sodium: 139 mmol/L (ref 135–145)

## 2021-12-02 LAB — SARS CORONAVIRUS 2 BY RT PCR: SARS Coronavirus 2 by RT PCR: NEGATIVE

## 2021-12-02 LAB — BRAIN NATRIURETIC PEPTIDE: B Natriuretic Peptide: 367.6 pg/mL — ABNORMAL HIGH (ref 0.0–100.0)

## 2021-12-02 LAB — DIGOXIN LEVEL: Digoxin Level: 0.7 ng/mL — ABNORMAL LOW (ref 0.8–2.0)

## 2021-12-02 LAB — TSH: TSH: 1.918 u[IU]/mL (ref 0.350–4.500)

## 2021-12-02 LAB — TROPONIN I (HIGH SENSITIVITY): Troponin I (High Sensitivity): 18 ng/L — ABNORMAL HIGH (ref <18)

## 2021-12-02 LAB — PROCALCITONIN: Procalcitonin: <0.1 ng/mL

## 2021-12-02 LAB — MAGNESIUM: Magnesium: 2.1 mg/dL (ref 1.7–2.4)

## 2021-12-02 MED ORDER — CITALOPRAM HYDROBROMIDE 20 MG PO TABS
20.0000 mg | ORAL_TABLET | Freq: Every day | ORAL | Status: DC
  Administered 2021-12-03 – 2021-12-09 (×7): 20 mg via ORAL
  Filled 2021-12-02 (×8): qty 1

## 2021-12-02 MED ORDER — LEVOTHYROXINE SODIUM 88 MCG PO TABS
88.0000 ug | ORAL_TABLET | Freq: Every day | ORAL | Status: DC
Start: 2021-12-03 — End: 2021-12-09
  Administered 2021-12-03 – 2021-12-09 (×7): 88 ug via ORAL
  Filled 2021-12-02 (×7): qty 1

## 2021-12-02 MED ORDER — FUROSEMIDE 10 MG/ML IJ SOLN
40.0000 mg | Freq: Every day | INTRAMUSCULAR | Status: DC
  Administered 2021-12-03 – 2021-12-04 (×2): 40 mg via INTRAVENOUS
  Filled 2021-12-02 (×2): qty 4

## 2021-12-02 MED ORDER — APIXABAN 5 MG PO TABS
5.0000 mg | ORAL_TABLET | Freq: Two times a day (BID) | ORAL | Status: DC
  Administered 2021-12-03 – 2021-12-09 (×13): 5 mg via ORAL
  Filled 2021-12-02 (×13): qty 1

## 2021-12-02 MED ORDER — PANTOPRAZOLE SODIUM 40 MG PO TBEC
40.0000 mg | DELAYED_RELEASE_TABLET | Freq: Every day | ORAL | Status: DC
  Administered 2021-12-03 – 2021-12-09 (×7): 40 mg via ORAL
  Filled 2021-12-02 (×7): qty 1

## 2021-12-02 MED ORDER — IPRATROPIUM-ALBUTEROL 0.5-2.5 (3) MG/3ML IN SOLN
3.0000 mL | Freq: Four times a day (QID) | RESPIRATORY_TRACT | Status: DC | PRN
  Administered 2021-12-04: 3 mL via RESPIRATORY_TRACT
  Filled 2021-12-02: qty 3

## 2021-12-02 MED ORDER — POTASSIUM CHLORIDE CRYS ER 20 MEQ PO TBCR
20.0000 meq | EXTENDED_RELEASE_TABLET | Freq: Two times a day (BID) | ORAL | Status: DC
  Administered 2021-12-02 – 2021-12-09 (×14): 20 meq via ORAL
  Filled 2021-12-02 (×14): qty 1

## 2021-12-02 MED ORDER — VITAMIN D 25 MCG (1000 UNIT) PO TABS
1000.0000 [IU] | ORAL_TABLET | Freq: Every day | ORAL | Status: DC
  Administered 2021-12-03 – 2021-12-09 (×7): 1000 [IU] via ORAL
  Filled 2021-12-02 (×7): qty 1

## 2021-12-02 MED ORDER — ONDANSETRON HCL 4 MG/2ML IJ SOLN: 4.0000 mg | Freq: Four times a day (QID) | INTRAMUSCULAR | Status: DC | PRN

## 2021-12-02 MED ORDER — VITAMIN B-12 100 MCG PO TABS
100.0000 ug | ORAL_TABLET | Freq: Every day | ORAL | Status: DC
Start: 2021-12-03 — End: 2021-12-09
  Administered 2021-12-03 – 2021-12-09 (×7): 100 ug via ORAL
  Filled 2021-12-02 (×7): qty 1

## 2021-12-02 MED ORDER — METOPROLOL TARTRATE 25 MG PO TABS
12.5000 mg | ORAL_TABLET | Freq: Two times a day (BID) | ORAL | Status: DC
  Administered 2021-12-02 – 2021-12-09 (×12): 12.5 mg via ORAL
  Filled 2021-12-02 (×14): qty 1

## 2021-12-02 MED ORDER — GABAPENTIN 100 MG PO CAPS
100.0000 mg | ORAL_CAPSULE | Freq: Three times a day (TID) | ORAL | Status: DC
  Administered 2021-12-02 – 2021-12-09 (×21): 100 mg via ORAL
  Filled 2021-12-02 (×21): qty 1

## 2021-12-02 MED ORDER — FUROSEMIDE 10 MG/ML IJ SOLN
40.0000 mg | Freq: Once | INTRAMUSCULAR | Status: AC
  Administered 2021-12-02: 40 mg via INTRAVENOUS
  Filled 2021-12-02: qty 4

## 2021-12-02 MED ORDER — ACETAMINOPHEN 500 MG PO TABS
1000.0000 mg | ORAL_TABLET | Freq: Four times a day (QID) | ORAL | Status: DC | PRN
  Administered 2021-12-08: 1000 mg via ORAL
  Filled 2021-12-02: qty 2

## 2021-12-02 MED ORDER — MORPHINE SULFATE 15 MG PO TABS
7.5000 mg | ORAL_TABLET | Freq: Four times a day (QID) | ORAL | Status: DC | PRN
  Administered 2021-12-02 – 2021-12-06 (×4): 7.5 mg via ORAL
  Filled 2021-12-02 (×4): qty 1

## 2021-12-02 MED ORDER — MELATONIN 5 MG PO TABS
5.0000 mg | ORAL_TABLET | Freq: Every day | ORAL | Status: DC
  Administered 2021-12-02 – 2021-12-08 (×7): 5 mg via ORAL
  Filled 2021-12-02 (×7): qty 1

## 2021-12-02 MED ORDER — DIGOXIN 125 MCG PO TABS
0.0625 mg | ORAL_TABLET | Freq: Every day | ORAL | Status: DC
  Administered 2021-12-03 – 2021-12-09 (×7): 0.0625 mg via ORAL
  Filled 2021-12-02 (×7): qty 0.5

## 2021-12-02 MED ORDER — ONDANSETRON HCL 4 MG PO TABS
4.0000 mg | ORAL_TABLET | Freq: Four times a day (QID) | ORAL | Status: DC | PRN
  Administered 2021-12-02: 4 mg via ORAL
  Filled 2021-12-02: qty 1

## 2021-12-02 NOTE — ED Notes (Signed)
Pt's resp reg/unlabored currently; skin dry, calmly laying on stretcher. Pt denies CP, HA, nausea, dizziness. Reports increased SOB, states usually on 4L at [facility], COPD history along with Afib, states is on "blood thinner" for the Afib, denies fever, chills, N/V/D.

## 2021-12-02 NOTE — ED Notes (Addendum)
Pt okay from provider Z. Smith to allow pt to eat/drink. Given drink. Dietary called for food tray as none currently available in the ER.

## 2021-12-02 NOTE — Progress Notes (Signed)
Pt stated she does not wear CPAP at home and does not want to wear one at this time. Pt is aware that one will be made available if she changes her mind.

## 2021-12-02 NOTE — Consult Note (Signed)
Cardiology Consultation:   Patient ID: [name] MRN: [id number]; DOB: 12/29/1940  Admit date: 12/02/2021 Date of Consult: 12/02/2021  PCP:  Bonnie Sanders, MD   [facility] Providers Cardiologist:  Bonnie Martinique, MD        Patient Profile:   [name] is a 81 y.o. female with a hx of chronic combined systolic and diastolic CHF, permanent A-fib on apixaban, essential hypertension, hyperlipidemia, PAD, non-small cell lung cancer with metastasis to the supraclavicular and mediastinal lymph nodes diagnosed in 2004, bilateral lower extremity DVT, hypothyroidism, chronic hypoxic respiratory failure on chronic home O2, COPD, and AAA status post aortic/biiliac stent graft, and descending thoracic aortic aneurysm measuring 3.3 cm followed by vascular surgery in [place] who is being seen 12/02/2021 for the evaluation of CHF exacerbation at the request of Dr. Francine Ball  History of Present Illness:   [name] was admitted to the [facility] in 2018 with rapid A-fib and CHF.  She was managed with rate control and diuresis and she is not felt to be a candidate for amiodarone with underlying pulmonary disease, Tikosyn and sotalol were contraindicated secondary to prolonged QT, and Multaq and flecainide were not good choices due to underlying CHF.  She has historically been followed by [name] by Dr. Martinique and Bonnie Sera, NP with plans to transition her to the [place] office that she now resides at [facility].  She was admitted to the [facility] in 11 of 2022 with sepsis secondary to ESBL UTI with admission complicated by acute metabolic encephalopathy, acute on chronic hypoxic respiratory failure, and A-fib with RVR.  She was readmitted on 05/2021 with acute on chronic hypoxic respiratory failure secondary to COPD with admission notable for acute metabolic encephalopathy and A-fib with RVR with recommendations to continue rate control strategy.  Echo during that admission  demonstrated an EF of 35 to 40% with global hypokinesis, mild LVH, mildly reduced RV systolic function with minimal ventricular cavity size, moderately elevated PASP estimated at 47.9 mmHg, moderate biatrial enlargement with mild mitral valve regurgitation and severe mitral annular calcification, moderate tricuspid regurgitation, and aortic valve sclerosis without evidence of stenosis.  EF was unchanged when compared to prior echo dating back to 2018.  She was most recently admitted to the [facility] in 06/2021 after sustaining multiple falls out of bed.  She presents back to the emergency department today 6 05/2022 after the recommendation of her pulmonologist for evaluation during the office with increased oxygen requirement and relative hypotension.  The patient states she comes to her pulmonologist for evaluation of worsening shortness of breath from her baseline while at the office her pulse oximeter was reading 88% with heart rates in the 120s and she was sent to the emergency department for evaluation.    Initial vital signs: Blood pressure 107/79, heart rate 125, respiratory rate of 20, temperature 97.9, O2 sats of 90%  Pertinent labs: Hemoglobin 15.8, hematocrit of 48.5, platelets of 105, glucose 122, BNP 367.6, high-sensitivity troponin of 18, respiratory panel negative  Imaging: Chest x-ray etc. shows tiny bilateral pleural effusions with congestive heart failure   Past Medical History:  Diagnosis Date   AAA (abdominal aortic aneurysm) (HCC)    Anticoagulant long-term use    Failed on Coumadin. On Xarelto   Anxiety    Anxiety and depression    Atrial fib/flutter, transient June 2012   Chronic lower back pain    Chronic respiratory failure with hypoxia ([place]) 12/03/2014   COPD (chronic obstructive pulmonary disease) ([place])  DVT (deep venous thrombosis) ([place]) 2004   BLE   Esophageal dysmotility    Exertional dyspnea    Hiatal hernia    History of bronchitis    History of fibrocystic  disease of breast    History of uterine fibroid    HTN (hypertension)    Hyperlipidemia    Hypothyroidism    Normal nuclear stress test 2012   May 2012   OA (osteoarthritis)    "knees; left shoulder"   OSA (obstructive sleep apnea)    "haven't been using my CPAP lately" (01/21/12)   Ovarian mass    right benign   Ovarian mass    benign, right   PAD (peripheral artery disease) ([place])    Small cell carcinoma of lung ([place]) 2004   NON-SMALL CELL CARCINOMA OF THE LUNG, METASTATIC TO THE SUPRACLAVICULAR AND MEDIASTINAL LYMPH NODES; in remission    Past Surgical History:  Procedure Laterality Date   ABDOMINAL AORTIC ANEURYSM REPAIR  ~ 2010   stent graft   BLADDER SURGERY  ~ 2003   sling   BREAST BIOPSY  1960's   both breast's - benign   CARDIOVASCULAR STRESS TEST  2012   No ischemia   CATARACT EXTRACTION W/ INTRAOCULAR LENS  IMPLANT, BILATERAL Bilateral ~ 2009   REPLACEMENT TOTAL KNEE Left ~ 2008   left   THYROIDECTOMY  ~ [place] Medications:  Prior to Admission medications   Medication Sig Start Date End Date Taking? Authorizing Provider  acetaminophen (TYLENOL) 500 MG tablet Take 2 tablets (1,000 mg total) by mouth every 6 (six) hours as needed for fever, headache, mild pain or moderate pain (backpain). 05/09/21  Yes Sreenath, Sudheer B, MD  albuterol (VENTOLIN HFA) 108 (90 Base) MCG/ACT inhaler TAKE 2 PUFFS EVERY 6 HOURS AS NEEDED FORSHORTNESS OF BREATH/WHEEZING 09/10/20  Yes Young, Clinton D, MD  apixaban (ELIQUIS) 5 MG TABS tablet Take 1 tablet (5 mg total) by mouth 2 (two) times daily. 06/27/20  Yes Ball, Bonnie M, MD  Cholecalciferol (VITAMIN D) 1000 UNITS capsule Take 1,000 Units by mouth daily.   Yes [provider]  citalopram (CELEXA) 20 MG tablet Take 20 mg by mouth daily.   Yes [provider]  digoxin (LANOXIN) 0.125 MG tablet Take 0.5 tablets (0.0625 mg total) by mouth daily. 10/30/21  Yes Dunn, Areta Haber, PA-C  furosemide (LASIX) 40 MG tablet Take  2 tablets ( 80 mg ) daily 12/21/20  Yes Ball, Bonnie M, MD  gabapentin (NEURONTIN) 100 MG capsule Take 1 capsule by mouth 3 (three) times daily.   Yes [provider]  ipratropium-albuterol (DUONEB) 0.5-2.5 (3) MG/3ML SOLN Take 3 mLs by nebulization 2 (two) times daily.   Yes [provider]  levalbuterol (XOPENEX HFA) 45 MCG/ACT inhaler Inhale 2 puffs into the lungs every 8 (eight) hours as needed. 10/05/21  Yes [provider]  levothyroxine (SYNTHROID, LEVOTHROID) 88 MCG tablet Take 88 mcg by mouth daily.   Yes [provider]  melatonin 5 MG TABS Take 5 mg by mouth at bedtime.   Yes [provider]  metoprolol tartrate (LOPRESSOR) 25 MG tablet TAKE 1/2 TABLET TWICE DAILY 11/08/20  Yes Ball, Bonnie M, MD  morphine (MSIR) 15 MG tablet Take 0.5 tablets (7.5 mg total) by mouth every 6 (six) hours as needed. 06/25/21  Yes Val Riles, MD  pantoprazole (PROTONIX) 40 MG tablet Take 1 tablet (40 mg total) by mouth daily. 05/10/21  Yes Sidney Ace, MD  potassium chloride SA (KLOR-CON M) 20 MEQ tablet Take 20 mEq by mouth 2 (two) times daily. 07/05/21  Yes [provider]  tiotropium (SPIRIVA HANDIHALER) 18 MCG inhalation capsule Place 1 capsule (18 mcg total) into inhaler and inhale daily. 09/10/20  Yes Young, Tarri Fuller D, MD  vitamin B-12 (CYANOCOBALAMIN) 100 MCG tablet Take 100 mcg by mouth daily.   Yes [provider]  OXYGEN Inhale 4 L into the lungs daily. Patient states she wears 2L at night and 4L when up and moving    [provider]    Inpatient Medications: Scheduled Meds:  Continuous Infusions:  PRN Meds:   Allergies:   No Known Allergies  Social History:   Social History   Socioeconomic History   Marital status: Divorced    Spouse name: Not on file   Number of children: 2   Years of education: Not on file   Highest education level: Not on file  Occupational History   Occupation: retired    Comment:  Chiropractor  Tobacco Use   Smoking status: Former    Packs/day: 1.00    Years: 40.00    Total pack years: 40.00    Types: Cigarettes    Quit date: 06/23/2001    Years since quitting: 20.4   Smokeless tobacco: Never  Vaping Use   Vaping Use: Never used  Substance and Sexual Activity   Alcohol use: Not Currently    Comment: once a week (glass of wine)   Drug use: No   Sexual activity: Never  Other Topics Concern   Not on file  Social History Narrative   Not on file   Social Determinants of Health   Financial Resource Strain: Not on file  Food Insecurity: Not on file  Transportation Needs: Not on file  Physical Activity: Not on file  Stress: Not on file  Social Connections: Not on file  Intimate Partner Violence: Not on file    Family History:    Family History  Problem Relation Age of Onset   Diabetes type II Mother    Diabetes Mother    Hypertension Mother    Heart disease Father    Heart attack Father    Heart disease Sister        See's Dr. Acie Fredrickson   Heart disease Brother        before age 3   Breast cancer Neg Hx      ROS:  Please see the history of present illness.  Review of Systems  Constitutional:  Positive for malaise/fatigue.  HENT: Negative.    Eyes: Negative.   Respiratory:  Positive for shortness of breath.   Cardiovascular:  Positive for leg swelling.  Gastrointestinal: Negative.   Genitourinary: Negative.   Musculoskeletal: Negative.   Skin: Negative.   Neurological:  Positive for weakness.  Endo/Heme/Allergies: Negative.   Psychiatric/Behavioral: Negative.      All other ROS reviewed and negative.     Physical Exam/Data:   Vitals:   12/02/21 1345 12/02/21 1415 12/02/21 1417 12/02/21 1454  BP:      Pulse: 96  84   Resp:      Temp:      TempSrc:      SpO2: 94% 95%  95%  Weight:      Height:        Intake/Output Summary (Last 24 hours) at 12/02/2021 1525 Last data filed at 12/02/2021 1419 Gross per 24 hour  Intake --   Output 1200 ml  Net -1200 ml      12/02/2021   11:08 AM 12/02/2021   10:31 AM 11/14/2021   12:46 PM  Last 3 Weights  Weight (lbs) 190 lb 0.6 oz 190 lb 200 lb 8 oz  Weight (kg) 86.2 kg 86.183 kg 90.946 kg     Body mass index is 37.11 kg/m.  General:  Well nourished, well developed, in no acute distress HEENT: normal Neck: no JVD appreciated  Vascular: No carotid bruits; Distal pulses 2+ bilaterally Cardiac:  normal S1, S2; irregularly irregular; no murmur  Lungs:  Coarse to auscultation bilaterally, forced expiratory wheezing, respirations are unlabored at rest on 6L O2 via [place] Abd: soft, nontender, no hepatomegaly, obese, bowel sounds present in all 4 quadrants  Ext: 2+ pitting edema to bilateral lower extremities Musculoskeletal:  No deformities, BUE and BLE strength normal and equal Skin: warm and dry  Neuro:  CNs 2-12 intact, no focal abnormalities noted Psych:  Normal affect   EKG:  The EKG was personally reviewed and demonstrates:  A-fib/A-fluter with LAFB, rate controlled Telemetry:  Telemetry was personally reviewed and demonstrates:  currently was not on the monitor  Relevant CV Studies: Echocardiogram completed 06/18/2021 1. Left ventricular ejection fraction, by estimation, is 35 to 40%. The  left ventricle has moderately decreased function. The left ventricle  demonstrates global hypokinesis. There is mild left ventricular  hypertrophy. Left ventricular diastolic function   could not be evaluated.   2. Right ventricular systolic function is moderately reduced. The right  ventricular size is normal. There is moderately elevated pulmonary artery  systolic pressure.   3. Left atrial size was moderately dilated.   4. Right atrial size was moderately dilated.   5. The mitral valve is degenerative. Mild mitral valve regurgitation. No  evidence of mitral stenosis. Severe mitral annular calcification.   6. Tricuspid valve regurgitation is moderate.   7. The aortic valve  is tricuspid. There is mild thickening of the aortic  valve. Aortic valve regurgitation is not visualized. Aortic valve  sclerosis is present, with no evidence of aortic valve stenosis.   8. The inferior vena cava is normal in size with greater than 50%  respiratory variability, suggesting right atrial pressure of 3 mmHg.   Laboratory Data:  High Sensitivity Troponin:   Recent Labs  Lab 12/02/21 1215  TROPONINIHS 18*     Chemistry Recent Labs  Lab 12/02/21 1215  NA 139  K 4.2  CL 102  CO2 32  GLUCOSE 122*  BUN 14  CREATININE 0.73  CALCIUM 9.1  MG 2.1  GFRNONAA >60  ANIONGAP 5    No results for input(s): "PROT", "ALBUMIN", "AST", "ALT", "ALKPHOS", "BILITOT" in the last 168 hours. Lipids No results for input(s): "CHOL", "TRIG", "HDL", "LABVLDL", "LDLCALC", "CHOLHDL" in the last 168 hours.  Hematology Recent Labs  Lab 12/02/21 1215  WBC 5.4  RBC 4.55  HGB 15.8*  HCT 48.5*  MCV 106.6*  MCH 34.7*  MCHC 32.6  RDW 13.2  PLT 105*   Thyroid  Recent Labs  Lab 12/02/21 1215  TSH 1.918    BNP Recent Labs  Lab 12/02/21 1215  BNP 367.6*    DDimer No results for input(s): "DDIMER" in the last 168 hours.   Radiology/Studies:  DG Chest 2 View  Result Date: 12/02/2021 CLINICAL DATA:  Shortness of breath, low O2 sats. EXAM: CHEST - 2 VIEW COMPARISON:  07/15/2021 and CT chest 02/14/2020. FINDINGS: Trachea is midline. Heart is enlarged. Thoracic aorta is calcified. Basilar  interstitial prominence and indistinctness. Tiny bilateral pleural effusions. Degenerative changes in the left shoulder. IMPRESSION: Congestive heart failure. Electronically Signed   By: Lorin Picket M.D.   On: 12/02/2021 12:41     Assessment and Plan:   Combined chronic systolic and diastolic CHF       Worsening shortness of breath -BNP 367.6 -out 1.2 L of urine since arrival in the emergency department -patient states that she feels like she is almost back to her baseline -continue lasix  daily -echocardiogram ordered -continue metoprolol -PTA blood pressure medications held to do hypotension -escalate GDMT as tolerated   2. Acute on chronic respiratory failure with hypoxia -baseline oxygen is 4L O2 via [place] -titrate O2 back to baseline as tolerated maintaining  O2 sats 90% or greater     -supportive care -Incentive spirometer   3. Atrial fibrillation/Atrial Flutter with RVR -continue apixaban 5 mg bid -continue metoprolol -continue digoxin  -not felt to be a candidate for amiodarone with underlying pulmonary disease, Tikosyn and sotalol were contraindicated secondary to prolonged QT, and Multaq and flecainide were not good choices due to underlying CHF -rates have been in the 80's   Risk Assessment/Risk Scores:      New York Heart Association (NYHA) Functional Class NYHA Class III   For questions or updates, please contact [facility] Please consult [email or web address] for contact info under    Signed, Monice Lundy, NP  12/02/2021 3:25 PM

## 2021-12-02 NOTE — Progress Notes (Signed)
Subjective:    Patient ID: [name], female    DOB: 1940-09-22, 81 y.o.   MRN: [id number] Patient Care Team: Jinny Sanders, MD as PCP - General (Family Medicine) Martinique, Peter M, MD as PCP - Cardiology (Cardiology) Martinique, Peter M, MD (Cardiology)  Chief Complaint  Patient presents with   Follow-up    HPI Patient is an 81 year old former smoker who has followed previously with Dr. Baird Lyons at [facility] for COPD, chronic respiratory failure with hypoxia, OSA/failed CPAP, history of lung cancer status post XRT/chemotherapy and chronic A-fib and chronic combined systolic and diastolic heart failure.  She presents today wanting to establish at our [place] office as she has recently moved to an assisted living facility in [place].  The patient states however that she has been more breathless this morning.  She presented today feeling weak and dizzy when standing up.  She appeared very tachypneic.  Oxygen saturations on 4 L nasal cannula O2 (baseline) were 84%.  Patient had to be increased to 5 L/min however she requested to go to the bathroom and upon standing saturations dropped to the 70s and she had to be increased to 8 L/min to maintain oxygen saturations above 89%.  Her heart rate was consistent with atrial fibrillation with RVR averaging 125 bpm, blood pressure 90/40.  Patient with significant tachypnea and conversational dyspnea.  Recommendation was for transfer to the ED for further evaluation.   Review of Systems A 10 point review of systems was performed and it is as noted above otherwise negative.  Past Medical History:  Diagnosis Date   AAA (abdominal aortic aneurysm) (HCC)    Anticoagulant long-term use    Failed on Coumadin. On Xarelto   Anxiety    Anxiety and depression    Atrial fib/flutter, transient June 2012   Chronic lower back pain    Chronic respiratory failure with hypoxia ([place]) 12/03/2014   COPD (chronic obstructive pulmonary disease)  ([place])    DVT (deep venous thrombosis) ([place]) 2004   BLE   Esophageal dysmotility    Exertional dyspnea    Hiatal hernia    History of bronchitis    History of fibrocystic disease of breast    History of uterine fibroid    HTN (hypertension)    Hyperlipidemia    Hypothyroidism    Normal nuclear stress test 2012   May 2012   OA (osteoarthritis)    "knees; left shoulder"   OSA (obstructive sleep apnea)    "haven't been using my CPAP lately" (01/21/12)   Ovarian mass    right benign   Ovarian mass    benign, right   PAD (peripheral artery disease) ([place])    Small cell carcinoma of lung ([place]) 2004   NON-SMALL CELL CARCINOMA OF THE LUNG, METASTATIC TO THE SUPRACLAVICULAR AND MEDIASTINAL LYMPH NODES; in remission   Past Surgical History:  Procedure Laterality Date   ABDOMINAL AORTIC ANEURYSM REPAIR  ~ 2010   stent graft   BLADDER SURGERY  ~ 2003   sling   BREAST BIOPSY  1960's   both breast's - benign   CARDIOVASCULAR STRESS TEST  2012   No ischemia   CATARACT EXTRACTION W/ INTRAOCULAR LENS  IMPLANT, BILATERAL Bilateral ~ 2009   REPLACEMENT TOTAL KNEE Left ~ 2008   left   THYROIDECTOMY  ~ 1964   Patient Active Problem List   Diagnosis Date Noted   Osteoarthritis of left shoulder 08/30/2021   Falls 07/15/2021   Acute  on chronic respiratory failure with hypoxia ([place]) 05/04/2021   Frequent UTI    Anticoagulant long-term use    Physical deconditioning 96/22/2979   Chronic systolic CHF (congestive heart failure) ([place]) 09/15/2016   Hyperlipidemia    Chronic respiratory failure with hypoxia (HCC) 12/03/2014   Atrial fibrillation with RVR (HCC)    Multiple thyroid nodules 10/17/2012   AAA (abdominal aortic aneurysm) ([place]) 05/11/2012   Bradycardia 01/23/2012   Hypothyroid 01/21/2012   HTN (hypertension) 01/21/2012   Obstructive sleep apnea 10/16/2008   History of DVT (deep vein thrombosis) 08/19/2008   COPD (chronic obstructive pulmonary disease) ([place]) 08/19/2008   NEOPLASM,  MALIGNANT, LUNG, HX OF 89/21/1941   Diastolic heart failure, NYHA class 2 ([place]) 08/03/2008   Hyperbilirubinemia 2004   Family History  Problem Relation Age of Onset   Diabetes type II Mother    Diabetes Mother    Hypertension Mother    Heart disease Father    Heart attack Father    Heart disease Sister        See's Dr. Acie Fredrickson   Heart disease Brother        before age 18   Breast cancer Neg Hx    Social History   Tobacco Use   Smoking status: Former    Packs/day: 1.00    Years: 40.00    Total pack years: 40.00    Types: Cigarettes    Quit date: 06/23/2001    Years since quitting: 20.4   Smokeless tobacco: Never  Substance Use Topics   Alcohol use: Not Currently    Comment: once a week (glass of wine)   No Known Allergies  Current Meds  Medication Sig   acetaminophen (TYLENOL) 500 MG tablet Take 2 tablets (1,000 mg total) by mouth every 6 (six) hours as needed for fever, headache, mild pain or moderate pain (backpain).   albuterol (VENTOLIN HFA) 108 (90 Base) MCG/ACT inhaler TAKE 2 PUFFS EVERY 6 HOURS AS NEEDED FORSHORTNESS OF BREATH/WHEEZING   apixaban (ELIQUIS) 5 MG TABS tablet Take 1 tablet (5 mg total) by mouth 2 (two) times daily.   Cholecalciferol (VITAMIN D) 1000 UNITS capsule Take 1,000 Units by mouth daily.   citalopram (CELEXA) 20 MG tablet Take 20 mg by mouth daily.   digoxin (LANOXIN) 0.125 MG tablet Take 0.5 tablets (0.0625 mg total) by mouth daily.   furosemide (LASIX) 40 MG tablet Take 2 tablets ( 80 mg ) daily   gabapentin (NEURONTIN) 100 MG capsule Take 1 capsule by mouth 3 (three) times daily.   ipratropium-albuterol (DUONEB) 0.5-2.5 (3) MG/3ML SOLN Take 3 mLs by nebulization 2 (two) times daily.   levalbuterol (XOPENEX HFA) 45 MCG/ACT inhaler Inhale 2 puffs into the lungs every 8 (eight) hours as needed.   levothyroxine (SYNTHROID, LEVOTHROID) 88 MCG tablet Take 88 mcg by mouth daily.   melatonin 5 MG TABS Take 5 mg by mouth at bedtime.   metoprolol  tartrate (LOPRESSOR) 25 MG tablet TAKE 1/2 TABLET TWICE DAILY   morphine (MSIR) 15 MG tablet Take 0.5 tablets (7.5 mg total) by mouth every 6 (six) hours as needed.   OXYGEN Inhale 4 L into the lungs daily. Patient states she wears 2L at night and 4L when up and moving   pantoprazole (PROTONIX) 40 MG tablet Take 1 tablet (40 mg total) by mouth daily.   potassium chloride SA (KLOR-CON M) 20 MEQ tablet Take 20 mEq by mouth 2 (two) times daily.   tiotropium (SPIRIVA HANDIHALER) 18 MCG inhalation capsule  Place 1 capsule (18 mcg total) into inhaler and inhale daily.   vitamin B-12 (CYANOCOBALAMIN) 100 MCG tablet Take 100 mcg by mouth daily.   Immunization History  Administered Date(s) Administered   Fluad Quad(high Dose 65+) 03/15/2020   Influenza Split 03/23/2013, 03/22/2014   Influenza Whole 04/07/2011, 03/23/2012   Influenza, High Dose Seasonal PF 04/23/2016, 03/23/2017, 04/12/2018, 04/15/2019   Influenza,inj,Quad PF,6+ Mos 04/24/2015   Influenza,inj,quad, With Preservative 04/12/2018   Influenza-Unspecified 03/23/2017   PFIZER(Purple Top)SARS-COV-2 Vaccination 07/15/2019, 08/05/2019, 03/27/2020   Pneumococcal Conjugate-13 05/16/2013        Objective:   Physical Exam BP (!) 90/40 (BP Location: Left Arm, Cuff Size: Large)   Pulse (!) 125   Temp 97.8 F (36.6 C) (Temporal)   Ht 5' (1.524 m)   Wt 190 lb (86.2 kg) Comment: per patient  SpO2 94%   BMI 37.11 kg/m patient requiring 6 L/min O2. GENERAL: Obese elderly woman, mild to moderate respiratory distress, mild conversational dyspnea.  Presents in transport chair. HEAD: Normocephalic, atraumatic.  EYES: Pupils equal, round, reactive to light.  No scleral icterus.  MOUTH: Poor dentition, oral mucosa moist. NECK: Supple. No thyromegaly. Trachea midline. No JVD.  No adenopathy. PULMONARY: Good air entry bilaterally.  Faint crackles at the bases.  No wheezes. CARDIOVASCULAR: S1 and S2.  Irregular rate and rhythm with rapid ventricular  response grade 2/6 systolic ejection murmur left sternal border. ABDOMEN: Obese otherwise benign. MUSCULOSKELETAL: No joint deformity, no clubbing, +2-3 edema, indurated, nonpitting.  Significant kyphosis. NEUROLOGIC: No overt localizing sign.  Gait not tested. SKIN: Intact,warm,dry.  Chronic stasis changes bilaterally. PSYCH: Mood and behavior normal.    Assessment & Plan:     ICD-10-CM   1. Acute on chronic respiratory failure with hypoxia (HCC)  J96.21     2. Atrial fibrillation with RVR (HCC)  I48.91     3. Acute on chronic combined systolic (congestive) and diastolic (congestive) heart failure (HCC)  I50.43      Patient appears to have decompensated heart failure.  Recommendation is to have evaluation in the emergency room.  Patient more likely will need admission given increasing O2 requirements, relative hypotension, poor atrial fibrillation control and decompensation of chronic combined systolic and diastolic congestive heart failure.  Long-term would consider consultation with palliative care.  Total visit time 50 minutes.   Renold Don, MD Advanced Bronchoscopy PCCM [facility]    *This note was dictated using voice recognition software/Dragon.  Despite best efforts to proofread, errors can occur which can change the meaning. Any transcriptional errors that result from this process are unintentional and may not be fully corrected at the time of dictation.

## 2021-12-02 NOTE — Assessment & Plan Note (Addendum)
Patient's BMI is 70.96 Complicates overall prognosis and care Lifestyle modification and exercise has been discussed with him in detail.

## 2021-12-02 NOTE — ED Notes (Signed)
Blue tube and 2 tiger tops sent with rest of blood to lab.

## 2021-12-02 NOTE — ED Triage Notes (Signed)
4l/ [place] at PCP's office.. wears home oxygen sats 88%.  Has history of Afib.  HR 120's.

## 2021-12-02 NOTE — Assessment & Plan Note (Addendum)
Patient with a history of atrial flutter who presents to the ER for evaluation of worsening shortness of breath and is noted to have a rapid ventricular rate  continue digoxin and metoprolol for rate control Continue apixaban as primary prophylaxis for an acute stroke Cardiology consult

## 2021-12-02 NOTE — H&P (Signed)
History and Physical    Patient: [name] WGY:[id number] DOB: Sep 21, 1940 DOA: 12/02/2021 DOS: the patient was seen and examined on 12/02/2021 PCP: Jinny Sanders, MD  Patient coming from: Home  Chief Complaint:  Chief Complaint  Patient presents with   Shortness of Breath   HPI: [name] is a 81 y.o. female with medical history significant for COPD with chronic respiratory failure on 4 L of oxygen, history of A. fib on anticoagulation therapy, history of chronic low back pain, anxiety and depression, history of chronic combined systolic and diastolic dysfunction CHF with last known LVEF of 35 to 40% from a 2D echocardiogram that was done in 12/22  who was sent to the ER for evaluation from her pulmonologist office due to increased oxygen requirement, patient at baseline wears 4 L of oxygen but is currently on 6 L as well as relative hypotension. Patient states that she had gone to see her pulmonologist for evaluation of worsening shortness of breath from her baseline and while at the office she had pulse oximetry of 88%.  Heart rate was in the 120s and so she was referred to the ER. During my evaluation at rest she appears short of breath and has conversational dyspnea.  She also has bilateral lower extremity swelling as well as 2 pillow orthopnea but denies having any chest pain, no fever, no chills, no change in her chronic cough, no headache, no abdominal pain, no changes in her bowel habits, no urinary symptoms, no dizziness or lightheadedness. She is currently on 6 L of oxygen with improvement in her pulse oximetry to 92%. She received a dose of Lasix 40 mg IV in the ER   Review of Systems: As mentioned in the history of present illness. All other systems reviewed and are negative. Past Medical History:  Diagnosis Date   AAA (abdominal aortic aneurysm) (HCC)    Anticoagulant long-term use    Failed on Coumadin. On Xarelto   Anxiety    Anxiety and depression    Atrial  fib/flutter, transient June 2012   Chronic lower back pain    Chronic respiratory failure with hypoxia ([place]) 12/03/2014   COPD (chronic obstructive pulmonary disease) ([place])    DVT (deep venous thrombosis) ([place]) 2004   BLE   Esophageal dysmotility    Exertional dyspnea    Hiatal hernia    History of bronchitis    History of fibrocystic disease of breast    History of uterine fibroid    HTN (hypertension)    Hyperlipidemia    Hypothyroidism    Normal nuclear stress test 2012   May 2012   OA (osteoarthritis)    "knees; left shoulder"   OSA (obstructive sleep apnea)    "haven't been using my CPAP lately" (01/21/12)   Ovarian mass    right benign   Ovarian mass    benign, right   PAD (peripheral artery disease) ([place])    Small cell carcinoma of lung ([place]) 2004   NON-SMALL CELL CARCINOMA OF THE LUNG, METASTATIC TO THE SUPRACLAVICULAR AND MEDIASTINAL LYMPH NODES; in remission   Past Surgical History:  Procedure Laterality Date   ABDOMINAL AORTIC ANEURYSM REPAIR  ~ 2010   stent graft   BLADDER SURGERY  ~ 2003   sling   BREAST BIOPSY  1960's   both breast's - benign   CARDIOVASCULAR STRESS TEST  2012   No ischemia   CATARACT EXTRACTION W/ INTRAOCULAR LENS  IMPLANT, BILATERAL Bilateral ~ 2009  REPLACEMENT TOTAL KNEE Left ~ 2008   left   THYROIDECTOMY  ~ 1964   Social History:  reports that she quit smoking about 20 years ago. Her smoking use included cigarettes. She has a 40.00 pack-year smoking history. She has never used smokeless tobacco. She reports that she does not currently use alcohol. She reports that she does not use drugs.  No Known Allergies  Family History  Problem Relation Age of Onset   Diabetes type II Mother    Diabetes Mother    Hypertension Mother    Heart disease Father    Heart attack Father    Heart disease Sister        See's Dr. Acie Fredrickson   Heart disease Brother        before age 28   Breast cancer Neg Hx     Prior to Admission medications    Medication Sig Start Date End Date Taking? Authorizing Provider  acetaminophen (TYLENOL) 500 MG tablet Take 2 tablets (1,000 mg total) by mouth every 6 (six) hours as needed for fever, headache, mild pain or moderate pain (backpain). 05/09/21  Yes Sreenath, Sudheer B, MD  albuterol (VENTOLIN HFA) 108 (90 Base) MCG/ACT inhaler TAKE 2 PUFFS EVERY 6 HOURS AS NEEDED FORSHORTNESS OF BREATH/WHEEZING 09/10/20  Yes Young, Clinton D, MD  apixaban (ELIQUIS) 5 MG TABS tablet Take 1 tablet (5 mg total) by mouth 2 (two) times daily. 06/27/20  Yes [place], Peter M, MD  Cholecalciferol (VITAMIN D) 1000 UNITS capsule Take 1,000 Units by mouth daily.   Yes [provider]  citalopram (CELEXA) 20 MG tablet Take 20 mg by mouth daily.   Yes [provider]  digoxin (LANOXIN) 0.125 MG tablet Take 0.5 tablets (0.0625 mg total) by mouth daily. 10/30/21  Yes Dunn, Areta Haber, PA-C  furosemide (LASIX) 40 MG tablet Take 2 tablets ( 80 mg ) daily 12/21/20  Yes [place], Peter M, MD  gabapentin (NEURONTIN) 100 MG capsule Take 1 capsule by mouth 3 (three) times daily.   Yes [provider]  ipratropium-albuterol (DUONEB) 0.5-2.5 (3) MG/3ML SOLN Take 3 mLs by nebulization 2 (two) times daily.   Yes [provider]  levalbuterol (XOPENEX HFA) 45 MCG/ACT inhaler Inhale 2 puffs into the lungs every 8 (eight) hours as needed. 10/05/21  Yes [provider]  levothyroxine (SYNTHROID, LEVOTHROID) 88 MCG tablet Take 88 mcg by mouth daily.   Yes [provider]  melatonin 5 MG TABS Take 5 mg by mouth at bedtime.   Yes [provider]  metoprolol tartrate (LOPRESSOR) 25 MG tablet TAKE 1/2 TABLET TWICE DAILY 11/08/20  Yes [place], Peter M, MD  morphine (MSIR) 15 MG tablet Take 0.5 tablets (7.5 mg total) by mouth every 6 (six) hours as needed. 06/25/21  Yes Val Riles, MD  pantoprazole (PROTONIX) 40 MG tablet Take 1 tablet (40 mg total) by mouth daily. 05/10/21  Yes Sreenath, Sudheer B, MD   potassium chloride SA (KLOR-CON M) 20 MEQ tablet Take 20 mEq by mouth 2 (two) times daily. 07/05/21  Yes [provider]  tiotropium (SPIRIVA HANDIHALER) 18 MCG inhalation capsule Place 1 capsule (18 mcg total) into inhaler and inhale daily. 09/10/20  Yes Young, Tarri Fuller D, MD  vitamin B-12 (CYANOCOBALAMIN) 100 MCG tablet Take 100 mcg by mouth daily.   Yes [provider]  OXYGEN Inhale 4 L into the lungs daily. Patient states she wears 2L at night and 4L when up and moving    [provider]    Physical Exam: Vitals:   12/02/21 1345 12/02/21 1415 12/02/21 1417 12/02/21 1454  BP:      Pulse: 96  84   Resp:      Temp:      TempSrc:      SpO2: 94% 95%  95%  Weight:      Height:       Physical Exam Vitals and nursing note reviewed.  Constitutional:      Appearance: She is obese.  HENT:     Head: Normocephalic and atraumatic.     Mouth/Throat:     Mouth: Mucous membranes are moist.  Eyes:     Pupils: Pupils are equal, round, and reactive to light.  Cardiovascular:     Rate and Rhythm: Tachycardia present. Rhythm irregular.  Pulmonary:     Effort: Tachypnea present.     Breath sounds: Examination of the right-lower field reveals rales. Examination of the left-lower field reveals rales. Rales present.  Abdominal:     General: Bowel sounds are normal.     Palpations: Abdomen is soft.  Musculoskeletal:     Cervical back: Normal range of motion and neck supple.     Right lower leg: Edema present.     Left lower leg: Edema present.  Skin:    General: Skin is warm and dry.  Neurological:     General: No focal deficit present.     Mental Status: She is alert and oriented to person, place, and time.  Psychiatric:        Mood and Affect: Mood normal.        Behavior: Behavior normal.     Data Reviewed: Relevant notes from primary care and specialist visits, past discharge summaries as available in EHR, including Care Everywhere. Prior diagnostic  testing as pertinent to current admission diagnoses Updated medications and problem lists for reconciliation ED course, including vitals, labs, imaging, treatment and response to treatment Triage notes, nursing and pharmacy notes and ED provider's notes Notable results as noted in HPI Labs reviewed.  Troponin 18, digoxin level 0.7, BNP 367, TSH 1.91, sodium 139, potassium 4.2, chloride 102, bicarb 32, glucose 122, BUN 14, creatinine 0.73, calcium 9.1, magnesium 2.1, white count 5.4, hemoglobin 15.8, hematocrit 48.5, platelet count 105 Respiratory viral panel is negative Chest x-ray reviewed by me shows bilateral pleural effusions Twelve-lead EKG shows atrial flutter with variable AV block There are no new results to review at this time.  Assessment and Plan: Acute on chronic combined systolic and diastolic CHF (congestive heart failure) ([place]) Patient presents for evaluation of worsening shortness of breath from her baseline associated with bilateral lower extremity swelling. She is also tachycardic and has rapid atrial flutter Chest x-ray shows bilateral pleural effusions and BNP is elevated Acute exacerbation of her underlying CHF appears to be rate related Patient's last known LVEF is 35 to 40% from a 2D echocardiogram which was done in December, 2022.  2D echocardiogram also shows LV global hypokinesis, decreased RV systolic function with elevated pulmonary artery systolic pressure. Place patient on Lasix 40mg  IV daily as much as blood pressure tolerates Continue carvedilol Patient not on ACE inhibitors or ARB due to relative hypotension      Acute on chronic respiratory failure with hypoxia (HCC) Most likely secondary to acute on chronic combined systolic and diastolic dysfunction CHF Patient at baseline wears 4 L of oxygen and has increased work of breathing as well as pulse oximetry on 4 L of oxygen of  88% She is currently on 6 L of oxygen We will attempt to wean patient's oxygen  down to her baseline once her acute illness improves or resolves  Atrial flutter with rapid ventricular response ([place]) Patient with a history of atrial flutter who presents to the ER for evaluation of worsening shortness of breath and is noted to have a rapid ventricular rate We will continue digoxin and metoprolol as much as blood pressure tolerates Continue apixaban as primary prophylaxis for an acute stroke  Hypothyroid Stable Continue Synthroid  Obstructive sleep apnea Continue CPAP at bedtime  Chronic low back pain Stable Continue morphine 7.5 mg every 6 hours as needed  Obesity (BMI 30-39.9) Patient's BMI is 22.48 Complicates overall prognosis and care Lifestyle modification and exercise has been discussed with him in detail  Anxiety and depression Stable Continue Celexa      Advance Care Planning:   Code Status: Full Code   Consults: Cardiology, palliative care consult  Family Communication: Greater than 50% of time was spent discussing patient's condition and plan of care with her at the bedside.  All questions and concerns have been addressed.  CODE STATUS was discussed and she wishes to be a full code.  She lists her daughters as her healthcare power of attorney  Severity of Illness: The appropriate patient status for this patient is INPATIENT. Inpatient status is judged to be reasonable and necessary in order to provide the required intensity of service to ensure the patient's safety. The patient's presenting symptoms, physical exam findings, and initial radiographic and laboratory data in the context of their chronic comorbidities is felt to place them at high risk for further clinical deterioration. Furthermore, it is not anticipated that the patient will be medically stable for discharge from the hospital within 2 midnights of admission.   * I certify that at the point of admission it is my clinical judgment that the patient will require inpatient hospital care  spanning beyond 2 midnights from the point of admission due to high intensity of service, high risk for further deterioration and high frequency of surveillance required.*  Author: Collier Bullock, MD 12/02/2021 3:42 PM  For on call review [email or web address].

## 2021-12-02 NOTE — Assessment & Plan Note (Addendum)
Patient presents for evaluation of worsening shortness of breath from her baseline associated with bilateral lower extremity swelling. She is also tachycardic and has rapid atrial flutter Chest x-ray shows bilateral pleural effusions and BNP is elevated Acute exacerbation of her underlying CHF appears to be rate related Patient's last known LVEF is 35 to 40% from a 2D echocardiogram which was done in December, 2022.  2D echocardiogram this admission shows EF of 55 to 60% Place patient on Lasix 40mg  IV daily with consideration of transition to oral diuretic tomorrow Continue carvedilol Patient not on ACE inhibitors or ARB due to relative hypotension Net IO Since Admission: -1,970 mL [12/03/21 1545]

## 2021-12-02 NOTE — ED Notes (Signed)
Pt urinated about 400cc into canister as purewick in place; urine straw yellow and clear.

## 2021-12-02 NOTE — Assessment & Plan Note (Addendum)
Continue Celexa

## 2021-12-02 NOTE — ED Notes (Signed)
Called lab to confirm trop running; they confirmed.

## 2021-12-02 NOTE — ED Triage Notes (Signed)
Pt here from [facility] with SOB. Pt was there to establish a primary with staff noticed her oxygen readings in the low 80s. Pt wears 4L at home. Pt in no visible distress in triage.

## 2021-12-02 NOTE — Assessment & Plan Note (Addendum)
Most likely secondary to acute on chronic combined systolic and diastolic dysfunction CHF Patient at baseline wears 4 L of oxygen and has increased work of breathing as well as pulse oximetry on 4 L of oxygen of 88% She is currently on 6 L of oxygen We will attempt to wean patient's oxygen down to her baseline once her acute illness improves or resolves.  Continue ongoing diuresis with Lasix

## 2021-12-02 NOTE — Assessment & Plan Note (Addendum)
Continue CPAP at bedtime 

## 2021-12-02 NOTE — ED Provider Notes (Signed)
[facility] Provider Note    Event Date/Time   First MD Initiated Contact with Patient 12/02/21 1110     (approximate)   History   Shortness of Breath   HPI  [name] is a 81 y.o. female with a past medical history of DVT, COPD, HTN, HDL, hypothyroidism, anxiety, AAA, chronic hypoxic arterial failure typically on 4 L nasal cannula who presents for further evaluation acute on chronic hypoxia with patient reportedly with SPO2 of 87 to 88% on her chronic 4 L as well as increased shortness of breath from baseline.  Patient denies any chest pain, fevers, significant change in her chronic cough, headache, earache, sore throat, abdominal pain, vomiting, diarrhea, urinary symptoms rash or extremity pain.  She denies orthopnea.  She reports compliance with all her medications including her Eliquis.  She endorses remote tobacco abuse but has not smoked in many years.  No other acute concerns at this time.  She did use her inhaler earlier this morning but does not feel this made a significant difference.    Past Medical History:  Diagnosis Date   AAA (abdominal aortic aneurysm) (HCC)    Anticoagulant long-term use    Failed on Coumadin. On Xarelto   Anxiety    Anxiety and depression    Atrial fib/flutter, transient June 2012   Chronic lower back pain    Chronic respiratory failure with hypoxia ([place]) 12/03/2014   COPD (chronic obstructive pulmonary disease) ([place])    DVT (deep venous thrombosis) ([place]) 2004   BLE   Esophageal dysmotility    Exertional dyspnea    Hiatal hernia    History of bronchitis    History of fibrocystic disease of breast    History of uterine fibroid    HTN (hypertension)    Hyperlipidemia    Hypothyroidism    Normal nuclear stress test 2012   May 2012   OA (osteoarthritis)    "knees; left shoulder"   OSA (obstructive sleep apnea)    "haven't been using my CPAP lately" (01/21/12)   Ovarian mass    right benign   Ovarian mass     benign, right   PAD (peripheral artery disease) ([place])    Small cell carcinoma of lung ([place]) 2004   NON-SMALL CELL CARCINOMA OF THE LUNG, METASTATIC TO THE SUPRACLAVICULAR AND MEDIASTINAL LYMPH NODES; in remission     Physical Exam  Triage Vital Signs: ED Triage Vitals  Enc Vitals Group     BP 12/02/21 1107 107/79     Pulse Rate 12/02/21 1107 (!) 125     Resp 12/02/21 1107 20     Temp 12/02/21 1107 97.9 F (36.6 C)     Temp Source 12/02/21 1107 Oral     SpO2 12/02/21 1107 90 %     Weight 12/02/21 1108 190 lb 0.6 oz (86.2 kg)     Height 12/02/21 1108 5' (1.524 m)     Head Circumference --      Peak Flow --      Pain Score 12/02/21 1108 0     Pain Loc --      Pain Edu? --      Excl. in [place]? --     Most recent vital signs: Vitals:   12/02/21 1417 12/02/21 1454  BP:    Pulse: 84   Resp:    Temp:    SpO2:  95%    General: Awake, no distress.  CV:  Good peripheral perfusion.  2+ radial pulses.  Slightly tachycardic. Resp:  Normal effort.  Diminished but no significant wheezing rhonchi or rales. Abd:  No distention.  Soft. Other:  Lateral lower extremity edema.   ED Results / Procedures / Treatments  Labs (all labs ordered are listed, but only abnormal results are displayed) Labs Reviewed  DIGOXIN LEVEL - Abnormal; Notable for the following components:      Result Value   Digoxin Level 0.7 (*)    All other components within normal limits  CBC WITH DIFFERENTIAL/PLATELET - Abnormal; Notable for the following components:   Hemoglobin 15.8 (*)    HCT 48.5 (*)    MCV 106.6 (*)    MCH 34.7 (*)    Platelets 105 (*)    All other components within normal limits  BASIC METABOLIC PANEL - Abnormal; Notable for the following components:   Glucose, Bld 122 (*)    All other components within normal limits  BRAIN NATRIURETIC PEPTIDE - Abnormal; Notable for the following components:   B Natriuretic Peptide 367.6 (*)    All other components within normal limits  TROPONIN I  (HIGH SENSITIVITY) - Abnormal; Notable for the following components:   Troponin I (High Sensitivity) 18 (*)    All other components within normal limits  SARS CORONAVIRUS 2 BY RT PCR  TSH  MAGNESIUM  PROCALCITONIN     EKG  EKG is remarkable for a flutter with a ventricular rate of 108, left anterior fascicle block, otherwise unremarkable intervals with some nonspecific changes in inferior leads without other clear evidence of acute ischemia or significant arrhythmia.   RADIOLOGY  Chest x-ray on my review shows some early pulm edema with bilateral small pleural effusions.  I do not see a pneumothorax, consolidation, or other clear acute thoracic process.  I did also review radiology interpretation and agree with their findings.   PROCEDURES:  Critical Care performed: No  .1-3 Lead EKG Interpretation  Performed by: Lucrezia Starch, MD Authorized by: Lucrezia Starch, MD     Interpretation: non-specific     ECG rate assessment: tachycardic     Rhythm: atrial flutter     Ectopy: none     Conduction: normal   .Critical Care  Performed by: Lucrezia Starch, MD Authorized by: Lucrezia Starch, MD   Critical care provider statement:    Critical care time (minutes):  30   Critical care was necessary to treat or prevent imminent or life-threatening deterioration of the following conditions:  Respiratory failure   Critical care was time spent personally by me on the following activities:  Development of treatment plan with patient or surrogate, discussions with consultants, evaluation of patient's response to treatment, examination of patient, ordering and review of laboratory studies, ordering and review of radiographic studies, ordering and performing treatments and interventions, pulse oximetry, re-evaluation of patient's condition and review of old charts   The patient is on the cardiac monitor to evaluate for evidence of arrhythmia and/or significant heart rate  changes.   MEDICATIONS ORDERED IN ED: Medications  furosemide (LASIX) injection 40 mg (40 mg Intravenous Given 12/02/21 1410)     IMPRESSION / MDM / ASSESSMENT AND PLAN / ED COURSE  I reviewed the triage vital signs and the nursing notes. Patient's presentation is most consistent with acute presentation with potential threat to life or bodily function.  Differential diagnosis includes, but is not limited to arrhythmia, anemia, metabolic derangements, ACS, pneumonia, CHF, symptomatic effusion.  Suspicion for PE given patient reports compliant with her Eliquis.  EKG is remarkable for a flutter with a ventricular rate of 108, left anterior fascicle block, otherwise unremarkable intervals with some nonspecific changes in inferior leads without other clear evidence of acute ischemia or significant arrhythmia.  Heart and only minimally elevated at 18 which I suspect represents a mild demand ischemia in the setting of CHF with a lower suspicion for an occlusion MI.  Chest x-ray on my review shows some early pulm edema with bilateral small pleural effusions.  I do not see a pneumothorax, consolidation, or other clear acute thoracic process.  I did also review radiology interpretation and agree with their findings.  CBC without leukocytosis or acute anemia.  BMP without any significant electrolyte or metabolic derangements.  TSH WNL.  Digoxin level is low at 0.7.  COVID is negative.  Magnesium is WNL.  BNP slightly elevated 367 all overall.  On imaging and exam appears volume overloaded and I suspect she has acute on chronic heart failure causing her acute on chronic hypoxic respiratory failure.      FINAL CLINICAL IMPRESSION(S) / ED DIAGNOSES   Final diagnoses:  Acute on chronic congestive heart failure, unspecified heart failure type ([place])  Acute respiratory failure with hypoxia ([place])     Rx / DC Orders   ED Discharge Orders     None        Note:  This  document was prepared using Dragon voice recognition software and may include unintentional dictation errors.   Lucrezia Starch, MD 12/02/21 [phone]

## 2021-12-02 NOTE — Telephone Encounter (Signed)
Patient transported to ED due to spo2 86 on 4L HR 125 BP 90/40.   Tiffany with [facility] has been made aware and voiced her understanding.  Nothing further needed.

## 2021-12-02 NOTE — Assessment & Plan Note (Addendum)
TSH 1.9 Continue Synthroid

## 2021-12-02 NOTE — Assessment & Plan Note (Addendum)
Continue morphine 7.5 mg every 6 hours as needed for pain control, also on Neurontin

## 2021-12-03 ENCOUNTER — Inpatient Hospital Stay (HOSPITAL_COMMUNITY)
Admit: 2021-12-03 | Discharge: 2021-12-03 | Disposition: A | Discharge status: Home / Self Care | Payer: Medicare Other | Attending: Cardiology | Admitting: Cardiology

## 2021-12-03 DIAGNOSIS — I5043 Acute on chronic combined systolic (congestive) and diastolic (congestive) heart failure: Secondary | ICD-10-CM | POA: Diagnosis not present

## 2021-12-03 DIAGNOSIS — Z7189 Other specified counseling: Secondary | ICD-10-CM

## 2021-12-03 DIAGNOSIS — G4733 Obstructive sleep apnea (adult) (pediatric): Secondary | ICD-10-CM

## 2021-12-03 DIAGNOSIS — F419 Anxiety disorder, unspecified: Secondary | ICD-10-CM | POA: Diagnosis not present

## 2021-12-03 DIAGNOSIS — Z789 Other specified health status: Secondary | ICD-10-CM

## 2021-12-03 DIAGNOSIS — I5033 Acute on chronic diastolic (congestive) heart failure: Secondary | ICD-10-CM | POA: Diagnosis not present

## 2021-12-03 DIAGNOSIS — M545 Low back pain, unspecified: Secondary | ICD-10-CM

## 2021-12-03 DIAGNOSIS — E039 Hypothyroidism, unspecified: Secondary | ICD-10-CM | POA: Diagnosis not present

## 2021-12-03 DIAGNOSIS — Z515 Encounter for palliative care: Secondary | ICD-10-CM

## 2021-12-03 DIAGNOSIS — G8929 Other chronic pain: Secondary | ICD-10-CM

## 2021-12-03 DIAGNOSIS — J9621 Acute and chronic respiratory failure with hypoxia: Secondary | ICD-10-CM | POA: Diagnosis not present

## 2021-12-03 DIAGNOSIS — F32A Depression, unspecified: Secondary | ICD-10-CM

## 2021-12-03 DIAGNOSIS — I4892 Unspecified atrial flutter: Secondary | ICD-10-CM | POA: Diagnosis not present

## 2021-12-03 LAB — ECHOCARDIOGRAM COMPLETE
AR max vel: 2.08 cm2
AV Area VTI: 2.01 cm2
AV Area mean vel: 2.04 cm2
AV Mean grad: 2 mmHg
AV Peak grad: 4.7 mmHg
Ao pk vel: 1.09 m/s
Area-P 1/2: 4.24 cm2
Height: 60 in
MV VTI: 1.77 cm2
S' Lateral: 2.3 cm
Weight: 3248.7 oz

## 2021-12-03 LAB — BASIC METABOLIC PANEL
Anion gap: 4 — ABNORMAL LOW (ref 5–15)
BUN: 16 mg/dL (ref 8–23)
CO2: 32 mmol/L (ref 22–32)
Calcium: 9 mg/dL (ref 8.9–10.3)
Chloride: 102 mmol/L (ref 98–111)
Creatinine, Ser: 0.71 mg/dL (ref 0.44–1.00)
GFR, Estimated: >60 mL/min (ref >60)
Glucose, Bld: 104 mg/dL — ABNORMAL HIGH (ref 70–99)
Potassium: 4.1 mmol/L (ref 3.5–5.1)
Sodium: 138 mmol/L (ref 135–145)

## 2021-12-03 LAB — CBC
HCT: 44.9 % (ref 36.0–46.0)
Hemoglobin: 14.7 g/dL (ref 12.0–15.0)
MCH: 34.3 pg — ABNORMAL HIGH (ref 26.0–34.0)
MCHC: 32.7 g/dL (ref 30.0–36.0)
MCV: 104.7 fL — ABNORMAL HIGH (ref 80.0–100.0)
Platelets: 97 10*3/uL — ABNORMAL LOW (ref 150–400)
RBC: 4.29 MIL/uL (ref 3.87–5.11)
RDW: 13.1 % (ref 11.5–15.5)
WBC: 4.7 10*3/uL (ref 4.0–10.5)
nRBC: 0 % (ref 0.0–0.2)

## 2021-12-03 NOTE — Consult Note (Cosign Needed Addendum)
Consultation Note Date: 12/03/2021   Patient Name: [name]  DOB: May 18, 1941  MRN: [id number]  Age / Sex: 81 y.o., female  PCP: Jinny Sanders, MD Referring Physician: Max Sane, MD  Reason for Consultation: Establishing goals of care  HPI/Patient Profile: 81 y.o. female  with past medical history of CHF, COPD (4L), A-fib (Xarelto), chronic low back pain, anxiety, depression, OSA, OA, PAD, non-small cell cancer of the lung (remission since 2004), and hypothyroidism admitted on 12/02/2021 with CHF exacerbation.   Clinical Assessment and Goals of Care: I have reviewed medical records including EPIC notes, labs and imaging, assessed the patient and then met with patient  to discuss diagnosis prognosis, GOC, EOL wishes, disposition and options.  I introduced Palliative Medicine as specialized medical care for people living with serious illness. It focuses on providing relief from the symptoms and stress of a serious illness. The goal is to improve quality of life for both the patient and the family.  We discussed a brief life review of the patient.  She worked as a Sales promotion account executive.  She married and divorced this a man who is also the father of her 2 children, both daughters.  She has 3 grandchildren, 2 that live locally and 1 that lives in [place].  When asked what she likes to do for fun, she says she likes to do "nothing".  She says she worked hard all of her life and mostly had 2 jobs all the time.  So now that she is 71 she really wants to do nothing - and she enjoys that.  As far as functional and nutritional status patient reports that she is able to get around with her rolling walker.  She endorses some difficulty with traveling long distances.  She denies any issues with nutrition and has a healthy appetite.  We discussed patient's current illness and what it means in the larger  context of patient's on-going co-morbidities.  Natural disease trajectory was discussed.  Patient has clear understanding of CHF and COPD being chronic, irreversible diseases.  She understands that she has to monitor herself with daily weights, adhere to medication regimen, restrict the use of salt and fluid intake daily.  Advance directives and surrogate decision makers discussed.  Patient was clear that she would want both of her daughters to be the decision makers in the event that she is unable to speak for herself.  She has no formal document in place but wishes to create one.  Spiritual care consult described and referral placed for creation of AD.  CODE STATUS, CODE BLUE, ACLS protocol, and intubation with ventilator use were discussed in detail.  Patient was clear that she would want to remain a full code and be placed on a ventilator if medically necessary.  She also shared she would not want to live on a ventilator long-term.  She states that if her doctors think that she has no chance of recovery then she would want to have the ventilator removed.  Discussed with  patient the importance of continued conversation with family and the medical providers regarding overall plan of care and treatment options, ensuring decisions are within the context of the patient's values and GOCs.    Questions and concerns were addressed. The family was encouraged to call with questions or concerns.    Goals are clear.  Full code and full scope remain.  Patient is accepting of all offered and available appropriate medical interventions to sustain her life - with the exception that she would not want to be sustained long-term on a ventilator.  Surrogate decision makers are to be both of her daughters. Spiritual care to facilitate creation of AD.   Symptoms are well controlled at this time.  Patient has no acute complaints.  Patient is almost back to baseline nasal cannula supplemental oxygen use.  PMT will  continue to follow the patient throughout her hospitalization and intervene if goals change, at patient/family's request, or if patient's health declines.  Primary Decision Maker PATIENT  Code Status/Advance Care Planning: Full code  Prognosis:   > 12 months  Discharge Planning: Return to [facility]  Primary Diagnoses: Present on Admission:  Acute on chronic combined systolic and diastolic CHF (congestive heart failure) (HCC)  Hypothyroid  Obstructive sleep apnea  Atrial flutter with rapid ventricular response (HCC)  Chronic low back pain  Obesity (BMI 30-39.9)  Acute on chronic respiratory failure with hypoxia (HCC)  Anxiety and depression   Physical Exam Vitals reviewed.  Constitutional:      General: She is not in acute distress.    Appearance: She is well-developed. She is not ill-appearing or toxic-appearing.  HENT:     Head: Normocephalic and atraumatic.  Cardiovascular:     Rate and Rhythm: Rhythm irregular.  Pulmonary:     Comments: Conversational dyspnea, no accessory muscle use Abdominal:     Palpations: Abdomen is soft.  Skin:    General: Skin is warm and dry.  Neurological:     Mental Status: She is alert and oriented to person, place, and time.  Psychiatric:        Mood and Affect: Mood normal. Mood is not anxious.        Behavior: Behavior normal. Behavior is not agitated.     Palliative Assessment/Data: 50%     I discussed this patient's plan of care with patient, RN LaShara.  Thank you for this consult. Palliative medicine will continue to follow and assist holistically.   Time Total: 75 minutes Greater than 50%  of this time was spent counseling and coordinating care related to the above assessment and plan.  Signed by: Jordan Hawks, DNP, FNP-BC Palliative Medicine    Please contact Palliative Medicine Team phone at [phone] for questions and concerns.  For individual provider: See Shea Evans

## 2021-12-03 NOTE — Progress Notes (Signed)
Progress Note   Patient: [name] LKG:[id number] DOB: Oct 31, 1940 DOA: 12/02/2021     1 DOS: the patient was seen and examined on 12/03/2021   Brief hospital course: 81 y.o. female with medical history significant for COPD with chronic respiratory failure on 4 L of oxygen, history of A. fib on anticoagulation therapy, history of chronic low back pain, anxiety and depression, history of chronic combined systolic and diastolic dysfunction CHF with last known LVEF of 35 to 40% from a 2D echocardiogram that was done in 12/22  who was sent to the ER for evaluation from her pulmonologist office due to increased oxygen requirement, patient at baseline wears 4 L of oxygen but is currently on 6 L as well as relative hypotension  6/13: Cardiology and palliative care consult     Assessment and Plan: * Acute on chronic combined systolic and diastolic CHF (congestive heart failure) ([place]) Patient presents for evaluation of worsening shortness of breath from her baseline associated with bilateral lower extremity swelling. She is also tachycardic and has rapid atrial flutter Chest x-ray shows bilateral pleural effusions and BNP is elevated Acute exacerbation of her underlying CHF appears to be rate related Patient's last known LVEF is 35 to 40% from a 2D echocardiogram which was done in December, 2022.  2D echocardiogram this admission shows EF of 55 to 60% Place patient on Lasix 40mg  IV daily with consideration of transition to oral diuretic tomorrow Continue carvedilol Patient not on ACE inhibitors or ARB due to relative hypotension Net IO Since Admission: -1,970 mL [12/03/21 1545]      Acute on chronic respiratory failure with hypoxia (HCC) Most likely secondary to acute on chronic combined systolic and diastolic dysfunction CHF Patient at baseline wears 4 L of oxygen and has increased work of breathing as well as pulse oximetry on 4 L of oxygen of 88% She is currently on 6 L of oxygen We  will attempt to wean patient's oxygen down to her baseline once her acute illness improves or resolves.  Continue ongoing diuresis with Lasix  Atrial flutter with rapid ventricular response ([place]) Patient with a history of atrial flutter who presents to the ER for evaluation of worsening shortness of breath and is noted to have a rapid ventricular rate  continue digoxin and metoprolol for rate control Continue apixaban as primary prophylaxis for an acute stroke Cardiology consult  Hypothyroid TSH 1.9 Continue Synthroid  Obstructive sleep apnea Continue CPAP at bedtime.  Chronic low back pain Continue morphine 7.5 mg every 6 hours as needed for pain control, also on Neurontin  Obesity (BMI 30-39.9) Patient's BMI is 66.44 Complicates overall prognosis and care Lifestyle modification and exercise has been discussed with him in detail.  Goals of care, counseling/discussion Palliative care consult  Anxiety and depression Continue Celexa  Physical deconditioning We will consult PT, OT        Subjective: No chest pain or worsening shortness of breath.  She does feel somewhat improved compared to when she first came in  Physical Exam: Vitals:   12/03/21 0526 12/03/21 0836 12/03/21 1125 12/03/21 1534  BP: 104/64 98/64 108/81 101/64  Pulse: 87 82 (!) 102 97  Resp: 18 18 18 18   Temp: 98.2 F (36.8 C) 98 F (36.7 C) 97.8 F (36.6 C) 98.1 F (36.7 C)  TempSrc:    Oral  SpO2: (!) 89% 92% 90% 92%  Weight:      Height:       81 year old female lying in  the bed comfortably without any acute distress Lungs decreased breath sounds at the bases.  No rales or rhonchi Cardiovascular irregularly irregular heart sounds, no murmur or rubs or gallops Abdomen soft, benign Extremities 2+ pitting edema.  Venous stasis discoloration present Neuro alert and oriented, nonfocal Psych normal mood and affect  Data Reviewed:  Echo shows EF of 55 to 60%  Family Communication:  None  Disposition: Status is: Inpatient Remains inpatient appropriate because: Ongoing diuresis for CHF exacerbation   Planned Discharge Destination: Home with Home Health    DVT prophylaxis-Eliquis Time spent: 35 minutes  Author: Max Sane, MD 12/03/2021 3:46 PM  For on call review [email or web address].

## 2021-12-03 NOTE — Progress Notes (Signed)
Pt does not want to use CPAP, pt aware one can be made available if needed.

## 2021-12-03 NOTE — Hospital Course (Addendum)
81 y.o. female with medical history significant for COPD with chronic respiratory failure on 4 L of oxygen, history of A. fib on anticoagulation therapy, history of chronic low back pain, anxiety and depression, history of chronic combined systolic and diastolic dysfunction CHF with last known LVEF of 35 to 40% from a 2D echocardiogram that was done in 12/22  who was sent to the ER for evaluation from her pulmonologist office due to increased oxygen requirement, patient at baseline wears 4 L of oxygen but is currently on 6 L as well as relative hypotension  6/13: Cardiology and palliative care consulted 6/14: remains on 6 L/min HFNC O2, IV >> PO diuresis 6/15: O2 needs up to 8 L this AM, CXR no infiltrate, back to IV diuressis

## 2021-12-03 NOTE — Progress Notes (Signed)
*  PRELIMINARY RESULTS* Echocardiogram 2D Echocardiogram has been performed.  [name] 12/03/2021, 10:05 AM

## 2021-12-03 NOTE — Assessment & Plan Note (Signed)
Palliative care consult

## 2021-12-03 NOTE — Consult Note (Signed)
   Heart Failure Nurse Navigator Note  HFpEF 55 to 60% by echocardiogram performed on this admission ,previously had been reported at 35 to 40%.  It also shows mild left ventricular hypertrophy.  Diastolic parameters are indeterminate.  Right ventricular systolic function is mildly reduced.  The right ventricle is moderately enlarged.  There is severely elevated pulmonary artery systolic pressures.  Moderate biatrial enlargement.  Tricuspid regurgitation is moderate to severe.  Patient was sent from her 61 office when there she appears to have decompensated heart failure.  She had noticed increasing shortness of breath two-pillow orthopnea, bilateral lower extremity edema.  Comorbidities:  COPD Atrial fibrillation/flutter Anxiety depression Chronic use of O2 at 4 L Osteoarthritis  Medications:  Eliquis 5 mg 2 times a day Digoxin 0.625 mg daily Furosemide 40 mg IV daily Metoprolol tartrate 12.5 mg  2 times a day Potassium chloride 20 mEq 2 times a day  Labs:  Sodium 138, potassium 4.1, chloride 102, CO2 32, BUN 16, creatinine 0.71, BNP 367, hemoglobin 14.7 hematocrit 44.9. Intake not documented Output 1350 mL Weight 92.1 kg   Initial meeting with patient who was lying quietly in bed in no acute distress.  Discussed heart failure and what it means.  She states that she lives at an assisted living facility and up until about a month ago the nurses were weighing her daily and then it stopped a states that she no longer needed daily weights.   Discussed the importance of daily weights and reporting weight gains to her physician.  Also discussed changes in and symptoms to report to physician to aid in remaining out of the hospital.  Martin Majestic over fluid restriction and sodium restriction.  Patient states that she does not use salt and did not feel that she took in any more than eight 8 ounce cups in a days time.  She states that she has been compliant with her  medications.  She was last admitted in January 2023.  She has follow-up in the outpatient heart failure clinic on July 5 at 1 PM.  She has a 1% no-show which is 1 out of 143 appointments.  She was given the living with heart failure teaching booklet, zone magnet, weight chart, information on low-sodium and heart failure.  She had no further questions and we will continue to follow along.  Pricilla Riffle RN CHFN

## 2021-12-03 NOTE — Progress Notes (Signed)
Progress Note  Patient Name: [name] Date of Encounter: 12/03/2021  [facility] Cardiologist: Peter Martinique, MD   Subjective   Patient was seen on AM rounds. Denies any chest pain or worsening shortness of breath. -1.6L output since admission. Continues on 6L O2 via [place].   Inpatient Medications    Scheduled Meds:  apixaban  5 mg Oral BID   cholecalciferol  1,000 Units Oral Daily   citalopram  20 mg Oral Daily   digoxin  0.0625 mg Oral Daily   furosemide  40 mg Intravenous Daily   gabapentin  100 mg Oral TID   levothyroxine  88 mcg Oral Daily   melatonin  5 mg Oral QHS   metoprolol tartrate  12.5 mg Oral BID   pantoprazole  40 mg Oral Daily   potassium chloride SA  20 mEq Oral BID   vitamin B-12  100 mcg Oral Daily   Continuous Infusions:  PRN Meds: acetaminophen, ipratropium-albuterol, morphine, ondansetron **OR** ondansetron (ZOFRAN) IV   Vital Signs    Vitals:   12/03/21 0500 12/03/21 0526 12/03/21 0836 12/03/21 1125  BP:  104/64 98/64 108/81  Pulse:  87 82 (!) 102  Resp:  18 18 18   Temp:  98.2 F (36.8 C) 98 F (36.7 C) 97.8 F (36.6 C)  TempSrc:      SpO2:  (!) 89% 92% 90%  Weight: 92.1 kg     Height:        Intake/Output Summary (Last 24 hours) at 12/03/2021 1412 Last data filed at 12/03/2021 1120 Gross per 24 hour  Intake 240 ml  Output 1050 ml  Net -810 ml      12/03/2021    5:00 AM 12/03/2021   12:28 AM 12/02/2021   11:08 AM  Last 3 Weights  Weight (lbs) 203 lb 0.7 oz 203 lb 0.7 oz 190 lb 0.6 oz  Weight (kg) 92.1 kg 92.1 kg 86.2 kg      Telemetry    Rate controlled atrial fibrillation 90-100- Personally Reviewed  ECG    No new tracings- Personally Reviewed  Physical Exam   GEN: No acute distress. Resting in bed with eyes closed Neck: No JVD appreciated Cardiac: irregularly irregular, no murmurs, rubs, or gallops.  Respiratory: Clear in upper lobes with diminished bases to auscultation bilaterally without wheezes, rhonchi,  or rales. Respirations are unlabored at rest on 6L O2 via [place] GI: Soft, nontender, non-distended, obese, bowel sounds present in all 4 quadrants MS: 2+ pitting edema; No deformity. Venous status discoloration noted.  Neuro:  Nonfocal  Psych: Normal affect   Labs    High Sensitivity Troponin:   Recent Labs  Lab 12/02/21 1215  TROPONINIHS 18*     Chemistry Recent Labs  Lab 12/02/21 1215 12/03/21 0611  NA 139 138  K 4.2 4.1  CL 102 102  CO2 32 32  GLUCOSE 122* 104*  BUN 14 16  CREATININE 0.73 0.71  CALCIUM 9.1 9.0  MG 2.1  --   GFRNONAA >60 >60  ANIONGAP 5 4*    Lipids No results for input(s): "CHOL", "TRIG", "HDL", "LABVLDL", "LDLCALC", "CHOLHDL" in the last 168 hours.  Hematology Recent Labs  Lab 12/02/21 1215 12/03/21 0611  WBC 5.4 4.7  RBC 4.55 4.29  HGB 15.8* 14.7  HCT 48.5* 44.9  MCV 106.6* 104.7*  MCH 34.7* 34.3*  MCHC 32.6 32.7  RDW 13.2 13.1  PLT 105* 97*   Thyroid  Recent Labs  Lab 12/02/21 1215  TSH  1.918    BNP Recent Labs  Lab 12/02/21 1215  BNP 367.6*    DDimer No results for input(s): "DDIMER" in the last 168 hours.   Radiology     Cardiac Studies  Echocardiogram completed on 12/03/2021 1. Left ventricular ejection fraction, by estimation, is 55 to 60%. The  left ventricle has normal function. Left ventricular endocardial border  not optimally defined to evaluate regional wall motion. There is mild left  ventricular hypertrophy. Left  ventricular diastolic parameters are indeterminate.   2. Right ventricular systolic function is mildly reduced. The right  ventricular size is moderately enlarged. There is severely elevated  pulmonary artery systolic pressure.   3. Left atrial size was moderately dilated.   4. Right atrial size was moderately dilated.   5. The mitral valve is degenerative. Trivial mitral valve regurgitation.  Moderate mitral annular calcification.   6. The tricuspid valve is abnormal. Tricuspid valve regurgitation  is  moderate to severe.   7. The aortic valve is tricuspid. There is mild thickening of the aortic  valve. Aortic valve regurgitation is not visualized. Aortic valve  sclerosis is present, with no evidence of aortic valve stenosis.   8. The inferior vena cava is dilated in size with <50% respiratory  variability, suggesting right atrial pressure of 15 mmHg.   Patient Profile     81 y.o. female with a history of chronic combined systolic and diastolic congestive heart failure, permanent atrial fibrillation on apixaban, essential tension, non-small cell lung cancer with metastasis to the supraclavicular 2004 lower extremity DVT, hypothyroidism, chronic hypoxic respiratory failure on chronic home O2, COPD, AAA status post aortic biiliac stent graft, and descending thoracic aortic aneurysm measuring 3.3 cm followed by vascular surgery who is being seen and evaluated for combined systolic and diastolic congestive heart failure exacerbation.  Assessment & Plan    1.  Combined chronic systolic and diastolic congestive heart failure -Improved shortness of breath -BNP was 367.6 -Patient negative 1.6 L output since admission -Continue 40 mg of Lasix IVP daily -Possibly transition  to oral diuretics tomorrow -Recommend changing to torsemide on discharge as she takes Lasix 80 mg twice daily at the nursing facility.  Continue to have worsening shortness of breath that required elevation even the she has no missed doses medication -Echocardiogram completed shows EF 55 to 60% -Continue metoprolol for rate control -Escalate goal-directed medical therapy as tolerated -Low blood pressures precludes addition of goal-directed at this time, we can consider an SGLT2 inhibitor although with her she is possibly high risk for yeast infections -PTA blood pressure medications are on hold due to softer blood pressures  2.  Acute on chronic respiratory failure with hypoxia -Baseline oxygen is 4 L O2 via nasal cannula  requirement in the hospital has remained at 6 L since admission -Titrate FiO2 back to baseline as tolerated maintain O2 sats greater than or equal to 90% -Work on pulmonary toileting with incentive spirometer and deep breathing -Supportive care  3.  Permanent atrial fibrillation/atrial flutter with RVR on admission -Continue apixaban 5 mg twice daily -Continue metoprolol -Continue dig -Patient is not felt to be a candidate for amiodarone with underlying pulmonary disease, Tikosyn and sotalol contraindicated secondary to prolonged QT, and Multaq and flecainide are not good choices due to underlying chronic combined systolic and diastolic congestive heart failure -Atrial fibrillation has been rate controlled     For questions or updates, please contact [place] Please consult [email or web address] for contact info under  Signed, Chaynce Schafer, NP  12/03/2021, 2:12 PM

## 2021-12-03 NOTE — Assessment & Plan Note (Signed)
We will consult PT, OT

## 2021-12-04 DIAGNOSIS — D696 Thrombocytopenia, unspecified: Secondary | ICD-10-CM

## 2021-12-04 DIAGNOSIS — I4821 Permanent atrial fibrillation: Secondary | ICD-10-CM | POA: Diagnosis not present

## 2021-12-04 DIAGNOSIS — I5033 Acute on chronic diastolic (congestive) heart failure: Secondary | ICD-10-CM | POA: Diagnosis not present

## 2021-12-04 DIAGNOSIS — I5043 Acute on chronic combined systolic (congestive) and diastolic (congestive) heart failure: Secondary | ICD-10-CM | POA: Diagnosis not present

## 2021-12-04 LAB — BASIC METABOLIC PANEL
Anion gap: 5 (ref 5–15)
BUN: 18 mg/dL (ref 8–23)
CO2: 33 mmol/L — ABNORMAL HIGH (ref 22–32)
Calcium: 9.2 mg/dL (ref 8.9–10.3)
Chloride: 100 mmol/L (ref 98–111)
Creatinine, Ser: 0.71 mg/dL (ref 0.44–1.00)
GFR, Estimated: >60 mL/min (ref >60)
Glucose, Bld: 99 mg/dL (ref 70–99)
Potassium: 4 mmol/L (ref 3.5–5.1)
Sodium: 138 mmol/L (ref 135–145)

## 2021-12-04 LAB — CBC
HCT: 45.2 % (ref 36.0–46.0)
Hemoglobin: 14.8 g/dL (ref 12.0–15.0)
MCH: 34.9 pg — ABNORMAL HIGH (ref 26.0–34.0)
MCHC: 32.7 g/dL (ref 30.0–36.0)
MCV: 106.6 fL — ABNORMAL HIGH (ref 80.0–100.0)
Platelets: 95 10*3/uL — ABNORMAL LOW (ref 150–400)
RBC: 4.24 MIL/uL (ref 3.87–5.11)
RDW: 13.1 % (ref 11.5–15.5)
WBC: 5.1 10*3/uL (ref 4.0–10.5)
nRBC: 0 % (ref 0.0–0.2)

## 2021-12-04 MED ORDER — TORSEMIDE 20 MG PO TABS: 40.0000 mg | ORAL_TABLET | Freq: Two times a day (BID) | ORAL | Status: DC

## 2021-12-04 MED ORDER — ENSURE ENLIVE PO LIQD
237.0000 mL | Freq: Two times a day (BID) | ORAL | Status: DC
  Administered 2021-12-04 – 2021-12-06 (×4): 237 mL via ORAL

## 2021-12-04 MED ORDER — TORSEMIDE 20 MG PO TABS
40.0000 mg | ORAL_TABLET | Freq: Two times a day (BID) | ORAL | Status: DC
  Administered 2021-12-04 – 2021-12-05 (×2): 40 mg via ORAL
  Filled 2021-12-04 (×2): qty 2

## 2021-12-04 MED ORDER — ADULT MULTIVITAMIN W/MINERALS CH
1.0000 | ORAL_TABLET | Freq: Every day | ORAL | Status: DC
  Administered 2021-12-04 – 2021-12-09 (×6): 1 via ORAL
  Filled 2021-12-04 (×6): qty 1

## 2021-12-04 NOTE — Assessment & Plan Note (Signed)
Continue CPAP at bedtime 

## 2021-12-04 NOTE — Assessment & Plan Note (Signed)
Palliative care consulted

## 2021-12-04 NOTE — Assessment & Plan Note (Addendum)
Presented with worsening shortness of breath from her baseline, progressive bilateral lower extremity swelling. Was also tachycardic with rapid atrial flutter. Chest x-ray shows bilateral pleural effusions and BNP is elevated Acute exacerbation of her underlying CHF appears to be rate related. Echo this admission shows EF of 55 to 60%. --Cardiology managing -- Treated IV Lasix, was transitioned to oral torsemide 40 mg BID 6/14, but resumed on IV Lasix BID 6/15 due to persistent signs of volume overload and increased oxygen requirement --IV Lasix increased to TID (thru 6/18) --Transition to p.o. torsemide 40 mg twice daily --Continue carvedilol (hold if soft BP) --Not on ACEI or ARB due to relative hypotension Net IO Since Admission: -8,685 mL [12/08/21 1528]

## 2021-12-04 NOTE — Assessment & Plan Note (Addendum)
Platelets have been slowly downtrending 105 >> 97 >> 95 >> 112 >> 101 -- Monitor CBC

## 2021-12-04 NOTE — Progress Notes (Signed)
Initial Nutrition Assessment  DOCUMENTATION CODES:   Obesity unspecified  INTERVENTION:   -Ensure Enlive po BID, each supplement provides 350 kcal and 20 grams of protein -MVI with minerals daily  NUTRITION DIAGNOSIS:   Inadequate oral intake related to decreased appetite as evidenced by per patient/family report.  GOAL:   Patient will meet greater than or equal to 90% of their needs  MONITOR:   PO intake, Supplement acceptance  REASON FOR ASSESSMENT:   Rounds    ASSESSMENT:   Pt with medical history significant for COPD with chronic respiratory failure on 4 L of oxygen, history of A. fib on anticoagulation therapy, history of chronic low back pain, anxiety and depression, history of chronic combined systolic and diastolic dysfunction CHF who presents due to increased oxygen requirement.  Pt admitted with CHF.   Reviewed I/O's: -1.5 L x 24 hours and -2.8 L since admission  UOP: 2.1 L x 24 hours   Spoke with pt, who was sitting in recliner chair at time of visit. She reports poor appetite since admission, but is unsure why. Noted meal completion 0-75%.   PTA pt explains that she lives in an ALF where she is provided 3 meals per day. She also consumes about 4 cups of water and soda daily. Pt often gets food delivered to her room and does not have a choice in what she eats, but will sometimes order the alternate item (sandwich) if she does not like what is served. Pt reports she only goes to the dining room a few days a week, as those who sit at her table are very HOH. Pt also has withdrawn from activities such as Bingo, but attributes this to having her daughter visiting from [place].   Pt reports she has lived in [place] since July. She reports she lost 10# upon admission to facility but has since regained lost weight. Pt reports that she was being weighed daily at the facility, but this was discontinued about a month ago. Pt is unsure of UBW. Reviewed wt hx; no wt loss noted.    Discussed importance of good meal and supplement intake to promote healing. Pt amenable to supplements.   Medications reviewed and include vitamin D3, potassium chloride, demadex, and vitamin B-12.   Labs reviewed: Na: 129.    NUTRITION - FOCUSED PHYSICAL EXAM:  Flowsheet Row Most Recent Value  Orbital Region No depletion  Upper Arm Region Mild depletion  Thoracic and Lumbar Region No depletion  Buccal Region No depletion  Temple Region No depletion  Clavicle Bone Region No depletion  Clavicle and Acromion Bone Region No depletion  Scapular Bone Region No depletion  Dorsal Hand No depletion  Patellar Region No depletion  Anterior Thigh Region No depletion  Posterior Calf Region No depletion  Edema (RD Assessment) Mild  Hair Reviewed  Eyes Reviewed  Mouth Reviewed  Skin Reviewed  Nails Reviewed       Diet Order:   Diet Order             Diet 2 gram sodium Room service appropriate? Yes; Fluid consistency: Thin  Diet effective now                   EDUCATION NEEDS:   Education needs have been addressed  Skin:  Skin Assessment: Reviewed RN Assessment  Last BM:  12/01/21  Height:   Ht Readings from Last 1 Encounters:  12/03/21 5' (1.524 m)    Weight:   Wt Readings from Last  1 Encounters:  12/04/21 92.7 kg    Ideal Body Weight:  45.5 kg  BMI:  Body mass index is 39.91 kg/m.  Estimated Nutritional Needs:   Kcal:  1600-1800  Protein:  75-90 grams  Fluid:  > 1.6 L    Loistine Chance, RD, LDN, [place] Registered Dietitian II Certified Diabetes Care and Education Specialist Please refer to [facility] for RD and/or RD on-call/weekend/after hours pager

## 2021-12-04 NOTE — TOC Initial Note (Signed)
Transition of Care [facility]) - Initial/Assessment Note    Patient Details  Name: [name] MRN: [id number] Date of Birth: 16-Nov-1940  Transition of Care [facility]) CM/SW Contact:    Laurena Slimmer, RN Phone Number: 12/04/2021, 10:26 AM  Clinical Narrative:                  Transition of Care [facility]) Screening Note   Patient Details  Name: [name] Date of Birth: May 10, 1941   Transition of Care [facility]) CM/SW Contact:    Laurena Slimmer, RN Phone Number: 12/04/2021, 10:28 AM    Transition of Care Department (TOC) has reviewed patient and no TOC needs have been identified at this time. We will continue to monitor patient advancement through interdisciplinary progression rounds. If new patient transition needs arise, please place a TOC consult.          Patient Goals and CMS Choice        Expected Discharge Plan and Services                                                Prior Living Arrangements/Services                       Activities of Daily Living Home Assistive Devices/Equipment: Environmental consultant (specify type), Cane (specify quad or straight) ADL Screening (condition at time of admission) Patient's cognitive ability adequate to safely complete daily activities?: Yes Is the patient deaf or have difficulty hearing?: No Does the patient have difficulty seeing, even when wearing glasses/contacts?: No Does the patient have difficulty concentrating, remembering, or making decisions?: No Patient able to express need for assistance with ADLs?: Yes Does the patient have difficulty dressing or bathing?: Yes Independently performs ADLs?: No Communication: Independent Dressing (OT): Needs assistance Is this a change from baseline?: Pre-admission baseline Grooming: Needs assistance Is this a change from baseline?: Pre-admission baseline Feeding: Independent Bathing: Needs assistance Is this a change from baseline?: Pre-admission baseline Toileting:  Independent with device (comment) In/Out Bed: Independent with device (comment) Walks in Home: Independent with device (comment) Does the patient have difficulty walking or climbing stairs?: Yes Weakness of Legs: Both Weakness of Arms/Hands: Left  Permission Sought/Granted                  Emotional Assessment              Admission diagnosis:  Acute respiratory failure with hypoxia (HCC) [J96.01] Acute on chronic diastolic CHF (congestive heart failure) (HCC) [I50.33] Acute on chronic combined systolic and diastolic CHF (congestive heart failure) ([place]) [I50.43] Acute on chronic congestive heart failure, unspecified heart failure type ([place]) [I50.9] Patient Active Problem List   Diagnosis Date Noted   Goals of care, counseling/discussion 12/03/2021   Acute on chronic heart failure with preserved ejection fraction (HFpEF) ([place]) 12/02/2021   Acute on chronic combined systolic and diastolic CHF (congestive heart failure) ([place]) 12/02/2021   Atrial flutter with rapid ventricular response ([place]) 12/02/2021   Chronic low back pain 12/02/2021   Obesity (BMI 30-39.9) 12/02/2021   Anxiety and depression    Osteoarthritis of left shoulder 08/30/2021   Falls 07/15/2021   Acute on chronic respiratory failure with hypoxia ([place]) 05/04/2021   Frequent UTI    Anticoagulant long-term use    Physical deconditioning 40/98/1191   Chronic systolic CHF (  congestive heart failure) ([place]) 09/15/2016   Hyperlipidemia    Chronic respiratory failure with hypoxia ([place]) 12/03/2014   Atrial fibrillation with RVR ([place])    Multiple thyroid nodules 10/17/2012   AAA (abdominal aortic aneurysm) ([place]) 05/11/2012   Bradycardia 01/23/2012   Hypothyroid 01/21/2012   HTN (hypertension) 01/21/2012   Obstructive sleep apnea 10/16/2008   History of DVT (deep vein thrombosis) 08/19/2008   COPD (chronic obstructive pulmonary disease) ([place]) 08/19/2008   NEOPLASM, MALIGNANT, LUNG, HX OF 14/70/9295   Diastolic  heart failure, NYHA class 2 ([place]) 08/03/2008   Hyperbilirubinemia 2004   PCP:  Jinny Sanders, MD Pharmacy:   [facility], [place] [address] Phone: [phone] Fax: [phone]  [id number] [place], [address] [place] [address] Phone: [phone] Fax: [phone]  [id number] [place], [place] [address] [place] [address] Phone: [phone] Fax: [phone]  [place], [place] - [address] [place] [address] Phone: [phone] Fax: [phone]     Social Determinants of Health (SDOH) Interventions    Readmission Risk Interventions     No data to display

## 2021-12-04 NOTE — Assessment & Plan Note (Addendum)
Most likely secondary to acute on chronic combined systolic and diastolic dysfunction CHF. Requires 4 liters per minute O2 at baseline.  Presented with increased work of breathing and SPO2 88% on usual 4 L. 6/15: Requiring 6 >> 8 >> 10 L/min O2 6/16: Improved oxygen requirement weaned to her baseline 4 L since yesterday --Continue 4 L/min [place] O2 --Supplemental O2 as needed, target sats 90 to 94% --Manage CHF as outlined with diuresis

## 2021-12-04 NOTE — Progress Notes (Signed)
Progress Note  Patient Name: [name] Date of Encounter: 12/04/2021  [facility] Cardiologist: Peter Martinique, MD   Subjective   Patient was seen on a.m. rounds today.  She denies any chest pain or shortness of breath.  She states that she feels as if she needs clearance of bacteremia baseline.  She remains on 6 L O2 nasal cannula.  Of note her baseline is 4 L O2 via [place].  She had -1.4 L out in the last 24 hours  Inpatient Medications    Scheduled Meds:  apixaban  5 mg Oral BID   cholecalciferol  1,000 Units Oral Daily   citalopram  20 mg Oral Daily   digoxin  0.0625 mg Oral Daily   feeding supplement  237 mL Oral BID BM   gabapentin  100 mg Oral TID   levothyroxine  88 mcg Oral Daily   melatonin  5 mg Oral QHS   metoprolol tartrate  12.5 mg Oral BID   multivitamin with minerals  1 tablet Oral Daily   pantoprazole  40 mg Oral Daily   potassium chloride SA  20 mEq Oral BID   torsemide  40 mg Oral BID   vitamin B-12  100 mcg Oral Daily   Continuous Infusions:  PRN Meds: acetaminophen, ipratropium-albuterol, morphine, ondansetron **OR** ondansetron (ZOFRAN) IV   Vital Signs    Vitals:   12/04/21 0424 12/04/21 0813 12/04/21 1102 12/04/21 1229  BP:  95/73  102/74  Pulse:  72  (!) 105  Resp:  17  16  Temp:  (!) 97.5 F (36.4 C)  (!) 97.5 F (36.4 C)  TempSrc:      SpO2:  95% 95% 94%  Weight: 92.7 kg     Height:        Intake/Output Summary (Last 24 hours) at 12/04/2021 1429 Last data filed at 12/04/2021 1223 Gross per 24 hour  Intake 120 ml  Output 2200 ml  Net -2080 ml      12/04/2021    4:24 AM 12/03/2021    5:00 AM 12/03/2021   12:28 AM  Last 3 Weights  Weight (lbs) 204 lb 5.9 oz 203 lb 0.7 oz 203 lb 0.7 oz  Weight (kg) 92.7 kg 92.1 kg 92.1 kg      Telemetry    Rate controlled A-fib with aberrancy- Personally Reviewed  ECG    No new tracings- Personally Reviewed  Physical Exam   GEN: No acute distress.  Resting in the recliner Neck: No  JVD Cardiac: Irregularly irregular rate and rhythm, no murmurs, rubs, or gallops.  Respiratory: Clear in the upper lobes with diminished bases to auscultation bilaterally.  Respirations are unlabored at rest on 6 L O2 via nasal cannula. GI: Soft, nontender, non-distended, obese, bowel sounds present in all 4 quadrants MS: 2+ pitting edema to bilateral lower extremities; No deformity.  Venous stasis discoloration noted to bilateral lower extremities Neuro:  Nonfocal  Psych: Normal affect   Labs    High Sensitivity Troponin:   Recent Labs  Lab 12/02/21 1215  TROPONINIHS 18*     Chemistry Recent Labs  Lab 12/02/21 1215 12/03/21 0611 12/04/21 0607  NA 139 138 138  K 4.2 4.1 4.0  CL 102 102 100  CO2 32 32 33*  GLUCOSE 122* 104* 99  BUN 14 16 18   CREATININE 0.73 0.71 0.71  CALCIUM 9.1 9.0 9.2  MG 2.1  --   --   GFRNONAA >60 >60 >60  ANIONGAP 5 4*  5    Lipids No results for input(s): "CHOL", "TRIG", "HDL", "LABVLDL", "LDLCALC", "CHOLHDL" in the last 168 hours.  Hematology Recent Labs  Lab 12/02/21 1215 12/03/21 0611 12/04/21 0607  WBC 5.4 4.7 5.1  RBC 4.55 4.29 4.24  HGB 15.8* 14.7 14.8  HCT 48.5* 44.9 45.2  MCV 106.6* 104.7* 106.6*  MCH 34.7* 34.3* 34.9*  MCHC 32.6 32.7 32.7  RDW 13.2 13.1 13.1  PLT 105* 97* 95*   Thyroid  Recent Labs  Lab 12/02/21 1215  TSH 1.918    BNP Recent Labs  Lab 12/02/21 1215  BNP 367.6*    DDimer No results for input(s): "DDIMER" in the last 168 hours.   Radiology     Cardiac Studies  Echocardiogram completed 12/03/2021 1. Left ventricular ejection fraction, by estimation, is 55 to 60%. The  left ventricle has normal function. Left ventricular endocardial border  not optimally defined to evaluate regional wall motion. There is mild left  ventricular hypertrophy. Left  ventricular diastolic parameters are indeterminate.   2. Right ventricular systolic function is mildly reduced. The right  ventricular size is moderately  enlarged. There is severely elevated  pulmonary artery systolic pressure.   3. Left atrial size was moderately dilated.   4. Right atrial size was moderately dilated.   5. The mitral valve is degenerative. Trivial mitral valve regurgitation.  Moderate mitral annular calcification.   6. The tricuspid valve is abnormal. Tricuspid valve regurgitation is  moderate to severe.   7. The aortic valve is tricuspid. There is mild thickening of the aortic  valve. Aortic valve regurgitation is not visualized. Aortic valve  sclerosis is present, with no evidence of aortic valve stenosis.   8. The inferior vena cava is dilated in size with <50% respiratory  variability, suggesting right atrial pressure of 15 mmHg.   Patient Profile     81 y.o. female with history of chronic combined systolic and diastolic congestive heart failure, permanent atrial fibrillation on apixaban, essential hypertension, none small cell lung cancer with metastasis to the supraclavicular lymph nodes in 2004, history of lower extremity DVT, hypothyroidism, chronic hypoxic respiratory failure on chronic home O2, COPD, and AAA status post aortic biiliac stent graft and descending thoracic aortic aneurysm measuring 3.3 cm followed by vascular surgery who is being seen and evaluated for combined systolic and diastolic congestive heart failure exacerbation.  Assessment & Plan    Combined chronic systolic and diastolic congestive heart failure -Improved shortness of breath -Remains on 6 L O2, titrate FiO2 back to baseline of 4 as tolerated maintaining O2 sats greater than equal to 90% -BNP of 367.6 -Discontinue IV Lasix today -Daily BMP due to diuretics -Started oral torsemide 40 mg twice daily, recommending discharging on torsemide versus furosemide -Echocardiogram completed shows EF 55 to 60% -Escalate GDMT as tolerated -Daily weight, strict I&O, low-sodium foods  2.  Acute on chronic respiratory failure with hypoxia -Baseline  oxygen is 4 L O2 via nasal cannula requirement in the hospital has remained at 6 L since admission -Noted improved breathing this morning after nebulizer therapy -Continue to work with pulmonary toileting incentive spirometer and deep breathing -Supportive care  3.  Permanent atrial fibrillation/atrial flutter with RVR on admission -Continue apixaban 5 mg twice daily -Continue metoprolol for rate control -Continue PTA dig -Patient is not felt to be a candidate for amiodarone with underlying pulmonary disease, Tikosyn and sotalol contraindicated secondary to prolonged QT, and Multaq and flecainide are not the choices due to underlying chronic  combined systolic and diastolic congestive heart failure -She remains A-fib rate controlled on telemetry      For questions or updates, please contact [place] Please consult [email or web address] for contact info under        Signed, Akoni Parton, NP  12/04/2021, 2:29 PM

## 2021-12-04 NOTE — Plan of Care (Signed)
  Problem: Clinical Measurements: Goal: Ability to maintain clinical measurements within normal limits will improve 12/04/2021 0441 by Otilio Jefferson, RN Outcome: Progressing 12/04/2021 0257 by Otilio Jefferson, RN Outcome: Progressing   Problem: Clinical Measurements: Goal: Will remain free from infection 12/04/2021 0441 by Otilio Jefferson, RN Outcome: Progressing 12/04/2021 0257 by Otilio Jefferson, RN Outcome: Progressing

## 2021-12-04 NOTE — Assessment & Plan Note (Signed)
Patient's BMI is 07.68 Complicates overall prognosis and care Lifestyle modification and exercise has been discussed with him in detail.

## 2021-12-04 NOTE — Plan of Care (Signed)
  Problem: Clinical Measurements: Goal: Ability to maintain clinical measurements within normal limits will improve Outcome: Progressing   Problem: Clinical Measurements: Goal: Will remain free from infection Outcome: Progressing   Problem: Clinical Measurements: Goal: Diagnostic test results will improve Outcome: Progressing   

## 2021-12-04 NOTE — Progress Notes (Signed)
Pt continues to refuse CPAP. No distress noted.

## 2021-12-04 NOTE — Assessment & Plan Note (Signed)
TSH 1.9 Continue Synthroid

## 2021-12-04 NOTE — Assessment & Plan Note (Signed)
Continue Celexa

## 2021-12-04 NOTE — Progress Notes (Signed)
Progress Note   Patient: [name] [id number]:[id number] DOB: 1940/12/25 DOA: 12/02/2021     2 DOS: the patient was seen and examined on 12/04/2021   Brief hospital course: 81 y.o. female with medical history significant for COPD with chronic respiratory failure on 4 L of oxygen, history of A. fib on anticoagulation therapy, history of chronic low back pain, anxiety and depression, history of chronic combined systolic and diastolic dysfunction CHF with last known LVEF of 35 to 40% from a 2D echocardiogram that was done in 12/22  who was sent to the ER for evaluation from her pulmonologist office due to increased oxygen requirement, patient at baseline wears 4 L of oxygen but is currently on 6 L as well as relative hypotension  6/13: Cardiology and palliative care consulted 6/14: remains on 6 L/min HFNC O2, on IV diuresis    Assessment and Plan: * Acute on chronic combined systolic and diastolic CHF (congestive heart failure) ([place]) Presented with worsening shortness of breath from her baseline, progressive bilateral lower extremity swelling. Was also tachycardic with rapid atrial flutter. Chest x-ray shows bilateral pleural effusions and BNP is elevated Acute exacerbation of her underlying CHF appears to be rate related. Echo in December 2022 with LVEF is 35 to 40%. Echo this admission shows EF of 55 to 60%. --Status post IV Lasix  -- Transitioned to oral torsemide 40 mg twice daily --Continue carvedilol --Not on ACEI or ARB due to relative hypotension Net IO Since Admission: -3,450 mL [12/04/21 1410]      Acute on chronic respiratory failure with hypoxia (HCC) Most likely secondary to acute on chronic combined systolic and diastolic dysfunction CHF. Requires 4 liters per minute O2 at baseline.  Presented with increased work of breathing and SPO2 88% on usual 4 L. --Currently still on 6 L of oxygen --Oxygen as tolerated, target sats 90 to 94% --Manage CHF as outlined with  diuresis  Atrial flutter with rapid ventricular response ([place]) Patient with a history of atrial flutter, presented with worsening shortness of breath was found in a flutter with RVR. --Continue digoxin and metoprolol  -- Cardiology following --Continue Eliquis  Hypothyroid TSH 1.9 Continue Synthroid  Obstructive sleep apnea Continue CPAP at bedtime.  Chronic low back pain Continue morphine 7.5 mg every 6 hours as needed for pain control, also on Neurontin  Obesity (BMI 30-39.9) Patient's BMI is 75.64 Complicates overall prognosis and care Lifestyle modification and exercise has been discussed with him in detail.  Thrombocytopenia (HCC) Platelets have been slowly downtrending past few days, 105 >> 97 >> 95 -- Monitor CBC  Goals of care, counseling/discussion Palliative care consulted  Anxiety and depression Continue Celexa  Acute on chronic heart failure with preserved ejection fraction (HFpEF) ([place]) As outlined  Physical deconditioning PT and OT recommending home health. TOC following.        Subjective: Patient was up in recliner when seen on rounds today.  She reports not having any bowel movement since Sunday.  Otherwise reports breathing feels okay, better than when she first came in.  Reports good urine output with diuretics.  No other acute complaints at this time.  Confirms she typically needs 4 L/min oxygen  Physical Exam: Vitals:   12/04/21 0424 12/04/21 0813 12/04/21 1102 12/04/21 1229  BP:  95/73  102/74  Pulse:  72  (!) 105  Resp:  17  16  Temp:  (!) 97.5 F (36.4 C)  (!) 97.5 F (36.4 C)  TempSrc:  SpO2:  95% 95% 94%  Weight: 92.7 kg     Height:       General exam: awake, alert, no acute distress, obese, chronically ill-appearing HEENT: moist mucus membranes, hearing grossly normal  Respiratory system: Lungs clear bilaterally with diminished bases, no wheezes, normal respiratory effort at rest on 6 L/min HFNC O2. Cardiovascular system:  normal S1/S2, RRR, unable to visualize JVD, improving lower extremity edema as evidenced by early skin wrinkling.   Gastrointestinal system: soft, NT, ND, hypoactive bowel sounds. Central nervous system: A&O x3. no gross focal neurologic deficits, normal speech Extremities: moves all, mild lower extremity venous stasis changes, normal tone Skin: dry, intact, normal temperature, no rashes seen on visualized skin Psychiatry: normal mood, congruent affect, judgement and insight appear normal   Data Reviewed:  Notable labs:Bicarb 33 otherwise BMP unremarkable BC notable for platelets 95, downtrending  Family Communication: None at bedside but did call  Disposition: Status is: Inpatient Remains inpatient appropriate because: Remains with oxygen requirement higher than her baseline, requires further clinical improvement and stable volume status on oral diuretic prior to discharge   Planned Discharge Destination: Home with Home Health    Time spent: 40 minutes  Author: Ezekiel Slocumb, DO 12/04/2021 2:19 PM  For on call review [email or web address].

## 2021-12-04 NOTE — Assessment & Plan Note (Signed)
Continue morphine 7.5 mg every 6 hours as needed for pain control, also on Neurontin

## 2021-12-04 NOTE — Assessment & Plan Note (Signed)
Patient with a history of atrial flutter, presented with worsening shortness of breath was found in a flutter with RVR. --Continue digoxin and metoprolol  -- Cardiology following --Continue Eliquis

## 2021-12-04 NOTE — Assessment & Plan Note (Signed)
As outlined

## 2021-12-04 NOTE — Evaluation (Signed)
Physical Therapy Evaluation Patient Details Name: [name] MRN: [id number] DOB: 1941-01-25 Today's Date: 12/04/2021  History of Present Illness  [name] is an 58yoF who comes to [facility] on 12/02/21 after worse repiratory status at pulmonology apointment.  PMH: DVT, COPD, HTN, HDL, hypothyroidism, anxiety, AAA, chronic hypoxic arterial failure typically on 4 L nasal cannula. Pt admitted in acute CHF exacerbation adn ARF c hypoxia. Pt resides at [facility].  Clinical Impression  Pt admitted with above diagnosis. Pt currently with functional limitations due to the deficits listed below (see "PT Problem List"). Upon entry, pt in bed, awake and agreeable to participate. The pt is alert, pleasant, interactive, and able to provide info regarding prior level of function, both in tolerance and independence. Pt 95% on 6L resting (4L baseline) but after transfer to recliner, does not rebound from desat to 86-87%, requires increased floe rate to 7L HFNC. Patient's performance this date reveals decreased ability, independence, and tolerance in performing all basic mobility required for performance of activities of daily living. Pt requires additional DME, close physical assistance, and cues for safe participate in mobility. Pt will benefit from skilled PT intervention to increase independence and safety with basic mobility in preparation for discharge to the venue listed below.        Recommendations for follow up therapy are one component of a multi-disciplinary discharge planning process, led by the attending physician.  Recommendations may be updated based on patient status, additional functional criteria and insurance authorization.  Follow Up Recommendations Home health PT    Assistance Recommended at Discharge Intermittent Supervision/Assistance  Patient can return home with the following  A little help with walking and/or transfers;A little help with bathing/dressing/bathroom;Assist for  transportation;Assistance with cooking/housework    Equipment Recommendations None recommended by PT  Recommendations for Other Services       Functional Status Assessment Patient has had a recent decline in their functional status and demonstrates the ability to make significant improvements in function in a reasonable and predictable amount of time.     Precautions / Restrictions Precautions Precautions: Fall Restrictions Weight Bearing Restrictions: No      Mobility  Bed Mobility Overal bed mobility: Independent             General bed mobility comments: moving quite well, strength is good; om 6L SpO2 in 90s%    Transfers Overall transfer level: Needs assistance Equipment used: Rolling walker (2 wheels) Transfers: Sit to/from Stand, Bed to chair/wheelchair/BSC Sit to Stand: Supervision Stand pivot transfers: Min guard (extensive cues for safe line management, on 6L, SpO2 drops to 86-87% with out any recovery, pt bumped to 7L/min)         General transfer comment: require smax effort and 3 attempts to rise to standing, no dizziness, no LOB, safe use of RW noted    Ambulation/Gait Ambulation/Gait assistance:  (deferred due to high O2 needs, slow recovery)                Stairs            Wheelchair Mobility    Modified Rankin (Stroke Patients Only)       Balance                                             Pertinent Vitals/Pain Pain Assessment Pain Assessment: No/denies pain    Home  Living Family/patient expects to be discharged to:: Assisted living [facility])                 Home Equipment: Toilet riser;Grab bars - tub/shower;Grab bars - toilet;Shower seat - built in;Rollator (4 wheels) Additional Comments: Pt lives at [facility]- floor concentrator in room and half sized O2 tanks when out of room with rollator. 4L at rest baseline, does not increase with activity.    Prior Function Prior Level of  Function : Needs assist;History of Falls (last six months)             Mobility Comments: Ind amb in her room without AD, Mod Ind amb with a rollator facility distances, uses facility w/c for MD apts ADLs Comments: Assist from staff with bathing, meals, and meds; mostly Ind with dressing with occassional assist secondary to L shoulder limitations     Hand Dominance        Extremity/Trunk Assessment   Upper Extremity Assessment Upper Extremity Assessment: Generalized weakness;Overall [facility] for tasks assessed    Lower Extremity Assessment Lower Extremity Assessment: Generalized weakness;Overall WFL for tasks assessed       Communication   Communication: No difficulties  Cognition Arousal/Alertness: Awake/alert Behavior During Therapy: WFL for tasks assessed/performed Overall Cognitive Status: Within Functional Limits for tasks assessed                                          General Comments      Exercises     Assessment/Plan    PT Assessment Patient needs continued PT services  PT Problem List Decreased strength;Decreased range of motion;Decreased activity tolerance;Decreased balance;Decreased mobility       PT Treatment Interventions Therapeutic exercise;Therapeutic activities;Functional mobility training;Patient/family education;Gait training;DME instruction;Balance training;Neuromuscular re-education    PT Goals (Current goals can be found in the Care Plan section)  Acute Rehab PT Goals Patient Stated Goal: regain baseline respiratory status PT Goal Formulation: With patient Time For Goal Achievement: 12/18/21 Potential to Achieve Goals: Fair    Frequency       Co-evaluation               AM-PAC PT "6 Clicks" Mobility  Outcome Measure Help needed turning from your back to your side while in a flat bed without using bedrails?: None Help needed moving from lying on your back to sitting on the side of a flat bed without using  bedrails?: None Help needed moving to and from a bed to a chair (including a wheelchair)?: A Little Help needed standing up from a chair using your arms (e.g., wheelchair or bedside chair)?: A Little Help needed to walk in hospital room?: A Little Help needed climbing 3-5 steps with a railing? : A Lot 6 Click Score: 19    End of Session Equipment Utilized During Treatment: Oxygen Activity Tolerance: Patient tolerated treatment well;No increased pain;Treatment limited secondary to medical complications (Comment) Patient left: in chair;with nursing/sitter in room;with chair alarm set Nurse Communication: Mobility status PT Visit Diagnosis: Other abnormalities of gait and mobility (R26.89);Difficulty in walking, not elsewhere classified (R26.2);Muscle weakness (generalized) (M62.81)    Time: 2683-4196 PT Time Calculation (min) (ACUTE ONLY): 28 min   Charges:   PT Evaluation $PT Eval Low Complexity: 1 Low PT Treatments $Therapeutic Exercise: 8-22 mins       1:06 PM, 12/04/21 Etta Grandchild, PT, DPT Physical Therapist -  [facility]  [phone] ([place])    Jarmarcus Wambold C 12/04/2021, 1:04 PM

## 2021-12-04 NOTE — Assessment & Plan Note (Addendum)
PT and OT recommending home health. TOC following.  Patient from [facility] assisted living.

## 2021-12-04 NOTE — Evaluation (Signed)
Occupational Therapy Evaluation Patient Details Name: [name] MRN: [id number] DOB: 1941/03/12 Today's Date: 12/04/2021   History of Present Illness Caleigha Zale is an 52yoF who comes to [facility] on 12/02/21 after worse repiratory status at pulmonology apointment.  PMH: DVT, COPD, HTN, HDL, hypothyroidism, anxiety, AAA, chronic hypoxic arterial failure typically on 4 L nasal cannula. Pt admitted in acute CHF exacerbation adn ARF c hypoxia. Pt resides at [facility].   Clinical Impression   Patient presenting with decreased Ind in self care, balance, functional mobility/transfers, endurance, and safety awareness. Patient reports living at [facility] and utilizing rollator for mobility or furniture walking. She has assistance for bathing and dressing tasks at baseline and is on 4Ls via [place] at baseline. Pt does appear to fatigue very quickly but does move fairly well with min A. She is limited by endurance this session and needing increased rest breaks for pursed lip breathing.  Patient will benefit from acute OT to increase overall independence in the areas of ADLs, functional mobility, and safety awareness in order to safely discharge to next venue of care.      Recommendations for follow up therapy are one component of a multi-disciplinary discharge planning process, led by the attending physician.  Recommendations may be updated based on patient status, additional functional criteria and insurance authorization.   Follow Up Recommendations  Home health OT    Assistance Recommended at Discharge Intermittent Supervision/Assistance  Patient can return home with the following A little help with walking and/or transfers;A lot of help with bathing/dressing/bathroom;Assistance with cooking/housework;Assist for transportation    Functional Status Assessment  Patient has had a recent decline in their functional status and demonstrates the ability to make significant improvements in function in a  reasonable and predictable amount of time.  Equipment Recommendations  None recommended by OT       Precautions / Restrictions Precautions Precautions: Fall Restrictions Weight Bearing Restrictions: No      Mobility Bed Mobility               General bed mobility comments: seated in recliner chair    Transfers Overall transfer level: Needs assistance Equipment used: Rolling walker (2 wheels) Transfers: Sit to/from Stand, Bed to chair/wheelchair/BSC Sit to Stand: Supervision Stand pivot transfers: Min guard                    ADL either performed or assessed with clinical judgement   ADL Overall ADL's : Needs assistance/impaired                         Toilet Transfer: Magazine features editor Details (indicate cue type and reason): simulated           General ADL Comments: Pt reports having mod A for bathing and dressing at baseline and is close to baseline with these tasks but is experiencing decreased endurance.     Vision Patient Visual Report: No change from baseline              Pertinent Vitals/Pain Pain Assessment Pain Assessment: No/denies pain        Extremity/Trunk Assessment Upper Extremity Assessment Upper Extremity Assessment: Generalized weakness   Lower Extremity Assessment Lower Extremity Assessment: Generalized weakness       Communication Communication Communication: No difficulties   Cognition Arousal/Alertness: Awake/alert Behavior During Therapy: WFL for tasks assessed/performed Overall Cognitive Status: Within Functional Limits for tasks assessed  Home Living Family/patient expects to be discharged to:: Assisted living                             Home Equipment: Toilet riser;Grab bars - tub/shower;Grab bars - toilet;Shower seat - built in;Rollator (4 wheels)   Additional Comments: Pt lives at [facility]- floor  concentrator in room and half sized O2 tanks when out of room with rollator. 4L at rest baseline, does not increase with activity.      Prior Functioning/Environment Prior Level of Function : Needs assist;History of Falls (last six months)             Mobility Comments: Pt reports furniture walking in room or transferring to seat on rollator and pushing self with feet. She also reports staff push her in rollator. ADLs Comments: Staff assist with bathing, meds, and meals.        OT Problem List: Decreased strength;Decreased activity tolerance;Decreased safety awareness;Impaired balance (sitting and/or standing);Decreased knowledge of use of DME or AE      OT Treatment/Interventions: Self-care/ADL training;Balance training;Therapeutic exercise;Therapeutic activities;DME and/or AE instruction;Manual therapy;Patient/family education;Energy conservation    OT Goals(Current goals can be found in the care plan section) Acute Rehab OT Goals Patient Stated Goal: to go home OT Goal Formulation: With patient Time For Goal Achievement: 12/18/21 Potential to Achieve Goals: Good ADL Goals Pt Will Perform Grooming: with modified independence;standing Pt Will Perform Lower Body Dressing: with min assist;sit to/from stand Pt Will Transfer to Toilet: with supervision;ambulating Pt Will Perform Toileting - Clothing Manipulation and hygiene: with supervision;sit to/from stand  OT Frequency: Min 2X/week       AM-PAC OT "6 Clicks" Daily Activity     Outcome Measure Help from another person eating meals?: None Help from another person taking care of personal grooming?: A Little Help from another person toileting, which includes using toliet, bedpan, or urinal?: A Little Help from another person bathing (including washing, rinsing, drying)?: A Lot Help from another person to put on and taking off regular upper body clothing?: None Help from another person to put on and taking off regular lower body  clothing?: A Lot 6 Click Score: 18   End of Session Equipment Utilized During Treatment: Rolling walker (2 wheels) Nurse Communication: Mobility status  Activity Tolerance: Patient tolerated treatment well Patient left: in bed;with call bell/phone within reach;with bed alarm set  OT Visit Diagnosis: Repeated falls (R29.6);Muscle weakness (generalized) (M62.81)                Time: 4562-5638 OT Time Calculation (min): 20 min Charges:  OT General Charges $OT Visit: 1 Visit OT Evaluation $OT Eval Moderate Complexity: 1 Mod OT Treatments $Therapeutic Activity: 8-22 mins  Darleen Crocker, MS, OTR/L , CBIS ascom [phone]  12/04/21, 1:47 PM

## 2021-12-05 ENCOUNTER — Inpatient Hospital Stay: Payer: Medicare Other

## 2021-12-05 DIAGNOSIS — I5043 Acute on chronic combined systolic (congestive) and diastolic (congestive) heart failure: Secondary | ICD-10-CM | POA: Diagnosis not present

## 2021-12-05 DIAGNOSIS — J9601 Acute respiratory failure with hypoxia: Secondary | ICD-10-CM | POA: Diagnosis not present

## 2021-12-05 DIAGNOSIS — J9621 Acute and chronic respiratory failure with hypoxia: Secondary | ICD-10-CM | POA: Diagnosis not present

## 2021-12-05 DIAGNOSIS — R5381 Other malaise: Secondary | ICD-10-CM

## 2021-12-05 DIAGNOSIS — I4892 Unspecified atrial flutter: Secondary | ICD-10-CM | POA: Diagnosis not present

## 2021-12-05 LAB — BASIC METABOLIC PANEL
Anion gap: 4 — ABNORMAL LOW (ref 5–15)
BUN: 21 mg/dL (ref 8–23)
CO2: 36 mmol/L — ABNORMAL HIGH (ref 22–32)
Calcium: 9.1 mg/dL (ref 8.9–10.3)
Chloride: 97 mmol/L — ABNORMAL LOW (ref 98–111)
Creatinine, Ser: 0.76 mg/dL (ref 0.44–1.00)
GFR, Estimated: >60 mL/min (ref >60)
Glucose, Bld: 114 mg/dL — ABNORMAL HIGH (ref 70–99)
Potassium: 4 mmol/L (ref 3.5–5.1)
Sodium: 137 mmol/L (ref 135–145)

## 2021-12-05 LAB — CBC
HCT: 45.3 % (ref 36.0–46.0)
Hemoglobin: 14.7 g/dL (ref 12.0–15.0)
MCH: 34.1 pg — ABNORMAL HIGH (ref 26.0–34.0)
MCHC: 32.5 g/dL (ref 30.0–36.0)
MCV: 105.1 fL — ABNORMAL HIGH (ref 80.0–100.0)
Platelets: 112 10*3/uL — ABNORMAL LOW (ref 150–400)
RBC: 4.31 MIL/uL (ref 3.87–5.11)
RDW: 12.9 % (ref 11.5–15.5)
WBC: 6.3 10*3/uL (ref 4.0–10.5)
nRBC: 0 % (ref 0.0–0.2)

## 2021-12-05 MED ORDER — TRAZODONE HCL 50 MG PO TABS
50.0000 mg | ORAL_TABLET | Freq: Once | ORAL | Status: AC
Start: 2021-12-05 — End: 2021-12-05
  Administered 2021-12-05: 50 mg via ORAL
  Filled 2021-12-05: qty 1

## 2021-12-05 MED ORDER — FUROSEMIDE 10 MG/ML IJ SOLN
40.0000 mg | Freq: Two times a day (BID) | INTRAMUSCULAR | Status: DC
  Administered 2021-12-05 – 2021-12-06 (×2): 40 mg via INTRAVENOUS
  Filled 2021-12-05 (×2): qty 4

## 2021-12-05 MED ORDER — SENNOSIDES-DOCUSATE SODIUM 8.6-50 MG PO TABS
1.0000 | ORAL_TABLET | Freq: Two times a day (BID) | ORAL | Status: DC
  Administered 2021-12-05 – 2021-12-09 (×9): 1 via ORAL
  Filled 2021-12-05 (×10): qty 1

## 2021-12-05 MED ORDER — POLYETHYLENE GLYCOL 3350 17 G PO PACK
17.0000 g | PACK | Freq: Every day | ORAL | Status: DC
  Administered 2021-12-05 – 2021-12-09 (×5): 17 g via ORAL
  Filled 2021-12-05 (×5): qty 1

## 2021-12-05 NOTE — Progress Notes (Signed)
Cardiology Progress Note   Patient Name: [name] Date of Encounter: 12/05/2021  Primary Cardiologist: Peter Martinique, MD  Subjective   Still dyspneic w/ minimal activity.  Remains on O2 via [place] @ 8lpm.  Inpatient Medications    Scheduled Meds:  apixaban  5 mg Oral BID   cholecalciferol  1,000 Units Oral Daily   citalopram  20 mg Oral Daily   digoxin  0.0625 mg Oral Daily   feeding supplement  237 mL Oral BID BM   gabapentin  100 mg Oral TID   levothyroxine  88 mcg Oral Daily   melatonin  5 mg Oral QHS   metoprolol tartrate  12.5 mg Oral BID   multivitamin with minerals  1 tablet Oral Daily   pantoprazole  40 mg Oral Daily   polyethylene glycol  17 g Oral Daily   potassium chloride SA  20 mEq Oral BID   senna-docusate  1 tablet Oral BID   torsemide  40 mg Oral BID   vitamin B-12  100 mcg Oral Daily   Continuous Infusions:  PRN Meds: acetaminophen, ipratropium-albuterol, morphine, ondansetron **OR** ondansetron (ZOFRAN) IV   Vital Signs    Vitals:   12/05/21 0531 12/05/21 0747 12/05/21 0800 12/05/21 1217  BP: (!) 83/50 (!) 83/58 (!) 92/57 97/67  Pulse: 89 (!) 118  99  Resp: 18 18  14   Temp: (!) 96.1 F (35.6 C) 97.8 F (36.6 C)  97.7 F (36.5 C)  TempSrc:      SpO2: 95% 90% 91% 98%  Weight: 92.3 kg     Height:        Intake/Output Summary (Last 24 hours) at 12/05/2021 1319 Last data filed at 12/05/2021 1220 Gross per 24 hour  Intake --  Output 2050 ml  Net -2050 ml   Filed Weights   12/03/21 0500 12/04/21 0424 12/05/21 0531  Weight: 92.1 kg 92.7 kg 92.3 kg    Physical Exam   GEN: Obese, in no acute distress.  HEENT: Grossly normal.  Neck: Supple, JVD to jaw, no carotid bruits, or masses. Cardiac: IR, IR, tachy, no murmurs, rubs, or gallops. No clubbing, cyanosis, 1+ bilat LE edema.  Radials 2+, DP/PT 2+ and equal bilaterally.  Respiratory:  Respirations regular and unlabored, crackles 2/3 way up bilat. GI: Obese, soft, nontender,  nondistended, BS + x 4. MS: no deformity or atrophy. Skin: warm and dry, no rash. Neuro:  Strength and sensation are intact. Psych: AAOx3.  Normal affect.  Labs    Chemistry Recent Labs  Lab 12/03/21 [phone] 12/04/21 0607 12/05/21 0614  NA 138 138 137  K 4.1 4.0 4.0  CL 102 100 97*  CO2 32 33* 36*  GLUCOSE 104* 99 114*  BUN 16 18 21   CREATININE 0.71 0.71 0.76  CALCIUM 9.0 9.2 9.1  GFRNONAA >60 >60 >60  ANIONGAP 4* 5 4*     Hematology Recent Labs  Lab 12/03/21 0611 12/04/21 0607 12/05/21 0614  WBC 4.7 5.1 6.3  RBC 4.29 4.24 4.31  HGB 14.7 14.8 14.7  HCT 44.9 45.2 45.3  MCV 104.7* 106.6* 105.1*  MCH 34.3* 34.9* 34.1*  MCHC 32.7 32.7 32.5  RDW 13.1 13.1 12.9  PLT 97* 95* 112*    Cardiac Enzymes  Recent Labs  Lab 12/02/21 1215  TROPONINIHS 18*      BNP    Component Value Date/Time   BNP 367.6 (H) 12/02/2021 1215    ProBNP    Component Value Date/Time   PROBNP  311.0 (H) 04/12/2015 1720   Lipids  Lab Results  Component Value Date   CHOL 198 11/14/2021   HDL 45.00 11/14/2021   LDLCALC 130 (H) 11/14/2021   TRIG 113.0 11/14/2021   CHOLHDL 4 11/14/2021   Radiology    DG Chest Port 1 View  Result Date: 12/05/2021 CLINICAL DATA:  Acute on chronic respiratory failure with hypoxia EXAM: PORTABLE CHEST 1 VIEW COMPARISON:  12/02/2021 FINDINGS: Chronic cardiomegaly and aortic atherosclerosis. Chronic lung disease with interstitial markings. No evidence of consolidation, collapse or effusion. No acute bone finding. IMPRESSION: Chronic cardiomegaly and aortic atherosclerosis. Chronic interstitial lung disease. No visible consolidation or collapse. Electronically Signed   By: Nelson Chimes M.D.   On: 12/05/2021 09:58   DG Chest 2 View  Result Date: 12/02/2021 CLINICAL DATA:  Shortness of breath, low O2 sats. EXAM: CHEST - 2 VIEW COMPARISON:  07/15/2021 and CT chest 02/14/2020. FINDINGS: Trachea is midline. Heart is enlarged. Thoracic aorta is calcified. Basilar  interstitial prominence and indistinctness. Tiny bilateral pleural effusions. Degenerative changes in the left shoulder. IMPRESSION: Congestive heart failure. Electronically Signed   By: Lorin Picket M.D.   On: 12/02/2021 12:41    Telemetry    Afib, rare PVCs, 90's to 110's - Personally Reviewed  Cardiac Studies   2D Echocardiogram 06.13.2023  1. Left ventricular ejection fraction, by estimation, is 55 to 60%. The  left ventricle has normal function. Left ventricular endocardial border  not optimally defined to evaluate regional wall motion. There is mild left  ventricular hypertrophy. Left  ventricular diastolic parameters are indeterminate.   2. Right ventricular systolic function is mildly reduced. The right  ventricular size is moderately enlarged. There is severely elevated  pulmonary artery systolic pressure.   3. Left atrial size was moderately dilated.   4. Right atrial size was moderately dilated.   5. The mitral valve is degenerative. Trivial mitral valve regurgitation.  Moderate mitral annular calcification.   6. The tricuspid valve is abnormal. Tricuspid valve regurgitation is  moderate to severe.   7. The aortic valve is tricuspid. There is mild thickening of the aortic  valve. Aortic valve regurgitation is not visualized. Aortic valve  sclerosis is present, with no evidence of aortic valve stenosis.   8. The inferior vena cava is dilated in size with <50% respiratory  variability, suggesting right atrial pressure of 15 mmHg.  _____________   Patient Profile     81 y.o. female with history of chronic combined systolic and diastolic congestive heart failure, permanent atrial fibrillation on apixaban, essential hypertension, non small cell lung cancer with metastasis to the supraclavicular lymph nodes in 2004, history of lower extremity DVT, hypothyroidism, chronic hypoxic respiratory failure on chronic home O2, COPD, AAA status post aortic bi-iliac stent graft, and  descending thoracic aortic aneurysm measuring 3.3 cm followed by vascular surgery, who was admitted 6/12 w/ recurrent CHF.  Assessment & Plan    1.  Acute on chronic combined syst/diast CHF:  Admitted 6/12 w/ volume overload.  EF 55-60% by echo this admission.  Responded well to IV diuresis and transitioned to PO torsemide on 6/15.  Minus 1.9L overnight and minus 5.5 L since admission, though intakes appear to be inaccurate.  Wt down 0.4kg since yesterday, but still above previously recorded outpt weights.  Still volume overloaded on exam w/ crackles, JVD, and lower ext edema.  Still requiring 8 lpm of O2.  I will transition diuretics back to lasix 40 IV bid.  Renal fxn has  been stable.  HR's remain mildly elevated but limited options due to soft BPs.  Cont low-dose ? blocker and digoxin.  2.  Acute on chronic resp failure w/ hypoxia/COPD:  Still volume overloaded.  See #1.  3.  Permanent Afib w/ RVR:  Rates 90's to low 100's on ? blocker, digoxin, and eliquis.  Suspect HRs will improve w/ diuresis/improved resp status.  Signed, Murray Hodgkins, NP  12/05/2021, 1:19 PM    For questions or updates, please contact   Please consult [email or web address] for contact info under Cardiology/STEMI.

## 2021-12-05 NOTE — Progress Notes (Signed)
Occupational Therapy Treatment Patient Details Name: [name] MRN: [id number] DOB: Jan 26, 1941 Today's Date: 12/05/2021   History of present illness [name] is an 70yoF who comes to [facility] on 12/02/21 after worse repiratory status at pulmonology apointment.  PMH: DVT, COPD, HTN, HDL, hypothyroidism, anxiety, AAA, chronic hypoxic arterial failure typically on 4 L nasal cannula. Pt admitted in acute CHF exacerbation adn ARF c hypoxia. Pt resides at [facility].   OT comments  Upon entering the room, pt supine in bed and reports fatigue and not sleeping well. OT is needing increased O2 from yesterday's session. OT cuing pt for pursed lip breathing exercises during education for energy conservation for self care tasks. Pt also performing ankle pumps and glute squeezes x 10 reps. Pt requesting drink and to rest at end of session secondary to fatigue. All needs within reach .   Recommendations for follow up therapy are one component of a multi-disciplinary discharge planning process, led by the attending physician.  Recommendations may be updated based on patient status, additional functional criteria and insurance authorization.    Follow Up Recommendations  Home health OT    Assistance Recommended at Discharge Intermittent Supervision/Assistance  Patient can return home with the following  A little help with walking and/or transfers;A lot of help with bathing/dressing/bathroom;Assistance with cooking/housework;Assist for transportation   Equipment Recommendations  None recommended by OT       Precautions / Restrictions Precautions Precautions: Fall Restrictions Weight Bearing Restrictions: No              ADL either performed or assessed with clinical judgement    Extremity/Trunk Assessment Upper Extremity Assessment Upper Extremity Assessment: Generalized weakness   Lower Extremity Assessment Lower Extremity Assessment: Generalized weakness        Vision Patient  Visual Report: No change from baseline            Cognition Arousal/Alertness: Awake/alert Behavior During Therapy: WFL for tasks assessed/performed Overall Cognitive Status: Within Functional Limits for tasks assessed                                                General Comments Right heel slightly red, no breakdown. Pt educated to float heels at all times    Pertinent Vitals/ Pain       Pain Assessment Pain Assessment: No/denies pain         Frequency  Min 2X/week        Progress Toward Goals  OT Goals(current goals can now be found in the care plan section)  Progress towards OT goals: Progressing toward goals  Acute Rehab OT Goals Patient Stated Goal: to get better OT Goal Formulation: With patient Time For Goal Achievement: 12/18/21 Potential to Achieve Goals: Good  Plan Discharge plan remains appropriate;Frequency remains appropriate       AM-PAC OT "6 Clicks" Daily Activity     Outcome Measure   Help from another person eating meals?: None Help from another person taking care of personal grooming?: A Little Help from another person toileting, which includes using toliet, bedpan, or urinal?: A Little Help from another person bathing (including washing, rinsing, drying)?: A Lot Help from another person to put on and taking off regular upper body clothing?: None Help from another person to put on and taking off regular lower body clothing?: A Lot 6 Click Score: 18  End of Session    OT Visit Diagnosis: Repeated falls (R29.6);Muscle weakness (generalized) (M62.81)   Activity Tolerance Patient limited by fatigue   Patient Left in bed;with call bell/phone within reach;with bed alarm set           Time: 1446-1456 OT Time Calculation (min): 10 min  Charges: OT General Charges $OT Visit: 1 Visit OT Treatments $Therapeutic Activity: 8-22 mins  Darleen Crocker, MS, OTR/L , CBIS ascom [phone]  12/05/21, 5:07 PM

## 2021-12-05 NOTE — Progress Notes (Signed)
Physical Therapy Treatment Patient Details Name: [name] MRN: [id number] DOB: 11/06/1940 Today's Date: 12/05/2021   History of Present Illness [name] is an 57yoF who comes to [facility] on 12/02/21 after worse repiratory status at pulmonology apointment.  PMH: DVT, COPD, HTN, HDL, hypothyroidism, anxiety, AAA, chronic hypoxic arterial failure typically on 4 L nasal cannula. Pt admitted in acute CHF exacerbation adn ARF c hypoxia. Pt resides at [facility].    PT Comments    Pt received in bed on 8L HFNC with O2 sats remaining in mid 90's. Pt completed B LE ROM prior to sitting EOB. No c/o dizziness while sitting. Good ability to stand from bed and ambulate short distance with RW to recliner. Pt appears to be making great functional improvement. Continue PT per POC until medically cleared to d/c home.    Recommendations for follow up therapy are one component of a multi-disciplinary discharge planning process, led by the attending physician.  Recommendations may be updated based on patient status, additional functional criteria and insurance authorization.  Follow Up Recommendations  Home health PT     Assistance Recommended at Discharge Intermittent Supervision/Assistance  Patient can return home with the following A little help with walking and/or transfers;A little help with bathing/dressing/bathroom;Assist for transportation;Assistance with cooking/housework   Equipment Recommendations  None recommended by PT    Recommendations for Other Services       Precautions / Restrictions Precautions Precautions: Fall Restrictions Weight Bearing Restrictions: No     Mobility  Bed Mobility Overal bed mobility: Independent             General bed mobility comments: seated in recliner chair    Transfers Overall transfer level: Needs assistance Equipment used: Rolling walker (2 wheels) Transfers: Sit to/from Stand, Bed to chair/wheelchair/BSC Sit to Stand:  Supervision Stand pivot transfers: Min guard         General transfer comment: Improved functional ability to transfer sit<>stand    Ambulation/Gait Ambulation/Gait assistance: Supervision Gait Distance (Feet): 5 Feet Assistive device: Rolling walker (2 wheels) Gait Pattern/deviations: Step-to pattern Gait velocity: decreased     General Gait Details: Appears safe with short distance mobility   Stairs             Wheelchair Mobility    Modified Rankin (Stroke Patients Only)       Balance                                            Cognition Arousal/Alertness: Awake/alert Behavior During Therapy: WFL for tasks assessed/performed Overall Cognitive Status: Within Functional Limits for tasks assessed                                          Exercises General Exercises - Lower Extremity Ankle Circles/Pumps: AROM, Both, 20 reps Heel Slides: AROM, Both, 5 reps Hip ABduction/ADduction: AROM, Both, 5 reps    General Comments General comments (skin integrity, edema, etc.): Right heel slightly red, no breakdown. Pt educated to float heels at all times      Pertinent Vitals/Pain Pain Assessment Pain Assessment: 0-10 Pain Score: 2  Pain Location: R Heel Pain Descriptors / Indicators: Sore Pain Intervention(s): Repositioned (Heels floated off bed)    Home Living  Prior Function            PT Goals (current goals can now be found in the care plan section) Acute Rehab PT Goals Patient Stated Goal: regain baseline respiratory status Progress towards PT goals: Progressing toward goals    Frequency    Min 2X/week      PT Plan Current plan remains appropriate    Co-evaluation              AM-PAC PT "6 Clicks" Mobility   Outcome Measure  Help needed turning from your back to your side while in a flat bed without using bedrails?: None Help needed moving from lying on your  back to sitting on the side of a flat bed without using bedrails?: None Help needed moving to and from a bed to a chair (including a wheelchair)?: A Little Help needed standing up from a chair using your arms (e.g., wheelchair or bedside chair)?: A Little Help needed to walk in hospital room?: A Little Help needed climbing 3-5 steps with a railing? : A Lot 6 Click Score: 19    End of Session Equipment Utilized During Treatment: Oxygen Activity Tolerance: Patient tolerated treatment well;No increased pain;Treatment limited secondary to medical complications (Comment) Patient left: in chair;with call bell/phone within reach;with chair alarm set Nurse Communication: Mobility status PT Visit Diagnosis: Other abnormalities of gait and mobility (R26.89);Difficulty in walking, not elsewhere classified (R26.2);Muscle weakness (generalized) (M62.81)     Time: 1610-9604 PT Time Calculation (min) (ACUTE ONLY): 40 min  Charges:  $Therapeutic Exercise: 8-22 mins $Therapeutic Activity: 23-37 mins                    Mikel Cella, PTA    [name] 12/05/2021, 2:14 PM

## 2021-12-05 NOTE — TOC Progression Note (Signed)
Transition of Care [facility]) - Progression Note    Patient Details  Name: [name] MRN: [id number] Date of Birth: 04/27/1941  Transition of Care [facility]) CM/SW Contact  Laurena Slimmer, RN Phone Number: 12/05/2021, 2:51 PM  Clinical Narrative:    Attempt to reach patient and daughter to review discharge plan per therapy recommendation. OT currently working with patient. Will reattempt.        Expected Discharge Plan and Services                                                 Social Determinants of Health (SDOH) Interventions    Readmission Risk Interventions     No data to display

## 2021-12-05 NOTE — Progress Notes (Signed)
   12/05/21 0700  Clinical Encounter Type  Visited With Patient  Visit Type Initial  Referral From Nurse  Consult/Referral To Chaplain   Chaplain responded to nurse consult. Patient just admitted to unit. Chaplain provided compassionate presence and reflective listening while patient spoke about health journey. Chaplain explained AD paperwork and left documents with patient and instructed to have nurse page Chaplain on call for completion. Patient appreciated [place] visit.

## 2021-12-05 NOTE — Progress Notes (Signed)
Progress Note   Patient: [name] XHB:[id number] DOB: 1940-09-24 DOA: 12/02/2021     3 DOS: the patient was seen and examined on 12/05/2021   Brief hospital course: 81 y.o. female with medical history significant for COPD with chronic respiratory failure on 4 L of oxygen, history of A. fib on anticoagulation therapy, history of chronic low back pain, anxiety and depression, history of chronic combined systolic and diastolic dysfunction CHF with last known LVEF of 35 to 40% from a 2D echocardiogram that was done in 12/22  who was sent to the ER for evaluation from her pulmonologist office due to increased oxygen requirement, patient at baseline wears 4 L of oxygen but is currently on 6 L as well as relative hypotension  6/13: Cardiology and palliative care consulted 6/14: remains on 6 L/min HFNC O2, IV >> PO diuresis 6/15: O2 needs up to 8 L this AM, CXR no infiltrate, back to IV diuressis   Assessment and Plan: * Acute on chronic combined systolic and diastolic CHF (congestive heart failure) ([place]) Presented with worsening shortness of breath from her baseline, progressive bilateral lower extremity swelling. Was also tachycardic with rapid atrial flutter. Chest x-ray shows bilateral pleural effusions and BNP is elevated Acute exacerbation of her underlying CHF appears to be rate related. Echo in December 2022 with LVEF is 35 to 40%. Echo this admission shows EF of 55 to 60%. -- Treated IV Lasix  -- Transitioned to oral torsemide 40 mg BID yesterday, but resumed on IV Lasix BID today due to persistent edema and rising O2 needs --Continue carvedilol --Not on ACEI or ARB due to relative hypotension Net IO Since Admission: -3,450 mL [12/04/21 1410]      Acute on chronic respiratory failure with hypoxia (HCC) Most likely secondary to acute on chronic combined systolic and diastolic dysfunction CHF. Requires 4 liters per minute O2 at baseline.  Presented with increased work of  breathing and SPO2 88% on usual 4 L. --Currently still on 8 L of oxygen, up from 6 L yesterday --Oxygen as tolerated, target sats 90 to 94% --Manage CHF as outlined with diuresis  Atrial flutter with rapid ventricular response ([place]) Patient with a history of atrial flutter, presented with worsening shortness of breath was found in a flutter with RVR. --Continue digoxin and metoprolol  -- Cardiology following --Continue Eliquis  Hypothyroid TSH 1.9 Continue Synthroid  Obstructive sleep apnea Continue CPAP at bedtime.  Chronic low back pain Continue morphine 7.5 mg every 6 hours as needed for pain control, also on Neurontin  Obesity (BMI 30-39.9) Patient's BMI is 81.01 Complicates overall prognosis and care Lifestyle modification and exercise has been discussed with him in detail.  Thrombocytopenia (HCC) Platelets have been slowly downtrending past few days, 105 >> 97 >> 95 -- Monitor CBC  Goals of care, counseling/discussion Palliative care consulted  Anxiety and depression Continue Celexa  Acute on chronic heart failure with preserved ejection fraction (HFpEF) ([place]) As outlined  Physical deconditioning PT and OT recommending home health. TOC following.        Subjective: Patient awake sitting up in bed when seen on rounds.  She has not had any bowel movement.  Reports some right heel pain from being in the bed.  Describes it as waxing waning throbbing pain.  She denies fevers chills or feeling sick.  No sore throat or congestion or worsening cough.  No other acute complaints or acute events reported.  Physical Exam: Vitals:   12/05/21 0747 12/05/21 0800  12/05/21 1217 12/05/21 1600  BP: (!) 83/58 (!) 92/57 97/67 101/75  Pulse: (!) 118  99 93  Resp: 18  14 17   Temp: 97.8 F (36.6 C)  97.7 F (36.5 C) (!) 97.4 F (36.3 C)  TempSrc:      SpO2: 90% 91% 98% 98%  Weight:      Height:       General exam: awake, alert, no acute distress, obese, chronically  ill-appearing HEENT: moist mucus membranes, hearing grossly normal  Respiratory system: Lungs clear with diminished bases bilaterally, no wheezes, normal respiratory effort at rest on 8 L/min HFNC O2. Cardiovascular system: RRR, persistent lower extremity edema appears slightly worse than yesterday.   Gastrointestinal system: Soft nondistended abdomen Central nervous system: A&O x3. no gross focal neurologic deficits, normal speech Extremities: moves all, mild lower extremity venous stasis changes Skin: Dry, intact, no rashes seen on visualized skin Psychiatry: normal mood, congruent affect, judgement and insight appear normal   Data Reviewed:  Notable labs: Chloride 97, bicarb 36, glucose 114, anion gap 4, platelets improved 112 up from 95  Chest x-ray this morning with no focal infiltrates to suggest pneumonia, personally reviewed and compared to chest x-ray from 6/12 and overall appears unchanged.  Shows cardiomegaly, aortic atherosclerosis, chronic interstitial changes  Family Communication: None at bedside but will attempt to call  Disposition: Status is: Inpatient Remains inpatient appropriate because: Remains with oxygen requirement higher than her baseline, resumed on IV diuresis   Planned Discharge Destination: Home with Home Health    Time spent: 35 minutes  Author: Ezekiel Slocumb, DO 12/05/2021 5:18 PM  For on call review [email or web address].

## 2021-12-06 DIAGNOSIS — I5043 Acute on chronic combined systolic (congestive) and diastolic (congestive) heart failure: Secondary | ICD-10-CM | POA: Diagnosis not present

## 2021-12-06 LAB — CBC
HCT: 45.3 % (ref 36.0–46.0)
Hemoglobin: 14.8 g/dL (ref 12.0–15.0)
MCH: 34.8 pg — ABNORMAL HIGH (ref 26.0–34.0)
MCHC: 32.7 g/dL (ref 30.0–36.0)
MCV: 106.6 fL — ABNORMAL HIGH (ref 80.0–100.0)
Platelets: 101 10*3/uL — ABNORMAL LOW (ref 150–400)
RBC: 4.25 MIL/uL (ref 3.87–5.11)
RDW: 12.8 % (ref 11.5–15.5)
WBC: 5.3 10*3/uL (ref 4.0–10.5)
nRBC: 0 % (ref 0.0–0.2)

## 2021-12-06 LAB — BLOOD GAS, VENOUS
Acid-Base Excess: 14 mmol/L — ABNORMAL HIGH (ref 0.0–2.0)
Bicarbonate: 41.5 mmol/L — ABNORMAL HIGH (ref 20.0–28.0)
O2 Saturation: 99.7 %
Patient temperature: 37
pCO2, Ven: 64 mmHg — ABNORMAL HIGH (ref 44–60)
pH, Ven: 7.42 (ref 7.25–7.43)
pO2, Ven: 131 mmHg — ABNORMAL HIGH (ref 32–45)

## 2021-12-06 LAB — BASIC METABOLIC PANEL
Anion gap: 5 (ref 5–15)
BUN: 21 mg/dL (ref 8–23)
CO2: 37 mmol/L — ABNORMAL HIGH (ref 22–32)
Calcium: 8.8 mg/dL — ABNORMAL LOW (ref 8.9–10.3)
Chloride: 98 mmol/L (ref 98–111)
Creatinine, Ser: 0.73 mg/dL (ref 0.44–1.00)
GFR, Estimated: >60 mL/min (ref >60)
Glucose, Bld: 128 mg/dL — ABNORMAL HIGH (ref 70–99)
Potassium: 4 mmol/L (ref 3.5–5.1)
Sodium: 140 mmol/L (ref 135–145)

## 2021-12-06 LAB — MAGNESIUM: Magnesium: 2 mg/dL (ref 1.7–2.4)

## 2021-12-06 MED ORDER — FUROSEMIDE 10 MG/ML IJ SOLN
40.0000 mg | Freq: Three times a day (TID) | INTRAMUSCULAR | Status: DC
  Administered 2021-12-06 – 2021-12-07 (×5): 40 mg via INTRAVENOUS
  Filled 2021-12-06 (×5): qty 4

## 2021-12-06 NOTE — Progress Notes (Signed)
Cardiology Progress Note   Patient Name: [name] Date of Encounter: 12/06/2021  Primary Cardiologist: Peter Martinique, MD  Subjective   Breathing a little better. Down to 4lpm on [place] - home rate.  Inpatient Medications    Scheduled Meds:  apixaban  5 mg Oral BID   cholecalciferol  1,000 Units Oral Daily   citalopram  20 mg Oral Daily   digoxin  0.0625 mg Oral Daily   feeding supplement  237 mL Oral BID BM   furosemide  40 mg Intravenous BID   gabapentin  100 mg Oral TID   levothyroxine  88 mcg Oral Daily   melatonin  5 mg Oral QHS   metoprolol tartrate  12.5 mg Oral BID   multivitamin with minerals  1 tablet Oral Daily   pantoprazole  40 mg Oral Daily   polyethylene glycol  17 g Oral Daily   potassium chloride SA  20 mEq Oral BID   senna-docusate  1 tablet Oral BID   vitamin B-12  100 mcg Oral Daily   Continuous Infusions:  PRN Meds: acetaminophen, ipratropium-albuterol, morphine, ondansetron **OR** ondansetron (ZOFRAN) IV   Vital Signs    Vitals:   12/05/21 2035 12/06/21 0024 12/06/21 0449 12/06/21 0500  BP:  112/75 106/69   Pulse:  85 92   Resp:  18 19   Temp:  97.6 F (36.4 C) 97.8 F (36.6 C)   TempSrc:  Oral Oral   SpO2: 96% 97% 96%   Weight:    91.6 kg  Height:        Intake/Output Summary (Last 24 hours) at 12/06/2021 0746 Last data filed at 12/06/2021 0500 Gross per 24 hour  Intake --  Output 2175 ml  Net -2175 ml   Filed Weights   12/04/21 0424 12/05/21 0531 12/06/21 0500  Weight: 92.7 kg 92.3 kg 91.6 kg    Physical Exam   GEN: Well nourished, well developed, in no acute distress.  HEENT: Grossly normal.  Neck: Supple, JVD to jaw, no carotid bruits or masses. Cardiac: IR, IR, no murmurs, rubs, or gallops. No clubbing, cyanosis, 1+ bilat LE edema.  Radials 2+, DP/PT 2+ and equal bilaterally.  Respiratory:  Respirations regular and unlabored, bibasilar crackles, otw diminished breath sounds bilat.   GI: Obese, soft, nontender,  nondistended, BS + x 4. MS: no deformity or atrophy. Skin: warm and dry, no rash. Neuro:  Strength and sensation are intact. Psych: AAOx3.  Normal affect.  Labs    Chemistry Recent Labs  Lab 12/04/21 0607 12/05/21 0614 12/06/21 0514  NA 138 137 140  K 4.0 4.0 4.0  CL 100 97* 98  CO2 33* 36* 37*  GLUCOSE 99 114* 128*  BUN 18 21 21   CREATININE 0.71 0.76 0.73  CALCIUM 9.2 9.1 8.8*  GFRNONAA >60 >60 >60  ANIONGAP 5 4* 5     Hematology Recent Labs  Lab 12/04/21 0607 12/05/21 0614 12/06/21 0514  WBC 5.1 6.3 5.3  RBC 4.24 4.31 4.25  HGB 14.8 14.7 14.8  HCT 45.2 45.3 45.3  MCV 106.6* 105.1* 106.6*  MCH 34.9* 34.1* 34.8*  MCHC 32.7 32.5 32.7  RDW 13.1 12.9 12.8  PLT 95* 112* 101*    Cardiac Enzymes  Recent Labs  Lab 12/02/21 1215  TROPONINIHS 18*      BNP    Component Value Date/Time   BNP 367.6 (H) 12/02/2021 1215    ProBNP    Component Value Date/Time   PROBNP 311.0 (H) 04/12/2015  1720   Lipids  Lab Results  Component Value Date   CHOL 198 11/14/2021   HDL 45.00 11/14/2021   LDLCALC 130 (H) 11/14/2021   TRIG 113.0 11/14/2021   CHOLHDL 4 11/14/2021    Radiology    DG Chest Port 1 View  Result Date: 12/05/2021 CLINICAL DATA:  Acute on chronic respiratory failure with hypoxia EXAM: PORTABLE CHEST 1 VIEW COMPARISON:  12/02/2021 FINDINGS: Chronic cardiomegaly and aortic atherosclerosis. Chronic lung disease with interstitial markings. No evidence of consolidation, collapse or effusion. No acute bone finding. IMPRESSION: Chronic cardiomegaly and aortic atherosclerosis. Chronic interstitial lung disease. No visible consolidation or collapse. Electronically Signed   By: Nelson Chimes M.D.   On: 12/05/2021 09:58   DG Chest 2 View  Result Date: 12/02/2021 CLINICAL DATA:  Shortness of breath, low O2 sats. EXAM: CHEST - 2 VIEW COMPARISON:  07/15/2021 and CT chest 02/14/2020. FINDINGS: Trachea is midline. Heart is enlarged. Thoracic aorta is calcified.  Basilar interstitial prominence and indistinctness. Tiny bilateral pleural effusions. Degenerative changes in the left shoulder. IMPRESSION: Congestive heart failure. Electronically Signed   By: Lorin Picket M.D.   On: 12/02/2021 12:41    Telemetry    Afib 90 to 110, rare PVC/couplet,  - Personally Reviewed  Cardiac Studies   2D Echocardiogram 06.13.2023   1. Left ventricular ejection fraction, by estimation, is 55 to 60%. The  left ventricle has normal function. Left ventricular endocardial border  not optimally defined to evaluate regional wall motion. There is mild left  ventricular hypertrophy. Left  ventricular diastolic parameters are indeterminate.   2. Right ventricular systolic function is mildly reduced. The right  ventricular size is moderately enlarged. There is severely elevated  pulmonary artery systolic pressure.   3. Left atrial size was moderately dilated.   4. Right atrial size was moderately dilated.   5. The mitral valve is degenerative. Trivial mitral valve regurgitation.  Moderate mitral annular calcification.   6. The tricuspid valve is abnormal. Tricuspid valve regurgitation is  moderate to severe.   7. The aortic valve is tricuspid. There is mild thickening of the aortic  valve. Aortic valve regurgitation is not visualized. Aortic valve  sclerosis is present, with no evidence of aortic valve stenosis.   8. The inferior vena cava is dilated in size with <50% respiratory  variability, suggesting right atrial pressure of 15 mmHg.  _____________   Patient Profile     81 y.o. female with history of chronic combined systolic and diastolic congestive heart failure, permanent atrial fibrillation on apixaban, essential hypertension, non small cell lung cancer with metastasis to the supraclavicular lymph nodes in 2004, history of lower extremity DVT, hypothyroidism, chronic hypoxic respiratory failure on chronic home O2, COPD, AAA status post aortic bi-iliac stent  graft, and descending thoracic aortic aneurysm measuring 3.3 cm followed by vascular surgery, who was admitted 6/12 w/ recurrent CHF.  Assessment & Plan    1.  Acute on chornic combined syst/diast CHF:  Admitted 6/12 w/ volume overload.  EF 55-60% by echo this admission.  Responded well to IV diuresis and transitioned to PO torsemide on 6/15, but we changed back to IV lasix on 6/16.  Intakes not recorded over past few days.  2.1L out.  Wt down to 91.6 kg (dry wt closer to 88 kg).  Renal fxn stable. Breathing better - down to 4lpm which is home flow - though still volume overloaded w/ crackles, JVD, and lower ext edema.  BP has been soft, which  has limited our ability to more aggressively diurese.  Will try increasing lasix to 40 TID today.  Cont low-dose ? blocker and digoxin.  2.  Acute on chronic resp failure w/ hypoxia/COPD:  Still volume overloaded.  See #1.  3.  Permanent Afib w/ RVR:  Rates relatively stable between 90 and 110 on ? blocker and digoxin.  [place] w/ eliquis.  Suspect HRs will improve w/ diuresis/improved resp status.  Signed, Murray Hodgkins, NP  12/06/2021, 7:46 AM    For questions or updates, please contact   Please consult [email or web address] for contact info under Cardiology/STEMI.

## 2021-12-06 NOTE — TOC Progression Note (Addendum)
Transition of Care [facility]) - Progression Note    Patient Details  Name: Akemi Overholser MRN: [id number] Date of Birth: Jul 29, 1940  [facility]) CM/SW Contact  Laurena Slimmer, RN Phone Number: 12/06/2021, 4:19 PM  Clinical Narrative:    Spoke with patient by phone to get choice of HH. Patient cold not remember name of agency. Requested to call facility to get the name of [facility] agency that she used before.   Attempted to contact Lattie Haw at [facility]. No answer. Unable to leave a message.        Expected Discharge Plan and Services                                                 Social Determinants of Health (SDOH) Interventions    Readmission Risk Interventions     No data to display

## 2021-12-06 NOTE — Care Management Important Message (Signed)
Important Message  Patient Details  Name: [name] MRN: [id number] Date of Birth: May 19, 1941   Medicare Important Message Given:  Yes     Dannette Barbara 12/06/2021, 4:09 PM

## 2021-12-06 NOTE — Progress Notes (Signed)
Progress Note   Patient: [name] WJX:[id number] DOB: 03-12-1941 DOA: 12/02/2021     4 DOS: the patient was seen and examined on 12/06/2021   Brief hospital course: 81 y.o. female with medical history significant for COPD with chronic respiratory failure on 4 L of oxygen, history of A. fib on anticoagulation therapy, history of chronic low back pain, anxiety and depression, history of chronic combined systolic and diastolic dysfunction CHF with last known LVEF of 35 to 40% from a 2D echocardiogram that was done in 12/22  who was sent to the ER for evaluation from her pulmonologist office due to increased oxygen requirement, patient at baseline wears 4 L of oxygen but is currently on 6 L as well as relative hypotension  6/13: Cardiology and palliative care consulted 6/14: remains on 6 L/min HFNC O2, IV >> PO diuresis 6/15: O2 needs up to 8 L this AM, CXR no infiltrate, back to IV diuressis   Assessment and Plan: * Acute on chronic combined systolic and diastolic CHF (congestive heart failure) ([place]) Presented with worsening shortness of breath from her baseline, progressive bilateral lower extremity swelling. Was also tachycardic with rapid atrial flutter. Chest x-ray shows bilateral pleural effusions and BNP is elevated Acute exacerbation of her underlying CHF appears to be rate related. Echo in December 2022 with LVEF is 35 to 40%. Echo this admission shows EF of 55 to 60%. -- Treated IV Lasix, was transitioned to oral torsemide 40 mg BID 6/14, but resumed on IV Lasix BID 6/15 due to persistent signs of volume overload and increased oxygen requirement --IV Lasix increased to TID --Continue carvedilol (hold if soft BP) --Not on ACEI or ARB due to relative hypotension Net IO Since Admission: -3,450 mL [12/04/21 1410]      Acute on chronic respiratory failure with hypoxia (HCC) Most likely secondary to acute on chronic combined systolic and diastolic dysfunction CHF. Requires 4  liters per minute O2 at baseline.  Presented with increased work of breathing and SPO2 88% on usual 4 L. 6/15: Requiring 6 >> 8 >> 10 L/min O2 6/16: Improved oxygen requirement weaned to her baseline 4 L since yesterday --Continue 4 L/min [place] O2 --Supplemental O2 as needed, target sats 90 to 94% --Manage CHF as outlined with diuresis  Atrial flutter with rapid ventricular response ([place]) Patient with a history of atrial flutter, presented with worsening shortness of breath was found in a flutter with RVR. --Continue digoxin and metoprolol  -- Cardiology following --Continue Eliquis  Hypothyroid TSH 1.9 Continue Synthroid  Obstructive sleep apnea Continue CPAP at bedtime.  Chronic low back pain Continue morphine 7.5 mg every 6 hours as needed for pain control, also on Neurontin  Obesity (BMI 30-39.9) Patient's BMI is 21.30 Complicates overall prognosis and care Lifestyle modification and exercise has been discussed with him in detail.  Thrombocytopenia (HCC) Platelets have been slowly downtrending past few days, 105 >> 97 >> 95 -- Monitor CBC  Goals of care, counseling/discussion Palliative care consulted  Anxiety and depression Continue Celexa  Acute on chronic heart failure with preserved ejection fraction (HFpEF) ([place]) As outlined  Physical deconditioning PT and OT recommending home health. TOC following.        Subjective: Patient awake sitting up in bed when seen on rounds.  She reports still no bowel movement, was given MiraLAX earlier today.  Overall says feeling better.  Has been getting poor sleep previous nights but slept a little better last night.  Physical Exam: Vitals:  12/06/21 0449 12/06/21 0500 12/06/21 0754 12/06/21 1136  BP: 106/69  95/67 91/61  Pulse: 92  93 98  Resp: 19  20 16   Temp: 97.8 F (36.6 C)  98.3 F (36.8 C) 97.8 F (36.6 C)  TempSrc: Oral  Oral Oral  SpO2: 96%  92% (!) 89%  Weight:  91.6 kg    Height:       General exam:  awake, alert, no acute distress, obese, chronically ill-appearing HEENT: Clear conjunctiva, hearing grossly normal, moist mucous membranes Respiratory system: CTA B with bibasilar crackles, no wheezes or rhonchi heard, normal respiratory effort at rest on 4 L/min [place] O2 Cardiovascular system: Regular rate and rhythm, bilateral lower extremity edema 1+ Central nervous system: A&O x3.  Grossly nonfocal exam.  Normal speech Extremities: moves all, bilateral lower extremity edema as above, mild lower extremity venous stasis changes Skin: Dry, intact, no rashes seen on visualized skin Psychiatry: normal mood, congruent affect, judgement and insight appear normal   Data Reviewed:  Notable labs: Bicarb 37, glucose 128, calcium 8.8, platelets 101 down slightly from 112 but overall stable.  VBG with normal pH, slightly elevated PCO2 of 64, elevated PO2 of 131   Family Communication: None at bedside but will attempt to call  Disposition: Status is: Inpatient Remains inpatient appropriate because: Undergoing further IV diuresis per cardiology   Planned Discharge Destination: Home with Home Health    Time spent: 35 minutes  Author: Ezekiel Slocumb, DO 12/06/2021 3:43 PM  For on call review [email or web address].

## 2021-12-07 ENCOUNTER — Encounter: Payer: Self-pay | Admitting: Internal Medicine

## 2021-12-07 DIAGNOSIS — I4821 Permanent atrial fibrillation: Secondary | ICD-10-CM | POA: Diagnosis not present

## 2021-12-07 DIAGNOSIS — I5033 Acute on chronic diastolic (congestive) heart failure: Secondary | ICD-10-CM | POA: Diagnosis not present

## 2021-12-07 DIAGNOSIS — J9601 Acute respiratory failure with hypoxia: Secondary | ICD-10-CM | POA: Insufficient documentation

## 2021-12-07 DIAGNOSIS — I5043 Acute on chronic combined systolic (congestive) and diastolic (congestive) heart failure: Secondary | ICD-10-CM | POA: Diagnosis not present

## 2021-12-07 DIAGNOSIS — R5381 Other malaise: Secondary | ICD-10-CM | POA: Diagnosis not present

## 2021-12-07 LAB — BASIC METABOLIC PANEL
Anion gap: 6 (ref 5–15)
BUN: 17 mg/dL (ref 8–23)
CO2: 38 mmol/L — ABNORMAL HIGH (ref 22–32)
Calcium: 9.4 mg/dL (ref 8.9–10.3)
Chloride: 95 mmol/L — ABNORMAL LOW (ref 98–111)
Creatinine, Ser: 0.86 mg/dL (ref 0.44–1.00)
GFR, Estimated: >60 mL/min (ref >60)
Glucose, Bld: 110 mg/dL — ABNORMAL HIGH (ref 70–99)
Potassium: 4.2 mmol/L (ref 3.5–5.1)
Sodium: 139 mmol/L (ref 135–145)

## 2021-12-07 NOTE — TOC Progression Note (Signed)
Transition of Care [facility]) - Progression Note    Patient Details  Name: [name] MRN: [id number] Date of Birth: 06/15/1941  Transition of Care [facility]) CM/SW Contact  [name], RN Phone Number: 12/07/2021, 2:17 PM  Clinical Narrative:    Plan for discharge on Sunday or Monday: Spoke with the patient and offered to call her daughter Anne Ng concerning recommendations for HHPT/OT. Pt received and had requested [facility] contracted [place] agency [employer]). Spoke with Sarah with the referral and requested services again for PT/OT [facility].   TOC updated with no other needs presented.      Barriers to Discharge: Barriers Resolved  Expected Discharge Plan and Services                                     HH Arranged: PT [place] Agency: [facility]) Date [place]: 12/07/21 Time [place]: 1330 Representative spoke with at [place]: [place] Determinants of Health ([place]) Interventions    Readmission Risk Interventions     No data to display

## 2021-12-07 NOTE — Progress Notes (Signed)
Progress Note   Patient: [name] XIP:[id number] DOB: 03-31-1941 DOA: 12/02/2021     5 DOS: the patient was seen and examined on 12/07/2021   Brief hospital course: 81 y.o. female with medical history significant for COPD with chronic respiratory failure on 4 L of oxygen, history of A. fib on anticoagulation therapy, history of chronic low back pain, anxiety and depression, history of chronic combined systolic and diastolic dysfunction CHF with last known LVEF of 35 to 40% from a 2D echocardiogram that was done in 12/22  who was sent to the ER for evaluation from her pulmonologist office due to increased oxygen requirement, patient at baseline wears 4 L of oxygen but is currently on 6 L as well as relative hypotension  6/13: Cardiology and palliative care consulted 6/14: remains on 6 L/min HFNC O2, IV >> PO diuresis 6/15: O2 needs up to 8 L this AM, CXR no infiltrate, back to IV diuressis   Assessment and Plan: * Acute on chronic combined systolic and diastolic CHF (congestive heart failure) ([place]) Presented with worsening shortness of breath from her baseline, progressive bilateral lower extremity swelling. Was also tachycardic with rapid atrial flutter. Chest x-ray shows bilateral pleural effusions and BNP is elevated Acute exacerbation of her underlying CHF appears to be rate related. Echo in December 2022 with LVEF is 35 to 40%. Echo this admission shows EF of 55 to 60%. --Cardiology managing -- Treated IV Lasix, was transitioned to oral torsemide 40 mg BID 6/14, but resumed on IV Lasix BID 6/15 due to persistent signs of volume overload and increased oxygen requirement --IV Lasix increased to TID --Expect transition to p.o. diuretic in 1 to 2 days --Continue carvedilol (hold if soft BP) --Not on ACEI or ARB due to relative hypotension Net IO Since Admission: -8,275 mL [12/07/21 1753]       Acute on chronic respiratory failure with hypoxia (HCC) Most likely secondary to  acute on chronic combined systolic and diastolic dysfunction CHF. Requires 4 liters per minute O2 at baseline.  Presented with increased work of breathing and SPO2 88% on usual 4 L. 6/15: Requiring 6 >> 8 >> 10 L/min O2 6/16: Improved oxygen requirement weaned to her baseline 4 L since yesterday 6/17: Stable on 4 L/min, baseline --Continue 4 L/min  O2 --Supplemental O2 as needed, target sats 90 to 94% --Manage CHF as outlined with diuresis  Atrial flutter with rapid ventricular response ([place]) Patient with a history of atrial flutter, presented with worsening shortness of breath was found in a flutter with RVR. --Continue digoxin and metoprolol  -- Cardiology following --Continue Eliquis  Hypothyroid TSH 1.9 Continue Synthroid  Obstructive sleep apnea Continue CPAP at bedtime.  Chronic low back pain Continue morphine 7.5 mg every 6 hours as needed for pain control, also on Neurontin  Obesity (BMI 30-39.9) Patient's BMI is 67.34 Complicates overall prognosis and care Lifestyle modification and exercise has been discussed with him in detail.  Thrombocytopenia (HCC) Platelets have been slowly downtrending 105 >> 97 >> 95 >> 112 >> 101 -- Monitor CBC  Goals of care, counseling/discussion Palliative care consulted  Anxiety and depression Continue Celexa  Acute on chronic heart failure with preserved ejection fraction (HFpEF) ([place]) As outlined  Physical deconditioning PT and OT recommending home health. TOC following.  Patient from [facility] assisted living.          Subjective: Patient awake sitting up in bed when seen on rounds.  She reports feeling okay.  They just brought  lunch and she really does not feel hungry but wants to keep working on it, try to eat a little more.  Denies abdominal pain or nausea says just not hungry right now.  Otherwise she says appetite is fair.  Denies significant shortness of breath, fevers chills or any other acute complaints.  Hopes to  go home soon.  Physical Exam: Vitals:   12/07/21 0549 12/07/21 0845 12/07/21 1307 12/07/21 1524  BP: 107/67 106/62 104/68 100/60  Pulse: (!) 57 (!) 103 99 61  Resp: 17 17 18 18   Temp: 98 F (36.7 C) 98.3 F (36.8 C) 97.7 F (36.5 C) 97.7 F (36.5 C)  TempSrc: Oral  Axillary   SpO2: 94% 93%  95%  Weight: 91.3 kg     Height:       General exam: Awake and alert, no acute distress, chronically ill-appearing HEENT: Hearing grossly normal, moist mucous membranes Respiratory system: Lungs clear with diminished bases bilaterally, no expiratory wheezes, normal respiratory effort at rest on 4 L/min [place] O2 Cardiovascular system: RRR, stable to slightly improved 1+ bilateral lower extremity edema Central nervous system: Grossly nonfocal exam, normal speech, alert and oriented Extremities: moves all, bilateral lower extremity edema as above, mild lower extremity venous stasis changes Psychiatry: normal mood, congruent affect, judgement and insight appear normal   Data Reviewed:  Notable labs: Chloride 95, bicarb 38, glucose 110.  Renal function and electrolytes are stable   Family Communication: None at bedside but will attempt to call  Disposition: Status is: Inpatient Remains inpatient appropriate because: Undergoing further IV diuresis per cardiology   Planned Discharge Destination: Home with Home Health    Time spent: 35 minutes  Author: Ezekiel Slocumb, DO 12/07/2021 5:55 PM  For on call review [email or web address].

## 2021-12-07 NOTE — Progress Notes (Signed)
Cardiology Progress Note   Patient Name: [name] Date of Encounter: 12/07/2021  Primary Cardiologist: Peter Martinique, MD  Subjective   Breathing seems to be improving. No chest pain.  Inpatient Medications    Scheduled Meds:  apixaban  5 mg Oral BID   cholecalciferol  1,000 Units Oral Daily   citalopram  20 mg Oral Daily   digoxin  0.0625 mg Oral Daily   feeding supplement  237 mL Oral BID BM   furosemide  40 mg Intravenous TID   gabapentin  100 mg Oral TID   levothyroxine  88 mcg Oral Daily   melatonin  5 mg Oral QHS   metoprolol tartrate  12.5 mg Oral BID   multivitamin with minerals  1 tablet Oral Daily   pantoprazole  40 mg Oral Daily   polyethylene glycol  17 g Oral Daily   potassium chloride SA  20 mEq Oral BID   senna-docusate  1 tablet Oral BID   vitamin B-12  100 mcg Oral Daily   Continuous Infusions:  PRN Meds: acetaminophen, ipratropium-albuterol, morphine, ondansetron **OR** ondansetron (ZOFRAN) IV   Vital Signs    Vitals:   12/06/21 1945 12/07/21 0012 12/07/21 0549 12/07/21 0845  BP: 107/64 108/68 107/67 106/62  Pulse: (!) 102 (!) 53 (!) 57 (!) 103  Resp: 19 20 17 17   Temp: 98 F (36.7 C) 97.9 F (36.6 C) 98 F (36.7 C) 98.3 F (36.8 C)  TempSrc:   Oral   SpO2: 96% 92% 94% 93%  Weight:   91.3 kg   Height:        Intake/Output Summary (Last 24 hours) at 12/07/2021 0851 Last data filed at 12/06/2021 1817 Gross per 24 hour  Intake --  Output 150 ml  Net -150 ml   Filed Weights   12/05/21 0531 12/06/21 0500 12/07/21 0549  Weight: 92.3 kg 91.6 kg 91.3 kg    Physical Exam   GEN: Obese, in no acute distress.  HEENT: Grossly normal.  Neck: Supple, JVD to jaw, no carotid bruits or masses. Cardiac: IR, IR, no murmurs, rubs, or gallops. No clubbing, cyanosis, trace bilat LE edema.  Radials 2+, DP/PT 2+ and equal bilaterally.  Respiratory:  Respirations regular and unlabored, bibasilar crackles, otw diminished breath sounds bilat.    GI: Obese, soft, nontender, nondistended, BS + x 4. MS: no deformity or atrophy. Skin: warm and dry, no rash. Neuro:  Strength and sensation are intact. Psych: AAOx3.  Normal affect.  Labs    Chemistry Recent Labs  Lab 12/05/21 0614 12/06/21 0514 12/07/21 0801  NA 137 140 139  K 4.0 4.0 4.2  CL 97* 98 95*  CO2 36* 37* 38*  GLUCOSE 114* 128* 110*  BUN 21 21 17   CREATININE 0.76 0.73 0.86  CALCIUM 9.1 8.8* 9.4  GFRNONAA >60 >60 >60  ANIONGAP 4* 5 6     Hematology Recent Labs  Lab 12/04/21 0607 12/05/21 0614 12/06/21 0514  WBC 5.1 6.3 5.3  RBC 4.24 4.31 4.25  HGB 14.8 14.7 14.8  HCT 45.2 45.3 45.3  MCV 106.6* 105.1* 106.6*  MCH 34.9* 34.1* 34.8*  MCHC 32.7 32.5 32.7  RDW 13.1 12.9 12.8  PLT 95* 112* 101*    Cardiac Enzymes  Recent Labs  Lab 12/02/21 1215  TROPONINIHS 18*      BNP    Component Value Date/Time   BNP 367.6 (H) 12/02/2021 1215    ProBNP    Component Value Date/Time   PROBNP  311.0 (H) 04/12/2015 1720   Lipids  Lab Results  Component Value Date   CHOL 198 11/14/2021   HDL 45.00 11/14/2021   LDLCALC 130 (H) 11/14/2021   TRIG 113.0 11/14/2021   CHOLHDL 4 11/14/2021    Radiology    DG Chest Port 1 View  Result Date: 12/05/2021 CLINICAL DATA:  Acute on chronic respiratory failure with hypoxia EXAM: PORTABLE CHEST 1 VIEW COMPARISON:  12/02/2021 FINDINGS: Chronic cardiomegaly and aortic atherosclerosis. Chronic lung disease with interstitial markings. No evidence of consolidation, collapse or effusion. No acute bone finding. IMPRESSION: Chronic cardiomegaly and aortic atherosclerosis. Chronic interstitial lung disease. No visible consolidation or collapse. Electronically Signed   By: Nelson Chimes M.D.   On: 12/05/2021 09:58   DG Chest 2 View  Result Date: 12/02/2021 CLINICAL DATA:  Shortness of breath, low O2 sats. EXAM: CHEST - 2 VIEW COMPARISON:  07/15/2021 and CT chest 02/14/2020. FINDINGS: Trachea is midline. Heart is enlarged.  Thoracic aorta is calcified. Basilar interstitial prominence and indistinctness. Tiny bilateral pleural effusions. Degenerative changes in the left shoulder. IMPRESSION: Congestive heart failure. Electronically Signed   By: Lorin Picket M.D.   On: 12/02/2021 12:41    Telemetry    Afib, 90's to low 100's - Personally Reviewed  Cardiac Studies   2D Echocardiogram 06.13.2023   1. Left ventricular ejection fraction, by estimation, is 55 to 60%. The  left ventricle has normal function. Left ventricular endocardial border  not optimally defined to evaluate regional wall motion. There is mild left  ventricular hypertrophy. Left  ventricular diastolic parameters are indeterminate.   2. Right ventricular systolic function is mildly reduced. The right  ventricular size is moderately enlarged. There is severely elevated  pulmonary artery systolic pressure.   3. Left atrial size was moderately dilated.   4. Right atrial size was moderately dilated.   5. The mitral valve is degenerative. Trivial mitral valve regurgitation.  Moderate mitral annular calcification.   6. The tricuspid valve is abnormal. Tricuspid valve regurgitation is  moderate to severe.   7. The aortic valve is tricuspid. There is mild thickening of the aortic  valve. Aortic valve regurgitation is not visualized. Aortic valve  sclerosis is present, with no evidence of aortic valve stenosis.   8. The inferior vena cava is dilated in size with <50% respiratory  variability, suggesting right atrial pressure of 15 mmHg.  _____________   Patient Profile     81 y.o. female with history of chronic combined systolic and diastolic congestive heart failure, permanent atrial fibrillation on apixaban, essential hypertension, non small cell lung cancer with metastasis to the supraclavicular lymph nodes in 2004, history of lower extremity DVT, hypothyroidism, chronic hypoxic respiratory failure on chronic home O2, COPD, AAA status post  aortic bi-iliac stent graft, and descending thoracic aortic aneurysm measuring 3.3 cm followed by vascular surgery, who was admitted 6/12 w/ recurrent CHF.  Assessment & Plan    1.  Acute on chronic combined syst/diast CHF:  Admitted 6/12 w/ volume overload.  EF 55-60% by echo this admission.  Responded well to IV diuresis and transitioned to PO torsemide on 6/15, but we changed back to IV lasix on 6/16.  Intakes not recorded over past few days and only 150 ml of output recorded 116.  Wt down 0.3 kg to 91.3 kg per bed scale.  Dry wt closer to 88 kg).  BMET just returned.  BUN/Creat stable.  Bicarb cont to rise slowly.  Remains on 4lpm, which is home  flow - though still w/ some evidence of volume overload.  Cont IV lasix today. Cont low-dose ? blocker and digoxin.  2.  Acute on chronic resp failure w/ hypoxia/COPD:  Still volume overloaded.  See #1.  3.  Permanent Afib w/ RVR:  Rates relatively stable between 90 and low 100's on ? blocker and digoxin.  [place]ice w/ eliquis.    Signed, Murray Hodgkins, NP  12/07/2021, 8:51 AM    For questions or updates, please contact   Please consult [email or web address] for contact info under Cardiology/STEMI.

## 2021-12-08 DIAGNOSIS — I5043 Acute on chronic combined systolic (congestive) and diastolic (congestive) heart failure: Secondary | ICD-10-CM | POA: Diagnosis not present

## 2021-12-08 DIAGNOSIS — I5033 Acute on chronic diastolic (congestive) heart failure: Secondary | ICD-10-CM | POA: Diagnosis not present

## 2021-12-08 DIAGNOSIS — I4821 Permanent atrial fibrillation: Secondary | ICD-10-CM | POA: Diagnosis not present

## 2021-12-08 LAB — BASIC METABOLIC PANEL
Anion gap: 6 (ref 5–15)
BUN: 16 mg/dL (ref 8–23)
CO2: 38 mmol/L — ABNORMAL HIGH (ref 22–32)
Calcium: 9.2 mg/dL (ref 8.9–10.3)
Chloride: 96 mmol/L — ABNORMAL LOW (ref 98–111)
Creatinine, Ser: 0.77 mg/dL (ref 0.44–1.00)
GFR, Estimated: >60 mL/min (ref >60)
Glucose, Bld: 115 mg/dL — ABNORMAL HIGH (ref 70–99)
Potassium: 3.8 mmol/L (ref 3.5–5.1)
Sodium: 140 mmol/L (ref 135–145)

## 2021-12-08 MED ORDER — FUROSEMIDE 10 MG/ML IJ SOLN: 40.0000 mg | Freq: Two times a day (BID) | INTRAMUSCULAR | Status: DC

## 2021-12-08 MED ORDER — TORSEMIDE 20 MG PO TABS
40.0000 mg | ORAL_TABLET | Freq: Two times a day (BID) | ORAL | Status: DC
  Administered 2021-12-08 – 2021-12-09 (×2): 40 mg via ORAL
  Filled 2021-12-08 (×2): qty 2

## 2021-12-08 NOTE — Progress Notes (Signed)
Progress Note   Patient: [name] HGD:[id number] DOB: 06/21/1941 DOA: 12/02/2021     6 DOS: the patient was seen and examined on 12/08/2021   Brief hospital course: 81 y.o. female with medical history significant for COPD with chronic respiratory failure on 4 L of oxygen, history of A. fib on anticoagulation therapy, history of chronic low back pain, anxiety and depression, history of chronic combined systolic and diastolic dysfunction CHF with last known LVEF of 35 to 40% from a 2D echocardiogram that was done in 12/22  who was sent to the ER for evaluation from her pulmonologist office due to increased oxygen requirement, patient at baseline wears 4 L of oxygen but is currently on 6 L as well as relative hypotension  6/13: Cardiology and palliative care consulted 6/14: remains on 6 L/min HFNC O2, IV >> PO diuresis 6/15: O2 needs up to 8 L this AM, CXR no infiltrate, back to IV diuressis   Assessment and Plan: * Acute on chronic combined systolic and diastolic CHF (congestive heart failure) ([place]) Presented with worsening shortness of breath from her baseline, progressive bilateral lower extremity swelling. Was also tachycardic with rapid atrial flutter. Chest x-ray shows bilateral pleural effusions and BNP is elevated Acute exacerbation of her underlying CHF appears to be rate related. Echo this admission shows EF of 55 to 60%. --Cardiology managing -- Treated IV Lasix, was transitioned to oral torsemide 40 mg BID 6/14, but resumed on IV Lasix BID 6/15 due to persistent signs of volume overload and increased oxygen requirement --IV Lasix increased to TID (thru 6/18) --Transition to p.o. torsemide 40 mg twice daily --Continue carvedilol (hold if soft BP) --Not on ACEI or ARB due to relative hypotension Net IO Since Admission: -8,685 mL [12/08/21 1528]       Acute on chronic respiratory failure with hypoxia (HCC) Most likely secondary to acute on chronic combined systolic and  diastolic dysfunction CHF. Requires 4 L/,min O2 at baseline.  Presented with increased work of breathing and SPO2 88% on usual 4 L. 6/15: Requiring 6 >> 8 >> 10 L/min O2 6/16: Improved oxygen requirement weaned to her baseline 4 L since yesterday 6/17>>18: Stable on 3.5 - 4 L/min O2 --Continue 4 L/min [place] O2 --Supplemental O2 as needed, target sats 90 to 94% --Manage CHF as outlined with diuresis  Atrial flutter with rapid ventricular response ([place]) Patient with a history of atrial flutter, presented with worsening shortness of breath was found in a flutter with RVR. --Continue digoxin and metoprolol  -- Cardiology following --Continue Eliquis  Hypothyroid TSH 1.9 Continue Synthroid  Obstructive sleep apnea Continue CPAP at bedtime.  Chronic low back pain Continue morphine 7.5 mg every 6 hours as needed for pain control, also on Neurontin  Obesity (BMI 30-39.9) Patient's BMI is 96.22 Complicates overall prognosis and care Lifestyle modification and exercise has been discussed with him in detail.  Thrombocytopenia (HCC) Platelets have been slowly downtrending 105 >> 97 >> 95 >> 112 >> 101 -- Monitor CBC  Goals of care, counseling/discussion Palliative care consulted  Anxiety and depression Continue Celexa  Acute on chronic heart failure with preserved ejection fraction (HFpEF) ([place]) As outlined  Physical deconditioning PT and OT recommending home health. TOC following.  Patient from [facility] assisted living.          Subjective: Patient sleeping notably when seen on rounds.  She wakes briefly to voice and reports being tired but denies any other complaints.  No acute events reported  Physical  Exam: Vitals:   12/08/21 0605 12/08/21 0746 12/08/21 1018 12/08/21 1139  BP:  98/66 93/63 92/68   Pulse:  (!) 104  66  Resp:  20  20  Temp:  98 F (36.7 C)  97.7 F (36.5 C)  TempSrc:    Oral  SpO2:  95%  94%  Weight: 90.1 kg     Height:       General exam:  Sleeping comfortably, wakes briefly to voice, no acute distress, chronically ill-appearing HEENT: Hearing grossly normal, eyes closed Respiratory system: Diminished bases bilaterally otherwise lungs clear with no wheezes or rhonchi heard, normal respiratory effort at rest on 3.5 L/min [place] O2, Cardiovascular system: Irregularly irregular, trace to 1+ bilateral lower extremity edema appears stable to improved Central nervous system: Exam limited due to somnolence, grossly nonfocal, normal speech Extremities: moves all, bilateral lower extremity edema as above, mild lower extremity venous stasis changes Psychiatry: To assess due to somnolence   Data Reviewed:  Notable labs:  Chloride 96, bicarb 38, glucose 115   Family Communication: None at bedside but will attempt to call  Disposition: Status is: Inpatient Remains inpatient appropriate because: Remains on IV diuresis per cardiology.  Discharge pending transition to oral diuretic, stable volume status and cleared by cardiology   Planned Discharge Destination: Home with Home Health    Time spent: 35 minutes  Author: Ezekiel Slocumb, DO 12/08/2021 3:30 PM  For on call review [email or web address].

## 2021-12-08 NOTE — Progress Notes (Signed)
Progress Note  Patient Name: [name] Date of Encounter: 12/08/2021  [facility] Cardiologist: Peter Martinique, MD   Subjective   Feels a little tired this morning, denies chest pain or shortness of breath, wondering when she can go home.  Net -1.6 L over the past 24 hours, creatinine normal.  Inpatient Medications    Scheduled Meds:  apixaban  5 mg Oral BID   cholecalciferol  1,000 Units Oral Daily   citalopram  20 mg Oral Daily   digoxin  0.0625 mg Oral Daily   feeding supplement  237 mL Oral BID BM   gabapentin  100 mg Oral TID   levothyroxine  88 mcg Oral Daily   melatonin  5 mg Oral QHS   metoprolol tartrate  12.5 mg Oral BID   multivitamin with minerals  1 tablet Oral Daily   pantoprazole  40 mg Oral Daily   polyethylene glycol  17 g Oral Daily   potassium chloride SA  20 mEq Oral BID   senna-docusate  1 tablet Oral BID   torsemide  40 mg Oral BID   vitamin B-12  100 mcg Oral Daily   Continuous Infusions:  PRN Meds: acetaminophen, ipratropium-albuterol, morphine, ondansetron **OR** ondansetron (ZOFRAN) IV   Vital Signs    Vitals:   12/08/21 0605 12/08/21 0746 12/08/21 1018 12/08/21 1139  BP:  98/66 93/63 92/68   Pulse:  (!) 104  66  Resp:  20  20  Temp:  98 F (36.7 C)  97.7 F (36.5 C)  TempSrc:    Oral  SpO2:  95%  94%  Weight: 90.1 kg     Height:        Intake/Output Summary (Last 24 hours) at 12/08/2021 1143 Last data filed at 12/08/2021 0606 Gross per 24 hour  Intake 240 ml  Output 1625 ml  Net -1385 ml      12/08/2021    6:05 AM 12/07/2021    5:49 AM 12/06/2021    5:00 AM  Last 3 Weights  Weight (lbs) 198 lb 10.2 oz 201 lb 4.5 oz 201 lb 15.1 oz  Weight (kg) 90.1 kg 91.3 kg 91.6 kg      Telemetry    Atrial fibrillation, heart rate 88- Personally Reviewed  ECG     - Personally Reviewed  Physical Exam   GEN: No acute distress.   Neck: No JVD Cardiac: Irregular irregular Respiratory: Diminished breath sounds at bases GI:  Soft, nontender, non-distended  MS: No edema; No deformity. Neuro:  Nonfocal  Psych: Normal affect   Labs    High Sensitivity Troponin:   Recent Labs  Lab 12/02/21 1215  TROPONINIHS 18*     Chemistry Recent Labs  Lab 12/02/21 1215 12/03/21 0611 12/06/21 0514 12/07/21 0801 12/08/21 0536  NA 139   < > 140 139 140  K 4.2   < > 4.0 4.2 3.8  CL 102   < > 98 95* 96*  CO2 32   < > 37* 38* 38*  GLUCOSE 122*   < > 128* 110* 115*  BUN 14   < > 21 17 16   CREATININE 0.73   < > 0.73 0.86 0.77  CALCIUM 9.1   < > 8.8* 9.4 9.2  MG 2.1  --  2.0  --   --   GFRNONAA >60   < > >60 >60 >60  ANIONGAP 5   < > 5 6 6    < > = values in this interval not  displayed.    Lipids No results for input(s): "CHOL", "TRIG", "HDL", "LABVLDL", "LDLCALC", "CHOLHDL" in the last 168 hours.  Hematology Recent Labs  Lab 12/04/21 0607 12/05/21 0614 12/06/21 0514  WBC 5.1 6.3 5.3  RBC 4.24 4.31 4.25  HGB 14.8 14.7 14.8  HCT 45.2 45.3 45.3  MCV 106.6* 105.1* 106.6*  MCH 34.9* 34.1* 34.8*  MCHC 32.7 32.5 32.7  RDW 13.1 12.9 12.8  PLT 95* 112* 101*   Thyroid  Recent Labs  Lab 12/02/21 1215  TSH 1.918    BNP Recent Labs  Lab 12/02/21 1215  BNP 367.6*    DDimer No results for input(s): "DDIMER" in the last 168 hours.   Radiology    No results found.  Cardiac Studies   TTE 12/03/2021 1. Left ventricular ejection fraction, by estimation, is 55 to 60%. The  left ventricle has normal function. Left ventricular endocardial border  not optimally defined to evaluate regional wall motion. There is mild left  ventricular hypertrophy. Left  ventricular diastolic parameters are indeterminate.   2. Right ventricular systolic function is mildly reduced. The right  ventricular size is moderately enlarged. There is severely elevated  pulmonary artery systolic pressure.   3. Left atrial size was moderately dilated.   4. Right atrial size was moderately dilated.   5. The mitral valve is degenerative.  Trivial mitral valve regurgitation.  Moderate mitral annular calcification.   Patient Profile     81 y.o. female with history of HFpEF, permanent A-fib, non-small cell lung cancer, COPD on oxygen presenting with worsening shortness of breath, being seen for HFpEF.  Assessment & Plan    1.  HFpEF -Edema better, creatinine normal -Transition to oral torsemide 40 mg twice daily -Needs PT eval for safe dispo   2.  Permanent atrial fibrillation -Heart rate controlled -Continue Lopressor 12.5 mg twice daily, digoxin, Eliquis.   3.  COPD on oxygen, respiratory failure -Management as per medicine team   Greater than 50% was spent in counseling and coordination of care with patient Total encounter time 50 minutes or more       Signed, Kate Sable, MD  12/08/2021, 11:43 AM

## 2021-12-09 DIAGNOSIS — I5043 Acute on chronic combined systolic (congestive) and diastolic (congestive) heart failure: Secondary | ICD-10-CM | POA: Diagnosis not present

## 2021-12-09 LAB — CBC
HCT: 47.5 % — ABNORMAL HIGH (ref 36.0–46.0)
Hemoglobin: 15.9 g/dL — ABNORMAL HIGH (ref 12.0–15.0)
MCH: 35.2 pg — ABNORMAL HIGH (ref 26.0–34.0)
MCHC: 33.5 g/dL (ref 30.0–36.0)
MCV: 105.1 fL — ABNORMAL HIGH (ref 80.0–100.0)
Platelets: 123 10*3/uL — ABNORMAL LOW (ref 150–400)
RBC: 4.52 MIL/uL (ref 3.87–5.11)
RDW: 12.9 % (ref 11.5–15.5)
WBC: 5.7 10*3/uL (ref 4.0–10.5)
nRBC: 0 % (ref 0.0–0.2)

## 2021-12-09 LAB — BASIC METABOLIC PANEL
Anion gap: 5 (ref 5–15)
BUN: 17 mg/dL (ref 8–23)
CO2: 37 mmol/L — ABNORMAL HIGH (ref 22–32)
Calcium: 9.2 mg/dL (ref 8.9–10.3)
Chloride: 96 mmol/L — ABNORMAL LOW (ref 98–111)
Creatinine, Ser: 0.9 mg/dL (ref 0.44–1.00)
GFR, Estimated: >60 mL/min (ref >60)
Glucose, Bld: 98 mg/dL (ref 70–99)
Potassium: 4.1 mmol/L (ref 3.5–5.1)
Sodium: 138 mmol/L (ref 135–145)

## 2021-12-09 MED ORDER — POLYETHYLENE GLYCOL 3350 17 G PO PACK: 17.0000 g | PACK | Freq: Every day | ORAL | 0 refills | Status: AC | PRN

## 2021-12-09 MED ORDER — ENSURE ENLIVE PO LIQD: 237.0000 mL | Freq: Two times a day (BID) | ORAL | 12 refills | Status: AC

## 2021-12-09 MED ORDER — ADULT MULTIVITAMIN W/MINERALS CH: 1.0000 | ORAL_TABLET | Freq: Every day | ORAL | Status: DC

## 2021-12-09 MED ORDER — TORSEMIDE 40 MG PO TABS: 40.0000 mg | ORAL_TABLET | Freq: Two times a day (BID) | ORAL | 1 refills | Status: AC

## 2021-12-09 NOTE — TOC Transition Note (Signed)
Transition of Care [facility]) - CM/SW Discharge Note   Patient Details  Name: [name] MRN: [id number] Date of Birth: [birth year]  [facility]) CM/SW Contact:  Laurena Slimmer, RN Phone Number: 12/09/2021, 3:41 PM   Clinical Narrative:    EMS arranged. Patient notified. TOC signing off      Barriers to Discharge: Barriers Resolved   Patient Goals and CMS Choice     Choice offered to / list presented to : Patient  Discharge Placement                    Patient and family notified of of transfer: 12/07/21 (Spoke with pt and offered to call Anne Ng (daughter))  Discharge Plan and Services                          HH Arranged: PT [facility] Agency: [facility] Elliot Cousin) Date [place]: 12/07/21 Time [place]: 1330 Representative spoke with at [place]: [place] Determinants of Health ([place]) Interventions     Readmission Risk Interventions     No data to display

## 2021-12-09 NOTE — Plan of Care (Signed)
Problem: Education: Goal: Knowledge of General Education information will improve Description: Including pain rating scale, medication(s)/side effects and non-pharmacologic comfort measures 12/09/2021 1630 by Alen Blew, RN Outcome: Adequate for Discharge 12/09/2021 1630 by Alen Blew, RN Outcome: Adequate for Discharge   Problem: Health Behavior/Discharge Planning: Goal: Ability to manage health-related needs will improve 12/09/2021 1630 by Alen Blew, RN Outcome: Adequate for Discharge 12/09/2021 1630 by Alen Blew, RN Outcome: Adequate for Discharge   Problem: Clinical Measurements: Goal: Ability to maintain clinical measurements within normal limits will improve 12/09/2021 1630 by Roanna Epley D, RN Outcome: Adequate for Discharge 12/09/2021 1630 by Alen Blew, RN Outcome: Adequate for Discharge Goal: Will remain free from infection 12/09/2021 1630 by Roanna Epley D, RN Outcome: Adequate for Discharge 12/09/2021 1630 by Alen Blew, RN Outcome: Adequate for Discharge Goal: Diagnostic test results will improve 12/09/2021 1630 by Alen Blew, RN Outcome: Adequate for Discharge 12/09/2021 1630 by Alen Blew, RN Outcome: Adequate for Discharge Goal: Respiratory complications will improve 12/09/2021 1630 by Alen Blew, RN Outcome: Adequate for Discharge 12/09/2021 1630 by Alen Blew, RN Outcome: Adequate for Discharge Goal: Cardiovascular complication will be avoided 12/09/2021 1630 by Roanna Epley D, RN Outcome: Adequate for Discharge 12/09/2021 1630 by Alen Blew, RN Outcome: Adequate for Discharge   Problem: Activity: Goal: Risk for activity intolerance will decrease 12/09/2021 1630 by Alen Blew, RN Outcome: Adequate for Discharge 12/09/2021 1630 by Alen Blew, RN Outcome: Adequate for Discharge   Problem: Nutrition: Goal: Adequate nutrition will be maintained 12/09/2021 1630 by Alen Blew,  RN Outcome: Adequate for Discharge 12/09/2021 1630 by Alen Blew, RN Outcome: Adequate for Discharge   Problem: Coping: Goal: Level of anxiety will decrease 12/09/2021 1630 by Alen Blew, RN Outcome: Adequate for Discharge 12/09/2021 1630 by Alen Blew, RN Outcome: Adequate for Discharge   Problem: Elimination: Goal: Will not experience complications related to bowel motility 12/09/2021 1630 by Alen Blew, RN Outcome: Adequate for Discharge 12/09/2021 1630 by Alen Blew, RN Outcome: Adequate for Discharge Goal: Will not experience complications related to urinary retention 12/09/2021 1630 by Alen Blew, RN Outcome: Adequate for Discharge 12/09/2021 1630 by Alen Blew, RN Outcome: Adequate for Discharge   Problem: Pain Managment: Goal: General experience of comfort will improve 12/09/2021 1630 by Alen Blew, RN Outcome: Adequate for Discharge 12/09/2021 1630 by Alen Blew, RN Outcome: Adequate for Discharge   Problem: Safety: Goal: Ability to remain free from injury will improve 12/09/2021 1630 by Roanna Epley D, RN Outcome: Adequate for Discharge 12/09/2021 1630 by Alen Blew, RN Outcome: Adequate for Discharge   Problem: Education: Goal: Ability to demonstrate management of disease process will improve 12/09/2021 1630 by Roanna Epley D, RN Outcome: Adequate for Discharge 12/09/2021 1630 by Alen Blew, RN Outcome: Adequate for Discharge Goal: Ability to verbalize understanding of medication therapies will improve 12/09/2021 1630 by Alen Blew, RN Outcome: Adequate for Discharge 12/09/2021 1630 by Alen Blew, RN Outcome: Adequate for Discharge Goal: Individualized Educational Video(s) 12/09/2021 1630 by Alen Blew, RN Outcome: Adequate for Discharge 12/09/2021 1630 by Alen Blew, RN Outcome: Adequate for Discharge   Problem: Activity: Goal: Capacity to carry out activities will  improve 12/09/2021 1630 by Alen Blew, RN Outcome: Adequate for Discharge 12/09/2021 1630 by Alen Blew, RN Outcome: Adequate for Discharge   Problem: Cardiac: Goal: Ability to achieve and maintain  adequate cardiopulmonary perfusion will improve 12/09/2021 1630 by Alen Blew, RN Outcome: Adequate for Discharge 12/09/2021 1630 by Alen Blew, RN Outcome: Adequate for Discharge   Problem: Acute Rehab PT Goals(only PT should resolve) Goal: Pt Will Transfer Bed To Chair/Chair To Bed Outcome: Adequate for Discharge Goal: Pt Will Ambulate Outcome: Adequate for Discharge   Problem: Inadequate Intake (NI-2.1) Goal: Food and/or nutrient delivery Description: Individualized approach for food/nutrient provision. Outcome: Adequate for Discharge

## 2021-12-09 NOTE — TOC Transition Note (Signed)
Transition of Care [facility]) - CM/SW Discharge Note   Patient Details  Name: [name] MRN: [id number] Date of Birth: May 14, 1941  Transition of Care [facility]) CM/SW Contact:  Laurena Slimmer, RN Phone Number: 12/09/2021, 3:36 PM   Clinical Narrative:    Spoke with Daughter to get name of pharmacy. Daughter stated patient uses [facility] [place]. Patient will be transported by EMS.  Spoke with Lattie Haw at [place] to advised of patient discharge today. FL2 sent in hub. Patient notified of EMS transport     Barriers to Discharge: Barriers Resolved   Patient Goals and CMS Choice     Choice offered to / list presented to : Patient  Discharge Placement                    Patient and family notified of of transfer: 12/07/21 (Spoke with pt and offered to call Anne Ng (daughter))  Discharge Plan and Services                          HH Arranged: PT [facility] Agency: [facility] Elliot Cousin) Date [place]: 12/07/21 Time [place]: 1330 Representative spoke with at [place]: [place] Determinants of Health ([place]) Interventions     Readmission Risk Interventions     No data to display

## 2021-12-09 NOTE — Discharge Summary (Signed)
Physician Discharge Summary   Patient: [name] MRN: [id number] DOB: 1941-06-20  Admit date:     12/02/2021  Discharge date: 12/09/21  Discharge Physician: Ezekiel Slocumb   PCP: Jinny Sanders, MD   Recommendations at discharge:   Follow-up with cardiology as scheduled Follow-up with primary care in 1 week Repeat BMP, CBC, Mg in 1 week  Discharge Diagnoses: Principal Problem:   Acute on chronic combined systolic and diastolic CHF (congestive heart failure) (HCC) Active Problems:   Acute on chronic respiratory failure with hypoxia (HCC)   Atrial flutter with rapid ventricular response (HCC)   Hypothyroid   Obstructive sleep apnea   Chronic low back pain   Obesity (BMI 30-39.9)   Physical deconditioning   Acute on chronic heart failure with preserved ejection fraction (HFpEF) (HCC)   Anxiety and depression   Goals of care, counseling/discussion   Thrombocytopenia ([place])   Permanent atrial fibrillation (HCC)  Resolved Problems:   * No resolved hospital problems. *  Hospital Course: 81 y.o. female with medical history significant for COPD with chronic respiratory failure on 4 L of oxygen, history of A. fib on anticoagulation therapy, history of chronic low back pain, anxiety and depression, history of chronic combined systolic and diastolic dysfunction CHF with last known LVEF of 35 to 40% from a 2D echocardiogram that was done in 12/22  who was sent to the ER for evaluation from her pulmonologist office due to increased oxygen requirement, patient at baseline wears 4 L of oxygen but is currently on 6 L as well as relative hypotension  6/13: Cardiology and palliative care consulted 6/14: remains on 6 L/min HFNC O2, IV >> PO diuresis 6/15: O2 needs up to 8 L this AM, CXR no infiltrate, back to IV diuressis 6/16-18: Further IV diuresis until euvolemic  6/19: Patient remains on her baseline oxygen requirement, no longer with shortness of breath and edema has significantly  improved.  Medically stable and cleared by cardiology for discharge.  Home health PT and OT recommended and being arranged by case management.  Assessment and Plan: * Acute on chronic combined systolic and diastolic CHF (congestive heart failure) (HCC) Presented with worsening shortness of breath from her baseline, progressive bilateral lower extremity swelling. Was also tachycardic with rapid atrial flutter. Chest x-ray shows bilateral pleural effusions and BNP is elevated Acute exacerbation of her underlying CHF appears to be rate related. Echo this admission shows EF of 55 to 60%. --Cardiology managing -- Treated IV Lasix, was transitioned to oral torsemide 40 mg BID 6/14, but resumed on IV Lasix BID 6/15 due to persistent signs of volume overload and increased oxygen requirement --IV Lasix increased to TID (thru 6/18) -- Continue to p.o. torsemide 40 mg twice daily --Continue carvedilol (hold if soft BP) --Not on ACEI or ARB due to relative hypotension  Net IO Since Admission: -9,666 mL [12/09/21 1444]        Acute on chronic respiratory failure with hypoxia (HCC) Most likely secondary to acute on chronic combined systolic and diastolic dysfunction CHF. Requires 4 L/,min O2 at baseline.  Presented with increased work of breathing and SPO2 88% on usual 4 L. 6/15: Requiring 6 >> 8 >> 10 L/min O2 6/16: Improved oxygen requirement weaned to her baseline 4 L since yesterday 6/17>>18: Stable on 3.5 - 4 L/min O2 --Continue 4 L/min [place] O2 --Supplemental O2 as needed, target sats 90 to 94% --Manage CHF as outlined with diuresis  Atrial flutter with rapid ventricular response (  [place]) Patient with a history of atrial flutter, presented with worsening shortness of breath was found in a flutter with RVR. --Continue digoxin and metoprolol  -- Cardiology following --Continue Eliquis  Hypothyroid TSH 1.9 Continue Synthroid  Obstructive sleep apnea Continue CPAP at bedtime.  Chronic  low back pain Continue morphine 7.5 mg every 6 hours as needed for pain control, also on Neurontin  Obesity (BMI 30-39.9) Patient's BMI is 61.60 Complicates overall prognosis and care Lifestyle modification and exercise has been discussed with him in detail.  Thrombocytopenia (HCC) Platelets have been slowly downtrending 105 >> 97 >> 95 >> 112 >> 101 -- Monitor CBC  Goals of care, counseling/discussion Palliative care consulted  Anxiety and depression Continue Celexa  Acute on chronic heart failure with preserved ejection fraction (HFpEF) ([place]) As outlined  Physical deconditioning PT and OT recommending home health. TOC following.  Patient from [facility].           Consultants: Cardiology Procedures performed: Echocardiogram 6/13 -EF 55 to 60%, mild LVH, indeterminate diastolic parameters, mildly reduced RV systolic function, moderately enlarged right ventricle, severely elevated PASP, both atria moderately dilated, moderate to severe TR  Disposition: Assisted living with home health PT OT  Diet recommendation:  Cardiac diet   DISCHARGE MEDICATION: Allergies as of 12/09/2021   No Known Allergies      Medication List     STOP taking these medications    furosemide 40 MG tablet Commonly known as: LASIX       TAKE these medications    acetaminophen 500 MG tablet Commonly known as: TYLENOL Take 2 tablets (1,000 mg total) by mouth every 6 (six) hours as needed for fever, headache, mild pain or moderate pain (backpain).   albuterol 108 (90 Base) MCG/ACT inhaler Commonly known as: Ventolin HFA TAKE 2 PUFFS EVERY 6 HOURS AS NEEDED FORSHORTNESS OF BREATH/WHEEZING   apixaban 5 MG Tabs tablet Commonly known as: ELIQUIS Take 1 tablet (5 mg total) by mouth 2 (two) times daily.   citalopram 20 MG tablet Commonly known as: CELEXA Take 20 mg by mouth daily.   digoxin 0.125 MG tablet Commonly known as: LANOXIN Take 0.5 tablets (0.0625 mg  total) by mouth daily.   feeding supplement Liqd Take 237 mLs by mouth 2 (two) times daily between meals.   gabapentin 100 MG capsule Commonly known as: NEURONTIN Take 1 capsule by mouth 3 (three) times daily.   ipratropium-albuterol 0.5-2.5 (3) MG/3ML Soln Commonly known as: DUONEB Take 3 mLs by nebulization 2 (two) times daily.   levalbuterol 45 MCG/ACT inhaler Commonly known as: XOPENEX HFA Inhale 2 puffs into the lungs every 8 (eight) hours as needed.   levothyroxine 88 MCG tablet Commonly known as: SYNTHROID Take 88 mcg by mouth daily.   melatonin 5 MG Tabs Take 5 mg by mouth at bedtime.   metoprolol tartrate 25 MG tablet Commonly known as: LOPRESSOR TAKE 1/2 TABLET TWICE DAILY   morphine 15 MG tablet Commonly known as: MSIR Take 0.5 tablets (7.5 mg total) by mouth every 6 (six) hours as needed.   multivitamin with minerals Tabs tablet Take 1 tablet by mouth daily. Start taking on: December 10, 2021   OXYGEN Inhale 4 L into the lungs daily. Patient states she wears 2L at night and 4L when up and moving   pantoprazole 40 MG tablet Commonly known as: PROTONIX Take 1 tablet (40 mg total) by mouth daily.   polyethylene glycol 17 g packet Commonly known as: MIRALAX /  GLYCOLAX Take 17 g by mouth daily as needed.   potassium chloride SA 20 MEQ tablet Commonly known as: KLOR-CON M Take 20 mEq by mouth 2 (two) times daily.   Spiriva HandiHaler 18 MCG inhalation capsule Generic drug: tiotropium Place 1 capsule (18 mcg total) into inhaler and inhale daily.   Torsemide 40 MG Tabs Take 40 mg by mouth 2 (two) times daily.   vitamin B-12 100 MCG tablet Commonly known as: CYANOCOBALAMIN Take 100 mcg by mouth daily.   Vitamin D 1000 units capsule Take 1,000 Units by mouth daily.        Discharge Exam: Filed Weights   12/07/21 0549 12/08/21 0605 12/09/21 0529  Weight: 91.3 kg 90.1 kg 88.6 kg   General exam: awake, alert, no acute distress HEENT: atraumatic,  clear conjunctiva, anicteric sclera, moist mucus membranes, hearing grossly normal  Respiratory system: CTAB but diminished throughout, no wheezes, rales or rhonchi, normal respiratory effort.  On 4 L/min [place] O2 Cardiovascular system: normal S1/S2, RRR, no JVD, murmurs, rubs, gallops, trace left lower extremity edema, no right lower extremity edema.   Gastrointestinal system: soft, NT, ND, no HSM felt, +bowel sounds. Central nervous system: A&O x3. no gross focal neurologic deficits, normal speech Extremities: moves all, no cyanosis, normal tone Skin: dry, intact, normal temperature, normal color, No rashes, lesions or ulcers seen on visualized skin Psychiatry: normal mood, congruent affect, judgement and insight appear normal   Condition at discharge: stable  The results of significant diagnostics from this hospitalization (including imaging, microbiology, ancillary and laboratory) are listed below for reference.   Imaging Studies: DG Chest Port 1 View  Result Date: 12/05/2021 CLINICAL DATA:  Acute on chronic respiratory failure with hypoxia EXAM: PORTABLE CHEST 1 VIEW COMPARISON:  12/02/2021 FINDINGS: Chronic cardiomegaly and aortic atherosclerosis. Chronic lung disease with interstitial markings. No evidence of consolidation, collapse or effusion. No acute bone finding. IMPRESSION: Chronic cardiomegaly and aortic atherosclerosis. Chronic interstitial lung disease. No visible consolidation or collapse. Electronically Signed   By: Nelson Chimes M.D.   On: 12/05/2021 09:58   ECHOCARDIOGRAM COMPLETE  Result Date: 12/03/2021    ECHOCARDIOGRAM REPORT   Patient Name:   TAKYIA SINDT Date of Exam: 12/03/2021 Medical Rec #:  [id number]        Height:       60.0 in Accession #:    [id number]       Weight:       203.0 lb Date of Birth:  February 13, 1941        BSA:          1.879 m Patient Age:    48 years         BP:           104/64 mmHg Patient Gender: F                HR:           87 bpm. Exam Location:   [facility] Procedure: 2D Echo, Cardiac Doppler and Color Doppler Indications:     CHF I50.9  History:         Patient has prior history of Echocardiogram examinations, most                  recent 06/18/2021. COPD, Arrythmias:Atrial Fibrillation; Risk                  Factors:Hypertension.  Sonographer:     Sherrie Sport Referring Phys:  [id number] SHERI HAMMOCK Diagnosing Phys:  Nelva Bush MD  Sonographer Comments: Suboptimal parasternal window. IMPRESSIONS  1. Left ventricular ejection fraction, by estimation, is 55 to 60%. The left ventricle has normal function. Left ventricular endocardial border not optimally defined to evaluate regional wall motion. There is mild left ventricular hypertrophy. Left ventricular diastolic parameters are indeterminate.  2. Right ventricular systolic function is mildly reduced. The right ventricular size is moderately enlarged. There is severely elevated pulmonary artery systolic pressure.  3. Left atrial size was moderately dilated.  4. Right atrial size was moderately dilated.  5. The mitral valve is degenerative. Trivial mitral valve regurgitation. Moderate mitral annular calcification.  6. The tricuspid valve is abnormal. Tricuspid valve regurgitation is moderate to severe.  7. The aortic valve is tricuspid. There is mild thickening of the aortic valve. Aortic valve regurgitation is not visualized. Aortic valve sclerosis is present, with no evidence of aortic valve stenosis.  8. The inferior vena cava is dilated in size with <50% respiratory variability, suggesting right atrial pressure of 15 mmHg. FINDINGS  Left Ventricle: Left ventricular ejection fraction, by estimation, is 55 to 60%. The left ventricle has normal function. Left ventricular endocardial border not optimally defined to evaluate regional wall motion. The left ventricular internal cavity size was normal in size. There is mild left ventricular hypertrophy. Left ventricular diastolic function could not be evaluated due to  atrial fibrillation. Left ventricular diastolic parameters are indeterminate. Right Ventricle: The right ventricular size is moderately enlarged. Right vetricular wall thickness was not well visualized. Right ventricular systolic function is mildly reduced. There is severely elevated pulmonary artery systolic pressure. The tricuspid regurgitant velocity is 3.60 m/s, and with an assumed right atrial pressure of 15 mmHg, the estimated right ventricular systolic pressure is 35.5 mmHg. Left Atrium: Left atrial size was moderately dilated. Right Atrium: Right atrial size was moderately dilated. Pericardium: There is no evidence of pericardial effusion. Mitral Valve: The mitral valve is degenerative in appearance. There is mild calcification of the mitral valve leaflet(s). Moderate mitral annular calcification. Trivial mitral valve regurgitation. MV peak gradient, 3.2 mmHg. The mean mitral valve gradient is 1.0 mmHg. Tricuspid Valve: The tricuspid valve is abnormal. Tricuspid valve regurgitation is moderate to severe. Aortic Valve: The aortic valve is tricuspid. There is mild thickening of the aortic valve. Aortic valve regurgitation is not visualized. Aortic valve sclerosis is present, with no evidence of aortic valve stenosis. Aortic valve mean gradient measures 2.0  mmHg. Aortic valve peak gradient measures 4.7 mmHg. Aortic valve area, by VTI measures 2.01 cm. Pulmonic Valve: The pulmonic valve was grossly normal. Pulmonic valve regurgitation is trivial. No evidence of pulmonic stenosis. Aorta: The aortic root is normal in size and structure. Pulmonary Artery: The pulmonary artery is not well seen. Venous: The inferior vena cava is dilated in size with less than 50% respiratory variability, suggesting right atrial pressure of 15 mmHg. IAS/Shunts: The interatrial septum was not well visualized.  LEFT VENTRICLE PLAX 2D LVIDd:         3.30 cm LVIDs:         2.30 cm LV PW:         1.10 cm LV IVS:        1.09 cm LVOT diam:      2.00 cm LV SV:         34 LV SV Index:   18 LVOT Area:     3.14 cm  RIGHT VENTRICLE RV Basal diam:  5.01 cm RV S prime:  10.90 cm/s TAPSE (M-mode): 1.7 cm LEFT ATRIUM             Index        RIGHT ATRIUM           Index LA diam:        4.20 cm 2.24 cm/m   RA Area:     27.20 cm LA Vol (A2C):   91.0 ml 48.44 ml/m  RA Volume:   95.80 ml  50.99 ml/m LA Vol (A4C):   77.8 ml 41.41 ml/m LA Biplane Vol: 90.7 ml 48.28 ml/m  AORTIC VALVE                    PULMONIC VALVE AV Area (Vmax):    2.08 cm     PV Vmax:        0.58 m/s AV Area (Vmean):   2.04 cm     PV Vmean:       41.400 cm/s AV Area (VTI):     2.01 cm     PV VTI:         0.091 m AV Vmax:           108.50 cm/s  PV Peak grad:   1.4 mmHg AV Vmean:          70.000 cm/s  PV Mean grad:   1.0 mmHg AV VTI:            0.168 m      RVOT Peak grad: 2 mmHg AV Peak Grad:      4.7 mmHg AV Mean Grad:      2.0 mmHg LVOT Vmax:         71.80 cm/s LVOT Vmean:        45.400 cm/s LVOT VTI:          0.108 m LVOT/AV VTI ratio: 0.64  AORTA Ao Root diam: 3.30 cm MITRAL VALVE                TRICUSPID VALVE MV Area (PHT): 4.24 cm     TR Peak grad:   51.8 mmHg MV Area VTI:   1.77 cm     TR Vmax:        360.00 cm/s MV Peak grad:  3.2 mmHg MV Mean grad:  1.0 mmHg     SHUNTS MV Vmax:       0.89 m/s     Systemic VTI:  0.11 m MV Vmean:      49.7 cm/s    Systemic Diam: 2.00 cm MV Decel Time: 179 msec     Pulmonic VTI:  0.105 m MV E velocity: 105.00 cm/s Nelva Bush MD Electronically signed by Nelva Bush MD Signature Date/Time: 12/03/2021/1:13:06 PM    Final    DG Chest 2 View  Result Date: 12/02/2021 CLINICAL DATA:  Shortness of breath, low O2 sats. EXAM: CHEST - 2 VIEW COMPARISON:  07/15/2021 and CT chest 02/14/2020. FINDINGS: Trachea is midline. Heart is enlarged. Thoracic aorta is calcified. Basilar interstitial prominence and indistinctness. Tiny bilateral pleural effusions. Degenerative changes in the left shoulder. IMPRESSION: Congestive heart failure.  Electronically Signed   By: Lorin Picket M.D.   On: 12/02/2021 12:41    Microbiology: Results for orders placed or performed during the hospital encounter of 12/02/21  SARS Coronavirus 2 by RT PCR (hospital order, performed in [facility] hospital lab) *cepheid single result test* Anterior Nasal Swab     Status: None   Collection Time: 12/02/21 12:15 PM  Specimen: Anterior Nasal Swab  Result Value Ref Range Status   SARS Coronavirus 2 by RT PCR NEGATIVE NEGATIVE Final    Comment: (NOTE) SARS-CoV-2 target nucleic acids are NOT DETECTED.  The SARS-CoV-2 RNA is generally detectable in upper and lower respiratory specimens during the acute phase of infection. The lowest concentration of SARS-CoV-2 viral copies this assay can detect is 250 copies / mL. A negative result does not preclude SARS-CoV-2 infection and should not be used as the sole basis for treatment or other patient management decisions.  A negative result may occur with improper specimen collection / handling, submission of specimen other than nasopharyngeal swab, presence of viral mutation(s) within the areas targeted by this assay, and inadequate number of viral copies (<250 copies / mL). A negative result must be combined with clinical observations, patient history, and epidemiological information.  Fact Sheet for Patients:   https://www.patel.info/  Fact Sheet for Healthcare Providers: https://hall.com/  This test is not yet approved or  cleared by the [place] FDA and has been authorized for detection and/or diagnosis of SARS-CoV-2 by FDA under an Emergency Use Authorization (EUA).  This EUA will remain in effect (meaning this test can be used) for the duration of the COVID-19 declaration under Section 564(b)(1) of the Act, 21 U.S.C. section 360bbb-3(b)(1), unless the authorization is terminated or revoked sooner.  Performed at [facility], [address] [place], [place] [address]     Labs: CBC: Recent Labs  Lab 12/03/21 [phone] 12/04/21 [phone] 12/05/21 0614 12/06/21 0514 12/09/21 0628  WBC 4.7 5.1 6.3 5.3 5.7  HGB 14.7 14.8 14.7 14.8 15.9*  HCT 44.9 45.2 45.3 45.3 47.5*  MCV 104.7* 106.6* 105.1* 106.6* 105.1*  PLT 97* 95* 112* 101* 081*   Basic Metabolic Panel: Recent Labs  Lab 12/05/21 0614 12/06/21 0514 12/07/21 0801 12/08/21 0536 12/09/21 0628  NA 137 140 139 140 138  K 4.0 4.0 4.2 3.8 4.1  CL 97* 98 95* 96* 96*  CO2 36* 37* 38* 38* 37*  GLUCOSE 114* 128* 110* 115* 98  BUN 21 21 17 16 17   CREATININE 0.76 0.73 0.86 0.77 0.90  CALCIUM 9.1 8.8* 9.4 9.2 9.2  MG  --  2.0  --   --   --    Liver Function Tests: No results for input(s): "AST", "ALT", "ALKPHOS", "BILITOT", "PROT", "ALBUMIN" in the last 168 hours. CBG: No results for input(s): "GLUCAP" in the last 168 hours.  Discharge time spent: greater than 30 minutes.  Signed: Ezekiel Slocumb, DO [facility] 12/09/2021

## 2021-12-09 NOTE — NC FL2 (Signed)
[place] LEVEL OF CARE SCREENING TOOL     IDENTIFICATION  Patient Name: [name] Birthdate: [birth year] Sex: female Admission Date (Current Location): 12/02/2021  [facility] and [place] Number:  Engineering geologist and Address:  [facility], [address], [place], [place] [address]      Provider Number: [id number]  Attending Physician Name and Address:  Ezekiel Slocumb, DO  Relative Name and Phone Number:  Deyanira Fesler, [phone]    Current Level of Care: Hospital Recommended Level of Care: [place] Prior Approval Number:    Date Approved/Denied:   PASRR Number:    Discharge Plan:      Current Diagnoses: Patient Active Problem List   Diagnosis Date Noted   Acute respiratory failure with hypoxia (HCC)    Thrombocytopenia ([place]) 12/04/2021   Permanent atrial fibrillation ([place])    Goals of care, counseling/discussion 12/03/2021   Acute on chronic heart failure with preserved ejection fraction (HFpEF) ([place]) 12/02/2021   Acute on chronic combined systolic and diastolic CHF (congestive heart failure) ([place]) 12/02/2021   Atrial flutter with rapid ventricular response ([place]) 12/02/2021   Chronic low back pain 12/02/2021   Obesity (BMI 30-39.9) 12/02/2021   Anxiety and depression    Osteoarthritis of left shoulder 08/30/2021   Falls 07/15/2021   Acute on chronic respiratory failure with hypoxia ([place]) 05/04/2021   Frequent UTI    Anticoagulant long-term use    Physical deconditioning 70/96/2836   Chronic systolic CHF (congestive heart failure) ([place]) 09/15/2016   Hyperlipidemia    Chronic respiratory failure with hypoxia ([place]) 12/03/2014   Atrial fibrillation with RVR (HCC)    Multiple thyroid nodules 10/17/2012   AAA (abdominal aortic aneurysm) ([place]) 05/11/2012   Bradycardia 01/23/2012   Hypothyroid 01/21/2012   HTN (hypertension) 01/21/2012   Obstructive sleep apnea 10/16/2008   History of DVT (deep  vein thrombosis) 08/19/2008   COPD (chronic obstructive pulmonary disease) ([place]) 08/19/2008   NEOPLASM, MALIGNANT, LUNG, HX OF 62/94/7654   Diastolic heart failure, NYHA class 2 ([place]) 08/03/2008   Hyperbilirubinemia 2004    Orientation RESPIRATION BLADDER Height & Weight     Self, Time, Situation, Place  O2 (024L) External catheter Weight: 88.6 kg Height:  5' (152.4 cm)  BEHAVIORAL SYMPTOMS/MOOD NEUROLOGICAL BOWEL NUTRITION STATUS     (n/a) Continent Diet  AMBULATORY STATUS COMMUNICATION OF NEEDS Skin   Limited Assist Verbally Other (Comment) (Ecchymosis bilateral arms)                       Personal Care Assistance Level of Assistance  Bathing, Dressing, Total care Bathing Assistance: Limited assistance   Dressing Assistance: Limited assistance Total Care Assistance: Limited assistance   Functional Limitations Info  Sight Sight Info: Impaired        SPECIAL CARE FACTORS FREQUENCY  PT (By licensed PT), OT (By licensed OT)     PT Frequency: Min 2X/week OT Frequency: Min 2X/week            Contractures Contractures Info: Not present    Additional Factors Info  Code Status, Allergies Code Status Info: FULL CODE Allergies Info: No Known Allergies           Current Medications (12/09/2021):  This is the current hospital active medication list TAKE these medications     acetaminophen 500 MG tablet Commonly known as: TYLENOL Take 2 tablets (1,000 mg total) by mouth every 6 (six) hours as needed for fever, headache,  mild pain or moderate pain (backpain).    albuterol 108 (90 Base) MCG/ACT inhaler Commonly known as: Ventolin HFA TAKE 2 PUFFS EVERY 6 HOURS AS NEEDED FORSHORTNESS OF BREATH/WHEEZING    apixaban 5 MG Tabs tablet Commonly known as: ELIQUIS Take 1 tablet (5 mg total) by mouth 2 (two) times daily.    citalopram 20 MG tablet Commonly known as: CELEXA Take 20 mg by mouth daily.    digoxin 0.125 MG tablet Commonly known as: LANOXIN Take 0.5  tablets (0.0625 mg total) by mouth daily.    feeding supplement Liqd Take 237 mLs by mouth 2 (two) times daily between meals.    gabapentin 100 MG capsule Commonly known as: NEURONTIN Take 1 capsule by mouth 3 (three) times daily.    ipratropium-albuterol 0.5-2.5 (3) MG/3ML Soln Commonly known as: DUONEB Take 3 mLs by nebulization 2 (two) times daily.    levalbuterol 45 MCG/ACT inhaler Commonly known as: XOPENEX HFA Inhale 2 puffs into the lungs every 8 (eight) hours as needed.    levothyroxine 88 MCG tablet Commonly known as: SYNTHROID Take 88 mcg by mouth daily.    melatonin 5 MG Tabs Take 5 mg by mouth at bedtime.    metoprolol tartrate 25 MG tablet Commonly known as: LOPRESSOR TAKE 1/2 TABLET TWICE DAILY    morphine 15 MG tablet Commonly known as: MSIR Take 0.5 tablets (7.5 mg total) by mouth every 6 (six) hours as needed.    multivitamin with minerals Tabs tablet Take 1 tablet by mouth daily. Start taking on: December 10, 2021    OXYGEN Inhale 4 L into the lungs daily. Patient states she wears 2L at night and 4L when up and moving    pantoprazole 40 MG tablet Commonly known as: PROTONIX Take 1 tablet (40 mg total) by mouth daily.    polyethylene glycol 17 g packet Commonly known as: MIRALAX / GLYCOLAX Take 17 g by mouth daily as needed.    potassium chloride SA 20 MEQ tablet Commonly known as: KLOR-CON M Take 20 mEq by mouth 2 (two) times daily.    Spiriva HandiHaler 18 MCG inhalation capsule Generic drug: tiotropium Place 1 capsule (18 mcg total) into inhaler and inhale daily.    Torsemide 40 MG Tabs Take 40 mg by mouth 2 (two) times daily.    vitamin B-12 100 MCG tablet Commonly known as: CYANOCOBALAMIN Take 100 mcg by mouth daily.    Vitamin D 1000 units capsule Take 1,000 Units by mouth daily.        Discharge Medications: Please see discharge summary for a list of discharge medications.  Relevant Imaging Results:  Relevant Lab  Results:   Additional Information Patient's SS# [id number]  Laurena Slimmer, RN

## 2021-12-09 NOTE — Progress Notes (Signed)
Progress Note  Patient Name: [name] Date of Encounter: 12/09/2021  [facility] Cardiologist: Peter Martinique, MD   Subjective   No chest pain or palpitations. Dyspnea improving.  Documented UOP ~ L over the past 24 hours, net -9.6 L for the admission. Weight trend 90.1 to 88.6 kg over the past 24 hours.   Inpatient Medications    Scheduled Meds:  apixaban  5 mg Oral BID   cholecalciferol  1,000 Units Oral Daily   citalopram  20 mg Oral Daily   digoxin  0.0625 mg Oral Daily   feeding supplement  237 mL Oral BID BM   gabapentin  100 mg Oral TID   levothyroxine  88 mcg Oral Daily   melatonin  5 mg Oral QHS   metoprolol tartrate  12.5 mg Oral BID   multivitamin with minerals  1 tablet Oral Daily   pantoprazole  40 mg Oral Daily   polyethylene glycol  17 g Oral Daily   potassium chloride SA  20 mEq Oral BID   senna-docusate  1 tablet Oral BID   torsemide  40 mg Oral BID   vitamin B-12  100 mcg Oral Daily   Continuous Infusions:  PRN Meds: acetaminophen, ipratropium-albuterol, morphine, ondansetron **OR** ondansetron (ZOFRAN) IV   Vital Signs    Vitals:   12/09/21 0059 12/09/21 0521 12/09/21 0529 12/09/21 0726  BP: 100/77 111/81  117/78  Pulse:  (!) 122  90  Resp:    18  Temp: 98.3 F (36.8 C)   97.6 F (36.4 C)  TempSrc:    Oral  SpO2:  94%  95%  Weight:   88.6 kg   Height:        Intake/Output Summary (Last 24 hours) at 12/09/2021 0823 Last data filed at 12/09/2021 0531 Gross per 24 hour  Intake 120 ml  Output 1100 ml  Net -980 ml       12/09/2021    5:29 AM 12/08/2021    6:05 AM 12/07/2021    5:49 AM  Last 3 Weights  Weight (lbs) 195 lb 4.8 oz 198 lb 10.2 oz 201 lb 4.5 oz  Weight (kg) 88.587 kg 90.1 kg 91.3 kg      Telemetry    Atrial fibrillation, ventricular rates in the 90s to low 100s bpm with brief episodes in the 120s bpm - Personally Reviewed  ECG    No new tracings - Personally Reviewed  Physical Exam   GEN: No acute  distress.   Neck: No JVD Cardiac: Irregular irregular, no murmurs, rubs, or gallops Respiratory: Diminished breath sounds at bases GI: Soft, nontender, non-distended  MS: No edema; No deformity. Neuro:  Nonfocal  Psych: Normal affect   Labs    High Sensitivity Troponin:   Recent Labs  Lab 12/02/21 1215  TROPONINIHS 18*      Chemistry Recent Labs  Lab 12/02/21 1215 12/03/21 0611 12/06/21 0514 12/07/21 0801 12/08/21 0536 12/09/21 0628  NA 139   < > 140 139 140 138  K 4.2   < > 4.0 4.2 3.8 4.1  CL 102   < > 98 95* 96* 96*  CO2 32   < > 37* 38* 38* 37*  GLUCOSE 122*   < > 128* 110* 115* 98  BUN 14   < > 21 17 16 17   CREATININE 0.73   < > 0.73 0.86 0.77 0.90  CALCIUM 9.1   < > 8.8* 9.4 9.2 9.2  MG 2.1  --  2.0  --   --   --   GFRNONAA >60   < > >60 >60 >60 >60  ANIONGAP 5   < > 5 6 6 5    < > = values in this interval not displayed.     Lipids No results for input(s): "CHOL", "TRIG", "HDL", "LABVLDL", "LDLCALC", "CHOLHDL" in the last 168 hours.  Hematology Recent Labs  Lab 12/05/21 0614 12/06/21 0514 12/09/21 0628  WBC 6.3 5.3 5.7  RBC 4.31 4.25 4.52  HGB 14.7 14.8 15.9*  HCT 45.3 45.3 47.5*  MCV 105.1* 106.6* 105.1*  MCH 34.1* 34.8* 35.2*  MCHC 32.5 32.7 33.5  RDW 12.9 12.8 12.9  PLT 112* 101* 123*    Thyroid  Recent Labs  Lab 12/02/21 1215  TSH 1.918     BNP Recent Labs  Lab 12/02/21 1215  BNP 367.6*     DDimer No results for input(s): "DDIMER" in the last 168 hours.   Radiology    No results found.  Cardiac Studies   TTE 12/03/2021 1. Left ventricular ejection fraction, by estimation, is 55 to 60%. The  left ventricle has normal function. Left ventricular endocardial border  not optimally defined to evaluate regional wall motion. There is mild left  ventricular hypertrophy. Left  ventricular diastolic parameters are indeterminate.   2. Right ventricular systolic function is mildly reduced. The right  ventricular size is moderately  enlarged. There is severely elevated  pulmonary artery systolic pressure.   3. Left atrial size was moderately dilated.   4. Right atrial size was moderately dilated.   5. The mitral valve is degenerative. Trivial mitral valve regurgitation.  Moderate mitral annular calcification.   Patient Profile     81 y.o. female with history of HFpEF, permanent A-fib, non-small cell lung cancer, COPD on oxygen, AAA s/p endovascular repair/saccular aneurysm involving the descending thoracic aorta, and HTN presenting with worsening shortness of breath, being seen for HFpEF.  Assessment & Plan    1.  Acute on chronic combined systolic and diastolic CHF: -Edema better, creatinine normal -Bicarb slowly increasing, though stable -Close to dry weight -Continue torsemide 40 mg twice daily -Metoprolol  -Relative hypotension has precluded escalation of GDMT -In follow-up, if able, would look to add either low-dose ACE inhibitor/ARB/or Entresto followed by continued escalation of evidence-based medical therapy with possible addition of MRA and SGLT2i   2.  Permanent atrial fibrillation: -Heart rate controlled -CHADS2VASc 6 (CHF, HTN, age x 2, vascular disease, sex category) -Continue Lopressor 12.5 mg twice daily, digoxin, Eliquis -Digoxin level 0.7    3.  Acute on chronic hypoxic respiratory failure/COPD: -Management as per medicine team       Signed, Christell Faith, PA-C  12/09/2021, 8:23 AM

## 2021-12-09 NOTE — Progress Notes (Signed)
Physical Therapy Treatment Patient Details Name: [name] MRN: [id number] DOB: Nov 12, 1940 Today's Date: 12/09/2021   History of Present Illness [name] is an 17yoF who comes to [facility] on 12/02/21 after worse repiratory status at pulmonology apointment.  PMH: DVT, COPD, HTN, HDL, hypothyroidism, anxiety, AAA, chronic hypoxic arterial failure typically on 4 L nasal cannula. Pt admitted in acute CHF exacerbation adn ARF c hypoxia. Pt resides at [facility].    PT Comments    Pt appears close to functional baseline. She feels comfortable returning to her ALF to receive HHPT services in order to regain strength and return to her independent PLOF.  Will continue PT until d/c from acute care.    Recommendations for follow up therapy are one component of a multi-disciplinary discharge planning process, led by the attending physician.  Recommendations may be updated based on patient status, additional functional criteria and insurance authorization.  Follow Up Recommendations  Home health PT     Assistance Recommended at Discharge Intermittent Supervision/Assistance  Patient can return home with the following A little help with walking and/or transfers;A little help with bathing/dressing/bathroom;Assist for transportation;Assistance with cooking/housework   Equipment Recommendations  None recommended by PT    Recommendations for Other Services       Precautions / Restrictions Precautions Precautions: Fall Restrictions Weight Bearing Restrictions: No     Mobility  Bed Mobility Overal bed mobility: Independent                  Transfers Overall transfer level: Needs assistance Equipment used: Rolling walker (2 wheels) Transfers: Sit to/from Stand, Bed to chair/wheelchair/BSC Sit to Stand: Supervision           General transfer comment: Able to standupright and safely transfer to bedside chair without LOB    Ambulation/Gait               General Gait  Details: Appears safe with short distance mobility   Stairs             Wheelchair Mobility    Modified Rankin (Stroke Patients Only)       Balance                                            Cognition Arousal/Alertness: Awake/alert Behavior During Therapy: WFL for tasks assessed/performed Overall Cognitive Status: Within Functional Limits for tasks assessed                                 General Comments:  (Very pleasant, aware of deficits)        Exercises General Exercises - Lower Extremity Ankle Circles/Pumps: AROM, Both, 20 reps Long Arc Quad: AROM, Both, 10 reps Hip ABduction/ADduction: AROM, Both, 10 reps Hip Flexion/Marching: AROM, Both, 10 reps    General Comments        Pertinent Vitals/Pain Pain Assessment Pain Assessment: No/denies pain    Home Living                          Prior Function            PT Goals (current goals can now be found in the care plan section) Acute Rehab PT Goals Patient Stated Goal: regain baseline respiratory status    Frequency  Min 2X/week      PT Plan Current plan remains appropriate    Co-evaluation              AM-PAC PT "6 Clicks" Mobility   Outcome Measure  Help needed turning from your back to your side while in a flat bed without using bedrails?: None Help needed moving from lying on your back to sitting on the side of a flat bed without using bedrails?: None Help needed moving to and from a bed to a chair (including a wheelchair)?: A Little Help needed standing up from a chair using your arms (e.g., wheelchair or bedside chair)?: A Little Help needed to walk in hospital room?: A Little Help needed climbing 3-5 steps with a railing? : A Lot 6 Click Score: 19    End of Session Equipment Utilized During Treatment: Oxygen (4L HFNC, no SOB noted) Activity Tolerance: Patient tolerated treatment well;No increased pain;Treatment limited  secondary to medical complications (Comment) Patient left: in chair;with call bell/phone within reach;with chair alarm set Nurse Communication: Mobility status PT Visit Diagnosis: Other abnormalities of gait and mobility (R26.89);Difficulty in walking, not elsewhere classified (R26.2);Muscle weakness (generalized) (M62.81)     Time: 6606-3016 PT Time Calculation (min) (ACUTE ONLY): 43 min  Charges:  $Gait Training: 8-22 mins $Therapeutic Exercise: 8-22 mins $Therapeutic Activity: 8-22 mins                     {Lashawna Poche, PTA   Josie Dixon 12/09/2021, 2:05 PM

## 2021-12-09 NOTE — Care Management Important Message (Signed)
Important Message  Patient Details  Name: [name] MRN: [id number] Date of Birth: [birth year]   Medicare Important Message Given:  Yes     [name] 12/09/2021, 3:09 PM

## 2021-12-10 ENCOUNTER — Telehealth: Payer: Self-pay

## 2021-12-10 NOTE — Telephone Encounter (Signed)
Transition Care Management Follow-up Telephone Call Date of discharge and from where: [facility] 12-09-21 Dx: acute on chronic combined systolic and diastolic CHF  How have you been since you were released from the hospital? Doing ok  Any questions or concerns? No  Items Reviewed: Did the pt receive and understand the discharge instructions provided? Yes  Medications obtained and verified? Yes  Other? No  Any new allergies since your discharge? No  Dietary orders reviewed? Yes Do you have support at home? Yes   Home Care and Equipment/Supplies: Were home health services ordered? Yes PT/OT If so, what is the name of the agency? [facility]   Has the agency set up a time to come to the patient's home? no Were any new equipment or medical supplies ordered?  No What is the name of the medical supply agency? na Were you able to get the supplies/equipment? not applicable Do you have any questions related to the use of the equipment or supplies? No  Functional Questionnaire: (I = Independent and D = Dependent) ADLs: D  Bathing/Dressing- D  Meal Prep- D  Eating- I  Maintaining continence- I  Transferring/Ambulation- I-WALKER  Managing Meds- D  Follow up appointments reviewed:  PCP Hospital f/u appt confirmed? Yes  Scheduled to see Dr Lorelei Pont on 12-18-21 @ 240pm. [facility] f/u appt confirmed? Yes  Scheduled to see Dr Lucky Cowboy on 12-17-21 @ 145pm- and Dr Jackelyn Hoehn 12-25-21 at 1pm. Are transportation arrangements needed? No  If their condition worsens, is the pt aware to call PCP or go to the Emergency Dept.? Yes Was the patient provided with contact information for the PCP's office or ED? Yes Was to pt encouraged to call back with questions or concerns? Yes

## 2021-12-11 ENCOUNTER — Telehealth: Payer: Self-pay | Admitting: Family Medicine

## 2021-12-11 NOTE — Telephone Encounter (Signed)
Spoke with [place] at [facility] [employer].  She is asking for delay in start on care for PT and OT with start of care being 12/12/21.  Verbal okay given to delay start of care until 12/12/21.

## 2021-12-11 NOTE — Telephone Encounter (Signed)
Home Health verbal orders Caller Name: [place]a Name: [place] number: [phone]  Requesting OT and PT  Reason: Start of care for tomorrow the 22nd  Frequency: Evaluation  Please forward to [facility] pool or providers CMA

## 2021-12-12 DIAGNOSIS — I4821 Permanent atrial fibrillation: Secondary | ICD-10-CM | POA: Diagnosis not present

## 2021-12-12 DIAGNOSIS — I110 Hypertensive heart disease with heart failure: Secondary | ICD-10-CM | POA: Diagnosis not present

## 2021-12-12 DIAGNOSIS — F419 Anxiety disorder, unspecified: Secondary | ICD-10-CM | POA: Diagnosis not present

## 2021-12-12 DIAGNOSIS — E039 Hypothyroidism, unspecified: Secondary | ICD-10-CM | POA: Diagnosis not present

## 2021-12-12 DIAGNOSIS — E669 Obesity, unspecified: Secondary | ICD-10-CM | POA: Diagnosis not present

## 2021-12-12 DIAGNOSIS — I714 Abdominal aortic aneurysm, without rupture, unspecified: Secondary | ICD-10-CM | POA: Diagnosis not present

## 2021-12-12 DIAGNOSIS — Z9181 History of falling: Secondary | ICD-10-CM | POA: Diagnosis not present

## 2021-12-12 DIAGNOSIS — Z6838 Body mass index (BMI) 38.0-38.9, adult: Secondary | ICD-10-CM | POA: Diagnosis not present

## 2021-12-12 DIAGNOSIS — I5043 Acute on chronic combined systolic (congestive) and diastolic (congestive) heart failure: Secondary | ICD-10-CM | POA: Diagnosis not present

## 2021-12-12 DIAGNOSIS — I739 Peripheral vascular disease, unspecified: Secondary | ICD-10-CM | POA: Diagnosis not present

## 2021-12-12 DIAGNOSIS — I4892 Unspecified atrial flutter: Secondary | ICD-10-CM | POA: Diagnosis not present

## 2021-12-12 DIAGNOSIS — J9621 Acute and chronic respiratory failure with hypoxia: Secondary | ICD-10-CM | POA: Diagnosis not present

## 2021-12-12 DIAGNOSIS — Z87891 Personal history of nicotine dependence: Secondary | ICD-10-CM | POA: Diagnosis not present

## 2021-12-12 DIAGNOSIS — J449 Chronic obstructive pulmonary disease, unspecified: Secondary | ICD-10-CM | POA: Diagnosis not present

## 2021-12-12 DIAGNOSIS — F32A Depression, unspecified: Secondary | ICD-10-CM | POA: Diagnosis not present

## 2021-12-15 NOTE — Telephone Encounter (Signed)
Agree with delay in start

## 2021-12-16 ENCOUNTER — Telehealth: Payer: Self-pay

## 2021-12-16 ENCOUNTER — Telehealth: Payer: Self-pay | Admitting: Family Medicine

## 2021-12-16 DIAGNOSIS — M19012 Primary osteoarthritis, left shoulder: Secondary | ICD-10-CM | POA: Diagnosis not present

## 2021-12-16 NOTE — Telephone Encounter (Signed)
Verbal orders given to [facility] for OT/PT 1 x week for 4 weeks.

## 2021-12-16 NOTE — Telephone Encounter (Signed)
Bonnie Y., can you make sure that this patient sees Dr. Ermalene Searing for her hospital follow-up: she has an appointment to see me 6/28.    This is a TCM hospital follow-up.  I have never met her, and she needs to have f/u with her PCP - complex hosp. Discharge.  [name], please help, so our patients have hospital follow-up appointments with their primary care doctors.  Thanks to all.

## 2021-12-17 ENCOUNTER — Encounter (INDEPENDENT_AMBULATORY_CARE_PROVIDER_SITE_OTHER): Payer: Medicare Other | Admitting: Vascular Surgery

## 2021-12-17 ENCOUNTER — Telehealth: Payer: Self-pay

## 2021-12-17 DIAGNOSIS — J449 Chronic obstructive pulmonary disease, unspecified: Secondary | ICD-10-CM | POA: Diagnosis not present

## 2021-12-17 DIAGNOSIS — I4821 Permanent atrial fibrillation: Secondary | ICD-10-CM | POA: Diagnosis not present

## 2021-12-17 DIAGNOSIS — I110 Hypertensive heart disease with heart failure: Secondary | ICD-10-CM | POA: Diagnosis not present

## 2021-12-17 DIAGNOSIS — I4892 Unspecified atrial flutter: Secondary | ICD-10-CM | POA: Diagnosis not present

## 2021-12-17 DIAGNOSIS — J9621 Acute and chronic respiratory failure with hypoxia: Secondary | ICD-10-CM | POA: Diagnosis not present

## 2021-12-17 DIAGNOSIS — I5043 Acute on chronic combined systolic (congestive) and diastolic (congestive) heart failure: Secondary | ICD-10-CM | POA: Diagnosis not present

## 2021-12-18 ENCOUNTER — Ambulatory Visit: Payer: Medicare Other | Admitting: Family Medicine

## 2021-12-19 DIAGNOSIS — J449 Chronic obstructive pulmonary disease, unspecified: Secondary | ICD-10-CM | POA: Diagnosis not present

## 2021-12-19 DIAGNOSIS — J9621 Acute and chronic respiratory failure with hypoxia: Secondary | ICD-10-CM | POA: Diagnosis not present

## 2021-12-19 DIAGNOSIS — I5043 Acute on chronic combined systolic (congestive) and diastolic (congestive) heart failure: Secondary | ICD-10-CM | POA: Diagnosis not present

## 2021-12-19 DIAGNOSIS — I110 Hypertensive heart disease with heart failure: Secondary | ICD-10-CM | POA: Diagnosis not present

## 2021-12-19 DIAGNOSIS — I4892 Unspecified atrial flutter: Secondary | ICD-10-CM | POA: Diagnosis not present

## 2021-12-19 DIAGNOSIS — I4821 Permanent atrial fibrillation: Secondary | ICD-10-CM | POA: Diagnosis not present

## 2021-12-20 ENCOUNTER — Ambulatory Visit (INDEPENDENT_AMBULATORY_CARE_PROVIDER_SITE_OTHER): Payer: Medicare Other | Admitting: Family Medicine

## 2021-12-20 DIAGNOSIS — I4892 Unspecified atrial flutter: Secondary | ICD-10-CM | POA: Diagnosis not present

## 2021-12-20 DIAGNOSIS — I5043 Acute on chronic combined systolic (congestive) and diastolic (congestive) heart failure: Secondary | ICD-10-CM | POA: Diagnosis not present

## 2021-12-20 DIAGNOSIS — J9621 Acute and chronic respiratory failure with hypoxia: Secondary | ICD-10-CM | POA: Diagnosis not present

## 2021-12-20 DIAGNOSIS — J449 Chronic obstructive pulmonary disease, unspecified: Secondary | ICD-10-CM | POA: Diagnosis not present

## 2021-12-20 DIAGNOSIS — I4821 Permanent atrial fibrillation: Secondary | ICD-10-CM | POA: Diagnosis not present

## 2021-12-20 DIAGNOSIS — I110 Hypertensive heart disease with heart failure: Secondary | ICD-10-CM | POA: Diagnosis not present

## 2021-12-20 DIAGNOSIS — R413 Other amnesia: Secondary | ICD-10-CM

## 2021-12-23 ENCOUNTER — Encounter: Payer: Self-pay | Admitting: Family Medicine

## 2021-12-23 ENCOUNTER — Ambulatory Visit (INDEPENDENT_AMBULATORY_CARE_PROVIDER_SITE_OTHER): Payer: Medicare Other | Admitting: Family Medicine

## 2021-12-23 VITALS — BP 110/68 | HR 107 | Temp 98.3°F | Ht 60.0 in | Wt 192.4 lb

## 2021-12-23 DIAGNOSIS — N939 Abnormal uterine and vaginal bleeding, unspecified: Secondary | ICD-10-CM | POA: Diagnosis not present

## 2021-12-23 DIAGNOSIS — I10 Essential (primary) hypertension: Secondary | ICD-10-CM | POA: Diagnosis not present

## 2021-12-23 DIAGNOSIS — I5022 Chronic systolic (congestive) heart failure: Secondary | ICD-10-CM

## 2021-12-23 DIAGNOSIS — J439 Emphysema, unspecified: Secondary | ICD-10-CM | POA: Diagnosis not present

## 2021-12-23 DIAGNOSIS — I5033 Acute on chronic diastolic (congestive) heart failure: Secondary | ICD-10-CM

## 2021-12-23 DIAGNOSIS — R0902 Hypoxemia: Secondary | ICD-10-CM | POA: Diagnosis not present

## 2021-12-23 DIAGNOSIS — J9621 Acute and chronic respiratory failure with hypoxia: Secondary | ICD-10-CM | POA: Diagnosis not present

## 2021-12-23 DIAGNOSIS — J9611 Chronic respiratory failure with hypoxia: Secondary | ICD-10-CM | POA: Diagnosis not present

## 2021-12-23 DIAGNOSIS — I4821 Permanent atrial fibrillation: Secondary | ICD-10-CM | POA: Diagnosis not present

## 2021-12-23 LAB — BASIC METABOLIC PANEL
BUN: 18 mg/dL (ref 6–23)
CO2: 34 mEq/L — ABNORMAL HIGH (ref 19–32)
Calcium: 9.5 mg/dL (ref 8.4–10.5)
Chloride: 96 mEq/L (ref 96–112)
Creatinine, Ser: 1.02 mg/dL (ref 0.40–1.20)
GFR: 51.07 mL/min — ABNORMAL LOW (ref >60.00)
Glucose, Bld: 101 mg/dL — ABNORMAL HIGH (ref 70–99)
Potassium: 3.8 mEq/L (ref 3.5–5.1)
Sodium: 139 mEq/L (ref 135–145)

## 2021-12-23 LAB — CBC WITH DIFFERENTIAL/PLATELET
Basophils Absolute: 0 10*3/uL (ref 0.0–0.1)
Basophils Relative: 0.4 % (ref 0.0–3.0)
Eosinophils Absolute: 0.1 10*3/uL (ref 0.0–0.7)
Eosinophils Relative: 1 % (ref 0.0–5.0)
HCT: 50 % — ABNORMAL HIGH (ref 36.0–46.0)
Hemoglobin: 16.5 g/dL — ABNORMAL HIGH (ref 12.0–15.0)
Lymphocytes Relative: 19.5 % (ref 12.0–46.0)
Lymphs Abs: 1.4 10*3/uL (ref 0.7–4.0)
MCHC: 33 g/dL (ref 30.0–36.0)
MCV: 106.5 fl — ABNORMAL HIGH (ref 78.0–100.0)
Monocytes Absolute: 0.6 10*3/uL (ref 0.1–1.0)
Monocytes Relative: 8.1 % (ref 3.0–12.0)
Neutro Abs: 5.1 10*3/uL (ref 1.4–7.7)
Neutrophils Relative %: 71 % (ref 43.0–77.0)
Platelets: 137 10*3/uL — ABNORMAL LOW (ref 150.0–400.0)
RBC: 4.7 Mil/uL (ref 3.87–5.11)
RDW: 13.7 % (ref 11.5–15.5)
WBC: 7.2 10*3/uL (ref 4.0–10.5)

## 2021-12-23 LAB — MAGNESIUM: Magnesium: 2 mg/dL (ref 1.5–2.5)

## 2021-12-23 NOTE — Assessment & Plan Note (Addendum)
Chronic.. has seen off and on in the psat... has noted again in last week. She may not be taking Megace.  She will contact Dr. Denman George for further recommendations and will restart Megace if not taking.

## 2021-12-23 NOTE — Progress Notes (Signed)
Patient ID: [name], female    DOB: 11-Jun-1941, 81 y.o.   MRN: [id number]  This visit was conducted in person.  Ht 5' (1.524 m)   Wt 192 lb 6 oz (87.3 kg)   BMI 37.57 kg/m   Blood pressure 110/68, pulse (!) 107, temperature 98.3 F (36.8 C), temperature source Oral, height 5' (1.524 m), weight 192 lb 6 oz (87.3 kg), SpO2 93 %.   CC:  Chief Complaint  Patient presents with   Hospitalization Follow-up    Subjective:   HPI: [name] is a 81 y.o. female with history for severe COPD with chronic respiratory failure on 4 L of oxygen, history of atrial fibrillation on anticoagulation therapy, history of chronic low back pain, anxiety and depression and history of combined systolic and diastolic dysfunction.  Presenting on 12/23/2021 for Hospitalization Follow-up  Recent hospital admission  12/02/2021 to 12/09/2021 He was sent to the emergency room for evaluation from her pulmonary office due to increased oxygen requirement.  She was found to be tachycardic with rapid atrial flutter flutter on admission as well as bilateral pleural effusions and elevated BNP. 6/13: Cardiology and palliative care consulted 6/14: remains on 6 L/min HFNC O2, IV >> PO diuresis 6/15: O2 needs up to 8 L this AM, CXR no infiltrate, back to IV diuressis 6/16-18: Further IV diuresis until euvolemic   Treated IV Lasix, was transitioned to oral torsemide 40 mg BID 6/14, but resumed on IV Lasix BID 6/15 due to persistent signs of volume overload and increased oxygen requirement --IV Lasix increased to TID (thru 6/18)  At discharge her oxygen  requirement had returned to normal following diuresis.  She had stable baseline shortness of breath and edema and had improved.  At discharge she was told to continue torsemide 40 mg twice daily.  She was told to continue carvedilol but hold it if her blood pressure dropped.  She has not been treated with an ACE or an ARB due to relative hypotension  Echo during  admission showed an ejection fraction of 55 to 60%.  He has had home health PT and OT come once to her house for evaluation.  She states that she has been at her baseline health since she has been at home.   Today Here at our office after being brought back from the waiting room it was discovered that her oxygen had not been on, she had been wearing the nasal cannula but had forgotten to turn it on..  After walking back to the room she was SOB, dizzy and her oxygen saturation was down to 82%.  Once oxygen was started back at her usual 4 L. Over 10 minutes her oxygen returned to 93%  Palliative care was consulted during this hospitalization.  We discussed this during the office visit today in detail as far as goals of care.  She reports she has been at her baseline SOB level and peripheral edema has ben stable. Wt Readings from Last 3 Encounters:  12/23/21 192 lb 6 oz (87.3 kg)  12/09/21 195 lb 4.8 oz (88.6 kg)  12/02/21 190 lb (86.2 kg)    She has noted vaginal bleeding.. smear on pad in last week off and on.. has ahad in past.  Evaluated by Dr. Denman George.  Reviewed note 01/09/2021 recurrent postmenopausal bleeding with previously benign sampling. She is not a candidate for hysterectomy due to medical comorbidities and very poor general conditioning/performance status.  The degree of bleeding is very  light (not dangerous, not requiring transfusion, but does present a nuisance as she needs to purchase poise pads).     Last  endometrial bx 2022 negative for malignancy.  Relevant past medical, surgical, family and social history reviewed and updated as indicated. Interim medical history since our last visit reviewed. Allergies and medications reviewed and updated. Outpatient Medications Prior to Visit  Medication Sig Dispense Refill   acetaminophen (TYLENOL) 500 MG tablet Take 2 tablets (1,000 mg total) by mouth every 6 (six) hours as needed for fever, headache, mild pain or moderate pain  (backpain). 30 tablet 0   albuterol (VENTOLIN HFA) 108 (90 Base) MCG/ACT inhaler TAKE 2 PUFFS EVERY 6 HOURS AS NEEDED FORSHORTNESS OF BREATH/WHEEZING 54 g 4   apixaban (ELIQUIS) 5 MG TABS tablet Take 1 tablet (5 mg total) by mouth 2 (two) times daily. 60 tablet    Cholecalciferol (VITAMIN D) 1000 UNITS capsule Take 1,000 Units by mouth daily.     citalopram (CELEXA) 20 MG tablet Take 20 mg by mouth daily.     digoxin (LANOXIN) 0.125 MG tablet Take 0.5 tablets (0.0625 mg total) by mouth daily. 90 tablet 3   feeding supplement (ENSURE ENLIVE / ENSURE PLUS) LIQD Take 237 mLs by mouth 2 (two) times daily between meals. 237 mL 12   gabapentin (NEURONTIN) 100 MG capsule Take 1 capsule by mouth 3 (three) times daily.     ipratropium-albuterol (DUONEB) 0.5-2.5 (3) MG/3ML SOLN Take 3 mLs by nebulization 2 (two) times daily.     levalbuterol (XOPENEX HFA) 45 MCG/ACT inhaler Inhale 2 puffs into the lungs every 8 (eight) hours as needed.     levothyroxine (SYNTHROID, LEVOTHROID) 88 MCG tablet Take 88 mcg by mouth daily.     melatonin 5 MG TABS Take 5 mg by mouth at bedtime.     metoprolol tartrate (LOPRESSOR) 25 MG tablet TAKE 1/2 TABLET TWICE DAILY 90 tablet 3   morphine (MSIR) 15 MG tablet Take 0.5 tablets (7.5 mg total) by mouth every 6 (six) hours as needed. 10 tablet 0   Multiple Vitamin (MULTIVITAMIN WITH MINERALS) TABS tablet Take 1 tablet by mouth daily.     OXYGEN Inhale 4 L into the lungs daily. Patient states she wears 2L at night and 4L when up and moving     pantoprazole (PROTONIX) 40 MG tablet Take 1 tablet (40 mg total) by mouth daily. 30 tablet 0   polyethylene glycol (MIRALAX / GLYCOLAX) 17 g packet Take 17 g by mouth daily as needed. 14 each 0   potassium chloride SA (KLOR-CON M) 20 MEQ tablet Take 20 mEq by mouth 2 (two) times daily.     tiotropium (SPIRIVA HANDIHALER) 18 MCG inhalation capsule Place 1 capsule (18 mcg total) into inhaler and inhale daily. 90 capsule 4   torsemide (DEMADEX)  20 MG tablet Take 40 mg by mouth 2 (two) times daily.     torsemide 40 MG TABS Take 40 mg by mouth 2 (two) times daily. 60 tablet 1   vitamin B-12 (CYANOCOBALAMIN) 100 MCG tablet Take 100 mcg by mouth daily.     No facility-administered medications prior to visit.     Per HPI unless specifically indicated in ROS section below Review of Systems  Constitutional:  Positive for fatigue. Negative for fever.  HENT:  Negative for congestion.   Eyes:  Negative for pain.  Respiratory:  Negative for cough and shortness of breath.   Cardiovascular:  Negative for chest pain, palpitations and leg swelling.  Gastrointestinal:  Negative for abdominal pain.  Genitourinary:  Negative for dysuria and vaginal bleeding.  Musculoskeletal:  Negative for back pain.  Neurological:  Negative for syncope, light-headedness and headaches.  Psychiatric/Behavioral:  Negative for dysphoric mood.    Objective:  Ht 5' (1.524 m)   Wt 192 lb 6 oz (87.3 kg)   BMI 37.57 kg/m   Wt Readings from Last 3 Encounters:  12/23/21 192 lb 6 oz (87.3 kg)  12/09/21 195 lb 4.8 oz (88.6 kg)  12/02/21 190 lb (86.2 kg)      Physical Exam Constitutional:      General: She is not in acute distress.    Appearance: Normal appearance. She is well-developed. She is not ill-appearing or toxic-appearing.  HENT:     Head: Normocephalic.     Right Ear: Hearing, tympanic membrane, ear canal and external ear normal. Tympanic membrane is not erythematous, retracted or bulging.     Left Ear: Hearing, tympanic membrane, ear canal and external ear normal. Tympanic membrane is not erythematous, retracted or bulging.     Nose: No mucosal edema or rhinorrhea.     Right Sinus: No maxillary sinus tenderness or frontal sinus tenderness.     Left Sinus: No maxillary sinus tenderness or frontal sinus tenderness.     Mouth/Throat:     Pharynx: Uvula midline.  Eyes:     General: Lids are normal. Lids are everted, no foreign bodies appreciated.      Conjunctiva/sclera: Conjunctivae normal.     Pupils: Pupils are equal, round, and reactive to light.  Neck:     Thyroid: No thyroid mass or thyromegaly.     Vascular: No carotid bruit.     Trachea: Trachea normal.  Cardiovascular:     Rate and Rhythm: Normal rate and regular rhythm.     Pulses: Normal pulses.     Heart sounds: Normal heart sounds, S1 normal and S2 normal. No murmur heard.    No friction rub. No gallop.     Comments: Chronic venous stasis changes Pulmonary:     Effort: Pulmonary effort is normal. No tachypnea or respiratory distress.     Breath sounds: Normal breath sounds. No decreased breath sounds, wheezing, rhonchi or rales.  Abdominal:     General: Bowel sounds are normal.     Palpations: Abdomen is soft.     Tenderness: There is no abdominal tenderness.  Musculoskeletal:     Cervical back: Normal range of motion and neck supple.     Right lower leg: 1+ Edema present.     Left lower leg: 1+ Edema present.  Skin:    General: Skin is warm and dry.     Findings: No rash.  Neurological:     Mental Status: She is alert.  Psychiatric:        Mood and Affect: Mood is not anxious or depressed.        Speech: Speech normal.        Behavior: Behavior normal. Behavior is cooperative.        Thought Content: Thought content normal.        Judgment: Judgment normal.       Results for orders placed or performed during the hospital encounter of 12/02/21  SARS Coronavirus 2 by RT PCR (hospital order, performed in [facility] hospital lab) *cepheid single result test* Anterior Nasal Swab   Specimen: Anterior Nasal Swab  Result Value Ref Range   SARS Coronavirus 2 by RT PCR NEGATIVE NEGATIVE  Digoxin level  Result Value Ref Range   Digoxin Level 0.7 (L) 0.8 - 2.0 ng/mL  TSH  Result Value Ref Range   TSH 1.918 0.350 - 4.500 uIU/mL  CBC with Differential  Result Value Ref Range   WBC 5.4 4.0 - 10.5 K/uL   RBC 4.55 3.87 - 5.11 MIL/uL   Hemoglobin 15.8 (H) 12.0 -  15.0 g/dL   HCT 48.5 (H) 36.0 - 46.0 %   MCV 106.6 (H) 80.0 - 100.0 fL   MCH 34.7 (H) 26.0 - 34.0 pg   MCHC 32.6 30.0 - 36.0 g/dL   RDW 13.2 11.5 - 15.5 %   Platelets 105 (L) 150 - 400 K/uL   nRBC 0.0 0.0 - 0.2 %   Neutrophils Relative % 73 %   Neutro Abs 4.0 1.7 - 7.7 K/uL   Lymphocytes Relative 19 %   Lymphs Abs 1.0 0.7 - 4.0 K/uL   Monocytes Relative 7 %   Monocytes Absolute 0.4 0.1 - 1.0 K/uL   Eosinophils Relative 1 %   Eosinophils Absolute 0.0 0.0 - 0.5 K/uL   Basophils Relative 0 %   Basophils Absolute 0.0 0.0 - 0.1 K/uL   Immature Granulocytes 0 %   Abs Immature Granulocytes 0.01 0.00 - 0.07 K/uL  Basic metabolic panel  Result Value Ref Range   Sodium 139 135 - 145 mmol/L   Potassium 4.2 3.5 - 5.1 mmol/L   Chloride 102 98 - 111 mmol/L   CO2 32 22 - 32 mmol/L   Glucose, Bld 122 (H) 70 - 99 mg/dL   BUN 14 8 - 23 mg/dL   Creatinine, Ser 0.73 0.44 - 1.00 mg/dL   Calcium 9.1 8.9 - 10.3 mg/dL   GFR, Estimated >60 >60 mL/min   Anion gap 5 5 - 15  Magnesium  Result Value Ref Range   Magnesium 2.1 1.7 - 2.4 mg/dL  Brain natriuretic peptide  Result Value Ref Range   B Natriuretic Peptide 367.6 (H) 0.0 - 100.0 pg/mL  Procalcitonin - Baseline  Result Value Ref Range   Procalcitonin <0.10 ng/mL  Basic metabolic panel  Result Value Ref Range   Sodium 138 135 - 145 mmol/L   Potassium 4.1 3.5 - 5.1 mmol/L   Chloride 102 98 - 111 mmol/L   CO2 32 22 - 32 mmol/L   Glucose, Bld 104 (H) 70 - 99 mg/dL   BUN 16 8 - 23 mg/dL   Creatinine, Ser 0.71 0.44 - 1.00 mg/dL   Calcium 9.0 8.9 - 10.3 mg/dL   GFR, Estimated >60 >60 mL/min   Anion gap 4 (L) 5 - 15  CBC  Result Value Ref Range   WBC 4.7 4.0 - 10.5 K/uL   RBC 4.29 3.87 - 5.11 MIL/uL   Hemoglobin 14.7 12.0 - 15.0 g/dL   HCT 44.9 36.0 - 46.0 %   MCV 104.7 (H) 80.0 - 100.0 fL   MCH 34.3 (H) 26.0 - 34.0 pg   MCHC 32.7 30.0 - 36.0 g/dL   RDW 13.1 11.5 - 15.5 %   Platelets 97 (L) 150 - 400 K/uL   nRBC 0.0 0.0 - 0.2 %  CBC   Result Value Ref Range   WBC 5.1 4.0 - 10.5 K/uL   RBC 4.24 3.87 - 5.11 MIL/uL   Hemoglobin 14.8 12.0 - 15.0 g/dL   HCT 45.2 36.0 - 46.0 %   MCV 106.6 (H) 80.0 - 100.0 fL   MCH 34.9 (H) 26.0 - 34.0 pg  MCHC 32.7 30.0 - 36.0 g/dL   RDW 13.1 11.5 - 15.5 %   Platelets 95 (L) 150 - 400 K/uL   nRBC 0.0 0.0 - 0.2 %  Basic metabolic panel  Result Value Ref Range   Sodium 138 135 - 145 mmol/L   Potassium 4.0 3.5 - 5.1 mmol/L   Chloride 100 98 - 111 mmol/L   CO2 33 (H) 22 - 32 mmol/L   Glucose, Bld 99 70 - 99 mg/dL   BUN 18 8 - 23 mg/dL   Creatinine, Ser 0.71 0.44 - 1.00 mg/dL   Calcium 9.2 8.9 - 10.3 mg/dL   GFR, Estimated >60 >60 mL/min   Anion gap 5 5 - 15  Basic metabolic panel  Result Value Ref Range   Sodium 137 135 - 145 mmol/L   Potassium 4.0 3.5 - 5.1 mmol/L   Chloride 97 (L) 98 - 111 mmol/L   CO2 36 (H) 22 - 32 mmol/L   Glucose, Bld 114 (H) 70 - 99 mg/dL   BUN 21 8 - 23 mg/dL   Creatinine, Ser 0.76 0.44 - 1.00 mg/dL   Calcium 9.1 8.9 - 10.3 mg/dL   GFR, Estimated >60 >60 mL/min   Anion gap 4 (L) 5 - 15  CBC  Result Value Ref Range   WBC 6.3 4.0 - 10.5 K/uL   RBC 4.31 3.87 - 5.11 MIL/uL   Hemoglobin 14.7 12.0 - 15.0 g/dL   HCT 45.3 36.0 - 46.0 %   MCV 105.1 (H) 80.0 - 100.0 fL   MCH 34.1 (H) 26.0 - 34.0 pg   MCHC 32.5 30.0 - 36.0 g/dL   RDW 12.9 11.5 - 15.5 %   Platelets 112 (L) 150 - 400 K/uL   nRBC 0.0 0.0 - 0.2 %  Blood gas, venous  Result Value Ref Range   pH, Ven 7.42 7.25 - 7.43   pCO2, Ven 64 (H) 44 - 60 mmHg   pO2, Ven 131 (H) 32 - 45 mmHg   Bicarbonate 41.5 (H) 20.0 - 28.0 mmol/L   Acid-Base Excess 14.0 (H) 0.0 - 2.0 mmol/L   O2 Saturation 99.7 %   Patient temperature 37.0    Collection site VEIN    Drawn by VENOUS   Basic metabolic panel  Result Value Ref Range   Sodium 140 135 - 145 mmol/L   Potassium 4.0 3.5 - 5.1 mmol/L   Chloride 98 98 - 111 mmol/L   CO2 37 (H) 22 - 32 mmol/L   Glucose, Bld 128 (H) 70 - 99 mg/dL   BUN 21 8 - 23 mg/dL    Creatinine, Ser 0.73 0.44 - 1.00 mg/dL   Calcium 8.8 (L) 8.9 - 10.3 mg/dL   GFR, Estimated >60 >60 mL/min   Anion gap 5 5 - 15  CBC  Result Value Ref Range   WBC 5.3 4.0 - 10.5 K/uL   RBC 4.25 3.87 - 5.11 MIL/uL   Hemoglobin 14.8 12.0 - 15.0 g/dL   HCT 45.3 36.0 - 46.0 %   MCV 106.6 (H) 80.0 - 100.0 fL   MCH 34.8 (H) 26.0 - 34.0 pg   MCHC 32.7 30.0 - 36.0 g/dL   RDW 12.8 11.5 - 15.5 %   Platelets 101 (L) 150 - 400 K/uL   nRBC 0.0 0.0 - 0.2 %  Magnesium  Result Value Ref Range   Magnesium 2.0 1.7 - 2.4 mg/dL  Basic metabolic panel  Result Value Ref Range   Sodium 139 135 -  145 mmol/L   Potassium 4.2 3.5 - 5.1 mmol/L   Chloride 95 (L) 98 - 111 mmol/L   CO2 38 (H) 22 - 32 mmol/L   Glucose, Bld 110 (H) 70 - 99 mg/dL   BUN 17 8 - 23 mg/dL   Creatinine, Ser 0.86 0.44 - 1.00 mg/dL   Calcium 9.4 8.9 - 10.3 mg/dL   GFR, Estimated >60 >60 mL/min   Anion gap 6 5 - 15  Basic metabolic panel  Result Value Ref Range   Sodium 140 135 - 145 mmol/L   Potassium 3.8 3.5 - 5.1 mmol/L   Chloride 96 (L) 98 - 111 mmol/L   CO2 38 (H) 22 - 32 mmol/L   Glucose, Bld 115 (H) 70 - 99 mg/dL   BUN 16 8 - 23 mg/dL   Creatinine, Ser 0.77 0.44 - 1.00 mg/dL   Calcium 9.2 8.9 - 10.3 mg/dL   GFR, Estimated >60 >60 mL/min   Anion gap 6 5 - 15  CBC  Result Value Ref Range   WBC 5.7 4.0 - 10.5 K/uL   RBC 4.52 3.87 - 5.11 MIL/uL   Hemoglobin 15.9 (H) 12.0 - 15.0 g/dL   HCT 47.5 (H) 36.0 - 46.0 %   MCV 105.1 (H) 80.0 - 100.0 fL   MCH 35.2 (H) 26.0 - 34.0 pg   MCHC 33.5 30.0 - 36.0 g/dL   RDW 12.9 11.5 - 15.5 %   Platelets 123 (L) 150 - 400 K/uL   nRBC 0.0 0.0 - 0.2 %  Basic metabolic panel  Result Value Ref Range   Sodium 138 135 - 145 mmol/L   Potassium 4.1 3.5 - 5.1 mmol/L   Chloride 96 (L) 98 - 111 mmol/L   CO2 37 (H) 22 - 32 mmol/L   Glucose, Bld 98 70 - 99 mg/dL   BUN 17 8 - 23 mg/dL   Creatinine, Ser 0.90 0.44 - 1.00 mg/dL   Calcium 9.2 8.9 - 10.3 mg/dL   GFR, Estimated >60 >60 mL/min    Anion gap 5 5 - 15  ECHOCARDIOGRAM COMPLETE  Result Value Ref Range   Weight 3,248.7 oz   Height 60 in   BP 104/64 mmHg   Ao pk vel 1.09 m/s   AV Area VTI 2.01 cm2   AR max vel 2.08 cm2   AV Mean grad 2.0 mmHg   AV Peak grad 4.7 mmHg   S' Lateral 2.30 cm   AV Area mean vel 2.04 cm2   Area-P 1/2 4.24 cm2   MV VTI 1.77 cm2  Troponin I (High Sensitivity)  Result Value Ref Range   Troponin I (High Sensitivity) 18 (H) <18 ng/L     COVID 19 screen:  No recent travel or known exposure to COVID19 The patient denies respiratory symptoms of COVID 19 at this time. The importance of social distancing was discussed today.   Assessment and Plan    Problem List Items Addressed This Visit     Acute on chronic heart failure with preserved ejection fraction (HFpEF) (HCC) (Chronic)   Relevant Orders   Basic Metabolic Panel (Completed)   CBC with Differential/Platelet (Completed)   Magnesium (Completed)   Amb Referral to Palliative Care   Chronic respiratory failure with hypoxia (HCC) (Chronic)    Continued oxygen requirement.  We reviewed the necessity of continuous oxygen with family and patient.  No evidence of acute worsening.      Chronic systolic CHF (congestive heart failure) (HCC) (Chronic)  Euvolemic in office today.Continue torsemide 40 mg   twice daily Check daily weight.. call if weight increases > 3 lbs  in 24 hours.      COPD (chronic obstructive pulmonary disease) (HCC) - Primary (Chronic)    Improved on current treatment.      Relevant Orders   Amb Referral to Palliative Care   HTN (hypertension) (Chronic)    Stable, chronic.  Continue current medication.         Permanent atrial fibrillation (HCC) (Chronic)   Acute on chronic respiratory failure with hypoxia (HCC)   Relevant Orders   Amb Referral to Palliative Care   RESOLVED: Hypoxia   Vaginal bleeding     Chronic.. has seen off and on in the psat... has noted again in last week. She may not be  taking Megace.  She will contact Dr. Denman George for further recommendations and will restart Megace if not taking.        Eliezer Lofts, MD

## 2021-12-23 NOTE — Patient Instructions (Addendum)
Please stop at the lab to have labs drawn.  Continue torsemide 40 mg   twice daily  If not taking megace 40 mg daily... call Dr. Denman George to ask for refill for recurrent post menopausal bleeding.  Check daily weight.. call if weight increases > 3 lbs  in 24 hours.  Be aware palliative care will be calling you to set up a meeting to help complete discussion/work started in hopsital.

## 2021-12-24 NOTE — Progress Notes (Deleted)
   Patient ID: [name], female    DOB: [birth year], 81 y.o.   MRN: [id number]  HPI  [name] is a 81 y/o female with a history of  Echo report from 12/03/21 reviewed and showed an EF of 55-60% along with mild LVH, severely elevated PA pressure, moderate LAE, moderate/severe TR and trivial MR.   Admitted 12/02/21 due to increased oxygen requirement and hypotension. Initially given IV lasix with transition to oral diuretics. Weaned off high flow oxygen and back to her baseline oxygen. Cardiology and palliative care consults obtained. PT/OT evaluations done. Discharged after 7 days.   She presents today for her initial visit with a chief complaint of   Review of Systems    Physical Exam    Assessment & Plan:  1: Chronic heart failure with preserved ejection fraction with structural changes (LVH/LAE)- - NYHA class - BNP 12/02/21 was 367.6  2: HTN- - BP - saw PCP [name]) 12/23/21 - BMP 12/23/21 reviewed and showed sodium 139, potassium 3.8, creatinine 1.02 and GFR 51.07  3: Atrial fibrillation- - saw cardiology ([name]) 10/29/21  4: COPD- - saw pulmonology [name]) 12/02/21  5: Obstructive sleep apnea-  6: Chronic pain- - saw [facility] 12/16/21

## 2021-12-25 ENCOUNTER — Telehealth: Payer: Self-pay | Admitting: Family

## 2021-12-25 ENCOUNTER — Ambulatory Visit: Payer: Medicare Other | Admitting: Family

## 2021-12-25 NOTE — Telephone Encounter (Signed)
Patient did not show for her initial [facility] appointment on 12/25/21. Will attempt to reschedule.

## 2021-12-26 ENCOUNTER — Telehealth: Payer: Self-pay

## 2021-12-26 DIAGNOSIS — Z7901 Long term (current) use of anticoagulants: Secondary | ICD-10-CM | POA: Diagnosis not present

## 2021-12-26 DIAGNOSIS — J9611 Chronic respiratory failure with hypoxia: Secondary | ICD-10-CM | POA: Diagnosis not present

## 2021-12-26 DIAGNOSIS — I5042 Chronic combined systolic (congestive) and diastolic (congestive) heart failure: Secondary | ICD-10-CM | POA: Diagnosis not present

## 2021-12-26 DIAGNOSIS — I4892 Unspecified atrial flutter: Secondary | ICD-10-CM | POA: Diagnosis not present

## 2021-12-26 DIAGNOSIS — J449 Chronic obstructive pulmonary disease, unspecified: Secondary | ICD-10-CM | POA: Diagnosis not present

## 2021-12-26 DIAGNOSIS — Z9981 Dependence on supplemental oxygen: Secondary | ICD-10-CM | POA: Diagnosis not present

## 2021-12-26 NOTE — Telephone Encounter (Signed)
Returned call to patient. No answer.  Left voicemail that I would try to reach her again later this afternoon.

## 2021-12-26 NOTE — Telephone Encounter (Signed)
Lab results and Dr. Rometta Emery comments discussed with Anne Ng (daughter) via telephone.  See result note on 12/23/21 labs.

## 2021-12-26 NOTE — Telephone Encounter (Signed)
Patient returned phone call about lab results.

## 2021-12-27 DIAGNOSIS — I4892 Unspecified atrial flutter: Secondary | ICD-10-CM | POA: Diagnosis not present

## 2021-12-27 DIAGNOSIS — I4821 Permanent atrial fibrillation: Secondary | ICD-10-CM | POA: Diagnosis not present

## 2021-12-27 DIAGNOSIS — I110 Hypertensive heart disease with heart failure: Secondary | ICD-10-CM | POA: Diagnosis not present

## 2021-12-27 DIAGNOSIS — I5043 Acute on chronic combined systolic (congestive) and diastolic (congestive) heart failure: Secondary | ICD-10-CM | POA: Diagnosis not present

## 2021-12-27 DIAGNOSIS — J449 Chronic obstructive pulmonary disease, unspecified: Secondary | ICD-10-CM | POA: Diagnosis not present

## 2021-12-27 DIAGNOSIS — J9621 Acute and chronic respiratory failure with hypoxia: Secondary | ICD-10-CM | POA: Diagnosis not present

## 2021-12-29 NOTE — Assessment & Plan Note (Signed)
On continuous oxygen 

## 2021-12-29 NOTE — Assessment & Plan Note (Signed)
Stable control at last check in January on levothyroxine 88 mcg daily

## 2021-12-29 NOTE — Assessment & Plan Note (Signed)
Status post surgery, also has a thoracic aneurysm: Pending change to vascular MD in [place].

## 2021-12-29 NOTE — Assessment & Plan Note (Addendum)
Rate controlled with metoprolol and digoxin.  On Eliquis for anticoagulation.  Given a CHA2DS2-VASc of at least 6 (CHF, HTN, age x2, vascular disease, sex category), she remains on apixaban (does not meet reduced dosing criteria). Peripheral edema stable on 80 mg furosemide daily Considering addition of Entresto in future as well as possible SGLT2 inhibitor.  Has follow up in 3 months

## 2021-12-29 NOTE — Assessment & Plan Note (Signed)
Well controlled on metoprolol, furosemide and digoxin

## 2021-12-29 NOTE — Assessment & Plan Note (Signed)
On continuous oxygen.  Referral pending to change to [facility] office pulmonologist. Using Spiriva daily, DuoNebs and albuterol as needed

## 2021-12-29 NOTE — Assessment & Plan Note (Signed)
Not a surgical candidate. Steroid injections not helpful.  Plans to change to [facility] office as well.  She is not a surgical candidate it is currently treated with morphine.

## 2021-12-30 DIAGNOSIS — J449 Chronic obstructive pulmonary disease, unspecified: Secondary | ICD-10-CM | POA: Diagnosis not present

## 2021-12-30 DIAGNOSIS — I5043 Acute on chronic combined systolic (congestive) and diastolic (congestive) heart failure: Secondary | ICD-10-CM | POA: Diagnosis not present

## 2021-12-30 DIAGNOSIS — I4892 Unspecified atrial flutter: Secondary | ICD-10-CM | POA: Diagnosis not present

## 2021-12-30 DIAGNOSIS — I110 Hypertensive heart disease with heart failure: Secondary | ICD-10-CM | POA: Diagnosis not present

## 2021-12-30 DIAGNOSIS — I4821 Permanent atrial fibrillation: Secondary | ICD-10-CM | POA: Diagnosis not present

## 2021-12-30 DIAGNOSIS — J9621 Acute and chronic respiratory failure with hypoxia: Secondary | ICD-10-CM | POA: Diagnosis not present

## 2021-12-31 DIAGNOSIS — I4892 Unspecified atrial flutter: Secondary | ICD-10-CM | POA: Diagnosis not present

## 2021-12-31 DIAGNOSIS — I5043 Acute on chronic combined systolic (congestive) and diastolic (congestive) heart failure: Secondary | ICD-10-CM | POA: Diagnosis not present

## 2021-12-31 DIAGNOSIS — I110 Hypertensive heart disease with heart failure: Secondary | ICD-10-CM | POA: Diagnosis not present

## 2021-12-31 DIAGNOSIS — I4821 Permanent atrial fibrillation: Secondary | ICD-10-CM | POA: Diagnosis not present

## 2021-12-31 DIAGNOSIS — J449 Chronic obstructive pulmonary disease, unspecified: Secondary | ICD-10-CM | POA: Diagnosis not present

## 2021-12-31 DIAGNOSIS — J9621 Acute and chronic respiratory failure with hypoxia: Secondary | ICD-10-CM | POA: Diagnosis not present

## 2022-01-01 ENCOUNTER — Encounter: Payer: Self-pay | Admitting: Student

## 2022-01-02 DIAGNOSIS — Z79899 Other long term (current) drug therapy: Secondary | ICD-10-CM | POA: Diagnosis not present

## 2022-01-02 NOTE — Progress Notes (Signed)
Palliative NP attempted visit. Patient declines to have any visit today. She states she will call me back to schedule appointment; cell and office number left with patient.

## 2022-01-03 DIAGNOSIS — J449 Chronic obstructive pulmonary disease, unspecified: Secondary | ICD-10-CM | POA: Diagnosis not present

## 2022-01-03 DIAGNOSIS — I4821 Permanent atrial fibrillation: Secondary | ICD-10-CM | POA: Diagnosis not present

## 2022-01-03 DIAGNOSIS — J9621 Acute and chronic respiratory failure with hypoxia: Secondary | ICD-10-CM | POA: Diagnosis not present

## 2022-01-03 DIAGNOSIS — I110 Hypertensive heart disease with heart failure: Secondary | ICD-10-CM | POA: Diagnosis not present

## 2022-01-03 DIAGNOSIS — I4892 Unspecified atrial flutter: Secondary | ICD-10-CM | POA: Diagnosis not present

## 2022-01-03 DIAGNOSIS — I5043 Acute on chronic combined systolic (congestive) and diastolic (congestive) heart failure: Secondary | ICD-10-CM | POA: Diagnosis not present

## 2022-01-06 DIAGNOSIS — J449 Chronic obstructive pulmonary disease, unspecified: Secondary | ICD-10-CM | POA: Diagnosis not present

## 2022-01-06 DIAGNOSIS — I5043 Acute on chronic combined systolic (congestive) and diastolic (congestive) heart failure: Secondary | ICD-10-CM | POA: Diagnosis not present

## 2022-01-06 DIAGNOSIS — I4821 Permanent atrial fibrillation: Secondary | ICD-10-CM | POA: Diagnosis not present

## 2022-01-06 DIAGNOSIS — I4892 Unspecified atrial flutter: Secondary | ICD-10-CM | POA: Diagnosis not present

## 2022-01-06 DIAGNOSIS — I110 Hypertensive heart disease with heart failure: Secondary | ICD-10-CM | POA: Diagnosis not present

## 2022-01-06 DIAGNOSIS — J9621 Acute and chronic respiratory failure with hypoxia: Secondary | ICD-10-CM | POA: Diagnosis not present

## 2022-01-07 DIAGNOSIS — J9621 Acute and chronic respiratory failure with hypoxia: Secondary | ICD-10-CM | POA: Diagnosis not present

## 2022-01-07 DIAGNOSIS — I110 Hypertensive heart disease with heart failure: Secondary | ICD-10-CM | POA: Diagnosis not present

## 2022-01-07 DIAGNOSIS — I4892 Unspecified atrial flutter: Secondary | ICD-10-CM | POA: Diagnosis not present

## 2022-01-07 DIAGNOSIS — J449 Chronic obstructive pulmonary disease, unspecified: Secondary | ICD-10-CM | POA: Diagnosis not present

## 2022-01-07 DIAGNOSIS — I5043 Acute on chronic combined systolic (congestive) and diastolic (congestive) heart failure: Secondary | ICD-10-CM | POA: Diagnosis not present

## 2022-01-07 DIAGNOSIS — I4821 Permanent atrial fibrillation: Secondary | ICD-10-CM | POA: Diagnosis not present

## 2022-01-11 DIAGNOSIS — I110 Hypertensive heart disease with heart failure: Secondary | ICD-10-CM | POA: Diagnosis not present

## 2022-01-11 DIAGNOSIS — I4821 Permanent atrial fibrillation: Secondary | ICD-10-CM | POA: Diagnosis not present

## 2022-01-11 DIAGNOSIS — F419 Anxiety disorder, unspecified: Secondary | ICD-10-CM | POA: Diagnosis not present

## 2022-01-11 DIAGNOSIS — Z87891 Personal history of nicotine dependence: Secondary | ICD-10-CM | POA: Diagnosis not present

## 2022-01-11 DIAGNOSIS — Z6838 Body mass index (BMI) 38.0-38.9, adult: Secondary | ICD-10-CM | POA: Diagnosis not present

## 2022-01-11 DIAGNOSIS — F32A Depression, unspecified: Secondary | ICD-10-CM | POA: Diagnosis not present

## 2022-01-11 DIAGNOSIS — J9621 Acute and chronic respiratory failure with hypoxia: Secondary | ICD-10-CM | POA: Diagnosis not present

## 2022-01-11 DIAGNOSIS — I714 Abdominal aortic aneurysm, without rupture, unspecified: Secondary | ICD-10-CM | POA: Diagnosis not present

## 2022-01-11 DIAGNOSIS — E039 Hypothyroidism, unspecified: Secondary | ICD-10-CM | POA: Diagnosis not present

## 2022-01-11 DIAGNOSIS — I5043 Acute on chronic combined systolic (congestive) and diastolic (congestive) heart failure: Secondary | ICD-10-CM | POA: Diagnosis not present

## 2022-01-11 DIAGNOSIS — E669 Obesity, unspecified: Secondary | ICD-10-CM | POA: Diagnosis not present

## 2022-01-11 DIAGNOSIS — Z9181 History of falling: Secondary | ICD-10-CM | POA: Diagnosis not present

## 2022-01-11 DIAGNOSIS — I739 Peripheral vascular disease, unspecified: Secondary | ICD-10-CM | POA: Diagnosis not present

## 2022-01-11 DIAGNOSIS — J449 Chronic obstructive pulmonary disease, unspecified: Secondary | ICD-10-CM | POA: Diagnosis not present

## 2022-01-11 DIAGNOSIS — I4892 Unspecified atrial flutter: Secondary | ICD-10-CM | POA: Diagnosis not present

## 2022-01-12 DIAGNOSIS — I712 Thoracic aortic aneurysm, without rupture, unspecified: Secondary | ICD-10-CM | POA: Insufficient documentation

## 2022-01-12 NOTE — Progress Notes (Signed)
MRN : [id number]  [name] is a 81 y.o. (30-Dec-1940) female who presents with chief complaint of check circulation.  History of Present Illness:   The patient presents to the office for evaluation of an abdominal aortic aneurysm. The aneurysm was found incidentally by CT scan. Patient denies abdominal pain or unusual back pain, no other abdominal complaints.  No history of an abrupt onset of a painful toe associated with blue discoloration.     No family history of AAA.   Patient denies amaurosis fugax or TIA symptoms. There is no history of claudication or rest pain symptoms of the lower extremities.  The patient denies angina or shortness of breath.  CT scan 01/2020 shows an AAA s/p successful EVAR, there is a saccular descending thoracic aortic aneurysm which measured 5.3  No outpatient medications have been marked as taking for the 01/13/22 encounter (Appointment) with Delana Meyer, Dolores Lory, MD.    Past Medical History:  Diagnosis Date   AAA (abdominal aortic aneurysm) ([place])    Anticoagulant long-term use    Failed on Coumadin. On Xarelto   Anxiety    Anxiety and depression    Atrial fib/flutter, transient June 2012   Chronic lower back pain    Chronic respiratory failure with hypoxia ([place]) 12/03/2014   COPD (chronic obstructive pulmonary disease) ([place])    DVT (deep venous thrombosis) ([place]) 2004   BLE   Esophageal dysmotility    Exertional dyspnea    Hiatal hernia    History of bronchitis    History of fibrocystic disease of breast    History of uterine fibroid    HTN (hypertension)    Hyperlipidemia    Hypothyroidism    Normal nuclear stress test 2012   May 2012   OA (osteoarthritis)    "knees; left shoulder"   OSA (obstructive sleep apnea)    "haven't been using my CPAP lately" (01/21/12)   Ovarian mass    right benign   Ovarian mass    benign, right   PAD (peripheral artery disease) ([place])    Small cell carcinoma of lung ([place]) 2004    NON-SMALL CELL CARCINOMA OF THE LUNG, METASTATIC TO THE SUPRACLAVICULAR AND MEDIASTINAL LYMPH NODES; in remission    Past Surgical History:  Procedure Laterality Date   ABDOMINAL AORTIC ANEURYSM REPAIR  ~ 2010   stent graft   BLADDER SURGERY  ~ 2003   sling   BREAST BIOPSY  1960's   both breast's - benign   CARDIOVASCULAR STRESS TEST  2012   No ischemia   CATARACT EXTRACTION W/ INTRAOCULAR LENS  IMPLANT, BILATERAL Bilateral ~ 2009   REPLACEMENT TOTAL KNEE Left ~ 2008   left   THYROIDECTOMY  ~ 1964    Social History Social History   Tobacco Use   Smoking status: Former    Packs/day: 1.00    Years: 40.00    Total pack years: 40.00    Types: Cigarettes    Quit date: 06/23/2001    Years since quitting: 20.5   Smokeless tobacco: Never  Vaping Use   Vaping Use: Never used  Substance Use Topics   Alcohol use: Not Currently    Comment: once a week (glass of wine)   Drug use: No    Family History Family History  Problem Relation Age of Onset   Diabetes type II Mother    Diabetes Mother    Hypertension  Mother    Heart disease Father    Heart attack Father    Heart disease Sister        See's Dr. Acie Fredrickson   Heart disease Brother        before age 66   Breast cancer Neg Hx     No Known Allergies   REVIEW OF SYSTEMS (Negative unless checked)  Constitutional: [] Weight loss  [] Fever  [] Chills Cardiac: [] Chest pain   [] Chest pressure   [] Palpitations   [] Shortness of breath when laying flat   [] Shortness of breath with exertion. Vascular:  [x] Pain in legs with walking   [] Pain in legs at rest  [] History of DVT   [] Phlebitis   [] Swelling in legs   [] Varicose veins   [] Non-healing ulcers Pulmonary:   [] Uses home oxygen   [] Productive cough   [] Hemoptysis   [] Wheeze  [] COPD   [] Asthma Neurologic:  [] Dizziness   [] Seizures   [] History of stroke   [] History of TIA  [] Aphasia   [] Vissual changes   [] Weakness or numbness in arm   [] Weakness or numbness in leg Musculoskeletal:    [] Joint swelling   [] Joint pain   [] Low back pain Hematologic:  [] Easy bruising  [] Easy bleeding   [] Hypercoagulable state   [] Anemic Gastrointestinal:  [] Diarrhea   [] Vomiting  [] Gastroesophageal reflux/heartburn   [] Difficulty swallowing. Genitourinary:  [] Chronic kidney disease   [] Difficult urination  [] Frequent urination   [] Blood in urine Skin:  [] Rashes   [] Ulcers  Psychological:  [] History of anxiety   []  History of major depression.  Physical Examination  There were no vitals filed for this visit. There is no height or weight on file to calculate BMI. Gen: WD/WN, NAD Head: [place]/AT, No temporalis wasting.  Ear/Nose/Throat: Hearing grossly intact, nares w/o erythema or drainage Eyes: PER, EOMI, sclera nonicteric.  Neck: Supple, no masses.  No bruit or JVD.  Pulmonary:  Good air movement, no audible wheezing, no use of accessory muscles.  Cardiac: RRR, normal S1, S2, no Murmurs. Vascular:  mild trophic changes, no open wounds Vessel Right Left  Radial Palpable Palpable  PT Not Palpable Not Palpable  DP Not Palpable Not Palpable  Gastrointestinal: soft, non-distended. No guarding/no peritoneal signs.  Musculoskeletal: M/S 5/5 throughout.  No visible deformity.  Neurologic: CN 2-12 intact. Pain and light touch intact in extremities.  Symmetrical.  Speech is fluent. Motor exam as listed above. Psychiatric: Judgment intact, Mood & affect appropriate for pt's clinical situation. Dermatologic: No rashes or ulcers noted.  No changes consistent with cellulitis.   CBC Lab Results  Component Value Date   WBC 7.2 12/23/2021   HGB 16.5 (H) 12/23/2021   HCT 50.0 (H) 12/23/2021   MCV 106.5 (H) 12/23/2021   PLT 137.0 (L) 12/23/2021    BMET    Component Value Date/Time   NA 139 12/23/2021 1227   NA 141 02/21/2021 1502   K 3.8 12/23/2021 1227   CL 96 12/23/2021 1227   CO2 34 (H) 12/23/2021 1227   GLUCOSE 101 (H) 12/23/2021 1227   BUN 18 12/23/2021 1227   BUN 12 02/21/2021 1502    CREATININE 1.02 12/23/2021 1227   CREATININE 1.06 (H) 10/07/2016 1530   CALCIUM 9.5 12/23/2021 1227   GFRNONAA >60 12/09/2021 0628   GFRAA 61 07/21/2019 1639   CrCl cannot be calculated (Unknown ideal weight.).  COAG Lab Results  Component Value Date   INR 2.3 (H) 05/05/2021   INR 2.5 (H) 05/04/2021   INR 1.71 09/15/2016  Radiology No results found.   Assessment/Plan 1. Aneurysm of descending thoracic aorta without rupture (HCC) Recommend:  The patient has a sacular descending thoracic aortic aneurysm that is 5.0 cm or greater by previous CTscan and based on this study it appears that it is suitable for endovascular treatment.  Patient will require CT angiography of the abdomen and pelvis in order to appropriately plan repair of the TAA.  The risks and benefits as well as the alternative therapies was discussed in detail with the patient. All questions were answered. Therefore a CT angiogram with be scheduled as an outpatient.  The patient will follow up with me in the office after the CT scan to review the study and finalize plan for repair.   - CT Angio Chest/Abd/Pel for Dissection W and/or W/WO; Future  2. Abdominal aortic aneurysm (AAA) without rupture, unspecified part ([place]) Recommend:  Patient is status post successful endovascular repair of the AAA.   No further intervention is required at this time.   No endoleak is detected and the aneurysm sac is stable.  The patient will continue antiplatelet therapy as prescribed as well as aggressive management of hyperlipidemia. Exercise is again strongly encouraged.   However, endografts require continued surveillance with CT scan. This is mandatory to detect any changes that allow repressurization of the aneurysm sac.  The patient is informed that this would be asymptomatic.  The patient is reminded that lifelong routine surveillance is a necessity with an endograft. Patient will continue to follow-up at the specified  interval with ultrasound of the aorta.   - CT Angio Chest/Abd/Pel for Dissection W and/or W/WO; Future  3. Chronic systolic CHF (congestive heart failure) (HCC) Continue cardiac and antihypertensive medications as already ordered and reviewed, no changes at this time.  Continue statin as ordered and reviewed, no changes at this time  Nitrates PRN for chest pain   4. Primary hypertension Continue antihypertensive medications as already ordered, these medications have been reviewed and there are no changes at this time.   5. Permanent atrial fibrillation (HCC) Continue antiarrhythmia medications as already ordered, these medications have been reviewed and there are no changes at this time.  Continue anticoagulation as ordered by Cardiology Service     Hortencia Pilar, MD  01/12/2022 2:03 PM

## 2022-01-13 ENCOUNTER — Ambulatory Visit (INDEPENDENT_AMBULATORY_CARE_PROVIDER_SITE_OTHER): Payer: Medicare Other | Admitting: Vascular Surgery

## 2022-01-13 ENCOUNTER — Encounter (INDEPENDENT_AMBULATORY_CARE_PROVIDER_SITE_OTHER): Payer: Medicare Other | Admitting: Vascular Surgery

## 2022-01-13 ENCOUNTER — Encounter (INDEPENDENT_AMBULATORY_CARE_PROVIDER_SITE_OTHER): Payer: Self-pay | Admitting: Vascular Surgery

## 2022-01-13 VITALS — BP 115/79 | HR 96 | Resp 20 | Wt 199.4 lb

## 2022-01-13 DIAGNOSIS — I5022 Chronic systolic (congestive) heart failure: Secondary | ICD-10-CM | POA: Diagnosis not present

## 2022-01-13 DIAGNOSIS — I714 Abdominal aortic aneurysm, without rupture, unspecified: Secondary | ICD-10-CM

## 2022-01-13 DIAGNOSIS — I7123 Aneurysm of the descending thoracic aorta, without rupture: Secondary | ICD-10-CM

## 2022-01-13 DIAGNOSIS — I4821 Permanent atrial fibrillation: Secondary | ICD-10-CM | POA: Diagnosis not present

## 2022-01-13 DIAGNOSIS — I10 Essential (primary) hypertension: Secondary | ICD-10-CM | POA: Diagnosis not present

## 2022-01-14 DIAGNOSIS — Z9181 History of falling: Secondary | ICD-10-CM

## 2022-01-14 DIAGNOSIS — I5043 Acute on chronic combined systolic (congestive) and diastolic (congestive) heart failure: Secondary | ICD-10-CM | POA: Diagnosis not present

## 2022-01-14 DIAGNOSIS — I4892 Unspecified atrial flutter: Secondary | ICD-10-CM | POA: Diagnosis not present

## 2022-01-14 DIAGNOSIS — Z6838 Body mass index (BMI) 38.0-38.9, adult: Secondary | ICD-10-CM

## 2022-01-14 DIAGNOSIS — I110 Hypertensive heart disease with heart failure: Secondary | ICD-10-CM | POA: Diagnosis not present

## 2022-01-14 DIAGNOSIS — I739 Peripheral vascular disease, unspecified: Secondary | ICD-10-CM

## 2022-01-14 DIAGNOSIS — E669 Obesity, unspecified: Secondary | ICD-10-CM

## 2022-01-14 DIAGNOSIS — E039 Hypothyroidism, unspecified: Secondary | ICD-10-CM

## 2022-01-14 DIAGNOSIS — J9621 Acute and chronic respiratory failure with hypoxia: Secondary | ICD-10-CM | POA: Diagnosis not present

## 2022-01-14 DIAGNOSIS — Z87891 Personal history of nicotine dependence: Secondary | ICD-10-CM

## 2022-01-14 DIAGNOSIS — F419 Anxiety disorder, unspecified: Secondary | ICD-10-CM

## 2022-01-14 DIAGNOSIS — I714 Abdominal aortic aneurysm, without rupture, unspecified: Secondary | ICD-10-CM

## 2022-01-14 DIAGNOSIS — I4821 Permanent atrial fibrillation: Secondary | ICD-10-CM | POA: Diagnosis not present

## 2022-01-14 DIAGNOSIS — F32A Depression, unspecified: Secondary | ICD-10-CM

## 2022-01-14 DIAGNOSIS — J449 Chronic obstructive pulmonary disease, unspecified: Secondary | ICD-10-CM | POA: Diagnosis not present

## 2022-01-15 ENCOUNTER — Telehealth (INDEPENDENT_AMBULATORY_CARE_PROVIDER_SITE_OTHER): Payer: Self-pay | Admitting: Vascular Surgery

## 2022-01-15 NOTE — Telephone Encounter (Signed)
LVM for pt advising that she can call the Radiology scheduling at [phone] to schedule her CT ordered by Dr. Delana Meyer. I advised to call us back at the office to make a CT results appt. Nothing further needed at this time.

## 2022-01-22 NOTE — Progress Notes (Signed)
Cancelled.  

## 2022-01-23 DIAGNOSIS — D696 Thrombocytopenia, unspecified: Secondary | ICD-10-CM | POA: Diagnosis not present

## 2022-01-23 DIAGNOSIS — E441 Mild protein-calorie malnutrition: Secondary | ICD-10-CM | POA: Diagnosis not present

## 2022-01-23 DIAGNOSIS — I110 Hypertensive heart disease with heart failure: Secondary | ICD-10-CM | POA: Diagnosis not present

## 2022-01-23 DIAGNOSIS — J9621 Acute and chronic respiratory failure with hypoxia: Secondary | ICD-10-CM | POA: Diagnosis not present

## 2022-01-23 DIAGNOSIS — I4892 Unspecified atrial flutter: Secondary | ICD-10-CM | POA: Diagnosis not present

## 2022-01-23 DIAGNOSIS — I5043 Acute on chronic combined systolic (congestive) and diastolic (congestive) heart failure: Secondary | ICD-10-CM | POA: Diagnosis not present

## 2022-01-23 DIAGNOSIS — J449 Chronic obstructive pulmonary disease, unspecified: Secondary | ICD-10-CM | POA: Diagnosis not present

## 2022-01-23 DIAGNOSIS — N182 Chronic kidney disease, stage 2 (mild): Secondary | ICD-10-CM | POA: Diagnosis not present

## 2022-01-23 DIAGNOSIS — I130 Hypertensive heart and chronic kidney disease with heart failure and stage 1 through stage 4 chronic kidney disease, or unspecified chronic kidney disease: Secondary | ICD-10-CM | POA: Diagnosis not present

## 2022-01-23 DIAGNOSIS — R600 Localized edema: Secondary | ICD-10-CM | POA: Diagnosis not present

## 2022-01-23 DIAGNOSIS — R269 Unspecified abnormalities of gait and mobility: Secondary | ICD-10-CM | POA: Diagnosis not present

## 2022-01-23 DIAGNOSIS — E039 Hypothyroidism, unspecified: Secondary | ICD-10-CM | POA: Diagnosis not present

## 2022-01-23 DIAGNOSIS — I4821 Permanent atrial fibrillation: Secondary | ICD-10-CM | POA: Diagnosis not present

## 2022-01-23 DIAGNOSIS — I5042 Chronic combined systolic (congestive) and diastolic (congestive) heart failure: Secondary | ICD-10-CM | POA: Diagnosis not present

## 2022-01-24 DIAGNOSIS — Z79899 Other long term (current) drug therapy: Secondary | ICD-10-CM | POA: Diagnosis not present

## 2022-01-24 DIAGNOSIS — R7989 Other specified abnormal findings of blood chemistry: Secondary | ICD-10-CM | POA: Diagnosis not present

## 2022-01-24 DIAGNOSIS — E785 Hyperlipidemia, unspecified: Secondary | ICD-10-CM | POA: Diagnosis not present

## 2022-01-24 DIAGNOSIS — R5383 Other fatigue: Secondary | ICD-10-CM | POA: Diagnosis not present

## 2022-01-25 DIAGNOSIS — R0902 Hypoxemia: Secondary | ICD-10-CM | POA: Insufficient documentation

## 2022-01-25 NOTE — Assessment & Plan Note (Deleted)
Continued oxygen requirement.  We reviewed the necessity of continuous oxygen with family and patient.  No evidence of acute worsening.

## 2022-01-25 NOTE — Assessment & Plan Note (Signed)
Stable, chronic.  Continue current medication.    

## 2022-01-25 NOTE — Assessment & Plan Note (Signed)
Continued oxygen requirement.  We reviewed the necessity of continuous oxygen with family and patient.  No evidence of acute worsening.

## 2022-01-25 NOTE — Assessment & Plan Note (Addendum)
Euvolemic in office today.Continue torsemide 40 mg   twice daily Check daily weight.. call if weight increases > 3 lbs  in 24 hours.

## 2022-01-25 NOTE — Assessment & Plan Note (Signed)
Improved on current treatment.

## 2022-01-29 ENCOUNTER — Ambulatory Visit
Admission: RE | Admit: 2022-01-29 | Discharge: 2022-01-29 | Disposition: A | Discharge status: Home / Self Care | Payer: Medicare Other | Source: Ambulatory Visit | Attending: Vascular Surgery | Admitting: Vascular Surgery

## 2022-01-29 DIAGNOSIS — I7123 Aneurysm of the descending thoracic aorta, without rupture: Secondary | ICD-10-CM | POA: Insufficient documentation

## 2022-01-29 DIAGNOSIS — I714 Abdominal aortic aneurysm, without rupture, unspecified: Secondary | ICD-10-CM | POA: Diagnosis not present

## 2022-01-29 DIAGNOSIS — K429 Umbilical hernia without obstruction or gangrene: Secondary | ICD-10-CM | POA: Diagnosis not present

## 2022-01-29 DIAGNOSIS — I712 Thoracic aortic aneurysm, without rupture, unspecified: Secondary | ICD-10-CM | POA: Diagnosis not present

## 2022-01-29 DIAGNOSIS — J432 Centrilobular emphysema: Secondary | ICD-10-CM | POA: Diagnosis not present

## 2022-01-29 DIAGNOSIS — I774 Celiac artery compression syndrome: Secondary | ICD-10-CM | POA: Diagnosis not present

## 2022-01-29 LAB — POCT I-STAT CREATININE: Creatinine, Ser: 1.1 mg/dL — ABNORMAL HIGH (ref 0.44–1.00)

## 2022-01-29 MED ORDER — IOHEXOL 350 MG/ML SOLN
100.0000 mL | Freq: Once | INTRAVENOUS | Status: AC | PRN
  Administered 2022-01-29: 100 mL via INTRAVENOUS

## 2022-02-09 NOTE — Progress Notes (Signed)
MRN : [id number]  [name] is a 81 y.o. (07/08/1940) female who presents with chief complaint of check circulation.  History of Present Illness:   The patient presents to the office for evaluation of an abdominal aortic aneurysm. The aneurysm was found incidentally by CT scan. Patient denies abdominal pain or unusual back pain, no other abdominal complaints.  No history of an abrupt onset of a painful toe associated with blue discoloration.      No family history of AAA.    Patient denies amaurosis fugax or TIA symptoms. There is no history of claudication or rest pain symptoms of the lower extremities.  The patient denies angina or shortness of breath.   Previous CT scan 01/2020 shows an AAA s/p successful EVAR, there is a saccular descending thoracic aortic aneurysm which measured 5.3  CT scan dated  01/31/2022: IMPRESSION: 1. Stable saccular aneurysm along the anterior aspect of the mid descending thoracic aorta. Maximum dimension of this saccular aneurysm is 5.7 cm. 2. Bifurcated abdominal aortic stent graft is patent. The residual abdominal aortic aneurysm sac is stable without endoleak.  No outpatient medications have been marked as taking for the 02/10/22 encounter (Appointment) with Delana Meyer, Dolores Lory, MD.    Past Medical History:  Diagnosis Date   AAA (abdominal aortic aneurysm) ([place])    Anticoagulant long-term use    Failed on Coumadin. On Xarelto   Anxiety    Anxiety and depression    Atrial fib/flutter, transient June 2012   Chronic lower back pain    Chronic respiratory failure with hypoxia ([place]) 12/03/2014   COPD (chronic obstructive pulmonary disease) ([place])    DVT (deep venous thrombosis) ([place]) 2004   BLE   Esophageal dysmotility    Exertional dyspnea    Hiatal hernia    History of bronchitis    History of fibrocystic disease of breast    History of uterine fibroid    HTN (hypertension)    Hyperlipidemia    Hypothyroidism     Normal nuclear stress test 2012   May 2012   OA (osteoarthritis)    "knees; left shoulder"   OSA (obstructive sleep apnea)    "haven't been using my CPAP lately" (01/21/12)   Ovarian mass    right benign   Ovarian mass    benign, right   PAD (peripheral artery disease) ([place])    Small cell carcinoma of lung ([place]) 2004   NON-SMALL CELL CARCINOMA OF THE LUNG, METASTATIC TO THE SUPRACLAVICULAR AND MEDIASTINAL LYMPH NODES; in remission    Past Surgical History:  Procedure Laterality Date   ABDOMINAL AORTIC ANEURYSM REPAIR  ~ 2010   stent graft   BLADDER SURGERY  ~ 2003   sling   BREAST BIOPSY  1960's   both breast's - benign   CARDIOVASCULAR STRESS TEST  2012   No ischemia   CATARACT EXTRACTION W/ INTRAOCULAR LENS  IMPLANT, BILATERAL Bilateral ~ 2009   REPLACEMENT TOTAL KNEE Left ~ 2008   left   THYROIDECTOMY  ~ 1964    Social History Social History   Tobacco Use   Smoking status: Former    Packs/day: 1.00    Years: 40.00    Total pack years: 40.00    Types: Cigarettes    Quit date: 06/23/2001    Years since quitting: 20.6   Smokeless tobacco: Never  Vaping Use   Vaping Use: Never used  Substance Use Topics   Alcohol use: Not Currently    Comment: once a week (glass of wine)   Drug use: No    Family History Family History  Problem Relation Age of Onset   Diabetes type II Mother    Diabetes Mother    Hypertension Mother    Heart disease Father    Heart attack Father    Heart disease Sister        See's Dr. Acie Fredrickson   Heart disease Brother        before age 50   Breast cancer Neg Hx     No Known Allergies   REVIEW OF SYSTEMS (Negative unless checked)  Constitutional: [] Weight loss  [] Fever  [] Chills Cardiac: [] Chest pain   [] Chest pressure   [] Palpitations   [] Shortness of breath when laying flat   [] Shortness of breath with exertion. Vascular:  [x] Pain in legs with walking   [] Pain in legs at rest  [] History of DVT   [] Phlebitis   [] Swelling in legs    [] Varicose veins   [] Non-healing ulcers Pulmonary:   [] Uses home oxygen   [] Productive cough   [] Hemoptysis   [] Wheeze  [x] COPD   [] Asthma Neurologic:  [] Dizziness   [] Seizures   [] History of stroke   [] History of TIA  [] Aphasia   [] Vissual changes   [] Weakness or numbness in arm   [] Weakness or numbness in leg Musculoskeletal:   [] Joint swelling   [] Joint pain   [] Low back pain Hematologic:  [] Easy bruising  [] Easy bleeding   [] Hypercoagulable state   [] Anemic Gastrointestinal:  [] Diarrhea   [] Vomiting  [] Gastroesophageal reflux/heartburn   [] Difficulty swallowing. Genitourinary:  [] Chronic kidney disease   [] Difficult urination  [] Frequent urination   [] Blood in urine Skin:  [] Rashes   [] Ulcers  Psychological:  [] History of anxiety   []  History of major depression.  Physical Examination  There were no vitals filed for this visit. There is no height or weight on file to calculate BMI. Gen: WD/WN, NAD Head: [place]/AT, No temporalis wasting.  Ear/Nose/Throat: Hearing grossly intact, nares w/o erythema or drainage Eyes: PER, EOMI, sclera nonicteric.  Neck: Supple, no masses.  No bruit or JVD.  Pulmonary:  Good air movement, no audible wheezing, no use of accessory muscles.  Cardiac: RRR, normal S1, S2, no Murmurs. Vascular:  mild trophic changes, no open wounds Vessel Right Left  Radial Palpable Palpable  PT Not Palpable Not Palpable  DP Not Palpable Not Palpable  Gastrointestinal: soft, non-distended. No guarding/no peritoneal signs.  Musculoskeletal: M/S 5/5 throughout.  No visible deformity.  Neurologic: CN 2-12 intact. Pain and light touch intact in extremities.  Symmetrical.  Speech is fluent. Motor exam as listed above. Psychiatric: Judgment intact, Mood & affect appropriate for pt's clinical situation. Dermatologic: No rashes or ulcers noted.  No changes consistent with cellulitis.   CBC Lab Results  Component Value Date   WBC 7.2 12/23/2021   HGB 16.5 (H) 12/23/2021   HCT 50.0  (H) 12/23/2021   MCV 106.5 (H) 12/23/2021   PLT 137.0 (L) 12/23/2021    BMET    Component Value Date/Time   NA 139 12/23/2021 1227   NA 141 02/21/2021 1502   K 3.8 12/23/2021 1227   CL 96 12/23/2021 1227   CO2 34 (H) 12/23/2021 1227   GLUCOSE 101 (H) 12/23/2021 1227   BUN 18 12/23/2021 1227   BUN 12 02/21/2021 1502   CREATININE 1.10 (H) 01/29/2022 1418   CREATININE 1.06 (H) 10/07/2016 1530  CALCIUM 9.5 12/23/2021 1227   GFRNONAA >60 12/09/2021 0628   GFRAA 61 07/21/2019 1639   CrCl cannot be calculated (Unknown ideal weight.).  COAG Lab Results  Component Value Date   INR 2.3 (H) 05/05/2021   INR 2.5 (H) 05/04/2021   INR 1.71 09/15/2016    Radiology CT Angio Chest/Abd/Pel for Dissection W and/or W/WO  Result Date: 01/31/2022 CLINICAL DATA:  Follow-up thoracic aortic aneurysm. History of endovascular repair to the abdominal aorta. EXAM: CT ANGIOGRAPHY CHEST, ABDOMEN AND PELVIS TECHNIQUE: Non-contrast CT of the abdomen and pelvis was initially obtained. Multidetector CT imaging through the chest, abdomen and pelvis was performed using the standard protocol during bolus administration of intravenous contrast. Multiplanar reconstructed images and MIPs were obtained and reviewed to evaluate the vascular anatomy. RADIATION DOSE REDUCTION: This exam was performed according to the departmental dose-optimization program which includes automated exposure control, adjustment of the mA and/or kV according to patient size and/or use of iterative reconstruction technique. CONTRAST:  121mL OMNIPAQUE IOHEXOL 350 MG/ML SOLN COMPARISON:  CTA chest 02/14/2020 FINDINGS: CTA CHEST FINDINGS Cardiovascular: Atherosclerotic calcifications throughout the thoracic aorta. Ascending thoracic aorta measures 3.4 cm without aneurysm. Great vessels are patent. Proximal descending thoracic aorta measures 2.7 cm. Again noted is a saccular aneurysm involving the mid descending thoracic aorta. Saccular aneurysm  projects along the anterior aspect of the thoracic aorta measuring 3.7 cm in width, previously 3.6 cm; 2.0 cm in AP dimension and stable; 2.8 cm in length and stable. Distal descending thoracic aorta measures 2.8 cm and stable. Main pulmonary arteries are patent. Heart size is stable. No significant pericardial effusion. Numerous venous collaterals in the posterior right chest of uncertain etiology because SVC and right innominate vein appear to be patent. Mediastinum/Nodes: Evidence for thyroid nodules which are poorly characterized on this examination. Patient has had a previous thyroid ultrasound. No significant mediastinal or hilar lymph node enlargement. No axillary lymph node enlargement. Lungs/Pleura: Small amount of right pleural fluid and/or pleural thickening which is new. Trachea and mainstem bronchi are patent. Centrilobular emphysema. Focal pleural thickening in the anterior right lung on sequence 5 image 50 has minimally changed. Probable 3 mm nodule in the left lower lobe on sequence 5 image 82 was probably present on the previous examination. Questionable nodular densities along the medial right upper lung on sequence 5 image 23. These questionable nodules are best seen on the sagittal images, sequence 8, image 132. These 2 adjacent nodular areas each measure 8-9 mm. Musculoskeletal: No acute bone abnormality. Multilevel degenerative disc and endplate changes in thoracic spine. Review of the MIP images confirms the above findings. CTA ABDOMEN AND PELVIS FINDINGS VASCULAR Aorta: Endovascular repair of the abdominal aortic aneurysm with a bifurcated aortic stent graft. Aortic stent graft is in a stable position and patent. Residual aneurysm sac on sequence 4 image 106 measures 3.3 x 2.0 cm and stable since 2021. There is no evidence to suggest an endoleak. Stents terminate in the common iliac arteries bilaterally. Aorta at the hiatus measures to 3.1 cm and stable. Celiac: Mild narrowing near the origin  of the celiac artery. No evidence for aneurysm or dissection. Main branch vessels are patent. SMA: Patent without evidence of aneurysm, dissection, vasculitis or significant stenosis. Renals: Both renal arteries are patent without evidence of aneurysm, dissection, vasculitis, fibromuscular dysplasia or significant stenosis. IMA: Origin and proximal aspect are occluded. There is distal reconstitution through collateral flow. Inflow: Stents terminate in the common iliac arteries bilaterally. Common, internal and external iliac  arteries are patent bilaterally without aneurysm, dissection or significant stenosis. Proximal outflow: Proximal femoral arteries are patent bilaterally. Veins: IVC and common iliac veins are patent. Bilateral renal veins are patent. Limited evaluation of the portal veins. SMV is patent. Review of the MIP images confirms the above findings. NON-VASCULAR Hepatobiliary: Mild distention of the gallbladder without surrounding inflammatory changes. No acute abnormality in the liver. Pancreas: Unremarkable. No pancreatic ductal dilatation or surrounding inflammatory changes. Spleen: Normal in size without focal abnormality. Adrenals/Urinary Tract: Adrenal glands are within normal limits. Negative for kidney stones. No hydronephrosis. No suspicious renal lesions. Normal appearance of the urinary bladder. Stomach/Bowel: Normal appearance of the stomach. No bowel dilatation or focal bowel inflammation. Lymphatic: Small amount of fat stranding in the inguinal regions bilaterally and this appears stable. No lymph node enlargement in the abdomen or pelvis. Reproductive: Stable appearance of the uterus and adnexal structures. Again noted is a prominent right ovary/right adnexa measuring 4.2 x 3.0 cm and minimally changed since CT from 05/29/2015. Other: Negative for ascites. Negative for free air. Small periumbilical hernia containing fat. Musculoskeletal: Degenerative changes in both hips, right side  greater than left. Severe facet facet arthropathy in the lower lumbar spine. Disc space narrowing with endplate irregularity at L5-S1 appears stable. No acute bone abnormality. Review of the MIP images confirms the above findings. IMPRESSION: 1. Stable saccular aneurysm along the anterior aspect of the mid descending thoracic aorta. Maximum dimension of this saccular aneurysm is 3.7 cm. 2. Bifurcated abdominal aortic stent graft is patent. The residual abdominal aortic aneurysm sac is stable without endoleak. 3. New pleural thickening/small amount of pleural fluid in the right hemithorax. In addition, there are questionable nodular densities along the right lung anterior apex measuring 8-9 mm. Recommended short-term follow-up chest CT in 2-3 months to evaluate for stability or possible resolution of these questionable nodular areas and pleural thickening/pleural fluid. Neoplastic process cannot be excluded. 4. Aortic Atherosclerosis (ICD10-I70.0) and Emphysema (ICD10-J43.9). 5. Thyroid nodules. Electronically Signed   By: Markus Daft M.D.   On: 01/31/2022 08:13     Assessment/Plan 1. Abdominal aortic aneurysm (AAA) without rupture, unspecified part ([place]) Recommend:  Patient is status post successful endovascular repair of the AAA.   No further intervention is required at this time.   No endoleak is detected and the aneurysm sac is stable.  The patient will continue antiplatelet therapy as prescribed as well as aggressive management of hyperlipidemia. Exercise is again strongly encouraged.   However, endografts require continued surveillance with ultrasound or CT scan. This is mandatory to detect any changes that allow repressurization of the aneurysm sac.  The patient is informed that this would be asymptomatic.  The patient is reminded that lifelong routine surveillance is a necessity with an endograft. Patient will continue to follow-up at the specified interval with ultrasound of the aorta.   2.  Aneurysm of descending thoracic aorta without rupture (HCC) Recommend:  No surgery or intervention is indicated at this time.  The patient has an asymptomatic sacular descending thoracic aortic aneurysm that is 5.7 cm in maximal diameter and has increased in size.  At this point that she should undergo repair with a stent graft if cleared by pulmonary and cardiology.  Risk and benefits were reviewed the patient.  Indications for the procedure were reviewed.  All questions were answered, the patient agrees to proceed.     I have also discussed optimizing medical management with hypertension and lipid control.  The patient is also encouraged  to exercise a minimum of 30 minutes 4 times a week.   The patient voices their understanding.  3. Permanent atrial fibrillation (HCC) Continue antiarrhythmia medications as already ordered, these medications have been reviewed and there are no changes at this time.  Continue anticoagulation as ordered by Cardiology Service   4. Primary hypertension Continue antihypertensive medications as already ordered, these medications have been reviewed and there are no changes at this time.   5. Pulmonary emphysema, unspecified emphysema type ([place]) Continue pulmonary medications and aerosols as already ordered, these medications have been reviewed and there are no changes at this time.    6. Mixed hyperlipidemia Continue statin as ordered and reviewed, no changes at this time     Hortencia Pilar, MD  02/09/2022 5:19 PM

## 2022-02-10 ENCOUNTER — Ambulatory Visit (INDEPENDENT_AMBULATORY_CARE_PROVIDER_SITE_OTHER): Payer: Medicare Other | Admitting: Vascular Surgery

## 2022-02-10 ENCOUNTER — Encounter (INDEPENDENT_AMBULATORY_CARE_PROVIDER_SITE_OTHER): Payer: Self-pay | Admitting: Vascular Surgery

## 2022-02-10 VITALS — BP 104/77 | HR 99 | Resp 18 | Wt 199.8 lb

## 2022-02-10 DIAGNOSIS — I7123 Aneurysm of the descending thoracic aorta, without rupture: Secondary | ICD-10-CM

## 2022-02-10 DIAGNOSIS — I4821 Permanent atrial fibrillation: Secondary | ICD-10-CM | POA: Diagnosis not present

## 2022-02-10 DIAGNOSIS — J439 Emphysema, unspecified: Secondary | ICD-10-CM | POA: Diagnosis not present

## 2022-02-10 DIAGNOSIS — I10 Essential (primary) hypertension: Secondary | ICD-10-CM

## 2022-02-10 DIAGNOSIS — I714 Abdominal aortic aneurysm, without rupture, unspecified: Secondary | ICD-10-CM

## 2022-02-10 DIAGNOSIS — E782 Mixed hyperlipidemia: Secondary | ICD-10-CM | POA: Diagnosis not present

## 2022-02-17 ENCOUNTER — Encounter (INDEPENDENT_AMBULATORY_CARE_PROVIDER_SITE_OTHER): Payer: Self-pay | Admitting: Vascular Surgery

## 2022-02-17 DIAGNOSIS — T45515A Adverse effect of anticoagulants, initial encounter: Secondary | ICD-10-CM | POA: Diagnosis not present

## 2022-02-17 DIAGNOSIS — R040 Epistaxis: Secondary | ICD-10-CM | POA: Diagnosis not present

## 2022-02-19 DIAGNOSIS — I4892 Unspecified atrial flutter: Secondary | ICD-10-CM | POA: Diagnosis not present

## 2022-02-19 DIAGNOSIS — D696 Thrombocytopenia, unspecified: Secondary | ICD-10-CM | POA: Diagnosis not present

## 2022-02-20 ENCOUNTER — Ambulatory Visit: Payer: Medicare Other | Admitting: Internal Medicine

## 2022-02-20 DIAGNOSIS — I130 Hypertensive heart and chronic kidney disease with heart failure and stage 1 through stage 4 chronic kidney disease, or unspecified chronic kidney disease: Secondary | ICD-10-CM | POA: Diagnosis not present

## 2022-02-20 DIAGNOSIS — I739 Peripheral vascular disease, unspecified: Secondary | ICD-10-CM | POA: Diagnosis not present

## 2022-02-20 DIAGNOSIS — R269 Unspecified abnormalities of gait and mobility: Secondary | ICD-10-CM | POA: Diagnosis not present

## 2022-02-20 DIAGNOSIS — J449 Chronic obstructive pulmonary disease, unspecified: Secondary | ICD-10-CM | POA: Diagnosis not present

## 2022-02-20 DIAGNOSIS — R600 Localized edema: Secondary | ICD-10-CM | POA: Diagnosis not present

## 2022-02-20 DIAGNOSIS — G609 Hereditary and idiopathic neuropathy, unspecified: Secondary | ICD-10-CM | POA: Diagnosis not present

## 2022-02-20 DIAGNOSIS — N182 Chronic kidney disease, stage 2 (mild): Secondary | ICD-10-CM | POA: Diagnosis not present

## 2022-02-20 DIAGNOSIS — Z7901 Long term (current) use of anticoagulants: Secondary | ICD-10-CM | POA: Diagnosis not present

## 2022-02-20 DIAGNOSIS — I480 Paroxysmal atrial fibrillation: Secondary | ICD-10-CM | POA: Diagnosis not present

## 2022-02-20 DIAGNOSIS — Z9981 Dependence on supplemental oxygen: Secondary | ICD-10-CM | POA: Diagnosis not present

## 2022-02-20 DIAGNOSIS — I5042 Chronic combined systolic (congestive) and diastolic (congestive) heart failure: Secondary | ICD-10-CM | POA: Diagnosis not present

## 2022-02-20 NOTE — Progress Notes (Deleted)
   Follow-up Outpatient Visit Date: 02/20/2022  Primary Care Provider: Jinny Sanders, MD [place] [address]  Chief Complaint: ***  HPI:  [name] is a 81 y.o. female with history of chronic HFrEF, permanent atrial fibrillation, AAA, hypertension, hyperlipidemia, non-small cell lung cancer, bilateral lower extremity DVTs, COPD, and pulmonary hypertension, who presents for follow-up of ***.  --------------------------------------------------------------------------------------------------  ***  Recent CV Pertinent Labs: Lab Results  Component Value Date   CHOL 198 11/14/2021   HDL 45.00 11/14/2021   LDLCALC 130 (H) 11/14/2021   TRIG 113.0 11/14/2021   CHOLHDL 4 11/14/2021   INR 2.3 (H) 05/05/2021   BNP 367.6 (H) 12/02/2021   K 3.8 12/23/2021   MG 2.0 12/23/2021   BUN 18 12/23/2021   BUN 12 02/21/2021   CREATININE 1.10 (H) 01/29/2022   CREATININE 1.06 (H) 10/07/2016    Past medical and surgical history were reviewed and updated in EPIC.  No outpatient medications have been marked as taking for the 02/20/22 encounter (Appointment) with Kodie Kishi, Harrell Gave, MD.    Allergies: Patient has no known allergies.  Social History   Tobacco Use   Smoking status: Former    Packs/day: 1.00    Years: 40.00    Total pack years: 40.00    Types: Cigarettes    Quit date: 06/23/2001    Years since quitting: 20.6   Smokeless tobacco: Never  Vaping Use   Vaping Use: Never used  Substance Use Topics   Alcohol use: Not Currently    Comment: once a week (glass of wine)   Drug use: No    Family History  Problem Relation Age of Onset   Diabetes type II Mother    Diabetes Mother    Hypertension Mother    Heart disease Father    Heart attack Father    Heart disease Sister        See's Dr. Acie Fredrickson   Heart disease Brother        before age 55   Breast cancer Neg Hx     Review of Systems: A 12-system review of systems was performed and was negative except  as noted in the HPI.  --------------------------------------------------------------------------------------------------  Physical Exam: There were no vitals taken for this visit.  General:  NAD. Neck: No JVD or HJR. Lungs: Clear to auscultation bilaterally without wheezes or crackles. Heart: Regular rate and rhythm without murmurs, rubs, or gallops. Abdomen: Soft, nontender, nondistended. Extremities: No lower extremity edema.  EKG:  ***  Lab Results  Component Value Date   WBC 7.2 12/23/2021   HGB 16.5 (H) 12/23/2021   HCT 50.0 (H) 12/23/2021   MCV 106.5 (H) 12/23/2021   PLT 137.0 (L) 12/23/2021    Lab Results  Component Value Date   NA 139 12/23/2021   K 3.8 12/23/2021   CL 96 12/23/2021   CO2 34 (H) 12/23/2021   BUN 18 12/23/2021   CREATININE 1.10 (H) 01/29/2022   GLUCOSE 101 (H) 12/23/2021   ALT 10 11/14/2021    Lab Results  Component Value Date   CHOL 198 11/14/2021   HDL 45.00 11/14/2021   LDLCALC 130 (H) 11/14/2021   TRIG 113.0 11/14/2021   CHOLHDL 4 11/14/2021    --------------------------------------------------------------------------------------------------  ASSESSMENT AND PLAN: Nelva Bush, MD 02/20/2022 6:44 AM

## 2022-03-05 ENCOUNTER — Institutional Professional Consult (permissible substitution): Payer: Medicare Other | Admitting: Student in an Organized Health Care Education/Training Program

## 2022-03-07 DIAGNOSIS — G609 Hereditary and idiopathic neuropathy, unspecified: Secondary | ICD-10-CM | POA: Diagnosis not present

## 2022-03-07 DIAGNOSIS — J9621 Acute and chronic respiratory failure with hypoxia: Secondary | ICD-10-CM | POA: Diagnosis not present

## 2022-03-07 DIAGNOSIS — I4821 Permanent atrial fibrillation: Secondary | ICD-10-CM | POA: Diagnosis not present

## 2022-03-07 DIAGNOSIS — I130 Hypertensive heart and chronic kidney disease with heart failure and stage 1 through stage 4 chronic kidney disease, or unspecified chronic kidney disease: Secondary | ICD-10-CM | POA: Diagnosis not present

## 2022-03-07 DIAGNOSIS — I4892 Unspecified atrial flutter: Secondary | ICD-10-CM | POA: Diagnosis not present

## 2022-03-07 DIAGNOSIS — F32A Depression, unspecified: Secondary | ICD-10-CM | POA: Diagnosis not present

## 2022-03-07 DIAGNOSIS — Z6832 Body mass index (BMI) 32.0-32.9, adult: Secondary | ICD-10-CM | POA: Diagnosis not present

## 2022-03-07 DIAGNOSIS — I739 Peripheral vascular disease, unspecified: Secondary | ICD-10-CM | POA: Diagnosis not present

## 2022-03-07 DIAGNOSIS — Z9181 History of falling: Secondary | ICD-10-CM | POA: Diagnosis not present

## 2022-03-07 DIAGNOSIS — I5043 Acute on chronic combined systolic (congestive) and diastolic (congestive) heart failure: Secondary | ICD-10-CM | POA: Diagnosis not present

## 2022-03-07 DIAGNOSIS — I088 Other rheumatic multiple valve diseases: Secondary | ICD-10-CM | POA: Diagnosis not present

## 2022-03-07 DIAGNOSIS — E039 Hypothyroidism, unspecified: Secondary | ICD-10-CM | POA: Diagnosis not present

## 2022-03-07 DIAGNOSIS — Z9981 Dependence on supplemental oxygen: Secondary | ICD-10-CM | POA: Diagnosis not present

## 2022-03-07 DIAGNOSIS — U071 COVID-19: Secondary | ICD-10-CM | POA: Diagnosis not present

## 2022-03-07 DIAGNOSIS — E669 Obesity, unspecified: Secondary | ICD-10-CM | POA: Diagnosis not present

## 2022-03-07 DIAGNOSIS — W19XXXD Unspecified fall, subsequent encounter: Secondary | ICD-10-CM | POA: Diagnosis not present

## 2022-03-07 DIAGNOSIS — I714 Abdominal aortic aneurysm, without rupture, unspecified: Secondary | ICD-10-CM | POA: Diagnosis not present

## 2022-03-07 DIAGNOSIS — J449 Chronic obstructive pulmonary disease, unspecified: Secondary | ICD-10-CM | POA: Diagnosis not present

## 2022-03-07 DIAGNOSIS — D631 Anemia in chronic kidney disease: Secondary | ICD-10-CM | POA: Diagnosis not present

## 2022-03-07 DIAGNOSIS — N182 Chronic kidney disease, stage 2 (mild): Secondary | ICD-10-CM | POA: Diagnosis not present

## 2022-03-07 DIAGNOSIS — F419 Anxiety disorder, unspecified: Secondary | ICD-10-CM | POA: Diagnosis not present

## 2022-03-11 ENCOUNTER — Ambulatory Visit (INDEPENDENT_AMBULATORY_CARE_PROVIDER_SITE_OTHER): Payer: Medicare Other | Admitting: Pulmonary Disease

## 2022-03-11 ENCOUNTER — Encounter: Payer: Self-pay | Admitting: Pulmonary Disease

## 2022-03-11 VITALS — BP 98/58 | HR 107 | Temp 98.2°F

## 2022-03-11 DIAGNOSIS — J9622 Acute and chronic respiratory failure with hypercapnia: Secondary | ICD-10-CM

## 2022-03-11 DIAGNOSIS — I272 Pulmonary hypertension, unspecified: Secondary | ICD-10-CM | POA: Diagnosis not present

## 2022-03-11 DIAGNOSIS — J9621 Acute and chronic respiratory failure with hypoxia: Secondary | ICD-10-CM | POA: Diagnosis not present

## 2022-03-11 DIAGNOSIS — I4891 Unspecified atrial fibrillation: Secondary | ICD-10-CM | POA: Diagnosis not present

## 2022-03-11 DIAGNOSIS — Z01811 Encounter for preprocedural respiratory examination: Secondary | ICD-10-CM | POA: Diagnosis not present

## 2022-03-11 DIAGNOSIS — J449 Chronic obstructive pulmonary disease, unspecified: Secondary | ICD-10-CM | POA: Diagnosis not present

## 2022-03-11 MED ORDER — ALBUTEROL SULFATE HFA 108 (90 BASE) MCG/ACT IN AERS: 2.0000 | INHALATION_SPRAY | Freq: Four times a day (QID) | RESPIRATORY_TRACT | 2 refills | Status: DC | PRN

## 2022-03-11 NOTE — Patient Instructions (Addendum)
As we discussed today you are not a candidate for invasive procedures or surgery.  Any such procedures that are done are at prohibitive risk for you.    Unfortunately your lung and heart issues are too complicated and cannot be further "fine-tuned" to make you as likely to have complications from any invasive/surgical procedure.  I recommend that we start looking into a Palliative Care so that they can be involved in your care and help if you decompensate further.  Your oxygen level when you came to the clinic today was 78% on 4 L of oxygen.  This improved to 92 to 93% on 6 L of oxygen.  You will need 6 L of oxygen from now on.  We recommended admission but you declined AGAINST MEDICAL ADVICE.  We have [facility] to [facility] to get you set up for an overnight oxygen study on your regular 4 L of oxygen to make sure this is enough for you during sleep.  During the day you will have your oxygen at 6 L/min.  Continue your nebulizer treatments and your medications as they currently are.  At your request we refill your albuterol because Xopenex is not covered by your insurance.  We will see you in follow-up in 6 to 8 weeks time call sooner should any new problems arise.

## 2022-03-11 NOTE — Progress Notes (Unsigned)
Subjective:    Patient ID: [name], female    DOB: Feb 02, 1941, 81 y.o.   MRN: [id number] Patient Care Team: Jinny Sanders, MD as PCP - General (Family Medicine) [place]ique, Peter M, MD as PCP - Cardiology (Cardiology) [place], Peter M, MD (Cardiology)  Chief Complaint  Patient presents with   Follow-up   HPI Patient is very complex 81 year old former smoker who has followed previously with Dr. Baird Lyons at [facility] for COPD, chronic respiratory failure with hypoxia, OSA/failed CPAP, history of lung cancer status post XRT/chemotherapy and chronic A-fib and chronic combined systolic and diastolic heart failure and SEVERE pulmonary hypertension.  She was initially seen by me on 02 December 2021 wanting to establish at our [place] office as she is now residing in an assisted living facility in [place] ([place]).  At her first evaluation here she had to be transferred to the emergency room and was admitted for management of acute on chronic respiratory failure and decompensation of congestive failure.  Today she presents for preoperative assessment for a potential repair and stent graft of a thoracic aortic aneurysm by vascular surgery.  On presentation today the patient's oxygen saturations on 4 L nasal cannula O2 (baseline) were 78 %.  Patient had to be increased to 6 L/min  to maintain oxygen saturations above 89%. Her heart rate was consistent with atrial fibrillation with RVR averaging 100-120 bpm, blood pressure 98/58. Patient with significant tachypnea and conversational dyspnea.  She has significant lower extremity edema with Unna boots bilaterally.  It was recommended that she she consider being readmitted for management of her acute on chronic respiratory failure particularly needing more O2 to maintain saturations.  The patient declined AGAINST MEDICAL ADVICE.    We reviewed her medications, she has not been able to use her DuoNeb nebulizer due to missing the dose cups  for the nebulizer machine.  We provided her with some today.  The patient endorses increase shortness of breath over the last 6 months worsening gradually.  He has noted increasing lower extremity edema.  No chest pain.  She does not wish to be admitted for optimization of her respiratory failure and edema issues.   Review of Systems A 10 point review of systems was performed and it is as noted above otherwise negative.  Patient Active Problem List   Diagnosis Date Noted   Aneurysm of thoracic aorta ([place]) 01/12/2022   Vaginal bleeding 12/23/2021   Thrombocytopenia ([place]) 12/04/2021   Permanent atrial fibrillation ([place])    Goals of care, counseling/discussion 12/03/2021   Acute on chronic heart failure with preserved ejection fraction (HFpEF) ([place]) 12/02/2021   Chronic low back pain 12/02/2021   Obesity (BMI 30-39.9) 12/02/2021   Anxiety and depression    Osteoarthritis of left shoulder 08/30/2021   Falls 07/15/2021   Acute on chronic respiratory failure with hypoxia ([place]) 05/04/2021   Frequent UTI    Anticoagulant long-term use    Physical deconditioning 83/63/8453   Chronic systolic CHF (congestive heart failure) ([place]) 09/15/2016   Hyperlipidemia    Chronic respiratory failure with hypoxia ([place]) 12/03/2014   Multiple thyroid nodules 10/17/2012   AAA (abdominal aortic aneurysm) ([place]) 05/11/2012   Bradycardia 01/23/2012   Hypothyroid 01/21/2012   HTN (hypertension) 01/21/2012   Obstructive sleep apnea 10/16/2008   History of DVT (deep vein thrombosis) 08/19/2008   COPD (chronic obstructive pulmonary disease) ([place]) 08/19/2008   NEOPLASM, MALIGNANT, LUNG, HX OF 64/68/0321   Diastolic heart failure, NYHA class 2 ([place])  08/03/2008   Hyperbilirubinemia 2004   Social History   Tobacco Use   Smoking status: Former    Packs/day: 1.00    Years: 40.00    Total pack years: 40.00    Types: Cigarettes    Quit date: 06/23/2001    Years since quitting: 20.7   Smokeless tobacco: Never   Substance Use Topics   Alcohol use: Not Currently    Comment: once a week (glass of wine)   No Known Allergies   Current Meds  Medication Sig   acetaminophen (TYLENOL) 500 MG tablet Take 2 tablets (1,000 mg total) by mouth every 6 (six) hours as needed for fever, headache, mild pain or moderate pain (backpain).   albuterol (VENTOLIN HFA) 108 (90 Base) MCG/ACT inhaler TAKE 2 PUFFS EVERY 6 HOURS AS NEEDED FORSHORTNESS OF BREATH/WHEEZING   apixaban (ELIQUIS) 5 MG TABS tablet Take 1 tablet (5 mg total) by mouth 2 (two) times daily.   Cholecalciferol (VITAMIN D) 1000 UNITS capsule Take 1,000 Units by mouth daily.   citalopram (CELEXA) 20 MG tablet Take 20 mg by mouth daily.   digoxin (LANOXIN) 0.125 MG tablet Take 0.5 tablets (0.0625 mg total) by mouth daily.   feeding supplement (ENSURE ENLIVE / ENSURE PLUS) LIQD Take 237 mLs by mouth 2 (two) times daily between meals.   ipratropium-albuterol (DUONEB) 0.5-2.5 (3) MG/3ML SOLN Take 3 mLs by nebulization 2 (two) times daily.   levothyroxine (SYNTHROID, LEVOTHROID) 88 MCG tablet Take 88 mcg by mouth daily.   melatonin 5 MG TABS Take 5 mg by mouth at bedtime.   metoprolol tartrate (LOPRESSOR) 25 MG tablet TAKE 1/2 TABLET TWICE DAILY   morphine (MSIR) 15 MG tablet Take 0.5 tablets (7.5 mg total) by mouth every 6 (six) hours as needed.   Multiple Vitamin (MULTIVITAMIN WITH MINERALS) TABS tablet Take 1 tablet by mouth daily.   OXYGEN Inhale 4 L into the lungs daily. Patient states she wears 2L at night and 4L when up and moving   polyethylene glycol (MIRALAX / GLYCOLAX) 17 g packet Take 17 g by mouth daily as needed.   potassium chloride SA (KLOR-CON M) 20 MEQ tablet Take 20 mEq by mouth 2 (two) times daily.   vitamin B-12 (CYANOCOBALAMIN) 100 MCG tablet Take 100 mcg by mouth daily.   Immunization History  Administered Date(s) Administered   Fluad Quad(high Dose 65+) 03/15/2020   Influenza Split 03/23/2013, 03/22/2014   Influenza Whole  04/07/2011, 03/23/2012   Influenza, High Dose Seasonal PF 04/23/2016, 03/23/2017, 04/12/2018, 04/15/2019   Influenza,inj,Quad PF,6+ Mos 04/24/2015   Influenza,inj,quad, With Preservative 04/12/2018   Influenza-Unspecified 03/23/2017   PFIZER(Purple Top)SARS-COV-2 Vaccination 07/15/2019, 08/05/2019, 03/27/2020   Pneumococcal Conjugate-13 05/16/2013      Objective:   Physical Exam BP (!) 98/58 (BP Location: Left Arm, Cuff Size: Large)   Pulse (!) 107   Temp 98.2 F (36.8 C) (Oral)   SpO2 (!) 88%  GENERAL: Obese elderly woman, mild to moderate conversational dyspnea.  Presents in transport chair. HEAD: Normocephalic, atraumatic.  EYES: Pupils equal, round, reactive to light.  No scleral icterus.  MOUTH: Poor dentition, oral mucosa moist. NECK: Supple. No thyromegaly. Trachea midline.  Neck veins full.  No adenopathy. PULMONARY: Good air entry bilaterally.  Faint crackles at the bases.  No wheezes. CARDIOVASCULAR: S1 and S2.  Irregular rate and rhythm with rapid ventricular response grade 2/6 systolic ejection murmur left sternal border. ABDOMEN: Obese otherwise benign. MUSCULOSKELETAL: No joint deformity, no clubbing, +2-3 edema, indurated, nonpitting, Unna boots  in place.  Significant kyphosis. NEUROLOGIC: No overt localizing sign.  Gait not tested. SKIN: Intact,warm,dry.  Chronic stasis changes bilaterally. PSYCH: Mood and behavior normal.  ABG 12/06/2021: pH7.42/PaCO2 64/PaO2 131, bicarb 41.5 acid base excess 14  Echocardiogram 12/03/2021: LVEF 55 to 60%, mild LVH, RV systolic function reduced.  RV size moderately enlarged, severely elevated pulmonary artery systolic pressure, left atrial size moderately dilated, right atrial size moderately dilated, mitral valve regurgitation, mild.  Tricuspid valve regurgitation moderate to severe.  Dilated inferior vena cava suggesting right atrial pressure of 15 mmHg.    Assessment & Plan:     ICD-10-CM   1. Acute on chronic respiratory  failure with hypoxia and hypercapnia (HCC)  J96.21 Ambulatory referral to Hospice   J96.22 Pulse oximetry, overnight    Ambulatory Referral for DME    Ambulatory Referral for DME   Patient will need increase of baseline O2 to 6 L/min Continue O2 at 4 L/min nocturnally ONOX on 4 L/min nocturnally Etiology is from severe pulmonary hyperte    2. Severe pulmonary hypertension (HCC)  I27.20    Severe hypoxia due to severe pulmonary hypertension Pulmonary hypertension due to chronic hypoxic vasoconstriction Increased O2 supplementation as noted above    3. COPD mixed type ([place])  J44.9 Ambulatory Referral for DME   Not well compensated Provided her with those cups for her nebulizer Instructed to use DuoNeb 4 times a day    4. Atrial fibrillation with RVR (HCC)  I48.91    This issue adds complexity to her management    5. Pre-operative respiratory examination  Z01.811    Patient is a PROHIBITIVE risk for invasive procedures Her current conditions cannot be further optimized     Orders Placed This Encounter  Procedures   Ambulatory referral to Hospice    Referral Priority:   Routine    Referral Type:   Consultation    Referral Reason:   Specialty Services Required    Requested Specialty:   Hospice Services    Number of Visits Requested:   1   Ambulatory Referral for DME    Referral Priority:   Routine    Referral Type:   Durable Medical Equipment Purchase    Number of Visits Requested:   1   Ambulatory Referral for DME    Referral Priority:   Routine    Referral Type:   Durable Medical Equipment Purchase    Number of Visits Requested:   1   Pulse oximetry, overnight    Standing Status:   Future    Standing Expiration Date:   03/12/2023    Scheduling Instructions:     Needs ONO on 4lpm           Adapt   Meds ordered this encounter  Medications   albuterol (VENTOLIN HFA) 108 (90 Base) MCG/ACT inhaler    Sig: Inhale 2 puffs into the lungs every 6 (six) hours as needed for  wheezing or shortness of breath.    Dispense:  8 g    Refill:  2   I discussed with the patient that she has severe pulmonary hypertension, severe COPD with hypercarbic and hypoxic respiratory failure.  The patient is an PROHIBITIVE risk for invasive procedures that would require even monitored anesthesia care.  The patient has significant CO2 retention at baseline and sedative medications would make this worse.  In addition she has severe hypoxemia and has required increased liter flow to maintain saturations at 89% or better.  We discussed end-of-life  issues.  I recommend that the patient be evaluated by palliative care.  If this patient were to require mechanical ventilation the likelihood of weaning off the ventilator would be nil.  The patient does not feel that she would want to have this intervention performed.  We will see the patient in follow-up in 6 to 8 weeks time she is to call sooner should any new problems arise.  Total visit time 55 minutes.   Renold Don, MD Advanced Bronchoscopy PCCM [facility]    *This note was dictated using voice recognition software/Dragon.  Despite best efforts to proofread, errors can occur which can change the meaning. Any transcriptional errors that result from this process are unintentional and may not be fully corrected at the time of dictation.

## 2022-03-12 ENCOUNTER — Encounter: Payer: Self-pay | Admitting: Pulmonary Disease

## 2022-03-13 DIAGNOSIS — E785 Hyperlipidemia, unspecified: Secondary | ICD-10-CM | POA: Diagnosis not present

## 2022-03-13 DIAGNOSIS — E039 Hypothyroidism, unspecified: Secondary | ICD-10-CM | POA: Diagnosis not present

## 2022-03-13 DIAGNOSIS — M1990 Unspecified osteoarthritis, unspecified site: Secondary | ICD-10-CM | POA: Diagnosis not present

## 2022-03-13 DIAGNOSIS — I4891 Unspecified atrial fibrillation: Secondary | ICD-10-CM | POA: Diagnosis not present

## 2022-03-13 DIAGNOSIS — K219 Gastro-esophageal reflux disease without esophagitis: Secondary | ICD-10-CM | POA: Diagnosis not present

## 2022-03-13 DIAGNOSIS — J449 Chronic obstructive pulmonary disease, unspecified: Secondary | ICD-10-CM | POA: Diagnosis not present

## 2022-03-13 DIAGNOSIS — F32A Depression, unspecified: Secondary | ICD-10-CM | POA: Diagnosis not present

## 2022-03-13 DIAGNOSIS — I714 Abdominal aortic aneurysm, without rupture, unspecified: Secondary | ICD-10-CM | POA: Diagnosis not present

## 2022-03-13 DIAGNOSIS — I110 Hypertensive heart disease with heart failure: Secondary | ICD-10-CM | POA: Diagnosis not present

## 2022-03-13 DIAGNOSIS — I509 Heart failure, unspecified: Secondary | ICD-10-CM | POA: Diagnosis not present

## 2022-03-14 DIAGNOSIS — J449 Chronic obstructive pulmonary disease, unspecified: Secondary | ICD-10-CM | POA: Diagnosis not present

## 2022-03-14 DIAGNOSIS — I714 Abdominal aortic aneurysm, without rupture, unspecified: Secondary | ICD-10-CM | POA: Diagnosis not present

## 2022-03-14 DIAGNOSIS — K219 Gastro-esophageal reflux disease without esophagitis: Secondary | ICD-10-CM | POA: Diagnosis not present

## 2022-03-14 DIAGNOSIS — E785 Hyperlipidemia, unspecified: Secondary | ICD-10-CM | POA: Diagnosis not present

## 2022-03-14 DIAGNOSIS — I110 Hypertensive heart disease with heart failure: Secondary | ICD-10-CM | POA: Diagnosis not present

## 2022-03-14 DIAGNOSIS — I509 Heart failure, unspecified: Secondary | ICD-10-CM | POA: Diagnosis not present

## 2022-03-15 DIAGNOSIS — Z23 Encounter for immunization: Secondary | ICD-10-CM | POA: Diagnosis not present

## 2022-03-17 DIAGNOSIS — I509 Heart failure, unspecified: Secondary | ICD-10-CM | POA: Diagnosis not present

## 2022-03-17 DIAGNOSIS — J449 Chronic obstructive pulmonary disease, unspecified: Secondary | ICD-10-CM | POA: Diagnosis not present

## 2022-03-17 DIAGNOSIS — I714 Abdominal aortic aneurysm, without rupture, unspecified: Secondary | ICD-10-CM | POA: Diagnosis not present

## 2022-03-17 DIAGNOSIS — K219 Gastro-esophageal reflux disease without esophagitis: Secondary | ICD-10-CM | POA: Diagnosis not present

## 2022-03-17 DIAGNOSIS — E785 Hyperlipidemia, unspecified: Secondary | ICD-10-CM | POA: Diagnosis not present

## 2022-03-17 DIAGNOSIS — I110 Hypertensive heart disease with heart failure: Secondary | ICD-10-CM | POA: Diagnosis not present

## 2022-03-18 DIAGNOSIS — I110 Hypertensive heart disease with heart failure: Secondary | ICD-10-CM | POA: Diagnosis not present

## 2022-03-18 DIAGNOSIS — J449 Chronic obstructive pulmonary disease, unspecified: Secondary | ICD-10-CM | POA: Diagnosis not present

## 2022-03-18 DIAGNOSIS — I714 Abdominal aortic aneurysm, without rupture, unspecified: Secondary | ICD-10-CM | POA: Diagnosis not present

## 2022-03-18 DIAGNOSIS — E785 Hyperlipidemia, unspecified: Secondary | ICD-10-CM | POA: Diagnosis not present

## 2022-03-18 DIAGNOSIS — K219 Gastro-esophageal reflux disease without esophagitis: Secondary | ICD-10-CM | POA: Diagnosis not present

## 2022-03-18 DIAGNOSIS — I509 Heart failure, unspecified: Secondary | ICD-10-CM | POA: Diagnosis not present

## 2022-03-19 DIAGNOSIS — I714 Abdominal aortic aneurysm, without rupture, unspecified: Secondary | ICD-10-CM | POA: Diagnosis not present

## 2022-03-19 DIAGNOSIS — J449 Chronic obstructive pulmonary disease, unspecified: Secondary | ICD-10-CM | POA: Diagnosis not present

## 2022-03-19 DIAGNOSIS — K219 Gastro-esophageal reflux disease without esophagitis: Secondary | ICD-10-CM | POA: Diagnosis not present

## 2022-03-19 DIAGNOSIS — E785 Hyperlipidemia, unspecified: Secondary | ICD-10-CM | POA: Diagnosis not present

## 2022-03-19 DIAGNOSIS — I110 Hypertensive heart disease with heart failure: Secondary | ICD-10-CM | POA: Diagnosis not present

## 2022-03-19 DIAGNOSIS — I509 Heart failure, unspecified: Secondary | ICD-10-CM | POA: Diagnosis not present

## 2022-03-20 DIAGNOSIS — I272 Pulmonary hypertension, unspecified: Secondary | ICD-10-CM | POA: Diagnosis not present

## 2022-03-20 DIAGNOSIS — I714 Abdominal aortic aneurysm, without rupture, unspecified: Secondary | ICD-10-CM | POA: Diagnosis not present

## 2022-03-20 DIAGNOSIS — I480 Paroxysmal atrial fibrillation: Secondary | ICD-10-CM | POA: Diagnosis not present

## 2022-03-20 DIAGNOSIS — D692 Other nonthrombocytopenic purpura: Secondary | ICD-10-CM | POA: Diagnosis not present

## 2022-03-20 DIAGNOSIS — R269 Unspecified abnormalities of gait and mobility: Secondary | ICD-10-CM | POA: Diagnosis not present

## 2022-03-20 DIAGNOSIS — R600 Localized edema: Secondary | ICD-10-CM | POA: Diagnosis not present

## 2022-03-20 DIAGNOSIS — K5900 Constipation, unspecified: Secondary | ICD-10-CM | POA: Diagnosis not present

## 2022-03-20 DIAGNOSIS — J9621 Acute and chronic respiratory failure with hypoxia: Secondary | ICD-10-CM | POA: Diagnosis not present

## 2022-03-20 DIAGNOSIS — E785 Hyperlipidemia, unspecified: Secondary | ICD-10-CM | POA: Diagnosis not present

## 2022-03-20 DIAGNOSIS — I509 Heart failure, unspecified: Secondary | ICD-10-CM | POA: Diagnosis not present

## 2022-03-20 DIAGNOSIS — J449 Chronic obstructive pulmonary disease, unspecified: Secondary | ICD-10-CM | POA: Diagnosis not present

## 2022-03-20 DIAGNOSIS — I110 Hypertensive heart disease with heart failure: Secondary | ICD-10-CM | POA: Diagnosis not present

## 2022-03-20 DIAGNOSIS — N182 Chronic kidney disease, stage 2 (mild): Secondary | ICD-10-CM | POA: Diagnosis not present

## 2022-03-20 DIAGNOSIS — I5042 Chronic combined systolic (congestive) and diastolic (congestive) heart failure: Secondary | ICD-10-CM | POA: Diagnosis not present

## 2022-03-20 DIAGNOSIS — K219 Gastro-esophageal reflux disease without esophagitis: Secondary | ICD-10-CM | POA: Diagnosis not present

## 2022-03-23 DIAGNOSIS — E785 Hyperlipidemia, unspecified: Secondary | ICD-10-CM | POA: Diagnosis not present

## 2022-03-23 DIAGNOSIS — I4891 Unspecified atrial fibrillation: Secondary | ICD-10-CM | POA: Diagnosis not present

## 2022-03-23 DIAGNOSIS — K219 Gastro-esophageal reflux disease without esophagitis: Secondary | ICD-10-CM | POA: Diagnosis not present

## 2022-03-23 DIAGNOSIS — E039 Hypothyroidism, unspecified: Secondary | ICD-10-CM | POA: Diagnosis not present

## 2022-03-23 DIAGNOSIS — I110 Hypertensive heart disease with heart failure: Secondary | ICD-10-CM | POA: Diagnosis not present

## 2022-03-23 DIAGNOSIS — I509 Heart failure, unspecified: Secondary | ICD-10-CM | POA: Diagnosis not present

## 2022-03-23 DIAGNOSIS — I714 Abdominal aortic aneurysm, without rupture, unspecified: Secondary | ICD-10-CM | POA: Diagnosis not present

## 2022-03-23 DIAGNOSIS — M1990 Unspecified osteoarthritis, unspecified site: Secondary | ICD-10-CM | POA: Diagnosis not present

## 2022-03-23 DIAGNOSIS — F32A Depression, unspecified: Secondary | ICD-10-CM | POA: Diagnosis not present

## 2022-03-23 DIAGNOSIS — J449 Chronic obstructive pulmonary disease, unspecified: Secondary | ICD-10-CM | POA: Diagnosis not present

## 2022-03-26 DIAGNOSIS — J449 Chronic obstructive pulmonary disease, unspecified: Secondary | ICD-10-CM | POA: Diagnosis not present

## 2022-03-26 DIAGNOSIS — I714 Abdominal aortic aneurysm, without rupture, unspecified: Secondary | ICD-10-CM | POA: Diagnosis not present

## 2022-03-26 DIAGNOSIS — I110 Hypertensive heart disease with heart failure: Secondary | ICD-10-CM | POA: Diagnosis not present

## 2022-03-26 DIAGNOSIS — I509 Heart failure, unspecified: Secondary | ICD-10-CM | POA: Diagnosis not present

## 2022-03-26 DIAGNOSIS — E785 Hyperlipidemia, unspecified: Secondary | ICD-10-CM | POA: Diagnosis not present

## 2022-03-26 DIAGNOSIS — K219 Gastro-esophageal reflux disease without esophagitis: Secondary | ICD-10-CM | POA: Diagnosis not present

## 2022-03-28 ENCOUNTER — Ambulatory Visit: Payer: Medicare Other | Admitting: Physician Assistant

## 2022-03-31 DIAGNOSIS — I110 Hypertensive heart disease with heart failure: Secondary | ICD-10-CM | POA: Diagnosis not present

## 2022-03-31 DIAGNOSIS — I714 Abdominal aortic aneurysm, without rupture, unspecified: Secondary | ICD-10-CM | POA: Diagnosis not present

## 2022-03-31 DIAGNOSIS — E785 Hyperlipidemia, unspecified: Secondary | ICD-10-CM | POA: Diagnosis not present

## 2022-03-31 DIAGNOSIS — J449 Chronic obstructive pulmonary disease, unspecified: Secondary | ICD-10-CM | POA: Diagnosis not present

## 2022-03-31 DIAGNOSIS — I509 Heart failure, unspecified: Secondary | ICD-10-CM | POA: Diagnosis not present

## 2022-03-31 DIAGNOSIS — K219 Gastro-esophageal reflux disease without esophagitis: Secondary | ICD-10-CM | POA: Diagnosis not present

## 2022-04-02 ENCOUNTER — Emergency Department

## 2022-04-02 ENCOUNTER — Inpatient Hospital Stay
Admission: EM | Admit: 2022-04-02 | Discharge: 2022-04-18 | DRG: 314 | Disposition: A | Discharge status: Hospice | Attending: Internal Medicine | Admitting: Internal Medicine

## 2022-04-02 ENCOUNTER — Other Ambulatory Visit: Payer: Self-pay

## 2022-04-02 ENCOUNTER — Inpatient Hospital Stay

## 2022-04-02 DIAGNOSIS — R627 Adult failure to thrive: Secondary | ICD-10-CM

## 2022-04-02 DIAGNOSIS — Z833 Family history of diabetes mellitus: Secondary | ICD-10-CM

## 2022-04-02 DIAGNOSIS — E875 Hyperkalemia: Secondary | ICD-10-CM | POA: Diagnosis present

## 2022-04-02 DIAGNOSIS — N179 Acute kidney failure, unspecified: Secondary | ICD-10-CM | POA: Diagnosis not present

## 2022-04-02 DIAGNOSIS — D696 Thrombocytopenia, unspecified: Secondary | ICD-10-CM | POA: Diagnosis not present

## 2022-04-02 DIAGNOSIS — I7123 Aneurysm of the descending thoracic aorta, without rupture: Secondary | ICD-10-CM | POA: Diagnosis present

## 2022-04-02 DIAGNOSIS — J449 Chronic obstructive pulmonary disease, unspecified: Secondary | ICD-10-CM | POA: Diagnosis not present

## 2022-04-02 DIAGNOSIS — D692 Other nonthrombocytopenic purpura: Secondary | ICD-10-CM | POA: Diagnosis not present

## 2022-04-02 DIAGNOSIS — I272 Pulmonary hypertension, unspecified: Principal | ICD-10-CM | POA: Diagnosis present

## 2022-04-02 DIAGNOSIS — Z6841 Body Mass Index (BMI) 40.0 and over, adult: Secondary | ICD-10-CM | POA: Diagnosis not present

## 2022-04-02 DIAGNOSIS — Z86718 Personal history of other venous thrombosis and embolism: Secondary | ICD-10-CM

## 2022-04-02 DIAGNOSIS — E890 Postprocedural hypothyroidism: Secondary | ICD-10-CM | POA: Diagnosis present

## 2022-04-02 DIAGNOSIS — I2489 Other forms of acute ischemic heart disease: Secondary | ICD-10-CM | POA: Diagnosis present

## 2022-04-02 DIAGNOSIS — I5033 Acute on chronic diastolic (congestive) heart failure: Secondary | ICD-10-CM | POA: Diagnosis not present

## 2022-04-02 DIAGNOSIS — M545 Low back pain, unspecified: Secondary | ICD-10-CM | POA: Diagnosis present

## 2022-04-02 DIAGNOSIS — Z9981 Dependence on supplemental oxygen: Secondary | ICD-10-CM

## 2022-04-02 DIAGNOSIS — E785 Hyperlipidemia, unspecified: Secondary | ICD-10-CM | POA: Diagnosis present

## 2022-04-02 DIAGNOSIS — J9622 Acute and chronic respiratory failure with hypercapnia: Secondary | ICD-10-CM | POA: Diagnosis not present

## 2022-04-02 DIAGNOSIS — R29898 Other symptoms and signs involving the musculoskeletal system: Secondary | ICD-10-CM | POA: Diagnosis not present

## 2022-04-02 DIAGNOSIS — J9621 Acute and chronic respiratory failure with hypoxia: Secondary | ICD-10-CM | POA: Diagnosis not present

## 2022-04-02 DIAGNOSIS — F32A Depression, unspecified: Secondary | ICD-10-CM | POA: Diagnosis present

## 2022-04-02 DIAGNOSIS — Z9889 Other specified postprocedural states: Secondary | ICD-10-CM

## 2022-04-02 DIAGNOSIS — I714 Abdominal aortic aneurysm, without rupture, unspecified: Secondary | ICD-10-CM | POA: Diagnosis not present

## 2022-04-02 DIAGNOSIS — Z96652 Presence of left artificial knee joint: Secondary | ICD-10-CM | POA: Diagnosis present

## 2022-04-02 DIAGNOSIS — Z7401 Bed confinement status: Secondary | ICD-10-CM | POA: Diagnosis not present

## 2022-04-02 DIAGNOSIS — Z515 Encounter for palliative care: Secondary | ICD-10-CM | POA: Diagnosis not present

## 2022-04-02 DIAGNOSIS — I739 Peripheral vascular disease, unspecified: Secondary | ICD-10-CM | POA: Diagnosis present

## 2022-04-02 DIAGNOSIS — F419 Anxiety disorder, unspecified: Secondary | ICD-10-CM | POA: Diagnosis present

## 2022-04-02 DIAGNOSIS — J90 Pleural effusion, not elsewhere classified: Secondary | ICD-10-CM | POA: Diagnosis present

## 2022-04-02 DIAGNOSIS — Z66 Do not resuscitate: Secondary | ICD-10-CM | POA: Diagnosis not present

## 2022-04-02 DIAGNOSIS — E039 Hypothyroidism, unspecified: Secondary | ICD-10-CM | POA: Diagnosis not present

## 2022-04-02 DIAGNOSIS — I5043 Acute on chronic combined systolic (congestive) and diastolic (congestive) heart failure: Secondary | ICD-10-CM | POA: Diagnosis not present

## 2022-04-02 DIAGNOSIS — Z923 Personal history of irradiation: Secondary | ICD-10-CM

## 2022-04-02 DIAGNOSIS — Z79899 Other long term (current) drug therapy: Secondary | ICD-10-CM

## 2022-04-02 DIAGNOSIS — J441 Chronic obstructive pulmonary disease with (acute) exacerbation: Secondary | ICD-10-CM | POA: Diagnosis present

## 2022-04-02 DIAGNOSIS — E876 Hypokalemia: Secondary | ICD-10-CM | POA: Diagnosis not present

## 2022-04-02 DIAGNOSIS — Z1152 Encounter for screening for COVID-19: Secondary | ICD-10-CM | POA: Diagnosis not present

## 2022-04-02 DIAGNOSIS — Z7901 Long term (current) use of anticoagulants: Secondary | ICD-10-CM

## 2022-04-02 DIAGNOSIS — J9811 Atelectasis: Secondary | ICD-10-CM | POA: Diagnosis present

## 2022-04-02 DIAGNOSIS — I110 Hypertensive heart disease with heart failure: Secondary | ICD-10-CM | POA: Diagnosis present

## 2022-04-02 DIAGNOSIS — I10 Essential (primary) hypertension: Secondary | ICD-10-CM | POA: Diagnosis present

## 2022-04-02 DIAGNOSIS — G4733 Obstructive sleep apnea (adult) (pediatric): Secondary | ICD-10-CM | POA: Diagnosis present

## 2022-04-02 DIAGNOSIS — R5381 Other malaise: Secondary | ICD-10-CM | POA: Diagnosis present

## 2022-04-02 DIAGNOSIS — I4891 Unspecified atrial fibrillation: Secondary | ICD-10-CM | POA: Diagnosis not present

## 2022-04-02 DIAGNOSIS — I4892 Unspecified atrial flutter: Secondary | ICD-10-CM | POA: Diagnosis not present

## 2022-04-02 DIAGNOSIS — Z9221 Personal history of antineoplastic chemotherapy: Secondary | ICD-10-CM

## 2022-04-02 DIAGNOSIS — R0902 Hypoxemia: Secondary | ICD-10-CM | POA: Diagnosis not present

## 2022-04-02 DIAGNOSIS — Z85118 Personal history of other malignant neoplasm of bronchus and lung: Secondary | ICD-10-CM

## 2022-04-02 DIAGNOSIS — R600 Localized edema: Secondary | ICD-10-CM | POA: Diagnosis not present

## 2022-04-02 DIAGNOSIS — Z87440 Personal history of urinary (tract) infections: Secondary | ICD-10-CM

## 2022-04-02 DIAGNOSIS — Z8249 Family history of ischemic heart disease and other diseases of the circulatory system: Secondary | ICD-10-CM

## 2022-04-02 DIAGNOSIS — G8929 Other chronic pain: Secondary | ICD-10-CM | POA: Diagnosis present

## 2022-04-02 DIAGNOSIS — K219 Gastro-esophageal reflux disease without esophagitis: Secondary | ICD-10-CM | POA: Diagnosis not present

## 2022-04-02 DIAGNOSIS — R001 Bradycardia, unspecified: Secondary | ICD-10-CM | POA: Diagnosis not present

## 2022-04-02 DIAGNOSIS — Z7189 Other specified counseling: Secondary | ICD-10-CM | POA: Diagnosis not present

## 2022-04-02 DIAGNOSIS — I509 Heart failure, unspecified: Secondary | ICD-10-CM | POA: Diagnosis not present

## 2022-04-02 DIAGNOSIS — Z79890 Hormone replacement therapy (postmenopausal): Secondary | ICD-10-CM

## 2022-04-02 DIAGNOSIS — I4821 Permanent atrial fibrillation: Secondary | ICD-10-CM | POA: Diagnosis not present

## 2022-04-02 DIAGNOSIS — Z87891 Personal history of nicotine dependence: Secondary | ICD-10-CM

## 2022-04-02 DIAGNOSIS — I482 Chronic atrial fibrillation, unspecified: Secondary | ICD-10-CM

## 2022-04-02 DIAGNOSIS — Z8679 Personal history of other diseases of the circulatory system: Secondary | ICD-10-CM

## 2022-04-02 DIAGNOSIS — I5022 Chronic systolic (congestive) heart failure: Secondary | ICD-10-CM | POA: Diagnosis present

## 2022-04-02 LAB — CBC
HCT: 51.1 % — ABNORMAL HIGH (ref 36.0–46.0)
Hemoglobin: 16 g/dL — ABNORMAL HIGH (ref 12.0–15.0)
MCH: 34.2 pg — ABNORMAL HIGH (ref 26.0–34.0)
MCHC: 31.3 g/dL (ref 30.0–36.0)
MCV: 109.2 fL — ABNORMAL HIGH (ref 80.0–100.0)
Platelets: 107 10*3/uL — ABNORMAL LOW (ref 150–400)
RBC: 4.68 MIL/uL (ref 3.87–5.11)
RDW: 14.2 % (ref 11.5–15.5)
WBC: 7 10*3/uL (ref 4.0–10.5)
nRBC: 0 % (ref 0.0–0.2)

## 2022-04-02 LAB — BASIC METABOLIC PANEL
Anion gap: 11 (ref 5–15)
BUN: 29 mg/dL — ABNORMAL HIGH (ref 8–23)
CO2: 31 mmol/L (ref 22–32)
Calcium: 9.4 mg/dL (ref 8.9–10.3)
Chloride: 95 mmol/L — ABNORMAL LOW (ref 98–111)
Creatinine, Ser: 1.55 mg/dL — ABNORMAL HIGH (ref 0.44–1.00)
GFR, Estimated: 33 mL/min — ABNORMAL LOW (ref >60)
Glucose, Bld: 115 mg/dL — ABNORMAL HIGH (ref 70–99)
Potassium: 5.2 mmol/L — ABNORMAL HIGH (ref 3.5–5.1)
Sodium: 137 mmol/L (ref 135–145)

## 2022-04-02 LAB — TROPONIN I (HIGH SENSITIVITY)
Troponin I (High Sensitivity): 56 ng/L — ABNORMAL HIGH (ref <18)
Troponin I (High Sensitivity): 42 ng/L — ABNORMAL HIGH (ref <18)

## 2022-04-02 LAB — BRAIN NATRIURETIC PEPTIDE: B Natriuretic Peptide: 602.7 pg/mL — ABNORMAL HIGH (ref 0.0–100.0)

## 2022-04-02 LAB — LACTIC ACID, PLASMA
Lactic Acid, Venous: 3.2 mmol/L (ref 0.5–1.9)
Lactic Acid, Venous: 5.6 mmol/L (ref 0.5–1.9)

## 2022-04-02 MED ORDER — IPRATROPIUM-ALBUTEROL 0.5-2.5 (3) MG/3ML IN SOLN
3.0000 mL | Freq: Once | RESPIRATORY_TRACT | Status: AC
  Administered 2022-04-02: 3 mL via RESPIRATORY_TRACT
  Filled 2022-04-02: qty 3

## 2022-04-02 MED ORDER — POLYETHYLENE GLYCOL 3350 17 G PO PACK
17.0000 g | PACK | Freq: Every day | ORAL | Status: DC | PRN
  Administered 2022-04-10: 17 g via ORAL
  Filled 2022-04-02 (×2): qty 1

## 2022-04-02 MED ORDER — PANTOPRAZOLE SODIUM 40 MG PO TBEC
40.0000 mg | DELAYED_RELEASE_TABLET | Freq: Every day | ORAL | Status: DC
  Administered 2022-04-03 – 2022-04-18 (×16): 40 mg via ORAL
  Filled 2022-04-02 (×16): qty 1

## 2022-04-02 MED ORDER — SODIUM CHLORIDE 0.9 % IV SOLN
500.0000 mg | INTRAVENOUS | Status: DC
  Administered 2022-04-02: 500 mg via INTRAVENOUS
  Filled 2022-04-02: qty 5

## 2022-04-02 MED ORDER — IOHEXOL 350 MG/ML SOLN
50.0000 mL | Freq: Once | INTRAVENOUS | Status: AC | PRN
  Administered 2022-04-02: 50 mL via INTRAVENOUS

## 2022-04-02 MED ORDER — PREDNISONE 20 MG PO TABS
40.0000 mg | ORAL_TABLET | Freq: Every day | ORAL | Status: DC
  Administered 2022-04-03 – 2022-04-04 (×2): 40 mg via ORAL
  Filled 2022-04-02 (×2): qty 2

## 2022-04-02 MED ORDER — SODIUM CHLORIDE 0.9 % IV BOLUS
1000.0000 mL | Freq: Once | INTRAVENOUS | Status: AC
  Administered 2022-04-02: 1000 mL via INTRAVENOUS

## 2022-04-02 MED ORDER — LEVOTHYROXINE SODIUM 88 MCG PO TABS
88.0000 ug | ORAL_TABLET | Freq: Every day | ORAL | Status: DC
  Administered 2022-04-03 – 2022-04-18 (×16): 88 ug via ORAL
  Filled 2022-04-02 (×17): qty 1

## 2022-04-02 MED ORDER — CITALOPRAM HYDROBROMIDE 20 MG PO TABS
20.0000 mg | ORAL_TABLET | Freq: Every day | ORAL | Status: DC
  Administered 2022-04-03 – 2022-04-18 (×16): 20 mg via ORAL
  Filled 2022-04-02 (×16): qty 1

## 2022-04-02 MED ORDER — DIGOXIN 125 MCG PO TABS
0.0625 mg | ORAL_TABLET | Freq: Every day | ORAL | Status: DC
  Administered 2022-04-03 – 2022-04-18 (×16): 0.0625 mg via ORAL
  Filled 2022-04-02 (×16): qty 0.5

## 2022-04-02 MED ORDER — TORSEMIDE 20 MG PO TABS: 40.0000 mg | ORAL_TABLET | Freq: Two times a day (BID) | ORAL | Status: DC

## 2022-04-02 MED ORDER — APIXABAN 2.5 MG PO TABS
2.5000 mg | ORAL_TABLET | Freq: Two times a day (BID) | ORAL | Status: DC
  Administered 2022-04-02 – 2022-04-18 (×32): 2.5 mg via ORAL
  Filled 2022-04-02 (×33): qty 1

## 2022-04-02 MED ORDER — GABAPENTIN 100 MG PO CAPS
100.0000 mg | ORAL_CAPSULE | Freq: Three times a day (TID) | ORAL | Status: DC
  Administered 2022-04-02 – 2022-04-18 (×48): 100 mg via ORAL
  Filled 2022-04-02 (×48): qty 1

## 2022-04-02 MED ORDER — POTASSIUM CHLORIDE CRYS ER 20 MEQ PO TBCR: 20.0000 meq | EXTENDED_RELEASE_TABLET | Freq: Two times a day (BID) | ORAL | Status: DC

## 2022-04-02 MED ORDER — METOPROLOL TARTRATE 25 MG PO TABS
12.5000 mg | ORAL_TABLET | Freq: Two times a day (BID) | ORAL | Status: DC
  Administered 2022-04-02 – 2022-04-04 (×4): 12.5 mg via ORAL
  Filled 2022-04-02 (×4): qty 1

## 2022-04-02 MED ORDER — IPRATROPIUM-ALBUTEROL 0.5-2.5 (3) MG/3ML IN SOLN
3.0000 mL | Freq: Four times a day (QID) | RESPIRATORY_TRACT | Status: DC
  Administered 2022-04-02 – 2022-04-04 (×8): 3 mL via RESPIRATORY_TRACT
  Filled 2022-04-02 (×9): qty 3

## 2022-04-02 MED ORDER — ALBUTEROL SULFATE (2.5 MG/3ML) 0.083% IN NEBU
5.0000 mg | INHALATION_SOLUTION | Freq: Once | RESPIRATORY_TRACT | Status: AC
  Administered 2022-04-02: 5 mg via RESPIRATORY_TRACT
  Filled 2022-04-02: qty 6

## 2022-04-02 MED ORDER — MORPHINE SULFATE 15 MG PO TABS
7.5000 mg | ORAL_TABLET | Freq: Four times a day (QID) | ORAL | Status: DC | PRN
  Administered 2022-04-09 – 2022-04-17 (×3): 7.5 mg via ORAL
  Filled 2022-04-02 (×3): qty 1

## 2022-04-02 MED ORDER — LACTATED RINGERS IV SOLN: INTRAVENOUS | Status: DC

## 2022-04-02 MED ORDER — METHYLPREDNISOLONE SODIUM SUCC 125 MG IJ SOLR
125.0000 mg | INTRAMUSCULAR | Status: AC
  Administered 2022-04-02: 125 mg via INTRAVENOUS
  Filled 2022-04-02: qty 2

## 2022-04-02 MED ORDER — SODIUM CHLORIDE 0.9 % IV SOLN
2.0000 g | INTRAVENOUS | Status: DC
  Administered 2022-04-02: 2 g via INTRAVENOUS
  Filled 2022-04-02: qty 20

## 2022-04-02 MED ORDER — LACTATED RINGERS IV SOLN: INTRAVENOUS | Status: AC

## 2022-04-02 MED ORDER — SODIUM ZIRCONIUM CYCLOSILICATE 10 G PO PACK
10.0000 g | PACK | Freq: Once | ORAL | Status: AC
  Administered 2022-04-02: 10 g via ORAL
  Filled 2022-04-02: qty 1

## 2022-04-02 NOTE — ED Notes (Signed)
BP soft, will assess/ monitor

## 2022-04-02 NOTE — Assessment & Plan Note (Addendum)
Status postthoracentesis on 10/12 removing 500 mL of fluid

## 2022-04-02 NOTE — ED Notes (Signed)
Exacerbated after getting up to [facility] off O2, returned to bed, monitor and O2

## 2022-04-02 NOTE — ED Notes (Signed)
Placed on NRB for low SPO2 on 6L [place]. Trending down. No distress. RT notified, will see.

## 2022-04-02 NOTE — ED Notes (Signed)
Back from CT/ [place], no changes, denies pain, or nausea. Endorses some minimal sob. Alert, NAD, calm, interactive.

## 2022-04-02 NOTE — Assessment & Plan Note (Deleted)
Hyperkalemia in the setting of AKI without EKG changes  - Lokelma 10 g one-time dose - Repeat BMP in the a.m.

## 2022-04-02 NOTE — H&P (Addendum)
History and Physical    Patient: [name] LSL:[id number] DOB: 25-Nov-1940 DOA: 04/02/2022 DOS: the patient was seen and examined on 04/02/2022 PCP: Jinny Sanders, MD  Patient coming from: ALF/ILF  Chief Complaint:  Chief Complaint  Patient presents with   Shortness of Breath   HPI: [name] is a 81 y.o. female with medical history significant of severe COPD c/b chronic hypoxic and hypercapnic respiratory failure on 4 L [place] O2, severe pulmonary hypertension, systolic CHF, atrial fibrillation on Eliquis, peripheral artery disease, NSCLC s/p XRT/chemotherapy in remission, OSA not on CPAP, who presents to the ED with shortness of breath.  [name] states she has been experiencing a gradual decline over the last 6 months and has noticed she has become increasingly weak due to sedentary lifestyle at her ALF.  During this time, she has experienced increased dyspnea on exertion.  She has noticed a marked worsening in her work of breathing over the last week ago.  She endorses increased shortness of breath only, denying any cough or sputum production.  She denies any fever, chills, congestion, recent nausea or vomiting, diarrhea, abdominal pain, urinary or bowel changes, or focal weakness.  Per chart review, patient followed up with Dr. [name] at [facility] pulmonology on 9/19.  At that time, she was saturating at 78% on 4 L nasal cannula O2.  She was increased to 6 L at that time with improvement in saturation to 89% she was in atrial fibrillation with RVR with blood pressure of 95/58.  At that time, she was suspected to be experiencing acute on chronic CHF.  However patient declined evaluation in the ED.  It was recommended at that time that her baseline O2 be increased to 6 L; unclear if this change was made at patient's ALF.  ED course: On arrival to the ED, patient was saturating at 90% on 6 L nasal cannula.  She was normotensive at 104/73 with heart rate of 99 and respiratory rate  of 19.  Initial blood work remarkable for AKI with creatinine of 1.55 and potassium of 5.2.  CBC remarkable for hemoglobin of 16.0.  Due to concern for PE, CTA was pursued that was negative for PE.  Bilateral Dopplers negative for DVT.  She was started on broad-spectrum antibiotics, DuoNebs and Solu-Medrol.  TRH contacted for admission for acute on chronic hypoxic respiratory failure.  Review of Systems: As mentioned in the history of present illness. All other systems reviewed and are negative.  Past Medical History:  Diagnosis Date   AAA (abdominal aortic aneurysm) (HCC)    Anticoagulant long-term use    Failed on Coumadin. On Xarelto   Anxiety    Anxiety and depression    Atrial fib/flutter, transient June 2012   Chronic lower back pain    Chronic respiratory failure with hypoxia ([place]) 12/03/2014   COPD (chronic obstructive pulmonary disease) ([place])    DVT (deep venous thrombosis) ([place]) 2004   BLE   Esophageal dysmotility    Exertional dyspnea    Hiatal hernia    History of bronchitis    History of fibrocystic disease of breast    History of uterine fibroid    HTN (hypertension)    Hyperlipidemia    Hypothyroidism    Normal nuclear stress test 2012   May 2012   OA (osteoarthritis)    "knees; left shoulder"   OSA (obstructive sleep apnea)    "haven't been using my CPAP lately" (01/21/12)   Ovarian mass  right benign   Ovarian mass    benign, right   PAD (peripheral artery disease) ([place])    Small cell carcinoma of lung ([place]) 2004   NON-SMALL CELL CARCINOMA OF THE LUNG, METASTATIC TO THE SUPRACLAVICULAR AND MEDIASTINAL LYMPH NODES; in remission   Past Surgical History:  Procedure Laterality Date   ABDOMINAL AORTIC ANEURYSM REPAIR  ~ 2010   stent graft   BLADDER SURGERY  ~ 2003   sling   BREAST BIOPSY  1960's   both breast's - benign   CARDIOVASCULAR STRESS TEST  2012   No ischemia   CATARACT EXTRACTION W/ INTRAOCULAR LENS  IMPLANT, BILATERAL Bilateral ~ 2009    REPLACEMENT TOTAL KNEE Left ~ 2008   left   THYROIDECTOMY  ~ 1964   Social History:  reports that she quit smoking about 20 years ago. Her smoking use included cigarettes. She has a 40.00 pack-year smoking history. She has never used smokeless tobacco. She reports that she does not currently use alcohol. She reports that she does not use drugs.  No Known Allergies  Family History  Problem Relation Age of Onset   Diabetes type II Mother    Diabetes Mother    Hypertension Mother    Heart disease Father    Heart attack Father    Heart disease Sister        See's Dr. Acie Fredrickson   Heart disease Brother        before age 4   Breast cancer Neg Hx     Prior to Admission medications   Medication Sig Start Date End Date Taking? Authorizing Provider  apixaban (ELIQUIS) 5 MG TABS tablet Take 1 tablet (5 mg total) by mouth 2 (two) times daily. Patient taking differently: Take 2.5 mg by mouth 2 (two) times daily. 06/27/20  Yes Martinique, Peter M, MD  Cholecalciferol (VITAMIN D) 1000 UNITS capsule Take 1,000 Units by mouth daily.   Yes [provider]  citalopram (CELEXA) 20 MG tablet Take 20 mg by mouth daily.   Yes [provider]  digoxin (LANOXIN) 0.125 MG tablet Take 0.5 tablets (0.0625 mg total) by mouth daily. 10/30/21  Yes Dunn, Areta Haber, PA-C  gabapentin (NEURONTIN) 100 MG capsule Take 1 capsule by mouth 3 (three) times daily.   Yes [provider]  ipratropium-albuterol (DUONEB) 0.5-2.5 (3) MG/3ML SOLN Take 3 mLs by nebulization 2 (two) times daily.   Yes [provider]  levothyroxine (SYNTHROID, LEVOTHROID) 88 MCG tablet Take 88 mcg by mouth daily.   Yes [provider]  melatonin 5 MG TABS Take 5 mg by mouth at bedtime.   Yes [provider]  metoprolol tartrate (LOPRESSOR) 25 MG tablet TAKE 1/2 TABLET TWICE DAILY 11/08/20  Yes Martinique, Peter M, MD  pantoprazole (PROTONIX) 40 MG tablet Take 1 tablet (40 mg total) by mouth daily. 05/10/21  Yes  Sreenath, Sudheer B, MD  potassium chloride SA (KLOR-CON M) 20 MEQ tablet Take 20 mEq by mouth 2 (two) times daily. 07/05/21  Yes [provider]  tiotropium (SPIRIVA HANDIHALER) 18 MCG inhalation capsule Place 1 capsule (18 mcg total) into inhaler and inhale daily. 09/10/20  Yes Young, Tarri Fuller D, MD  vitamin B-12 (CYANOCOBALAMIN) 100 MCG tablet Take 100 mcg by mouth daily.   Yes [provider]  acetaminophen (TYLENOL) 500 MG tablet Take 2 tablets (1,000 mg total) by mouth every 6 (six) hours as needed for fever, headache, mild pain or moderate pain (backpain). 05/09/21   Priscella Mann, [employer]  B, MD  albuterol (VENTOLIN HFA) 108 (90 Base) MCG/ACT inhaler Inhale 2 puffs into the lungs every 6 (six) hours as needed for wheezing or shortness of breath. 03/11/22   Tyler Pita, MD  feeding supplement (ENSURE ENLIVE / ENSURE PLUS) LIQD Take 237 mLs by mouth 2 (two) times daily between meals. 12/09/21   Ezekiel Slocumb, DO  morphine (MSIR) 15 MG tablet Take 0.5 tablets (7.5 mg total) by mouth every 6 (six) hours as needed. 06/25/21   Val Riles, MD  Multiple Vitamin (MULTIVITAMIN WITH MINERALS) TABS tablet Take 1 tablet by mouth daily. 12/10/21   Nicole Kindred A, DO  OXYGEN Inhale 4 L into the lungs daily. Patient states she wears 2L at night and 4L when up and moving    [provider]  polyethylene glycol (MIRALAX / GLYCOLAX) 17 g packet Take 17 g by mouth daily as needed. 12/09/21   Ezekiel Slocumb, DO  torsemide (DEMADEX) 20 MG tablet Take 40 mg by mouth 2 (two) times daily. 12/09/21   [provider]  torsemide 40 MG TABS Take 40 mg by mouth 2 (two) times daily. 12/09/21   Ezekiel Slocumb, DO   Physical Exam: Vitals:   04/02/22 1749 04/02/22 1750 04/02/22 1751 04/02/22 1754  BP:    (!) 78/66  Pulse: 98 (!) 102 99 86  Resp: (!) 21 20 19  (!) 22  Temp:      TempSrc:      SpO2: 95% 93% 95% 92%  Weight:      Height:       Physical Exam Vitals and nursing  note reviewed.  Constitutional:      Appearance: She is obese.  HENT:     Head: Normocephalic and atraumatic.  Eyes:     Extraocular Movements: Extraocular movements intact.     Pupils: Pupils are equal, round, and reactive to light.  Neck:     Vascular: JVD (Mild JVD noted with strong pulsations, Lancisi's sign) present.  Cardiovascular:     Rate and Rhythm: Normal rate. Rhythm irregular.     Heart sounds: No murmur heard.    No gallop.     Comments: Bilateral pitting edema, minimal Pulmonary:     Effort: Prolonged expiration present. No tachypnea or accessory muscle usage.     Breath sounds: Wheezing (Bilateral expiratory wheezing heard in middle and lower lung fields) and rales (Bibasilar fine crackles, right more than left) present.  Abdominal:     General: Bowel sounds are normal. There is no distension.     Palpations: Abdomen is soft.     Tenderness: There is no abdominal tenderness. There is no guarding.  Musculoskeletal:     Cervical back: Neck supple.  Skin:    General: Skin is warm and dry.  Neurological:     General: No focal deficit present.     Mental Status: She is alert and oriented to person, place, and time. Mental status is at baseline.  Psychiatric:        Mood and Affect: Mood normal.        Behavior: Behavior normal.        Thought Content: Thought content normal.        Judgment: Judgment normal.    Data Reviewed: CBC remarkable for hemoglobin of 16, MCV of 109, platelets of 107.  BMP remarkable for potassium of 5.2, chloride of 95, glucose of 115, BUN of 29 and creatinine of 1.55 with GFR of 33.  Initial  troponin elevated at 56.  Initial lactic acid elevated at 3.2.  Chest x-ray personally reviewed.  Evidence of costo diaphragmatic blunting consistent with pleural effusion on the right.  No effusion present on the left.  No focal opacities noted.  CTA results demonstrate no evidence of PE.  Remarkable for stable 3.4 cm saccular aneurysm projecting off  of the descending thoracic aorta, stable three-vessel CAD, moderate right-sided pleural effusion with small left pleural effusion, chronic emphysematous and pulmonary scarring.  Bilateral lower extremity duplex negative for DVT.  Renal ultrasound with mildly echogenic kidneys suggesting medical renal disease but negative for hydronephrosis.  EKG personally reviewed.  Atrial fibrillation with a rate of 108.  Compared to prior EKG, T wave inversion noted in lead V6.  No other acute ST or T wave changes  Results are pending, will review when available.  Assessment and Plan: * Acute on chronic respiratory failure with hypoxia [facility]) Patient presenting with 65-month decline in respiratory function, with rapid decline over the last 1 week.  On EMS arrival, she was saturating at 79% on 4 L.  Oxygen saturation has improved to 93% on 6 L.It appears that on 9/19, Dr. [name] with [facility] pulmonology recommended patient begin wearing 6 L at baseline, but this change was not made.   I suspect acute respiratory failure is multifactorial and likely progression of her chronic lung diseases.  Only acute finding on imaging is a moderate-sized pleural effusion.  Given Ms. Donahoo only has very small functional lung tissue left, this pleural effusion and subsequent atelectasis may be contributing.  Due to this, will place order to request image guided thoracentesis.  AKI and euvolemic status would limit the ability to treat pleural effusion with diuretics.  In addition, we will treat for COPD exacerbation, however no indication for antibiotics given lack of cough or sputum production.  I attempted to elicit goals of care, however patient is adamant she would like to improve on what can be improved.  She states she is a hospice patient, but would like full scope of care other than intubation.  - Continue supplemental oxygen to maintain oxygen saturation above 88% - COPD treatment as described below - IR image guided  thoracentesis pending  COPD (chronic obstructive pulmonary disease) ([place]) Patient describes 6 months of worsening shortness of breath that became significantly worse over the past 1 week.  Expiratory wheezing auscultated on examination.  We will treat with scheduled DuoNebs and steroids, but no indication for antimicrobials as patient lacks cough or sputum production.  - S/p Solu-Medrol - Start prednisone 40 mg daily tomorrow - DuoNebs every 6 hours - Prior to discharge, consider beginning triple therapy bronchodilators and possible pulmonary rehab  Pleural effusion on right CT imaging with evidence of moderate pleural effusion on the right.  I reviewed prior CT imaging obtained approximately 2 months ago that demonstrated a small right pleural effusion.  I have a low suspicion this is infectious in etiology and likely CHF related, however use of diuretics to treat effusion is limited at this moment due to AKI and euvolemic status.  Given patient's poor residual lung function, she would benefit from thoracentesis to allow for resolution of associated atelectasis.  We will still obtain pleural studies for further evaluation.  -IR image guided thoracentesis pending  AKI (acute kidney injury) ([place]) Creatinine elevated at 1.55 on admission. Patient is prescribed torsemide as needed, but states she has been taking it twice daily. She has minimal pitting edema on examination, which per  chart review, seems unusual for her. It is possible is she over-diuersed. We will hold home torsemide and gently rehydrate.  - Hold home torsemide - LR at 100 cc for 5 hours - Repeat BMP in the a.m.  Chronic systolic CHF (congestive heart failure) (HCC) - Hold home torsemide secondary to AKI  Pulmonary hypertension (HCC) Secondary to COPD and chronic hypoxic respiratory failure  -Continue outpatient follow-up with pulmonology and treatment of hypoxic respiratory failure as described  above  Hyperkalemia Hyperkalemia in the setting of AKI without EKG changes  - Lokelma 10 g one-time dose - Repeat BMP in the a.m.  Demand ischemia Patient presenting with elevated troponin at 56.  She is chest pain-free.  EKG with T wave inversion in a singular lead.  We will trend troponin, however I suspect demand ischemia in the setting of significant hypoxia.  - Trend troponin - Stat EKG for chest pain  Permanent atrial fibrillation ([place]) Since arrival to the ED, patient has been intermittently in RVR, but returns to normal rate when resting.  We will restart her home digoxin and Lopressor monitor closely.  - Continue home digoxin and metoprolol  Physical deconditioning - PT/OT  Thrombocytopenia (HCC) Chronic and stable.  -Repeat CBC in the a.m.   Advance Care Planning:   Code Status: Partial Code discussed with patient her CODE STATUS and she is clear that she would like CPR to be attempted but would not want to be intubated as she has discussed this with her pulmonologist who informed her that she would be extremely unlikely to ever come off the ventilator.  She states because of that, she would not like to be intubated in the case of an emergency.  NIV is acceptable.   Consults: IR for thoracentesis  Family Communication: No family at bedside to update  Severity of Illness: The appropriate patient status for this patient is INPATIENT. Inpatient status is judged to be reasonable and necessary in order to provide the required intensity of service to ensure the patient's safety. The patient's presenting symptoms, physical exam findings, and initial radiographic and laboratory data in the context of their chronic comorbidities is felt to place them at high risk for further clinical deterioration. Furthermore, it is not anticipated that the patient will be medically stable for discharge from the hospital within 2 midnights of admission.   * I certify that at the point of  admission it is my clinical judgment that the patient will require inpatient hospital care spanning beyond 2 midnights from the point of admission due to high intensity of service, high risk for further deterioration and high frequency of surveillance required.*  Author: Jose Persia, MD 04/02/2022 6:28 PM  For on call review [email or web address].

## 2022-04-02 NOTE — ED Notes (Signed)
Not in room, remains in CT

## 2022-04-02 NOTE — Assessment & Plan Note (Addendum)
-   Continue home digoxin and metoprolol.  Eliquis for anticoagulation.  Heart rate on the faster side but limited with hypotension.

## 2022-04-02 NOTE — Progress Notes (Signed)
[facility])       This patient is a current hospice patient with [facility], admitted 9.21.23 with a terminal diagnosis of Hypertensive heart disease with heart failure.     [facility] will continue to follow for any discharge planning needs and to coordinate continuation of hospice care.   Please don't hesitate to call with any Hospice related questions or concerns.    Thank you for the opportunity to participate in this patient's care. Jhonnie Garner, Therapist, sports, BSN, [facility] [place] [facility] Liaison [phone]

## 2022-04-02 NOTE — Assessment & Plan Note (Addendum)
Secondary to COPD and chronic hypoxic respiratory failure.

## 2022-04-02 NOTE — ED Notes (Signed)
Admitting in to see

## 2022-04-02 NOTE — Assessment & Plan Note (Deleted)
Patient presenting with elevated troponin at 56.  She is chest pain-free.  EKG with T wave inversion in a singular lead.  We will trend troponin, however I suspect demand ischemia in the setting of significant hypoxia.  - Trend troponin - Stat EKG for chest pain

## 2022-04-02 NOTE — ED Notes (Signed)
EDP at [facility] 

## 2022-04-02 NOTE — Assessment & Plan Note (Addendum)
Recent echocardiogram shows a normal EF with severe pulmonary hypertension. Patient had right thoracentesis. Currently diuresing with torsemide and Aldactone. Continue to taper oxygen as able to do soon.  Goal pulse ox greater than 88%.  Patient on heated high flow nasal cannula this morning 35% FiO2 10 L flow.  Case discussed with hospice liaison.  Can transfer on Ventimask 45% oxygen and go on high flow nasal cannula when she gets to her facility.

## 2022-04-02 NOTE — ED Notes (Signed)
Pt to CTA, remains resting comfortably on 6L [place], NAD, calm, interactive, resps e/u.

## 2022-04-02 NOTE — Assessment & Plan Note (Deleted)
PT/OT

## 2022-04-02 NOTE — Assessment & Plan Note (Addendum)
Creatinine elevated at 1.55 on admission.  Creatinine 0.92 on 04/15/2022 and up to 1.19 on 04/16/2022.  Creatinine 1.11 on discharge.

## 2022-04-02 NOTE — ED Notes (Signed)
Oxisensor changed to R thumb with improved reading, pt switched back to her baseline [place] 6L.

## 2022-04-02 NOTE — Assessment & Plan Note (Addendum)
Chronically low

## 2022-04-02 NOTE — ED Provider Notes (Signed)
[facility] Provider Note    Event Date/Time   First MD Initiated Contact with Patient 04/02/22 1339     (approximate)   History   Chief Complaint: Shortness of Breath   HPI  [name] is a 81 y.o. female with a history of COPD on 4 L nasal cannula at all times, CHF, hypertension, bilateral DVT who comes ED complaining of worsening shortness of breath for the past week.  Gradual onset, severe, noted to have an oxygen saturation of 79% on her usual 4 L nasal cannula.  This was improved by increasing her to 6 L nasal cannula.  Denies chest pain, fever, vomiting, or other acute symptoms.  She has some occasional cough but nothing frequent or productive.     Physical Exam   Triage Vital Signs: ED Triage Vitals  Enc Vitals Group     BP 04/02/22 1314 (!) 104/58     Pulse Rate 04/02/22 1314 81     Resp 04/02/22 1314 (!) 26     Temp 04/02/22 1314 98.5 F (36.9 C)     Temp Source 04/02/22 1314 Oral     SpO2 04/02/22 1314 (!) 82 %     Weight 04/02/22 1305 200 lb (90.7 kg)     Height 04/02/22 1305 5' (1.524 m)     Head Circumference --      Peak Flow --      Pain Score 04/02/22 1305 0     Pain Loc --      Pain Edu? --      Excl. in [place]? --     Most recent vital signs: Vitals:   04/02/22 1314  BP: (!) 104/58  Pulse: 81  Resp: (!) 26  Temp: 98.5 F (36.9 C)  SpO2: (!) 82%    General: Awake, no distress.  CV:  Good peripheral perfusion.  Irregular rhythm.  Symmetric distal pulses Resp:  Normal effort.  Mild expiratory wheezing and prolonged expiratory phase.  Diminished breath sounds on the right side. Abd:  No distention.  Soft nontender Other:  Chronic lymphedema bilateral lower extremities with chronic appearing skin changes, left greater than right swelling.  No focal inflammatory changes, wounds, drainage, or tenderness   ED Results / Procedures / Treatments   Labs (all labs ordered are listed, but only abnormal results are  displayed) Labs Reviewed  BASIC METABOLIC PANEL - Abnormal; Notable for the following components:      Result Value   Potassium 5.2 (*)    Chloride 95 (*)    Glucose, Bld 115 (*)    BUN 29 (*)    Creatinine, Ser 1.55 (*)    GFR, Estimated 33 (*)    All other components within normal limits  CBC - Abnormal; Notable for the following components:   Hemoglobin 16.0 (*)    HCT 51.1 (*)    MCV 109.2 (*)    MCH 34.2 (*)    Platelets 107 (*)    All other components within normal limits  LACTIC ACID, PLASMA - Abnormal; Notable for the following components:   Lactic Acid, Venous 3.2 (*)    All other components within normal limits  TROPONIN I (HIGH SENSITIVITY) - Abnormal; Notable for the following components:   Troponin I (High Sensitivity) 56 (*)    All other components within normal limits  CULTURE, BLOOD (SINGLE)  LACTIC ACID, PLASMA  TROPONIN I (HIGH SENSITIVITY)     EKG Interpreted by me Atrial fibrillation, rate  of 108.  Left axis, normal intervals.  Normal QRS ST segments and T waves.  No ischemic changes.   RADIOLOGY Chest x-ray interpreted by me, shows clear lungs, no pneumothorax, small right pleural effusion.  Radiology report reviewed.   PROCEDURES:  .Critical Care  Performed by: Carrie Mew, MD Authorized by: Carrie Mew, MD   Critical care provider statement:    Critical care time (minutes):  35   Critical care time was exclusive of:  Separately billable procedures and treating other patients   Critical care was necessary to treat or prevent imminent or life-threatening deterioration of the following conditions:  Sepsis and respiratory failure   Critical care was time spent personally by me on the following activities:  Development of treatment plan with patient or surrogate, discussions with consultants, evaluation of patient's response to treatment, examination of patient, obtaining history from patient or surrogate, ordering and performing  treatments and interventions, ordering and review of laboratory studies, ordering and review of radiographic studies, pulse oximetry, re-evaluation of patient's condition and review of old charts   Care discussed with: admitting provider   Comments:        .1-3 Lead EKG Interpretation  Performed by: Carrie Mew, MD Authorized by: Carrie Mew, MD     Interpretation: abnormal     ECG rate:  110   ECG rate assessment: tachycardic     Rhythm: atrial fibrillation     Ectopy: none     Conduction: normal      MEDICATIONS ORDERED IN ED: Medications  cefTRIAXone (ROCEPHIN) 2 g in sodium chloride 0.9 % 100 mL IVPB (has no administration in time range)  azithromycin (ZITHROMAX) 500 mg in sodium chloride 0.9 % 250 mL IVPB (has no administration in time range)  methylPREDNISolone sodium succinate (SOLU-MEDROL) 125 mg/2 mL injection 125 mg (has no administration in time range)  ipratropium-albuterol (DUONEB) 0.5-2.5 (3) MG/3ML nebulizer solution 3 mL (has no administration in time range)  albuterol (PROVENTIL) (2.5 MG/3ML) 0.083% nebulizer solution 5 mg (has no administration in time range)     IMPRESSION / MDM / ASSESSMENT AND PLAN / ED COURSE  I reviewed the triage vital signs and the nursing notes.                              Differential diagnosis includes, but is not limited to, pneumonia, pleural effusion, pulmonary edema, pneumothorax, COPD exacerbation, pulmonary embolism, non-STEMI  Patient's presentation is most consistent with acute presentation with potential threat to life or bodily function.  Patient presents with shortness of breath and acute on chronic hypoxic respiratory failure.  Labs show AKI and a mild elevation of potassium.  Lactate is 3.2.  Troponin is elevated to 56 which is increased from chronic baseline of about 18.  Will trend.  Will obtain CT angiogram to evaluate for PE.  Patient will need to be admitted for further management of her hypoxic  respiratory failure.  We will give bronchodilators, Solu-Medrol, ceftriaxone, azithromycin.    ----------------------------------------- 2:47 PM on 04/02/2022 ----------------------------------------- Antibiotics and other medications are being delayed by poor IV access.  Unable to obtain blood cultures as well due to difficult IV access.       FINAL CLINICAL IMPRESSION(S) / ED DIAGNOSES   Final diagnoses:  Acute on chronic respiratory failure with hypoxia ([place])     Rx / DC Orders   ED Discharge Orders     None  Note:  This document was prepared using Dragon voice recognition software and may include unintentional dictation errors.   Carrie Mew, MD 04/02/22 [phone]

## 2022-04-02 NOTE — ED Triage Notes (Signed)
Pt BIB EMS for SOB that started a few days ago. Per EMS, pt sat was 79% on 4L. Pt wears home O2 at 4L. Pt was replaced on NRB by EMS. Pt O2 sat was 80% in triage on 4L.

## 2022-04-02 NOTE — Assessment & Plan Note (Addendum)
Patient describes 6 months of worsening shortness of breath that became significantly worse over the past 1 week.  Expiratory wheezing auscultated on examination.  We will treat with scheduled DuoNebs and steroids, but no indication for antimicrobials as patient lacks cough or sputum production.  - S/p Solu-Medrol - Start prednisone 40 mg daily tomorrow - DuoNebs every 6 hours - Prior to discharge, consider beginning triple therapy bronchodilators and possible pulmonary rehab

## 2022-04-02 NOTE — Assessment & Plan Note (Signed)
-   Hold home torsemide secondary to AKI

## 2022-04-02 NOTE — ED Provider Triage Note (Signed)
Emergency Medicine Provider Triage Evaluation Note  Francenia Chimenti , a 81 y.o. female  was evaluated in triage.  Pt complains of short of breath. Uses a neb tx am and pm but doesn't remember if she did today.  EMS reported O2 sat in 70's.    Review of Systems  Positive: SOB, + ankle swelling Negative: No cough, no fever, No CP  Physical Exam  Ht 5' (1.524 m)   Wt 90.7 kg   BMI 39.06 kg/m  Gen:   Awake, no distress   Alert Resp:  Normal effort Lung sounds diminished bilaterally  MSK:   Lower extremity non-pitting edema Other:    Medical Decision Making  Medically screening exam initiated at 1:14 PM.  Appropriate orders placed.  Orpha Dain was informed that the remainder of the evaluation will be completed by another provider, this initial triage assessment does not replace that evaluation, and the importance of remaining in the ED until their evaluation is complete.     Johnn Hai, PA-C 04/02/22 1318

## 2022-04-03 ENCOUNTER — Inpatient Hospital Stay

## 2022-04-03 DIAGNOSIS — J9621 Acute and chronic respiratory failure with hypoxia: Secondary | ICD-10-CM

## 2022-04-03 LAB — LACTATE DEHYDROGENASE, PLEURAL OR PERITONEAL FLUID: LD, Fluid: 95 U/L — ABNORMAL HIGH (ref 3–23)

## 2022-04-03 LAB — CBC WITH DIFFERENTIAL/PLATELET
Abs Immature Granulocytes: 0.01 K/uL (ref 0.00–0.07)
Abs Immature Granulocytes: 0.03 K/uL (ref 0.00–0.07)
Basophils Absolute: 0 K/uL (ref 0.0–0.1)
Basophils Absolute: 0 K/uL (ref 0.0–0.1)
Basophils Relative: 0 %
Basophils Relative: 0 %
Eosinophils Absolute: 0 K/uL (ref 0.0–0.5)
Eosinophils Absolute: 0 K/uL (ref 0.0–0.5)
Eosinophils Relative: 0 %
Eosinophils Relative: 0 %
HCT: 48.4 % — ABNORMAL HIGH (ref 36.0–46.0)
HCT: 50.4 % — ABNORMAL HIGH (ref 36.0–46.0)
Hemoglobin: 15.1 g/dL — ABNORMAL HIGH (ref 12.0–15.0)
Hemoglobin: 15.4 g/dL — ABNORMAL HIGH (ref 12.0–15.0)
Immature Granulocytes: 0 %
Immature Granulocytes: 1 %
Lymphocytes Relative: 8 %
Lymphocytes Relative: 11 %
Lymphs Abs: 0.4 K/uL — ABNORMAL LOW (ref 0.7–4.0)
Lymphs Abs: 0.6 K/uL — ABNORMAL LOW (ref 0.7–4.0)
MCH: 34.3 pg — ABNORMAL HIGH (ref 26.0–34.0)
MCH: 34.1 pg — ABNORMAL HIGH (ref 26.0–34.0)
MCHC: 31.2 g/dL (ref 30.0–36.0)
MCHC: 30.6 g/dL (ref 30.0–36.0)
MCV: 110 fL — ABNORMAL HIGH (ref 80.0–100.0)
MCV: 111.5 fL — ABNORMAL HIGH (ref 80.0–100.0)
Monocytes Absolute: 0.1 K/uL (ref 0.1–1.0)
Monocytes Absolute: 0.2 K/uL (ref 0.1–1.0)
Monocytes Relative: 2 %
Monocytes Relative: 3 %
Neutro Abs: 4.5 K/uL (ref 1.7–7.7)
Neutro Abs: 4.2 K/uL (ref 1.7–7.7)
Neutrophils Relative %: 90 %
Neutrophils Relative %: 85 %
Platelets: 94 K/uL — ABNORMAL LOW (ref 150–400)
Platelets: 100 K/uL — ABNORMAL LOW (ref 150–400)
RBC: 4.4 MIL/uL (ref 3.87–5.11)
RBC: 4.52 MIL/uL (ref 3.87–5.11)
RDW: 14 % (ref 11.5–15.5)
RDW: 14.1 % (ref 11.5–15.5)
Smear Review: NORMAL
WBC: 5 K/uL (ref 4.0–10.5)
WBC: 5 K/uL (ref 4.0–10.5)
nRBC: 0 % (ref 0.0–0.2)
nRBC: 0 % (ref 0.0–0.2)

## 2022-04-03 LAB — BASIC METABOLIC PANEL
Anion gap: 13 (ref 5–15)
Anion gap: 11 (ref 5–15)
BUN: 31 mg/dL — ABNORMAL HIGH (ref 8–23)
BUN: 30 mg/dL — ABNORMAL HIGH (ref 8–23)
CO2: 29 mmol/L (ref 22–32)
CO2: 30 mmol/L (ref 22–32)
Calcium: 8.6 mg/dL — ABNORMAL LOW (ref 8.9–10.3)
Calcium: 8.7 mg/dL — ABNORMAL LOW (ref 8.9–10.3)
Chloride: 97 mmol/L — ABNORMAL LOW (ref 98–111)
Chloride: 98 mmol/L (ref 98–111)
Creatinine, Ser: 1.63 mg/dL — ABNORMAL HIGH (ref 0.44–1.00)
Creatinine, Ser: 1.65 mg/dL — ABNORMAL HIGH (ref 0.44–1.00)
GFR, Estimated: 31 mL/min — ABNORMAL LOW (ref >60)
GFR, Estimated: 31 mL/min — ABNORMAL LOW (ref >60)
Glucose, Bld: 210 mg/dL — ABNORMAL HIGH (ref 70–99)
Glucose, Bld: 199 mg/dL — ABNORMAL HIGH (ref 70–99)
Potassium: 4.4 mmol/L (ref 3.5–5.1)
Potassium: 4.2 mmol/L (ref 3.5–5.1)
Sodium: 139 mmol/L (ref 135–145)
Sodium: 139 mmol/L (ref 135–145)

## 2022-04-03 LAB — SARS CORONAVIRUS 2 BY RT PCR: SARS Coronavirus 2 by RT PCR: NEGATIVE

## 2022-04-03 LAB — BLOOD GAS, ARTERIAL
Acid-Base Excess: 3.7 mmol/L — ABNORMAL HIGH (ref 0.0–2.0)
Bicarbonate: 29.2 mmol/L — ABNORMAL HIGH (ref 20.0–28.0)
O2 Content: 8 L/min
O2 Saturation: 93.9 %
Patient temperature: 37
pCO2 arterial: 46 mmHg (ref 32–48)
pH, Arterial: 7.41 (ref 7.35–7.45)
pO2, Arterial: 68 mmHg — ABNORMAL LOW (ref 83–108)

## 2022-04-03 LAB — LACTIC ACID, PLASMA: Lactic Acid, Venous: 4.1 mmol/L (ref 0.5–1.9)

## 2022-04-03 LAB — PROTEIN, TOTAL: Total Protein: 6.8 g/dL (ref 6.5–8.1)

## 2022-04-03 LAB — BODY FLUID CELL COUNT WITH DIFFERENTIAL
Eos, Fluid: 0 %
Lymphs, Fluid: 83 %
Monocyte-Macrophage-Serous Fluid: 10 %
Neutrophil Count, Fluid: 7 %
Total Nucleated Cell Count, Fluid: 114 cu mm

## 2022-04-03 LAB — LACTATE DEHYDROGENASE: LDH: 295 U/L — ABNORMAL HIGH (ref 98–192)

## 2022-04-03 LAB — PROTEIN, PLEURAL OR PERITONEAL FLUID: Total protein, fluid: <3 g/dL

## 2022-04-03 MED ORDER — ADULT MULTIVITAMIN W/MINERALS CH
1.0000 | ORAL_TABLET | Freq: Every day | ORAL | Status: DC
  Administered 2022-04-04 – 2022-04-18 (×14): 1 via ORAL
  Filled 2022-04-03 (×15): qty 1

## 2022-04-03 MED ORDER — IPRATROPIUM-ALBUTEROL 0.5-2.5 (3) MG/3ML IN SOLN
3.0000 mL | Freq: Two times a day (BID) | RESPIRATORY_TRACT | Status: DC
  Administered 2022-04-03: 3 mL via RESPIRATORY_TRACT
  Filled 2022-04-03: qty 3

## 2022-04-03 MED ORDER — MELATONIN 5 MG PO TABS
5.0000 mg | ORAL_TABLET | Freq: Every day | ORAL | Status: DC
  Administered 2022-04-03 – 2022-04-17 (×15): 5 mg via ORAL
  Filled 2022-04-03 (×15): qty 1

## 2022-04-03 MED ORDER — VITAMIN D 25 MCG (1000 UNIT) PO TABS
1000.0000 [IU] | ORAL_TABLET | Freq: Every day | ORAL | Status: DC
  Administered 2022-04-03 – 2022-04-18 (×15): 1000 [IU] via ORAL
  Filled 2022-04-03 (×16): qty 1

## 2022-04-03 MED ORDER — SODIUM CHLORIDE 0.9 % IV SOLN: INTRAVENOUS | Status: AC

## 2022-04-03 MED ORDER — VITAMIN B-12 100 MCG PO TABS
100.0000 ug | ORAL_TABLET | Freq: Every day | ORAL | Status: DC
  Administered 2022-04-03 – 2022-04-18 (×15): 100 ug via ORAL
  Filled 2022-04-03 (×16): qty 1

## 2022-04-03 MED ORDER — TIOTROPIUM BROMIDE MONOHYDRATE 18 MCG IN CAPS: 18.0000 ug | ORAL_CAPSULE | Freq: Every day | RESPIRATORY_TRACT | Status: DC

## 2022-04-03 MED ORDER — ENSURE ENLIVE PO LIQD
237.0000 mL | Freq: Two times a day (BID) | ORAL | Status: DC
  Administered 2022-04-03 (×2): 237 mL via ORAL

## 2022-04-03 MED ORDER — ACETAMINOPHEN 500 MG PO TABS
1000.0000 mg | ORAL_TABLET | Freq: Four times a day (QID) | ORAL | Status: DC | PRN
  Administered 2022-04-10 – 2022-04-15 (×2): 1000 mg via ORAL
  Filled 2022-04-03 (×2): qty 2

## 2022-04-03 MED ORDER — ALBUTEROL SULFATE (2.5 MG/3ML) 0.083% IN NEBU
3.0000 mL | INHALATION_SOLUTION | Freq: Four times a day (QID) | RESPIRATORY_TRACT | Status: DC | PRN
  Administered 2022-04-18: 3 mL via RESPIRATORY_TRACT
  Filled 2022-04-03: qty 3

## 2022-04-03 MED ORDER — TORSEMIDE 40 MG PO TABS: 40.0000 mg | ORAL_TABLET | Freq: Two times a day (BID) | ORAL | Status: DC

## 2022-04-03 NOTE — Consult Note (Signed)
NAME:  [name], MRN:  [id number], DOB:  1941-05-18, LOS: 1 ADMISSION DATE:  04/02/2022, CONSULTATION DATE: 04/03/2022 REFERRING MD: Laurey Arrow, MD CHIEF COMPLAINT: Acute on chronic respiratory failure.  History of Present Illness:  Patient is an 81 year old very complex  former smoker who has severe COPD, chronic respiratory failure with hypoxia, OSA/failed CPAP, history of lung cancer status post XRT/chemotherapy and chronic A-fib and chronic combined systolic and diastolic heart failure and SEVERE pulmonary hypertension, who presented to [facility] on 10/11 with worsening shortness of breath.  Patient is well-known to me from [place] follow-up.  I first evaluated the patient in June 2023, she had been following at [facility] in [place] for many years.  During that initial visit patient had to be admitted to [facility] due to decompensation of acute on chronic systolic and diastolic heart failure. She then presented again to our clinic on 11 March 2022 for a preoperative assessment.  It appears that vascular surgery intended to do some endovascular repair for an ascending aortic aneurysm.  However evaluation at that time showed that the patient was still very compensated with regards to her chronic respiratory failure.  It was recommended that she be admitted again for more aggressive management however the patient declined [place].  I discussed end-of-life issues with her at that time and realistically there is no intervention that can help her with her myriad medical problems.  Recommended palliative/hospice care she was referred at that time.  She also was supposed to have her liter flow of oxygen increased to 6 L/min.  Patient has had respiratory decline over the past 6 to 8 months.  Her oxygen requirements have increased steadily during that time.  She now basically is confined to the bed at her assisted living.  Relies on others to help her reach the  dining room at her assisted living facility.  Presented yesterday with A-fib with RVR which has been an issue for her as her A-fib has not been well controlled.  She had a CT chest performed that showed no pulmonary embolism.  She has a 3.4 saccular aneurysm in the descending thoracic aorta which is unchanged from prior.  CT chest also showed moderate right sided pleural effusion with atelectasis as well as a small left pleural effusion with basilar atelectasis.  There are chronic changes in the lung that are not overly change from prior.  She also has been noted to have acute on chronic kidney injury.  The patient states that her dyspnea is somewhat better since admission.  I suspect this is due to the fact that she is getting adequate oxygen supplementation.  She does not endorse any chest pain.  No fevers, chills or sweats.  Cough is at baseline, sometimes productive of whitish to yellowish sputum.  No hemoptysis.  Chronic tachypalpitations.  Chronic lower extremity edema.  Does not endorse any new symptomatology  We have been asked to evaluate the patient to determine if there is any other modalities that may improve on her baseline severely chronically ill status.   Pertinent  Medical History   Patient Active Problem List   Diagnosis Date Noted   Pulmonary hypertension ([place]) 04/02/2022   AKI (acute kidney injury) ([place]) 04/02/2022   Hyperkalemia 04/02/2022   Pleural effusion on right 04/02/2022   [place]and ischemia 04/02/2022   Aneurysm of thoracic aorta ([place]) 01/12/2022   Vaginal bleeding 12/23/2021   Thrombocytopenia ([place]) 12/04/2021   Permanent atrial fibrillation ([place])  Goals of care, counseling/discussion 12/03/2021   Acute on chronic heart failure with preserved ejection fraction (HFpEF) ([place]) 12/02/2021   Chronic low back pain 12/02/2021   Obesity (BMI 30-39.9) 12/02/2021   Anxiety and depression    Osteoarthritis of left shoulder 08/30/2021   Falls 07/15/2021   Acute on  chronic respiratory failure with hypoxia ([place]) 05/04/2021   Frequent UTI    Anticoagulant long-term use    Physical deconditioning 54/00/8676   Chronic systolic CHF (congestive heart failure) ([place]) 09/15/2016   Hyperlipidemia    Chronic respiratory failure with hypoxia ([place]) 12/03/2014   Multiple thyroid nodules 10/17/2012   AAA (abdominal aortic aneurysm) ([place]) 05/11/2012   Bradycardia 01/23/2012   Hypothyroid 01/21/2012   HTN (hypertension) 01/21/2012   Obstructive sleep apnea 10/16/2008   History of DVT (deep vein thrombosis) 08/19/2008   COPD (chronic obstructive pulmonary disease) ([place]) 08/19/2008   NEOPLASM, MALIGNANT, LUNG, HX OF 19/50/9326   Diastolic heart failure, NYHA class 2 ([place]) 08/03/2008   Hyperbilirubinemia 2004   Significant Hospital Events: Including procedures, antibiotic start and stop dates in addition to other pertinent events   10/11 admitted for decompensation of chronic respiratory failure 10/12 PCCM consulted   Interim History / Subjective:  Feels that her breathing is pretty much at baseline.  Very tachypneic but this is baseline for her.  Does have significant conversational dyspnea.  Objective   Blood pressure 103/83, pulse (!) 115, temperature 97.6 F (36.4 C), temperature source Oral, resp. rate (!) 27, height 5' (1.524 m), weight 90.7 kg, SpO2 92 %.    FiO2 (%):  [99 %-100 %] 99 %   Intake/Output Summary (Last 24 hours) at 04/03/2022 1142 Last data filed at 04/02/2022 2200 Gross per 24 hour  Intake 1672.12 ml  Output --  Net 1672.12 ml   Filed Weights   04/02/22 1305  Weight: 90.7 kg    Examination: GENERAL: Obese elderly woman, mild to moderate conversational dyspnea.  Laying in bed.  Tachypneic. HEAD: Normocephalic, atraumatic.  EYES: Pupils equal, round, reactive to light.  No scleral icterus.  MOUTH: Very poor dentition, oral mucosa moist. NECK: Supple. No thyromegaly. Trachea midline.  Neck veins full.  No  adenopathy. PULMONARY: Good air entry bilaterally.  Diminished breath sounds right, coarse on left.  No wheezes. CARDIOVASCULAR: S1 and S2.  Irregular rate and rhythm with rapid ventricular response grade 2/6 systolic ejection murmur left sternal border. ABDOMEN: Obese otherwise benign. MUSCULOSKELETAL: No joint deformity, no clubbing, +2-3 edema, indurated, nonpitting, Significant kyphosis. NEUROLOGIC: No overt localizing sign.  Gait not tested. SKIN: Intact,warm,dry.  Chronic stasis changes bilaterally. PSYCH: Mood and behavior normal.  Resolved Hospital Problem list   N/A  Assessment & Plan:  Acute on chronic respiratory failure with hypoxia and hypercapnia Acute on chronic combined systolic and diastolic heart failure Pleural effusions secondary to the above Oxygen to maintain saturations between 88 to 92% Withholding diuresis due to worsening kidney function Agree with thoracentesis of the right pleural effusion Thoracentesis will only afford transient relief Supportive care  Severe COPD without acute exacerbation Persistent decline over 6 to 8 months Does not appear to be in COPD exacerbation per se Patient is END-STAGE We will taper off steroids rapidly Continue DuoNebs Once tapered off steroids may add Pulmicort twice a day  Atrial fibrillation with RVR Rapid ventricular response driving much of tachypnea Consider cardiology evaluation for rate control  Severe pulmonary hypertension This is main driver for her hypoxia Mixed etiology: Chronic hypoxic vasoconstriction/diastolic heart failure Continue  oxygen supplementation Not a candidate for pulmonary vasodilators Poor prognosis  Adult failure to thrive Persistent physiologic decline Patient deteriorating due to the below comorbidities at end-stage Continue palliative/hospice follow-up/care Recommend comfort measures  Best Practice (right click and "Reselect all SmartList Selections" daily)   Diet/type: Regular  consistency (see orders) DVT prophylaxis: DOAC GI prophylaxis: PPI Lines: N/A Foley:  N/A Code Status:  limited Last date of multidisciplinary goals of care discussion [10/12] discussed with patient at length.  She understands that should she require intubation and mechanical ventilation she would not be able to wean off the ventilator.  Labs   CBC: Recent Labs  Lab 04/02/22 1345 04/03/22 0129 04/03/22 0421  WBC 7.0 5.0 5.0  NEUTROABS  --  4.5 4.2  HGB 16.0* 15.1* 15.4*  HCT 51.1* 48.4* 50.4*  MCV 109.2* 110.0* 111.5*  PLT 107* 94* 100*    Basic Metabolic Panel: Recent Labs  Lab 04/02/22 1345 04/03/22 0129 04/03/22 0421  NA 137 139 139  K 5.2* 4.4 4.2  CL 95* 97* 98  CO2 31 29 30   GLUCOSE 115* 210* 199*  BUN 29* 31* 30*  CREATININE 1.55* 1.63* 1.65*  CALCIUM 9.4 8.6* 8.7*   GFR: Estimated Creatinine Clearance: 25.9 mL/min (A) (by C-G formula based on SCr of 1.65 mg/dL (H)). Recent Labs  Lab 04/02/22 1345 04/02/22 1839 04/03/22 0129 04/03/22 0421  WBC 7.0  --  5.0 5.0  LATICACIDVEN 3.2* 5.6* 4.1*  --     Liver Function Tests: No results for input(s): "AST", "ALT", "ALKPHOS", "BILITOT", "PROT", "ALBUMIN" in the last 168 hours. No results for input(s): "LIPASE", "AMYLASE" in the last 168 hours. No results for input(s): "AMMONIA" in the last 168 hours.     Coagulation Profile: No results for input(s): "INR", "PROTIME" in the last 168 hours.  Cardiac Enzymes: No results for input(s): "CKTOTAL", "CKMB", "CKMBINDEX", "TROPONINI" in the last 168 hours.  HbA1C: No results found for: "HGBA1C"  CBG: No results for input(s): "GLUCAP" in the last 168 hours.  Review of Systems:   A 10 point review of systems was performed and it is as noted above otherwise negative.  Past Medical History:  She,  has a past medical history of AAA (abdominal aortic aneurysm) ([place]), Anticoagulant long-term use, Anxiety, Anxiety and depression, Atrial fib/flutter, transient  (June 2012), Chronic lower back pain, Chronic respiratory failure with hypoxia ([place]) (12/03/2014), COPD (chronic obstructive pulmonary disease) ([place]), DVT (deep venous thrombosis) ([place]) (2004), Esophageal dysmotility, Exertional dyspnea, Hiatal hernia, History of bronchitis, History of fibrocystic disease of breast, History of uterine fibroid, HTN (hypertension), Hyperlipidemia, Hypothyroidism, Normal nuclear stress test (2012), OA (osteoarthritis), OSA (obstructive sleep apnea), Ovarian mass, Ovarian mass, PAD (peripheral artery disease) ([place]), and Small cell carcinoma of lung ([place]) (2004).   Surgical History:   Past Surgical History:  Procedure Laterality Date   ABDOMINAL AORTIC ANEURYSM REPAIR  ~ 2010   stent graft   BLADDER SURGERY  ~ 2003   sling   BREAST BIOPSY  1960's   both breast's - benign   CARDIOVASCULAR STRESS TEST  2012   No ischemia   CATARACT EXTRACTION W/ INTRAOCULAR LENS  IMPLANT, BILATERAL Bilateral ~ 2009   REPLACEMENT TOTAL KNEE Left ~ 2008   left   THYROIDECTOMY  ~ 1964     Social History:   reports that she quit smoking about 20 years ago. Her smoking use included cigarettes. She has a 40.00 pack-year smoking history. She has never used smokeless tobacco. She reports that  she does not currently use alcohol. She reports that she does not use drugs.   Family History:  Her family history includes Diabetes in her mother; Diabetes type II in her mother; Heart attack in her father; Heart disease in her brother, father, and sister; Hypertension in her mother. There is no history of Breast cancer.   Allergies No Known Allergies   Home Medications  Prior to Admission medications   Medication Sig Start Date End Date Taking? Authorizing Provider  apixaban (ELIQUIS) 5 MG TABS tablet Take 1 tablet (5 mg total) by mouth 2 (two) times daily. Patient taking differently: Take 2.5 mg by mouth 2 (two) times daily. 06/27/20  Yes [place], Peter M, MD  Cholecalciferol (VITAMIN D) 1000  UNITS capsule Take 1,000 Units by mouth daily.   Yes [provider]  citalopram (CELEXA) 20 MG tablet Take 20 mg by mouth daily.   Yes [provider]  digoxin (LANOXIN) 0.125 MG tablet Take 0.5 tablets (0.0625 mg total) by mouth daily. 10/30/21  Yes Dunn, Areta Haber, PA-C  gabapentin (NEURONTIN) 100 MG capsule Take 1 capsule by mouth 3 (three) times daily.   Yes [provider]  ipratropium-albuterol (DUONEB) 0.5-2.5 (3) MG/3ML SOLN Take 3 mLs by nebulization 2 (two) times daily.   Yes [provider]  levothyroxine (SYNTHROID, LEVOTHROID) 88 MCG tablet Take 88 mcg by mouth daily.   Yes [provider]  melatonin 5 MG TABS Take 5 mg by mouth at bedtime.   Yes [provider]  metoprolol tartrate (LOPRESSOR) 25 MG tablet TAKE 1/2 TABLET TWICE DAILY 11/08/20  Yes [place], Peter M, MD  pantoprazole (PROTONIX) 40 MG tablet Take 1 tablet (40 mg total) by mouth daily. 05/10/21  Yes Sreenath, Sudheer B, MD  potassium chloride SA (KLOR-CON M) 20 MEQ tablet Take 20 mEq by mouth 2 (two) times daily. 07/05/21  Yes [provider]  tiotropium (SPIRIVA HANDIHALER) 18 MCG inhalation capsule Place 1 capsule (18 mcg total) into inhaler and inhale daily. 09/10/20  Yes Young, Tarri Fuller D, MD  vitamin B-12 (CYANOCOBALAMIN) 100 MCG tablet Take 100 mcg by mouth daily.   Yes [provider]  acetaminophen (TYLENOL) 500 MG tablet Take 2 tablets (1,000 mg total) by mouth every 6 (six) hours as needed for fever, headache, mild pain or moderate pain (backpain). 05/09/21   Sidney Ace, MD  albuterol (VENTOLIN HFA) 108 (90 Base) MCG/ACT inhaler Inhale 2 puffs into the lungs every 6 (six) hours as needed for wheezing or shortness of breath. 03/11/22   Tyler Pita, MD  feeding supplement (ENSURE ENLIVE / ENSURE PLUS) LIQD Take 237 mLs by mouth 2 (two) times daily between meals. 12/09/21   Ezekiel Slocumb, DO  morphine (MSIR) 15 MG tablet Take 0.5 tablets  (7.5 mg total) by mouth every 6 (six) hours as needed. 06/25/21   Val Riles, MD  Multiple Vitamin (MULTIVITAMIN WITH MINERALS) TABS tablet Take 1 tablet by mouth daily. 12/10/21   Nicole Kindred A, DO  OXYGEN Inhale 4 L into the lungs daily. Patient states she wears 2L at night and 4L when up and moving    [provider]  polyethylene glycol (MIRALAX / GLYCOLAX) 17 g packet Take 17 g by mouth daily as needed. 12/09/21   Ezekiel Slocumb, DO  torsemide (DEMADEX) 20 MG tablet Take 40 mg by mouth 2 (two) times daily. 12/09/21   [provider]  torsemide 40 MG TABS Take 40 mg by mouth 2 (  two) times daily. 12/09/21   Ezekiel Slocumb, DO     Scheduled Meds:  apixaban  2.5 mg Oral BID   cholecalciferol  1,000 Units Oral Daily   citalopram  20 mg Oral Daily   digoxin  0.0625 mg Oral Daily   feeding supplement  237 mL Oral BID BM   gabapentin  100 mg Oral TID   ipratropium-albuterol  3 mL Nebulization Q6H   levothyroxine  88 mcg Oral Q0600   melatonin  5 mg Oral QHS   metoprolol tartrate  12.5 mg Oral BID   multivitamin with minerals  1 tablet Oral Daily   pantoprazole  40 mg Oral Daily   predniSONE  40 mg Oral Q breakfast   vitamin B-12  100 mcg Oral Daily   Continuous Infusions:  sodium chloride 50 mL/hr at 04/03/22 1008   PRN Meds:.acetaminophen, albuterol, morphine, polyethylene glycol   Level 4 consult    Had long conversation with the patient I reiterated our conversation from 19 September.  At that time I advised her that I recommended palliative/hospice care.    Renold Don, MD Advanced Bronchoscopy [facility]    *This note was dictated using voice recognition software/Dragon.  Despite best efforts to proofread, errors can occur which can change the meaning. Any transcriptional errors that result from this process are unintentional and may not be fully corrected at the time of dictation.

## 2022-04-03 NOTE — Progress Notes (Signed)
PROGRESS NOTE    [name]  WNU:[id number] DOB: [birth year] DOA: 04/02/2022 PCP: Jinny Sanders, MD  Outpatient Specialists: pulmonology, cardiology    Brief Narrative:   From admission h and p [name] is a 81 y.o. female with medical history significant of severe COPD c/b chronic hypoxic and hypercapnic respiratory failure on 4 L [place] O2, severe pulmonary hypertension, systolic CHF, atrial fibrillation on Eliquis, peripheral artery disease, NSCLC s/p XRT/chemotherapy in remission, OSA not on CPAP, who presents to the ED with shortness of breath.   [name] states she has been experiencing a gradual decline over the last 6 months and has noticed she has become increasingly weak due to sedentary lifestyle at her ALF.  During this time, she has experienced increased dyspnea on exertion.  She has noticed a marked worsening in her work of breathing over the last week ago.  She endorses increased shortness of breath only, denying any cough or sputum production.  She denies any fever, chills, congestion, recent nausea or vomiting, diarrhea, abdominal pain, urinary or bowel changes, or focal weakness.   Per chart review, patient followed up with Dr. [name] at [facility] pulmonology on 9/19.  At that time, she was saturating at 78% on 4 L nasal cannula O2.  She was increased to 6 L at that time with improvement in saturation to 89% she was in atrial fibrillation with RVR with blood pressure of 95/58.  At that time, she was suspected to be experiencing acute on chronic CHF.  However patient declined evaluation in the ED.  It was recommended at that time that her baseline O2 be increased to 6 L; unclear if this change was made at patient's ALF.  Assessment & Plan:   Principal Problem:   Acute on chronic respiratory failure with hypoxia (HCC) Active Problems:   COPD (chronic obstructive pulmonary disease) (HCC)   Pleural effusion on right   Hypothyroid   AKI (acute kidney injury)  ([place])   Obstructive sleep apnea   Chronic systolic CHF (congestive heart failure) (HCC)   Pulmonary hypertension (HCC)   Hyperkalemia   Demand ischemia   Permanent atrial fibrillation (HCC)   Physical deconditioning   Thrombocytopenia (HCC)   HTN (hypertension)  # Acute on chronic hypoxic respiratory failure # COPD Baseline at least 4 L need, here tachypnic on 8. Multifactorial, 2/2 copd, osa, dCHF, phtn. No PE on CT imaging. Does have moderate pleural effusion. No fever or cough or abrupt changes to suggest copd exacerbation though course of steroids may help. No chest pain. Does not appear to be chf exacerbation. Followed by palliative as outpt - continue steroids, duonebs - continue oxygen - palliative consult - thoracentesis diagnostic/therapeutic ordered by admitting provider - f/u covid  # Debility Resides in ALF - PT/OT consults  # AKI  Cr 1.6 from baseline of around 1. Does not appear to be significantly fluid overloaded. Did receive IV contrast on 10/11 - d/c home torsemide for now - gentle hydration - monitor  # HFpEF Appears relatively compensated - hold home torsemide  # A-fib Here HR somewhat elevated - cont home apixaban, digoxin, metoprolol  # Hypothyroid - cont home synthroid   DVT prophylaxis: apixaban Code Status: partial (no intubationi) Family Communication: no answer when either daughter called. No one at bedside  Level of care: Progressive Status is: Inpatient Remains inpatient appropriate because: severity of illness    Consultants:  pulmonology  Procedures: [place] pending  Antimicrobials:  S/p one dose ceftriaxone  Subjective: This morning breathing stable from yesterday, no chest pain, fever, or cough  Objective: Vitals:   04/03/22 0500 04/03/22 0530 04/03/22 0600 04/03/22 0628  BP: 107/68 95/71 105/69   Pulse: 89 (!) 104 (!) 103   Resp: (!) 23 16 (!) 26   Temp:    98.6 F (37 C)  TempSrc:      SpO2: 98% 100% 98%    Weight:      Height:        Intake/Output Summary (Last 24 hours) at 04/03/2022 0854 Last data filed at 04/02/2022 2200 Gross per 24 hour  Intake 1672.12 ml  Output --  Net 1672.12 ml   Filed Weights   04/02/22 1305  Weight: 90.7 kg    Examination:  General exam: Appears chronically ill Respiratory system: tachypnic, rales at bases Cardiovascular system: S1 & S2 heard, RRR. No JVD, murmurs, rubs, gallops or clicks.   Gastrointestinal system: Abdomen is obese, soft and nontender. No organomegaly or masses felt. Normal bowel sounds heard. Central nervous system: Alert and oriented. No focal neurological deficits. Extremities: Symmetric 5 x 5 power. 1+ LE edema Skin: No rashes  Psychiatry: Judgement and insight appear normal. Mood & affect appropriate.     Data Reviewed: I have personally reviewed following labs and imaging studies  CBC: Recent Labs  Lab 04/02/22 1345 04/03/22 0129 04/03/22 0421  WBC 7.0 5.0 5.0  NEUTROABS  --  4.5 4.2  HGB 16.0* 15.1* 15.4*  HCT 51.1* 48.4* 50.4*  MCV 109.2* 110.0* 111.5*  PLT 107* 94* 174*   Basic Metabolic Panel: Recent Labs  Lab 04/02/22 1345 04/03/22 0129 04/03/22 0421  NA 137 139 139  K 5.2* 4.4 4.2  CL 95* 97* 98  CO2 31 29 30   GLUCOSE 115* 210* 199*  BUN 29* 31* 30*  CREATININE 1.55* 1.63* 1.65*  CALCIUM 9.4 8.6* 8.7*   GFR: Estimated Creatinine Clearance: 25.9 mL/min (A) (by C-G formula based on SCr of 1.65 mg/dL (H)). Liver Function Tests: No results for input(s): "AST", "ALT", "ALKPHOS", "BILITOT", "PROT", "ALBUMIN" in the last 168 hours. No results for input(s): "LIPASE", "AMYLASE" in the last 168 hours. No results for input(s): "AMMONIA" in the last 168 hours. Coagulation Profile: No results for input(s): "INR", "PROTIME" in the last 168 hours. Cardiac Enzymes: No results for input(s): "CKTOTAL", "CKMB", "CKMBINDEX", "TROPONINI" in the last 168 hours. BNP (last 3 results) No results for input(s):  "PROBNP" in the last 8760 hours. HbA1C: No results for input(s): "HGBA1C" in the last 72 hours. CBG: No results for input(s): "GLUCAP" in the last 168 hours. Lipid Profile: No results for input(s): "CHOL", "HDL", "LDLCALC", "TRIG", "CHOLHDL", "LDLDIRECT" in the last 72 hours. Thyroid Function Tests: No results for input(s): "TSH", "T4TOTAL", "FREET4", "T3FREE", "THYROIDAB" in the last 72 hours. Anemia Panel: No results for input(s): "VITAMINB12", "FOLATE", "FERRITIN", "TIBC", "IRON", "RETICCTPCT" in the last 72 hours. Urine analysis:    Component Value Date/Time   COLORURINE YELLOW 11/20/2021 [place] 11/20/2021 1420   LABSPEC 1.006 11/20/2021 1420   PHURINE 6.0 11/20/2021 1420   GLUCOSEU NEGATIVE 11/20/2021 1420   HGBUR NEGATIVE 11/20/2021 1420   BILIRUBINUR NEGATIVE 07/15/2021 0644   KETONESUR NEGATIVE 11/20/2021 1420   PROTEINUR NEGATIVE 11/20/2021 1420   UROBILINOGEN 1.0 05/29/2009 2253   NITRITE NEGATIVE 11/20/2021 1420   LEUKOCYTESUR TRACE (A) 11/20/2021 1420   Sepsis Labs: @LABRCNTIP (procalcitonin:4,lacticidven:4)  )No results found for this or any previous visit (from the past 240 hour(s)).  Radiology Studies: US RENAL  Result Date: 04/02/2022 CLINICAL DATA:  Acute kidney injury EXAM: RENAL / URINARY TRACT ULTRASOUND COMPLETE COMPARISON:  CT 01/29/2022 FINDINGS: Right Kidney: Renal measurements: 10.3 by 4.6 x 4.4 cm = volume: 108 mL. Appears slightly echogenic. No mass or hydronephrosis. Left Kidney: Renal measurements: 10.8 x 5 x 4.1 cm = volume: 114 mL. Appears slightly echogenic. No mass or hydronephrosis Bladder: Appears normal for degree of bladder distention. Other: None. IMPRESSION: 1. Kidneys appear slightly echogenic suggesting medical renal disease. 2. Negative for hydronephrosis Electronically Signed   By: Donavan Foil M.D.   On: 04/02/2022 17:42   US Venous Img Lower Bilateral  Result Date: 04/02/2022 CLINICAL DATA:  Bilateral  lower extremity edema. EXAM: BILATERAL LOWER EXTREMITY VENOUS DOPPLER ULTRASOUND TECHNIQUE: Gray-scale sonography with graded compression, as well as color Doppler and duplex ultrasound were performed to evaluate the lower extremity deep venous systems from the level of the common femoral vein and including the common femoral, femoral, profunda femoral, popliteal and calf veins including the posterior tibial, peroneal and gastrocnemius veins when visible. The superficial great saphenous vein was also interrogated. Spectral Doppler was utilized to evaluate flow at rest and with distal augmentation maneuvers in the common femoral, femoral and popliteal veins. COMPARISON:  None Available. FINDINGS: RIGHT LOWER EXTREMITY Common Femoral Vein: No evidence of thrombus. Normal compressibility, respiratory phasicity and response to augmentation. Saphenofemoral Junction: No evidence of thrombus. Normal compressibility and flow on color Doppler imaging. Profunda Femoral Vein: No evidence of thrombus. Normal compressibility and flow on color Doppler imaging. Femoral Vein: No evidence of thrombus. Normal compressibility, respiratory phasicity and response to augmentation. Popliteal Vein: No evidence of thrombus. Normal compressibility, respiratory phasicity and response to augmentation. Calf Veins: No evidence of thrombus. Limited ability to compress calf veins. Superficial Great Saphenous Vein: No evidence of thrombus. Normal compressibility. Venous Reflux:  None. Other Findings: No evidence of superficial thrombophlebitis or abnormal fluid collection. LEFT LOWER EXTREMITY Common Femoral Vein: No evidence of thrombus. Normal compressibility, respiratory phasicity and response to augmentation. Saphenofemoral Junction: No evidence of thrombus. Normal compressibility and flow on color Doppler imaging. Profunda Femoral Vein: No evidence of thrombus. Normal compressibility and flow on color Doppler imaging. Femoral Vein: No evidence  of thrombus. Normal compressibility, respiratory phasicity and response to augmentation. Popliteal Vein: No evidence of thrombus. Normal compressibility, respiratory phasicity and response to augmentation. Calf Veins: No evidence of thrombus. Limited ability to compress calf veins. Superficial Great Saphenous Vein: No evidence of thrombus. Normal compressibility. Venous Reflux:  None. Other Findings: No evidence of superficial thrombophlebitis or abnormal fluid collection. IMPRESSION: No evidence of deep venous thrombosis in either lower extremity. Limited evaluation of bilateral calf veins due to limited ability to perform compression. Electronically Signed   By: Aletta Edouard M.D.   On: 04/02/2022 16:36   CT Angio Chest PE W and/or Wo Contrast  Result Date: 04/02/2022 CLINICAL DATA:  Shortness of breath for 4 days. EXAM: CT ANGIOGRAPHY CHEST WITH CONTRAST TECHNIQUE: Multidetector CT imaging of the chest was performed using the standard protocol during bolus administration of intravenous contrast. Multiplanar CT image reconstructions and MIPs were obtained to evaluate the vascular anatomy. RADIATION DOSE REDUCTION: This exam was performed according to the departmental dose-optimization program which includes automated exposure control, adjustment of the mA and/or kV according to patient size and/or use of iterative reconstruction technique. CONTRAST:  31mL OMNIPAQUE IOHEXOL 350 MG/ML SOLN COMPARISON:  01/29/2022 FINDINGS: Cardiovascular: The heart is mildly enlarged but stable. Stable  tortuosity, ectasia and calcification of the thoracic aorta. Stable large saccular aneurysm projecting off the descending thoracic aorta and measuring approximately 3.1 x 2.9 cm. This is stable. Stable three-vessel coronary artery calcifications. Reflux of contrast down the IVC and into the hepatic veins is likely due to tricuspid regurgitation. The pulmonary arterial tree is well opacified. No filling defects to suggest  pulmonary embolism. Mediastinum/Nodes: Stable scattered mediastinal and hilar lymph nodes but no mass or overt adenopathy. The esophagus is grossly normal. Lungs/Pleura: Moderate right-sided pleural effusion with overlying atelectasis. Very small left pleural effusion with left basilar atelectasis. Chronic emphysematous and pulmonary scarring changes. No pulmonary infiltrates or pulmonary edema. Upper Abdomen: No significant upper abdominal findings. Musculoskeletal: No breast masses axillary adenopathy. Stable right thyroid goiter. The bony thorax is intact. Stable osteoporosis and degenerative changes. Review of the MIP images confirms the above findings. IMPRESSION: 1. No CT findings for pulmonary embolism. 2. Stable tortuosity, ectasia and calcification of the thoracic aorta. Stable 3.4 cm saccular aneurysm projecting off the descending thoracic aorta. 3. Stable three-vessel coronary artery calcifications. 4. Moderate right-sided pleural effusion with overlying atelectasis. 5. Very small left pleural effusion with left basilar atelectasis. 6. Chronic emphysematous and pulmonary scarring changes. No significant acute overlying pulmonary process. 7. Aortic atherosclerosis. Aortic Atherosclerosis (ICD10-I70.0) and Emphysema (ICD10-J43.9). Electronically Signed   By: Marijo Sanes M.D.   On: 04/02/2022 15:40   DG Chest Port 1 View  Result Date: 04/02/2022 CLINICAL DATA:  Hypoxia, shortness of breath EXAM: PORTABLE CHEST 1 VIEW COMPARISON:  12/05/2021 FINDINGS: Small right pleural effusion. Cardiomegaly. Diffuse interstitial prominence, stable since prior study, likely reflecting chronic lung disease. Aortic atherosclerosis. No acute bony abnormality. IMPRESSION: Stable chronic cardiomegaly and chronic interstitial lung disease. Small right pleural effusion. Electronically Signed   By: Rolm Baptise M.D.   On: 04/02/2022 13:41        Scheduled Meds:  apixaban  2.5 mg Oral BID   citalopram  20 mg Oral  Daily   digoxin  0.0625 mg Oral Daily   gabapentin  100 mg Oral TID   ipratropium-albuterol  3 mL Nebulization Q6H   levothyroxine  88 mcg Oral Q0600   metoprolol tartrate  12.5 mg Oral BID   pantoprazole  40 mg Oral Daily   predniSONE  40 mg Oral Q breakfast   Continuous Infusions:   LOS: 1 day     Desma Maxim, MD [facility]   If 7PM-7AM, please contact night-coverage [email or web address] Password [facility] 04/03/2022, 8:54 AM

## 2022-04-03 NOTE — Plan of Care (Signed)
Consult for [place] noted. Patient is currently off unit at this time. Will follow up tomorrow.

## 2022-04-03 NOTE — Progress Notes (Signed)
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 Patient began experiencing worsening respiratory distress at her facility and was evaluated by [facility] nurse. Decision was made in part due to her code status as well as per request of patient to have her evaluated in ED. She is admitted with a diagnosis of acute on chronic respiratory failure. Per Dr. Cherie Ouch with [facility] this is a related hospice admission.   Visited at bedside with patient who was in the process of completing her breakfast and taking her medications. She was still in the ED at time of visit. Per report of bedside nurse her oxygen had to be raised up to 8L this am due to increased work of breathing. However she does report feeling mildly better at this time. She continues to be unable to complete 5-6 words without stopping to take a breath.   Patient is inpatient appropriate due to need for IV fluids, IV antibiotics and IV steroids.   V/S: 97.6/115/27   103/83   92% on 10L HFNC  I&O: Not yet recorded  Labs: Glucose 199, BUN 30, Creatinine 1.65, Ca+ 8.7, GFR 37, BNP 602.7, Troponin 42, Lactic Acid 4.1, Hgb 15.4, Hct 50.4, Platelets 100  Diagnostics:  PORTABLE CHEST 1 VIEW  COMPARISON:  12/05/2021 FINDINGS: Small right pleural effusion. Cardiomegaly. Diffuse interstitial prominence, stable since prior study, likely reflecting chronic lung disease. Aortic atherosclerosis. No acute bony abnormality.  IMPRESSION: Stable chronic cardiomegaly and chronic interstitial lung disease. Small right pleural effusion.  IV/PRN: Solu-Medrol 125mg  IVx1, NS @ 50cc/H, Zithromax 500mg  IVx2, Rocephin 2g IV x2, LR 100mg /H IV x2 liters, .    Problem List: Acute on chronic hypoxic respiratory failure # COPD Baseline at least 4 L need, here tachypnic on 8. Multifactorial, 2/2 copd, osa, dCHF, phtn. No PE on CT  imaging. Does have moderate pleural effusion. No fever or cough or abrupt changes to suggest copd exacerbation though course of steroids may help. No chest pain. Does not appear to be chf exacerbation. Followed by palliative as outpt - continue steroids, duonebs - continue oxygen - palliative consult - thoracentesis diagnostic/therapeutic ordered by admitting provider - f/u covid  D/C planning: Ongoing, likely back to ALF once stable Family: none per patient who reported she would relay information to her daughters IDT: Updated GOC: Ongoing, patient desires treatment for current exacerbation and limited code, wants CPR but would not want to be intubated.   Thank you, Jhonnie Garner, BSN, RN, [facility] hospital liaison [phone] or Epic chat

## 2022-04-03 NOTE — Care Management Important Message (Signed)
Important Message  Patient Details  Name: Korena Nass MRN: [id number] Date of Birth: [birth year]   Medicare Important Message Given:  N/A - LOS <3 / Initial given by admissions     Dannette Barbara 04/03/2022, 2:25 PM

## 2022-04-03 NOTE — Evaluation (Signed)
Occupational Therapy Evaluation Patient Details Name: [name] MRN: [id number] DOB: [birth year] Today's Date: 04/03/2022   History of Present Illness [name] is a 81 y.o. female with medical history significant of severe COPD c/b chronic hypoxic and hypercapnic respiratory failure on 4 L [place] O2, severe pulmonary hypertension, systolic CHF, atrial fibrillation on Eliquis, peripheral artery disease, NSCLC s/p XRT/chemotherapy in remission, OSA not on CPAP, who presents to the ED with shortness of breath.   Clinical Impression   Patient presenting with decreased independence in self-care, functional mobility, safety, and endurance. Patient sitting EOB upon arrival. Patient reports she lives at an assisted living facility and furniture walks around her room and uses her rollator to ambulate very short distances. Patient requires assists from staff for ADLs and IADLs, and to push her around sometimes in her rollator due to her shoulder pain . On 6L SP02 at baseline. Patient on 10L HFNC, which limits activity during session today. Patient currently functioning at min guard for transfers sit <> stand using RW. Patient was able to simulate toilet transfer, transferring to chair. Patient was able to take side steps to transfer to chair. Min A for bed mobility sit> supine. Patient left in bed with call bell in reach and all needs met. Patient will benefit from acute OT to increase overall independence in the areas of ADLs, functional mobility, in order to safely discharge home.       Recommendations for follow up therapy are one component of a multi-disciplinary discharge planning process, led by the attending physician.  Recommendations may be updated based on patient status, additional functional criteria and insurance authorization.   Follow Up Recommendations  Home health OT    Assistance Recommended at Discharge Frequent or constant Supervision/Assistance  Patient can return home with  the following A little help with walking and/or transfers;A little help with bathing/dressing/bathroom;Assist for transportation;Help with stairs or ramp for entrance    Functional Status Assessment  Patient has had a recent decline in their functional status and/or demonstrates limited ability to make significant improvements in function in a reasonable and predictable amount of time  Equipment Recommendations  Other (comment) (RW)    Recommendations for Other Services       Precautions / Restrictions Precautions Precautions: Fall Restrictions Weight Bearing Restrictions: No      Mobility Bed Mobility Overal bed mobility: Needs Assistance Bed Mobility: Sit to Supine       Sit to supine: Min assist        Transfers Overall transfer level: Needs assistance Equipment used: Rolling walker (2 wheels) Transfers: Sit to/from Stand Sit to Stand: Min guard           General transfer comment: VC's for hand placement.      Balance Overall balance assessment: Needs assistance Sitting-balance support: Feet unsupported, Bilateral upper extremity supported Sitting balance-Leahy Scale: Fair Sitting balance - Comments: maintains sitting balance with supervision   Standing balance support: Bilateral upper extremity supported, During functional activity Standing balance-Leahy Scale: Fair                             ADL either performed or assessed with clinical judgement   ADL Overall ADL's : Needs assistance/impaired                         Toilet Transfer: Chief Financial Officer Details (indicate cue type and reason): simulated  Vision Baseline Vision/History: 1 Wears glasses Patient Visual Report: No change from baseline       Perception     Praxis      Pertinent Vitals/Pain Pain Assessment Pain Assessment: No/denies pain     Hand Dominance Right   Extremity/Trunk Assessment Upper  Extremity Assessment Upper Extremity Assessment: Generalized weakness   Lower Extremity Assessment Lower Extremity Assessment: Generalized weakness   Cervical / Trunk Assessment Cervical / Trunk Assessment: Normal   Communication Communication Communication: No difficulties   Cognition Arousal/Alertness: Awake/alert Behavior During Therapy: WFL for tasks assessed/performed Overall Cognitive Status: Within Functional Limits for tasks assessed                                       General Comments  10 L/min HFNC with desaturation to 86% side stepping at EOB with HR in 120's. Seated rest leads to improvement of O2 to >90%    Exercises     Shoulder Instructions      Home Living Family/patient expects to be discharged to:: Assisted living                             Home Equipment: Toilet riser;Grab bars - tub/shower;Shower seat - built in;Rollator (4 wheels)          Prior Functioning/Environment Prior Level of Function : Needs assist       Physical Assist : ADLs (physical)   ADLs (physical): IADLs Mobility Comments: Furniture walker in room. Uses Rollator intermittently. ADLs Comments: Staff PRN assist with bathing, medication and meals. Some dressing.        OT Problem List: Decreased strength;Decreased activity tolerance;Decreased safety awareness      OT Treatment/Interventions: Self-care/ADL training;Energy conservation;Therapeutic exercise;Therapeutic activities    OT Goals(Current goals can be found in the care plan section) Acute Rehab OT Goals Patient Stated Goal: to return home. OT Goal Formulation: With patient Time For Goal Achievement: 04/17/22 Potential to Achieve Goals: Fair ADL Goals Pt Will Perform Lower Body Bathing: with adaptive equipment;with min assist Pt Will Perform Lower Body Dressing: with supervision;with min assist Pt Will Transfer to Toilet: with modified independence Pt Will Perform Toileting -  Clothing Manipulation and hygiene: with modified independence  OT Frequency: Min 2X/week    Co-evaluation              AM-PAC OT "6 Clicks" Daily Activity     Outcome Measure Help from another person eating meals?: None Help from another person taking care of personal grooming?: None Help from another person toileting, which includes using toliet, bedpan, or urinal?: A Little Help from another person bathing (including washing, rinsing, drying)?: A Little Help from another person to put on and taking off regular upper body clothing?: None Help from another person to put on and taking off regular lower body clothing?: A Little 6 Click Score: 21   End of Session Equipment Utilized During Treatment: Rolling walker (2 wheels) Nurse Communication: Mobility status  Activity Tolerance: Patient tolerated treatment well Patient left: in bed;with call bell/phone within reach  OT Visit Diagnosis: Unsteadiness on feet (R26.81);Muscle weakness (generalized) (M62.81)                Time: 2458-0998 OT Time Calculation (min): 26 min Charges:  OT General Charges $OT Visit: 1 Visit OT Evaluation $OT Eval Low Complexity: 1 Low OT Treatments $  Therapeutic Activity: 8-22 mins   Mariette Cowley, OTS 04/03/2022, 1:43 PM

## 2022-04-03 NOTE — Consult Note (Signed)
ANTICOAGULATION CONSULT NOTE - Initial Consult  Pharmacy Consult for apixaban Indication: atrial fibrillation  No Known Allergies  Patient Measurements: Height: 5' (152.4 cm) Weight: 90.7 kg (200 lb) IBW/kg (Calculated) : 45.5  Vital Signs: Temp: 98.6 F (37 C) (10/12 0628) Temp Source: Oral (10/12 0130) BP: 105/69 (10/12 0600) Pulse Rate: 103 (10/12 0600)  Labs: Recent Labs    04/02/22 1345 04/02/22 2057 04/03/22 0129 04/03/22 0421  HGB 16.0*  --  15.1* 15.4*  HCT 51.1*  --  48.4* 50.4*  PLT 107*  --  94* 100*  CREATININE 1.55*  --  1.63* 1.65*  TROPONINIHS 56* 42*  --   --     Estimated Creatinine Clearance: 25.9 mL/min (A) (by C-G formula based on SCr of 1.65 mg/dL (H)).   Medical History: Past Medical History:  Diagnosis Date   AAA (abdominal aortic aneurysm) (HCC)    Anticoagulant long-term use    Failed on Coumadin. On Xarelto   Anxiety    Anxiety and depression    Atrial fib/flutter, transient June 2012   Chronic lower back pain    Chronic respiratory failure with hypoxia ([place]) 12/03/2014   COPD (chronic obstructive pulmonary disease) (HCC)    DVT (deep venous thrombosis) ([place]) 2004   BLE   Esophageal dysmotility    Exertional dyspnea    Hiatal hernia    History of bronchitis    History of fibrocystic disease of breast    History of uterine fibroid    HTN (hypertension)    Hyperlipidemia    Hypothyroidism    Normal nuclear stress test 2012   May 2012   OA (osteoarthritis)    "knees; left shoulder"   OSA (obstructive sleep apnea)    "haven't been using my CPAP lately" (01/21/12)   Ovarian mass    right benign   Ovarian mass    benign, right   PAD (peripheral artery disease) ([place])    Small cell carcinoma of lung ([place]) 2004   NON-SMALL CELL CARCINOMA OF THE LUNG, METASTATIC TO THE SUPRACLAVICULAR AND MEDIASTINAL LYMPH NODES; in remission    Medications:  (Not in a hospital admission)  Scheduled:   apixaban  2.5 mg Oral BID    cholecalciferol  1,000 Units Oral Daily   citalopram  20 mg Oral Daily   digoxin  0.0625 mg Oral Daily   feeding supplement  237 mL Oral BID BM   gabapentin  100 mg Oral TID   ipratropium-albuterol  3 mL Nebulization Q6H   ipratropium-albuterol  3 mL Nebulization BID   levothyroxine  88 mcg Oral Q0600   melatonin  5 mg Oral QHS   metoprolol tartrate  12.5 mg Oral BID   multivitamin with minerals  1 tablet Oral Daily   pantoprazole  40 mg Oral Daily   predniSONE  40 mg Oral Q breakfast   vitamin B-12  100 mcg Oral Daily   Infusions:   sodium chloride     PRN: acetaminophen, albuterol, morphine, polyethylene glycol Anti-infectives (From admission, onward)    Start     Dose/Rate Route Frequency Ordered Stop   04/02/22 1415  cefTRIAXone (ROCEPHIN) 2 g in sodium chloride 0.9 % 100 mL IVPB  Status:  Discontinued        2 g 200 mL/hr over 30 Minutes Intravenous Every 24 hours 04/02/22 1400 04/02/22 1657   04/02/22 1415  azithromycin (ZITHROMAX) 500 mg in sodium chloride 0.9 % 250 mL IVPB  Status:  Discontinued  500 mg 250 mL/hr over 60 Minutes Intravenous Every 24 hours 04/02/22 1400 04/02/22 1657       Assessment: Pharmacy consulted to start apixaban for afib.   Goal of Therapy:  Monitor platelets by anticoagulation protocol: Yes   Plan:  Apixaban 2.5 mg BID. Pt meets dose reduction criteria (Scr > 1.5 and age > 32)  Oswald Hillock, PharmD, BCPS 04/03/2022,9:41 AM

## 2022-04-03 NOTE — ED Notes (Signed)
Pt's brief and chucks changed.  New blankets given and pt repositioned.  Pt comfortable and no other needs at this time.

## 2022-04-03 NOTE — Procedures (Signed)
PROCEDURE SUMMARY:  Successful US guided right thoracentesis. Yielded 500 mL of amber, clear fluid. Pt tolerated procedure well. No immediate complications.  Specimen was sent for labs. CXR ordered.  EBL < 5 mL  Docia Barrier PA-C 04/03/2022 3:36 PM

## 2022-04-03 NOTE — Evaluation (Signed)
Physical Therapy Evaluation Patient Details Name: [name] MRN: [id number] DOB: 1941-01-24 Today's Date: 04/03/2022  History of Present Illness  [name] is a 81 y.o. female with medical history significant of severe COPD c/b chronic hypoxic and hypercapnic respiratory failure on 4 L [place] O2, severe pulmonary hypertension, systolic CHF, atrial fibrillation on Eliquis, peripheral artery disease, NSCLC s/p XRT/chemotherapy in remission, OSA not on CPAP, who presents to the ED with shortness of breath.   Clinical Impression  Pt admitted with above diagnosis. Pt received seated EOB agreeable to PT services. Pt reports at baseline is primarily a furniture walker in her room but does use rollator for short distances. Relies on staff for some ADL's/IADL's. To date pt is on HFNC at 10 L/min thus limiting mobility of session and eval. Pt able to stand minguard to RW and side step R/L with SBA/supervision with good stability. Desaturation occurs and HR elevates to mid 120's with notable SOB. Pt returning to sitting. Education provided on LE therex and transfers with nursing staff to maintain LE strength and independence in functional mobility. All needs in reach in sitting EOB. Anticipate once breathing improves, pt will be able to d/c back to ALF with assistance from staff. Pt currently with functional limitations due to the deficits listed below (see PT Problem List). Pt will benefit from skilled PT to increase their independence and safety with mobility to allow discharge to the venue listed below.       Recommendations for follow up therapy are one component of a multi-disciplinary discharge planning process, led by the attending physician.  Recommendations may be updated based on patient status, additional functional criteria and insurance authorization.  Follow Up Recommendations Home health PT      Assistance Recommended at Discharge Frequent or constant Supervision/Assistance  Patient  can return home with the following  A little help with walking and/or transfers;A little help with bathing/dressing/bathroom;Assistance with cooking/housework;Assist for transportation;Help with stairs or ramp for entrance    Equipment Recommendations Rolling walker (2 wheels)  Recommendations for Other Services       Functional Status Assessment Patient has had a recent decline in their functional status and demonstrates the ability to make significant improvements in function in a reasonable and predictable amount of time.     Precautions / Restrictions Precautions Precautions: Fall Restrictions Weight Bearing Restrictions: No      Mobility  Bed Mobility               General bed mobility comments: NT. Seated EOB pre and post eval. Patient Response: Cooperative  Transfers Overall transfer level: Needs assistance Equipment used: Rolling walker (2 wheels) Transfers: Sit to/from Stand Sit to Stand: Min guard           General transfer comment: VC's for hand placement.    Ambulation/Gait Ambulation/Gait assistance: Supervision Gait Distance (Feet): 4 Feet Assistive device: Rolling walker (2 wheels) Gait Pattern/deviations: Step-to pattern       General Gait Details: Limited in side steps at EOB due to HFNC tubing and inability to use portable O2. Excellent stability and use of RW at EOB.  Stairs            Wheelchair Mobility    Modified Rankin (Stroke Patients Only)       Balance Overall balance assessment: Needs assistance Sitting-balance support: Feet unsupported, Bilateral upper extremity supported Sitting balance-Leahy Scale: Fair Sitting balance - Comments: maintains sitting balance with supervision   Standing balance support: Bilateral upper  extremity supported, During functional activity Standing balance-Leahy Scale: Fair Standing balance comment: light use of RW                             Pertinent Vitals/Pain Pain  Assessment Pain Assessment: No/denies pain    Home Living Family/patient expects to be discharged to:: Assisted living                 Home Equipment: Toilet riser;Grab bars - tub/shower;Grab bars - toilet;Shower seat - built in;Rollator (4 wheels)      Prior Function Prior Level of Function : Needs assist       Physical Assist : ADLs (physical)     Mobility Comments: Furniture walker in room. Uses Rollator intermittently. ADLs Comments: Staff PRN assist with bathing, medication and meals. Some dressing.     Hand Dominance        Extremity/Trunk Assessment   Upper Extremity Assessment Upper Extremity Assessment: Defer to OT evaluation    Lower Extremity Assessment Lower Extremity Assessment: Generalized weakness    Cervical / Trunk Assessment Cervical / Trunk Assessment: Normal  Communication   Communication: No difficulties  Cognition Arousal/Alertness: Awake/alert Behavior During Therapy: WFL for tasks assessed/performed Overall Cognitive Status: Within Functional Limits for tasks assessed                                          General Comments General comments (skin integrity, edema, etc.): 10 L/min HFNC with desaturation to 86% side stepping at EOB with HR in 120's. Seated rest leads to improvement of O2 to >90%    Exercises General Exercises - Lower Extremity Ankle Circles/Pumps: AROM, Both, 5 reps, Seated Long Arc Quad: AROM, Strengthening, Both, 5 reps, Seated Hip Flexion/Marching: AROM, Strengthening, Both, 5 reps, Seated Other Exercises Other Exercises: Role of PT in acute setting, d/c recs, LE therex and transfers to maintain LE strength and independence with functional mobility.   Assessment/Plan    PT Assessment Patient needs continued PT services  PT Problem List Decreased strength;Decreased mobility;Decreased activity tolerance;Cardiopulmonary status limiting activity       PT Treatment Interventions DME  instruction;Therapeutic exercise;Gait training;Balance training;Neuromuscular re-education;Functional mobility training;Therapeutic activities;Patient/family education    PT Goals (Current goals can be found in the Care Plan section)  Acute Rehab PT Goals Patient Stated Goal: improve leg strength, return to facility PT Goal Formulation: With patient Time For Goal Achievement: 04/17/22 Potential to Achieve Goals: Good    Frequency Min 2X/week     Co-evaluation               AM-PAC PT "6 Clicks" Mobility  Outcome Measure Help needed turning from your back to your side while in a flat bed without using bedrails?: A Little Help needed moving from lying on your back to sitting on the side of a flat bed without using bedrails?: A Little Help needed moving to and from a bed to a chair (including a wheelchair)?: A Little Help needed standing up from a chair using your arms (e.g., wheelchair or bedside chair)?: A Little Help needed to walk in hospital room?: A Lot Help needed climbing 3-5 steps with a railing? : A Lot 6 Click Score: 16    End of Session Equipment Utilized During Treatment: Gait belt;Oxygen Activity Tolerance: Other (comment);Patient tolerated treatment well (limited due to high O2  demands.) Patient left: in bed;with call bell/phone within reach Nurse Communication: Mobility status PT Visit Diagnosis: Other abnormalities of gait and mobility (R26.89);Muscle weakness (generalized) (M62.81)    Time: 0109-3235 PT Time Calculation (min) (ACUTE ONLY): 18 min   Charges:   PT Evaluation $PT Eval Moderate Complexity: [place] M. Fairly IV, PT, DPT Physical Therapist- [facility]  04/03/2022, 10:05 AM

## 2022-04-04 DIAGNOSIS — J9621 Acute and chronic respiratory failure with hypoxia: Secondary | ICD-10-CM | POA: Diagnosis not present

## 2022-04-04 DIAGNOSIS — Z7189 Other specified counseling: Secondary | ICD-10-CM

## 2022-04-04 LAB — BASIC METABOLIC PANEL
Anion gap: 7 (ref 5–15)
BUN: 34 mg/dL — ABNORMAL HIGH (ref 8–23)
CO2: 32 mmol/L (ref 22–32)
Calcium: 9.2 mg/dL (ref 8.9–10.3)
Chloride: 100 mmol/L (ref 98–111)
Creatinine, Ser: 1.09 mg/dL — ABNORMAL HIGH (ref 0.44–1.00)
GFR, Estimated: 50 mL/min — ABNORMAL LOW (ref >60)
Glucose, Bld: 140 mg/dL — ABNORMAL HIGH (ref 70–99)
Potassium: 4.5 mmol/L (ref 3.5–5.1)
Sodium: 139 mmol/L (ref 135–145)

## 2022-04-04 LAB — CBC
HCT: 45 % (ref 36.0–46.0)
Hemoglobin: 14.3 g/dL (ref 12.0–15.0)
MCH: 34 pg (ref 26.0–34.0)
MCHC: 31.8 g/dL (ref 30.0–36.0)
MCV: 107.1 fL — ABNORMAL HIGH (ref 80.0–100.0)
Platelets: 101 10*3/uL — ABNORMAL LOW (ref 150–400)
RBC: 4.2 MIL/uL (ref 3.87–5.11)
RDW: 14.1 % (ref 11.5–15.5)
WBC: 9.2 10*3/uL (ref 4.0–10.5)
nRBC: 0 % (ref 0.0–0.2)

## 2022-04-04 LAB — PHOSPHORUS: Phosphorus: 4 mg/dL (ref 2.5–4.6)

## 2022-04-04 LAB — MAGNESIUM: Magnesium: 2.3 mg/dL (ref 1.7–2.4)

## 2022-04-04 MED ORDER — BOOST / RESOURCE BREEZE PO LIQD CUSTOM
1.0000 | Freq: Two times a day (BID) | ORAL | Status: DC
  Administered 2022-04-04 – 2022-04-18 (×24): 1 via ORAL

## 2022-04-04 MED ORDER — PREDNISONE 20 MG PO TABS
20.0000 mg | ORAL_TABLET | Freq: Every day | ORAL | Status: AC
  Administered 2022-04-05 – 2022-04-06 (×2): 20 mg via ORAL
  Filled 2022-04-04 (×2): qty 1

## 2022-04-04 MED ORDER — METOPROLOL TARTRATE 25 MG PO TABS
12.5000 mg | ORAL_TABLET | Freq: Once | ORAL | Status: AC
  Administered 2022-04-04: 12.5 mg via ORAL
  Filled 2022-04-04: qty 1

## 2022-04-04 MED ORDER — METOPROLOL TARTRATE 25 MG PO TABS
25.0000 mg | ORAL_TABLET | Freq: Two times a day (BID) | ORAL | Status: DC
  Administered 2022-04-04 – 2022-04-07 (×6): 25 mg via ORAL
  Filled 2022-04-04 (×6): qty 1

## 2022-04-04 MED ORDER — IPRATROPIUM-ALBUTEROL 0.5-2.5 (3) MG/3ML IN SOLN
3.0000 mL | Freq: Two times a day (BID) | RESPIRATORY_TRACT | Status: DC
  Administered 2022-04-05 – 2022-04-18 (×27): 3 mL via RESPIRATORY_TRACT
  Filled 2022-04-04 (×26): qty 3

## 2022-04-04 NOTE — Progress Notes (Signed)
PT Cancellation Note  Patient Details Name: [name] MRN: [id number] DOB: 1941-05-04   Cancelled Treatment:     Therapist in to see pt just after lunch. Pt politely declining stating she did not feel up to PT services at time of visit. Will continue per POC next available date/time.   Josie Dixon 04/04/2022, 1:34 PM

## 2022-04-04 NOTE — Progress Notes (Signed)
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 Patient began experiencing worsening respiratory distress at her facility and was evaluated by [facility] nurse. Decision was made in part due to her code status as well as per request of patient to have her evaluated in ED. She is admitted with a diagnosis of acute on chronic respiratory failure. Per Dr. Cherie Ouch with [facility] this is a related hospice admission.   Visited at bedside with patient who was sleeping on arrival. She aroused easily and reported that she felt some better today after having fluid drawn yesterday evening. She discusses that she continues to want to treat the treatable while she is in the hospital and asks that contact be made with her daughter [name]. Her wish is to return to [place].   Patient is inpatient appropriate due to need for IV fluids, and attempting to wean down high O2 demand   V/S: 97.6/112/20    108/75    96% on 8L HFNC  I&O: 0/500  Labs: Glucose 140, BUN 34, Creatinine 1.09, GFR 50  Diagnostics:    IV/PRN: LR @ 100cc/H, stopped at 4:30am, NS @ 50cc/H, stopped at 9am.   Problem List: # Acute on chronic hypoxic respiratory failure # COPD # Pleural effusion Baseline at least 4 L need, here tachypnic on 8. Multifactorial, 2/2 copd, osa, dCHF, phtn. No PE on CT imaging. Does have moderate pleural effusion. No fever or cough or abrupt changes to suggest copd exacerbation though course of steroids may help. No chest pain. Does not appear to be chf exacerbation. 500 cc thoracentesis 10/12, transudate. Covid negative. Followed by palliative as outpt. Pulm had advised 6 L at last visit. Pulm consulted, advises palliative, says nothing more to be done - wean steroids, duonebs - continue oxygen - palliative consult - f/u pleural fluid cytology and culture   # AKI  Cr 1.6 from  baseline of around 1. Does not appear to be significantly fluid overloaded. Improved with holding home torsemide and with gentle hydration - continue to hold torsemide - stop fluids - monitor   # A-fib Here HR somewhat elevated - cont home apixaban, digoxin - f/u dig level - increase metop to 25 bid   D/C planning: Ongoing, likely back to ALF once stable Family: Talked with daughters by phone, their goal is for patient to stabilize and return to ALF with hospice IDT: Updated GOC: Ongoing, patient desires treatment for current exacerbation and limited code, wants CPR but would not want to be intubated.   Thank you, [name], BSN, RN, [facility] hospital liaison [phone] or Epic chat

## 2022-04-04 NOTE — Plan of Care (Signed)

## 2022-04-04 NOTE — TOC Progression Note (Signed)
Transition of Care [facility]) - Progression Note    Patient Details  Name: [name] MRN: [id number] Date of Birth: [birth year]  Transition of Care [facility]) CM/SW [place], [place] Phone Number: 04/04/2022, 12:53 PM  Clinical Narrative:      CSW spoke with Tiffany with [name] who reports they are concerned with meeting patient's needs due to her deterioration, reports they will be making a call to patient's family to discuss. They report they feel their med techs are not qualified to continue to assist with patient's high O2 needs. Reports they are unable to take her back with the 10L HFN that patient is currently on. Reports they typically accept patient between 4-6L. Although patient is an [facility] hospice patient, per facility she continues to be full code and they continue to have to send her back to the hospital .  Facility will talk with family and call this CSW back.       Expected Discharge Plan and Services                                                 Social Determinants of Health (SDOH) Interventions    Readmission Risk Interventions     No data to display

## 2022-04-04 NOTE — Progress Notes (Addendum)
Daily Progress Note   Patient Name: [name]       Date: 04/04/2022 DOB: Mar 09, 1941  Age: 81 y.o. MRN#: [id number] Attending Physician: Gwynne Edinger, MD Primary Care Physician: Jinny Sanders, MD Admit Date: 04/02/2022  Reason for Consultation/Follow-up: Establishing goals of care  Subjective: Notes and labs reviewed.  Patient states she is divorced and has children.  She states one of her daughters live area and the other lives in [place]. She states that she lives in a senior living center.  She tells me she spends a lot of time in her room, but enjoys playing bingo.  She tells me that she is expecting babies to be born into her family and December and in March.  She states that she wall walks when she is in her apartment, and has a walker for when she is mobile outside of this.  We discussed her diagnoses, prognosis, GOC, EOL wishes disposition and options.  Created space and opportunity for patient  to explore thoughts and feelings regarding current medical information.   A detailed discussion was had today regarding advanced directives.  Concepts specific to code status, artifical feeding and hydration, IV antibiotics and rehospitalization were discussed.  The difference between an aggressive medical intervention path and a comfort care path was discussed.  Values and goals of care important to patient and family were attempted to be elicited.  Discussed limitations of medical interventions to prolong quality of life in some situations and discussed the concept of human mortality.  She states that she is not yet ready to die and wants to continue doing what she can do to live as long as possible with a limit of being placed on a ventilator.  Patient states she would be  amenable to a Pleurx catheter.  She states that she and her family decided on hospice care as they were advised that hospice would have more resources available to them then palliative.  She initially states that she would not want to be placed on a ventilator, but would want CPR.  We discussed multiple different scenarios, and patient states that she no longer wants CPR as she would not want her ribs broken, and would be concerned with her status and quality of life on the other side of CPR if she was successfully resuscitated.  She discusses talking with her daughters about this.  Discussed follow-up next week if patient is still admitted, and patient states that she would like to follow-up with me personally.  Discussed that I would return on Tuesday.    Length of Stay: 2  Current Medications: Scheduled Meds:   apixaban  2.5 mg Oral BID   cholecalciferol  1,000 Units Oral Daily   citalopram  20 mg Oral Daily   digoxin  0.0625 mg Oral Daily   feeding supplement  1 Container Oral BID BM   gabapentin  100 mg Oral TID   ipratropium-albuterol  3 mL Nebulization Q6H   levothyroxine  88 mcg Oral Q0600   melatonin  5 mg Oral QHS   metoprolol tartrate  12.5 mg Oral Once   metoprolol tartrate  25 mg Oral BID   multivitamin with minerals  1 tablet Oral Daily   pantoprazole  40 mg Oral Daily   [START ON 04/05/2022] predniSONE  20 mg Oral Q breakfast   vitamin B-12  100 mcg Oral Daily    Continuous Infusions:   PRN Meds: acetaminophen, albuterol, morphine, polyethylene glycol  Physical Exam Pulmonary:     Effort: Pulmonary effort is normal.  Neurological:     Mental Status: She is alert.             Vital Signs: BP 108/75 (BP Location: Left Arm)   Pulse (!) 108   Temp 97.6 F (36.4 C) (Axillary)   Resp 20   Ht 5' (1.524 m)   Wt 90.7 kg   SpO2 96%   BMI 39.06 kg/m  SpO2: SpO2: 96 % O2 Device: O2 Device: High Flow Nasal Cannula O2 Flow Rate: O2 Flow Rate (L/min): 8  L/min  Intake/output summary:  Intake/Output Summary (Last 24 hours) at 04/04/2022 1513 Last data filed at 04/04/2022 0840 Gross per 24 hour  Intake --  Output 700 ml  Net -700 ml   LBM:   Baseline Weight: Weight: 90.7 kg Most recent weight: Weight: 90.7 kg    Patient Active Problem List   Diagnosis Date Noted   Pulmonary hypertension ([place]) 04/02/2022   AKI (acute kidney injury) ([place]) 04/02/2022   Hyperkalemia 04/02/2022   Pleural effusion on right 04/02/2022   Demand ischemia 04/02/2022   Aneurysm of thoracic aorta ([place]) 01/12/2022   Vaginal bleeding 12/23/2021   Thrombocytopenia ([place]) 12/04/2021   Permanent atrial fibrillation ([place])    Goals of care, counseling/discussion 12/03/2021   Acute on chronic heart failure with preserved ejection fraction (HFpEF) ([place]) 12/02/2021   Chronic low back pain 12/02/2021   Obesity (BMI 30-39.9) 12/02/2021   Anxiety and depression    Osteoarthritis of left shoulder 08/30/2021   Falls 07/15/2021   Acute on chronic respiratory failure with hypoxia ([place]) 05/04/2021   Frequent UTI    Anticoagulant long-term use    Physical deconditioning 85/46/2703   Chronic systolic CHF (congestive heart failure) ([place]) 09/15/2016   Hyperlipidemia    Chronic respiratory failure with hypoxia ([place]) 12/03/2014   Multiple thyroid nodules 10/17/2012   AAA (abdominal aortic aneurysm) ([place]) 05/11/2012   Bradycardia 01/23/2012   Hypothyroid 01/21/2012   HTN (hypertension) 01/21/2012   Obstructive sleep apnea 10/16/2008   History of DVT (deep vein thrombosis) 08/19/2008   COPD (chronic obstructive pulmonary disease) ([place]) 08/19/2008   NEOPLASM, MALIGNANT, LUNG, HX OF 50/02/3817   Diastolic heart failure, NYHA class 2 ([place]) 08/03/2008   Hyperbilirubinemia 2004    Palliative Care Assessment & Plan  Recommendations/Plan: Patient discusses that she does not believe that she would want CPR any longer.  She will discuss this with her daughters and then  with the attending if she would like to change her CODE STATUS.  No ventilator. She states that she is not ready to leave this earth, and would like to do what she can to live as long as possible, except ventilator support.  She would be amenable to Pleurx catheter. Overall, if after patient talks to her daughters and changes are made per our discussion today, she would want to treat the treatable with DNR/DNI status. Given her wishes to have life-prolonging care,  would recommend outpatient palliative.  Discussed with [facility] liaison and with primary MD.  Code Status:    Code Status Orders  (From admission, onward)           Start     Ordered   04/02/22 1655  Limited resuscitation (code)  Continuous       Question Answer Comment  In the event of cardiac or respiratory ARREST: Initiate Code Blue, Call Rapid Response Yes   In the event of cardiac or respiratory ARREST: Perform CPR Yes   In the event of cardiac or respiratory ARREST: Perform Intubation/Mechanical Ventilation No   In the event of cardiac or respiratory ARREST: Use NIPPV/BiPAp only if indicated Yes   In the event of cardiac or respiratory ARREST: Administer ACLS medications if indicated Yes   In the event of cardiac or respiratory ARREST: Perform Defibrillation or Cardioversion if indicated Yes      04/02/22 1655           Code Status History     Date Active Date Inactive Code Status Order ID Comments User Context   12/02/2021 1526 12/09/2021 2146 Full Code [id number]  Collier Bullock, MD ED   07/15/2021 0820 07/16/2021 2032 Full Code [id number]  Collier Bullock, MD ED   06/17/2021 2055 06/26/2021 0350 DNR [id number]  Para Skeans, MD ED   05/04/2021 2218 05/09/2021 2227 Full Code [id number]  Athena Masse, MD ED   09/15/2016 2045 09/28/2016 1707 Full Code [id number]  [place], Peter M, MD Inpatient       Care plan was discussed with attending and the [place] liaison.  Thank you for allowing the Palliative  Medicine Team to assist in the care of this patient.    Asencion Gowda, NP  Please contact Palliative Medicine Team phone at [phone] for questions and concerns.

## 2022-04-04 NOTE — Progress Notes (Addendum)
A&Ox4, intermittent confusion/forgetfulness overnight. Pt states body temperature runs low around 97 at baseline. Pulse ox sites rotated. Pt states history of sleep apnea - o2 sats fluctuated throughout the night on HFNC.   0530 - pt on HFNC 10L, sats in 90's. Pt had refused abg collection, education given x2 from RT & RN, OK from overnight provider.

## 2022-04-04 NOTE — Progress Notes (Signed)
PROGRESS NOTE    Svetlana Bagby  LOV:[id number] DOB: 05-18-1941 DOA: 04/02/2022 PCP: Jinny Sanders, MD  Outpatient Specialists: pulmonology, cardiology    Brief Narrative:   From admission h and p Tiarna Koppen is a 81 y.o. female with medical history significant of severe COPD c/b chronic hypoxic and hypercapnic respiratory failure on 4 L [place] O2, severe pulmonary hypertension, systolic CHF, atrial fibrillation on Eliquis, peripheral artery disease, NSCLC s/p XRT/chemotherapy in remission, OSA not on CPAP, who presents to the ED with shortness of breath.   Ms. Wilmot states she has been experiencing a gradual decline over the last 6 months and has noticed she has become increasingly weak due to sedentary lifestyle at her ALF.  During this time, she has experienced increased dyspnea on exertion.  She has noticed a marked worsening in her work of breathing over the last week ago.  She endorses increased shortness of breath only, denying any cough or sputum production.  She denies any fever, chills, congestion, recent nausea or vomiting, diarrhea, abdominal pain, urinary or bowel changes, or focal weakness.   Per chart review, patient followed up with Dr. [name] at [facility] pulmonology on 9/19.  At that time, she was saturating at 78% on 4 L nasal cannula O2.  She was increased to 6 L at that time with improvement in saturation to 89% she was in atrial fibrillation with RVR with blood pressure of 95/58.  At that time, she was suspected to be experiencing acute on chronic CHF.  However patient declined evaluation in the ED.  It was recommended at that time that her baseline O2 be increased to 6 L; unclear if this change was made at patient's ALF.  Assessment & Plan:   Principal Problem:   Acute on chronic respiratory failure with hypoxia (HCC) Active Problems:   COPD (chronic obstructive pulmonary disease) (HCC)   Pleural effusion on right   Hypothyroid   AKI (acute kidney injury)  ([place])   Obstructive sleep apnea   Chronic systolic CHF (congestive heart failure) (HCC)   Pulmonary hypertension (HCC)   Hyperkalemia   Demand ischemia   Permanent atrial fibrillation (HCC)   Physical deconditioning   Thrombocytopenia (HCC)   HTN (hypertension)  # Acute on chronic hypoxic respiratory failure # COPD # Pleural effusion Baseline at least 4 L need, here tachypnic on 8. Multifactorial, 2/2 copd, osa, dCHF, phtn. No PE on CT imaging. Does have moderate pleural effusion. No fever or cough or abrupt changes to suggest copd exacerbation though course of steroids may help. No chest pain. Does not appear to be chf exacerbation. 500 cc thoracentesis 10/12, transudate. Covid negative. Followed by palliative as outpt. Pulm had advised 6 L at last visit. Pulm consulted, advises palliative, says nothing more to be done - wean steroids, duonebs - continue oxygen - palliative consult - f/u pleural fluid cytology and culture  # Debility Resides in ALF - PT/OT advising HH  # AKI  Cr 1.6 from baseline of around 1. Does not appear to be significantly fluid overloaded. Improved with holding home torsemide and with gentle hydration - continue to hold torsemide - stop fluids - monitor  # HFpEF Appears relatively compensated - hold home torsemide  # A-fib Here HR somewhat elevated - cont home apixaban, digoxin - f/u dig level - increase metop to 25 bid  # Hypothyroid - cont home synthroid   DVT prophylaxis: apixaban Code Status: partial (no intubation) Family Communication: daughters updated telephonically 10/13  Level of care: Progressive Status is: Inpatient Remains inpatient appropriate because: severity of illness    Consultants:  pulmonology  Procedures: [place] pending  Antimicrobials:  S/p one dose ceftriaxone    Subjective: This morning breathing stable from yesterday, no chest pain, fever, or cough  Objective: Vitals:   04/04/22 0600 04/04/22 0800  04/04/22 1053 04/04/22 1122  BP:  115/74  108/75  Pulse: (!) 122 71 (!) 117 (!) 108  Resp: (!) 25 16  20   Temp:  97.6 F (36.4 C)  97.6 F (36.4 C)  TempSrc:  Oral  Axillary  SpO2: (!) 87% 93%  97%  Weight:      Height:        Intake/Output Summary (Last 24 hours) at 04/04/2022 1247 Last data filed at 04/04/2022 0840 Gross per 24 hour  Intake 128.93 ml  Output 700 ml  Net -571.07 ml   Filed Weights   04/02/22 1305  Weight: 90.7 kg    Examination:  General exam: Appears chronically ill Respiratory system: tachypnic, rales at bases Cardiovascular system: S1 & S2 heard, RRR. No JVD, murmurs, rubs, gallops or clicks.   Gastrointestinal system: Abdomen is obese, soft and nontender. No organomegaly or masses felt. Normal bowel sounds heard. Central nervous system: Alert and oriented. No focal neurological deficits. Extremities: Symmetric 5 x 5 power. 1+ LE edema Skin: No rashes  Psychiatry: Judgement and insight appear normal. Mood & affect appropriate.     Data Reviewed: I have personally reviewed following labs and imaging studies  CBC: Recent Labs  Lab 04/02/22 1345 04/03/22 0129 04/03/22 0421 04/04/22 0616  WBC 7.0 5.0 5.0 9.2  NEUTROABS  --  4.5 4.2  --   HGB 16.0* 15.1* 15.4* 14.3  HCT 51.1* 48.4* 50.4* 45.0  MCV 109.2* 110.0* 111.5* 107.1*  PLT 107* 94* 100* 532*   Basic Metabolic Panel: Recent Labs  Lab 04/02/22 1345 04/03/22 0129 04/03/22 0421 04/04/22 0616  NA 137 139 139 139  K 5.2* 4.4 4.2 4.5  CL 95* 97* 98 100  CO2 31 29 30  32  GLUCOSE 115* 210* 199* 140*  BUN 29* 31* 30* 34*  CREATININE 1.55* 1.63* 1.65* 1.09*  CALCIUM 9.4 8.6* 8.7* 9.2  MG  --   --   --  2.3  PHOS  --   --   --  4.0   GFR: Estimated Creatinine Clearance: 39.3 mL/min (A) (by C-G formula based on SCr of 1.09 mg/dL (H)). Liver Function Tests: Recent Labs  Lab 04/03/22 0435  PROT 6.8   No results for input(s): "LIPASE", "AMYLASE" in the last 168 hours. No results  for input(s): "AMMONIA" in the last 168 hours. Coagulation Profile: No results for input(s): "INR", "PROTIME" in the last 168 hours. Cardiac Enzymes: No results for input(s): "CKTOTAL", "CKMB", "CKMBINDEX", "TROPONINI" in the last 168 hours. BNP (last 3 results) No results for input(s): "PROBNP" in the last 8760 hours. HbA1C: No results for input(s): "HGBA1C" in the last 72 hours. CBG: No results for input(s): "GLUCAP" in the last 168 hours. Lipid Profile: No results for input(s): "CHOL", "HDL", "LDLCALC", "TRIG", "CHOLHDL", "LDLDIRECT" in the last 72 hours. Thyroid Function Tests: No results for input(s): "TSH", "T4TOTAL", "FREET4", "T3FREE", "THYROIDAB" in the last 72 hours. Anemia Panel: No results for input(s): "VITAMINB12", "FOLATE", "FERRITIN", "TIBC", "IRON", "RETICCTPCT" in the last 72 hours. Urine analysis:    Component Value Date/Time   COLORURINE YELLOW 11/20/2021 [place] 11/20/2021 1420   LABSPEC 1.006 11/20/2021  [place] 6.0 11/20/2021 [place] 11/20/2021 [place] 11/20/2021 [place] 07/15/2021 [place] 11/20/2021 1420   PROTEINUR NEGATIVE 11/20/2021 1420   UROBILINOGEN 1.0 05/29/2009 2253   NITRITE NEGATIVE 11/20/2021 1420   LEUKOCYTESUR TRACE (A) 11/20/2021 1420   Sepsis Labs: @LABRCNTIP (procalcitonin:4,lacticidven:4)  ) Recent Results (from the past 240 hour(s))  Culture, blood (single)     Status: None (Preliminary result)   Collection Time: 04/02/22  1:40 PM   Specimen: BLOOD  Result Value Ref Range Status   Specimen Description BLOOD RIGHT ANTECUBITAL  Final   Special Requests   Final    BOTTLES DRAWN AEROBIC ONLY Blood Culture adequate volume   Culture   Final    NO GROWTH 2 DAYS Performed at [facility], [address], [place], [place] [address]    Report Status PENDING  Incomplete  Blood culture (single)     Status: None (Preliminary result)    Collection Time: 04/02/22  3:15 PM   Specimen: BLOOD  Result Value Ref Range Status   Specimen Description BLOOD BLOOD LEFT ARM  Final   Special Requests   Final    BOTTLES DRAWN AEROBIC AND ANAEROBIC Blood Culture results may not be optimal due to an excessive volume of blood received in culture bottles   Culture   Final    NO GROWTH 2 DAYS Performed at [facility], [address] [place], [place] [address]    Report Status PENDING  Incomplete  SARS Coronavirus 2 by RT PCR (hospital order, performed in [place] hospital lab) *cepheid single result test* Anterior Nasal Swab     Status: None   Collection Time: 04/03/22  8:45 AM   Specimen: Anterior Nasal Swab  Result Value Ref Range Status   SARS Coronavirus 2 by RT PCR NEGATIVE NEGATIVE Final    Comment: (NOTE) SARS-CoV-2 target nucleic acids are NOT DETECTED.  The SARS-CoV-2 RNA is generally detectable in upper and lower respiratory specimens during the acute phase of infection. The lowest concentration of SARS-CoV-2 viral copies this assay can detect is 250 copies / mL. A negative result does not preclude SARS-CoV-2 infection and should not be used as the sole basis for treatment or other patient management decisions.  A negative result may occur with improper specimen collection / handling, submission of specimen other than nasopharyngeal swab, presence of viral mutation(s) within the areas targeted by this assay, and inadequate number of viral copies (<250 copies / mL). A negative result must be combined with clinical observations, patient history, and epidemiological information.  Fact Sheet for Patients:   https://www.patel.info/  Fact Sheet for Healthcare Providers: https://hall.com/  This test is not yet approved or  cleared by the [place] FDA and has been authorized for detection and/or diagnosis of SARS-CoV-2 by FDA under an Emergency Use Authorization  (EUA).  This EUA will remain in effect (meaning this test can be used) for the duration of the COVID-19 declaration under Section 564(b)(1) of the Act, 21 U.S.C. section 360bbb-3(b)(1), unless the authorization is terminated or revoked sooner.  Performed at [facility], [address] [place], [place] [address]   Body fluid culture w Gram Stain     Status: None (Preliminary result)   Collection Time: 04/03/22  3:40 PM   Specimen: PATH Cytology Pleural fluid  Result Value Ref Range Status   Specimen Description   Final    PLEURAL Performed  at [facility], [address] [place], [place] [address]    Special Requests   Final    NONE Performed at [facility], [place], [place] [address]    Gram Stain   Final    CYTOSPIN SMEAR WBC PRESENT, PREDOMINANTLY MONONUCLEAR NO ORGANISMS SEEN    Culture   Final    NO GROWTH < 24 HOURS Performed at [facility], [place] [address] [place],  [address]    Report Status PENDING  Incomplete         Radiology Studies: DG Chest Port 1 View  Result Date: 04/03/2022 CLINICAL DATA:  Pleural effusion, status post thoracentesis EXAM: PORTABLE CHEST 1 VIEW COMPARISON:  05/03/2022 FINDINGS: Interval decrease in the size of a previously noted right pleural effusion, now trace. Unchanged cardiac and mediastinal contours. Redemonstrated diffuse interstitial prominence, unchanged, likely reflecting chronic lung disease. No pneumothorax. No acute osseous abnormality. Asymmetric advanced degenerative changes in the left shoulder. IMPRESSION: Interval decrease in the size of a previously noted right pleural effusion, now trace. No pneumothorax. Electronically Signed   By: Merilyn Baba M.D.   On: 04/03/2022 16:15   US THORACENTESIS ASP PLEURAL SPACE W/IMG GUIDE  Result Date: 04/03/2022 INDICATION: Patient with history of chronic AFib, chronic combined systolic and diastolic heart failure,  pulmonary hypertension who presents with shortness of breath, pleural effusions. Request made for diagnostic and therapeutic thoracentesis. EXAM: ULTRASOUND GUIDED DIAGNOSTIC AND THERAPEUTIC RIGHT THORACENTESIS MEDICATIONS: 10 ML 1% LIDOCAINE COMPLICATIONS: None immediate. PROCEDURE: An ultrasound guided thoracentesis was thoroughly discussed with the patient and questions answered. The benefits, risks, alternatives and complications were also discussed. The patient understands and wishes to proceed with the procedure. Written consent was obtained. Ultrasound was performed to localize and mark an adequate pocket of fluid in the right chest. The area was then prepped and draped in the normal sterile fashion. 1% Lidocaine was used for local anesthesia. Under ultrasound guidance a 6 Fr Safe-T-Centesis catheter was introduced. Thoracentesis was performed. The catheter was removed and a dressing applied. FINDINGS: A total of approximately 500 mL of amber, clear fluid was removed. Samples were sent to the laboratory as requested by the clinical team. IMPRESSION: Successful ultrasound guided right thoracentesis yielding 500 mL of pleural fluid. Read by: Brynda Greathouse PA-C Electronically Signed   By: Markus Daft M.D.   On: 04/03/2022 15:42   US RENAL  Result Date: 04/02/2022 CLINICAL DATA:  Acute kidney injury EXAM: RENAL / URINARY TRACT ULTRASOUND COMPLETE COMPARISON:  CT 01/29/2022 FINDINGS: Right Kidney: Renal measurements: 10.3 by 4.6 x 4.4 cm = volume: 108 mL. Appears slightly echogenic. No mass or hydronephrosis. Left Kidney: Renal measurements: 10.8 x 5 x 4.1 cm = volume: 114 mL. Appears slightly echogenic. No mass or hydronephrosis Bladder: Appears normal for degree of bladder distention. Other: None. IMPRESSION: 1. Kidneys appear slightly echogenic suggesting medical renal disease. 2. Negative for hydronephrosis Electronically Signed   By: Donavan Foil M.D.   On: 04/02/2022 17:42   US Venous Img Lower  Bilateral  Result Date: 04/02/2022 CLINICAL DATA:  Bilateral lower extremity edema. EXAM: BILATERAL LOWER EXTREMITY VENOUS DOPPLER ULTRASOUND TECHNIQUE: Gray-scale sonography with graded compression, as well as color Doppler and duplex ultrasound were performed to evaluate the lower extremity deep venous systems from the level of the common femoral vein and including the common femoral, femoral, profunda femoral, popliteal and calf veins including the posterior tibial, peroneal and gastrocnemius veins when visible. The superficial great  saphenous vein was also interrogated. Spectral Doppler was utilized to evaluate flow at rest and with distal augmentation maneuvers in the common femoral, femoral and popliteal veins. COMPARISON:  None Available. FINDINGS: RIGHT LOWER EXTREMITY Common Femoral Vein: No evidence of thrombus. Normal compressibility, respiratory phasicity and response to augmentation. Saphenofemoral Junction: No evidence of thrombus. Normal compressibility and flow on color Doppler imaging. Profunda Femoral Vein: No evidence of thrombus. Normal compressibility and flow on color Doppler imaging. Femoral Vein: No evidence of thrombus. Normal compressibility, respiratory phasicity and response to augmentation. Popliteal Vein: No evidence of thrombus. Normal compressibility, respiratory phasicity and response to augmentation. Calf Veins: No evidence of thrombus. Limited ability to compress calf veins. Superficial Great Saphenous Vein: No evidence of thrombus. Normal compressibility. Venous Reflux:  None. Other Findings: No evidence of superficial thrombophlebitis or abnormal fluid collection. LEFT LOWER EXTREMITY Common Femoral Vein: No evidence of thrombus. Normal compressibility, respiratory phasicity and response to augmentation. Saphenofemoral Junction: No evidence of thrombus. Normal compressibility and flow on color Doppler imaging. Profunda Femoral Vein: No evidence of thrombus. Normal  compressibility and flow on color Doppler imaging. Femoral Vein: No evidence of thrombus. Normal compressibility, respiratory phasicity and response to augmentation. Popliteal Vein: No evidence of thrombus. Normal compressibility, respiratory phasicity and response to augmentation. Calf Veins: No evidence of thrombus. Limited ability to compress calf veins. Superficial Great Saphenous Vein: No evidence of thrombus. Normal compressibility. Venous Reflux:  None. Other Findings: No evidence of superficial thrombophlebitis or abnormal fluid collection. IMPRESSION: No evidence of deep venous thrombosis in either lower extremity. Limited evaluation of bilateral calf veins due to limited ability to perform compression. Electronically Signed   By: Aletta Edouard M.D.   On: 04/02/2022 16:36   CT Angio Chest PE W and/or Wo Contrast  Result Date: 04/02/2022 CLINICAL DATA:  Shortness of breath for 4 days. EXAM: CT ANGIOGRAPHY CHEST WITH CONTRAST TECHNIQUE: Multidetector CT imaging of the chest was performed using the standard protocol during bolus administration of intravenous contrast. Multiplanar CT image reconstructions and MIPs were obtained to evaluate the vascular anatomy. RADIATION DOSE REDUCTION: This exam was performed according to the departmental dose-optimization program which includes automated exposure control, adjustment of the mA and/or kV according to patient size and/or use of iterative reconstruction technique. CONTRAST:  41mL OMNIPAQUE IOHEXOL 350 MG/ML SOLN COMPARISON:  01/29/2022 FINDINGS: Cardiovascular: The heart is mildly enlarged but stable. Stable tortuosity, ectasia and calcification of the thoracic aorta. Stable large saccular aneurysm projecting off the descending thoracic aorta and measuring approximately 3.1 x 2.9 cm. This is stable. Stable three-vessel coronary artery calcifications. Reflux of contrast down the IVC and into the hepatic veins is likely due to tricuspid regurgitation. The  pulmonary arterial tree is well opacified. No filling defects to suggest pulmonary embolism. Mediastinum/Nodes: Stable scattered mediastinal and hilar lymph nodes but no mass or overt adenopathy. The esophagus is grossly normal. Lungs/Pleura: Moderate right-sided pleural effusion with overlying atelectasis. Very small left pleural effusion with left basilar atelectasis. Chronic emphysematous and pulmonary scarring changes. No pulmonary infiltrates or pulmonary edema. Upper Abdomen: No significant upper abdominal findings. Musculoskeletal: No breast masses axillary adenopathy. Stable right thyroid goiter. The bony thorax is intact. Stable osteoporosis and degenerative changes. Review of the MIP images confirms the above findings. IMPRESSION: 1. No CT findings for pulmonary embolism. 2. Stable tortuosity, ectasia and calcification of the thoracic aorta. Stable 3.4 cm saccular aneurysm projecting off the descending thoracic aorta. 3. Stable three-vessel coronary artery calcifications. 4. Moderate right-sided pleural effusion  with overlying atelectasis. 5. Very small left pleural effusion with left basilar atelectasis. 6. Chronic emphysematous and pulmonary scarring changes. No significant acute overlying pulmonary process. 7. Aortic atherosclerosis. Aortic Atherosclerosis (ICD10-I70.0) and Emphysema (ICD10-J43.9). Electronically Signed   By: Marijo Sanes M.D.   On: 04/02/2022 15:40   DG Chest Port 1 View  Result Date: 04/02/2022 CLINICAL DATA:  Hypoxia, shortness of breath EXAM: PORTABLE CHEST 1 VIEW COMPARISON:  12/05/2021 FINDINGS: Small right pleural effusion. Cardiomegaly. Diffuse interstitial prominence, stable since prior study, likely reflecting chronic lung disease. Aortic atherosclerosis. No acute bony abnormality. IMPRESSION: Stable chronic cardiomegaly and chronic interstitial lung disease. Small right pleural effusion. Electronically Signed   By: Rolm Baptise M.D.   On: 04/02/2022 13:41         Scheduled Meds:  apixaban  2.5 mg Oral BID   cholecalciferol  1,000 Units Oral Daily   citalopram  20 mg Oral Daily   digoxin  0.0625 mg Oral Daily   feeding supplement  1 Container Oral BID BM   gabapentin  100 mg Oral TID   ipratropium-albuterol  3 mL Nebulization Q6H   levothyroxine  88 mcg Oral Q0600   melatonin  5 mg Oral QHS   metoprolol tartrate  12.5 mg Oral BID   multivitamin with minerals  1 tablet Oral Daily   pantoprazole  40 mg Oral Daily   predniSONE  40 mg Oral Q breakfast   vitamin B-12  100 mcg Oral Daily   Continuous Infusions:   LOS: 2 days     Desma Maxim, MD [facility]   If 7PM-7AM, please contact night-coverage [email or web address] Password [facility] 04/04/2022, 12:47 PM

## 2022-04-05 DIAGNOSIS — J9621 Acute and chronic respiratory failure with hypoxia: Secondary | ICD-10-CM | POA: Diagnosis not present

## 2022-04-05 LAB — BASIC METABOLIC PANEL
Anion gap: 9 (ref 5–15)
BUN: 30 mg/dL — ABNORMAL HIGH (ref 8–23)
CO2: 29 mmol/L (ref 22–32)
Calcium: 8.9 mg/dL (ref 8.9–10.3)
Chloride: 100 mmol/L (ref 98–111)
Creatinine, Ser: 0.94 mg/dL (ref 0.44–1.00)
GFR, Estimated: >60 mL/min (ref >60)
Glucose, Bld: 103 mg/dL — ABNORMAL HIGH (ref 70–99)
Potassium: 4.4 mmol/L (ref 3.5–5.1)
Sodium: 138 mmol/L (ref 135–145)

## 2022-04-05 LAB — CBC
HCT: 47.1 % — ABNORMAL HIGH (ref 36.0–46.0)
Hemoglobin: 14.9 g/dL (ref 12.0–15.0)
MCH: 34.7 pg — ABNORMAL HIGH (ref 26.0–34.0)
MCHC: 31.6 g/dL (ref 30.0–36.0)
MCV: 109.5 fL — ABNORMAL HIGH (ref 80.0–100.0)
Platelets: 107 10*3/uL — ABNORMAL LOW (ref 150–400)
RBC: 4.3 MIL/uL (ref 3.87–5.11)
RDW: 14.1 % (ref 11.5–15.5)
WBC: 8.8 10*3/uL (ref 4.0–10.5)
nRBC: 0.2 % (ref 0.0–0.2)

## 2022-04-05 LAB — DIGOXIN LEVEL: Digoxin Level: 0.6 ng/mL — ABNORMAL LOW (ref 0.8–2.0)

## 2022-04-05 LAB — VITAMIN B12: Vitamin B-12: 918 pg/mL — ABNORMAL HIGH (ref 180–914)

## 2022-04-05 MED ORDER — TORSEMIDE 20 MG PO TABS
40.0000 mg | ORAL_TABLET | Freq: Every day | ORAL | Status: DC
  Administered 2022-04-05 – 2022-04-07 (×3): 40 mg via ORAL
  Filled 2022-04-05 (×3): qty 2

## 2022-04-05 MED ORDER — TORSEMIDE 20 MG PO TABS: 40.0000 mg | ORAL_TABLET | Freq: Two times a day (BID) | ORAL | Status: DC

## 2022-04-05 NOTE — Progress Notes (Signed)
[facility] rm [address] [employer] [facility]) Hospitalized Hospice Patient   [name] is a current ACC patient with a terminal diagnosis of hypertensive heart disease with heart failure. Patient began experiencing worsening respiratory distress at her facility and was evaluated by [facility] nurse. Decision was made in part due to her code status as well as per request of patient to have her evaluated in ED. She is admitted with a diagnosis of acute on chronic respiratory failure. Per Dr. Cherie Ouch with [facility] this is a related hospice admission.   [name] continues to have high oxygen demands and continues to have off and on periods of shortness of breath. She remains inpatient appropriate as they attempt to wean her down off of the high-level oxygen, currently at 12L HFNC  Patient is inpatient appropriate due to need for IV fluids, and attempting to wean down high O2 demand   V/S: 97.5/81/20    114/70   95% 12L HFNC  I&O: not documented  Labs: BUN 30, HCT 47.6, Serum Digoxin 0.6  Diagnostics:    IV/PRN: LR @ 100cc/H, stopped at 4:30am, NS @ 50cc/H, stopped at 9am.   Problem List: # Acute on chronic hypoxic respiratory failure # COPD # Pleural effusion Baseline at least 4 L need, here tachypnic on 8. Multifactorial, 2/2 copd, osa, dCHF, phtn. No PE on CT imaging. Does have moderate pleural effusion. No fever or cough or abrupt changes to suggest copd exacerbation though course of steroids may help. No chest pain. Does not appear to be chf exacerbation. 500 cc thoracentesis 10/12, transudate. Covid negative. Followed by palliative as outpt. Pulm had advised 6 L at last visit. Pulm consulted, advises palliative, says nothing more to be done - wean steroids, duonebs - continue oxygen - palliative consult - f/u pleural fluid cytology and culture   # AKI  Cr 1.6 from baseline of around 1. Does not appear to be significantly fluid overloaded. Improved with holding home torsemide and with gentle  hydration - continue to hold torsemide - stop fluids - monitor   # A-fib Here HR somewhat elevated - cont home apixaban, digoxin - f/u dig level - increase metop to 25 bid   D/C planning: Ongoing, likely back to ALF once stable  Family: Talked with daughters by phone, their goal is for patient to stabilize and return to ALF with hospice IDT: Updated GOC: Ongoing, patient desires treatment for current exacerbation and limited code, wants CPR but would not want to be intubated.   Thank you, Jhonnie Garner, BSN, RN, [facility] hospital liaison [phone] or Epic chat

## 2022-04-05 NOTE — TOC Progression Note (Signed)
Transition of Care [facility]) - Progression Note    Patient Details  Name: [name] MRN: [id number] Date of Birth: 1940/07/05  Transition of Care [facility]) CM/SW Contact  Izola Price, RN Phone Number: 04/05/2022, 2:59 PM  Clinical Narrative: 10/14: Unable to locate a [employer] near [place] in [place]. [facility] is closest. [place] daughter, Anne Ng, to clarify this choice. She is going to check but gave verbal permission to put [facility] along with [facility], both in [place], [place]. [place] and CSW report sent via electronic hub to those two STR/SNF facilities as disposition plan is yet TBD. [facility] coming to do assessment Monday. Remains on high flow [place] at 12L/[place]. Simmie Davies RN CM            Expected Discharge Plan and Services                                                 Social Determinants of Health (SDOH) Interventions    Readmission Risk Interventions     No data to display

## 2022-04-05 NOTE — TOC Progression Note (Addendum)
Transition of Care [facility]) - Progression Note    Patient Details  Name: Eliyanah Elgersma MRN: [id number] Date of Birth: 02/05/1941  Transition of Care [facility]) CM/SW Contact  Izola Price, RN Phone Number: 04/05/2022, 11:58 AM  Clinical Narrative: 10/14: Spoke with patient's daughter Anne Ng and she indicated that [name] was contacted directly on specific barriers to returning to this ALF. Patient is still on 12L/[place] HF oxygen. Patient's partial code status was also a barrier due to having to send to ED with any changes in status. Anne Ng says the Director will come to perform an assessment of patient on Monday for final determination. She also indicated she was under impression that partial code had been changed to DNR, which RN CM will update provider on this issue. Anne Ng also indicated if this plan fails that patient had tentatively agreed to two SNFs: [facility] in [place], [place] and [facility] in [place], [place]. Updated provider and hospice liaison and [place] notified unit RN. Simmie Davies RN CM   Provider indicates code status remains partial code. Simmie Davies RN CM   PASRR# [id number] A 06/21/2021        Expected Discharge Plan and Services                                                 Social Determinants of Health (SDOH) Interventions    Readmission Risk Interventions     No data to display

## 2022-04-05 NOTE — Progress Notes (Signed)
PROGRESS NOTE    [name]  ELF:[id number] DOB: [birth year] DOA: 04/02/2022 PCP: Jinny Sanders, MD  Outpatient Specialists: pulmonology, cardiology    Brief Narrative:   From admission h and p [name] is a 81 y.o. female with medical history significant of severe COPD c/b chronic hypoxic and hypercapnic respiratory failure on 4 L [place] O2, severe pulmonary hypertension, systolic CHF, atrial fibrillation on Eliquis, peripheral artery disease, NSCLC s/p XRT/chemotherapy in remission, OSA not on CPAP, who presents to the ED with shortness of breath.   Ms. Guilfoil states she has been experiencing a gradual decline over the last 6 months and has noticed she has become increasingly weak due to sedentary lifestyle at her ALF.  During this time, she has experienced increased dyspnea on exertion.  She has noticed a marked worsening in her work of breathing over the last week ago.  She endorses increased shortness of breath only, denying any cough or sputum production.  She denies any fever, chills, congestion, recent nausea or vomiting, diarrhea, abdominal pain, urinary or bowel changes, or focal weakness.   Per chart review, patient followed up with Dr. [name] at [facility] pulmonology on 9/19.  At that time, she was saturating at 78% on 4 L nasal cannula O2.  She was increased to 6 L at that time with improvement in saturation to 89% she was in atrial fibrillation with RVR with blood pressure of 95/58.  At that time, she was suspected to be experiencing acute on chronic CHF.  However patient declined evaluation in the ED.  It was recommended at that time that her baseline O2 be increased to 6 L; unclear if this change was made at patient's ALF.  Assessment & Plan:   Principal Problem:   Acute on chronic respiratory failure with hypoxia (HCC) Active Problems:   COPD (chronic obstructive pulmonary disease) (HCC)   Pleural effusion on right   Hypothyroid   AKI (acute kidney injury)  ([place])   Obstructive sleep apnea   Chronic systolic CHF (congestive heart failure) (HCC)   Pulmonary hypertension (HCC)   Hyperkalemia   Demand ischemia   Permanent atrial fibrillation (HCC)   Physical deconditioning   Thrombocytopenia (HCC)   HTN (hypertension)  # Acute on chronic hypoxic respiratory failure # COPD # Pleural effusion Baseline at least 4 L need, here requiring high flow. Multifactorial, 2/2 copd, osa, dCHF, phtn. No PE on CT imaging. Does have moderate pleural effusion. No fever or cough or abrupt changes to suggest copd exacerbation though course of steroids may help. No chest pain. Does not appear to be chf exacerbation. 500 cc thoracentesis 10/12, transudate. Covid negative. Followed by palliative as outpt. Pulm had advised 6 L at last visit. Pulm consulted, advises palliative, says nothing more to be done - wean steroids, duonebs - continue oxygen - palliative consulted, continues to want to treat the treatable, no changes for now to code status (partial, no intubation) - f/u pleural fluid cytology and culture  # Debility Resides in ALF but they are refusing to take her - pursuing options  # AKI  Cr 1.6 from baseline of around 1. Does not appear to be significantly fluid overloaded. resolved with holding home torsemide and with gentle hydration - resume torsemide - monitor  # HFpEF Appears relatively compensated - resume home torsemide but will decrease frequency from twice to once daily  # A-fib Here HR somewhat elevated - cont home apixaban, digoxin - f/u dig level - increased metop  to 25 bid - resuming torsemide as above  # Hypothyroid - cont home synthroid   DVT prophylaxis: apixaban Code Status: partial (no intubation) Family Communication: daughters updated telephonically 10/13  Level of care: Progressive Status is: Inpatient Remains inpatient appropriate because: severity of illness    Consultants:  pulmonology  Procedures: [place]  pending  Antimicrobials:  S/p one dose ceftriaxone    Subjective: This morning breathing stable from yesterday, no chest pain, fever, or cough  Objective: Vitals:   04/05/22 0455 04/05/22 0728 04/05/22 1008 04/05/22 1014  BP: 117/79  (!) 108/93   Pulse: (!) 29   88  Resp: (!) 25  (!) 29   Temp: 97.6 F (36.4 C)  (!) 97.3 F (36.3 C)   TempSrc: Oral  Oral   SpO2: 92% 92% 98%   Weight:      Height:        Intake/Output Summary (Last 24 hours) at 04/05/2022 1101 Last data filed at 04/04/2022 1957 Gross per 24 hour  Intake --  Output 700 ml  Net -700 ml   Filed Weights   04/02/22 1305  Weight: 90.7 kg    Examination:  General exam: Appears chronically ill Respiratory system:  rales at bases Cardiovascular system: S1 & S2 heard, RRR. No JVD, murmurs, rubs, gallops or clicks.   Gastrointestinal system: Abdomen is obese, soft and nontender. No organomegaly or masses felt. Normal bowel sounds heard. Central nervous system: Alert and oriented. No focal neurological deficits. Extremities: Symmetric 5 x 5 power. 1+ LE edema Skin: No rashes  Psychiatry: Judgement and insight appear normal. Mood & affect appropriate.     Data Reviewed: I have personally reviewed following labs and imaging studies  CBC: Recent Labs  Lab 04/02/22 1345 04/03/22 0129 04/03/22 0421 04/04/22 0616 04/05/22 0508  WBC 7.0 5.0 5.0 9.2 8.8  NEUTROABS  --  4.5 4.2  --   --   HGB 16.0* 15.1* 15.4* 14.3 14.9  HCT 51.1* 48.4* 50.4* 45.0 47.1*  MCV 109.2* 110.0* 111.5* 107.1* 109.5*  PLT 107* 94* 100* 101* 545*   Basic Metabolic Panel: Recent Labs  Lab 04/02/22 1345 04/03/22 0129 04/03/22 0421 04/04/22 0616 04/05/22 0508  NA 137 139 139 139 138  K 5.2* 4.4 4.2 4.5 4.4  CL 95* 97* 98 100 100  CO2 31 29 30  32 29  GLUCOSE 115* 210* 199* 140* 103*  BUN 29* 31* 30* 34* 30*  CREATININE 1.55* 1.63* 1.65* 1.09* 0.94  CALCIUM 9.4 8.6* 8.7* 9.2 8.9  MG  --   --   --  2.3  --   PHOS  --    --   --  4.0  --    GFR: Estimated Creatinine Clearance: 45.5 mL/min (by C-G formula based on SCr of 0.94 mg/dL). Liver Function Tests: Recent Labs  Lab 04/03/22 0435  PROT 6.8   No results for input(s): "LIPASE", "AMYLASE" in the last 168 hours. No results for input(s): "AMMONIA" in the last 168 hours. Coagulation Profile: No results for input(s): "INR", "PROTIME" in the last 168 hours. Cardiac Enzymes: No results for input(s): "CKTOTAL", "CKMB", "CKMBINDEX", "TROPONINI" in the last 168 hours. BNP (last 3 results) No results for input(s): "PROBNP" in the last 8760 hours. HbA1C: No results for input(s): "HGBA1C" in the last 72 hours. CBG: No results for input(s): "GLUCAP" in the last 168 hours. Lipid Profile: No results for input(s): "CHOL", "HDL", "LDLCALC", "TRIG", "CHOLHDL", "LDLDIRECT" in the last 72 hours. Thyroid Function Tests: No  results for input(s): "TSH", "T4TOTAL", "FREET4", "T3FREE", "THYROIDAB" in the last 72 hours. Anemia Panel: No results for input(s): "VITAMINB12", "FOLATE", "FERRITIN", "TIBC", "IRON", "RETICCTPCT" in the last 72 hours. Urine analysis:    Component Value Date/Time   COLORURINE YELLOW 11/20/2021 [place] 11/20/2021 1420   LABSPEC 1.006 11/20/2021 1420   PHURINE 6.0 11/20/2021 1420   GLUCOSEU NEGATIVE 11/20/2021 1420   HGBUR NEGATIVE 11/20/2021 1420   BILIRUBINUR NEGATIVE 07/15/2021 0644   KETONESUR NEGATIVE 11/20/2021 1420   PROTEINUR NEGATIVE 11/20/2021 1420   UROBILINOGEN 1.0 05/29/2009 2253   NITRITE NEGATIVE 11/20/2021 1420   LEUKOCYTESUR TRACE (A) 11/20/2021 1420   Sepsis Labs: @LABRCNTIP (procalcitonin:4,lacticidven:4)  ) Recent Results (from the past 240 hour(s))  Culture, blood (single)     Status: None (Preliminary result)   Collection Time: 04/02/22  1:40 PM   Specimen: BLOOD  Result Value Ref Range Status   Specimen Description BLOOD RIGHT ANTECUBITAL  Final   Special Requests   Final    BOTTLES DRAWN  AEROBIC ONLY Blood Culture adequate volume   Culture   Final    NO GROWTH 3 DAYS Performed at [facility], [address] [place], [place] [address]    Report Status PENDING  Incomplete  Blood culture (single)     Status: None (Preliminary result)   Collection Time: 04/02/22  3:15 PM   Specimen: BLOOD  Result Value Ref Range Status   Specimen Description BLOOD BLOOD LEFT ARM  Final   Special Requests   Final    BOTTLES DRAWN AEROBIC AND ANAEROBIC Blood Culture results may not be optimal due to an excessive volume of blood received in culture bottles   Culture   Final    NO GROWTH 3 DAYS Performed at [facility], [address] [place],  [address]    Report Status PENDING  Incomplete  SARS Coronavirus 2 by RT PCR (hospital order, performed in [place] hospital lab) *cepheid single result test* Anterior Nasal Swab     Status: None   Collection Time: 04/03/22  8:45 AM   Specimen: Anterior Nasal Swab  Result Value Ref Range Status   SARS Coronavirus 2 by RT PCR NEGATIVE NEGATIVE Final    Comment: (NOTE) SARS-CoV-2 target nucleic acids are NOT DETECTED.  The SARS-CoV-2 RNA is generally detectable in upper and lower respiratory specimens during the acute phase of infection. The lowest concentration of SARS-CoV-2 viral copies this assay can detect is 250 copies / mL. A negative result does not preclude SARS-CoV-2 infection and should not be used as the sole basis for treatment or other patient management decisions.  A negative result may occur with improper specimen collection / handling, submission of specimen other than nasopharyngeal swab, presence of viral mutation(s) within the areas targeted by this assay, and inadequate number of viral copies (<250 copies / mL). A negative result must be combined with clinical observations, patient history, and epidemiological information.  Fact Sheet for Patients:    https://www.patel.info/  Fact Sheet for Healthcare Providers: https://hall.com/  This test is not yet approved or  cleared by the [place] FDA and has been authorized for detection and/or diagnosis of SARS-CoV-2 by FDA under an Emergency Use Authorization (EUA).  This EUA will remain in effect (meaning this test can be used) for the duration of the COVID-19 declaration under Section 564(b)(1) of the Act, 21 U.S.C. section 360bbb-3(b)(1), unless the authorization is terminated or revoked sooner.  Performed at [facility], [phone]  [address] [place], [place] [address]   Body fluid culture w Gram Stain     Status: None (Preliminary result)   Collection Time: 04/03/22  3:40 PM   Specimen: PATH Cytology Pleural fluid  Result Value Ref Range Status   Specimen Description   Final    PLEURAL Performed at [facility], [address], [place], [place] [address]    Special Requests   Final    NONE Performed at [facility], [address] [place], [place] [address]    Gram Stain   Final    CYTOSPIN SMEAR WBC PRESENT, PREDOMINANTLY MONONUCLEAR NO ORGANISMS SEEN    Culture   Final    NO GROWTH 2 DAYS Performed at [facility], [place] [address], [place], [place] [address]    Report Status PENDING  Incomplete         Radiology Studies: DG Chest Port 1 View  Result Date: 04/03/2022 CLINICAL DATA:  Pleural effusion, status post thoracentesis EXAM: PORTABLE CHEST 1 VIEW COMPARISON:  05/03/2022 FINDINGS: Interval decrease in the size of a previously noted right pleural effusion, now trace. Unchanged cardiac and mediastinal contours. Redemonstrated diffuse interstitial prominence, unchanged, likely reflecting chronic lung disease. No pneumothorax. No acute osseous abnormality. Asymmetric advanced degenerative changes in the left shoulder. IMPRESSION: Interval decrease in the size of a previously noted right  pleural effusion, now trace. No pneumothorax. Electronically Signed   By: Merilyn Baba M.D.   On: 04/03/2022 16:15   US THORACENTESIS ASP PLEURAL SPACE W/IMG GUIDE  Result Date: 04/03/2022 INDICATION: Patient with history of chronic AFib, chronic combined systolic and diastolic heart failure, pulmonary hypertension who presents with shortness of breath, pleural effusions. Request made for diagnostic and therapeutic thoracentesis. EXAM: ULTRASOUND GUIDED DIAGNOSTIC AND THERAPEUTIC RIGHT THORACENTESIS MEDICATIONS: 10 ML 1% LIDOCAINE COMPLICATIONS: None immediate. PROCEDURE: An ultrasound guided thoracentesis was thoroughly discussed with the patient and questions answered. The benefits, risks, alternatives and complications were also discussed. The patient understands and wishes to proceed with the procedure. Written consent was obtained. Ultrasound was performed to localize and mark an adequate pocket of fluid in the right chest. The area was then prepped and draped in the normal sterile fashion. 1% Lidocaine was used for local anesthesia. Under ultrasound guidance a 6 Fr Safe-T-Centesis catheter was introduced. Thoracentesis was performed. The catheter was removed and a dressing applied. FINDINGS: A total of approximately 500 mL of amber, clear fluid was removed. Samples were sent to the laboratory as requested by the clinical team. IMPRESSION: Successful ultrasound guided right thoracentesis yielding 500 mL of pleural fluid. Read by: Brynda Greathouse PA-C Electronically Signed   By: Markus Daft M.D.   On: 04/03/2022 15:42        Scheduled Meds:  apixaban  2.5 mg Oral BID   cholecalciferol  1,000 Units Oral Daily   citalopram  20 mg Oral Daily   digoxin  0.0625 mg Oral Daily   feeding supplement  1 Container Oral BID BM   gabapentin  100 mg Oral TID   ipratropium-albuterol  3 mL Nebulization BID   levothyroxine  88 mcg Oral Q0600   melatonin  5 mg Oral QHS   metoprolol tartrate  25 mg Oral BID    multivitamin with minerals  1 tablet Oral Daily   pantoprazole  40 mg Oral Daily   predniSONE  20 mg Oral Q breakfast   vitamin B-12  100 mcg Oral Daily   Continuous Infusions:   LOS:  3 days     Desma Maxim, MD [facility]   If 7PM-7AM, please contact night-coverage [email or web address] Password [email or web address] 04/05/2022, 11:01 AM

## 2022-04-05 NOTE — Plan of Care (Signed)
Patient remains in PCU. Requiring HFNC.    Problem: Education: Goal: Knowledge of General Education information will improve Description: Including pain rating scale, medication(s)/side effects and non-pharmacologic comfort measures Outcome: Not Progressing   Problem: Health Behavior/Discharge Planning: Goal: Ability to manage health-related needs will improve Outcome: Not Progressing   Problem: Clinical Measurements: Goal: Ability to maintain clinical measurements within normal limits will improve Outcome: Not Progressing Goal: Will remain free from infection Outcome: Not Progressing Goal: Diagnostic test results will improve Outcome: Not Progressing Goal: Respiratory complications will improve Outcome: Not Progressing Goal: Cardiovascular complication will be avoided Outcome: Not Progressing   Problem: Activity: Goal: Risk for activity intolerance will decrease Outcome: Not Progressing   Problem: Nutrition: Goal: Adequate nutrition will be maintained Outcome: Not Progressing   Problem: Coping: Goal: Level of anxiety will decrease Outcome: Not Progressing   Problem: Elimination: Goal: Will not experience complications related to bowel motility Outcome: Not Progressing Goal: Will not experience complications related to urinary retention Outcome: Not Progressing   Problem: Pain Managment: Goal: General experience of comfort will improve Outcome: Not Progressing   Problem: Safety: Goal: Ability to remain free from injury will improve Outcome: Not Progressing   Problem: Skin Integrity: Goal: Risk for impaired skin integrity will decrease Outcome: Not Progressing

## 2022-04-05 NOTE — NC FL2 (Signed)
[place] LEVEL OF CARE SCREENING TOOL     IDENTIFICATION  Patient Name: [name] Birthdate: 1941/02/16 Sex: female Admission Date (Current Location): 04/02/2022  [facility] and [place] Number:  Engineering geologist and Address:  [facility], [address] [place], [place] [address]      Provider Number: [id number]  Attending Physician Name and Address:  Gwynne Edinger, MD  Relative Name and Phone Number:  Kanylah, Muench (Daughter)   [phone] (Mobile)    Current Level of Care: Hospital Recommended Level of Care: [place] Prior Approval Number: [id number]  Date Approved/Denied: 06/21/21 PASRR Number: [id number]  Discharge Plan: SNF    Current Diagnoses: Patient Active Problem List   Diagnosis Date Noted   Pulmonary hypertension ([place]) 04/02/2022   AKI (acute kidney injury) ([place]) 04/02/2022   Hyperkalemia 04/02/2022   Pleural effusion on right 04/02/2022   Demand ischemia 04/02/2022   Aneurysm of thoracic aorta ([place]) 01/12/2022   Vaginal bleeding 12/23/2021   Thrombocytopenia ([place]) 12/04/2021   Permanent atrial fibrillation ([place])    Goals of care, counseling/discussion 12/03/2021   Acute on chronic heart failure with preserved ejection fraction (HFpEF) ([place]) 12/02/2021   Chronic low back pain 12/02/2021   Obesity (BMI 30-39.9) 12/02/2021   Anxiety and depression    Osteoarthritis of left shoulder 08/30/2021   Falls 07/15/2021   Acute on chronic respiratory failure with hypoxia ([place]) 05/04/2021   Frequent UTI    Anticoagulant long-term use    Physical deconditioning 72/02/4708   Chronic systolic CHF (congestive heart failure) ([place]) 09/15/2016   Hyperlipidemia    Chronic respiratory failure with hypoxia ([place]) 12/03/2014   Multiple thyroid nodules 10/17/2012   AAA (abdominal aortic aneurysm) ([place]) 05/11/2012   Bradycardia 01/23/2012   Hypothyroid 01/21/2012   HTN (hypertension) 01/21/2012    Obstructive sleep apnea 10/16/2008   History of DVT (deep vein thrombosis) 08/19/2008   COPD (chronic obstructive pulmonary disease) ([place]) 08/19/2008   NEOPLASM, MALIGNANT, LUNG, HX OF 62/83/6629   Diastolic heart failure, NYHA class 2 ([place]) 08/03/2008   Hyperbilirubinemia 2004    Orientation RESPIRATION BLADDER Height & Weight     Self, Time, Situation, Place  O2 External catheter Weight: 90.7 kg Height:  5' (152.4 cm)  BEHAVIORAL SYMPTOMS/MOOD NEUROLOGICAL BOWEL NUTRITION STATUS      Continent (Last BM 04/02/22) Diet  AMBULATORY STATUS COMMUNICATION OF NEEDS Skin   Extensive Assist Verbally Other (Comment) (redness documented on bilateral buttocks and ecchymosis bilateral arms)                       Personal Care Assistance Level of Assistance  Bathing, Dressing, Feeding Bathing Assistance: Maximum assistance Feeding assistance: Limited assistance Dressing Assistance: Maximum assistance     Functional Limitations Info  Sight Sight Info: Impaired (Corrective Glasses)        SPECIAL CARE FACTORS FREQUENCY  PT (By licensed PT), OT (By licensed OT)     PT Frequency: 5x/week OT Frequency: 5x/week            Contractures Contractures Info: Not present    Additional Factors Info  Code Status, Allergies Code Status Info: Partial Code: See chart for specifics. Allergies Info: No Known allergies: see chart for any updates.           Current Medications (04/05/2022):  This is the current hospital active medication list Current Facility-Administered Medications  Medication Dose Route Frequency Provider Last Rate Last Admin   acetaminophen (TYLENOL)  tablet 1,000 mg  1,000 mg Oral Q6H PRN Wouk, Ailene Rud, MD       albuterol (PROVENTIL) (2.5 MG/3ML) 0.083% nebulizer solution 3 mL  3 mL Inhalation Q6H PRN Wouk, Ailene Rud, MD       apixaban Arne [place]) tablet 2.5 mg  2.5 mg Oral BID Jose Persia, MD   2.5 mg at 04/05/22 1014   cholecalciferol (VITAMIN D3) 25  MCG (1000 UNIT) tablet 1,000 Units  1,000 Units Oral Daily Gwynne Edinger, MD   1,000 Units at 04/05/22 1013   citalopram (CELEXA) tablet 20 mg  20 mg Oral Daily Jose Persia, MD   20 mg at 04/05/22 1013   digoxin (LANOXIN) tablet 0.0625 mg  0.0625 mg Oral Daily Jose Persia, MD   0.0625 mg at 04/05/22 1014   feeding supplement (BOOST / RESOURCE BREEZE) liquid 1 Container  1 Container Oral BID BM Wouk, Ailene Rud, MD   1 Container at 04/05/22 1015   gabapentin (NEURONTIN) capsule 100 mg  100 mg Oral TID Jose Persia, MD   100 mg at 04/05/22 1014   ipratropium-albuterol (DUONEB) 0.5-2.5 (3) MG/3ML nebulizer solution 3 mL  3 mL Nebulization BID Gwynne Edinger, MD   3 mL at 04/05/22 0728   levothyroxine (SYNTHROID) tablet 88 mcg  88 mcg Oral Q0600 Jose Persia, MD   88 mcg at 04/05/22 0455   melatonin tablet 5 mg  5 mg Oral QHS Wouk, Ailene Rud, MD   5 mg at 04/04/22 2239   metoprolol tartrate (LOPRESSOR) tablet 25 mg  25 mg Oral BID Gwynne Edinger, MD   25 mg at 04/05/22 1014   morphine (MSIR) tablet 7.5 mg  7.5 mg Oral Q6H PRN Jose Persia, MD       multivitamin with minerals tablet 1 tablet  1 tablet Oral Daily Wouk, Ailene Rud, MD   1 tablet at 04/05/22 1014   pantoprazole (PROTONIX) EC tablet 40 mg  40 mg Oral Daily Jose Persia, MD   40 mg at 04/05/22 1014   polyethylene glycol (MIRALAX / GLYCOLAX) packet 17 g  17 g Oral Daily PRN Jose Persia, MD       predniSONE (DELTASONE) tablet 20 mg  20 mg Oral Q breakfast Wouk, Ailene Rud, MD   20 mg at 04/05/22 1014   torsemide (DEMADEX) tablet 40 mg  40 mg Oral Daily Gwynne Edinger, MD   40 mg at 04/05/22 1218   vitamin B-12 (CYANOCOBALAMIN) tablet 100 mcg  100 mcg Oral Daily Gwynne Edinger, MD   100 mcg at 04/05/22 1014     Discharge Medications: Please see discharge summary for a list of discharge medications.  Relevant Imaging Results:  Relevant Lab Results:   Additional Information Patient's  SS# [id number]  Izola Price, RN

## 2022-04-06 DIAGNOSIS — J9621 Acute and chronic respiratory failure with hypoxia: Secondary | ICD-10-CM | POA: Diagnosis not present

## 2022-04-06 LAB — CBC
HCT: 46.3 % — ABNORMAL HIGH (ref 36.0–46.0)
Hemoglobin: 14.5 g/dL (ref 12.0–15.0)
MCH: 34.6 pg — ABNORMAL HIGH (ref 26.0–34.0)
MCHC: 31.3 g/dL (ref 30.0–36.0)
MCV: 110.5 fL — ABNORMAL HIGH (ref 80.0–100.0)
Platelets: 103 10*3/uL — ABNORMAL LOW (ref 150–400)
RBC: 4.19 MIL/uL (ref 3.87–5.11)
RDW: 14.2 % (ref 11.5–15.5)
WBC: 7.1 10*3/uL (ref 4.0–10.5)
nRBC: 0.3 % — ABNORMAL HIGH (ref 0.0–0.2)

## 2022-04-06 LAB — BASIC METABOLIC PANEL
Anion gap: 8 (ref 5–15)
BUN: 29 mg/dL — ABNORMAL HIGH (ref 8–23)
CO2: 32 mmol/L (ref 22–32)
Calcium: 8.7 mg/dL — ABNORMAL LOW (ref 8.9–10.3)
Chloride: 101 mmol/L (ref 98–111)
Creatinine, Ser: 0.94 mg/dL (ref 0.44–1.00)
GFR, Estimated: >60 mL/min (ref >60)
Glucose, Bld: 99 mg/dL (ref 70–99)
Potassium: 3.8 mmol/L (ref 3.5–5.1)
Sodium: 141 mmol/L (ref 135–145)

## 2022-04-06 MED ORDER — OXYMETAZOLINE HCL 0.05 % NA SOLN
1.0000 | Freq: Two times a day (BID) | NASAL | Status: AC | PRN
  Filled 2022-04-06: qty 15

## 2022-04-06 NOTE — Progress Notes (Signed)
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 Patient began experiencing worsening respiratory distress at her facility and was evaluated by [facility] nurse. Decision was made in part due to her code status as well as per request of patient to have her evaluated in ED. She is admitted with a diagnosis of acute on chronic respiratory failure. Per Dr. Cherie Ouch with [facility] this is a related hospice admission.   [name] continues to have high oxygen demands and continues to have off and on periods of shortness of breath. She remains inpatient appropriate as they attempt to wean her down off of the high-level oxygen, currently at 8L HFNC  Patient is inpatient appropriate while attempting to wean down high O2 demand necessitating higher level monitoring.   V/S: 97.5/ 86/18    103/56    95% 8L HFNC  I&O: 0/450  Labs: BUN 29, Ca+ 8.7, Platelets 103  Diagnostics: None new   IV/PRN: N/A   Problem List: Pleural effusion Baseline at least 4 L need, here requiring high flow. Multifactorial, 2/2 copd, osa, dCHF, phtn. No PE on CT imaging. Does have moderate pleural effusion. No fever or cough or abrupt changes to suggest copd exacerbation though course of steroids may help. No chest pain. Does not appear to be chf exacerbation. 500 cc thoracentesis 10/12, transudate. Covid negative. Followed by palliative as outpt. Pulm had advised 6 L at last visit. Pulm consulted, advises palliative, says nothing more to be done. Down to 8 L HF [place] today - wean steroids, duonebs - continue oxygen - palliative consulted, continues to want to treat the treatable, no changes for now to code status (partial, no intubation) - f/u pleural fluid cytology and culture   # Debility Resides in ALF but they are refusing to take her back - ALF representative will come tomorrow to assess. TOC also  looking into SNF options   # AKI  Cr 1.6 from baseline of around 1. Does not appear to be significantly fluid overloaded. resolved with holding home torsemide and with gentle hydration - resumed torsemide - monitor   # A-fib Rate conrolled - cont home apixaban, digoxin - increased metop to 25 bid - resumed torsemide as above   D/C planning: Ongoing, likely back to ALF once stable  Family: Talked with daughters by phone, their goal is for patient to stabilize and return to ALF with hospice IDT: Updated GOC: Ongoing, patient desires treatment for current exacerbation and limited code, wants CPR but would not want to be intubated.   Thank you, Jhonnie Garner, BSN, RN, [facility] hospital liaison [phone] or Epic chat

## 2022-04-06 NOTE — Progress Notes (Signed)
PROGRESS NOTE    [name]  URK:[id number] DOB: Apr 09, 1941 DOA: 04/02/2022 PCP: Jinny Sanders, MD  Outpatient Specialists: pulmonology, cardiology    Brief Narrative:   From admission h and p [name] is a 81 y.o. female with medical history significant of severe COPD c/b chronic hypoxic and hypercapnic respiratory failure on 4 L [place] O2, severe pulmonary hypertension, systolic CHF, atrial fibrillation on Eliquis, peripheral artery disease, NSCLC s/p XRT/chemotherapy in remission, OSA not on CPAP, who presents to the ED with shortness of breath.   [name] states she has been experiencing a gradual decline over the last 6 months and has noticed she has become increasingly weak due to sedentary lifestyle at her ALF.  During this time, she has experienced increased dyspnea on exertion.  She has noticed a marked worsening in her work of breathing over the last week ago.  She endorses increased shortness of breath only, denying any cough or sputum production.  She denies any fever, chills, congestion, recent nausea or vomiting, diarrhea, abdominal pain, urinary or bowel changes, or focal weakness.   Per chart review, patient followed up with Dr. [name] at [facility] pulmonology on 9/19.  At that time, she was saturating at 78% on 4 L nasal cannula O2.  She was increased to 6 L at that time with improvement in saturation to 89% she was in atrial fibrillation with RVR with blood pressure of 95/58.  At that time, she was suspected to be experiencing acute on chronic CHF.  However patient declined evaluation in the ED.  It was recommended at that time that her baseline O2 be increased to 6 L; unclear if this change was made at patient's ALF.  Assessment & Plan:   Principal Problem:   Acute on chronic respiratory failure with hypoxia (HCC) Active Problems:   COPD (chronic obstructive pulmonary disease) (HCC)   Pleural effusion on right   Hypothyroid   AKI (acute kidney injury)  ([employer])   Obstructive sleep apnea   Chronic systolic CHF (congestive heart failure) (HCC)   Pulmonary hypertension (HCC)   Hyperkalemia   Demand ischemia   Permanent atrial fibrillation (HCC)   Physical deconditioning   Thrombocytopenia (HCC)   HTN (hypertension)  # Acute on chronic hypoxic respiratory failure # COPD # Pleural effusion Baseline at least 4 L need, here requiring high flow. Multifactorial, 2/2 copd, osa, dCHF, phtn. No PE on CT imaging. Does have moderate pleural effusion. No fever or cough or abrupt changes to suggest copd exacerbation though course of steroids may help. No chest pain. Does not appear to be chf exacerbation. 500 cc thoracentesis 10/12, transudate. Covid negative. Followed by palliative as outpt. Pulm had advised 6 L at last visit. Pulm consulted, advises palliative, says nothing more to be done. Down to 8 L HF [place] today - wean steroids, duonebs - continue oxygen - palliative consulted, continues to want to treat the treatable, no changes for now to code status (partial, no intubation) - f/u pleural fluid cytology and culture  # Debility Resides in ALF but they are refusing to take her back - ALF representative will come tomorrow to assess. TOC also looking into SNF options  # AKI  Cr 1.6 from baseline of around 1. Does not appear to be significantly fluid overloaded. resolved with holding home torsemide and with gentle hydration - resumed torsemide - monitor  # HFpEF Appears relatively compensated - resumed home torsemide but have decreased frequency from twice to once daily  #  A-fib Rate conrolled - cont home apixaban, digoxin - increased metop to 25 bid - resumed torsemide as above  # Hypothyroid - cont home synthroid   DVT prophylaxis: apixaban Code Status: partial (no intubation) Family Communication: daughters updated telephonically 10/13  Level of care: Progressive Status is: Inpatient Remains inpatient appropriate because: severity  of illness    Consultants:  pulmonology  Procedures: Thoracentesis  Antimicrobials:  S/p one dose ceftriaxone    Subjective: This morning breathing stable from yesterday, no chest pain, fever, or cough  Objective: Vitals:   04/06/22 0000 04/06/22 0356 04/06/22 0500 04/06/22 0801  BP: (!) 107/58 (!) 112/94  112/69  Pulse: 88 93 71 60  Resp: 17 19 (!) 21 17  Temp: 97.8 F (36.6 C) 98.3 F (36.8 C)  (!) 97.3 F (36.3 C)  TempSrc: Axillary Axillary  Oral  SpO2: 95% 95% 97% 96%  Weight:   106.1 kg   Height:        Intake/Output Summary (Last 24 hours) at 04/06/2022 1125 Last data filed at 04/05/2022 2117 Gross per 24 hour  Intake 720 ml  Output 1500 ml  Net -780 ml   Filed Weights   04/02/22 1305 04/06/22 0500  Weight: 90.7 kg 106.1 kg    Examination:  General exam: Appears chronically ill Respiratory system:  rales at bases Cardiovascular system: S1 & S2 heard, RRR. No JVD, murmurs, rubs, gallops or clicks.   Gastrointestinal system: Abdomen is obese, soft and nontender. No organomegaly or masses felt. Normal bowel sounds heard. Central nervous system: Alert and oriented. No focal neurological deficits. Extremities: Symmetric 5 x 5 power. 1+ LE edema Skin: No rashes  Psychiatry: Judgement and insight appear normal. Mood & affect appropriate.     Data Reviewed: I have personally reviewed following labs and imaging studies  CBC: Recent Labs  Lab 04/03/22 0129 04/03/22 0421 04/04/22 0616 04/05/22 0508 04/06/22 0502  WBC 5.0 5.0 9.2 8.8 7.1  NEUTROABS 4.5 4.2  --   --   --   HGB 15.1* 15.4* 14.3 14.9 14.5  HCT 48.4* 50.4* 45.0 47.1* 46.3*  MCV 110.0* 111.5* 107.1* 109.5* 110.5*  PLT 94* 100* 101* 107* 423*   Basic Metabolic Panel: Recent Labs  Lab 04/03/22 0129 04/03/22 0421 04/04/22 0616 04/05/22 0508 04/06/22 0502  NA 139 139 139 138 141  K 4.4 4.2 4.5 4.4 3.8  CL 97* 98 100 100 101  CO2 29 30 32 29 32  GLUCOSE 210* 199* 140* 103* 99   BUN 31* 30* 34* 30* 29*  CREATININE 1.63* 1.65* 1.09* 0.94 0.94  CALCIUM 8.6* 8.7* 9.2 8.9 8.7*  MG  --   --  2.3  --   --   PHOS  --   --  4.0  --   --    GFR: Estimated Creatinine Clearance: 49.9 mL/min (by C-G formula based on SCr of 0.94 mg/dL). Liver Function Tests: Recent Labs  Lab 04/03/22 0435  PROT 6.8   No results for input(s): "LIPASE", "AMYLASE" in the last 168 hours. No results for input(s): "AMMONIA" in the last 168 hours. Coagulation Profile: No results for input(s): "INR", "PROTIME" in the last 168 hours. Cardiac Enzymes: No results for input(s): "CKTOTAL", "CKMB", "CKMBINDEX", "TROPONINI" in the last 168 hours. BNP (last 3 results) No results for input(s): "PROBNP" in the last 8760 hours. HbA1C: No results for input(s): "HGBA1C" in the last 72 hours. CBG: No results for input(s): "GLUCAP" in the last 168 hours. Lipid Profile: No  results for input(s): "CHOL", "HDL", "LDLCALC", "TRIG", "CHOLHDL", "LDLDIRECT" in the last 72 hours. Thyroid Function Tests: No results for input(s): "TSH", "T4TOTAL", "FREET4", "T3FREE", "THYROIDAB" in the last 72 hours. Anemia Panel: Recent Labs    04/05/22 0503  VITAMINB12 918*   Urine analysis:    Component Value Date/Time   COLORURINE YELLOW 11/20/2021 [place] 11/20/2021 1420   LABSPEC 1.006 11/20/2021 1420   PHURINE 6.0 11/20/2021 1420   GLUCOSEU NEGATIVE 11/20/2021 1420   HGBUR NEGATIVE 11/20/2021 1420   BILIRUBINUR NEGATIVE 07/15/2021 0644   KETONESUR NEGATIVE 11/20/2021 1420   PROTEINUR NEGATIVE 11/20/2021 1420   UROBILINOGEN 1.0 05/29/2009 2253   NITRITE NEGATIVE 11/20/2021 1420   LEUKOCYTESUR TRACE (A) 11/20/2021 1420   Sepsis Labs: @LABRCNTIP (procalcitonin:4,lacticidven:4)  ) Recent Results (from the past 240 hour(s))  Culture, blood (single)     Status: None (Preliminary result)   Collection Time: 04/02/22  1:40 PM   Specimen: BLOOD  Result Value Ref Range Status   Specimen  Description BLOOD RIGHT ANTECUBITAL  Final   Special Requests   Final    BOTTLES DRAWN AEROBIC ONLY Blood Culture adequate volume   Culture   Final    NO GROWTH 4 DAYS Performed at [facility], [address] [place], [place] [address]    Report Status PENDING  Incomplete  Blood culture (single)     Status: None (Preliminary result)   Collection Time: 04/02/22  3:15 PM   Specimen: BLOOD  Result Value Ref Range Status   Specimen Description BLOOD BLOOD LEFT ARM  Final   Special Requests   Final    BOTTLES DRAWN AEROBIC AND ANAEROBIC Blood Culture results may not be optimal due to an excessive volume of blood received in culture bottles   Culture   Final    NO GROWTH 4 DAYS Performed at [facility], [address] [place],  [address]    Report Status PENDING  Incomplete  SARS Coronavirus 2 by RT PCR (hospital order, performed in [place] hospital lab) *cepheid single result test* Anterior Nasal Swab     Status: None   Collection Time: 04/03/22  8:45 AM   Specimen: Anterior Nasal Swab  Result Value Ref Range Status   SARS Coronavirus 2 by RT PCR NEGATIVE NEGATIVE Final    Comment: (NOTE) SARS-CoV-2 target nucleic acids are NOT DETECTED.  The SARS-CoV-2 RNA is generally detectable in upper and lower respiratory specimens during the acute phase of infection. The lowest concentration of SARS-CoV-2 viral copies this assay can detect is 250 copies / mL. A negative result does not preclude SARS-CoV-2 infection and should not be used as the sole basis for treatment or other patient management decisions.  A negative result may occur with improper specimen collection / handling, submission of specimen other than nasopharyngeal swab, presence of viral mutation(s) within the areas targeted by this assay, and inadequate number of viral copies (<250 copies / mL). A negative result must be combined with clinical observations, patient history, and epidemiological  information.  Fact Sheet for Patients:   https://www.patel.info/  Fact Sheet for Healthcare Providers: https://hall.com/  This test is not yet approved or  cleared by the [place] FDA and has been authorized for detection and/or diagnosis of SARS-CoV-2 by FDA under an Emergency Use Authorization (EUA).  This EUA will remain in effect (meaning this test can be used) for the duration of the COVID-19 declaration under Section 564(b)(1) of the Act, 21 U.S.C. section 360bbb-3(b)(1),  unless the authorization is terminated or revoked sooner.  Performed at [facility], [address] [place], [place] [address]   Body fluid culture w Gram Stain     Status: None (Preliminary result)   Collection Time: 04/03/22  3:40 PM   Specimen: PATH Cytology Pleural fluid  Result Value Ref Range Status   Specimen Description   Final    PLEURAL Performed at [facility], [address], [place], [place] [address]    Special Requests   Final    NONE Performed at [facility], [address] [place], [place] [address]    Gram Stain   Final    CYTOSPIN SMEAR WBC PRESENT, PREDOMINANTLY MONONUCLEAR NO ORGANISMS SEEN    Culture   Final    NO GROWTH 3 DAYS Performed at [facility], [place] [address], [place], [place] [address]    Report Status PENDING  Incomplete         Radiology Studies: No results found.      Scheduled Meds:  apixaban  2.5 mg Oral BID   cholecalciferol  1,000 Units Oral Daily   citalopram  20 mg Oral Daily   digoxin  0.0625 mg Oral Daily   feeding supplement  1 Container Oral BID BM   gabapentin  100 mg Oral TID   ipratropium-albuterol  3 mL Nebulization BID   levothyroxine  88 mcg Oral Q0600   melatonin  5 mg Oral QHS   metoprolol tartrate  25 mg Oral BID   multivitamin with minerals  1 tablet Oral Daily   pantoprazole  40 mg Oral Daily   torsemide  40 mg Oral Daily   vitamin  B-12  100 mcg Oral Daily   Continuous Infusions:   LOS: 4 days     Desma Maxim, MD [facility]   If 7PM-7AM, please contact night-coverage [email or web address] Password [email or web address] 04/06/2022, 11:25 AM

## 2022-04-06 NOTE — Plan of Care (Signed)
Patient remains on 8 L/min of supplemental O2 via HFNC; just barely maintaining SPO2 goal of 88%. Per report, on-going nosebleeds today continuing from last night. BLE edema grossly unchanged from yesterday. No obvious acute respiratory distress.   Problem: Education: Goal: Knowledge of General Education information will improve Description: Including pain rating scale, medication(s)/side effects and non-pharmacologic comfort measures Outcome: Not Progressing   Problem: Health Behavior/Discharge Planning: Goal: Ability to manage health-related needs will improve Outcome: Not Progressing   Problem: Clinical Measurements: Goal: Ability to maintain clinical measurements within normal limits will improve Outcome: Not Progressing Goal: Will remain free from infection Outcome: Not Progressing Goal: Diagnostic test results will improve Outcome: Not Progressing Goal: Respiratory complications will improve Outcome: Not Progressing Goal: Cardiovascular complication will be avoided Outcome: Not Progressing   Problem: Activity: Goal: Risk for activity intolerance will decrease Outcome: Not Progressing   Problem: Nutrition: Goal: Adequate nutrition will be maintained Outcome: Not Progressing   Problem: Coping: Goal: Level of anxiety will decrease Outcome: Not Progressing   Problem: Elimination: Goal: Will not experience complications related to bowel motility Outcome: Not Progressing Goal: Will not experience complications related to urinary retention Outcome: Not Progressing   Problem: Pain Managment: Goal: General experience of comfort will improve Outcome: Not Progressing   Problem: Safety: Goal: Ability to remain free from injury will improve Outcome: Not Progressing   Problem: Skin Integrity: Goal: Risk for impaired skin integrity will decrease Outcome: Not Progressing

## 2022-04-07 ENCOUNTER — Inpatient Hospital Stay

## 2022-04-07 DIAGNOSIS — J9621 Acute and chronic respiratory failure with hypoxia: Secondary | ICD-10-CM | POA: Diagnosis not present

## 2022-04-07 LAB — BASIC METABOLIC PANEL
Anion gap: 7 (ref 5–15)
BUN: 30 mg/dL — ABNORMAL HIGH (ref 8–23)
CO2: 36 mmol/L — ABNORMAL HIGH (ref 22–32)
Calcium: 8.7 mg/dL — ABNORMAL LOW (ref 8.9–10.3)
Chloride: 101 mmol/L (ref 98–111)
Creatinine, Ser: 0.99 mg/dL (ref 0.44–1.00)
GFR, Estimated: 57 mL/min — ABNORMAL LOW (ref >60)
Glucose, Bld: 94 mg/dL (ref 70–99)
Potassium: 3.9 mmol/L (ref 3.5–5.1)
Sodium: 144 mmol/L (ref 135–145)

## 2022-04-07 LAB — CULTURE, BLOOD (SINGLE)
Culture: NO GROWTH
Culture: NO GROWTH
Special Requests: ADEQUATE

## 2022-04-07 LAB — BRAIN NATRIURETIC PEPTIDE: B Natriuretic Peptide: 550.8 pg/mL — ABNORMAL HIGH (ref 0.0–100.0)

## 2022-04-07 LAB — CBC
HCT: 47.1 % — ABNORMAL HIGH (ref 36.0–46.0)
Hemoglobin: 14.6 g/dL (ref 12.0–15.0)
MCH: 34.5 pg — ABNORMAL HIGH (ref 26.0–34.0)
MCHC: 31 g/dL (ref 30.0–36.0)
MCV: 111.3 fL — ABNORMAL HIGH (ref 80.0–100.0)
Platelets: 99 10*3/uL — ABNORMAL LOW (ref 150–400)
RBC: 4.23 MIL/uL (ref 3.87–5.11)
RDW: 14.3 % (ref 11.5–15.5)
WBC: 7 10*3/uL (ref 4.0–10.5)
nRBC: 0.4 % — ABNORMAL HIGH (ref 0.0–0.2)

## 2022-04-07 LAB — BODY FLUID CULTURE W GRAM STAIN: Culture: NO GROWTH

## 2022-04-07 LAB — CYTOLOGY - NON PAP

## 2022-04-07 LAB — LACTIC ACID, PLASMA: Lactic Acid, Venous: 2.2 mmol/L (ref 0.5–1.9)

## 2022-04-07 LAB — DIGOXIN LEVEL: Digoxin Level: 0.4 ng/mL — ABNORMAL LOW (ref 0.8–2.0)

## 2022-04-07 MED ORDER — METOPROLOL TARTRATE 25 MG PO TABS
25.0000 mg | ORAL_TABLET | Freq: Two times a day (BID) | ORAL | Status: DC
  Administered 2022-04-07 – 2022-04-17 (×19): 25 mg via ORAL
  Filled 2022-04-07 (×21): qty 1

## 2022-04-07 MED ORDER — TORSEMIDE 20 MG PO TABS
40.0000 mg | ORAL_TABLET | Freq: Two times a day (BID) | ORAL | Status: DC
  Administered 2022-04-07 – 2022-04-09 (×4): 40 mg via ORAL
  Filled 2022-04-07 (×4): qty 2

## 2022-04-07 MED ORDER — METOPROLOL TARTRATE 25 MG PO TABS: 12.5000 mg | ORAL_TABLET | Freq: Two times a day (BID) | ORAL | Status: DC

## 2022-04-07 NOTE — Progress Notes (Addendum)
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 Patient began experiencing worsening respiratory distress at her facility and was evaluated by [facility] nurse. Decision was made in part due to her code status as well as per request of patient to have her evaluated in ED. She is admitted with a diagnosis of acute on chronic respiratory failure. Per Dr. Cherie Ouch with [facility] this is a related hospice admission.   Visited at bedside, [name] continues to have increased oxygen requirements from her base line. Currently on 8 liters HFNC. She is short of breath with conversation. Chest xray done just prior to RN visit due to decreased saturations earlier this morning. She is not able to wean down her oxygen. She reports that she realizes she is not getting better. She remains in patient due to the severity of her illness and continued high oxygen demand.   Vital signs:  97.5 oral, 97/74, 105, 20 Oxygen saturation 93 on 8 L HFNC I/O 480/1200 Abnormal labs: B Natruretic Peptide 550.8, lactic acid 2.2 , BUN 30, Ca 8.7, GFR 57 Diagnostics:IMPRESSION:Increased bilateral interstitial opacities, likely due to worsening pulmonary edema. IV/PRN: N/A  Problem List: # Pleural effusion Baseline at least 4 L need, here requiring high flow. Multifactorial, 2/2 copd, osa, dCHF, phtn. No PE on CT imaging. Does have moderate pleural effusion. No fever or cough or abrupt changes to suggest copd exacerbation though course of steroids may help. No chest pain. Does not appear to be chf exacerbation. 500 cc thoracentesis 10/12, transudate. Covid negative. Followed by palliative as outpt. Pulm had advised 6 L at last visit. Pulm consulted, advises palliative, says nothing more to be done. This morning reports breathing stable but sats are in the 70s on 8 liters. - cont steroids, duonebs -  continue oxygen, rT to see - check CXR, bnp, vbg now - palliative consulted, continues to want to treat the treatable, no changes for now to code status (partial, no intubation) - f/u pleural fluid cytology and culture # Debility Resides in ALF but they are refusing to take her back - ALF representative will come tomorrow to assess. TOC also looking into SNF options # AKI  Cr 1.6 from baseline of around 1. Does not appear to be significantly fluid overloaded. resolved with holding home torsemide and with gentle hydration - resumed torsemide - monitor # HFpEF Appears relatively compensated - resumed home torsemide but have decreased frequency from twice to once daily # A-fib Rate conrolled - cont home apixaban, digoxin - cont metop 25 bid (increased this admission for better rate control) - resumed torsemide as above # Hypothyroid - cont home synthroid  Discharge planning: Ongoing, may not be able to return to ALF Family: Spoke with patient today IDT: Updated GOC: Ongoing, remains a partial code, patient continues to desire treatment for current exacerbation. CPR, no intubation.  Flo Shanks BSN, [employer] [phone]

## 2022-04-07 NOTE — Progress Notes (Signed)
PT Cancellation Note  Patient Details Name: [name] MRN: [id number] DOB: Jun 03, 1941   Cancelled Treatment:     Pt declined due to fatigue. Per nursing, pt sat up in chair earlier today and had just returned to bed. Will continue next available date/time.   Josie Dixon 04/07/2022, 12:56 PM

## 2022-04-07 NOTE — Plan of Care (Signed)
  Problem: Clinical Measurements: Goal: Ability to maintain clinical measurements within normal limits will improve Outcome: Progressing Goal: Diagnostic test results will improve Outcome: Progressing Goal: Respiratory complications will improve Outcome: Progressing Goal: Cardiovascular complication will be avoided Outcome: Progressing   

## 2022-04-07 NOTE — Progress Notes (Signed)
Occupational Therapy Treatment Patient Details Name: [name] MRN: [id number] DOB: 11-01-1940 Today's Date: 04/07/2022   History of present illness [name] is a 81 y.o. female with medical history significant of severe COPD c/b chronic hypoxic and hypercapnic respiratory failure on 4 L [place] O2, severe pulmonary hypertension, systolic CHF, atrial fibrillation on Eliquis, peripheral artery disease, NSCLC s/p XRT/chemotherapy in remission, OSA not on CPAP, who presents to the ED with shortness of breath.   OT comments  Pt seen for OT tx this date. Pt in bed attempting to eat lunch, poor posture/positioning for accessing tray. Pt agreeable to repositioning. Pt declined EOB/OOB, but was able to assist with boosting herself up with [facility] lowered and cues for hand/foot placement. Pt required assist with set up for meal tray after endorsing she was having difficulty with cutting her food. HR 80-100's (Afib), SpO2 89-94% on 8L (questionable pleth). Pt denied SOB. Pt continues to benefit from skilled OT services.    Recommendations for follow up therapy are one component of a multi-disciplinary discharge planning process, led by the attending physician.  Recommendations may be updated based on patient status, additional functional criteria and insurance authorization.    Follow Up Recommendations  Home health OT    Assistance Recommended at Discharge Frequent or constant Supervision/Assistance  Patient can return home with the following  A little help with walking and/or transfers;A little help with bathing/dressing/bathroom;Assist for transportation;Help with stairs or ramp for entrance   Equipment Recommendations  Other (comment) (2WW)    Recommendations for Other Services      Precautions / Restrictions Precautions Precautions: Fall Restrictions Weight Bearing Restrictions: No       Mobility Bed Mobility Overal bed mobility: Needs Assistance             General bed  mobility comments: Pt required MOD A for repositioning and scooting up in the bed, declined EOB/OOB    Transfers                         Balance                                           ADL either performed or assessed with clinical judgement   ADL Overall ADL's : Needs assistance/impaired Eating/Feeding: Sitting;Set up Eating/Feeding Details (indicate cue type and reason): pt required assist for cutting her meal and assist for improving upright long sitting positioning for improved safety and comfort while eating                                        Extremity/Trunk Assessment              Vision       Perception     Praxis      Cognition Arousal/Alertness: Awake/alert Behavior During Therapy: WFL for tasks assessed/performed Overall Cognitive Status: Within Functional Limits for tasks assessed                                          Exercises      Shoulder Instructions       General Comments  Pertinent Vitals/ Pain       Pain Assessment Pain Assessment: No/denies pain  Home Living                                          Prior Functioning/Environment              Frequency  Min 2X/week        Progress Toward Goals  OT Goals(current goals can now be found in the care plan section)  Progress towards OT goals: Progressing toward goals  Acute Rehab OT Goals Patient Stated Goal: to return home OT Goal Formulation: With patient Time For Goal Achievement: 04/17/22 Potential to Achieve Goals: [place] Discharge plan remains appropriate;Frequency remains appropriate    Co-evaluation                 AM-PAC OT "6 Clicks" Daily Activity     Outcome Measure   Help from another person eating meals?: None Help from another person taking care of personal grooming?: None Help from another person toileting, which includes using toliet, bedpan, or  urinal?: A Little Help from another person bathing (including washing, rinsing, drying)?: A Little Help from another person to put on and taking off regular upper body clothing?: None Help from another person to put on and taking off regular lower body clothing?: A Little 6 Click Score: 21    End of Session Equipment Utilized During Treatment: Oxygen  OT Visit Diagnosis: Unsteadiness on feet (R26.81);Muscle weakness (generalized) (M62.81)   Activity Tolerance Patient tolerated treatment well   Patient Left in bed;with call bell/phone within reach;with bed alarm set   Nurse Communication          Time: 6503-5465 OT Time Calculation (min): 10 min  Charges: OT General Charges $OT Visit: 1 Visit OT Treatments $Self Care/Home Management : 8-22 mins  Ardeth Perfect., MPH, MS, OTR/L ascom [phone] 04/07/22, 2:22 PM

## 2022-04-07 NOTE — Progress Notes (Signed)
PROGRESS NOTE    [name]  BJS:[id number] DOB: 01-13-1941 DOA: 04/02/2022 PCP: Jinny Sanders, MD  Outpatient Specialists: pulmonology, cardiology    Brief Narrative:   From admission h and p [name] is a 81 y.o. female with medical history significant of severe COPD c/b chronic hypoxic and hypercapnic respiratory failure on 4 L [place] O2, severe pulmonary hypertension, systolic CHF, atrial fibrillation on Eliquis, peripheral artery disease, NSCLC s/p XRT/chemotherapy in remission, OSA not on CPAP, who presents to the ED with shortness of breath.   [name] states she has been experiencing a gradual decline over the last 6 months and has noticed she has become increasingly weak due to sedentary lifestyle at her ALF.  During this time, she has experienced increased dyspnea on exertion.  She has noticed a marked worsening in her work of breathing over the last week ago.  She endorses increased shortness of breath only, denying any cough or sputum production.  She denies any fever, chills, congestion, recent nausea or vomiting, diarrhea, abdominal pain, urinary or bowel changes, or focal weakness.   Per chart review, patient followed up with Dr. [name] at [facility] pulmonology on 9/19.  At that time, she was saturating at 78% on 4 L nasal cannula O2.  She was increased to 6 L at that time with improvement in saturation to 89% she was in atrial fibrillation with RVR with blood pressure of 95/58.  At that time, she was suspected to be experiencing acute on chronic CHF.  However patient declined evaluation in the ED.  It was recommended at that time that her baseline O2 be increased to 6 L; unclear if this change was made at patient's ALF.  Assessment & Plan:   Principal Problem:   Acute on chronic respiratory failure with hypoxia (HCC) Active Problems:   COPD (chronic obstructive pulmonary disease) (HCC)   Pleural effusion on right   Hypothyroid   AKI (acute kidney injury)  ([place])   Obstructive sleep apnea   Chronic systolic CHF (congestive heart failure) (HCC)   Pulmonary hypertension (HCC)   Hyperkalemia   Demand ischemia   Permanent atrial fibrillation (HCC)   Physical deconditioning   Thrombocytopenia (HCC)   HTN (hypertension)  # Acute on chronic hypoxic respiratory failure # COPD # Pleural effusion Baseline at least 4 L need, here requiring high flow. Multifactorial, 2/2 copd, osa, dCHF, phtn. No PE on CT imaging. Does have moderate pleural effusion. No fever or cough or abrupt changes to suggest copd exacerbation though course of steroids may help. No chest pain. Does not appear to be chf exacerbation. 500 cc thoracentesis 10/12, transudate. Covid negative. Followed by palliative as outpt. Pulm had advised 6 L at last visit. Pulm consulted, advises palliative, says nothing more to be done. This morning reports breathing stable but sats are in the 70s on 8 liters. - cont steroids, duonebs - continue oxygen, rT to see - check CXR, bnp, vbg now - palliative consulted, continues to want to treat the treatable, no changes for now to code status (partial, no intubation) - f/u pleural fluid cytology and culture  # Debility Resides in ALF but they are refusing to take her back - ALF representative will come tomorrow to assess. TOC also looking into SNF options  # AKI  Cr 1.6 from baseline of around 1. Does not appear to be significantly fluid overloaded. resolved with holding home torsemide and with gentle hydration - resumed torsemide - monitor  # HFpEF  Appears relatively compensated - resumed home torsemide but have decreased frequency from twice to once daily  # A-fib Rate conrolled - cont home apixaban, digoxin - cont metop 25 bid (increased this admission for better rate control) - resumed torsemide as above  # Hypothyroid - cont home synthroid   DVT prophylaxis: apixaban Code Status: partial (no intubation) Family Communication:  daughters updated telephonically 10/16  Level of care: Progressive Status is: Inpatient Remains inpatient appropriate because: severity of illness    Consultants:  pulmonology  Procedures: Thoracentesis 10/12  Antimicrobials:  S/p one dose ceftriaxone    Subjective: This morning breathing stable from yesterday, no chest pain, fever, or cough  Objective: Vitals:   04/07/22 0440 04/07/22 0806 04/07/22 0843 04/07/22 0907  BP: 115/80 123/81  97/74  Pulse: 66 85 86 97  Resp: 20 (!) 26 (!) 24 20  Temp: 97.7 F (36.5 C) (!) 97.5 F (36.4 C)  (!) 97.5 F (36.4 C)  TempSrc: Oral Oral  Oral  SpO2: 95% 96% 95% 93%  Weight:      Height:        Intake/Output Summary (Last 24 hours) at 04/07/2022 1059 Last data filed at 04/07/2022 0900 Gross per 24 hour  Intake 480 ml  Output 1200 ml  Net -720 ml   Filed Weights   04/02/22 1305 04/06/22 0500  Weight: 90.7 kg 106.1 kg    Examination:  General exam: Appears chronically ill Respiratory system:  rales at bases Cardiovascular system: S1 & S2 heard, RRR. No JVD, murmurs, rubs, gallops or clicks.   Gastrointestinal system: Abdomen is obese, soft and nontender. No organomegaly or masses felt. Normal bowel sounds heard. Central nervous system: Alert and oriented. No focal neurological deficits. Extremities: Symmetric 5 x 5 power. 1+ LE edema Skin: No rashes  Psychiatry: Judgement and insight appear normal. Mood & affect appropriate.     Data Reviewed: I have personally reviewed following labs and imaging studies  CBC: Recent Labs  Lab 04/03/22 0129 04/03/22 0421 04/04/22 0616 04/05/22 0508 04/06/22 0502  WBC 5.0 5.0 9.2 8.8 7.1  NEUTROABS 4.5 4.2  --   --   --   HGB 15.1* 15.4* 14.3 14.9 14.5  HCT 48.4* 50.4* 45.0 47.1* 46.3*  MCV 110.0* 111.5* 107.1* 109.5* 110.5*  PLT 94* 100* 101* 107* 361*   Basic Metabolic Panel: Recent Labs  Lab 04/03/22 0421 04/04/22 0616 04/05/22 0508 04/06/22 0502 04/07/22 0529   NA 139 139 138 141 144  K 4.2 4.5 4.4 3.8 3.9  CL 98 100 100 101 101  CO2 30 32 29 32 36*  GLUCOSE 199* 140* 103* 99 94  BUN 30* 34* 30* 29* 30*  CREATININE 1.65* 1.09* 0.94 0.94 0.99  CALCIUM 8.7* 9.2 8.9 8.7* 8.7*  MG  --  2.3  --   --   --   PHOS  --  4.0  --   --   --    GFR: Estimated Creatinine Clearance: 47.4 mL/min (by C-G formula based on SCr of 0.99 mg/dL). Liver Function Tests: Recent Labs  Lab 04/03/22 0435  PROT 6.8   No results for input(s): "LIPASE", "AMYLASE" in the last 168 hours. No results for input(s): "AMMONIA" in the last 168 hours. Coagulation Profile: No results for input(s): "INR", "PROTIME" in the last 168 hours. Cardiac Enzymes: No results for input(s): "CKTOTAL", "CKMB", "CKMBINDEX", "TROPONINI" in the last 168 hours. BNP (last 3 results) No results for input(s): "PROBNP" in the last 8760 hours. HbA1C:  No results for input(s): "HGBA1C" in the last 72 hours. CBG: No results for input(s): "GLUCAP" in the last 168 hours. Lipid Profile: No results for input(s): "CHOL", "HDL", "LDLCALC", "TRIG", "CHOLHDL", "LDLDIRECT" in the last 72 hours. Thyroid Function Tests: No results for input(s): "TSH", "T4TOTAL", "FREET4", "T3FREE", "THYROIDAB" in the last 72 hours. Anemia Panel: Recent Labs    04/05/22 0503  VITAMINB12 918*   Urine analysis:    Component Value Date/Time   COLORURINE YELLOW 11/20/2021 [place] 11/20/2021 1420   LABSPEC 1.006 11/20/2021 1420   PHURINE 6.0 11/20/2021 1420   GLUCOSEU NEGATIVE 11/20/2021 1420   HGBUR NEGATIVE 11/20/2021 1420   BILIRUBINUR NEGATIVE 07/15/2021 0644   KETONESUR NEGATIVE 11/20/2021 1420   PROTEINUR NEGATIVE 11/20/2021 1420   UROBILINOGEN 1.0 05/29/2009 2253   NITRITE NEGATIVE 11/20/2021 1420   LEUKOCYTESUR TRACE (A) 11/20/2021 1420   Sepsis Labs: @LABRCNTIP (procalcitonin:4,lacticidven:4)  ) Recent Results (from the past 240 hour(s))  Culture, blood (single)     Status: None    Collection Time: 04/02/22  1:40 PM   Specimen: BLOOD  Result Value Ref Range Status   Specimen Description BLOOD RIGHT ANTECUBITAL  Final   Special Requests   Final    BOTTLES DRAWN AEROBIC ONLY Blood Culture adequate volume   Culture   Final    NO GROWTH 5 DAYS Performed at [facility], [address] [place], [place] [address]    Report Status 04/07/2022 FINAL  Final  Blood culture (single)     Status: None   Collection Time: 04/02/22  3:15 PM   Specimen: BLOOD  Result Value Ref Range Status   Specimen Description BLOOD BLOOD LEFT ARM  Final   Special Requests   Final    BOTTLES DRAWN AEROBIC AND ANAEROBIC Blood Culture results may not be optimal due to an excessive volume of blood received in culture bottles   Culture   Final    NO GROWTH 5 DAYS Performed at [facility], [address], [place], [place] [address]    Report Status 04/07/2022 FINAL  Final  SARS Coronavirus 2 by RT PCR (hospital order, performed in [facility] hospital lab) *cepheid single result test* Anterior Nasal Swab     Status: None   Collection Time: 04/03/22  8:45 AM   Specimen: Anterior Nasal Swab  Result Value Ref Range Status   SARS Coronavirus 2 by RT PCR NEGATIVE NEGATIVE Final    Comment: (NOTE) SARS-CoV-2 target nucleic acids are NOT DETECTED.  The SARS-CoV-2 RNA is generally detectable in upper and lower respiratory specimens during the acute phase of infection. The lowest concentration of SARS-CoV-2 viral copies this assay can detect is 250 copies / mL. A negative result does not preclude SARS-CoV-2 infection and should not be used as the sole basis for treatment or other patient management decisions.  A negative result may occur with improper specimen collection / handling, submission of specimen other than nasopharyngeal swab, presence of viral mutation(s) within the areas targeted by this assay, and inadequate number of viral copies (<250 copies / mL). A negative  result must be combined with clinical observations, patient history, and epidemiological information.  Fact Sheet for Patients:   https://www.patel.info/  Fact Sheet for Healthcare Providers: https://hall.com/  This test is not yet approved or  cleared by the [place] FDA and has been authorized for detection and/or diagnosis of SARS-CoV-2 by FDA under an Emergency Use Authorization (EUA).  This EUA will remain in effect (meaning  this test can be used) for the duration of the COVID-19 declaration under Section 564(b)(1) of the Act, 21 U.S.C. section 360bbb-3(b)(1), unless the authorization is terminated or revoked sooner.  Performed at [facility], [address] [place], [place] [address]   Body fluid culture w Gram Stain     Status: None   Collection Time: 04/03/22  3:40 PM   Specimen: PATH Cytology Pleural fluid  Result Value Ref Range Status   Specimen Description   Final    PLEURAL Performed at [facility], [address], [place], [place] [address]    Special Requests   Final    NONE Performed at [facility], [address] [place], [place] [address]    Gram Stain   Final    CYTOSPIN SMEAR WBC PRESENT, PREDOMINANTLY MONONUCLEAR NO ORGANISMS SEEN    Culture   Final    NO GROWTH 3 DAYS Performed at [facility], [place] [address], [place], [place] [address]    Report Status 04/07/2022 FINAL  Final         Radiology Studies: No results found.      Scheduled Meds:  apixaban  2.5 mg Oral BID   cholecalciferol  1,000 Units Oral Daily   citalopram  20 mg Oral Daily   digoxin  0.0625 mg Oral Daily   feeding supplement  1 Container Oral BID BM   gabapentin  100 mg Oral TID   ipratropium-albuterol  3 mL Nebulization BID   levothyroxine  88 mcg Oral Q0600   melatonin  5 mg Oral QHS   metoprolol tartrate  25 mg Oral BID   multivitamin with minerals  1 tablet Oral Daily    pantoprazole  40 mg Oral Daily   torsemide  40 mg Oral Daily   vitamin B-12  100 mcg Oral Daily   Continuous Infusions:   LOS: 5 days     Desma Maxim, MD [facility]   If 7PM-7AM, please contact night-coverage [email or web address] Password [email or web address] 04/07/2022, 10:59 AM

## 2022-04-08 DIAGNOSIS — J9621 Acute and chronic respiratory failure with hypoxia: Secondary | ICD-10-CM | POA: Diagnosis not present

## 2022-04-08 DIAGNOSIS — Z7189 Other specified counseling: Secondary | ICD-10-CM | POA: Diagnosis not present

## 2022-04-08 LAB — BASIC METABOLIC PANEL
Anion gap: 9 (ref 5–15)
BUN: 27 mg/dL — ABNORMAL HIGH (ref 8–23)
CO2: 37 mmol/L — ABNORMAL HIGH (ref 22–32)
Calcium: 8.8 mg/dL — ABNORMAL LOW (ref 8.9–10.3)
Chloride: 96 mmol/L — ABNORMAL LOW (ref 98–111)
Creatinine, Ser: 0.93 mg/dL (ref 0.44–1.00)
GFR, Estimated: >60 mL/min (ref >60)
Glucose, Bld: 104 mg/dL — ABNORMAL HIGH (ref 70–99)
Potassium: 3.6 mmol/L (ref 3.5–5.1)
Sodium: 142 mmol/L (ref 135–145)

## 2022-04-08 NOTE — Progress Notes (Signed)
       CROSS COVER NOTE  NAME: [name] MRN: [id number] DOB : [birth year]    Date of Service   04/08/2022   HPI/Events of Note   Notified of patient desaturation while sleep. M(r)s Kuhnert has a history of OSA and has utilized qHS CPAP during inpatient admissions this year.  Interventions   Assessment/Plan:  CPAP     This document was prepared using Dragon voice recognition software and may include unintentional dictation errors.  Neomia Glass DNP, MBA, FNP-BC Nurse Practitioner [facility] Pager [phone]

## 2022-04-08 NOTE — Plan of Care (Signed)
PCCM will sign off at this time

## 2022-04-08 NOTE — Progress Notes (Signed)
[facility] rm 246 Manufacturing engineer [facility]) Hospitalized Hospice Patient   Ms. Snodgrass is a current [facility] patient with a terminal diagnosis of hypertensive heart disease with heart failure. Patient began experiencing worsening respiratory distress at her facility and was evaluated by [facility] nurse. Decision was made in part due to her code status as well as per request of patient to have her evaluated in ED. She is admitted with a diagnosis of acute on chronic respiratory failure. Per Dr. Cherie Ouch with [facility] this is a related hospice admission.   Ms. Bothun has now had 2 days of worsening respiratory status and has met with PMT in the hospital and made decision to switch to a comfort based approach and has requested to be a DNR. She had a rough night with several episodes of oxygen saturations dropping and needing to be placed on c-pap.   Patient is inpatient appropriate while attempting to wean down high O2 demand necessitating higher level monitoring.   V/S: 97.9/96/20   113/70   93% L HFNC  I&O: 481/2625  Labs: Chloride 96, CO2 37, Glucose 104, BUN 27, Ca+ 8.8  Diagnostics: None new   IV/PRN: N/A   Problem List: # Acute on chronic hypoxic respiratory failure # COPD # Pleural effusion Baseline at least 4 L need, here requiring high flow. Multifactorial, 2/2 copd, osa, dCHF, phtn. No PE on CT imaging. Does have moderate pleural effusion. No fever or cough or abrupt changes to suggest copd exacerbation though course of steroids may help. No chest pain. Does not appear to be chf exacerbation. 500 cc thoracentesis 10/12, transudate. Covid negative. Followed by palliative as outpt. Pulm had advised 6 L at last visit. Pulm consulted, advises palliative, says nothing more to be done. This morning reports breathing stable but sats are in the 70s on 8 liters. - cont steroids, duonebs - continue oxygen, rT to see - check CXR, bnp, vbg now - palliative consulted, continues to want to treat the treatable,  no changes for now to code status (partial, no intubation) - f/u pleural fluid cytology and culture  D/C planning: Ongoing, likely back to ALF once stable  Family: Talked with daughters by phone, their goal is for patient to stabilize and return to ALF with hospice IDT: Updated GOC: Patient decided on DNR status today   Thank you, Jhonnie Garner, BSN, RN, [facility] hospital liaison [phone] or Epic chat

## 2022-04-08 NOTE — TOC Progression Note (Signed)
Transition of Care [facility]) - Progression Note    Patient Details  Name: [name] MRN: [id number] Date of Birth: 04-Feb-1941  Transition of Care [facility]) CM/SW [employer], LCSW Phone Number: 04/08/2022, 5:03 PM  Clinical Narrative:  [name] confirmed they can not accept patient back. Updated patient and daughter. Will look for LTC SNF.   Expected Discharge Plan and Services                                                 Social Determinants of Health (SDOH) Interventions    Readmission Risk Interventions     No data to display

## 2022-04-08 NOTE — Progress Notes (Signed)
PROGRESS NOTE    [name]  QBH:[id number] DOB: [birth year] DOA: 04/02/2022 PCP: Bonnie Sanders, MD  Outpatient Specialists: pulmonology, cardiology    Brief Narrative:   From admission h and p [name] is a 81 y.o. female with medical history significant of severe COPD c/b chronic hypoxic and hypercapnic respiratory failure on 4 L [place] O2, severe pulmonary hypertension, systolic CHF, atrial fibrillation on Eliquis, peripheral artery disease, NSCLC s/p XRT/chemotherapy in remission, OSA not on CPAP, who presents to the ED with shortness of breath.   [name] states she has been experiencing a gradual decline over the last 6 months and has noticed she has become increasingly weak due to sedentary lifestyle at her ALF.  During this time, she has experienced increased dyspnea on exertion.  She has noticed a marked worsening in her work of breathing over the last week ago.  She endorses increased shortness of breath only, denying any cough or sputum production.  She denies any fever, chills, congestion, recent nausea or vomiting, diarrhea, abdominal pain, urinary or bowel changes, or focal weakness.   Per chart review, patient followed up with Dr. Patsey Ball at [facility] pulmonology on 9/19.  At that time, she was saturating at 78% on 4 L nasal cannula O2.  She was increased to 6 L at that time with improvement in saturation to 89% she was in atrial fibrillation with RVR with blood pressure of 95/58.  At that time, she was suspected to be experiencing acute on chronic CHF.  However patient declined evaluation in the ED.  It was recommended at that time that her baseline O2 be increased to 6 L; unclear if this change was made at patient's ALF.  Assessment & Plan:   Principal Problem:   Acute on chronic respiratory failure with hypoxia (HCC) Active Problems:   COPD (chronic obstructive pulmonary disease) (HCC)   Pleural effusion on right   Hypothyroid   AKI (acute kidney injury)  ([place])   Obstructive sleep apnea   Chronic systolic CHF (congestive heart failure) (HCC)   Pulmonary hypertension (HCC)   Hyperkalemia   Demand ischemia   Permanent atrial fibrillation (HCC)   Physical deconditioning   Thrombocytopenia (HCC)   HTN (hypertension)  # Acute on chronic hypoxic respiratory failure # COPD # Pleural effusion Baseline at least 4 L need, here requiring high flow. Multifactorial, 2/2 copd, osa, dCHF, phtn. No PE on CT imaging. Does have moderate pleural effusion. No fever or cough or abrupt changes to suggest copd exacerbation though course of steroids may help. No chest pain. Does not appear to be chf exacerbation. 500 cc thoracentesis 10/12, transudate. Covid negative. Followed by palliative as outpt. Pulm had advised 6 L at last visit. Pulm consulted, advises palliative, says nothing more to be done. This morning reports breathing stable. - cont steroids, duonebs - continue oxygen,   - palliative consulted, code status now changed to dnr, patient is considering comfort care  # Debility Resides in ALF but they are refusing to take her back - TOC exploring options with family, likely would need to be weaned off high flow prior to d/c to snf  # AKI  Cr 1.6 from baseline of around 1. Does not appear to be significantly fluid overloaded. resolved with holding home torsemide and with gentle hydration - resumed torsemide - monitor  # HFpEF Appears relatively compensated - home torsemide initially held 2/2 aki, now resumed  # A-fib Rate conrolled - cont home apixaban, digoxin -  cont metop 25 bid (increased this admission for better rate control) - resumed torsemide as above  # Hypothyroid - cont home synthroid   DVT prophylaxis: apixaban Code Status: dnr Family Communication: daughter updated @ bedside 10/17  Level of care: Progressive Status is: Inpatient Remains inpatient appropriate because: unsafe d/c plan    Consultants:   pulmonology  Procedures: Thoracentesis 10/12  Antimicrobials:  S/p one dose ceftriaxone    Subjective: This morning breathing stable from yesterday, no chest pain, fever, or cough  Objective: Vitals:   04/08/22 0300 04/08/22 0802 04/08/22 0807 04/08/22 1150  BP:  103/74  113/70  Pulse: 89 100 95 96  Resp: (!) 21 20 20 20   Temp:  97.8 F (36.6 C)  97.9 F (36.6 C)  TempSrc:  Oral  Oral  SpO2: 96% 91% 91% 93%  Weight:      Height:        Intake/Output Summary (Last 24 hours) at 04/08/2022 1537 Last data filed at 04/08/2022 1151 Gross per 24 hour  Intake 480 ml  Output 3625 ml  Net -3145 ml   Filed Weights   04/02/22 1305 04/06/22 0500  Weight: 90.7 kg 106.1 kg    Examination:  General exam: Appears chronically ill Respiratory system:  rales at bases Cardiovascular system: S1 & S2 heard, RRR. No JVD, murmurs, rubs, gallops or clicks.   Gastrointestinal system: Abdomen is obese, soft and nontender. No organomegaly or masses felt. Normal bowel sounds heard. Central nervous system: Alert and oriented. No focal neurological deficits. Extremities: Symmetric 5 x 5 power. 1+ LE edema Skin: No rashes  Psychiatry: Judgement and insight appear normal. Mood & affect appropriate.     Data Reviewed: I have personally reviewed following labs and imaging studies  CBC: Recent Labs  Lab 04/03/22 0129 04/03/22 0421 04/04/22 0616 04/05/22 0508 04/06/22 0502 04/07/22 1132  WBC 5.0 5.0 9.2 8.8 7.1 7.0  NEUTROABS 4.5 4.2  --   --   --   --   HGB 15.1* 15.4* 14.3 14.9 14.5 14.6  HCT 48.4* 50.4* 45.0 47.1* 46.3* 47.1*  MCV 110.0* 111.5* 107.1* 109.5* 110.5* 111.3*  PLT 94* 100* 101* 107* 103* 99*   Basic Metabolic Panel: Recent Labs  Lab 04/04/22 0616 04/05/22 0508 04/06/22 0502 04/07/22 0529 04/08/22 0610  NA 139 138 141 144 142  K 4.5 4.4 3.8 3.9 3.6  CL 100 100 101 101 96*  CO2 32 29 32 36* 37*  GLUCOSE 140* 103* 99 94 104*  BUN 34* 30* 29* 30* 27*   CREATININE 1.09* 0.94 0.94 0.99 0.93  CALCIUM 9.2 8.9 8.7* 8.7* 8.8*  MG 2.3  --   --   --   --   PHOS 4.0  --   --   --   --    GFR: Estimated Creatinine Clearance: 50.4 mL/min (by C-G formula based on SCr of 0.93 mg/dL). Liver Function Tests: Recent Labs  Lab 04/03/22 0435  PROT 6.8   No results for input(s): "LIPASE", "AMYLASE" in the last 168 hours. No results for input(s): "AMMONIA" in the last 168 hours. Coagulation Profile: No results for input(s): "INR", "PROTIME" in the last 168 hours. Cardiac Enzymes: No results for input(s): "CKTOTAL", "CKMB", "CKMBINDEX", "TROPONINI" in the last 168 hours. BNP (last 3 results) No results for input(s): "PROBNP" in the last 8760 hours. HbA1C: No results for input(s): "HGBA1C" in the last 72 hours. CBG: No results for input(s): "GLUCAP" in the last 168 hours. Lipid Profile: No  results for input(s): "CHOL", "HDL", "LDLCALC", "TRIG", "CHOLHDL", "LDLDIRECT" in the last 72 hours. Thyroid Function Tests: No results for input(s): "TSH", "T4TOTAL", "FREET4", "T3FREE", "THYROIDAB" in the last 72 hours. Anemia Panel: No results for input(s): "VITAMINB12", "FOLATE", "FERRITIN", "TIBC", "IRON", "RETICCTPCT" in the last 72 hours.  Urine analysis:    Component Value Date/Time   COLORURINE YELLOW 11/20/2021 [place] 11/20/2021 1420   LABSPEC 1.006 11/20/2021 1420   PHURINE 6.0 11/20/2021 1420   GLUCOSEU NEGATIVE 11/20/2021 1420   HGBUR NEGATIVE 11/20/2021 1420   BILIRUBINUR NEGATIVE 07/15/2021 0644   KETONESUR NEGATIVE 11/20/2021 1420   PROTEINUR NEGATIVE 11/20/2021 1420   UROBILINOGEN 1.0 05/29/2009 2253   NITRITE NEGATIVE 11/20/2021 1420   LEUKOCYTESUR TRACE (A) 11/20/2021 1420   Sepsis Labs: @LABRCNTIP (procalcitonin:4,lacticidven:4)  ) Recent Results (from the past 240 hour(s))  Culture, blood (single)     Status: None   Collection Time: 04/02/22  1:40 PM   Specimen: BLOOD  Result Value Ref Range Status    Specimen Description BLOOD RIGHT ANTECUBITAL  Final   Special Requests   Final    BOTTLES DRAWN AEROBIC ONLY Blood Culture adequate volume   Culture   Final    NO GROWTH 5 DAYS Performed at [facility], [address] [place], [place] [address]    Report Status 04/07/2022 FINAL  Final  Blood culture (single)     Status: None   Collection Time: 04/02/22  3:15 PM   Specimen: BLOOD  Result Value Ref Range Status   Specimen Description BLOOD BLOOD LEFT ARM  Final   Special Requests   Final    BOTTLES DRAWN AEROBIC AND ANAEROBIC Blood Culture results may not be optimal due to an excessive volume of blood received in culture bottles   Culture   Final    NO GROWTH 5 DAYS Performed at [facility], [address] [place], [place] [address]    Report Status 04/07/2022 FINAL  Final  SARS Coronavirus 2 by RT PCR (hospital order, performed in [facility] hospital lab) *cepheid single result test* Anterior Nasal Swab     Status: None   Collection Time: 04/03/22  8:45 AM   Specimen: Anterior Nasal Swab  Result Value Ref Range Status   SARS Coronavirus 2 by RT PCR NEGATIVE NEGATIVE Final    Comment: (NOTE) SARS-CoV-2 target nucleic acids are NOT DETECTED.  The SARS-CoV-2 RNA is generally detectable in upper and lower respiratory specimens during the acute phase of infection. The lowest concentration of SARS-CoV-2 viral copies this assay can detect is 250 copies / mL. A negative result does not preclude SARS-CoV-2 infection and should not be used as the sole basis for treatment or other patient management decisions.  A negative result may occur with improper specimen collection / handling, submission of specimen other than nasopharyngeal swab, presence of viral mutation(s) within the areas targeted by this assay, and inadequate number of viral copies (<250 copies / mL). A negative result must be combined with clinical observations, patient history, and epidemiological  information.  Fact Sheet for Patients:   https://www.patel.info/  Fact Sheet for Healthcare Providers: https://hall.com/  This test is not yet approved or  cleared by the [place] FDA and has been authorized for detection and/or diagnosis of SARS-CoV-2 by FDA under an Emergency Use Authorization (EUA).  This EUA will remain in effect (meaning this test can be used) for the duration of the COVID-19 declaration under Section 564(b)(1) of the Act, 21 U.S.C.  section 360bbb-3(b)(1), unless the authorization is terminated or revoked sooner.  Performed at [facility], [address] [place], [place] [address]   Body fluid culture w Gram Stain     Status: None   Collection Time: 04/03/22  3:40 PM   Specimen: PATH Cytology Pleural fluid  Result Value Ref Range Status   Specimen Description   Final    PLEURAL Performed at [facility], [address], [place], [place] [address]    Special Requests   Final    NONE Performed at [facility], [address] [place], [place] [address]    Gram Stain   Final    CYTOSPIN SMEAR WBC PRESENT, PREDOMINANTLY MONONUCLEAR NO ORGANISMS SEEN    Culture   Final    NO GROWTH 3 DAYS Performed at [facility], [place] [address] [place],  [address]    Report Status 04/07/2022 FINAL  Final         Radiology Studies: DG Chest Port 1 View  Result Date: 04/07/2022 CLINICAL DATA:  Hypoxia EXAM: PORTABLE CHEST 1 VIEW COMPARISON:  Chest x-ray dated April 03, 2022 FINDINGS: Unchanged cardiomegaly. Diffuse interstitial opacities, increased when compared with the prior. No large pleural effusion or pneumothorax. Advanced degenerative changes of the left shoulder. IMPRESSION: Increased bilateral interstitial opacities, likely due to worsening pulmonary edema. Electronically Signed   By: Yetta Glassman M.D.   On: 04/07/2022 11:37        Scheduled Meds:   apixaban  2.5 mg Oral BID   cholecalciferol  1,000 Units Oral Daily   citalopram  20 mg Oral Daily   digoxin  0.0625 mg Oral Daily   feeding supplement  1 Container Oral BID BM   gabapentin  100 mg Oral TID   ipratropium-albuterol  3 mL Nebulization BID   levothyroxine  88 mcg Oral Q0600   melatonin  5 mg Oral QHS   metoprolol tartrate  25 mg Oral BID   multivitamin with minerals  1 tablet Oral Daily   pantoprazole  40 mg Oral Daily   torsemide  40 mg Oral BID   vitamin B-12  100 mcg Oral Daily   Continuous Infusions:   LOS: 6 days     Desma Maxim, MD [facility]   If 7PM-7AM, please contact night-coverage [email or web address] Password [facility] 04/08/2022, 3:37 PM

## 2022-04-08 NOTE — Progress Notes (Addendum)
Daily Progress Note   Patient Name: [name]       Date: 04/08/2022 DOB: 12-Apr-1941  Age: 81 y.o. MRN#: [id number] Attending Physician: Gwynne Edinger, MD Primary Care Physician: Jinny Sanders, MD Admit Date: 04/02/2022  Reason for Consultation/Follow-up: Establishing goals of care  Subjective: Notes and labs reviewed.  Spoke with TOC.  Patient states she is feeling much better.  She states that she has spoken with her family since our conversation on Friday. She states if her status cannot be corrected long-term, and hospitalizations would not fix her underlying disease process, she would not really want any further hospitalizations.  She states after giving it consideration through the weekend, she has formally decided she would not want resuscitation, including CPR.  She states when she dies she wants to leave peacefully and naturally; she states she did discuss this with her family.  She confirms that she would not want to be placed on a ventilator.  Discussed different scenarios to ensure understanding of treatment plans.  Staff is working towards discharge home with hospice.  I completed a MOST form today with patient discussing every element, and the signed original was placed in the chart. The form was scanned and sent to medical records for it to be uploaded under ACP tab in Epic. A photocopy was also placed in the chart to be scanned into EMR. The patient outlined their wishes for the following treatment decisions:  Cardiopulmonary Resuscitation: Do Not Attempt Resuscitation (DNR/No CPR)  Medical Interventions: Comfort Measures: Keep clean, warm, and dry. Use medication by any route, positioning, wound care, and other measures to relieve pain and suffering. Use oxygen, suction  and manual treatment of airway obstruction as needed for comfort. Do not transfer to the hospital unless comfort needs cannot be met in current location.  Antibiotics: Determine use of limitation of antibiotics when infection occurs  IV Fluids: No IV fluids (provide other measures to ensure comfort)  Feeding Tube: No feeding tube     Length of Stay: 6  Current Medications: Scheduled Meds:   apixaban  2.5 mg Oral BID   cholecalciferol  1,000 Units Oral Daily   citalopram  20 mg Oral Daily   digoxin  0.0625 mg Oral Daily   feeding supplement  1 Container Oral BID BM   gabapentin  100 mg Oral TID   ipratropium-albuterol  3 mL Nebulization BID   levothyroxine  88 mcg Oral Q0600   melatonin  5 mg Oral QHS   metoprolol tartrate  25 mg Oral BID   multivitamin with minerals  1 tablet Oral Daily   pantoprazole  40 mg Oral Daily   torsemide  40 mg Oral BID   vitamin B-12  100 mcg Oral Daily    Continuous Infusions:   PRN Meds: acetaminophen, albuterol, morphine, oxymetazoline, polyethylene glycol  Physical Exam Pulmonary:     Effort: Pulmonary effort is normal.  Neurological:     Mental Status: She is alert.             Vital Signs: BP 103/74 (BP Location: Left Arm)   Pulse 95   Temp 97.8 F (36.6 C) (Oral)   Resp 20   Ht 5' (1.524 m)   Wt 106.1 kg   SpO2 91%   BMI 45.68 kg/m  SpO2: SpO2: 91 % O2 Device: O2 Device: High Flow Nasal Cannula O2 Flow Rate: O2 Flow Rate (L/min): 8 L/min  Intake/output summary:  Intake/Output Summary (Last 24 hours) at 04/08/2022 1134 Last data filed at 04/08/2022 1000 Gross per 24 hour  Intake 240 ml  Output 5100 ml  Net -4860 ml   LBM: Last BM Date : 04/06/22 Baseline Weight: Weight: 90.7 kg Most recent weight: Weight: 106.1 kg    Patient Active Problem List   Diagnosis Date Noted   Pulmonary hypertension ([place]) 04/02/2022   AKI (acute kidney injury) ([place]) 04/02/2022   Hyperkalemia 04/02/2022   Pleural effusion on right  04/02/2022   Demand ischemia 04/02/2022   Aneurysm of thoracic aorta ([place]) 01/12/2022   Vaginal bleeding 12/23/2021   Thrombocytopenia ([place]) 12/04/2021   Permanent atrial fibrillation ([place])    Goals of care, counseling/discussion 12/03/2021   Acute on chronic heart failure with preserved ejection fraction (HFpEF) ([place]) 12/02/2021   Chronic low back pain 12/02/2021   Obesity (BMI 30-39.9) 12/02/2021   Anxiety and depression    Osteoarthritis of left shoulder 08/30/2021   Falls 07/15/2021   Acute on chronic respiratory failure with hypoxia ([place]) 05/04/2021   Frequent UTI    Anticoagulant long-term use    Physical deconditioning 15/17/6160   Chronic systolic CHF (congestive heart failure) ([place]) 09/15/2016   Hyperlipidemia    Chronic respiratory failure with hypoxia ([place]) 12/03/2014   Multiple thyroid nodules 10/17/2012   AAA (abdominal aortic aneurysm) ([place]) 05/11/2012   Bradycardia 01/23/2012   Hypothyroid 01/21/2012   HTN (hypertension) 01/21/2012   Obstructive sleep apnea 10/16/2008   History of DVT (deep vein thrombosis) 08/19/2008   COPD (chronic obstructive pulmonary disease) ([place]) 08/19/2008   NEOPLASM, MALIGNANT, LUNG, HX OF 73/71/0626   Diastolic heart failure, NYHA class 2 ([place]) 08/03/2008   Hyperbilirubinemia 2004    Palliative Care Assessment & Plan    Recommendations/Plan:  --  States she is formally decided she would not want CPR or other resuscitative efforts, and would want to die peacefully and naturally. --  States she no longer wants to return to the hospital as this will not fix her underlying issues.  She would just want to be made comfort if she decline. -- Staff is currently working towards discharge with continued hospice care. --PMT will sign off at this time.    Code Status:    Code Status Orders  (From admission, onward)           Start  Ordered   04/08/22 1135  Do not attempt resuscitation (DNR)  Continuous       Question Answer  Comment  In the event of cardiac or respiratory ARREST Do not call a "code blue"   In the event of cardiac or respiratory ARREST Do not perform Intubation, CPR, defibrillation or ACLS   In the event of cardiac or respiratory ARREST Use medication by any route, position, wound care, and other measures to relive pain and suffering. May use oxygen, suction and manual treatment of airway obstruction as needed for comfort.   Comments MOST in chart      04/08/22 1134           Code Status History     Date Active Date Inactive Code Status Order ID Comments User Context   04/02/2022 1655 04/08/2022 1134 Partial Code [id number]  Jose Persia, MD ED   12/02/2021 1526 12/09/2021 2146 Full Code [id number]  Collier Bullock, MD ED   07/15/2021 0820 07/16/2021 2032 Full Code [id number]  Collier Bullock, MD ED   06/17/2021 2055 06/26/2021 0350 DNR [id number]  Para Skeans, MD ED   05/04/2021 2218 05/09/2021 2227 Full Code [id number]  Athena Masse, MD ED   09/15/2016 2045 09/28/2016 1707 Full Code [id number]  [place], Peter M, MD Inpatient       Prognosis:  < 6 months   Thank you for allowing the Palliative Medicine Team to assist in the care of this patient.   Asencion Gowda, NP  Please contact Palliative Medicine Team phone at [phone] for questions and concerns.

## 2022-04-09 DIAGNOSIS — J9622 Acute and chronic respiratory failure with hypercapnia: Secondary | ICD-10-CM

## 2022-04-09 DIAGNOSIS — I272 Pulmonary hypertension, unspecified: Secondary | ICD-10-CM

## 2022-04-09 DIAGNOSIS — R627 Adult failure to thrive: Secondary | ICD-10-CM

## 2022-04-09 DIAGNOSIS — I5033 Acute on chronic diastolic (congestive) heart failure: Secondary | ICD-10-CM

## 2022-04-09 DIAGNOSIS — J449 Chronic obstructive pulmonary disease, unspecified: Secondary | ICD-10-CM

## 2022-04-09 DIAGNOSIS — J9621 Acute and chronic respiratory failure with hypoxia: Secondary | ICD-10-CM | POA: Diagnosis not present

## 2022-04-09 DIAGNOSIS — I482 Chronic atrial fibrillation, unspecified: Secondary | ICD-10-CM

## 2022-04-09 LAB — BLOOD GAS, VENOUS
Acid-Base Excess: 11.5 mmol/L — ABNORMAL HIGH (ref 0.0–2.0)
Bicarbonate: 39 mmol/L — ABNORMAL HIGH (ref 20.0–28.0)
O2 Saturation: 99.1 %
Patient temperature: 37
pCO2, Ven: 63 mmHg — ABNORMAL HIGH (ref 44–60)
pH, Ven: 7.4 (ref 7.25–7.43)
pO2, Ven: 164 mmHg — ABNORMAL HIGH (ref 32–45)

## 2022-04-09 LAB — BASIC METABOLIC PANEL
Anion gap: 6 (ref 5–15)
BUN: 22 mg/dL (ref 8–23)
CO2: 41 mmol/L — ABNORMAL HIGH (ref 22–32)
Calcium: 8.6 mg/dL — ABNORMAL LOW (ref 8.9–10.3)
Chloride: 97 mmol/L — ABNORMAL LOW (ref 98–111)
Creatinine, Ser: 0.93 mg/dL (ref 0.44–1.00)
GFR, Estimated: >60 mL/min (ref >60)
Glucose, Bld: 117 mg/dL — ABNORMAL HIGH (ref 70–99)
Potassium: 3.3 mmol/L — ABNORMAL LOW (ref 3.5–5.1)
Sodium: 144 mmol/L (ref 135–145)

## 2022-04-09 MED ORDER — POTASSIUM CHLORIDE CRYS ER 20 MEQ PO TBCR
40.0000 meq | EXTENDED_RELEASE_TABLET | ORAL | Status: AC
  Administered 2022-04-09 (×2): 40 meq via ORAL
  Filled 2022-04-09 (×2): qty 2

## 2022-04-09 MED ORDER — SPIRONOLACTONE 25 MG PO TABS
25.0000 mg | ORAL_TABLET | Freq: Every day | ORAL | Status: DC
  Administered 2022-04-09 – 2022-04-17 (×9): 25 mg via ORAL
  Filled 2022-04-09 (×10): qty 1

## 2022-04-09 MED ORDER — FUROSEMIDE 10 MG/ML IJ SOLN
40.0000 mg | Freq: Two times a day (BID) | INTRAMUSCULAR | Status: DC
  Administered 2022-04-09 – 2022-04-13 (×8): 40 mg via INTRAVENOUS
  Filled 2022-04-09 (×8): qty 4

## 2022-04-09 NOTE — Progress Notes (Signed)
[facility]) Hospital Note  [name] is a current [facility] patient with a terminal diagnosis of hypertensive heart disease w/ heart failure.  Patient experienced worsening respiratory distress at her facility, [place], [facility] RN evaluated and decided to have her evaluated in the ED.  [name] was admitted to [facility] 10/11 for acute on chronic respiratory failure.  Per ACC MD, Dr. Cherie Ouch, this is a related hospice admission.  Visited [name] at the bedside this morning, she had just finished about 75% of meal, requesting a hot cup of coffee.  She states she is not in pain and "my breathing has been good today." Discussion was had about transferring care to [facility] inpatient unit for management of symptoms while continuing to search for LTC/SNF.  [name] was resistant to the idea stating "I don't want end of life yet." ACC services at inpatient unit were explained.  No questions at this time.  ACC will continue and support [name].  Patient is inpatient appropriate while managing shortness of breath and oxygen dependency necessitating higher level of monitoring.    VS: 97.4 oral  107/71 HR 58 RR 21 86% HFNC 6 L  I&O: Cumulative -2445 (no input documented)   Abnormal Labs:  K+ 3.3 CO2 41  Diagnostics:  No new imaging orders 10/16 CXR: increased bilateral interstitial opacities, likely due to worsening pulmonary edema.  IV/PRN Medications: N/A   Problems List:  - Acute on Chronic Respiratory Failure/ COPD/ Pleural Effusion: baseline O2 requirement is 4L/M, now requiring high flow nasal cannula at 6L/M.  CTchest Neg for PE.  S/P thoracentesis yeilding 552mL. Recent pulmonary note stated increasing basline O2 to 6L/M.  Continue steroids, duonebs, O2.   - AKI: resolved. - A Fib: continue home meds.  Goals of Care: [name] wishes to return to LTC once medically cleared and location obtained (hospital [facility] working on disposition, [place] will continue to follow.)    Anticipated discharge to LTC w/ [facility] hospice following.    Report exchanged with TOC, Sarah.    Thank you for the opportunity to participate in this patient's care.    Kenna Gilbert BSN, RN  [facility] Liaison  [phone] Epic/Amion

## 2022-04-09 NOTE — Progress Notes (Signed)
PT Cancellation Note  Patient Details Name: [name] MRN: [id number] DOB: 03/25/1941   Cancelled Treatment:     PT attempt. Pt visibly fatigued and slightly SOB. Still requiring HFNC.  Desaturates with just talking to author. She politely requested to rest and ask author to return at a later time/date. PT will continue to follow and progress as able per current POC.     Willette Pa 04/09/2022, 12:27 PM

## 2022-04-09 NOTE — Progress Notes (Signed)
  Progress Note   Patient: [name] UMP:[id number] DOB: 1940/12/14 DOA: 04/02/2022     7 DOS: the patient was seen and examined on 04/09/2022   Brief hospital course: Pedro Oldenburg is a 81 y.o. female with medical history significant of severe COPD c/b chronic hypoxic and hypercapnic respiratory failure on 4 L [place] O2, severe pulmonary hypertension, systolic CHF, atrial fibrillation on Eliquis, peripheral artery disease, NSCLC s/p XRT/chemotherapy in remission, OSA not on CPAP, who presents to the ED with shortness of breath. She eventually required 8 L oxygen to maintain her oxygen saturation. She is treated with diuretics, also received steroids.  Assessment and Plan: Acute on chronic respiratory failure with hypoxemia and hypercapnia secondary to acute congestive heart failure, COPD is aspiration and severe pulm hypertension. Acute on chronic diastolic congestive heart failure. Right pleural effusion secondary to congestive heart failure. Recent echocardiogram performed in 11/2021 showed ejection fraction 60 to 65% with severe pulm hypertension. Patient still has significant short of breath, secondary crackle in the bases, still has significant volume overload.  We will give IV Lasix twice a day.  Severe COPD Patient does not seem to have any significant bronchospasm.  Severe pulm hypertension. Permanent atrial fibrillation with RVR. Heart rate is better controlled.  Acute kidney injury. Hypokalemia. Renal function has improved.  Patient will receive 80 mEq oral potassium today.  Failure to thrive. Severe debility. Followed by hospice, most likely will discharge to assisted living with hospice care vs SNF with hospice.         Subjective:  Patient still has secondary short of breath with mid exertion.  Cough, nonproductive   Physical Exam: Vitals:   04/09/22 0810 04/09/22 0813 04/09/22 0959 04/09/22 1215  BP:    105/71  Pulse: (!) 107 74 73 83  Resp: (!) 23  (!) 21 (!) 22 (!) 23  Temp:    97.6 F (36.4 C)  TempSrc:      SpO2: (!) 77% (!) 89% 93% 93%  Weight:      Height:       General exam: Appears calm and comfortable  Respiratory system: Crackles in the base. Respiratory effort normal. Cardiovascular system: Irregular. No JVD, murmurs, rubs, gallops or clicks.  Gastrointestinal system: Abdomen is nondistended, soft and nontender. No organomegaly or masses felt. Normal bowel sounds heard. Central nervous system: Alert and oriented X2. No focal neurological deficits. Extremities: 2+ LEG EDEMA Skin: No rashes, lesions or ulcers Psychiatry: Mood & affect appropriate.   Data Reviewed:  Chest x-ray and lab results.  Family Communication: daughter updated  Disposition: Status is: Inpatient Remains inpatient appropriate because: daughter updated  Planned Discharge Destination:  assisted living facility  with hospice    Time spent: 55 minutes  Author: Sharen Hones, MD 04/09/2022 3:55 PM  For on call review [email or web address].

## 2022-04-09 NOTE — Hospital Course (Addendum)
[name] is a 81 y.o. female with medical history significant of severe COPD c/b chronic hypoxic and hypercapnic respiratory failure on 4 L [place] O2, severe pulmonary hypertension, systolic CHF, atrial fibrillation on Eliquis, peripheral artery disease, NSCLC s/p XRT/chemotherapy in remission, OSA not on CPAP, who presents to the ED with shortness of breath. She eventually required 8 L oxygen to maintain her oxygen saturation.  Required to go on heated high flow nasal cannula. She is treated with diuretics for CHF exacerbation. She also had a right-sided thoracentesis, removing 500 mL of fluids on 10/12.  Now on oral diuretics.  10/26.  Change the goal of oxygenation for pulse ox of 88% or above.  We are able to titrate down on the 50% heated high flow nasal cannula at 50 L flow down to 40% FiO2 on 20 L flow.  Continue to taper oxygen.  10/27.  Case discussed with hospice liaison.  They can do high flow nasal cannula 15 L at hospice facility can potentially go up to 23 L.  This morning patient was on high flow nasal cannula 35% FiO2 and 10 L flow.  Patient will transport to hospice home on Ventimask and then have high flow nasal cannula at hospice home.

## 2022-04-09 NOTE — NC FL2 (Signed)
[place] LEVEL OF CARE SCREENING TOOL     IDENTIFICATION  Patient Name: [name] Birthdate: Nov 04, 1940 Sex: female Admission Date (Current Location): 04/02/2022  [facility] and [place] Number:  Engineering geologist and Address:  [facility], [address] [place], [place] [address]      Provider Number: [id number]  Attending Physician Name and Address:  Sharen Hones, MD  Relative Name and Phone Number:  Nydia, Ytuarte (Daughter)   [phone] [facility])    Current Level of Care: Hospital Recommended Level of Care: [place] (with hospice) Prior Approval Number: [id number]  Date Approved/Denied: 06/21/21 PASRR Number: [id number]  Discharge Plan: SNF (with hospice)    Current Diagnoses: Patient Active Problem List   Diagnosis Date Noted   Pulmonary hypertension ([place]) 04/02/2022   AKI (acute kidney injury) ([place]) 04/02/2022   Hyperkalemia 04/02/2022   Pleural effusion on right 04/02/2022   Demand ischemia 04/02/2022   Aneurysm of thoracic aorta ([place]) 01/12/2022   Vaginal bleeding 12/23/2021   Thrombocytopenia ([place]) 12/04/2021   Permanent atrial fibrillation ([place])    Goals of care, counseling/discussion 12/03/2021   Acute on chronic heart failure with preserved ejection fraction (HFpEF) ([place]) 12/02/2021   Chronic low back pain 12/02/2021   Obesity (BMI 30-39.9) 12/02/2021   Anxiety and depression    Osteoarthritis of left shoulder 08/30/2021   Falls 07/15/2021   Acute on chronic respiratory failure with hypoxia ([place]) 05/04/2021   Frequent UTI    Anticoagulant long-term use    Physical deconditioning 41/96/2229   Chronic systolic CHF (congestive heart failure) ([place]) 09/15/2016   Hyperlipidemia    Chronic respiratory failure with hypoxia ([place]) 12/03/2014   Multiple thyroid nodules 10/17/2012   AAA (abdominal aortic aneurysm) ([place]) 05/11/2012   Bradycardia 01/23/2012   Hypothyroid 01/21/2012   HTN  (hypertension) 01/21/2012   Obstructive sleep apnea 10/16/2008   History of DVT (deep vein thrombosis) 08/19/2008   COPD (chronic obstructive pulmonary disease) ([place]) 08/19/2008   NEOPLASM, MALIGNANT, LUNG, HX OF 79/89/2119   Diastolic heart failure, NYHA class 2 ([place]) 08/03/2008   Hyperbilirubinemia 2004    Orientation RESPIRATION BLADDER Height & Weight     Self, Situation, Place, Time  O2 (HFNC 6 L. Weaning as tolerated. Not on CPAP prior to admission. Looks like she has only used once this admission.) Incontinent, External catheter Weight: 233 lb 14.4 oz (106.1 kg) Height:  5' (152.4 cm)  BEHAVIORAL SYMPTOMS/MOOD NEUROLOGICAL BOWEL NUTRITION STATUS   (None)  (None) Continent Diet (Regular)  AMBULATORY STATUS COMMUNICATION OF NEEDS Skin   Supervision Verbally Bruising, Other (Comment) (Erythema/redness.)                       Personal Care Assistance Level of Assistance  Bathing, Dressing, Feeding Bathing Assistance: Limited assistance Feeding assistance: Limited assistance Dressing Assistance: Limited assistance     Functional Limitations Info  Sight, Hearing, Speech Sight Info: Adequate Hearing Info: Adequate Speech Info: Adequate    SPECIAL CARE FACTORS FREQUENCY                 Contractures Contractures Info: Not present    Additional Factors Info  Code Status, Allergies, Isolation Precautions Code Status Info: DNR Allergies Info: NKDA     Isolation Precautions Info: Contact: ESBL     Current Medications (04/09/2022):  This is the current hospital active medication list Current Facility-Administered Medications  Medication Dose Route Frequency Provider Last Rate Last Admin   acetaminophen (TYLENOL)  tablet 1,000 mg  1,000 mg Oral Q6H PRN Wouk, Ailene Rud, MD       albuterol (PROVENTIL) (2.5 MG/3ML) 0.083% nebulizer solution 3 mL  3 mL Inhalation Q6H PRN Wouk, Ailene Rud, MD       apixaban Arne [place]) tablet 2.5 mg  2.5 mg Oral BID Jose Persia, MD   2.5 mg at 04/09/22 6606   cholecalciferol (VITAMIN D3) 25 MCG (1000 UNIT) tablet 1,000 Units  1,000 Units Oral Daily Gwynne Edinger, MD   1,000 Units at 04/09/22 0954   citalopram (CELEXA) tablet 20 mg  20 mg Oral Daily Jose Persia, MD   20 mg at 04/09/22 0950   digoxin (LANOXIN) tablet 0.0625 mg  0.0625 mg Oral Daily Jose Persia, MD   0.0625 mg at 04/09/22 3016   feeding supplement (BOOST / RESOURCE BREEZE) liquid 1 Container  1 Container Oral BID BM Wouk, Ailene Rud, MD   1 Container at 04/09/22 1051   gabapentin (NEURONTIN) capsule 100 mg  100 mg Oral TID Jose Persia, MD   100 mg at 04/09/22 0950   ipratropium-albuterol (DUONEB) 0.5-2.5 (3) MG/3ML nebulizer solution 3 mL  3 mL Nebulization BID Gwynne Edinger, MD   3 mL at 04/09/22 0732   levothyroxine (SYNTHROID) tablet 88 mcg  88 mcg Oral Q0600 Jose Persia, MD   88 mcg at 04/09/22 0507   melatonin tablet 5 mg  5 mg Oral QHS Wouk, Ailene Rud, MD   5 mg at 04/08/22 2217   metoprolol tartrate (LOPRESSOR) tablet 25 mg  25 mg Oral BID Gwynne Edinger, MD   25 mg at 04/09/22 0950   morphine (MSIR) tablet 7.5 mg  7.5 mg Oral Q6H PRN Jose Persia, MD       multivitamin with minerals tablet 1 tablet  1 tablet Oral Daily Gwynne Edinger, MD   1 tablet at 04/09/22 0950   oxymetazoline (AFRIN) 0.05 % nasal spray 1 spray  1 spray Each Nare BID PRN Wouk, Ailene Rud, MD       pantoprazole (PROTONIX) EC tablet 40 mg  40 mg Oral Daily Jose Persia, MD   40 mg at 04/09/22 0949   polyethylene glycol (MIRALAX / GLYCOLAX) packet 17 g  17 g Oral Daily PRN Jose Persia, MD       torsemide (DEMADEX) tablet 40 mg  40 mg Oral BID Gwynne Edinger, MD   40 mg at 04/09/22 0109   vitamin B-12 (CYANOCOBALAMIN) tablet 100 mcg  100 mcg Oral Daily Gwynne Edinger, MD   100 mcg at 04/09/22 3235     Discharge Medications: Please see discharge summary for a list of discharge medications.  Relevant Imaging  Results:  Relevant Lab Results:   Additional Information SS#: [id number]. Admitted from [place]. 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  Candie Chroman, LCSW

## 2022-04-10 DIAGNOSIS — I5033 Acute on chronic diastolic (congestive) heart failure: Secondary | ICD-10-CM | POA: Diagnosis not present

## 2022-04-10 DIAGNOSIS — J90 Pleural effusion, not elsewhere classified: Secondary | ICD-10-CM | POA: Diagnosis not present

## 2022-04-10 DIAGNOSIS — I272 Pulmonary hypertension, unspecified: Secondary | ICD-10-CM | POA: Diagnosis not present

## 2022-04-10 DIAGNOSIS — J9621 Acute and chronic respiratory failure with hypoxia: Secondary | ICD-10-CM | POA: Diagnosis not present

## 2022-04-10 LAB — MAGNESIUM: Magnesium: 2 mg/dL (ref 1.7–2.4)

## 2022-04-10 LAB — BASIC METABOLIC PANEL
Anion gap: 5 (ref 5–15)
BUN: 25 mg/dL — ABNORMAL HIGH (ref 8–23)
CO2: 40 mmol/L — ABNORMAL HIGH (ref 22–32)
Calcium: 8.3 mg/dL — ABNORMAL LOW (ref 8.9–10.3)
Chloride: 95 mmol/L — ABNORMAL LOW (ref 98–111)
Creatinine, Ser: 0.99 mg/dL (ref 0.44–1.00)
GFR, Estimated: 57 mL/min — ABNORMAL LOW (ref >60)
Glucose, Bld: 98 mg/dL (ref 70–99)
Potassium: 3.9 mmol/L (ref 3.5–5.1)
Sodium: 140 mmol/L (ref 135–145)

## 2022-04-10 NOTE — Progress Notes (Signed)
  Progress Note   Patient: [name] HMC:[id number] DOB: [birth year] DOA: 04/02/2022     8 DOS: the patient was seen and examined on 04/10/2022   Brief hospital course: [name] is a 81 y.o. female with medical history significant of severe COPD c/b chronic hypoxic and hypercapnic respiratory failure on 4 L [place] O2, severe pulmonary hypertension, systolic CHF, atrial fibrillation on Eliquis, peripheral artery disease, NSCLC s/p XRT/chemotherapy in remission, OSA not on CPAP, who presents to the ED with shortness of breath. She eventually required 8 L oxygen to maintain her oxygen saturation. She is treated with diuretics for CHF exacerbation. She also had a right-sided thoracentesis, removing 500 mL of fluids on 10/12. She is continued on IV Lasix.   Assessment and Plan: Acute on chronic respiratory failure with hypoxemia and hypercapnia secondary to acute congestive heart failure, COPD is aspiration and severe pulm hypertension. Acute on chronic diastolic congestive heart failure. Right pleural effusion secondary to congestive heart failure. Recent echocardiogram performed in 11/2021 showed ejection fraction 60 to 65% with severe pulm hypertension. Lung sounds better today, will continue 1 more day of IV Lasix.  Monitor renal function and electrolytes. Patient is status post right side thoracentesis.   Severe COPD Appears stable, no bronchospasm.   Severe pulm hypertension. Permanent atrial fibrillation with RVR. Heart rate is better controlled.   Acute kidney injury. Hypokalemia. Renal function improved, potassium normalized.   Failure to thrive. Severe debility. Followed by hospice.  Assisted-living facility would not accept patient back, looking for long-term care with hospice care.        Subjective: Patient still has short of breath with version, currently on 5 L oxygen.  Physical Exam: Vitals:   04/10/22 0800 04/10/22 0828 04/10/22 1000 04/10/22 1214  BP:   98/66  (!) 92/59  Pulse: (!) 33 98  93  Resp: 20 (!) 21  (!) 25  Temp:  98.6 F (37 C)  98.6 F (37 C)  TempSrc:      SpO2: 96% 97% 91% (!) 88%  Weight:      Height:       General exam: Appears calm and comfortable  Respiratory system: Decreased breath sounds. Respiratory effort normal. Cardiovascular system: S1 & S2 heard, RRR. No JVD, murmurs, rubs, gallops or clicks. No pedal edema. Gastrointestinal system: Abdomen is nondistended, soft and nontender. No organomegaly or masses felt. Normal bowel sounds heard. Central nervous system: Alert and oriented. No focal neurological deficits. Extremities: Symmetric 5 x 5 power. Skin: No rashes, lesions or ulcers Psychiatry:  Mood & affect appropriate.   Data Reviewed:  Lab results reviewed.  Family Communication: Family and daughter updated.  Disposition: Status is: Inpatient Remains inpatient appropriate because: Severity of disease, IV treatment.  Planned Discharge Destination:  Long-term care with hospice.    Time spent: 35 minutes  Author: Sharen Hones, MD 04/10/2022 1:48 PM  For on call review [email or web address].

## 2022-04-10 NOTE — Progress Notes (Signed)
OT Cancellation Note  Patient Details Name: [name] MRN: [id number] DOB: March 02, 1941   Cancelled Treatment:    Reason Eval/Treat Not Completed: Other (comment). Pt with nursing staff for care/assessment. Will re-attempt OT tx at later date/time as pt is available.   Ardeth Perfect., MPH, MS, OTR/L ascom [phone] 04/10/22, 2:32 PM

## 2022-04-10 NOTE — Consult Note (Signed)
   [facility] Inpatient Consult   04/10/2022  [name] 05-Jul-1940 [id number]   [place] [facility] [ACO] Patient: Medicare [facility] Liaison remote coverage for [facility] for review and for community care coordination needs at transition.    Primary Care Provider: Jinny Sanders, MD at [facility], is a provider listed for the   [facility] follow up needs    Patient screened for length of stay hospitalization with noted  high risk score for unplanned readmission risk.  Reviewed to assess for potential [place] [facility] service needs for post hospital transition for readmission prevention needs.  Review of patient's electronic medical record reveals patient is being followed by palliative care for post hospital needs as well as inpatient [facility] team for skilled nursing facilit verses palliative care/hospice.   Plan:  Continue to follow progress and disposition to assess for post hospital care management needs.  Referral request needs for community care coordination will depend on deposition.    For questions contact:    Natividad Brood, RN BSN [facility] Liaison  [phone] business mobile phone Toll free office [phone]  Fax number: [phone] [email or web address]

## 2022-04-10 NOTE — Progress Notes (Signed)
[facility]) Hospital Note  [name] is a current [facility] patient with a terminal diagnosis of hypertensive heart disease w/ heart failure.  Patient experienced worsening respiratory distress at her facility, [place], [facility] RN evaluated and decided to have her evaluated in the ED.  [name] was admitted to [facility] 10/11 for acute on chronic respiratory failure.  Per [facility] MD, Dr. Cherie Ouch, this is a related hospice admission.  Visited [name] at the bedside.  Patient stated she was feeling "not good."  We further discussed care continuing at [facility].  Patient seemed more accepting of proceeding with care there, than she did yesterday.    Patient is inpatient appropriate while managing shortness of breath and oxygen dependency necessitating higher level of monitoring.  Patient is also receiving IV Lasix for fluid volume overload.   VS: 98.6 oral  98/66 HR98 RR21 97% 6L HFNC  I&O: Cumulative -2850(no input documented)   Abnormal Labs:  K+ 3.3 >> 3.9 CO2 40 BUN 25 Cr 0.99 GFR 57  Diagnostics:  No new imaging orders 10/16 CXR: increased bilateral interstitial opacities, likely due to worsening pulmonary edema.  IV/PRN Medications:  Furosemide 40mg  IVP BID  Morphine tablet 7.5mg  PO PRN q6h  Problems List:  - Acute on Chronic Respiratory Failure/ COPD/ Pleural Effusion: baseline O2 requirement is 4L/M, now requiring high flow nasal cannula at 6L/M.  CTchest Neg for PE.  S/P thoracentesis yeilding 537mL. Recent pulmonary note stated increasing basline O2 to 6L/M.  Continue steroids, duonebs, O2.  Furosemide/Lasix BID via IVP.   - AKI: resolved. - A Fib: continue home meds.  Goals of Care: [name] wishes to return to LTC once medically cleared and location obtained (hospital [facility] working on disposition, [facility] will continue to follow.)   Anticipated discharge to LTC w/ [facility] hospice following.    Report exchanged with TOC, Sarah.    Thank you for the  opportunity to participate in this patient's care.    Kenna Gilbert BSN, RN  [facility] Liaison  [phone] Epic/Amion

## 2022-04-10 NOTE — Progress Notes (Signed)
Physical Therapy Treatment Patient Details Name: [name] MRN: [id number] DOB: 06-Aug-1940 Today's Date: 04/10/2022   History of Present Illness [name] is a 81 y.o. female with medical history significant of severe COPD c/b chronic hypoxic and hypercapnic respiratory failure on 4 L [place] O2, severe pulmonary hypertension, systolic CHF, atrial fibrillation on Eliquis, peripheral artery disease, NSCLC s/p XRT/chemotherapy in remission, OSA not on CPAP, who presents to the ED with shortness of breath.    PT Comments    Pt was long sitting in bed upon arriving. She is A and O x 4 and agreeable to PT session. HR in A fib with pt on 5 L HFNC. She was able to achieve EOB short sit with min assist however quickly desaturates to mid-low 80s. Required increase O2 supply to 10 L to be able to maintain 88%. Does not endorse dizziness or sensation of lightheadedness but has slight SOB noted. RR into mid 104s but with relaxation and breathing techniques resolved to  upper 20s. She tolerated several exercises while seated EOB prior to returning to supine. With prolonged recovery time was able to wean back to 5L HFNC while long sitting in bed. Pt is planning to DC to LTC facility. Recommend continued PT going forward to maximize activity tolerance while decreasing caregiver burden.     Recommendations for follow up therapy are one component of a multi-disciplinary discharge planning process, led by the attending physician.  Recommendations may be updated based on patient status, additional functional criteria and insurance authorization.  Follow Up Recommendations  Home health PT     Assistance Recommended at Discharge Frequent or constant Supervision/Assistance  Patient can return home with the following A little help with walking and/or transfers;A little help with bathing/dressing/bathroom;Assistance with cooking/housework;Assist for transportation;Help with stairs or ramp for entrance    Equipment Recommendations  Rolling walker (2 wheels)       Precautions / Restrictions Precautions Precautions: Fall Restrictions Weight Bearing Restrictions: No     Mobility  Bed Mobility Overal bed mobility: Needs Assistance Bed Mobility: Supine to Sit, Sit to Supine  Supine to sit: Min assist, Mod assist Sit to supine: Mod assist   General bed mobility comments: pt was able to progress to EOB short sit with increased time + min-mod assist. Pt was on 5 L HFNC however required increase to 10L HFNC to maintain > 88%. she sat EOB x ~ 5 minutes performing several exercises. Pt becomes SOB and needs to return to supine (HOB elevated).     Balance Overall balance assessment: Needs assistance Sitting-balance support: Feet unsupported, Bilateral upper extremity supported Sitting balance-Leahy Scale: Fair       Cognition Arousal/Alertness: Awake/alert Behavior During Therapy: WFL for tasks assessed/performed Overall Cognitive Status: Within Functional Limits for tasks assessed      General Comments: Pt is A and O x4. Pleasant and cooperative           General Comments General comments (skin integrity, edema, etc.): pt performed several exercises whiled seated EOB. Has posterior LOB when performing them. mIn assist to prevent falling back into bed while performing LAQ.      Pertinent Vitals/Pain Pain Assessment Pain Assessment: No/denies pain     PT Goals (current goals can now be found in the care plan section) Acute Rehab PT Goals Patient Stated Goal: feel better Progress towards PT goals: Progressing toward goals    Frequency    Min 2X/week      PT Plan Current plan  remains appropriate       AM-PAC PT "6 Clicks" Mobility   Outcome Measure  Help needed turning from your back to your side while in a flat bed without using bedrails?: A Little Help needed moving from lying on your back to sitting on the side of a flat bed without using bedrails?: A  Little Help needed moving to and from a bed to a chair (including a wheelchair)?: A Little Help needed standing up from a chair using your arms (e.g., wheelchair or bedside chair)?: A Little Help needed to walk in hospital room?: A Lot Help needed climbing 3-5 steps with a railing? : A Lot 6 Click Score: 16    End of Session Equipment Utilized During Treatment: Gait belt;Oxygen Activity Tolerance: Patient tolerated treatment well Patient left: in bed;with call bell/phone within reach Nurse Communication: Mobility status PT Visit Diagnosis: Other abnormalities of gait and mobility (R26.89);Muscle weakness (generalized) (M62.81)     Time: 0312-8118 PT Time Calculation (min) (ACUTE ONLY): 11 min  Charges:  $Therapeutic Activity: 8-22 mins                     Julaine Fusi PTA 04/10/22, 4:40 PM

## 2022-04-11 DIAGNOSIS — J90 Pleural effusion, not elsewhere classified: Secondary | ICD-10-CM | POA: Diagnosis not present

## 2022-04-11 DIAGNOSIS — J9621 Acute and chronic respiratory failure with hypoxia: Secondary | ICD-10-CM | POA: Diagnosis not present

## 2022-04-11 DIAGNOSIS — J9622 Acute and chronic respiratory failure with hypercapnia: Secondary | ICD-10-CM | POA: Diagnosis not present

## 2022-04-11 DIAGNOSIS — I272 Pulmonary hypertension, unspecified: Secondary | ICD-10-CM | POA: Diagnosis not present

## 2022-04-11 LAB — BASIC METABOLIC PANEL
Anion gap: 11 (ref 5–15)
BUN: 29 mg/dL — ABNORMAL HIGH (ref 8–23)
CO2: 38 mmol/L — ABNORMAL HIGH (ref 22–32)
Calcium: 8.6 mg/dL — ABNORMAL LOW (ref 8.9–10.3)
Chloride: 89 mmol/L — ABNORMAL LOW (ref 98–111)
Creatinine, Ser: 0.82 mg/dL (ref 0.44–1.00)
GFR, Estimated: >60 mL/min (ref >60)
Glucose, Bld: 113 mg/dL — ABNORMAL HIGH (ref 70–99)
Potassium: 3.4 mmol/L — ABNORMAL LOW (ref 3.5–5.1)
Sodium: 138 mmol/L (ref 135–145)

## 2022-04-11 LAB — CBC
HCT: 50.1 % — ABNORMAL HIGH (ref 36.0–46.0)
Hemoglobin: 15.6 g/dL — ABNORMAL HIGH (ref 12.0–15.0)
MCH: 34.3 pg — ABNORMAL HIGH (ref 26.0–34.0)
MCHC: 31.1 g/dL (ref 30.0–36.0)
MCV: 110.1 fL — ABNORMAL HIGH (ref 80.0–100.0)
Platelets: 95 10*3/uL — ABNORMAL LOW (ref 150–400)
RBC: 4.55 MIL/uL (ref 3.87–5.11)
RDW: 14.2 % (ref 11.5–15.5)
WBC: 4.9 10*3/uL (ref 4.0–10.5)
nRBC: 0 % (ref 0.0–0.2)

## 2022-04-11 LAB — MAGNESIUM: Magnesium: 2 mg/dL (ref 1.7–2.4)

## 2022-04-11 MED ORDER — POTASSIUM CHLORIDE CRYS ER 20 MEQ PO TBCR
40.0000 meq | EXTENDED_RELEASE_TABLET | ORAL | Status: AC
  Administered 2022-04-11 (×2): 40 meq via ORAL
  Filled 2022-04-11 (×2): qty 2

## 2022-04-11 NOTE — Progress Notes (Signed)
Physical Therapy Treatment Patient Details Name: [name] MRN: [id number] DOB: [birth year] Today's Date: 04/11/2022   History of Present Illness [name] is a 81 y.o. female with medical history significant of severe COPD c/b chronic hypoxic and hypercapnic respiratory failure on 4 L [place] O2, severe pulmonary hypertension, systolic CHF, atrial fibrillation on Eliquis, peripheral artery disease, NSCLC s/p XRT/chemotherapy in remission, OSA not on CPAP, who presents to the ED with shortness of breath.    PT Comments    Pt seen for PT tx with NT exiting room reporting she just assisted pt back to bed. Pt declines OOB mobility but agreeable to bed level exercises. Pt requires cuing for technique & demonstrates LLE weakness more so than RLE. Pt on 5L/min via nasal cannula with SPO2 >/= 90% throughout; HR in a-fib. Pt left in bed with all needs at hand.     Recommendations for follow up therapy are one component of a multi-disciplinary discharge planning process, led by the attending physician.  Recommendations may be updated based on patient status, additional functional criteria and insurance authorization.  Follow Up Recommendations  Home health PT     Assistance Recommended at Discharge Frequent or constant Supervision/Assistance  Patient can return home with the following A little help with walking and/or transfers;A little help with bathing/dressing/bathroom;Assistance with cooking/housework;Assist for transportation;Help with stairs or ramp for entrance   Equipment Recommendations  Rolling walker (2 wheels)    Recommendations for Other Services       Precautions / Restrictions Precautions Precautions: Fall Restrictions Weight Bearing Restrictions: No     Mobility  Bed Mobility                    Transfers                        Ambulation/Gait                   Stairs             Wheelchair Mobility    Modified Rankin  (Stroke Patients Only)       Balance                                            Cognition Arousal/Alertness: Awake/alert Behavior During Therapy: WFL for tasks assessed/performed Overall Cognitive Status: Within Functional Limits for tasks assessed                                          Exercises General Exercises - Lower Extremity Gluteal Sets: AROM, Strengthening, 10 reps, Supine Heel Slides: AROM, Strengthening, Both, 10 reps, Supine Hip ABduction/ADduction: AROM, Strengthening, Both, 10 reps, Supine (hip abduction slides x 10) Straight Leg Raises: AAROM, Supine, Strengthening, Both, 10 reps (LLE weaker than RLE)    General Comments General comments (skin integrity, edema, etc.): SpO2 >95%, 6L O2, denied SOB      Pertinent Vitals/Pain Pain Assessment Pain Assessment: No/denies pain    Home Living                          Prior Function            PT Goals (current goals can now  be found in the care plan section) Acute Rehab PT Goals Patient Stated Goal: feel better PT Goal Formulation: With patient Time For Goal Achievement: 04/17/22 Potential to Achieve Goals: Good Progress towards PT goals: Progressing toward goals    Frequency    Min 2X/week      PT Plan Current plan remains appropriate    Co-evaluation              AM-PAC PT "6 Clicks" Mobility   Outcome Measure  Help needed turning from your back to your side while in a flat bed without using bedrails?: A Little Help needed moving from lying on your back to sitting on the side of a flat bed without using bedrails?: A Little Help needed moving to and from a bed to a chair (including a wheelchair)?: A Little Help needed standing up from a chair using your arms (e.g., wheelchair or bedside chair)?: A Little Help needed to walk in hospital room?: A Lot Help needed climbing 3-5 steps with a railing? : A Lot 6 Click Score: 16    End of Session  Equipment Utilized During Treatment: Oxygen Activity Tolerance: Patient tolerated treatment well Patient left: in bed;with call bell/phone within reach;with bed alarm set   PT Visit Diagnosis: Other abnormalities of gait and mobility (R26.89);Muscle weakness (generalized) (M62.81)     Time: 1550-1600 PT Time Calculation (min) (ACUTE ONLY): 10 min  Charges:  $Therapeutic Exercise: 8-22 mins                     Lavone Nian, PT, DPT 04/11/22, 4:04 PM   [name] 04/11/2022, 4:02 PM

## 2022-04-11 NOTE — Progress Notes (Signed)
[facility]) Hospital Note  Ms. Strohman is a current [facility] patient with a terminal diagnosis of hypertensive heart disease w/ heart failure.  Patient experienced worsening respiratory distress at her facility, [place], [facility] RN evaluated and decided to have her evaluated in the ED.  Ms. Occhipinti was admitted to [facility] 10/11 for acute on chronic respiratory failure.  Per [facility] MD, Dr. Cherie Ouch, this is a related hospice admission.  Visited Ms. Kilgore at the bedside.  Patient stated she was feeling "better than when I came in." During converstation patient was visibly short of breath.  Ms. Harriss had just finished her breakfast, 100% of meal.  Ms. Everson spoke to [place] that she is speaking with her children to determine what LTC facility she will move to at discharge.    Patient is inpatient appropriate while managing shortness of breath and oxygen dependency necessitating higher level of monitoring.  Patient is also receiving IV Lasix for fluid volume overload.   VS: 97.5 oral 107/83 HR 99 RR 22 92% 5L HFNC  I&O: Cumulative -950 (no input documented)   Abnormal Labs:  K+ 3.0 >> 3.4 CO2 38 BUN 29 Cr 0.82 GFR >60  Diagnostics:  No new imaging orders 10/16 CXR: increased bilateral interstitial opacities, likely due to worsening pulmonary edema.  IV/PRN Medications:  Furosemide 40mg  IVP BID  Polyethylene glycol 17g PRN daily   Problems List:  - Acute on Chronic Respiratory Failure/ COPD/ Pleural Effusion: baseline O2 requirement is 4L/M, now requiring high flow nasal cannula at 6L/M.  CTchest Neg for PE.  S/P thoracentesis yeilding 560mL. Recent pulmonary note stated increasing basline O2 to 6L/M.  Continue steroids, duonebs, O2.  Furosemide/Lasix BID via IVP.  Lung sounds improved per MD note.   - AKI: resolved. Monitor function daily. - A Fib: continue home meds. - Failure to Thrive/ Severe debility: PT was able to work with patient to get to edge of bed. Oxygen  demand was increased and O2 was bumped up to 10L HFNC per PT note.    Goals of Care: Ms. Gerdeman wishes to return to LTC once medically cleared and location obtained as she cannot return to her ALF now that she is requiring a higher level of care.  (hospital TOC working on disposition, beds have been offered, awaiting patient/family selection.  [facility] will continue to follow.)   Anticipated discharge to LTC w/ [facility] hospice following.    Report exchanged with TOC, Sarah.    Thank you for the opportunity to participate in this patient's care.    Kenna Gilbert BSN, RN  [facility] Liaison  [phone] Epic/Amion

## 2022-04-11 NOTE — TOC Progression Note (Addendum)
Transition of Care [facility]) - Progression Note    Patient Details  Name: [name] MRN: [id number] Date of Birth: 1941/04/15  Transition of Care [facility]) CM/SW [place], LCSW Phone Number: 04/11/2022, 1:18 PM  Clinical Narrative:  Checking with [facility] and [facility] regarding private pay cost and maximum oxygen requirements.  4:39 pm: Called daughter and provided information on [facility] for LTC and rehab. Left voicemail for [facility] admissions coordinator earlier today to inquire about LTC bed availability and cost.  Expected Discharge Plan and Services                                                 Social Determinants of Health (SDOH) Interventions    Readmission Risk Interventions     No data to display

## 2022-04-11 NOTE — Plan of Care (Signed)

## 2022-04-11 NOTE — Progress Notes (Addendum)
  Progress Note   Patient: [name] ALP:[id number] DOB: [birth year] DOA: 04/02/2022     9 DOS: the patient was seen and examined on 04/11/2022   Brief hospital course: [name] is a 81 y.o. female with medical history significant of severe COPD c/b chronic hypoxic and hypercapnic respiratory failure on 4 L [place] O2, severe pulmonary hypertension, systolic CHF, atrial fibrillation on Eliquis, peripheral artery disease, NSCLC s/p XRT/chemotherapy in remission, OSA not on CPAP, who presents to the ED with shortness of breath. She eventually required 8 L oxygen to maintain her oxygen saturation. She is treated with diuretics for CHF exacerbation. She also had a right-sided thoracentesis, removing 500 mL of fluids on 10/12. She is continued on IV Lasix.   Assessment and Plan:  Acute on chronic respiratory failure with hypoxemia and hypercapnia secondary to acute congestive heart failure, COPD is aspiration and severe pulm hypertension. Acute on chronic diastolic congestive heart failure. Right pleural effusion secondary to congestive heart failure. Recent echocardiogram performed in 11/2021 showed ejection fraction 60 to 65% with severe pulm hypertension. Patient is status post right side thoracentesis. Patient still requiring high flow oxygen, she was already on 4 L oxygen at home, she was on 6 L before admission to the hospital.  She may need high flow oxygen at 4 to 6 L at time of discharge. For now, we will continue IV Lasix until she can be transferred to nursing home.  Severe COPD Appears stable, no bronchospasm.   Severe pulm hypertension. Permanent atrial fibrillation with RVR. Heart rate is better controlled.   Acute kidney injury. Hypokalemia. Potassium is 3.4, will give additional 80 mEq of oral potassium.   Failure to thrive. Severe debility. Followed by hospice.  Assisted-living facility would not accept patient back, looking for long-term care with hospice  care.       Subjective:  Patient feels well today, but still has short of breath with exertion.  Physical Exam: Vitals:   04/11/22 0338 04/11/22 0735 04/11/22 0752 04/11/22 1147  BP: 102/83 107/83  118/79  Pulse:  99  93  Resp: (!) 24 (!) 22  (!) 21  Temp: (!) 97 F (36.1 C) (!) 97.5 F (36.4 C)  97.6 F (36.4 C)  TempSrc: Axillary Oral  Oral  SpO2:  92% 97% 96%  Weight:      Height:       General exam: Appears calm and comfortable  Respiratory system: Clear to auscultation. Respiratory effort normal. Cardiovascular system: Irregular. No JVD, murmurs, rubs, gallops or clicks. No pedal edema. Gastrointestinal system: Abdomen is nondistended, soft and nontender. No organomegaly or masses felt. Normal bowel sounds heard. Central nervous system: Alert and oriented x2. No focal neurological deficits. Extremities: Symmetric 5 x 5 power. Skin: No rashes, lesions or ulcers Psychiatry: Mood & affect appropriate.   Data Reviewed:  Lab results reviewed.  Family Communication: daughter updated  Disposition: Status is: Inpatient Remains inpatient appropriate because: Severity of disease, IV Lasix.  Also pending SNF placement.  Planned Discharge Destination: Skilled nursing facility    Time spent: 35 minutes  Author: Sharen Hones, MD 04/11/2022 12:29 PM  For on call review [email or web address].

## 2022-04-11 NOTE — Progress Notes (Signed)
Occupational Therapy Treatment Patient Details Name: [name] MRN: [id number] DOB: 1941/06/05 Today's Date: 04/11/2022   History of present illness Nelia Rogoff is a 81 y.o. female with medical history significant of severe COPD c/b chronic hypoxic and hypercapnic respiratory failure on 4 L [place] O2, severe pulmonary hypertension, systolic CHF, atrial fibrillation on Eliquis, peripheral artery disease, NSCLC s/p XRT/chemotherapy in remission, OSA not on CPAP, who presents to the ED with shortness of breath.   OT comments  Pt seen for OT tx. Pt agreeable and denies SOB. Pt required [place] for bed mobility, CGA-MIN A to stand and step pivot to the recliner. SpO2 >95%, 6L O2, denied SOB throughout. Pt educated in incentive spirometer to improve pulmonary status, requiring VC for improving technique. Pt continues to benefit from skilled OT services. Continue to recommend [place].    Recommendations for follow up therapy are one component of a multi-disciplinary discharge planning process, led by the attending physician.  Recommendations may be updated based on patient status, additional functional criteria and insurance authorization.    Follow Up Recommendations  Home health OT    Assistance Recommended at Discharge Frequent or constant Supervision/Assistance  Patient can return home with the following  A little help with walking and/or transfers;A little help with bathing/dressing/bathroom;Assist for transportation;Help with stairs or ramp for entrance   Equipment Recommendations  Other (comment) (2ww)    Recommendations for Other Services      Precautions / Restrictions Precautions Precautions: Fall Restrictions Weight Bearing Restrictions: No       Mobility Bed Mobility Overal bed mobility: Needs Assistance Bed Mobility: Supine to Sit     Supine to sit: Min assist, HOB elevated     General bed mobility comments: Pt required assist for shifting hips to EOB     Transfers Overall transfer level: Needs assistance Equipment used: Rolling walker (2 wheels) Transfers: Sit to/from Stand, Bed to chair/wheelchair/BSC Sit to Stand: Min guard, Min assist     Step pivot transfers: Min guard           Balance Overall balance assessment: Needs assistance Sitting-balance support: Feet unsupported, Bilateral upper extremity supported, Single extremity supported Sitting balance-Leahy Scale: Fair     Standing balance support: Bilateral upper extremity supported, During functional activity Standing balance-Leahy Scale: Fair                             ADL either performed or assessed with clinical judgement   ADL Overall ADL's : Needs assistance/impaired     Grooming: Sitting;Set up                                      Extremity/Trunk Assessment              Vision       Perception     Praxis      Cognition Arousal/Alertness: Awake/alert Behavior During Therapy: WFL for tasks assessed/performed Overall Cognitive Status: Within Functional Limits for tasks assessed                                          Exercises  Pt educated in incentive spirometer to improve pulmonary status, requiring VC for improving technique    Shoulder Instructions  General Comments SpO2 >95%, 6L O2, denied SOB    Pertinent Vitals/ Pain       Pain Assessment Pain Assessment: No/denies pain  Home Living                                          Prior Functioning/Environment              Frequency  Min 2X/week        Progress Toward Goals  OT Goals(current goals can now be found in the care plan section)  Progress towards OT goals: Progressing toward goals  Acute Rehab OT Goals Patient Stated Goal: to return home OT Goal Formulation: With patient Time For Goal Achievement: 04/17/22 Potential to Achieve Goals: [place] Discharge plan remains  appropriate;Frequency remains appropriate    Co-evaluation                 AM-PAC OT "6 Clicks" Daily Activity     Outcome Measure   Help from another person eating meals?: None Help from another person taking care of personal grooming?: None Help from another person toileting, which includes using toliet, bedpan, or urinal?: A Little Help from another person bathing (including washing, rinsing, drying)?: A Little Help from another person to put on and taking off regular upper body clothing?: None Help from another person to put on and taking off regular lower body clothing?: A Little 6 Click Score: 21    End of Session Equipment Utilized During Treatment: Oxygen;Rolling walker (2 wheels)  OT Visit Diagnosis: Unsteadiness on feet (R26.81);Muscle weakness (generalized) (M62.81)   Activity Tolerance Patient tolerated treatment well   Patient Left in chair;with call bell/phone within reach;with chair alarm set   Nurse Communication Mobility status        Time: 1130-1143 OT Time Calculation (min): 13 min  Charges: OT General Charges $OT Visit: 1 Visit OT Treatments $Therapeutic Activity: 8-22 mins  Ardeth Perfect., MPH, MS, OTR/L ascom [phone] 04/11/22, 1:41 PM

## 2022-04-12 DIAGNOSIS — J9621 Acute and chronic respiratory failure with hypoxia: Secondary | ICD-10-CM | POA: Diagnosis not present

## 2022-04-12 DIAGNOSIS — I4821 Permanent atrial fibrillation: Secondary | ICD-10-CM | POA: Diagnosis not present

## 2022-04-12 DIAGNOSIS — I272 Pulmonary hypertension, unspecified: Secondary | ICD-10-CM | POA: Diagnosis not present

## 2022-04-12 DIAGNOSIS — J9622 Acute and chronic respiratory failure with hypercapnia: Secondary | ICD-10-CM | POA: Diagnosis not present

## 2022-04-12 LAB — BASIC METABOLIC PANEL
Anion gap: 8 (ref 5–15)
BUN: 29 mg/dL — ABNORMAL HIGH (ref 8–23)
CO2: 37 mmol/L — ABNORMAL HIGH (ref 22–32)
Calcium: 8.9 mg/dL (ref 8.9–10.3)
Chloride: 95 mmol/L — ABNORMAL LOW (ref 98–111)
Creatinine, Ser: 0.94 mg/dL (ref 0.44–1.00)
GFR, Estimated: >60 mL/min (ref >60)
Glucose, Bld: 120 mg/dL — ABNORMAL HIGH (ref 70–99)
Potassium: 3.7 mmol/L (ref 3.5–5.1)
Sodium: 140 mmol/L (ref 135–145)

## 2022-04-12 LAB — MAGNESIUM: Magnesium: 2 mg/dL (ref 1.7–2.4)

## 2022-04-12 MED ORDER — METOLAZONE 5 MG PO TABS
5.0000 mg | ORAL_TABLET | Freq: Once | ORAL | Status: AC
  Administered 2022-04-12: 5 mg via ORAL
  Filled 2022-04-12: qty 1

## 2022-04-12 NOTE — Plan of Care (Signed)
  Problem: Education: Goal: Knowledge of General Education information will improve Description: Including pain rating scale, medication(s)/side effects and non-pharmacologic comfort measures Outcome: Progressing   Problem: Health Behavior/Discharge Planning: Goal: Ability to manage health-related needs will improve Outcome: Progressing   Problem: Clinical Measurements: Goal: Ability to maintain clinical measurements within normal limits will improve Outcome: Progressing Goal: Will remain free from infection Outcome: Progressing Goal: Diagnostic test results will improve Outcome: Progressing Goal: Respiratory complications will improve Outcome: Progressing Goal: Cardiovascular complication will be avoided Outcome: Progressing   Problem: Activity: Goal: Risk for activity intolerance will decrease Outcome: Progressing   Problem: Nutrition: Goal: Adequate nutrition will be maintained Outcome: Progressing   Problem: Coping: Goal: Level of anxiety will decrease Outcome: Progressing   Problem: Elimination: Goal: Will not experience complications related to bowel motility Outcome: Progressing Goal: Will not experience complications related to urinary retention Outcome: Progressing   Problem: Pain Managment: Goal: General experience of comfort will improve Outcome: Progressing   Problem: Safety: Goal: Ability to remain free from injury will improve Outcome: Progressing   Problem: Skin Integrity: Goal: Risk for impaired skin integrity will decrease Outcome: Progressing   Problem: Education: Goal: Knowledge of disease or condition will improve Outcome: Progressing Goal: Knowledge of the prescribed therapeutic regimen will improve Outcome: Progressing Goal: Individualized Educational Video(s) Outcome: Progressing   Problem: Activity: Goal: Ability to tolerate increased activity will improve Outcome: Progressing   Problem: Respiratory: Goal: Ability to maintain a  clear airway will improve Outcome: Progressing

## 2022-04-12 NOTE — Progress Notes (Signed)
Cpap declined 

## 2022-04-12 NOTE — Progress Notes (Signed)
  Progress Note   Patient: [name] NOB:[id number] DOB: [birth year] DOA: 04/02/2022     10 DOS: the patient was seen and examined on 04/12/2022   Brief hospital course: [name] is a 81 y.o. female with medical history significant of severe COPD c/b chronic hypoxic and hypercapnic respiratory failure on 4 L [place] O2, severe pulmonary hypertension, systolic CHF, atrial fibrillation on Eliquis, peripheral artery disease, NSCLC s/p XRT/chemotherapy in remission, OSA not on CPAP, who presents to the ED with shortness of breath. She eventually required 8 L oxygen to maintain her oxygen saturation. She is treated with diuretics for CHF exacerbation. She also had a right-sided thoracentesis, removing 500 mL of fluids on 10/12. She is continued on IV Lasix.   Assessment and Plan: Acute on chronic respiratory failure with hypoxemia and hypercapnia secondary to acute congestive heart failure, COPD and severe pulm hypertension. Acute on chronic diastolic congestive heart failure. Right pleural effusion secondary to congestive heart failure. Elevated troponin secondary to demand ischemia. Recent echocardiogram performed in 11/2021 showed ejection fraction 60 to 65% with severe pulm hypertension. Patient is status post right side thoracentesis. Patient still has volume overload, continue IV Lasix, Aldactone.  Give a dose of metolazone.  Recheck BMP and magnesium tomorrow.   Severe COPD Appears stable, no bronchospasm.   Severe pulm hypertension. Permanent atrial fibrillation with RVR. Heart rate is better controlled.   Acute kidney injury. Hypokalemia. Potassium is 3.4, will give additional 80 mEq of oral potassium.   Failure to thrive. Severe debility. Followed by hospice.  Assisted-living facility would not accept patient back, looking for long-term care with hospice care.      Subjective:  Patient still has significant short of breath with mid exertion, still 60s oxygen.  No  cough.  Physical Exam: Vitals:   04/12/22 0426 04/12/22 0739 04/12/22 0751 04/12/22 1150  BP: 113/66 116/83  103/75  Pulse: 98 94 93 76  Resp: 20 20 18 20   Temp: 97.6 F (36.4 C) 97.8 F (36.6 C)  97.7 F (36.5 C)  TempSrc: Oral Oral  Oral  SpO2: 94% 93% 94% 94%  Weight:      Height:       General exam: Appears calm and comfortable  Respiratory system: Crackles in the bases bilaterally. Respiratory effort normal. Cardiovascular system: Irregular. No JVD, murmurs, rubs, gallops or clicks.  Gastrointestinal system: Abdomen is nondistended, soft and nontender. No organomegaly or masses felt. Normal bowel sounds heard. Central nervous system: Alert and oriented. No focal neurological deficits. Extremities: Symmetric 5 x 5 power. Skin: No rashes, lesions or ulcers Psychiatry: Judgement and insight appear normal. Mood & affect appropriate.   Data Reviewed:  Lab results reviewed.  Family Communication:   Disposition: Status is: Inpatient Remains inpatient appropriate because: Severity of disease, IV treatment.  Unsafe discharge pending long-term care transfer.  Planned Discharge Destination:  Long term care    Time spent: 35 minutes  Author: Sharen Hones, MD 04/12/2022 12:15 PM  For on call review [email or web address].

## 2022-04-12 NOTE — Progress Notes (Signed)
[facility] rm [address] [employer] [facility]) Hospitalized Hospice Patient   [name] is a current [facility] patient with a terminal diagnosis of hypertensive heart disease with heart failure. Patient began experiencing worsening respiratory distress at her facility and was evaluated by [facility] nurse. Decision was made in part due to her code status as well as per request of patient to have her evaluated in ED. She is admitted with a diagnosis of acute on chronic respiratory failure. Per Dr. Cherie Ouch with [facility] this is a related hospice admission.   [name] remains short of breath at times, although she reports to feeling ok today. She is awake and responsive. They continue to treat with IV Lasix for volume overload. She remains on 6L HFNC and still becomes short of breath when trying to complete a sentence.   Patient is inpatient appropriate with IV Lasix and high O2 demand necessitating higher level monitoring.   V/S: 97.7/76/20   103/75   94% L HFNC  I&O: 240/1800  Labs: Chloride 95, CO2 37, BUN 29  Diagnostics: None new   IV/PRN: Lasix 40mg  IV BID   Problem List: Acute on chronic respiratory failure with hypoxemia and hypercapnia secondary to acute congestive heart failure, COPD and severe pulm hypertension. Acute on chronic diastolic congestive heart failure. Right pleural effusion secondary to congestive heart failure. Elevated troponin secondary to demand ischemia. Recent echocardiogram performed in 11/2021 showed ejection fraction 60 to 65% with severe pulm hypertension. Patient is status post right side thoracentesis. Patient still has volume overload, continue IV Lasix, Aldactone.  Give a dose of metolazone.  Recheck BMP and magnesium tomorrow.  D/C planning: Ongoing, they are currently looking for LTC Family: Talked with daughters by phone, their goal is for patient to stabilize and go to facility with hospice IDT: Updated GOC: DNR   Thank you, Jhonnie Garner, BSN, RN,  [facility] hospital liaison [phone] or Epic chat

## 2022-04-13 DIAGNOSIS — J9621 Acute and chronic respiratory failure with hypoxia: Secondary | ICD-10-CM | POA: Diagnosis not present

## 2022-04-13 DIAGNOSIS — I5033 Acute on chronic diastolic (congestive) heart failure: Secondary | ICD-10-CM | POA: Diagnosis not present

## 2022-04-13 DIAGNOSIS — J9622 Acute and chronic respiratory failure with hypercapnia: Secondary | ICD-10-CM | POA: Diagnosis not present

## 2022-04-13 DIAGNOSIS — I272 Pulmonary hypertension, unspecified: Secondary | ICD-10-CM | POA: Diagnosis not present

## 2022-04-13 LAB — BASIC METABOLIC PANEL
Anion gap: 8 (ref 5–15)
BUN: 28 mg/dL — ABNORMAL HIGH (ref 8–23)
CO2: 40 mmol/L — ABNORMAL HIGH (ref 22–32)
Calcium: 9 mg/dL (ref 8.9–10.3)
Chloride: 89 mmol/L — ABNORMAL LOW (ref 98–111)
Creatinine, Ser: 0.88 mg/dL (ref 0.44–1.00)
GFR, Estimated: >60 mL/min (ref >60)
Glucose, Bld: 102 mg/dL — ABNORMAL HIGH (ref 70–99)
Potassium: 3.7 mmol/L (ref 3.5–5.1)
Sodium: 137 mmol/L (ref 135–145)

## 2022-04-13 LAB — MAGNESIUM: Magnesium: 2 mg/dL (ref 1.7–2.4)

## 2022-04-13 MED ORDER — TORSEMIDE 20 MG PO TABS
40.0000 mg | ORAL_TABLET | Freq: Two times a day (BID) | ORAL | Status: DC
  Administered 2022-04-13 – 2022-04-17 (×9): 40 mg via ORAL
  Filled 2022-04-13 (×10): qty 2

## 2022-04-13 NOTE — Progress Notes (Signed)
  Progress Note   Patient: [name] RAQ:[id number] DOB: Aug 22, 1940 DOA: 04/02/2022     11 DOS: the patient was seen and examined on 04/13/2022   Brief hospital course: Angelee Bahr is a 81 y.o. female with medical history significant of severe COPD c/b chronic hypoxic and hypercapnic respiratory failure on 4 L [place] O2, severe pulmonary hypertension, systolic CHF, atrial fibrillation on Eliquis, peripheral artery disease, NSCLC s/p XRT/chemotherapy in remission, OSA not on CPAP, who presents to the ED with shortness of breath. She eventually required 8 L oxygen to maintain her oxygen saturation. She is treated with diuretics for CHF exacerbation. She also had a right-sided thoracentesis, removing 500 mL of fluids on 10/12. She is continued on IV Lasix.   Assessment and Plan: Acute on chronic respiratory failure with hypoxemia and hypercapnia secondary to acute congestive heart failure, COPD and severe pulm hypertension. Acute on chronic diastolic congestive heart failure. Right pleural effusion secondary to congestive heart failure. Elevated troponin secondary to demand ischemia. Recent echocardiogram performed in 11/2021 showed ejection fraction 60 to 65% with severe pulm hypertension. Patient is status post right side thoracentesis. Volume status better, oxygen use dropped down to 5 L, I will change to oral diuretics with torsemide and Aldactone in preparation for transfer to long-term care with hospice.   Severe COPD Appears stable, no bronchospasm.   Severe pulm hypertension. Permanent atrial fibrillation with RVR. Poor prognosis due to severe pulm hypertension.  Continue current treatment.   Acute kidney injury. Hypokalemia. Improved.   Failure to thrive. Severe debility. Followed by hospice.  Assisted-living facility would not accept patient back, looking for long-term care with hospice care.      Subjective:  Patient shortness of breath is better, oxygenation  better today.  Physical Exam: Vitals:   04/13/22 0500 04/13/22 0700 04/13/22 0807 04/13/22 1100  BP: 105/70 102/71  96/72  Pulse:   100   Resp: 20 (!) 27 (!) 23 20  Temp:  98.5 F (36.9 C)  98.3 F (36.8 C)  TempSrc:  Oral  Oral  SpO2:  94% 93% 91%  Weight:      Height:       General exam: Appears calm and comfortable  Respiratory system: Decreased breathing sounds without crackles. Respiratory effort normal. Cardiovascular system: Irregular. No JVD, murmurs, rubs, gallops or clicks. No pedal edema. Gastrointestinal system: Abdomen is nondistended, soft and nontender. No organomegaly or masses felt. Normal bowel sounds heard. Central nervous system: Alert and oriented x2. No focal neurological deficits. Extremities: Symmetric 5 x 5 power. Skin: No rashes, lesions or ulcers Psychiatry: Mood & affect appropriate.   Data Reviewed:  Lab results reviewed.  Family Communication:   Disposition: Status is: Inpatient Remains inpatient appropriate because: Unsafe discharge, pending transfer to long-term care facility with hospice.  Planned Discharge Destination:  Long-term care    Time spent: 35 minutes  Author: Sharen Hones, MD 04/13/2022 11:54 AM  For on call review [email or web address].

## 2022-04-13 NOTE — Progress Notes (Addendum)
[facility]) Hospital Note  [name] is a current [facility] patient with a terminal diagnosis of hypertensive heart disease w/ heart failure.  Patient experienced worsening respiratory distress at her facility, [place], [facility] RN evaluated and decided to have her evaluated in the ED.  [name] was admitted to [facility] 10/11 for acute on chronic respiratory failure.  Per [facility] MD, Dr. Cherie Ouch, this is a related hospice admission.  Visited [name] at the bedside.  Patient stated that she was "doing fine." Still experiencing shortness of breath.    Patient is inpatient appropriate while managing shortness of breath and oxygen dependency necessitating higher level of monitoring.    VS: 98.3 oral  96/72 HR 85 RR 20 91% 5L HFNC   I&O: Cumulative -1500 (no input documented)   Abnormal Labs:  CO2 40 BUN 28 Cr 0.88  Diagnostics:  No new imaging orders 10/16 CXR: increased bilateral interstitial opacities, likely due to worsening pulmonary edema.  IV/PRN Medications:  Furosemide IVP switched to PO this afternoon.  Will monitor   Problems List:  - Acute on Chronic Respiratory Failure/ COPD/ Pleural Effusion: baseline O2 requirement is 4L/M, now requiring high flow nasal cannula at 6L/M.  CTchest Neg for PE.  S/P thoracentesis yeilding 564mL. Recent pulmonary note stated increasing basline O2 to 6L/M.  Continue steroids, duonebs, O2.  Diuretics switched to PO today.     - AKI: resolved. Monitor function daily. - A Fib: continue home meds. - Failure to Thrive/ Severe debility: PT was able to work with patient to get to edge of bed.   Goals of Care: [name] wishes to return to LTC once medically cleared and location obtained as she cannot return to her ALF now that she is requiring a higher level of care.  (hospital TOC working on disposition, beds have been offered, awaiting patient/family selection.  [facility] will continue to follow.)   Anticipated discharge to LTC w/  [facility] hospice following.    Report exchanged with TOC, Sarah.    Thank you for the opportunity to participate in this patient's care.    Kenna Gilbert BSN, RN  [facility] Liaison  [phone] Epic/Amion

## 2022-04-14 DIAGNOSIS — I272 Pulmonary hypertension, unspecified: Secondary | ICD-10-CM | POA: Diagnosis not present

## 2022-04-14 DIAGNOSIS — J9622 Acute and chronic respiratory failure with hypercapnia: Secondary | ICD-10-CM | POA: Diagnosis not present

## 2022-04-14 DIAGNOSIS — I5033 Acute on chronic diastolic (congestive) heart failure: Secondary | ICD-10-CM | POA: Diagnosis not present

## 2022-04-14 DIAGNOSIS — J9621 Acute and chronic respiratory failure with hypoxia: Secondary | ICD-10-CM | POA: Diagnosis not present

## 2022-04-14 MED ORDER — METOLAZONE 2.5 MG PO TABS
2.5000 mg | ORAL_TABLET | Freq: Every day | ORAL | Status: DC
  Administered 2022-04-14: 2.5 mg via ORAL
  Filled 2022-04-14 (×2): qty 1

## 2022-04-14 NOTE — Progress Notes (Addendum)
12:15pm Call placed to [facility] to follow up on bed offer. No answer.   12:17pm Call placed to [facility] to follow up on bed offer.  12:30pm  Attempt to contact paitent's daughter regarding discharge plan. No answer. Left a message.   12;50pm Retrieved call from patient's daughter's Anne Ng and Manuela Schwartz. They stated patient has [place]. Family will pay any remaining balance for hospice care at facility that will accept her. Advised family patient needs for HFNC remain at 7L. Family advised most facilities will accept 5L of HFNC and under.   1:10pm Contacted Tora Kindred at [facility]. Facility considering. Advised of LTC benefit, needs for hospice and HFNC @ 7L.   2:15pm Retrieved call from [place] in admissions at [facility]. Facility unable to accept patient at current HFNC of 7L.

## 2022-04-14 NOTE — Progress Notes (Signed)
  Progress Note   Patient: [name] LFY:[id number] DOB: 01/22/1941 DOA: 04/02/2022     12 DOS: the patient was seen and examined on 04/14/2022   Brief hospital course: [name] is a 81 y.o. female with medical history significant of severe COPD c/b chronic hypoxic and hypercapnic respiratory failure on 4 L [place] O2, severe pulmonary hypertension, systolic CHF, atrial fibrillation on Eliquis, peripheral artery disease, NSCLC s/p XRT/chemotherapy in remission, OSA not on CPAP, who presents to the ED with shortness of breath. She eventually required 8 L oxygen to maintain her oxygen saturation. She is treated with diuretics for CHF exacerbation. She also had a right-sided thoracentesis, removing 500 mL of fluids on 10/12. She is continued on IV Lasix.   Assessment and Plan: Acute on chronic respiratory failure with hypoxemia and hypercapnia secondary to acute congestive heart failure, COPD and severe pulm hypertension. Acute on chronic diastolic congestive heart failure. Right pleural effusion secondary to congestive heart failure. Elevated troponin secondary to demand ischemia. Recent echocardiogram performed in 11/2021 showed ejection fraction 60 to 65% with severe pulm hypertension. Patient is status post right side thoracentesis. Patient had increased oxygen requirement to 7 L last night.  We will continue torsemide and Aldactone, also added metolazone 2.5 mg daily. Patient condition is very sensitive to volume status.  Per poor prognosis.  We will continue to treat until discharge to long-term care with hospice.   Severe COPD Appears stable, no bronchospasm.   Severe pulm hypertension. Permanent atrial fibrillation with RVR. Poor prognosis due to severe pulm hypertension.  Continue current treatment.   Acute kidney injury. Hypokalemia. Improved.   Failure to thrive. Severe debility. Followed by hospice.  Assisted-living facility would not accept patient back, looking  for long-term care with hospice care.      Subjective:  Patient had episode of hypoxemia last night, oxygen increased 7 L.    Physical Exam: Vitals:   04/14/22 0737 04/14/22 0800 04/14/22 0900 04/14/22 1209  BP: 101/70 100/77  104/64  Pulse: 85 97 85 91  Resp: (!) 22 20 20  (!) 23  Temp: 98.3 F (36.8 C)     TempSrc: Oral     SpO2: 97% 93% 95% 95%  Weight:      Height:       General exam: Appears calm and comfortable  Respiratory system: Bilateral crackles in the base. Respiratory effort normal. Cardiovascular system: S1 & S2 heard, RRR. No JVD, murmurs, rubs, gallops or clicks.  Gastrointestinal system: Abdomen is nondistended, soft and nontender. No organomegaly or masses felt. Normal bowel sounds heard. Central nervous system: Alert and oriented x2. No focal neurological deficits. Extremities: Trace edema. Skin: No rashes, lesions or ulcers Psychiatry: Mood & affect appropriate.   Data Reviewed:  Lab results reviewed.  Family Communication:   Disposition: Status is: Inpatient Remains inpatient appropriate because: Severity of disease, unsafe discharge.  Planned Discharge Destination:  Long-term care with hospice.    Time spent: 35 minutes  Author: Sharen Hones, MD 04/14/2022 12:19 PM  For on call review [email or web address].

## 2022-04-14 NOTE — Progress Notes (Signed)
Physical Therapy Treatment Patient Details Name: [name] MRN: [id number] DOB: [birth year] Today's Date: 04/14/2022   History of Present Illness [name] is a 81 y.o. female with medical history significant of severe COPD c/b chronic hypoxic and hypercapnic respiratory failure on 4 L [place] O2, severe pulmonary hypertension, systolic CHF, atrial fibrillation on Eliquis, peripheral artery disease, NSCLC s/p XRT/chemotherapy in remission, OSA not on CPAP, who presents to the ED with shortness of breath.    PT Comments    Pt seen for PT tx with pt received in bed attempting to eat lunch. Pt agreeable to get OOB to recliner to finish lunch. Pt is able to complete bed mobility with hospital bed features & supervision, but requires min assist to power up for stand pivot bed>recliner with min assist overall. Session limited by pt wishing to finish lunch but reports she feels better eating in recliner. Will continue to follow pt acutely to progress gait as able.    Recommendations for follow up therapy are one component of a multi-disciplinary discharge planning process, led by the attending physician.  Recommendations may be updated based on patient status, additional functional criteria and insurance authorization.  Follow Up Recommendations  Home health PT     Assistance Recommended at Discharge Frequent or constant Supervision/Assistance  Patient can return home with the following A little help with walking and/or transfers;A little help with bathing/dressing/bathroom;Assistance with cooking/housework;Assist for transportation;Help with stairs or ramp for entrance   Equipment Recommendations  Rolling walker (2 wheels)    Recommendations for Other Services       Precautions / Restrictions Precautions Precautions: Fall Restrictions Weight Bearing Restrictions: No     Mobility  Bed Mobility Overal bed mobility: Needs Assistance Bed Mobility: Supine to Sit     Supine to  sit: Supervision, HOB elevated     General bed mobility comments: bed rails    Transfers Overall transfer level: Needs assistance Equipment used: None Transfers: Bed to chair/wheelchair/BSC   Stand pivot transfers: Min assist              Ambulation/Gait                   Stairs             Wheelchair Mobility    Modified Rankin (Stroke Patients Only)       Balance Overall balance assessment: Needs assistance Sitting-balance support: Feet unsupported, Feet supported Sitting balance-Leahy Scale: Fair     Standing balance support: Single extremity supported, During functional activity Standing balance-Leahy Scale: Fair                              Cognition Arousal/Alertness: Awake/alert Behavior During Therapy: WFL for tasks assessed/performed Overall Cognitive Status: Within Functional Limits for tasks assessed                                          Exercises      General Comments General comments (skin integrity, edema, etc.): Pt on 6L/min supplemental O2 via HFNC, SPO2 WNL, HR up to 123 bpm      Pertinent Vitals/Pain Pain Assessment Pain Assessment: No/denies pain    Home Living  Prior Function            PT Goals (current goals can now be found in the care plan section) Acute Rehab PT Goals Patient Stated Goal: feel better PT Goal Formulation: With patient Time For Goal Achievement: 04/17/22 Potential to Achieve Goals: Fair Progress towards PT goals: Progressing toward goals    Frequency    Min 2X/week      PT Plan Current plan remains appropriate    Co-evaluation              AM-PAC PT "6 Clicks" Mobility   Outcome Measure  Help needed turning from your back to your side while in a flat bed without using bedrails?: None Help needed moving from lying on your back to sitting on the side of a flat bed without using bedrails?: A Little Help  needed moving to and from a bed to a chair (including a wheelchair)?: A Little Help needed standing up from a chair using your arms (e.g., wheelchair or bedside chair)?: A Little Help needed to walk in hospital room?: A Little Help needed climbing 3-5 steps with a railing? : A Lot 6 Click Score: 18    End of Session Equipment Utilized During Treatment: Oxygen Activity Tolerance: Patient tolerated treatment well (limited by wanting to finish lunch) Patient left: in chair;with call bell/phone within reach Nurse Communication: Mobility status PT Visit Diagnosis: Other abnormalities of gait and mobility (R26.89);Muscle weakness (generalized) (M62.81)     Time: 7121-9758 PT Time Calculation (min) (ACUTE ONLY): 11 min  Charges:  $Therapeutic Activity: 8-22 mins                     Lavone Nian, PT, DPT 04/14/22, 1:51 PM  Waunita Schooner 04/14/2022, 1:50 PM

## 2022-04-14 NOTE — Plan of Care (Signed)

## 2022-04-14 NOTE — Progress Notes (Signed)
[facility]) Hospital Note  [name] is a current [facility] patient with a terminal diagnosis of hypertensive heart disease w/ heart failure.  Patient experienced worsening respiratory distress at her facility, [place], [facility] RN evaluated and decided to have her evaluated in the ED.  [name] was admitted to [facility] 10/11 for acute on chronic respiratory failure.  Per [facility] MD, Dr. Cherie Ouch, this is a related hospice admission.  Visited [name] at the bedside, no complaints, asking about discharge.  Updated that TOC is working with her daughters to determine LTC facility.    Patient is inpatient appropriate while managing shortness of breath and oxygen dependency necessitating higher level of monitoring.    VS: 98.3 oral 104/64 HR 91  RR 23 95% 7L HFNC   I&O: Cumulative -2450 (no input documented)   Abnormal Labs:  No labs ordered 10/23  Diagnostics:  No new imaging orders 10/16 CXR: increased bilateral interstitial opacities, likely due to worsening pulmonary edema.  IV/PRN Medications: No PRN medications Diuretics changed to PO yesterday 10/22 and additional metolazone added 10/23.     Problems List:  - Acute on Chronic Respiratory Failure/ COPD/ Pleural Effusion: baseline O2 requirement is 4L/M, now requiring high flow nasal cannula at 6L/M.  CTchest Neg for PE.  S/P thoracentesis yeilding 575mL. Recent pulmonary note stated increasing basline O2 to 6L/M.  Continue steroids, duonebs, O2.  Diuretics switched to PO today.  Metolazone 2.5 mg added 10/23 - AKI: resolved. Monitor function daily. - A Fib: continue home meds. - Failure to Thrive/ Severe debility: PT was able to work with patient to get to edge of bed.   Goals of Care: [name] wishes to return to LTC once medically cleared and location obtained as she cannot return to her ALF now that she is requiring a higher level of care.  (hospital TOC working on disposition, beds have been offered,  awaiting patient/family selection.  [facility] will continue to follow.)   Anticipated discharge to LTC w/ [facility] hospice following.    Report exchanged with TOC, Sarah.    Thank you for the opportunity to participate in this patient's care.    [name] BSN, RN  [facility] Liaison  [phone] Epic/Amion

## 2022-04-14 NOTE — Progress Notes (Signed)
Occupational Therapy Treatment Patient Details Name: [name] MRN: [id number] DOB: 1941-04-05 Today's Date: 04/14/2022   History of present illness [name] is a 81 y.o. female with medical history significant of severe COPD c/b chronic hypoxic and hypercapnic respiratory failure on 4 L  O2, severe pulmonary hypertension, systolic CHF, atrial fibrillation on Eliquis, peripheral artery disease, NSCLC s/p XRT/chemotherapy in remission, OSA not on CPAP, who presents to the ED with shortness of breath.   OT comments  Pt seen for OT tx this date. Pt in recliner, finished with lunch, and endorsing desire to return to bed. Pt instructed in BLE movement while seated as warm up to standing. After doing so pt endorses moderate SOB. Questionable pleth, but when hand is still, SpO2 >92% on 7L. Pt required MIN A to stand from the recliner and CGA for step pivot to EOB with RW and increased time/effort. Slight posterior lean requiring VC for anterior weight shift to improve. Pt continues to benefit from skilled OT services to address impairments in strength, balance, increased O2 needs, and activity tolerance in order to maximize independence with ADL/mobility.    Recommendations for follow up therapy are one component of a multi-disciplinary discharge planning process, led by the attending physician.  Recommendations may be updated based on patient status, additional functional criteria and insurance authorization.    Follow Up Recommendations  Home health OT    Assistance Recommended at Discharge Frequent or constant Supervision/Assistance  Patient can return home with the following  A little help with walking and/or transfers;A little help with bathing/dressing/bathroom;Assist for transportation;Help with stairs or ramp for entrance   Equipment Recommendations  Other (comment) (2ww)    Recommendations for Other Services      Precautions / Restrictions Precautions Precautions:  Fall Restrictions Weight Bearing Restrictions: No       Mobility Bed Mobility Overal bed mobility: Needs Assistance Bed Mobility: Sit to Supine       Sit to supine: Min assist   General bed mobility comments: MIN A for BLE mgt    Transfers Overall transfer level: Needs assistance Equipment used: Rolling walker (2 wheels) Transfers: Sit to/from Stand, Bed to chair/wheelchair/BSC Sit to Stand: Min assist     Step pivot transfers: Min guard           Balance Overall balance assessment: Needs assistance Sitting-balance support: Feet unsupported, Feet supported, No upper extremity supported Sitting balance-Leahy Scale: Fair     Standing balance support: Bilateral upper extremity supported, During functional activity, Reliant on assistive device for balance Standing balance-Leahy Scale: Fair                             ADL either performed or assessed with clinical judgement   ADL                                              Extremity/Trunk Assessment              Vision       Perception     Praxis      Cognition Arousal/Alertness: Awake/alert Behavior During Therapy: WFL for tasks assessed/performed Overall Cognitive Status: Within Functional Limits for tasks assessed  Exercises      Shoulder Instructions       General Comments Pt on 6L/min supplemental O2 via HFNC, SPO2 WNL, HR up to 123 bpm    Pertinent Vitals/ Pain       Pain Assessment Pain Assessment: No/denies pain  Home Living                                          Prior Functioning/Environment              Frequency  Min 2X/week        Progress Toward Goals  OT Goals(current goals can now be found in the care plan section)  Progress towards OT goals: Progressing toward goals  Acute Rehab OT Goals Patient Stated Goal: to return home OT Goal Formulation:  With patient Time For Goal Achievement: 04/17/22 Potential to Achieve Goals: [place] Discharge plan remains appropriate;Frequency remains appropriate    Co-evaluation                 AM-PAC OT "6 Clicks" Daily Activity     Outcome Measure   Help from another person eating meals?: None Help from another person taking care of personal grooming?: None Help from another person toileting, which includes using toliet, bedpan, or urinal?: A Little Help from another person bathing (including washing, rinsing, drying)?: A Little Help from another person to put on and taking off regular upper body clothing?: None Help from another person to put on and taking off regular lower body clothing?: A Little 6 Click Score: 21    End of Session Equipment Utilized During Treatment: Oxygen;Rolling walker (2 wheels)  OT Visit Diagnosis: Unsteadiness on feet (R26.81);Muscle weakness (generalized) (M62.81)   Activity Tolerance Patient tolerated treatment well   Patient Left in bed;with call bell/phone within reach;with bed alarm set   Nurse Communication          Time: 2025-4270 OT Time Calculation (min): 12 min  Charges: OT General Charges $OT Visit: 1 Visit OT Treatments $Self Care/Home Management : 8-22 mins  Ardeth Perfect., MPH, MS, OTR/L ascom [phone] 04/14/22, 3:42 PM

## 2022-04-15 ENCOUNTER — Inpatient Hospital Stay

## 2022-04-15 DIAGNOSIS — J9621 Acute and chronic respiratory failure with hypoxia: Secondary | ICD-10-CM | POA: Diagnosis not present

## 2022-04-15 DIAGNOSIS — J90 Pleural effusion, not elsewhere classified: Secondary | ICD-10-CM | POA: Diagnosis not present

## 2022-04-15 DIAGNOSIS — I5033 Acute on chronic diastolic (congestive) heart failure: Secondary | ICD-10-CM | POA: Diagnosis not present

## 2022-04-15 DIAGNOSIS — I272 Pulmonary hypertension, unspecified: Secondary | ICD-10-CM | POA: Diagnosis not present

## 2022-04-15 LAB — BASIC METABOLIC PANEL
Anion gap: 11 (ref 5–15)
BUN: 28 mg/dL — ABNORMAL HIGH (ref 8–23)
CO2: 45 mmol/L — ABNORMAL HIGH (ref 22–32)
Calcium: 9.4 mg/dL (ref 8.9–10.3)
Chloride: 83 mmol/L — ABNORMAL LOW (ref 98–111)
Creatinine, Ser: 0.92 mg/dL (ref 0.44–1.00)
GFR, Estimated: >60 mL/min (ref >60)
Glucose, Bld: 105 mg/dL — ABNORMAL HIGH (ref 70–99)
Potassium: 2.9 mmol/L — ABNORMAL LOW (ref 3.5–5.1)
Sodium: 139 mmol/L (ref 135–145)

## 2022-04-15 LAB — GLUCOSE, CAPILLARY: Glucose-Capillary: 157 mg/dL — ABNORMAL HIGH (ref 70–99)

## 2022-04-15 LAB — MAGNESIUM: Magnesium: 1.9 mg/dL (ref 1.7–2.4)

## 2022-04-15 LAB — POTASSIUM: Potassium: 3.5 mmol/L (ref 3.5–5.1)

## 2022-04-15 MED ORDER — POTASSIUM CHLORIDE CRYS ER 20 MEQ PO TBCR
40.0000 meq | EXTENDED_RELEASE_TABLET | ORAL | Status: AC
  Administered 2022-04-15 (×2): 40 meq via ORAL
  Filled 2022-04-15 (×2): qty 2

## 2022-04-15 MED ORDER — ORAL CARE MOUTH RINSE: 15.0000 mL | OROMUCOSAL | Status: DC | PRN

## 2022-04-15 MED ORDER — ALBUMIN HUMAN 25 % IV SOLN
25.0000 g | Freq: Once | INTRAVENOUS | Status: AC
  Administered 2022-04-15: 25 g via INTRAVENOUS
  Filled 2022-04-15: qty 100

## 2022-04-15 MED ORDER — POTASSIUM CHLORIDE 10 MEQ/100ML IV SOLN
10.0000 meq | INTRAVENOUS | Status: DC
  Filled 2022-04-15 (×4): qty 100

## 2022-04-15 MED ORDER — METOLAZONE 2.5 MG PO TABS
2.5000 mg | ORAL_TABLET | Freq: Once | ORAL | Status: AC
  Administered 2022-04-15: 2.5 mg via ORAL
  Filled 2022-04-15: qty 1

## 2022-04-15 MED ORDER — POTASSIUM CHLORIDE 10 MEQ/100ML IV SOLN
10.0000 meq | INTRAVENOUS | Status: AC
  Administered 2022-04-15 (×2): 10 meq via INTRAVENOUS
  Filled 2022-04-15 (×2): qty 100

## 2022-04-15 NOTE — Progress Notes (Signed)
Md returned page, ordered to give albumin and potassium

## 2022-04-15 NOTE — Progress Notes (Addendum)
[facility]) Hospital Note  [name] is a current [facility] patient with a terminal diagnosis of hypertensive heart disease w/ heart failure.  Patient experienced worsening respiratory distress at her facility, [place], [facility] RN evaluated and decided to have her evaluated in the ED.  [name] was admitted to [facility] 10/11 for acute on chronic respiratory failure.  Per ACC MD, Dr. Cherie Ouch, this is a related hospice admission.  Patient had desaturated 75-80% on 7L HFNC.  Initially placed on CPAP but patient was unable to tolerate, switched to non-rebreather, then to heated HFNC. Visited [name] at the bedside.  Patient currently on heated (87.8) HFNC 50L/min FiO@ 50%. Patient states her breathing does not feel more labored than usual.  She did admit that she is "exhausted."  RN assessment noted crackles in her lungs.  CXR ordered results suggesting interstitial pulmonary edema.  Pt is complaining of right arm pain, received acetaminophen.    Spoke with patient again about transferring to [facility] for symptom management, to allow for a calmer environment while managing symptoms and awaiting LTC placement.    Patient is inpatient appropriate while managing shortness of breath and oxygen dependency necessitating higher level of monitoring.  Patient currently on heated HFNC.  Electrolyte replacement via IV.  Albumin IV.   VS: 97.9 oral  96/77 HR 59 RR 22 95% Heated HFNC temp 87.8 50 L/min FiO2 50  I&O: Cumulative -5050 mL (no input documented)   Abnormal Labs:  K+ 2.9 CO2 45 BUN 28 Cr 0.92   Diagnostics:  10/24 CXR suggest interstitial pulmonary edema. 10/16 CXR: increased bilateral interstitial opacities, likely due to worsening pulmonary edema.  IV/PRN Medications:  No PRN medications Diuretics changed to PO yesterday 10/22 and additional metolazone added 10/23.   K+ replacement IVP X 2 Albumin 25g IV X1   Problems List:  - Acute on Chronic  Respiratory Failure/ COPD/ Pleural Effusion: baseline O2 requirement is 4L/M, now requiring high flow nasal cannula at 6L/M.  CTchest Neg for PE.  S/P thoracentesis yeilding 535mL. Recent pulmonary note stated increasing basline O2 to 6L/M.  Continue steroids, duonebs, O2.  Diuretics switched to PO today.  Metolazone 2.5 mg added 10/23.   Patient on heated HFNC, O2 sats goal >88%.   - AKI: resolved. Monitor function daily. - A Fib: continue home meds. - Failure to Thrive/ Severe debility: Recent OT note states patient is a minimal assist to recliner, but patient expresses increased shortness of  breath.    Goals of Care: [name] wishes to return to LTC once medically cleared and location obtained as she cannot return to her ALF now that she is requiring a higher level of care.  (hospital TOC working on disposition, beds have been offered, awaiting patient/family selection.  [facility] will continue to follow.)   Anticipated discharge to LTC w/ [facility] hospice following.    Report exchanged with TOC, Sarah.    Thank you for the opportunity to participate in this patient's care.    Kenna Gilbert BSN, RN  [facility] Liaison  [phone] Epic/Amion

## 2022-04-15 NOTE — Progress Notes (Signed)
Made respiratory therapist BAMBI aware patient sat 75-80 on HFNC. Ordered to keep above 88%. Per RT place patient on CPAP, unfortunately patient is unable to tolerate. RT then stated to put on NRB, will be on floor to assess patient shortly

## 2022-04-15 NOTE — Progress Notes (Signed)
       CROSS COVER NOTE  NAME: Jaedin Trumbo MRN: [id number] DOB : 1941/01/25 ATTENDING PHYSICIAN: Sharen Hones, MD    Date of Service   04/15/2022   HPI/Events of Note   Rapid Response called for patient desaturation into high 70s while sleep, she was on HFNC at 6L. M(r)s Mikels has a history of OSA and has required HS CPAP this admission. At bedside patient has been placed back on CPAP and O2 saturation is >95%. She is not dyspneic and no adventitious lung sounds noted.  Interventions   Assessment/Plan:  Continue qHS CPAP while sleep CXR Give AM Toresemide now      This document was prepared using Dragon voice recognition software and may include unintentional dictation errors.  Neomia Glass DNP, MBA, FNP-BC Nurse Practitioner [facility] Pager [phone]

## 2022-04-15 NOTE — Progress Notes (Signed)
OT Cancellation Note  Patient Details Name: [name] MRN: [id number] DOB: December 06, 1940   Cancelled Treatment:    Reason Eval/Treat Not Completed: Medical issues which prohibited therapy. Chart reviewed. Pt noted with rapid response this morning, now on 50L HFNC. Was on 7L previous date. Will hold OT at this time and re-attempt at later date/time once pt more medically stable for exertional activity.   Ardeth Perfect., MPH, MS, OTR/L ascom [phone] 04/15/22, 12:44 PM

## 2022-04-15 NOTE — Plan of Care (Signed)
  Problem: Health Behavior/Discharge Planning: Goal: Ability to manage health-related needs will improve Outcome: Progressing   Problem: Clinical Measurements: Goal: Respiratory complications will improve Outcome: Progressing   Problem: Clinical Measurements: Goal: Cardiovascular complication will be avoided Outcome: Progressing   Problem: Elimination: Goal: Will not experience complications related to bowel motility Outcome: Progressing   Problem: Elimination: Goal: Will not experience complications related to urinary retention Outcome: Progressing   Problem: Pain Managment: Goal: General experience of comfort will improve Outcome: Progressing   Problem: Safety: Goal: Ability to remain free from injury will improve Outcome: Progressing

## 2022-04-15 NOTE — Progress Notes (Addendum)
Progress Note   Patient: [name] ASN:[id number] DOB: 1940/09/04 DOA: 04/02/2022     13 DOS: the patient was seen and examined on 04/15/2022   Brief hospital course: [name] is a 81 y.o. female with medical history significant of severe COPD c/b chronic hypoxic and hypercapnic respiratory failure on 4 L [place] O2, severe pulmonary hypertension, systolic CHF, atrial fibrillation on Eliquis, peripheral artery disease, NSCLC s/p XRT/chemotherapy in remission, OSA not on CPAP, who presents to the ED with shortness of breath. She eventually required 8 L oxygen to maintain her oxygen saturation. She is treated with diuretics for CHF exacerbation. She also had a right-sided thoracentesis, removing 500 mL of fluids on 10/12. She is continued on IV Lasix.   Assessment and Plan: Acute on chronic respiratory failure with hypoxemia and hypercapnia secondary to acute congestive heart failure, COPD and severe pulm hypertension. Acute on chronic diastolic congestive heart failure. Right pleural effusion secondary to congestive heart failure. Elevated troponin secondary to demand ischemia. Recent echocardiogram performed in 11/2021 showed ejection fraction 60 to 65% with severe pulm hypertension. Patient is status post right side thoracentesis. Patient has been diuresing really well on with torsemide, Aldactone and metolazone x2 days.  But she had a worsening short of breath last night, chest x-ray still showed significant vascular congestion.  She is currently on heated high flow.  At this point, I will give another dose of metolazone today, her blood pressure still holding, renal function still okay.  I will also check a BNP to see if she is still elevated.  If BNP not elevated, may consider other possibilities, she had a CT scan of the chest on 10/11, may consider repeat it. Patient condition is very sensitive to volume status due to severe pulmonary hypertension.  Has poor prognosis.      Severe COPD Appears stable, no bronchospasm.   Severe pulm hypertension. Permanent atrial fibrillation with RVR. Poor prognosis due to severe pulm hypertension.  Continue current treatment.   Acute kidney injury. Hypokalemia. Improved.   Failure to thrive. Severe debility. Followed by hospice.  Assisted-living facility would not accept patient back, looking for long-term care with hospice care.        Subjective:  Patient had a worsening short of breath last night, was placed on heated high flow.  She has massive amount of urine output after 5 L a day. No abdominal pain or nausea vomiting.  Physical Exam: Vitals:   04/15/22 1035 04/15/22 1151 04/15/22 1155 04/15/22 1340  BP:  117/71  (!) 92/53  Pulse: (!) 109 (!) 104 91 98  Resp: (!) 22 (!) 30 (!) 21 (!) 23  Temp:  98.2 F (36.8 C)    TempSrc:      SpO2: 96% 96% 96% 95%  Weight:      Height:       General exam: Appears calm and comfortable  Respiratory system: Decreased breathing sounds. Respiratory effort normal. Cardiovascular system: Irregular. No JVD, murmurs, rubs, gallops or clicks. No pedal edema. Gastrointestinal system: Abdomen is nondistended, soft and nontender. No organomegaly or masses felt. Normal bowel sounds heard. Central nervous system: Alert and oriented. No focal neurological deficits. Extremities: 2+ leg edema Skin: No rashes, lesions or ulcers Psychiatry: Mood & affect appropriate.   Data Reviewed:  Reviewed chest x-ray images compared to previous study. Reviewed all lab results.  Family Communication: daughter updated  Disposition: Status is: Inpatient Remains inpatient appropriate because: severity of disease  Planned Discharge Destination:  long term care facility with hospice    Time spent: 50 minutes  Author: Sharen Hones, MD 04/15/2022 3:43 PM  For on call review [email or web address].

## 2022-04-15 NOTE — Progress Notes (Signed)
PT Cancellation Note  Patient Details Name: [name] MRN: [id number] DOB: 28-Feb-1941   Cancelled Treatment:     Poor tolerance for activity this date due to increased O2 requirements consisting of Heated HFNC 50L/min FiO@ 50%. Will re-attempt next available time/date per pt tolerance.    Josie Dixon 04/15/2022, 12:40 PM

## 2022-04-15 NOTE — Progress Notes (Signed)
Made DR. ZHANG aware of CXR resulted. Crackles in bases, pressure running soft sbp 90-105

## 2022-04-15 NOTE — Progress Notes (Signed)
   04/15/22 0726  Assess: MEWS Score  Temp 97.9 F (36.6 C)  BP 96/77  MAP (mmHg) 83  Pulse Rate (!) 59  ECG Heart Rate 99  Resp (!) 21  SpO2 (!) 81 %  O2 Device CPAP  O2 Flow Rate (L/min) 6 L/min  Assess: MEWS Score  MEWS Temp 0  MEWS Systolic 1  MEWS Pulse 0  MEWS RR 1  MEWS LOC 0  MEWS Score 2  MEWS Score Color Yellow  Assess: if the MEWS score is Yellow or Red  Were vital signs taken at a resting state? Yes  Focused Assessment No change from prior assessment  Escalate  MEWS: Escalate Yellow: discuss with charge nurse/RN and consider discussing with provider and RRT  Notify: Charge Nurse/RN  Name of Charge Nurse/RN Notified t  Date Charge Nurse/RN Notified 04/15/22  Time Charge Nurse/RN Notified 0801  Document  Patient Outcome Stabilized after interventions  Assess: SIRS CRITERIA  SIRS Temperature  0  SIRS Pulse 1  SIRS Respirations  1  SIRS WBC 1  SIRS Score Sum  3   Patient placed on NRB, per RT request, patient tolerating, current sat 100%

## 2022-04-15 NOTE — Significant Event (Signed)
Rapid Response Event Note   Reason for Call :  Desalting to the 70s at rest   Initial Focused Assessment:  Patient resting in bed with NRB on. Patients vitals are stable on the monitor O2 100%. Patient is alert and oriented. Patient able to have conversation without struggle  and patient expressed her breathing does not feel  labored or different then usual.  Respirtarory at bedside placed paient back on cpap and weaned her back down to her baseline 6 liters. Patient Sating 94 %. Patient  sounds clear In upper lobes and diminished in lower lobes. Katie foust NP at bed side.     Interventions:  - Placed back on CPAP at home settings -NP instructed primary nurse to give 8am dose of torsemide early.  - Chest Xray ordered  Plan of Care:  - Maintain work of breathing and keep airway clear.   MD Notified: Neomia Glass NP Call Time: [place] [id number]  Gonzella Lex, RN

## 2022-04-15 NOTE — TOC Progression Note (Signed)
Transition of Care [facility]) - Progression Note    Patient Details  Name: [name] MRN: [id number] Date of Birth: 15-May-1941  Transition of Care [facility]) CM/SW [place], [place] Phone Number: 04/15/2022, 11:41 AM  Clinical Narrative:      [facility] considering patient however patient's O2 needs have gone back up to 50L HFNC, patient is unable to be accepted to snf until weaned down.   TOC will continue to follow.       Expected Discharge Plan and Services                                                 Social Determinants of Health (SDOH) Interventions    Readmission Risk Interventions     No data to display

## 2022-04-15 NOTE — Progress Notes (Signed)
Patient SPO2 was in the 70's with 6 L HFNC . Patient is not in distress denies SOB, lung diminish. Alert and oriented. Switched patient to non re breather which brought her SPO2 to 100% . RRRN and RT at beside. Patient now on CPAP with 6 L SPO2 in 90's. Morton Amy NP also arrived to bedside. Gave her 0800 40mg  torsemide per NP order. STAT CXR ordered. Will give report to day RN

## 2022-04-16 DIAGNOSIS — J9621 Acute and chronic respiratory failure with hypoxia: Secondary | ICD-10-CM | POA: Diagnosis not present

## 2022-04-16 DIAGNOSIS — N179 Acute kidney failure, unspecified: Secondary | ICD-10-CM | POA: Diagnosis not present

## 2022-04-16 DIAGNOSIS — D692 Other nonthrombocytopenic purpura: Secondary | ICD-10-CM | POA: Diagnosis not present

## 2022-04-16 DIAGNOSIS — D696 Thrombocytopenia, unspecified: Secondary | ICD-10-CM

## 2022-04-16 DIAGNOSIS — J90 Pleural effusion, not elsewhere classified: Secondary | ICD-10-CM | POA: Diagnosis not present

## 2022-04-16 DIAGNOSIS — I4892 Unspecified atrial flutter: Secondary | ICD-10-CM | POA: Diagnosis not present

## 2022-04-16 DIAGNOSIS — G4733 Obstructive sleep apnea (adult) (pediatric): Secondary | ICD-10-CM

## 2022-04-16 DIAGNOSIS — Z7189 Other specified counseling: Secondary | ICD-10-CM | POA: Diagnosis not present

## 2022-04-16 DIAGNOSIS — J9622 Acute and chronic respiratory failure with hypercapnia: Secondary | ICD-10-CM | POA: Diagnosis not present

## 2022-04-16 DIAGNOSIS — E039 Hypothyroidism, unspecified: Secondary | ICD-10-CM | POA: Diagnosis not present

## 2022-04-16 LAB — BASIC METABOLIC PANEL
Anion gap: 11 (ref 5–15)
BUN: 32 mg/dL — ABNORMAL HIGH (ref 8–23)
CO2: 44 mmol/L — ABNORMAL HIGH (ref 22–32)
Calcium: 9.7 mg/dL (ref 8.9–10.3)
Chloride: 86 mmol/L — ABNORMAL LOW (ref 98–111)
Creatinine, Ser: 1.19 mg/dL — ABNORMAL HIGH (ref 0.44–1.00)
GFR, Estimated: 45 mL/min — ABNORMAL LOW (ref >60)
Glucose, Bld: 109 mg/dL — ABNORMAL HIGH (ref 70–99)
Potassium: 4.1 mmol/L (ref 3.5–5.1)
Sodium: 141 mmol/L (ref 135–145)

## 2022-04-16 LAB — CBC
HCT: 49.7 % — ABNORMAL HIGH (ref 36.0–46.0)
Hemoglobin: 16 g/dL — ABNORMAL HIGH (ref 12.0–15.0)
MCH: 34.8 pg — ABNORMAL HIGH (ref 26.0–34.0)
MCHC: 32.2 g/dL (ref 30.0–36.0)
MCV: 108 fL — ABNORMAL HIGH (ref 80.0–100.0)
Platelets: 106 10*3/uL — ABNORMAL LOW (ref 150–400)
RBC: 4.6 MIL/uL (ref 3.87–5.11)
RDW: 13.8 % (ref 11.5–15.5)
WBC: 7.3 10*3/uL (ref 4.0–10.5)
nRBC: 0 % (ref 0.0–0.2)

## 2022-04-16 LAB — BRAIN NATRIURETIC PEPTIDE: B Natriuretic Peptide: 326 pg/mL — ABNORMAL HIGH (ref 0.0–100.0)

## 2022-04-16 LAB — MAGNESIUM: Magnesium: 2.1 mg/dL (ref 1.7–2.4)

## 2022-04-16 NOTE — Progress Notes (Signed)
[facility] rm [address] Manufacturing engineer [facility]) Hospitalized Hospice Patient   Ms. Sipos is a current [facility] patient with a terminal diagnosis of hypertensive heart disease with heart failure. Patient began experiencing worsening respiratory distress at her facility and was evaluated by [facility] nurse. Decision was made in part due to her code status as well as per request of patient to have her evaluated in ED. She is admitted with a diagnosis of acute on chronic respiratory failure. Per Dr. Cherie Ouch with [facility] this is a related hospice admission.   Ms. Pelto is still requiring oxygen at 50L nasal cannula with an FiO2 of 48%. Report exchanged with palliative practitioner. Family is weighing options of the possibility of moving patient to the hospice IPU and whether or not patient might be able to make this trip. Family has requested that she not be moved today.  Patient remains inpatient appropriate having received IV Albumin and Potassium yesterday along with continued high oxygen demands.    V/S: 97.8/90/22    160/80     92% on 50L Heated HFNC  I&O: 120/1800  Labs: Chloride 86, CO2 44, BUN 32, Creatinine 1.19, GFR 45, BNP 326, Hgb 16, Hct 49.7, Platelets 106  Diagnostics: None new   IV/PRN: Morphine 7.5mg  PO x1, IV Potassium and IV Albumin completed yesterday   Problem List: Acute on chronic respiratory failure with hypoxemia and hypercapnia secondary to acute congestive heart failure, COPD and severe pulm hypertension. Acute on chronic diastolic congestive heart failure. Right pleural effusion secondary to congestive heart failure. Elevated troponin secondary to demand ischemia. Recent echocardiogram performed in 11/2021 showed ejection fraction 60 to 65% with severe pulm hypertension. Patient is status post right side thoracentesis. Patient has been diuresing really well on with torsemide, Aldactone and metolazone x2 days.  But she had a worsening short of breath last night, chest x-ray still  showed significant vascular congestion.  She is currently on heated high flow.  At this point, I will give another dose of metolazone today, her blood pressure still holding, renal function still okay.  I will also check a BNP to see if she is still elevated.  If BNP not elevated, may consider other possibilities, she had a CT scan of the chest on 10/11, may consider repeat it. Patient condition is very sensitive to volume status due to severe pulmonary hypertension.  Has poor prognosis.    D/C planning: Ongoing, possibly d/c to [place] pending stability for transport Family: Talked with daughters by phone, discussed option of transfer to [facility] and that patient had verbalized interest in this however they report they do not want her moved today and likely not tomorrow.  IDT: Updated GOC: DNR   Thank you, Jhonnie Garner, BSN, RN, [facility] hospital liaison [phone] or Epic chat

## 2022-04-16 NOTE — TOC Progression Note (Signed)
Transition of Care [facility]) - Progression Note    Patient Details  Name: [name] MRN: [id number] Date of Birth: 06-23-1941  Transition of Care [facility]) CM/SW [place], [place] Phone Number: 04/16/2022, 1:47 PM  Clinical Narrative:     CSW notes dc plan is now for patient to go to [facility] home pending bed availability.        Expected Discharge Plan and Services                                                 Social Determinants of Health (SDOH) Interventions    Readmission Risk Interventions     No data to display

## 2022-04-16 NOTE — Assessment & Plan Note (Addendum)
Continue torsemide, Aldactone and metoprolol

## 2022-04-16 NOTE — Assessment & Plan Note (Addendum)
Replaced. °

## 2022-04-16 NOTE — Assessment & Plan Note (Signed)
Continue Synthroid °

## 2022-04-16 NOTE — Progress Notes (Addendum)
Daily Progress Note   Patient Name: [name]       Date: 04/16/2022 DOB: [birth year]  Age: 81 y.o. MRN#: [id number] Attending Physician: Loletha Grayer, MD Primary Care Physician: Jinny Sanders, MD Admit Date: 04/02/2022  Reason for Consultation/Follow-up: Establishing goals of care  Subjective: Notes and labs reviewed.  Into see patient.  Patient is on high flow cannula at around 50 L and 50%.  No family at bedside.  Patient is currently followed by outpatient hospice.   We discussed her hospitalization up to this point.  We discussed her diagnoses, prognosis, GOC, EOL wishes disposition and options.  We discussed how medications affect our body and how medications and bodily systems affect each other.  Created space and opportunity for patient  to explore thoughts and feelings regarding current medical information.   A detailed discussion was had today regarding advanced directives.  Concepts specific to code status, artifical feeding and hydration, IV antibiotics and rehospitalization were discussed.  The difference between an aggressive medical intervention path and a comfort care path was discussed.  Values and goals of care important to patient and family were attempted to be elicited.  Discussed limitations of medical interventions to prolong quality of life in some situations and discussed the concept of human mortality.  Patient states she just wants to go back to her ALF. She states she is not interested in rehab, and states that she is too physically weak and short of breath to go to rehab at this time. She is not interested in other facilities to live in.   She understands the overall picture and states that she would love to stay on this earth longer for her great  grandchild who is 32 years old, but does not want to return to the hospital and does not want to continue suffering.  We discussed video and audio recordings she can make, and things that she can do to create memento's for her great grandchild.  Offered to speak to patient's daughters.  We attempted to call Caren Griffins together on her cell phone, but was unable due to poor cell phone reception.  I stepped out to call Caren Griffins on desk phone.  Both daughters were together on speaker phone upon calling.  Daughter is and I discussed our conversation, and daughters are amenable to a  plan of hospice facility placement and comfort focused care.  All questions answered regarding hospice facility transition and symptom management needs.  Discussed limited prognosis given her oxygen requirements.  TOC and primary MD made aware.  Length of Stay: 14  Current Medications: Scheduled Meds:   apixaban  2.5 mg Oral BID   cholecalciferol  1,000 Units Oral Daily   citalopram  20 mg Oral Daily   digoxin  0.0625 mg Oral Daily   feeding supplement  1 Container Oral BID BM   gabapentin  100 mg Oral TID   ipratropium-albuterol  3 mL Nebulization BID   levothyroxine  88 mcg Oral Q0600   melatonin  5 mg Oral QHS   metoprolol tartrate  25 mg Oral BID   multivitamin with minerals  1 tablet Oral Daily   pantoprazole  40 mg Oral Daily   spironolactone  25 mg Oral Daily   torsemide  40 mg Oral BID   vitamin B-12  100 mcg Oral Daily    Continuous Infusions:   PRN Meds: acetaminophen, albuterol, morphine, mouth rinse, polyethylene glycol  Physical Exam Pulmonary:     Comments: On high flow cannula Neurological:     Mental Status: She is alert.             Vital Signs: BP (!) 160/80 (BP Location: Left Arm)   Pulse (!) 43   Temp 98.6 F (37 C) (Oral)   Resp 19   Ht 5' (1.524 m)   Wt 106.1 kg   SpO2 98%   BMI 45.68 kg/m  SpO2: SpO2: 98 % O2 Device: O2 Device: High Flow Nasal Cannula O2 Flow Rate: O2 Flow  Rate (L/min): 50 L/min  Intake/output summary:  Intake/Output Summary (Last 24 hours) at 04/16/2022 1225 Last data filed at 04/16/2022 1210 Gross per 24 hour  Intake 960 ml  Output 1601 ml  Net -641 ml   LBM: Last BM Date : 04/15/22 Baseline Weight: Weight: 90.7 kg Most recent weight: Weight: 106.1 kg     Patient Active Problem List   Diagnosis Date Noted   Chronic atrial fibrillation with RVR ([place]) 04/09/2022   Failure to thrive in adult 04/09/2022   COPD (chronic obstructive pulmonary disease) ([place]) 04/09/2022   Severe pulmonary hypertension ([place]) 04/02/2022   AKI (acute kidney injury) ([place]) 04/02/2022   Hyperkalemia 04/02/2022   Pleural effusion on right 04/02/2022   Demand ischemia 04/02/2022   Aneurysm of thoracic aorta ([place]) 01/12/2022   Vaginal bleeding 12/23/2021   Thrombocytopenia ([place]) 12/04/2021   Permanent atrial fibrillation ([place])    Goals of care, counseling/discussion 12/03/2021   Acute on chronic diastolic CHF (congestive heart failure) ([place]) 12/02/2021   Chronic low back pain 12/02/2021   Obesity (BMI 30-39.9) 12/02/2021   Anxiety and depression    Osteoarthritis of left shoulder 08/30/2021   Falls 07/15/2021   Acute on chronic respiratory failure with hypoxia and hypercapnia (HCC) 05/04/2021   Frequent UTI    Anticoagulant long-term use    Physical deconditioning 06/16/2019   Hypokalemia    Hyperlipidemia    Chronic respiratory failure with hypoxia ([place]) 12/03/2014   Multiple thyroid nodules 10/17/2012   AAA (abdominal aortic aneurysm) ([place]) 05/11/2012   Bradycardia 01/23/2012   Hypothyroid 01/21/2012   HTN (hypertension) 01/21/2012   Obstructive sleep apnea 10/16/2008   History of DVT (deep vein thrombosis) 08/19/2008   NEOPLASM, MALIGNANT, LUNG, HX OF 64/33/2951   Diastolic heart failure, NYHA class 2 ([place]) 08/03/2008   Hyperbilirubinemia 2004  Palliative Care Assessment & Plan   Recommendations/Plan: Patient is already followed by  outpatient hospice. Patient is amenable to hospice facility placement in a shift to comfort focused care.  Per notes hospice facility placement was discussed by hospice representative with patient yesterday.  Code Status:    Code Status Orders  (From admission, onward)           Start     Ordered   04/08/22 1135  Do not attempt resuscitation (DNR)  Continuous       Question Answer Comment  In the event of cardiac or respiratory ARREST Do not call a "code blue"   In the event of cardiac or respiratory ARREST Do not perform Intubation, CPR, defibrillation or ACLS   In the event of cardiac or respiratory ARREST Use medication by any route, position, wound care, and other measures to relive pain and suffering. May use oxygen, suction and manual treatment of airway obstruction as needed for comfort.   Comments MOST in chart      04/08/22 1134           Code Status History     Date Active Date Inactive Code Status Order ID Comments User Context   04/02/2022 1655 04/08/2022 1134 Partial Code [id number]  Jose Persia, MD ED   12/02/2021 1526 12/09/2021 2146 Full Code [id number]  Collier Bullock, MD ED   07/15/2021 0820 07/16/2021 2032 Full Code [id number]  Collier Bullock, MD ED   06/17/2021 2055 06/26/2021 0350 DNR [id number]  Para Skeans, MD ED   05/04/2021 2218 05/09/2021 2227 Full Code [id number]  Athena Masse, MD ED   09/15/2016 2045 09/28/2016 1707 Full Code [id number]  [place], Peter M, MD Inpatient       Care plan was discussed with attending; and [facility] and hospice via epic chat.   Thank you for allowing the Palliative Medicine Team to assist in the care of this patient.    Asencion Gowda, NP  Please contact Palliative Medicine Team phone at [phone] for questions and concerns.

## 2022-04-16 NOTE — Progress Notes (Signed)
Progress Note   Patient: [name] EHM:[id number] DOB: 11/24/1940 DOA: 04/02/2022     14 DOS: the patient was seen and examined on 04/16/2022   Brief hospital course: [name] is a 81 y.o. female with medical history significant of severe COPD c/b chronic hypoxic and hypercapnic respiratory failure on 4 L [place] O2, severe pulmonary hypertension, systolic CHF, atrial fibrillation on Eliquis, peripheral artery disease, NSCLC s/p XRT/chemotherapy in remission, OSA not on CPAP, who presents to the ED with shortness of breath. She eventually required 8 L oxygen to maintain her oxygen saturation. She is treated with diuretics for CHF exacerbation. She also had a right-sided thoracentesis, removing 500 mL of fluids on 10/12. She is continued on IV Lasix.   Assessment and Plan: * Acute on chronic respiratory failure with hypoxia and hypercapnia (HCC) Recent echocardiogram shows a normal EF with severe pulmonary hypertension. Patient had right thoracentesis. Diuresing with torsemide and Aldactone. Currently on heated high flow nasal cannula 50% FiO2 and 50 L flow.  Acute on chronic diastolic CHF (congestive heart failure) (HCC) Continue torsemide and metoprolol  Pleural effusion, right Status postthoracentesis on 10/12 removing 500 mL of fluid  AKI (acute kidney injury) ([place]) Creatinine elevated at 1.55 on admission.  Creatinine 0.92 on 04/15/2022 and up to 1.19 on 04/16/2022.  Hypothyroid Continue Synthroid  Severe pulmonary hypertension (HCC) Secondary to COPD and chronic hypoxic respiratory failure.  Permanent atrial fibrillation (HCC) - Continue home digoxin and metoprolol.  Eliquis for anticoagulation.  Thrombocytopenia ([place]) Chronically low  Failure to thrive in adult Continue to monitor  Hypokalemia Replaced        Subjective: Patient feels okay.  Still on 50% high flow nasal cannula.  Some shortness of breath.  Physical Exam: Vitals:   04/16/22 0811  04/16/22 1000 04/16/22 1206 04/16/22 1518  BP: 100/67  (!) 160/80 97/86  Pulse: (!) 45 89 (!) 43 96  Resp: 18  19 (!) 22  Temp: 98 F (36.7 C)  98.6 F (37 C) 97.8 F (36.6 C)  TempSrc: Oral  Oral Oral  SpO2: 93%  98% 92%  Weight:      Height:       Physical Exam HENT:     Head: Normocephalic.     Mouth/Throat:     Pharynx: No oropharyngeal exudate.  Eyes:     General: Lids are normal.     Conjunctiva/sclera: Conjunctivae normal.  Cardiovascular:     Rate and Rhythm: Normal rate and regular rhythm.     Heart sounds: Normal heart sounds, S1 normal and S2 normal.  Pulmonary:     Breath sounds: Examination of the right-lower field reveals decreased breath sounds. Examination of the left-lower field reveals decreased breath sounds. Decreased breath sounds present. No wheezing, rhonchi or rales.  Abdominal:     Palpations: Abdomen is soft.     Tenderness: There is no abdominal tenderness.  Musculoskeletal:     Right lower leg: Swelling present.     Left lower leg: Swelling present.  Skin:    General: Skin is warm.     Findings: No rash.  Neurological:     Mental Status: She is alert and oriented to person, place, and time.     Data Reviewed: Creatinine 1.19, CO2 44, BNP 326, hemoglobin 16, platelet count 106  Family Communication: Spoke with patient's daughter on the phone  Disposition: Status is: Inpatient Remains inpatient appropriate because: Still on 50% heated high flow nasal cannula at 50 L flow.  Planned Discharge Destination: Potential hospice facility    Time spent: 27 minutes  Author: Loletha Grayer, MD 04/16/2022 4:15 PM  For on call review [email or web address].

## 2022-04-16 NOTE — Assessment & Plan Note (Signed)
Continue to monitor

## 2022-04-16 NOTE — Progress Notes (Signed)
Physical Therapy Treatment Patient Details Name: Danajah Birdsell MRN: [id number] DOB: 16-Dec-1940 Today's Date: 04/16/2022   History of Present Illness Alyssha Housh is a 81 y.o. female with medical history significant of severe COPD c/b chronic hypoxic and hypercapnic respiratory failure on 4 L [place] O2, severe pulmonary hypertension, systolic CHF, atrial fibrillation on Eliquis, peripheral artery disease, NSCLC s/p XRT/chemotherapy in remission, OSA not on CPAP, who presents to the ED with shortness of breath.    PT Comments    Pt remains on HFNC 50L/min FiO@ 50% and saturating at 100% at rest. Pt declined mobility this am stating she could not tolerate, therefore pt seen for B LE strengthening exercises and repositioning to facilitate skin integrity.  O2 sats down to 95% with basic strengthening exercises. Will continue to progress as pt can tolerate. Continue to recommend SNF once medically stable.    Recommendations for follow up therapy are one component of a multi-disciplinary discharge planning process, led by the attending physician.  Recommendations may be updated based on patient status, additional functional criteria and insurance authorization.  Follow Up Recommendations  Skilled nursing-short term rehab (<3 hours/day) Can patient physically be transported by private vehicle: No   Assistance Recommended at Discharge Frequent or constant Supervision/Assistance  Patient can return home with the following A lot of help with walking and/or transfers;Direct supervision/assist for medications management;Assist for transportation;Help with stairs or ramp for entrance;A lot of help with bathing/dressing/bathroom   Equipment Recommendations  None recommended by PT (TBD at next facility)    Recommendations for Other Services       Precautions / Restrictions Precautions Precautions: Fall Restrictions Weight Bearing Restrictions: No     Mobility  Bed Mobility                General bed mobility comments: Pt declined any mobility    Transfers                   General transfer comment: pt declined    Ambulation/Gait                   Stairs             Wheelchair Mobility    Modified Rankin (Stroke Patients Only)       Balance                                            Cognition Arousal/Alertness: Awake/alert Behavior During Therapy: WFL for tasks assessed/performed Overall Cognitive Status: Within Functional Limits for tasks assessed                                 General Comments: Pt is A and O x4. Pleasant and cooperative        Exercises General Exercises - Lower Extremity Ankle Circles/Pumps: AROM, Both, 15 reps, Supine Heel Slides: AROM, Strengthening, Both, 10 reps, Supine Hip ABduction/ADduction: AROM, Strengthening, Both, 10 reps, Supine Other Exercises Other Exercises: Role of PT in acute setting, d/c recs, LE therex and transfers to maintain LE strength and independence with functional mobility.    General Comments        Pertinent Vitals/Pain Pain Assessment Pain Assessment: No/denies pain    Home Living  Prior Function            PT Goals (current goals can now be found in the care plan section) Acute Rehab PT Goals Patient Stated Goal: feel better    Frequency    Min 2X/week      PT Plan      Co-evaluation              AM-PAC PT "6 Clicks" Mobility   Outcome Measure  Help needed turning from your back to your side while in a flat bed without using bedrails?: None Help needed moving from lying on your back to sitting on the side of a flat bed without using bedrails?: A Little Help needed moving to and from a bed to a chair (including a wheelchair)?: A Little Help needed standing up from a chair using your arms (e.g., wheelchair or bedside chair)?: A Little Help needed to walk in hospital room?: A  Little Help needed climbing 3-5 steps with a railing? : A Lot 6 Click Score: 18    End of Session Equipment Utilized During Treatment: Oxygen Activity Tolerance: Treatment limited secondary to medical complications (Comment) (Pt desated from 100% to 95% with LE exercises) Patient left: in bed;with call bell/phone within reach;with bed alarm set Nurse Communication: Other (comment) (Inability to tolerate mobility) PT Visit Diagnosis: Other abnormalities of gait and mobility (R26.89);Muscle weakness (generalized) (M62.81)     Time: 0349-1791 PT Time Calculation (min) (ACUTE ONLY): 16 min  Charges:  $Therapeutic Exercise: 8-22 mins                    Mikel Cella, PTA    Josie Dixon 04/16/2022, 11:18 AM

## 2022-04-16 NOTE — Progress Notes (Signed)
Found patient on room air with saturation in the 60"s. [place] audible. Placed patient back on HFNC. O2 saturation increased to 90's. Patient not distressed

## 2022-04-16 NOTE — Plan of Care (Signed)

## 2022-04-16 NOTE — Progress Notes (Addendum)
Pt HR sustaining at 120 to high 130's. BP at this time at 98/81 MAP 85. Metropolol 25 mg tablet scheduled administered at 205.  Pt asymptomatic at this time. NP Randol Kern made aware. Will continue to monitor.  Update 2250: NP Randol Kern instructed RN to check BP Manually. BP 100/80 MAP 73 manually and HR sustaining 106-120.  NP Randol Kern made aware. Will continue to monitor.  Update 2300: No new order place but NP Randol Kern instructed RN to notify if HR sustain above 120. Will continue to monitor.

## 2022-04-17 DIAGNOSIS — I5033 Acute on chronic diastolic (congestive) heart failure: Secondary | ICD-10-CM | POA: Diagnosis not present

## 2022-04-17 DIAGNOSIS — E876 Hypokalemia: Secondary | ICD-10-CM

## 2022-04-17 DIAGNOSIS — J9621 Acute and chronic respiratory failure with hypoxia: Secondary | ICD-10-CM | POA: Diagnosis not present

## 2022-04-17 DIAGNOSIS — N179 Acute kidney failure, unspecified: Secondary | ICD-10-CM | POA: Diagnosis not present

## 2022-04-17 DIAGNOSIS — J90 Pleural effusion, not elsewhere classified: Secondary | ICD-10-CM | POA: Diagnosis not present

## 2022-04-17 DIAGNOSIS — R627 Adult failure to thrive: Secondary | ICD-10-CM

## 2022-04-17 LAB — BASIC METABOLIC PANEL
Anion gap: 13 (ref 5–15)
BUN: 35 mg/dL — ABNORMAL HIGH (ref 8–23)
CO2: 41 mmol/L — ABNORMAL HIGH (ref 22–32)
Calcium: 9.2 mg/dL (ref 8.9–10.3)
Chloride: 82 mmol/L — ABNORMAL LOW (ref 98–111)
Creatinine, Ser: 1.07 mg/dL — ABNORMAL HIGH (ref 0.44–1.00)
GFR, Estimated: 52 mL/min — ABNORMAL LOW (ref >60)
Glucose, Bld: 111 mg/dL — ABNORMAL HIGH (ref 70–99)
Potassium: 3.4 mmol/L — ABNORMAL LOW (ref 3.5–5.1)
Sodium: 136 mmol/L (ref 135–145)

## 2022-04-17 MED ORDER — POTASSIUM CHLORIDE CRYS ER 20 MEQ PO TBCR
40.0000 meq | EXTENDED_RELEASE_TABLET | Freq: Once | ORAL | Status: AC
  Administered 2022-04-17: 40 meq via ORAL
  Filled 2022-04-17: qty 2

## 2022-04-17 NOTE — Progress Notes (Signed)
   04/17/22 0907  Assess: MEWS Score  Temp 98 F (36.7 C)  BP 93/71  MAP (mmHg) 80  Pulse Rate (!) 115  Resp 20  SpO2 93 %  Assess: MEWS Score  MEWS Temp 0  MEWS Systolic 1  MEWS Pulse 2  MEWS RR 0  MEWS LOC 0  MEWS Score 3  MEWS Score Color Yellow  Assess: if the MEWS score is Yellow or Red  Were vital signs taken at a resting state? Yes  Focused Assessment No change from prior assessment  Does the patient meet 2 or more of the SIRS criteria? No  Does the patient have a confirmed or suspected source of infection? No  Provider and Rapid Response Notified? Yes  MEWS guidelines implemented *See Row Information* Yes  Treat  MEWS Interventions Other (Comment) (none)  Pain Scale 0-10  Pain Score 0  Take Vital Signs  Increase Vital Sign Frequency  Yellow: Q 2hr X 2 then Q 4hr X 2, if remains yellow, continue Q 4hrs  Escalate  MEWS: Escalate Yellow: discuss with charge nurse/RN and consider discussing with provider and RRT  Notify: Charge Nurse/RN  Name of Charge Nurse/RN Notified Kerin Ransom, RN  Date Charge Nurse/RN Notified 04/17/22  Time Charge Nurse/RN Notified 0913  Notify: Provider  Provider Name/Title Dr. Leslye Peer  Date Provider Notified 04/17/22  Time Provider Notified [phone]  Method of Notification Rounds  Notification Reason Other (Comment) (Yellow mews)  Provider response No new orders  Date of Provider Response 04/17/22  Time of Provider Response 0905  Assess: SIRS CRITERIA  SIRS Temperature  0  SIRS Pulse 1  SIRS Respirations  0  SIRS WBC 1  SIRS Score Sum  2

## 2022-04-17 NOTE — Progress Notes (Signed)
[facility]) Hospital Note  [name] is a current [facility] patient with a terminal diagnosis of hypertensive heart disease w/ heart failure.  Patient experienced worsening respiratory distress at her facility, [facility] RN evaluated and decided to have her evaluated in the ED.  [name] was admitted to [facility] 10/11 for acute on chronic respiratory failure.  Per [facility] MD, Dr. Cherie Ouch, this is a related hospice admission.  Patient was asleep at time of visit.  Breakfast tray on side table, patient had eaten about 75% of meal.  O2 sats were 96% on heated HFNC at 50L/min.    Patient is inpatient appropriate while managing shortness of breath and oxygen dependency necessitating higher level of monitoring.  Patient currently on heated HFNC.    VS: 98 oral  93/71 HR 115 RR 20 93% Heated HFNC temp 87.8 50 L/min FiO2 50  I&O: Cumulative -2560 mL (240 mL PO input documented)   Abnormal Labs:  K+ 3.4 CO2 41 BUN 35 Cr 1.07 GFR 52  Diagnostics:  10/24 CXR suggest interstitial pulmonary edema. 10/16 CXR: increased bilateral interstitial opacities, likely due to worsening pulmonary edema.  IV/PRN Medications:  No PRN medications Diuretics changed to PO 10/22 and additional metolazone added 10/23.    Problems List:  - Acute on Chronic Respiratory Failure/ COPD/ Pleural Effusion: baseline O2 requirement is 4L/M, now requiring high flow nasal cannula at 6L/M.  CTchest Neg for PE.  S/P thoracentesis yeilding 540mL. Recent pulmonary note stated increasing basline O2 to 6L/M.  Continue steroids, duonebs, O2.  Diuretics switched to PO 10/22.  Metolazone 2.5 mg added 10/23.   Patient on heated HFNC, O2 sats goal >88% 10/24.   - AKI: Creatinine elevated 10/25, 10/26.   - A Fib: continue home meds. - Failure to Thrive/ Severe debility: per palliative medicine note patient stated she did not have the strength to rehab, too weak and short of breath.    Goals of Care: Ms.  [name] wishes to return to ALF but is unable to due to level of care she is currently requiring. Hospital liaison team have spoke with patient and daughters about transferring care to [place].  [facility] will continue to follow.)   Anticipated discharge pending facility.  Report exchanged with hospital TOC.    Thank you for the opportunity to participate in this patient's care.    Kenna Gilbert BSN, RN  [facility] Liaison  [phone] Epic/Amion

## 2022-04-17 NOTE — Plan of Care (Signed)
  Problem: Clinical Measurements: Goal: Ability to maintain clinical measurements within normal limits will improve Outcome: Progressing   Problem: Clinical Measurements: Goal: Respiratory complications will improve Outcome: Progressing   Problem: Clinical Measurements: Goal: Cardiovascular complication will be avoided Outcome: Progressing   Problem: Pain Managment: Goal: General experience of comfort will improve Outcome: Progressing   Problem: Safety: Goal: Ability to remain free from injury will improve Outcome: Progressing

## 2022-04-17 NOTE — Progress Notes (Signed)
Progress Note   Patient: [name] RXV:[id number] DOB: 1940-12-07 DOA: 04/02/2022     15 DOS: the patient was seen and examined on 04/17/2022   Brief hospital course: [name] is a 81 y.o. female with medical history significant of severe COPD c/b chronic hypoxic and hypercapnic respiratory failure on 4 L [place] O2, severe pulmonary hypertension, systolic CHF, atrial fibrillation on Eliquis, peripheral artery disease, NSCLC s/p XRT/chemotherapy in remission, OSA not on CPAP, who presents to the ED with shortness of breath. She eventually required 8 L oxygen to maintain her oxygen saturation.  Required to go on heated high flow nasal cannula. She is treated with diuretics for CHF exacerbation. She also had a right-sided thoracentesis, removing 500 mL of fluids on 10/12.  Now on oral diuretics.  10/26.  Change the goal of oxygenation for pulse ox of 88% or above.  We are able to titrate down on the 50% heated high flow nasal cannula at 50 L flow down to 40% FiO2 on 20 L flow.  Continue to taper oxygen.  Assessment and Plan: * Acute on chronic respiratory failure with hypoxia and hypercapnia (HCC) Recent echocardiogram shows a normal EF with severe pulmonary hypertension. Patient had right thoracentesis. Diuresing with torsemide and Aldactone. Asked nursing staff to try to taper down the heated high flow nasal cannula.  Looks like this afternoon down to 40% FiO2 and 20 L flow.  She chronically wears 6 L at home.  Hopefully we can switch over tomorrow.  I am okay with a pulse ox of 88%.  Acute on chronic diastolic CHF (congestive heart failure) (HCC) Continue torsemide, Aldactone and metoprolol  Pleural effusion, right Status postthoracentesis on 10/12 removing 500 mL of fluid  AKI (acute kidney injury) ([place]) Creatinine elevated at 1.55 on admission.  Creatinine 0.92 on 04/15/2022 and up to 1.19 on 04/16/2022.  Creatinine down to 1.07 on 04/17/2022.  Hypothyroid Continue  Synthroid  Severe pulmonary hypertension (HCC) Secondary to COPD and chronic hypoxic respiratory failure.  Permanent atrial fibrillation (HCC) - Continue home digoxin and metoprolol.  Eliquis for anticoagulation.  Thrombocytopenia ([place]) Chronically low  Failure to thrive in adult Continue to monitor  Hypokalemia Replace orally today.        Subjective: Patient feeling okay.  Short of breath if doing any activity.  Admitted with acute on chronic respiratory failure with hypoxia and COPD and pleural effusion.  Physical Exam: Vitals:   04/17/22 0836 04/17/22 0907 04/17/22 1050 04/17/22 1300  BP:  93/71 104/71 109/73  Pulse:  (!) 115    Resp:  20 20 20   Temp:  98 F (36.7 C) 98.5 F (36.9 C)   TempSrc:   Oral   SpO2: 97% 93% 96% 90%  Weight:      Height:       Physical Exam HENT:     Head: Normocephalic.     Mouth/Throat:     Pharynx: No oropharyngeal exudate.  Eyes:     General: Lids are normal.     Conjunctiva/sclera: Conjunctivae normal.  Cardiovascular:     Rate and Rhythm: Normal rate and regular rhythm.     Heart sounds: Normal heart sounds, S1 normal and S2 normal.  Pulmonary:     Breath sounds: Examination of the right-lower field reveals decreased breath sounds. Examination of the left-lower field reveals decreased breath sounds. Decreased breath sounds present. No wheezing, rhonchi or rales.  Abdominal:     Palpations: Abdomen is soft.  Tenderness: There is no abdominal tenderness.  Musculoskeletal:     Right lower leg: Swelling present.     Left lower leg: Swelling present.  Skin:    General: Skin is warm.     Findings: No rash.  Neurological:     Mental Status: She is alert and oriented to person, place, and time.     Data Reviewed: Creatinine 1.07, potassium 3.4  Family Communication: Updated patient's daughter on the phone  Disposition: Status is: Inpatient Remains inpatient appropriate because: Trying to taper heated high flow nasal  cannula.  This afternoon down to 40% FiO2 and 20 L flow  Planned Discharge Destination: Hospice facility    Time spent: 28 minutes  Author: Loletha Grayer, MD 04/17/2022 2:48 PM  For on call review [email or web address].

## 2022-04-17 NOTE — Progress Notes (Signed)
Physical Therapy Discharge Patient Details Name: [name] MRN: [id number] DOB: 1940/08/05 Today's Date: 04/17/2022 Time:  -     Patient discharged from PT services secondary to medical decline and plan of care changing to a more comfort approach. Awaiting hospice home placement. Please re-consult if pt's care plan changes.    Julaine Fusi PTA 04/17/22, 12:25 PM

## 2022-04-17 NOTE — Progress Notes (Signed)
OT Cancellation Note  Patient Details Name: [name] MRN: [id number] DOB: February 09, 1941   Cancelled Treatment:    Reason Eval/Treat Not Completed: Other (comment). Per chart review, pt has decided on comfort care measures. OT to complete orders at this time.   Darleen Crocker, MS, OTR/L , CBIS ascom [phone]  04/17/22, 8:12 AM

## 2022-04-18 DIAGNOSIS — J90 Pleural effusion, not elsewhere classified: Secondary | ICD-10-CM | POA: Diagnosis not present

## 2022-04-18 DIAGNOSIS — I5033 Acute on chronic diastolic (congestive) heart failure: Secondary | ICD-10-CM | POA: Diagnosis not present

## 2022-04-18 DIAGNOSIS — N179 Acute kidney failure, unspecified: Secondary | ICD-10-CM | POA: Diagnosis not present

## 2022-04-18 DIAGNOSIS — J9621 Acute and chronic respiratory failure with hypoxia: Secondary | ICD-10-CM | POA: Diagnosis not present

## 2022-04-18 LAB — BASIC METABOLIC PANEL
Anion gap: 13 (ref 5–15)
BUN: 37 mg/dL — ABNORMAL HIGH (ref 8–23)
CO2: 39 mmol/L — ABNORMAL HIGH (ref 22–32)
Calcium: 9.2 mg/dL (ref 8.9–10.3)
Chloride: 83 mmol/L — ABNORMAL LOW (ref 98–111)
Creatinine, Ser: 1.11 mg/dL — ABNORMAL HIGH (ref 0.44–1.00)
GFR, Estimated: 49 mL/min — ABNORMAL LOW (ref >60)
Glucose, Bld: 122 mg/dL — ABNORMAL HIGH (ref 70–99)
Potassium: 3.7 mmol/L (ref 3.5–5.1)
Sodium: 135 mmol/L (ref 135–145)

## 2022-04-18 LAB — MAGNESIUM: Magnesium: 2.2 mg/dL (ref 1.7–2.4)

## 2022-04-18 MED ORDER — SPIRONOLACTONE 12.5 MG HALF TABLET: 12.5000 mg | ORAL_TABLET | Freq: Every day | ORAL | Status: DC

## 2022-04-18 MED ORDER — APIXABAN 2.5 MG PO TABS: 2.5000 mg | ORAL_TABLET | Freq: Two times a day (BID) | ORAL | Status: AC

## 2022-04-18 MED ORDER — ALBUTEROL SULFATE (2.5 MG/3ML) 0.083% IN NEBU: 3.0000 mL | INHALATION_SOLUTION | Freq: Four times a day (QID) | RESPIRATORY_TRACT | 12 refills | Status: AC | PRN

## 2022-04-18 MED ORDER — METOPROLOL TARTRATE 25 MG PO TABS: 25.0000 mg | ORAL_TABLET | Freq: Two times a day (BID) | ORAL | Status: AC

## 2022-04-18 MED ORDER — SPIRONOLACTONE 25 MG PO TABS: 12.5000 mg | ORAL_TABLET | Freq: Every day | ORAL | Status: AC

## 2022-04-18 NOTE — Progress Notes (Addendum)
Patient to dc to [facility], EMS forms on chart. [facility] to call, no other dc needs.  TOC signing off.  [place], [place]

## 2022-04-18 NOTE — Progress Notes (Signed)
16 beat run of v tach. Vitals 100/68 map 79 HR104, resp 20, and afebrile. Patient is asymptomatic at this time. Sharion Settler NP notified. No orders at this time.

## 2022-04-18 NOTE — Progress Notes (Signed)
Approximately 02:45, Upon entry into patient's room, her O2  sat reading was between 79% to 83% on high flow ( 20L of O2 and at 40%).The O2 probe to nose was adjusted  without success. The probe was also placed on earlobe without success. Although patient appeared asymptomatic, this Probation officer notified the charge nurse as well as the previous nurse (who had this patient for the first four hours).  Patient lungs sounds were diminished  therefore at 02:58 this writer administered Albuterol neb  treatment with some positive effect. Together it was decided to call the RT first instead of the RRT. RT made adjustments and sat went up to 91% to 93%. Towards the end of the shift, this writer switched the O2 probe from patient's earlobe to her forehead. Reading improved to 94%-95%. Patient appeared to be in no distress and denied acute distress.

## 2022-04-18 NOTE — Care Management Important Message (Signed)
Important Message  Patient Details  Name: [name] MRN: [id number] Date of Birth: 11-30-1940   Medicare Important Message Given:  Other (see comment)  Transferring to [facility] home.  Medicare IM withheld at this time out of respect for patient and family.    Dannette Barbara 04/18/2022, 2:32 PM

## 2022-04-18 NOTE — Progress Notes (Addendum)
0330 Called by pt RN Parke Simmers) about pt heated high flow and pt SPO2. Explained to pt RN heated humidification was without error. Pt flow was increased to 40lpm. Pt SPO2 was 91%. Explained to RN SPO2 goal was 88% or greater. Called within the hour by ICU charge RN about concerns with pt SPO2 and heated high flow. Explained the pt situation to ICU charge RN, 2A charge RN and pt RN, [place] is working properly and SPO2 goal is >88%. Pt RN concerned about SPO2. HHFNC increased to 50% and 50lpm. Pt states she feels fine and in no apparent respiratory distress.   1947 Gradually decreased pt HFNC settings. Pt is now 35% at 30L, SPO2 91%. Pt is asleep and resting comfortably.

## 2022-04-18 NOTE — Progress Notes (Addendum)
[facility]) Hospital Liaison Note:   Sahiti Joswick has agreed to transfer to [facility] to continue the management of shortness of breath.  Daughter Anne Ng updated.  Patient will be transported via [facility].    RN please call report to [facility] [phone].  Please send completed and signed DNR.   Thank you for allowing [place] to participate in this patient's care.    Kenna Gilbert BSN, RN  [facility] Hospital Liaison  [phone]

## 2022-04-18 NOTE — Discharge Summary (Signed)
Physician Discharge Summary   Patient: [name] MRN: [id number] DOB: [birth year]  Admit date:     04/02/2022  Discharge date: 04/18/22  Discharge Physician: Loletha Grayer   PCP: Jinny Sanders, MD   Recommendations at discharge:   Follow-up team at hospice home 1 day  Discharge Diagnoses: Principal Problem:   Acute on chronic respiratory failure with hypoxia and hypercapnia (HCC) Active Problems:   Acute on chronic diastolic CHF (congestive heart failure) (HCC)   Pleural effusion, right   Hypothyroid   AKI (acute kidney injury) ([place])   Obstructive sleep apnea   Severe pulmonary hypertension (HCC)   Permanent atrial fibrillation (HCC)   Physical deconditioning   Thrombocytopenia (HCC)   HTN (hypertension)   Hypokalemia   Chronic atrial fibrillation with RVR (HCC)   Failure to thrive in adult   COPD (chronic obstructive pulmonary disease) [facility])   Hospital Course: [name] is a 81 y.o. female with medical history significant of severe COPD c/b chronic hypoxic and hypercapnic respiratory failure on 4 L [place] O2, severe pulmonary hypertension, systolic CHF, atrial fibrillation on Eliquis, peripheral artery disease, NSCLC s/p XRT/chemotherapy in remission, OSA not on CPAP, who presents to the ED with shortness of breath. She eventually required 8 L oxygen to maintain her oxygen saturation.  Required to go on heated high flow nasal cannula. She is treated with diuretics for CHF exacerbation. She also had a right-sided thoracentesis, removing 500 mL of fluids on 10/12.  Now on oral diuretics.  10/26.  Change the goal of oxygenation for pulse ox of 88% or above.  We are able to titrate down on the 50% heated high flow nasal cannula at 50 L flow down to 40% FiO2 on 20 L flow.  Continue to taper oxygen.  10/27.  Case discussed with hospice liaison.  They can do high flow nasal cannula 15 L at hospice facility can potentially go up to 23 L.  This morning patient was on high  flow nasal cannula 35% FiO2 and 10 L flow.  Patient will transport to hospice home on Ventimask and then have high flow nasal cannula at hospice home.  Assessment and Plan: * Acute on chronic respiratory failure with hypoxia and hypercapnia (HCC) Recent echocardiogram shows a normal EF with severe pulmonary hypertension. Patient had right thoracentesis. Currently diuresing with torsemide and Aldactone. Continue to taper oxygen as able to do soon.  Goal pulse ox greater than 88%.  Patient on heated high flow nasal cannula this morning 35% FiO2 10 L flow.  Case discussed with hospice liaison.  Can transfer on Ventimask 45% oxygen and go on high flow nasal cannula when she gets to her facility.  Acute on chronic diastolic CHF (congestive heart failure) (HCC) Continue torsemide, Aldactone and metoprolol  Pleural effusion, right Status postthoracentesis on 10/12 removing 500 mL of fluid  AKI (acute kidney injury) ([place]) Creatinine elevated at 1.55 on admission.  Creatinine 0.92 on 04/15/2022 and up to 1.19 on 04/16/2022.  Creatinine 1.11 on discharge.  Hypothyroid Continue Synthroid  Severe pulmonary hypertension (HCC) Secondary to COPD and chronic hypoxic respiratory failure.  Permanent atrial fibrillation (HCC) - Continue home digoxin and metoprolol.  Eliquis for anticoagulation.  Heart rate on the faster side but limited with hypotension.  Thrombocytopenia ([place]) Chronically low  Failure to thrive in adult Continue to monitor  Hypokalemia Replaced.         Consultants: Hospice Procedures performed: Thoracentesis Disposition: Hospice home Diet recommendation:  Regular DISCHARGE MEDICATION:  Allergies as of 04/18/2022   No Known Allergies      Medication List     STOP taking these medications    albuterol 108 (90 Base) MCG/ACT inhaler Commonly known as: VENTOLIN HFA Replaced by: albuterol (2.5 MG/3ML) 0.083% nebulizer solution   multivitamin with minerals Tabs  tablet   OXYGEN   potassium chloride SA 20 MEQ tablet Commonly known as: KLOR-CON M   Spiriva HandiHaler 18 MCG inhalation capsule Generic drug: tiotropium   Vitamin D 1000 units capsule       TAKE these medications    acetaminophen 500 MG tablet Commonly known as: TYLENOL Take 2 tablets (1,000 mg total) by mouth every 6 (six) hours as needed for fever, headache, mild pain or moderate pain (backpain).   albuterol (2.5 MG/3ML) 0.083% nebulizer solution Commonly known as: PROVENTIL Inhale 3 mLs into the lungs every 6 (six) hours as needed for wheezing or shortness of breath. Replaces: albuterol 108 (90 Base) MCG/ACT inhaler   apixaban 2.5 MG Tabs tablet Commonly known as: ELIQUIS Take 1 tablet (2.5 mg total) by mouth 2 (two) times daily. What changed:  medication strength how much to take   citalopram 20 MG tablet Commonly known as: CELEXA Take 20 mg by mouth daily.   digoxin 0.125 MG tablet Commonly known as: LANOXIN Take 0.5 tablets (0.0625 mg total) by mouth daily.   feeding supplement Liqd Take 237 mLs by mouth 2 (two) times daily between meals.   gabapentin 100 MG capsule Commonly known as: NEURONTIN Take 1 capsule by mouth 3 (three) times daily.   ipratropium-albuterol 0.5-2.5 (3) MG/3ML Soln Commonly known as: DUONEB Take 3 mLs by nebulization 2 (two) times daily.   levothyroxine 88 MCG tablet Commonly known as: SYNTHROID Take 88 mcg by mouth daily.   melatonin 5 MG Tabs Take 5 mg by mouth at bedtime.   metoprolol tartrate 25 MG tablet Commonly known as: LOPRESSOR Take 1 tablet (25 mg total) by mouth 2 (two) times daily. What changed:  how much to take how to take this when to take this additional instructions   morphine 15 MG tablet Commonly known as: MSIR Take 0.5 tablets (7.5 mg total) by mouth every 6 (six) hours as needed.   pantoprazole 40 MG tablet Commonly known as: PROTONIX Take 1 tablet (40 mg total) by mouth daily.    polyethylene glycol 17 g packet Commonly known as: MIRALAX / GLYCOLAX Take 17 g by mouth daily as needed.   spironolactone 25 MG tablet Commonly known as: ALDACTONE Take 0.5 tablets (12.5 mg total) by mouth daily. Start taking on: April 19, 2022   Torsemide 40 MG Tabs Take 40 mg by mouth 2 (two) times daily. What changed: Another medication with the same name was removed. Continue taking this medication, and follow the directions you see here.   vitamin B-12 100 MCG tablet Commonly known as: CYANOCOBALAMIN Take 100 mcg by mouth daily.        Follow-up Information     hospice home Follow up in 1 day(s).                 Discharge Exam: Filed Weights   04/02/22 1305 04/06/22 0500  Weight: 90.7 kg 106.1 kg   Physical Exam HENT:     Head: Normocephalic.     Mouth/Throat:     Pharynx: No oropharyngeal exudate.  Eyes:     General: Lids are normal.     Conjunctiva/sclera: Conjunctivae normal.  Cardiovascular:  Rate and Rhythm: Normal rate and regular rhythm.     Heart sounds: Normal heart sounds, S1 normal and S2 normal.  Pulmonary:     Breath sounds: Examination of the right-lower field reveals decreased breath sounds. Examination of the left-lower field reveals decreased breath sounds. Decreased breath sounds present. No wheezing, rhonchi or rales.  Abdominal:     Palpations: Abdomen is soft.     Tenderness: There is no abdominal tenderness.  Musculoskeletal:     Right lower leg: Swelling present.     Left lower leg: Swelling present.  Skin:    General: Skin is warm.     Findings: No rash.  Neurological:     Mental Status: She is alert and oriented to person, place, and time.      Condition at discharge: fair  The results of significant diagnostics from this hospitalization (including imaging, microbiology, ancillary and laboratory) are listed below for reference.   Imaging Studies: DG Chest Port 1 View  Result Date: 04/15/2022 CLINICAL DATA:   Hypoxia. EXAM: PORTABLE CHEST 1 VIEW COMPARISON:  04/07/2022 FINDINGS: 0647 hours. The cardio pericardial silhouette is enlarged. diffuse interstitial opacity persists. No substantial pleural effusion. Degenerative changes noted left shoulder. Telemetry leads overlie the chest. IMPRESSION: Cardiomegaly with diffuse interstitial lung disease. Imaging features suggest interstitial pulmonary edema. Electronically Signed   By: Misty Stanley M.D.   On: 04/15/2022 07:57   DG Chest Port 1 View  Result Date: 04/07/2022 CLINICAL DATA:  Hypoxia EXAM: PORTABLE CHEST 1 VIEW COMPARISON:  Chest x-ray dated April 03, 2022 FINDINGS: Unchanged cardiomegaly. Diffuse interstitial opacities, increased when compared with the prior. No large pleural effusion or pneumothorax. Advanced degenerative changes of the left shoulder. IMPRESSION: Increased bilateral interstitial opacities, likely due to worsening pulmonary edema. Electronically Signed   By: Yetta Glassman M.D.   On: 04/07/2022 11:37   DG Chest Port 1 View  Result Date: 04/03/2022 CLINICAL DATA:  Pleural effusion, status post thoracentesis EXAM: PORTABLE CHEST 1 VIEW COMPARISON:  05/03/2022 FINDINGS: Interval decrease in the size of a previously noted right pleural effusion, now trace. Unchanged cardiac and mediastinal contours. Redemonstrated diffuse interstitial prominence, unchanged, likely reflecting chronic lung disease. No pneumothorax. No acute osseous abnormality. Asymmetric advanced degenerative changes in the left shoulder. IMPRESSION: Interval decrease in the size of a previously noted right pleural effusion, now trace. No pneumothorax. Electronically Signed   By: Merilyn Baba M.D.   On: 04/03/2022 16:15   US THORACENTESIS ASP PLEURAL SPACE W/IMG GUIDE  Result Date: 04/03/2022 INDICATION: Patient with history of chronic AFib, chronic combined systolic and diastolic heart failure, pulmonary hypertension who presents with shortness of breath, pleural  effusions. Request made for diagnostic and therapeutic thoracentesis. EXAM: ULTRASOUND GUIDED DIAGNOSTIC AND THERAPEUTIC RIGHT THORACENTESIS MEDICATIONS: 10 ML 1% LIDOCAINE COMPLICATIONS: None immediate. PROCEDURE: An ultrasound guided thoracentesis was thoroughly discussed with the patient and questions answered. The benefits, risks, alternatives and complications were also discussed. The patient understands and wishes to proceed with the procedure. Written consent was obtained. Ultrasound was performed to localize and mark an adequate pocket of fluid in the right chest. The area was then prepped and draped in the normal sterile fashion. 1% Lidocaine was used for local anesthesia. Under ultrasound guidance a 6 Fr Safe-T-Centesis catheter was introduced. Thoracentesis was performed. The catheter was removed and a dressing applied. FINDINGS: A total of approximately 500 mL of amber, clear fluid was removed. Samples were sent to the laboratory as requested by the clinical team. IMPRESSION:  Successful ultrasound guided right thoracentesis yielding 500 mL of pleural fluid. Read by: Brynda Greathouse PA-C Electronically Signed   By: Markus Daft M.D.   On: 04/03/2022 15:42   US RENAL  Result Date: 04/02/2022 CLINICAL DATA:  Acute kidney injury EXAM: RENAL / URINARY TRACT ULTRASOUND COMPLETE COMPARISON:  CT 01/29/2022 FINDINGS: Right Kidney: Renal measurements: 10.3 by 4.6 x 4.4 cm = volume: 108 mL. Appears slightly echogenic. No mass or hydronephrosis. Left Kidney: Renal measurements: 10.8 x 5 x 4.1 cm = volume: 114 mL. Appears slightly echogenic. No mass or hydronephrosis Bladder: Appears normal for degree of bladder distention. Other: None. IMPRESSION: 1. Kidneys appear slightly echogenic suggesting medical renal disease. 2. Negative for hydronephrosis Electronically Signed   By: Donavan Foil M.D.   On: 04/02/2022 17:42   US Venous Img Lower Bilateral  Result Date: 04/02/2022 CLINICAL DATA:  Bilateral lower  extremity edema. EXAM: BILATERAL LOWER EXTREMITY VENOUS DOPPLER ULTRASOUND TECHNIQUE: Gray-scale sonography with graded compression, as well as color Doppler and duplex ultrasound were performed to evaluate the lower extremity deep venous systems from the level of the common femoral vein and including the common femoral, femoral, profunda femoral, popliteal and calf veins including the posterior tibial, peroneal and gastrocnemius veins when visible. The superficial great saphenous vein was also interrogated. Spectral Doppler was utilized to evaluate flow at rest and with distal augmentation maneuvers in the common femoral, femoral and popliteal veins. COMPARISON:  None Available. FINDINGS: RIGHT LOWER EXTREMITY Common Femoral Vein: No evidence of thrombus. Normal compressibility, respiratory phasicity and response to augmentation. Saphenofemoral Junction: No evidence of thrombus. Normal compressibility and flow on color Doppler imaging. Profunda Femoral Vein: No evidence of thrombus. Normal compressibility and flow on color Doppler imaging. Femoral Vein: No evidence of thrombus. Normal compressibility, respiratory phasicity and response to augmentation. Popliteal Vein: No evidence of thrombus. Normal compressibility, respiratory phasicity and response to augmentation. Calf Veins: No evidence of thrombus. Limited ability to compress calf veins. Superficial Great Saphenous Vein: No evidence of thrombus. Normal compressibility. Venous Reflux:  None. Other Findings: No evidence of superficial thrombophlebitis or abnormal fluid collection. LEFT LOWER EXTREMITY Common Femoral Vein: No evidence of thrombus. Normal compressibility, respiratory phasicity and response to augmentation. Saphenofemoral Junction: No evidence of thrombus. Normal compressibility and flow on color Doppler imaging. Profunda Femoral Vein: No evidence of thrombus. Normal compressibility and flow on color Doppler imaging. Femoral Vein: No evidence of  thrombus. Normal compressibility, respiratory phasicity and response to augmentation. Popliteal Vein: No evidence of thrombus. Normal compressibility, respiratory phasicity and response to augmentation. Calf Veins: No evidence of thrombus. Limited ability to compress calf veins. Superficial Great Saphenous Vein: No evidence of thrombus. Normal compressibility. Venous Reflux:  None. Other Findings: No evidence of superficial thrombophlebitis or abnormal fluid collection. IMPRESSION: No evidence of deep venous thrombosis in either lower extremity. Limited evaluation of bilateral calf veins due to limited ability to perform compression. Electronically Signed   By: Aletta Edouard M.D.   On: 04/02/2022 16:36   CT Angio Chest PE W and/or Wo Contrast  Result Date: 04/02/2022 CLINICAL DATA:  Shortness of breath for 4 days. EXAM: CT ANGIOGRAPHY CHEST WITH CONTRAST TECHNIQUE: Multidetector CT imaging of the chest was performed using the standard protocol during bolus administration of intravenous contrast. Multiplanar CT image reconstructions and MIPs were obtained to evaluate the vascular anatomy. RADIATION DOSE REDUCTION: This exam was performed according to the departmental dose-optimization program which includes automated exposure control, adjustment of the mA and/or kV according  to patient size and/or use of iterative reconstruction technique. CONTRAST:  46mL OMNIPAQUE IOHEXOL 350 MG/ML SOLN COMPARISON:  01/29/2022 FINDINGS: Cardiovascular: The heart is mildly enlarged but stable. Stable tortuosity, ectasia and calcification of the thoracic aorta. Stable large saccular aneurysm projecting off the descending thoracic aorta and measuring approximately 3.1 x 2.9 cm. This is stable. Stable three-vessel coronary artery calcifications. Reflux of contrast down the IVC and into the hepatic veins is likely due to tricuspid regurgitation. The pulmonary arterial tree is well opacified. No filling defects to suggest pulmonary  embolism. Mediastinum/Nodes: Stable scattered mediastinal and hilar lymph nodes but no mass or overt adenopathy. The esophagus is grossly normal. Lungs/Pleura: Moderate right-sided pleural effusion with overlying atelectasis. Very small left pleural effusion with left basilar atelectasis. Chronic emphysematous and pulmonary scarring changes. No pulmonary infiltrates or pulmonary edema. Upper Abdomen: No significant upper abdominal findings. Musculoskeletal: No breast masses axillary adenopathy. Stable right thyroid goiter. The bony thorax is intact. Stable osteoporosis and degenerative changes. Review of the MIP images confirms the above findings. IMPRESSION: 1. No CT findings for pulmonary embolism. 2. Stable tortuosity, ectasia and calcification of the thoracic aorta. Stable 3.4 cm saccular aneurysm projecting off the descending thoracic aorta. 3. Stable three-vessel coronary artery calcifications. 4. Moderate right-sided pleural effusion with overlying atelectasis. 5. Very small left pleural effusion with left basilar atelectasis. 6. Chronic emphysematous and pulmonary scarring changes. No significant acute overlying pulmonary process. 7. Aortic atherosclerosis. Aortic Atherosclerosis (ICD10-I70.0) and Emphysema (ICD10-J43.9). Electronically Signed   By: Marijo Sanes M.D.   On: 04/02/2022 15:40   DG Chest Port 1 View  Result Date: 04/02/2022 CLINICAL DATA:  Hypoxia, shortness of breath EXAM: PORTABLE CHEST 1 VIEW COMPARISON:  12/05/2021 FINDINGS: Small right pleural effusion. Cardiomegaly. Diffuse interstitial prominence, stable since prior study, likely reflecting chronic lung disease. Aortic atherosclerosis. No acute bony abnormality. IMPRESSION: Stable chronic cardiomegaly and chronic interstitial lung disease. Small right pleural effusion. Electronically Signed   By: Rolm Baptise M.D.   On: 04/02/2022 13:41    Microbiology: Results for orders placed or performed during the hospital encounter of  04/02/22  Culture, blood (single)     Status: None   Collection Time: 04/02/22  1:40 PM   Specimen: BLOOD  Result Value Ref Range Status   Specimen Description BLOOD RIGHT ANTECUBITAL  Final   Special Requests   Final    BOTTLES DRAWN AEROBIC ONLY Blood Culture adequate volume   Culture   Final    NO GROWTH 5 DAYS Performed at [facility], [address], [place], [place] [address]    Report Status 04/07/2022 FINAL  Final  Blood culture (single)     Status: None   Collection Time: 04/02/22  3:15 PM   Specimen: BLOOD  Result Value Ref Range Status   Specimen Description BLOOD BLOOD LEFT ARM  Final   Special Requests   Final    BOTTLES DRAWN AEROBIC AND ANAEROBIC Blood Culture results may not be optimal due to an excessive volume of blood received in culture bottles   Culture   Final    NO GROWTH 5 DAYS Performed at [facility], [address], [place], [place] [address]    Report Status 04/07/2022 FINAL  Final  SARS Coronavirus 2 by RT PCR (hospital order, performed in [facility] hospital lab) *cepheid single result test* Anterior Nasal Swab     Status: None   Collection Time: 04/03/22  8:45 AM   Specimen: Anterior Nasal Swab  Result  Value Ref Range Status   SARS Coronavirus 2 by RT PCR NEGATIVE NEGATIVE Final    Comment: (NOTE) SARS-CoV-2 target nucleic acids are NOT DETECTED.  The SARS-CoV-2 RNA is generally detectable in upper and lower respiratory specimens during the acute phase of infection. The lowest concentration of SARS-CoV-2 viral copies this assay can detect is 250 copies / mL. A negative result does not preclude SARS-CoV-2 infection and should not be used as the sole basis for treatment or other patient management decisions.  A negative result may occur with improper specimen collection / handling, submission of specimen other than nasopharyngeal swab, presence of viral mutation(s) within the areas targeted by this assay, and inadequate  number of viral copies (<250 copies / mL). A negative result must be combined with clinical observations, patient history, and epidemiological information.  Fact Sheet for Patients:   https://www.patel.info/  Fact Sheet for Healthcare Providers: https://hall.com/  This test is not yet approved or  cleared by the [place] FDA and has been authorized for detection and/or diagnosis of SARS-CoV-2 by FDA under an Emergency Use Authorization (EUA).  This EUA will remain in effect (meaning this test can be used) for the duration of the COVID-19 declaration under Section 564(b)(1) of the Act, 21 U.S.C. section 360bbb-3(b)(1), unless the authorization is terminated or revoked sooner.  Performed at [facility], [address] [place], [place] [address]   Body fluid culture w Gram Stain     Status: None   Collection Time: 04/03/22  3:40 PM   Specimen: PATH Cytology Pleural fluid  Result Value Ref Range Status   Specimen Description   Final    PLEURAL Performed at [facility], [address], [place], [place] [address]    Special Requests   Final    NONE Performed at [facility], [address] [place], [place] [address]    Gram Stain   Final    CYTOSPIN SMEAR WBC PRESENT, PREDOMINANTLY MONONUCLEAR NO ORGANISMS SEEN    Culture   Final    NO GROWTH 3 DAYS Performed at [facility], [place] [address] [place],  [address]    Report Status 04/07/2022 FINAL  Final    Labs: CBC: Recent Labs  Lab 04/16/22 0510  WBC 7.3  HGB 16.0*  HCT 49.7*  MCV 108.0*  PLT 527*   Basic Metabolic Panel: Recent Labs  Lab 04/12/22 0531 04/13/22 0515 04/15/22 0510 04/15/22 1148 04/16/22 0510 04/17/22 0528 04/18/22 0529  NA 140 137 139  --  141 136 135  K 3.7 3.7 2.9* 3.5 4.1 3.4* 3.7  CL 95* 89* 83*  --  86* 82* 83*  CO2 37* 40* 45*  --  44* 41* 39*  GLUCOSE 120* 102* 105*  --  109* 111* 122*   BUN 29* 28* 28*  --  32* 35* 37*  CREATININE 0.94 0.88 0.92  --  1.19* 1.07* 1.11*  CALCIUM 8.9 9.0 9.4  --  9.7 9.2 9.2  MG 2.0 2.0 1.9  --  2.1  --  2.2    CBG: Recent Labs  Lab 04/15/22 1559  GLUCAP 157*    Discharge time spent: greater than 30 minutes.  Signed: Loletha Grayer, MD [facility] 04/18/2022

## 2022-04-19 DIAGNOSIS — I714 Abdominal aortic aneurysm, without rupture, unspecified: Secondary | ICD-10-CM | POA: Diagnosis not present

## 2022-04-19 DIAGNOSIS — E785 Hyperlipidemia, unspecified: Secondary | ICD-10-CM | POA: Diagnosis not present

## 2022-04-19 DIAGNOSIS — K219 Gastro-esophageal reflux disease without esophagitis: Secondary | ICD-10-CM | POA: Diagnosis not present

## 2022-04-19 DIAGNOSIS — I509 Heart failure, unspecified: Secondary | ICD-10-CM | POA: Diagnosis not present

## 2022-04-19 DIAGNOSIS — J449 Chronic obstructive pulmonary disease, unspecified: Secondary | ICD-10-CM | POA: Diagnosis not present

## 2022-04-19 DIAGNOSIS — I110 Hypertensive heart disease with heart failure: Secondary | ICD-10-CM | POA: Diagnosis not present

## 2022-04-20 DIAGNOSIS — I714 Abdominal aortic aneurysm, without rupture, unspecified: Secondary | ICD-10-CM | POA: Diagnosis not present

## 2022-04-20 DIAGNOSIS — I110 Hypertensive heart disease with heart failure: Secondary | ICD-10-CM | POA: Diagnosis not present

## 2022-04-20 DIAGNOSIS — E785 Hyperlipidemia, unspecified: Secondary | ICD-10-CM | POA: Diagnosis not present

## 2022-04-20 DIAGNOSIS — I509 Heart failure, unspecified: Secondary | ICD-10-CM | POA: Diagnosis not present

## 2022-04-20 DIAGNOSIS — K219 Gastro-esophageal reflux disease without esophagitis: Secondary | ICD-10-CM | POA: Diagnosis not present

## 2022-04-20 DIAGNOSIS — J449 Chronic obstructive pulmonary disease, unspecified: Secondary | ICD-10-CM | POA: Diagnosis not present

## 2022-04-21 ENCOUNTER — Encounter (INDEPENDENT_AMBULATORY_CARE_PROVIDER_SITE_OTHER): Payer: Self-pay

## 2022-04-21 DIAGNOSIS — I509 Heart failure, unspecified: Secondary | ICD-10-CM | POA: Diagnosis not present

## 2022-04-21 DIAGNOSIS — K219 Gastro-esophageal reflux disease without esophagitis: Secondary | ICD-10-CM | POA: Diagnosis not present

## 2022-04-21 DIAGNOSIS — I714 Abdominal aortic aneurysm, without rupture, unspecified: Secondary | ICD-10-CM | POA: Diagnosis not present

## 2022-04-21 DIAGNOSIS — I110 Hypertensive heart disease with heart failure: Secondary | ICD-10-CM | POA: Diagnosis not present

## 2022-04-21 DIAGNOSIS — E785 Hyperlipidemia, unspecified: Secondary | ICD-10-CM | POA: Diagnosis not present

## 2022-04-21 DIAGNOSIS — J449 Chronic obstructive pulmonary disease, unspecified: Secondary | ICD-10-CM | POA: Diagnosis not present

## 2022-04-22 DIAGNOSIS — I110 Hypertensive heart disease with heart failure: Secondary | ICD-10-CM | POA: Diagnosis not present

## 2022-04-22 DIAGNOSIS — J449 Chronic obstructive pulmonary disease, unspecified: Secondary | ICD-10-CM | POA: Diagnosis not present

## 2022-04-22 DIAGNOSIS — K219 Gastro-esophageal reflux disease without esophagitis: Secondary | ICD-10-CM | POA: Diagnosis not present

## 2022-04-22 DIAGNOSIS — I714 Abdominal aortic aneurysm, without rupture, unspecified: Secondary | ICD-10-CM | POA: Diagnosis not present

## 2022-04-22 DIAGNOSIS — I509 Heart failure, unspecified: Secondary | ICD-10-CM | POA: Diagnosis not present

## 2022-04-22 DIAGNOSIS — E785 Hyperlipidemia, unspecified: Secondary | ICD-10-CM | POA: Diagnosis not present

## 2022-04-23 DIAGNOSIS — M1990 Unspecified osteoarthritis, unspecified site: Secondary | ICD-10-CM | POA: Diagnosis not present

## 2022-04-23 DIAGNOSIS — I4891 Unspecified atrial fibrillation: Secondary | ICD-10-CM | POA: Diagnosis not present

## 2022-04-23 DIAGNOSIS — I509 Heart failure, unspecified: Secondary | ICD-10-CM | POA: Diagnosis not present

## 2022-04-23 DIAGNOSIS — I110 Hypertensive heart disease with heart failure: Secondary | ICD-10-CM | POA: Diagnosis not present

## 2022-04-23 DIAGNOSIS — J449 Chronic obstructive pulmonary disease, unspecified: Secondary | ICD-10-CM | POA: Diagnosis not present

## 2022-04-23 DIAGNOSIS — I714 Abdominal aortic aneurysm, without rupture, unspecified: Secondary | ICD-10-CM | POA: Diagnosis not present

## 2022-04-23 DIAGNOSIS — E039 Hypothyroidism, unspecified: Secondary | ICD-10-CM | POA: Diagnosis not present

## 2022-04-23 DIAGNOSIS — K219 Gastro-esophageal reflux disease without esophagitis: Secondary | ICD-10-CM | POA: Diagnosis not present

## 2022-04-23 DIAGNOSIS — E785 Hyperlipidemia, unspecified: Secondary | ICD-10-CM | POA: Diagnosis not present

## 2022-04-23 DIAGNOSIS — F32A Depression, unspecified: Secondary | ICD-10-CM | POA: Diagnosis not present

## 2022-04-24 DIAGNOSIS — I110 Hypertensive heart disease with heart failure: Secondary | ICD-10-CM | POA: Diagnosis not present

## 2022-04-24 DIAGNOSIS — J449 Chronic obstructive pulmonary disease, unspecified: Secondary | ICD-10-CM | POA: Diagnosis not present

## 2022-04-24 DIAGNOSIS — K219 Gastro-esophageal reflux disease without esophagitis: Secondary | ICD-10-CM | POA: Diagnosis not present

## 2022-04-24 DIAGNOSIS — I714 Abdominal aortic aneurysm, without rupture, unspecified: Secondary | ICD-10-CM | POA: Diagnosis not present

## 2022-04-24 DIAGNOSIS — E785 Hyperlipidemia, unspecified: Secondary | ICD-10-CM | POA: Diagnosis not present

## 2022-04-24 DIAGNOSIS — I509 Heart failure, unspecified: Secondary | ICD-10-CM | POA: Diagnosis not present

## 2022-04-25 DIAGNOSIS — I110 Hypertensive heart disease with heart failure: Secondary | ICD-10-CM | POA: Diagnosis not present

## 2022-04-25 DIAGNOSIS — I509 Heart failure, unspecified: Secondary | ICD-10-CM | POA: Diagnosis not present

## 2022-04-25 DIAGNOSIS — E785 Hyperlipidemia, unspecified: Secondary | ICD-10-CM | POA: Diagnosis not present

## 2022-04-25 DIAGNOSIS — K219 Gastro-esophageal reflux disease without esophagitis: Secondary | ICD-10-CM | POA: Diagnosis not present

## 2022-04-25 DIAGNOSIS — I714 Abdominal aortic aneurysm, without rupture, unspecified: Secondary | ICD-10-CM | POA: Diagnosis not present

## 2022-04-25 DIAGNOSIS — J449 Chronic obstructive pulmonary disease, unspecified: Secondary | ICD-10-CM | POA: Diagnosis not present

## 2022-04-26 DIAGNOSIS — I110 Hypertensive heart disease with heart failure: Secondary | ICD-10-CM | POA: Diagnosis not present

## 2022-04-26 DIAGNOSIS — J449 Chronic obstructive pulmonary disease, unspecified: Secondary | ICD-10-CM | POA: Diagnosis not present

## 2022-04-26 DIAGNOSIS — K219 Gastro-esophageal reflux disease without esophagitis: Secondary | ICD-10-CM | POA: Diagnosis not present

## 2022-04-26 DIAGNOSIS — E785 Hyperlipidemia, unspecified: Secondary | ICD-10-CM | POA: Diagnosis not present

## 2022-04-26 DIAGNOSIS — I714 Abdominal aortic aneurysm, without rupture, unspecified: Secondary | ICD-10-CM | POA: Diagnosis not present

## 2022-04-26 DIAGNOSIS — I509 Heart failure, unspecified: Secondary | ICD-10-CM | POA: Diagnosis not present

## 2022-04-27 DIAGNOSIS — I714 Abdominal aortic aneurysm, without rupture, unspecified: Secondary | ICD-10-CM | POA: Diagnosis not present

## 2022-04-27 DIAGNOSIS — K219 Gastro-esophageal reflux disease without esophagitis: Secondary | ICD-10-CM | POA: Diagnosis not present

## 2022-04-27 DIAGNOSIS — I509 Heart failure, unspecified: Secondary | ICD-10-CM | POA: Diagnosis not present

## 2022-04-27 DIAGNOSIS — J449 Chronic obstructive pulmonary disease, unspecified: Secondary | ICD-10-CM | POA: Diagnosis not present

## 2022-04-27 DIAGNOSIS — E785 Hyperlipidemia, unspecified: Secondary | ICD-10-CM | POA: Diagnosis not present

## 2022-04-27 DIAGNOSIS — I110 Hypertensive heart disease with heart failure: Secondary | ICD-10-CM | POA: Diagnosis not present

## 2022-04-28 DIAGNOSIS — I509 Heart failure, unspecified: Secondary | ICD-10-CM | POA: Diagnosis not present

## 2022-04-28 DIAGNOSIS — K219 Gastro-esophageal reflux disease without esophagitis: Secondary | ICD-10-CM | POA: Diagnosis not present

## 2022-04-28 DIAGNOSIS — E785 Hyperlipidemia, unspecified: Secondary | ICD-10-CM | POA: Diagnosis not present

## 2022-04-28 DIAGNOSIS — J449 Chronic obstructive pulmonary disease, unspecified: Secondary | ICD-10-CM | POA: Diagnosis not present

## 2022-04-28 DIAGNOSIS — I714 Abdominal aortic aneurysm, without rupture, unspecified: Secondary | ICD-10-CM | POA: Diagnosis not present

## 2022-04-28 DIAGNOSIS — I110 Hypertensive heart disease with heart failure: Secondary | ICD-10-CM | POA: Diagnosis not present

## 2022-04-29 DIAGNOSIS — I110 Hypertensive heart disease with heart failure: Secondary | ICD-10-CM | POA: Diagnosis not present

## 2022-04-29 DIAGNOSIS — K219 Gastro-esophageal reflux disease without esophagitis: Secondary | ICD-10-CM | POA: Diagnosis not present

## 2022-04-29 DIAGNOSIS — E785 Hyperlipidemia, unspecified: Secondary | ICD-10-CM | POA: Diagnosis not present

## 2022-04-29 DIAGNOSIS — I714 Abdominal aortic aneurysm, without rupture, unspecified: Secondary | ICD-10-CM | POA: Diagnosis not present

## 2022-04-29 DIAGNOSIS — I509 Heart failure, unspecified: Secondary | ICD-10-CM | POA: Diagnosis not present

## 2022-04-29 DIAGNOSIS — J449 Chronic obstructive pulmonary disease, unspecified: Secondary | ICD-10-CM | POA: Diagnosis not present

## 2022-04-30 DIAGNOSIS — I509 Heart failure, unspecified: Secondary | ICD-10-CM | POA: Diagnosis not present

## 2022-04-30 DIAGNOSIS — I110 Hypertensive heart disease with heart failure: Secondary | ICD-10-CM | POA: Diagnosis not present

## 2022-04-30 DIAGNOSIS — K219 Gastro-esophageal reflux disease without esophagitis: Secondary | ICD-10-CM | POA: Diagnosis not present

## 2022-04-30 DIAGNOSIS — I714 Abdominal aortic aneurysm, without rupture, unspecified: Secondary | ICD-10-CM | POA: Diagnosis not present

## 2022-04-30 DIAGNOSIS — J449 Chronic obstructive pulmonary disease, unspecified: Secondary | ICD-10-CM | POA: Diagnosis not present

## 2022-04-30 DIAGNOSIS — E785 Hyperlipidemia, unspecified: Secondary | ICD-10-CM | POA: Diagnosis not present

## 2022-05-01 DIAGNOSIS — K219 Gastro-esophageal reflux disease without esophagitis: Secondary | ICD-10-CM | POA: Diagnosis not present

## 2022-05-01 DIAGNOSIS — E785 Hyperlipidemia, unspecified: Secondary | ICD-10-CM | POA: Diagnosis not present

## 2022-05-01 DIAGNOSIS — J449 Chronic obstructive pulmonary disease, unspecified: Secondary | ICD-10-CM | POA: Diagnosis not present

## 2022-05-01 DIAGNOSIS — I110 Hypertensive heart disease with heart failure: Secondary | ICD-10-CM | POA: Diagnosis not present

## 2022-05-01 DIAGNOSIS — I714 Abdominal aortic aneurysm, without rupture, unspecified: Secondary | ICD-10-CM | POA: Diagnosis not present

## 2022-05-01 DIAGNOSIS — I509 Heart failure, unspecified: Secondary | ICD-10-CM | POA: Diagnosis not present

## 2022-05-02 DIAGNOSIS — K219 Gastro-esophageal reflux disease without esophagitis: Secondary | ICD-10-CM | POA: Diagnosis not present

## 2022-05-02 DIAGNOSIS — E785 Hyperlipidemia, unspecified: Secondary | ICD-10-CM | POA: Diagnosis not present

## 2022-05-02 DIAGNOSIS — I714 Abdominal aortic aneurysm, without rupture, unspecified: Secondary | ICD-10-CM | POA: Diagnosis not present

## 2022-05-02 DIAGNOSIS — J449 Chronic obstructive pulmonary disease, unspecified: Secondary | ICD-10-CM | POA: Diagnosis not present

## 2022-05-02 DIAGNOSIS — I509 Heart failure, unspecified: Secondary | ICD-10-CM | POA: Diagnosis not present

## 2022-05-02 DIAGNOSIS — I110 Hypertensive heart disease with heart failure: Secondary | ICD-10-CM | POA: Diagnosis not present

## 2022-05-03 DIAGNOSIS — E785 Hyperlipidemia, unspecified: Secondary | ICD-10-CM | POA: Diagnosis not present

## 2022-05-03 DIAGNOSIS — J449 Chronic obstructive pulmonary disease, unspecified: Secondary | ICD-10-CM | POA: Diagnosis not present

## 2022-05-03 DIAGNOSIS — I714 Abdominal aortic aneurysm, without rupture, unspecified: Secondary | ICD-10-CM | POA: Diagnosis not present

## 2022-05-03 DIAGNOSIS — I110 Hypertensive heart disease with heart failure: Secondary | ICD-10-CM | POA: Diagnosis not present

## 2022-05-03 DIAGNOSIS — I509 Heart failure, unspecified: Secondary | ICD-10-CM | POA: Diagnosis not present

## 2022-05-03 DIAGNOSIS — K219 Gastro-esophageal reflux disease without esophagitis: Secondary | ICD-10-CM | POA: Diagnosis not present

## 2022-05-04 DIAGNOSIS — E785 Hyperlipidemia, unspecified: Secondary | ICD-10-CM | POA: Diagnosis not present

## 2022-05-04 DIAGNOSIS — J449 Chronic obstructive pulmonary disease, unspecified: Secondary | ICD-10-CM | POA: Diagnosis not present

## 2022-05-04 DIAGNOSIS — K219 Gastro-esophageal reflux disease without esophagitis: Secondary | ICD-10-CM | POA: Diagnosis not present

## 2022-05-04 DIAGNOSIS — I110 Hypertensive heart disease with heart failure: Secondary | ICD-10-CM | POA: Diagnosis not present

## 2022-05-04 DIAGNOSIS — I714 Abdominal aortic aneurysm, without rupture, unspecified: Secondary | ICD-10-CM | POA: Diagnosis not present

## 2022-05-04 DIAGNOSIS — I509 Heart failure, unspecified: Secondary | ICD-10-CM | POA: Diagnosis not present

## 2022-05-05 DIAGNOSIS — J449 Chronic obstructive pulmonary disease, unspecified: Secondary | ICD-10-CM | POA: Diagnosis not present

## 2022-05-05 DIAGNOSIS — I509 Heart failure, unspecified: Secondary | ICD-10-CM | POA: Diagnosis not present

## 2022-05-05 DIAGNOSIS — I714 Abdominal aortic aneurysm, without rupture, unspecified: Secondary | ICD-10-CM | POA: Diagnosis not present

## 2022-05-05 DIAGNOSIS — E785 Hyperlipidemia, unspecified: Secondary | ICD-10-CM | POA: Diagnosis not present

## 2022-05-05 DIAGNOSIS — K219 Gastro-esophageal reflux disease without esophagitis: Secondary | ICD-10-CM | POA: Diagnosis not present

## 2022-05-05 DIAGNOSIS — I110 Hypertensive heart disease with heart failure: Secondary | ICD-10-CM | POA: Diagnosis not present

## 2022-05-06 DIAGNOSIS — J449 Chronic obstructive pulmonary disease, unspecified: Secondary | ICD-10-CM | POA: Diagnosis not present

## 2022-05-06 DIAGNOSIS — E785 Hyperlipidemia, unspecified: Secondary | ICD-10-CM | POA: Diagnosis not present

## 2022-05-06 DIAGNOSIS — I509 Heart failure, unspecified: Secondary | ICD-10-CM | POA: Diagnosis not present

## 2022-05-06 DIAGNOSIS — I110 Hypertensive heart disease with heart failure: Secondary | ICD-10-CM | POA: Diagnosis not present

## 2022-05-06 DIAGNOSIS — I714 Abdominal aortic aneurysm, without rupture, unspecified: Secondary | ICD-10-CM | POA: Diagnosis not present

## 2022-05-06 DIAGNOSIS — K219 Gastro-esophageal reflux disease without esophagitis: Secondary | ICD-10-CM | POA: Diagnosis not present

## 2022-05-07 ENCOUNTER — Ambulatory Visit: Payer: Medicare Other | Admitting: Physician Assistant

## 2022-05-07 DIAGNOSIS — K219 Gastro-esophageal reflux disease without esophagitis: Secondary | ICD-10-CM | POA: Diagnosis not present

## 2022-05-07 DIAGNOSIS — I509 Heart failure, unspecified: Secondary | ICD-10-CM | POA: Diagnosis not present

## 2022-05-07 DIAGNOSIS — I110 Hypertensive heart disease with heart failure: Secondary | ICD-10-CM | POA: Diagnosis not present

## 2022-05-07 DIAGNOSIS — J449 Chronic obstructive pulmonary disease, unspecified: Secondary | ICD-10-CM | POA: Diagnosis not present

## 2022-05-07 DIAGNOSIS — E785 Hyperlipidemia, unspecified: Secondary | ICD-10-CM | POA: Diagnosis not present

## 2022-05-07 DIAGNOSIS — I714 Abdominal aortic aneurysm, without rupture, unspecified: Secondary | ICD-10-CM | POA: Diagnosis not present

## 2022-05-08 ENCOUNTER — Telehealth: Payer: Self-pay | Admitting: Pulmonary Disease

## 2022-05-08 DIAGNOSIS — J449 Chronic obstructive pulmonary disease, unspecified: Secondary | ICD-10-CM | POA: Diagnosis not present

## 2022-05-08 DIAGNOSIS — I714 Abdominal aortic aneurysm, without rupture, unspecified: Secondary | ICD-10-CM | POA: Diagnosis not present

## 2022-05-08 DIAGNOSIS — E785 Hyperlipidemia, unspecified: Secondary | ICD-10-CM | POA: Diagnosis not present

## 2022-05-08 DIAGNOSIS — I110 Hypertensive heart disease with heart failure: Secondary | ICD-10-CM | POA: Diagnosis not present

## 2022-05-08 DIAGNOSIS — K219 Gastro-esophageal reflux disease without esophagitis: Secondary | ICD-10-CM | POA: Diagnosis not present

## 2022-05-08 DIAGNOSIS — I509 Heart failure, unspecified: Secondary | ICD-10-CM | POA: Diagnosis not present

## 2022-05-08 NOTE — Telephone Encounter (Signed)
I spoke with Ander Purpura, she said the patient came to them after a hospital stay. She was on a Bubble High Flow machine when she came and is still on it. Since being at [facility] the patient is doing really well and is alert and oriented. Lauren said the patient is doing to good to stay at [facility] but they can not find a skilled nursing facility that will take her on the Bubble High Flow machine. She is asking for your thoughts on what can be done.   Per Dr. [name], she can not go to [facility] to do a consult. In order for her to come office the Bubble High Flow machine she will need a titration study. The only option would be to have her go back to the hospital and be reevaluated.   I notified Lauren and she said she will consult the team at [facility].  Nothing further needed.

## 2022-05-09 DIAGNOSIS — I110 Hypertensive heart disease with heart failure: Secondary | ICD-10-CM | POA: Diagnosis not present

## 2022-05-09 DIAGNOSIS — I714 Abdominal aortic aneurysm, without rupture, unspecified: Secondary | ICD-10-CM | POA: Diagnosis not present

## 2022-05-09 DIAGNOSIS — K219 Gastro-esophageal reflux disease without esophagitis: Secondary | ICD-10-CM | POA: Diagnosis not present

## 2022-05-09 DIAGNOSIS — E785 Hyperlipidemia, unspecified: Secondary | ICD-10-CM | POA: Diagnosis not present

## 2022-05-09 DIAGNOSIS — J449 Chronic obstructive pulmonary disease, unspecified: Secondary | ICD-10-CM | POA: Diagnosis not present

## 2022-05-09 DIAGNOSIS — I509 Heart failure, unspecified: Secondary | ICD-10-CM | POA: Diagnosis not present

## 2022-05-10 DIAGNOSIS — E785 Hyperlipidemia, unspecified: Secondary | ICD-10-CM | POA: Diagnosis not present

## 2022-05-10 DIAGNOSIS — I714 Abdominal aortic aneurysm, without rupture, unspecified: Secondary | ICD-10-CM | POA: Diagnosis not present

## 2022-05-10 DIAGNOSIS — K219 Gastro-esophageal reflux disease without esophagitis: Secondary | ICD-10-CM | POA: Diagnosis not present

## 2022-05-10 DIAGNOSIS — I509 Heart failure, unspecified: Secondary | ICD-10-CM | POA: Diagnosis not present

## 2022-05-10 DIAGNOSIS — I110 Hypertensive heart disease with heart failure: Secondary | ICD-10-CM | POA: Diagnosis not present

## 2022-05-10 DIAGNOSIS — J449 Chronic obstructive pulmonary disease, unspecified: Secondary | ICD-10-CM | POA: Diagnosis not present

## 2022-05-13 ENCOUNTER — Ambulatory Visit: Payer: Medicare Other | Admitting: Pulmonary Disease

## 2022-05-23 DEATH — deceased

## 2022-05-26 ENCOUNTER — Ambulatory Visit: Payer: Medicare Other | Admitting: Physician Assistant

## 2023-03-02 ENCOUNTER — Ambulatory Visit (INDEPENDENT_AMBULATORY_CARE_PROVIDER_SITE_OTHER): Payer: Medicare Other | Admitting: Vascular Surgery

## 2023-03-02 ENCOUNTER — Encounter (INDEPENDENT_AMBULATORY_CARE_PROVIDER_SITE_OTHER): Payer: Medicare Other
